# Patient Record
Sex: Male | Born: 1955 | Race: White | Hispanic: No | Smoking: Former smoker
Health system: Southern US, Community
[De-identification: ages and names within clinical notes are randomized; demographics above are authoritative.]

## PROBLEM LIST (undated history)

## (undated) ENCOUNTER — Emergency Department: Admission: EM | Payer: Medicare Other | Source: Home / Self Care

## (undated) DIAGNOSIS — Z955 Presence of coronary angioplasty implant and graft: Secondary | ICD-10-CM

## (undated) DIAGNOSIS — N289 Disorder of kidney and ureter, unspecified: Secondary | ICD-10-CM

## (undated) DIAGNOSIS — Z9889 Other specified postprocedural states: Secondary | ICD-10-CM

## (undated) DIAGNOSIS — E785 Hyperlipidemia, unspecified: Secondary | ICD-10-CM

## (undated) DIAGNOSIS — F32A Depression, unspecified: Secondary | ICD-10-CM

## (undated) DIAGNOSIS — I10 Essential (primary) hypertension: Secondary | ICD-10-CM

## (undated) DIAGNOSIS — I219 Acute myocardial infarction, unspecified: Secondary | ICD-10-CM

## (undated) HISTORY — PX: LIVER TRANSPLANT: SHX410

---

## 2003-02-20 ENCOUNTER — Other Ambulatory Visit: Payer: Self-pay

## 2005-08-11 ENCOUNTER — Ambulatory Visit: Payer: Self-pay | Admitting: Gastroenterology

## 2008-03-30 ENCOUNTER — Ambulatory Visit: Payer: Self-pay | Admitting: Gastroenterology

## 2009-06-02 ENCOUNTER — Ambulatory Visit: Payer: Self-pay | Admitting: Family Medicine

## 2009-06-16 ENCOUNTER — Ambulatory Visit: Payer: Self-pay | Admitting: Family Medicine

## 2012-12-24 ENCOUNTER — Ambulatory Visit: Payer: Self-pay | Admitting: Family Medicine

## 2013-10-21 ENCOUNTER — Ambulatory Visit: Payer: Self-pay | Admitting: Family Medicine

## 2014-05-19 ENCOUNTER — Ambulatory Visit: Payer: Self-pay | Admitting: Gastroenterology

## 2014-08-03 LAB — SURGICAL PATHOLOGY

## 2018-11-19 ENCOUNTER — Ambulatory Visit: Payer: BLUE CROSS/BLUE SHIELD

## 2018-11-19 DIAGNOSIS — Z96642 Presence of left artificial hip joint: Secondary | ICD-10-CM

## 2018-11-19 DIAGNOSIS — S42224A 2-part nondisplaced fracture of surgical neck of right humerus, initial encounter for closed fracture: Secondary | ICD-10-CM

## 2018-11-19 NOTE — Progress Notes
Date: 11/19/2018  Name: Travis Sherman   MRN#: [id number]   DOB: 1955-10-31   Age: 63 y.o.      Dr. Leane Call, DO    The patient was seen at the [facility] office.    CHIEF COMPLAINT: Status-post Left Hemiarthroplasty.     INTERIM HISTORY: Travis Sherman presents approximately 2 weeks post-operatively. his pain level today is a 3/10 in severity, intermittent in frequency, and dull in quality. He feels 90% improved compared to before. The patient denies fevers, chills or systemic complaints. The patient states they are taking their narcotics to control their pain. The patient is accompanied by his wife on today's visit.    Of note, the patient states he is scheduled bladder surgery on November 26, 2018.     ROS:  A COVID-19 questionnaire was filled out by the patient prior to the visit that included questions about having had a positive COVID-19 diagnosis in the last 14 days, contact with anybody diagnosed with  COVID-19 in the last 14 days, fever, headaches, unexplained muscle pain, weakness, diarrhea, nausea, vomiting, abdominal pain, respiratory illness/cough, shortness of breath, loss of smell, loss of taste, rash, skin irritation, unexplained hemorrhage, and fatigue. The responses to those questions were negative. A temperature was taken and it was less than 100 F.      PHYSICAL EXAM:  Constitutional: Travis Sherman is a 63 y.o. male in no acute distress.   Psyc: he is alert and oriented x3 with a normal mood and affect.  HENT: AT/NC; hearing intact  Eyes: PERRLA. Extra-ocular movements are intact.  Skin: Normal temperature. There are no rashes, ulcerations, or lesions.  Lymphatic: No induration or enlargement.  Lower Extremities:Mild Left lower extremity swelling. There is no calf tenderness with palpation. No clubbing. No cyanosis.  Gait:  []  Antalgic [x]  Normal []  Trendelenburg   Assistive devices: wheelchair    HIP EXAM   Left Hip Exam:   Surgical incision is healing well. There is no drainage, or fluid collection. Balance disturbance: []  Yes [x]  No  Snapping or clicking: []  Yes [x]  No  Erythema present: []  Yes [x]  No   Ecchymosis present: []  Yes [x]  No  Swelling present: []  Yes [x]  No  Palpable masses: []  Yes [x]  No   TTP: There is mild tenderness to palpation over the incision.     ROM(degrees/pain): There is no pain with ROM unless marked below.  Flexion  110???   []  with pain laterally. []  with pain in the groin.  Extension 0???    []  with pain laterally. []  with pain in the groin.  Abduction 30???  []  with pain laterally. []  with pain in the groin.  Adduction 20???  []  with pain laterally. []  with pain in the groin.  Internal rotation 15???  []  with pain laterally. []  with pain in the groin.  External rotation 35??? []  with pain laterally. []  with pain in the groin.  Motor Strength:   Hip flexion 4+/5  Hip extension 5/5   Leg abductors (Superior gluteal (L4-S1)) 4+/5  Knee extension (Femoral (L2-L4)) 5/5  Knee Flexion (Sciatic (L3-L4))  5/5  Neurovascular:   Sensation is normal throughout  Upper anterior thigh (L2) Normal  Lateral thigh (L2) Normal  Mid anterior thigh (L3) Normal  Knee, inside of leg (L4) Normal  Dorsum of foot (L5) Normal  Lateral border of foot (S1) Normal  Straight leg test:   []  Positive [x]  Negative []  Not Tested  Vascular   DP: Palpable  PT:  Palpable  Special Tests:   Impingement (FADDIR): []  Positive [x]  Negative []  Not Tested  Trendelenburg sign:  []  Positive [x]  Negative []  Not Tested    Radiographs:   Ap pelvis and two views of the left hip were ordered, taken, and interpreted by me from an orthopedic standpoint today. They reveal excellent alignment of components with no signs of loosening or other abnormalities.      IMPRESSION:    Status-post Left Hemiarthroplasty.     PLAN:   The patient is doing okay. his dressing was removed today. he can shower as normal but is to avoid any baths or soaks for the next four weeks. Daily ROM and strengthening exercises were encouraged today. he is to continue taking Aspirin for the 2 weeks. he is to continue anti-inflammatory medication, and no refill of their narcotic was given today. They are to wean off the narcotics.  Hip dislocation precautions were reinforced. The patient is encouraged to walk as much as tolerated. Antibiotic prophylaxis was strongly enforced.    He notes he is scheduled for bladder surgery in the next week.     Of note patient the patient was advised to follow up with Dr. Deloria Lair for his shoulders.     FOLLOW-UP: Return to clinic in 4 weeks with Dr. Leane Call for re-evaluation.    Thank you for allowing me to participate in Travis Sherman???s care. Please do not hesitate to contact me if you have any questions or concerns.         Leane Call, D.O.  Adult Reconstruction Specialist  ORTHOPEDIC SURGERY  11/19/2018

## 2018-11-20 ENCOUNTER — Ambulatory Visit: Payer: BLUE CROSS/BLUE SHIELD | Attending: Surgical

## 2018-11-20 DIAGNOSIS — S42291A Other displaced fracture of upper end of right humerus, initial encounter for closed fracture: Secondary | ICD-10-CM

## 2018-11-20 DIAGNOSIS — M25511 Pain in right shoulder: Secondary | ICD-10-CM

## 2018-11-20 NOTE — Progress Notes
DATE: 11/20/2018   NAME: Travis Sherman   MRN: [id number]   DOB: 1955/07/21   AGE: 63 y.o.       CHIEF COMPLAINT:   Chief Complaint   Patient presents with   ??? Right Shoulder - Pain       REFERRING PHYSICIAN: Maureen Ralphs, MD     HISTORY PRESENT ILLNESS: The patient is a 63 y.o. year old male who presents to the office for an evaluation of his right shoulder.  The patient fell on October 31, 2018. He was diagnosed with a left hip fracture and right shoulder fracture.  Surgery for the left hip was performed while he was at the hospital.  Non operative treatment for the right shoulder was initially recommended.  He has been using a sling for support.  He describes his shoulder pain as sharp and rates the severity of pain 6/10.  He has increased pain with reaching or lifting and associated popping.  He is trying to manage his symptoms with tramadol.    The patient does have comorbidities.  He has unstable sugar levels.  He is pending surgery for his prostate/bladder scheduled for next week on November 26, 2018. Also as mentioned above he is recovering from a left hemiarthroplasty following his injury on October 31, 2018. He is accompanied by his wife on today's visit.    ROS:    A COVID-19 questionnaire was filled out by the patient prior to the visit that included questions about having had a positive COVID-19 diagnosis in the last 14 days, contact with anybody diagnosed with  COVID-19 in the last 14 days, fever, headaches, unexplained muscle pain, weakness, diarrhea, nausea, vomiting, abdominal pain, respiratory illness/cough, shortness of breath, loss of smell, loss of taste, rash, skin irritation, unexplained hemorrhage and fatigue. The responses to those questions were negative. A temperature was taken and it was less than 100 F.      PREVIOUS MEDICAL HISTORY:   Past Medical History:   Diagnosis Date   ??? Diabetes mellitus (HCC/RAF)        PREVIOUS SURGICAL HISTORY:   Past Surgical History:   Procedure Laterality Date ??? heart fibulator     ??? heart stent     ??? SHOULDER SURGERY         MEDICATIONS:     Current Outpatient Medications:   ???  apixaban 5 mg tablet, 5 mg., Disp: , Rfl:   ???  aspirin (ASPIRIN 81) 81 mg EC tablet, 81 mg., Disp: , Rfl:   ???  atorvastatin 20 mg tablet, 20 mg., Disp: , Rfl:   ???  carvedilol 3.125 mg tablet, 3.125 mg., Disp: , Rfl:   ???  Docusate Sodium (COLACE PO), 50 mg., Disp: , Rfl:   ???  insulin glargine, 1 Unit Dial, (TOUJEO SOLOSTAR) 300 units/mL injection pen,  34 Unit, INJ, SUBCUT, qDay, Qty: 4.5 mL, 0, (3 pens x 1.5 ml), Maintenance, Disp: , Rfl:   ???  insulin glulisine (APIDRA) 100 units/mL injection vial,  10 Unit, INJ, SUBCUT, TID before meals, Qty: 10 mL, 0, Maintenance, Disp: , Rfl:   ???  levothyroxine 150 mcg tablet, 150 mcg., Disp: , Rfl:   ???  metFORMIN 1000 mg tablet, 1,000 mg., Disp: , Rfl:   ???  metOLazone 5 mg tablet, 5 mg., Disp: , Rfl:   ???  oxymetazoline 0.05% nasal spray, Spray 2 sprays in each nare two (2) times daily., Disp: , Rfl:   ???  TAMSULOSIN HCL PO, 0.4 mg.,  Disp: , Rfl:   ???  furosemide 40 mg tablet, take 1 tablet by mouth twice a day, Disp: , Rfl:   ???  potassium chloride 10 mEq CR tablet, take 1 tablet by mouth once daily, Disp: , Rfl:   ???  spironolactone 25 mg tablet, Take 1 tablet (25 mg) by mouth daily with food, Disp: , Rfl:     ALLERGIES: No Known Allergies    SOCIAL HISTORY:   Social History     Tobacco Use   ??? Smoking status: Current Every Day Smoker   ??? Smokeless tobacco: Never Used   Substance Use Topics   ??? Alcohol use: Yes     Comment: occasional   ??? Drug use: Not on file       FAMILY HISTORY:   Family History   Problem Relation Age of Onset   ??? Hypertension Mother    ??? Cancer Father        REVIEW OF SYSTEMS: The 14-point review of systems as documented today in the medical record is remarkable for the positive orthopedic problems discussed above and their relevance was considered with respect to Constitutional, ENT, Cardiovascular, GU, Skin, Neurologic, Endocrine, Hematologic, Psychiatric, Gastrointestinal, Respiratory, Eyes and Allergic/Immunologic systems.    EXAMINATION: Ht 6' 1'' (1.854 m)  ~ Wt 168 lb (76.2 kg)  ~ BMI 22.16 kg/m???      GENERAL: Alert, oriented, in no acute distress     PSYCH: Normal affect and mood    EENT: External exam of the eyes, ears, nose and mouth reveals no deformities, scars or lesions    PULMONARY: Respirations are regular and unlabored. Respiratory effort is normal with no evidence of intercostal retraction, or excessive use of accessory muscles    MUSCULOSKELATAL:    Right Shoulder:  Inspection:  []  Normal   []  Surgical scars []  Traumatic scars []  Arthroscopic scars   []  Symmetric shoulders []  Asymmetric shoulders   []  Biceps retracted   []  Swelling  [x]  Bruising []  AC joint deformity  []  Deltoid atrophy []  Supraspinatus fossa atrophy        []  Infraspinatus atrophy      Palpation: []   No tenderness [x]  Diffuse tenderness to palpation  []  AC Joint [] SC join                          []  Subacromial space   []  Anterior shoulder []  Posterior shoulder       []  Biceps tendon   []  Trapezius   []  Scapula/Periscapular  []  Warmth   []  Bogginess           []  Fluctuance                   []  AC joint ballottable      Range of motion exam:  Not tested  Forward elevation   ? Pain []  Present [x]  Absent     Abduction  ?  Pain []  Present [x]  Absent     External rotation at side  ?  Pain []  Present [x]  Absent     Internal rotation   at spinal level  Pain []  Present [x]  Absent    Crepitus is         []  Present [x]  Absent    External rotation @ 90?:  Not tested.   Internal rotation @ 90?: Not tested.  GIRD: Not tested.     Scapular Thoracic Motion:    [x]  Normal                      []   Medial winging                 []  Elevation with ROM   []  Asymmetric         Strength: []  Not tested secondary to pain  [x]  Not tested.   External rotation at side:  []  3/5 []  4-/5 [] 4/5 []  4+/5 []  5/5 []  with pain   Deltoid:  []  3/5 []  4-/5 [] 4/5 []  4+/5 []  5/5 []  with pain Subscap (Belly Press): []  3/5 []  4-/5 [] 4/5 []  4+/5 []  5/5 []  with pain    []  Drop arm                 []  External Rotation Lag          Provocative Tests:   []  PROVOCATIVE NOT TESTED DUE TO PAIN    Impingement:  Neer [] Positive [] Negative  [x] Not Tested  Hawkins [] Positive [] Negative  [x] Not Tested  Cross arm Adduction [] Positive [] Negative  [x] Not Tested   Speeds    [] Positive [] Negative  [x] Not Tested     Instability/Labrum:  DLS:    [] Positive [] Negative  [x] Not Tested   Active compression [] Positive [] Negative  [x] Not Tested      Load shift: [] Ant  [] Post [] Positive [] Negative  [x] Not Tested   Posterior stress [] Positive [] Negative  [x] Not Tested  Sulcus    [] Positive [] Negative  [x] Not Tested  Apprehension/Relocation [] Positive [] Negative  [x] Not Tested  Anterior/posterior drawer:  []  1+ []  2+ []  3+ []  Not Tested             NEUROLOGIC: No gross motor or sensory deficits     VASCULAR: Radial pulses are intact    SKIN: No rashes or lesions    RADIOGRAPHS:  Radiographs of the right shoulder including AP axillary outlet and acromioclavicular joint views ordered performed reviewed today in clinic.  These show a mildly displaced proximal humerus fracture across the surgical neck.    IMPRESSION:   1. Right shoulder proximal humerus fracture    MEDICAL DECISION MAKING: An extensive amount of time was spent reviewing radiographs, discussing diagnosis and treatment options with the patient and his wife.  His injury was October 31, 2018, approximately 3 weeks ago.  There is mild displacement of the proximal humerus fracture.  We will proceed with conservative treatment for the right shoulder fracture.  We discussed risks of nonunion and potential need for surgery.  He has multiple comorbidities, he is scheduled for prostate/bladder surgery scheduled for next week on November 26, 2018, and is recovering from a left hemiarthroplasty following a fracture. 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  He is concerned for the healing process of the right shoulder.  We discussed his options for surgery and non operative treatment.  The patient and his wife have agreed that the prostate/bladder surgery is priority at this time therefore we will proceed with conservative treatment on the right shoulder.      All of the patient???s questions and concerns were addressed.       FOLLOW UP: The patient will follow up in 7-10 days with new x-rays of the right shoulder, sooner as needed.  We will likely begin physical therapy after his follow-up appointment           The patient was examined and evaluated by Weldon Picking, PA-C for Hope Budds M.D. The evaluation and plan was reviewed and approved  by Hope Budds M.D.          _________________________________  Weldon Picking, PA-C  Hope Budds M.D.  ORTHOPEDIC SURGERY              CC: Maureen Ralphs, MD

## 2018-11-27 NOTE — Progress Notes
DATE: 12/02/2018   NAME: Travis Sherman   MRN: [id number]   DOB: 31-Oct-1955   AGE: 63 y.o.       Reason for visit: follow-up right shoulder    Interim History:  The patient returns today for reevaluation of the right shoulder. We are currently treating him conservatively for right shoulder proximal humerus fracture from an injury on October 31, 2018. At the time of the his injury he also fractured his hip and was treated with a hemiarthroplasty.  He states he is doing well in regards to the hip recovery.    We decided to proceed with conservative treatment and observation of the right proximal humerus fracture as he was pending surgery on his bladder/prostate.  Surgery was canceled due to hypertension and sepsis.  He was admitted to the hospital and treated with antibiotics.  He is likely going to proceed with the bladder/prostate surgery this week.    He continues to report right shoulder discomfort.      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  He is interested in his right shoulder surgical options    ROS: A COVID-19 questionnaire was filled out by the patient prior to the visit that included questions about having had a positive COVID-19 diagnosis in the last 14 days, contact with anybody diagnosed with  COVID-19 in the last 14 days, fever, headaches, unexplained muscle pain, weakness, diarrhea, nausea, vomiting, abdominal pain, respiratory illness/cough, shortness of breath, loss of smell, loss of taste, rash, skin irritation, unexplained hemorrhage and fatigue. The responses to those questions were negative. A temperature was taken and it was less than 100 F.    EXAMINATION:   There were no vitals taken for this visit.     GENERAL: Alert, oriented, in no acute distress     PSYCH: Normal affect and mood    EENT: External exam of the eyes, ears, nose and mouth reveals no deformities, scars or lesions PULMONARY: Respirations are regular and unlabored. Respiratory effort is normal with no evidence of intercostal retraction, or excessive use of accessory muscles    Right Shoulder:  Inspection:  [] ? Normal   [] ? Surgical scars                    [] ? Traumatic scars                 [] ? Arthroscopic scars   [] ? Symmetric shoulders         [] ? Asymmetric shoulders       [] ? Biceps retracted   [] ? Swelling                             [x] ? Bruising                             [] ? AC joint deformity  [] ? Deltoid atrophy                  [] ? Supraspinatus fossa atrophy          [] ? Infraspinatus atrophy    ???  Palpation: [] ?  No tenderness           [x] ? Diffuse tenderness to palpation  [] ? AC Joint                             [] ?  SC join                          [] ? Subacromial space   [] ? Anterior shoulder               [] ? Posterior shoulder       [] ? Biceps tendon   [] ? Trapezius                          [] ? Scapula/Periscapular  [] ? Warmth   [] ? Bogginess                         [] ? Fluctuance                   [] ? AC joint ballottable    ???  Range of motion exam:  Not tested  Forward elevation   ???                             Pain       [] ? Present  [x] ? Absent                                 Abduction  ???                                           Pain       [] ? Present  [x] ? Absent                      External rotation at side  ???                     Pain       [] ? Present  [x] ? Absent                                 Internal rotation   at spinal level             Pain       [] ? Present  [x] ? Absent  ???  Crepitus is         [] ? Present                  [x] ? Absent  ???  External rotation @ 90???:  Not tested.   Internal rotation @ 90???: Not tested.  GIRD: Not tested.   ???  Scapular Thoracic Motion:    [x] ? Normal                      [] ? Medial winging                 [] ? Elevation with ROM   [] ? Asymmetric       ???  Strength: [] ? Not tested secondary to pain  [x] ? Not tested. External rotation at side:         [] ? 3/5 [] ? 4-/5 [] ?4/5 [] ? 4+/5 [] ? 5/5 [] ? with pain   Deltoid:                                   [] ?  3/5 [] ? 4-/5 [] ?4/5 [] ? 4+/5 [] ? 5/5 [] ? with pain  Subscap [facility]):           [] ? 3/5 [] ? 4-/5 [] ?4/5 [] ? 4+/5 [] ? 5/5 [] ? with pain  ???  [] ? Drop arm                 [] ? External Rotation Lag        ???  Provocative Tests:   [] ? PROVOCATIVE NOT TESTED DUE TO PAIN    Impingement:  Neer                                        [] ?Positive       [] ?Negative     [x] ?Not Tested  Hawkins                                  [] ?Positive       [] ?Negative     [x] ?Not Tested  Cross arm Adduction              [] ?Positive       [] ?Negative     [x] ?Not Tested    Speeds                                    [] ?Positive       [] ?Negative     [x] ?Not Tested    ???  Instability/Labrum:  DLS:                                        [] ?Positive       [] ?Negative     [x] ?Not Tested   Active compression                [] ?Positive       [] ?Negative     [x] ?Not Tested      Load shift: [] ?Ant  [] ?Post      [] ?Positive       [] ?Negative     [x] ?Not Tested    Posterior stress                       [] ?Positive       [] ?Negative     [x] ?Not Tested  Sulcus                                     [] ?Positive       [] ?Negative     [x] ?Not Tested  Apprehension/Relocation       [] ?Positive       [] ?Negative     [x] ?Not Tested  Anterior/posterior drawer:       [] ? 1+   [] ? 2+   [] ? 3+   [] ? Not Tested           ???  NEUROLOGIC: No gross motor or sensory deficits    ???  VASCULAR: Radial pulses are intact  ???  SKIN: No rashes or lesions      X-Rays: Radiographs of the right shoulder including AP axillary outlet and acromioclavicular joint views ordered performed reviewed today in clinic.  These show a displaced proximal humerus fracture.  The fracture is further displaced in comparison to previous x-rays      Impression:  1. Right shoulder proximal humerus fracture, displaced Plan:  He I reviewed the diagnosis and treatment options with the patient and his wife.  He unfortunately has a displaced proximal humerus fracture which has further displaced in comparison to previous x-rays.  We discussed her options for surgery with open reduction internal fixation.  Unfortunately the patient is pending bladder/prostate surgery and was currently in the hospital for sepsis and hypertension.  The patient will need to have appropriate medical clearance.  The patient is expected to proceed with the bladder/prostate surgery this week and at this point this is priority.    We will proceed with right shoulder open reduction internal fixation of the proximal humerus fracture he after his bladder surgery and pending appropriate medical clearance.  We discussed risks, benefits, and recovery of this type of surgery.  All of his questions have been answered.    Follow-up at his preoperative appointment            _____________________   Shawnie Dapper. Nile Riggs, M.D.  Sports Medicine/Shoulder and Elbow Reconstuction  Orthopedic Surgery                _________________________________  Weldon Picking, PA-C  Hope Budds M.D.  ORTHOPEDIC SURGERY              CC: Maureen Ralphs, MD

## 2018-12-02 ENCOUNTER — Ambulatory Visit: Payer: BLUE CROSS/BLUE SHIELD | Attending: Surgical

## 2018-12-02 DIAGNOSIS — S42291A Other displaced fracture of upper end of right humerus, initial encounter for closed fracture: Secondary | ICD-10-CM

## 2018-12-24 ENCOUNTER — Ambulatory Visit: Payer: BLUE CROSS/BLUE SHIELD | Attending: Medical

## 2018-12-30 ENCOUNTER — Ambulatory Visit: Payer: BLUE CROSS/BLUE SHIELD | Attending: Medical

## 2019-01-10 ENCOUNTER — Ambulatory Visit: Payer: BLUE CROSS/BLUE SHIELD | Attending: Medical

## 2019-01-21 ENCOUNTER — Ambulatory Visit: Payer: BLUE CROSS/BLUE SHIELD | Attending: Medical

## 2019-01-22 NOTE — Progress Notes
Date: 01/21/2019  Name: Karlis Cregg   MRN#: [id number]   DOB: November 30, 1955   Age: 63 y.o.      Dr. Leane Call, DO    The patient was seen at the [facility] office.    CHIEF COMPLAINT: Status-post Left Hemiarthroplasty.     INTERIM HISTORY: Russel Morain presents approximately 2 weeks post-operatively. his pain level today is a 0/10 in severity, intermittent in frequency, and dull in quality. He feels 100% improved compared to before. The patient denies fevers, chills or systemic complaints. The patient states they are not taking their narcotics to control their pain. The patient is accompanied by his wife on today's visit.      ROS:  A COVID-19 questionnaire was filled out by the patient prior to the visit that included questions about having had a positive COVID-19 diagnosis in the last 14 days, contact with anybody diagnosed with  COVID-19 in the last 14 days, fever, headaches, unexplained muscle pain, weakness, diarrhea, nausea, vomiting, abdominal pain, respiratory illness/cough, shortness of breath, loss of smell, loss of taste, rash, skin irritation, unexplained hemorrhage, and fatigue. The responses to those questions were negative. A temperature was taken and it was less than 100 F.      PHYSICAL EXAM:  Constitutional: Feliz Lincoln is a 63 y.o. male in no acute distress.   Psyc: he is alert and oriented x3 with a normal mood and affect.  HENT: AT/NC; hearing intact  Eyes: PERRLA. Extra-ocular movements are intact.  Skin: Normal temperature. There are no rashes, ulcerations, or lesions.  Lymphatic: No induration or enlargement.  Lower Extremities:Mild Left lower extremity swelling. There is no calf tenderness with palpation. No clubbing. No cyanosis.  Gait:  []  Antalgic [x]  Normal []  Trendelenburg   Assistive devices: wheelchair    HIP EXAM   Left Hip Exam:   Surgical incision is healing well. There is no drainage, or fluid collection.   Balance disturbance: []  Yes [x]  No  Snapping or clicking: []  Yes [x]  No Erythema present: []  Yes [x]  No   Ecchymosis present: []  Yes [x]  No  Swelling present: []  Yes [x]  No  Palpable masses: []  Yes [x]  No   TTP: There is mild tenderness to palpation over the incision.     ROM(degrees/pain): There is no pain with ROM unless marked below.  Flexion  110?   []  with pain laterally. []  with pain in the groin.  Extension 0?    []  with pain laterally. []  with pain in the groin.  Abduction 30?  []  with pain laterally. []  with pain in the groin.  Adduction 20?  []  with pain laterally. []  with pain in the groin.  Internal rotation 15?  []  with pain laterally. []  with pain in the groin.  External rotation 35? []  with pain laterally. []  with pain in the groin.  Motor Strength:   Hip flexion 4+/5  Hip extension 5/5   Leg abductors (Superior gluteal (L4-S1)) 4+/5  Knee extension (Femoral (L2-L4)) 5/5  Knee Flexion (Sciatic (L3-L4))  5/5  Neurovascular:   Sensation is normal throughout  Upper anterior thigh (L2) Normal  Lateral thigh (L2) Normal  Mid anterior thigh (L3) Normal  Knee, inside of leg (L4) Normal  Dorsum of foot (L5) Normal  Lateral border of foot (S1) Normal  Straight leg test:   []  Positive [x]  Negative []  Not Tested  Vascular   DP: Palpable  PT: Palpable  Special Tests:   Impingement (FADDIR): []  Positive [x]  Negative []  Not  Tested  Trendelenburg sign:  []  Positive [x]  Negative []  Not Tested    Radiographs:   Not obtained today.     IMPRESSION:    Status-post Left Hemiarthroplasty.     PLAN:   The patient is doing well.  Daily ROM and strengthening exercises were encouraged today.     Hip dislocation precautions were reinforced. The patient is encouraged to walk as much as tolerated. Antibiotic prophylaxis was strongly enforced.    Of note patient the patient was advised to follow up with his shoulder specialist.     FOLLOW-UP: Return to clinic in 1 year with Dr. Leane Call for re-evaluation and yearly hip x rays Thank you for allowing me to participate in Ramar Slatten?s care. Please do not hesitate to contact me if you have any questions or concerns.         Leane Call, D.O.  Adult Reconstruction Specialist  ORTHOPEDIC SURGERY  01/21/2019

## 2019-05-18 ENCOUNTER — Ambulatory Visit: Payer: BLUE CROSS/BLUE SHIELD

## 2019-05-18 DIAGNOSIS — Z23 Encounter for immunization: Secondary | ICD-10-CM

## 2019-10-11 ENCOUNTER — Emergency Department
Admission: EM | Admit: 2019-10-11 | Discharge: 2019-10-11 | Disposition: A | Discharge status: Home / Self Care | Payer: Medicare Other | Attending: Emergency Medicine | Admitting: Emergency Medicine

## 2019-10-11 ENCOUNTER — Emergency Department: Payer: Medicare Other

## 2019-10-11 ENCOUNTER — Other Ambulatory Visit: Payer: Self-pay

## 2019-10-11 ENCOUNTER — Encounter: Payer: Self-pay | Admitting: *Deleted

## 2019-10-11 DIAGNOSIS — Y929 Unspecified place or not applicable: Secondary | ICD-10-CM | POA: Insufficient documentation

## 2019-10-11 DIAGNOSIS — F17200 Nicotine dependence, unspecified, uncomplicated: Secondary | ICD-10-CM | POA: Insufficient documentation

## 2019-10-11 DIAGNOSIS — S42352A Displaced comminuted fracture of shaft of humerus, left arm, initial encounter for closed fracture: Secondary | ICD-10-CM

## 2019-10-11 DIAGNOSIS — W19XXXA Unspecified fall, initial encounter: Secondary | ICD-10-CM

## 2019-10-11 DIAGNOSIS — I10 Essential (primary) hypertension: Secondary | ICD-10-CM | POA: Insufficient documentation

## 2019-10-11 DIAGNOSIS — Z79899 Other long term (current) drug therapy: Secondary | ICD-10-CM | POA: Diagnosis not present

## 2019-10-11 DIAGNOSIS — R270 Ataxia, unspecified: Secondary | ICD-10-CM | POA: Insufficient documentation

## 2019-10-11 DIAGNOSIS — S4992XA Unspecified injury of left shoulder and upper arm, initial encounter: Secondary | ICD-10-CM | POA: Diagnosis present

## 2019-10-11 DIAGNOSIS — Y999 Unspecified external cause status: Secondary | ICD-10-CM | POA: Insufficient documentation

## 2019-10-11 DIAGNOSIS — Z7982 Long term (current) use of aspirin: Secondary | ICD-10-CM | POA: Insufficient documentation

## 2019-10-11 DIAGNOSIS — Y939 Activity, unspecified: Secondary | ICD-10-CM | POA: Insufficient documentation

## 2019-10-11 HISTORY — DX: Hyperlipidemia, unspecified: E78.5

## 2019-10-11 HISTORY — DX: Acute myocardial infarction, unspecified: I21.9

## 2019-10-11 HISTORY — DX: Other specified postprocedural states: Z98.890

## 2019-10-11 HISTORY — DX: Presence of coronary angioplasty implant and graft: Z95.5

## 2019-10-11 HISTORY — DX: Depression, unspecified: F32.A

## 2019-10-11 HISTORY — DX: Essential (primary) hypertension: I10

## 2019-10-11 MED ORDER — OXYCODONE-ACETAMINOPHEN 5-325 MG PO TABS: 1.0000 | ORAL_TABLET | ORAL | 0 refills | Status: DC | PRN

## 2019-10-11 NOTE — ED Provider Notes (Addendum)
[facility] Emergency Department Provider Note   ____________________________________________   First MD Initiated Contact with Patient 10/11/19 1338     (approximate)  I have reviewed the triage vital signs and the nursing notes.   HISTORY  Chief Complaint Fall    HPI Alex Hampton is a 64 y.o. male patient arrived via EMS secondary to mechanical fall this morning.  Patient states he has been having increased balance problems with standing and ambulation.  Patient complaint of pain to the left upper arm.  Patient denies loss of sensation.  Patient denies headache or LOC.         Past Medical History:  Diagnosis Date  . Depression   . History of cardiac cath   . History of heart artery stent   . Hyperlipidemia   . Hypertension   . MI (myocardial infarction) ([place])     There are no problems to display for this patient.   History reviewed. No pertinent surgical history.  Prior to Admission medications   Medication Sig Start Date End Date Taking? Authorizing Provider  aspirin 81 MG EC tablet Take by mouth.   Yes [provider]  buPROPion (WELLBUTRIN XL) 150 MG 24 hr tablet Take by mouth. 09/18/18  Yes [provider]  ezetimibe-simvastatin (VYTORIN) 10-80 MG tablet TAKE 1 TABLET BY MOUTH EVERY DAY AT NIGHT 01/20/19  Yes [provider]  FLUoxetine (PROZAC) 10 MG capsule Take by mouth. 09/18/18  Yes [provider]  fluticasone (FLONASE) 50 MCG/ACT nasal spray SPRAY 2 SPRAYS INTO EACH NOSTRIL EVERY DAY 09/30/18  Yes [provider]  hydrocortisone 2.5 % lotion hydrocortisone 2.5 % lotion  APPLY TO AFFECTED AREA UP TO 3 TIMES A WEEK MONDAY,WEDNESDAY, AND FRIDAY   Yes [provider]  lisinopril (ZESTRIL) 10 MG tablet Take by mouth. 09/18/18  Yes [provider]  meloxicam (MOBIC) 15 MG tablet TAKE 1 TABLET BY MOUTH DAILY WITH MEALS 10/15/15  Yes [provider]  metoprolol  tartrate (LOPRESSOR) 100 MG tablet Take by mouth. 09/18/18  Yes [provider]  montelukast (SINGULAIR) 10 MG tablet Take 1 tablet by mouth daily. 01/20/19  Yes [provider]  traZODone (DESYREL) 150 MG tablet Take 75 mg by mouth at bedtime. 09/15/19  Yes [provider]  oxyCODONE-acetaminophen (PERCOCET) 5-325 MG tablet Take 1 tablet by mouth every 4 (four) hours as needed for severe pain. 10/11/19 10/10/20  Sable Feil, PA-C    Allergies Patient has no known allergies.  No family history on file.  Social History Social History   Tobacco Use  . Smoking status: Current Some Day Smoker  . Smokeless tobacco: Never Used  Substance Use Topics  . Alcohol use: Yes    Comment: occasionally  . Drug use: Never    Review of Systems Constitutional: No fever/chills Eyes: No visual changes. ENT: No sore throat. Cardiovascular: Denies chest pain. Respiratory: Denies shortness of breath. Gastrointestinal: No abdominal pain.  No nausea, no vomiting.  No diarrhea.  No constipation. Genitourinary: Negative for dysuria. Musculoskeletal: Left shoulder pain Skin: Negative for rash. Neurological: Negative for headaches, focal weakness or numbness. Psychiatric:  Depression Endocrine:  Hyperlipidemia and hypertension  ____________________________________________   PHYSICAL EXAM:  VITAL SIGNS: ED Triage Vitals  Enc Vitals Group     BP 10/11/19 1340 122/68     Pulse Rate 10/11/19 1340 (!) 54     Resp 10/11/19 1340 16     Temp 10/11/19 1340 97.8 F (  36.6 C)     Temp Source 10/11/19 1340 Oral     SpO2 10/11/19 1340 99 %     Weight 10/11/19 1341 232 lb (105.2 kg)     Height 10/11/19 1341 6\' 1"  (1.854 m)     Head Circumference --      Peak Flow --      Pain Score 10/11/19 1340 3     Pain Loc --      Pain Edu? --      Excl. in [place]? --     Constitutional: Alert and oriented. Well appearing and in no acute distress. Hematological/Lymphatic/Immunilogical: No  cervical lymphadenopathy. Cardiovascular: Bradycardic, regular rhythm. Grossly normal heart sounds.  Good peripheral circulation. Respiratory: Normal respiratory effort.  No retractions. Lungs CTAB. Musculoskeletal: No lower extremity tenderness nor edema.  No joint effusions. Neurologic:  Normal speech and language. No gross focal neurologic deficits are appreciated. No gait instability. Skin:  Skin is warm, dry and intact. No rash noted. Psychiatric: Mood and affect are normal. Speech and behavior are normal.  ____________________________________________   LABS (all labs ordered are listed, but only abnormal results are displayed)  Labs Reviewed - No data to display ____________________________________________  EKG EKG reviewed read by heart station Doctor With no acute findings.  ____________________________________________  RADIOLOGY  ED MD interpretation:    Official radiology report(s): CT Head Wo Contrast  Result Date: 10/11/2019 CLINICAL DATA:  Ataxia.  Assess for stroke. EXAM: CT HEAD WITHOUT CONTRAST TECHNIQUE: Contiguous axial images were obtained from the base of the skull through the vertex without intravenous contrast. COMPARISON:  None. FINDINGS: Brain: No evidence of acute infarction, hemorrhage, hydrocephalus, extra-axial collection or mass lesion/mass effect. Vascular: No hyperdense vessel is noted. Skull: Normal. Negative for fracture or focal lesion. Sinuses/Orbits: No acute finding. Other: None. IMPRESSION: No focal acute intracranial abnormality identified. Electronically Signed   By: Abelardo Diesel M.D.   On: 10/11/2019 14:44   DG Humerus Left  Result Date: 10/11/2019 CLINICAL DATA:  Status post fall this morning with left arm pain EXAM: LEFT HUMERUS - 2+ VIEW COMPARISON:  None. FINDINGS: Comminuted displaced fracture of the proximal and mid left humerus are noted. The visualized lung fields are clear. There is no dislocation. IMPRESSION: Comminuted displaced  fracture of the proximal and mid left humerus. Electronically Signed   By: Abelardo Diesel M.D.   On: 10/11/2019 14:46    ____________________________________________   PROCEDURES  Procedure(s) performed (including Critical Care):  Procedures   ____________________________________________   INITIAL IMPRESSION / ASSESSMENT AND PLAN / ED COURSE  As part of my medical decision making, I reviewed the following data within the [place]     Patient presents with left humeral pain secondary to mechanical fall.  Discussed x-ray/CT  Findings with patient revealing a comminuted fracture of the proximal and mid humerus.  Discussed x-ray findings with orthopedics recommend shoulder immobilizer and follow-up in orthopedic clinic.  Patient given discharge care instructions and will follow orthopedics.   Alex Hampton was evaluated in Emergency Department on 10/11/2019 for the symptoms described in the history of present illness. He was evaluated in the context of the global COVID-19 pandemic, which necessitated consideration that the patient might be at risk for infection with the SARS-CoV-2 virus that causes COVID-19. Institutional protocols and algorithms that pertain to the evaluation of patients at risk for COVID-19 are in a state of rapid change based on information released by regulatory bodies including the CDC and federal and  state organizations. These policies and algorithms were followed during the patient's care in the ED.       ____________________________________________   FINAL CLINICAL IMPRESSION(S) / ED DIAGNOSES  Final diagnoses:  Closed displaced comminuted fracture of shaft of left humerus, initial encounter     ED Discharge Orders         Ordered    oxyCODONE-acetaminophen (PERCOCET) 5-325 MG tablet  Every 4 hours PRN     Discontinue  Reprint     10/11/19 1543           Note:  This document was prepared using Dragon voice recognition software and  may include unintentional dictation errors.    Sable Feil, PA-C 10/11/19 1547    Sable Feil, PA-C 10/11/19 1550    Vanessa Anon Raices, MD 10/12/19 [phone]

## 2019-10-11 NOTE — ED Triage Notes (Addendum)
Per EMS report, patient had a mechanical fall this morning after tripping. Patient has a history of balance problems. Patient has a deformed humerus fracture. Patient denies LOC or other injuries. Patient was given 4mg  Zofran and 176mcg Fentanyl en route.

## 2019-10-11 NOTE — ED Provider Notes (Signed)
EKG is reviewed interpreted by me at 1203 Heart rate 50 QRS 99 QTc 460 Normal sinus rhythm, no evidence of acute ischemia or ectopy.   Delman Kitten, MD 10/11/19 1414

## 2019-10-11 NOTE — ED Notes (Signed)
Smart watch taken off left wrist and given to patient who put it in his pocket

## 2019-10-16 ENCOUNTER — Inpatient Hospital Stay
Admission: EM | Admit: 2019-10-16 | Discharge: 2019-10-23 | DRG: 057 | Disposition: A | Discharge status: Skilled Nursing Facility | Payer: Medicare Other | Attending: Internal Medicine | Admitting: Internal Medicine

## 2019-10-16 ENCOUNTER — Emergency Department: Payer: Medicare Other

## 2019-10-16 ENCOUNTER — Other Ambulatory Visit: Payer: Self-pay

## 2019-10-16 DIAGNOSIS — F419 Anxiety disorder, unspecified: Secondary | ICD-10-CM | POA: Diagnosis present

## 2019-10-16 DIAGNOSIS — I252 Old myocardial infarction: Secondary | ICD-10-CM

## 2019-10-16 DIAGNOSIS — Z9181 History of falling: Secondary | ICD-10-CM

## 2019-10-16 DIAGNOSIS — R410 Disorientation, unspecified: Secondary | ICD-10-CM | POA: Diagnosis not present

## 2019-10-16 DIAGNOSIS — G238 Other specified degenerative diseases of basal ganglia: Principal | ICD-10-CM | POA: Diagnosis present

## 2019-10-16 DIAGNOSIS — Z9119 Patient's noncompliance with other medical treatment and regimen: Secondary | ICD-10-CM

## 2019-10-16 DIAGNOSIS — R269 Unspecified abnormalities of gait and mobility: Secondary | ICD-10-CM

## 2019-10-16 DIAGNOSIS — F32A Depression, unspecified: Secondary | ICD-10-CM

## 2019-10-16 DIAGNOSIS — R748 Abnormal levels of other serum enzymes: Secondary | ICD-10-CM | POA: Diagnosis present

## 2019-10-16 DIAGNOSIS — S42422A Displaced comminuted supracondylar fracture without intercondylar fracture of left humerus, initial encounter for closed fracture: Secondary | ICD-10-CM | POA: Diagnosis present

## 2019-10-16 DIAGNOSIS — G629 Polyneuropathy, unspecified: Secondary | ICD-10-CM | POA: Diagnosis present

## 2019-10-16 DIAGNOSIS — R443 Hallucinations, unspecified: Secondary | ICD-10-CM

## 2019-10-16 DIAGNOSIS — Z79899 Other long term (current) drug therapy: Secondary | ICD-10-CM

## 2019-10-16 DIAGNOSIS — Z7982 Long term (current) use of aspirin: Secondary | ICD-10-CM

## 2019-10-16 DIAGNOSIS — G4733 Obstructive sleep apnea (adult) (pediatric): Secondary | ICD-10-CM

## 2019-10-16 DIAGNOSIS — K7460 Unspecified cirrhosis of liver: Secondary | ICD-10-CM

## 2019-10-16 DIAGNOSIS — R7989 Other specified abnormal findings of blood chemistry: Secondary | ICD-10-CM | POA: Diagnosis present

## 2019-10-16 DIAGNOSIS — W19XXXA Unspecified fall, initial encounter: Secondary | ICD-10-CM | POA: Diagnosis present

## 2019-10-16 DIAGNOSIS — Z20822 Contact with and (suspected) exposure to covid-19: Secondary | ICD-10-CM | POA: Diagnosis present

## 2019-10-16 DIAGNOSIS — R441 Visual hallucinations: Secondary | ICD-10-CM

## 2019-10-16 DIAGNOSIS — F329 Major depressive disorder, single episode, unspecified: Secondary | ICD-10-CM | POA: Diagnosis present

## 2019-10-16 DIAGNOSIS — D696 Thrombocytopenia, unspecified: Secondary | ICD-10-CM | POA: Diagnosis present

## 2019-10-16 DIAGNOSIS — M6289 Other specified disorders of muscle: Secondary | ICD-10-CM | POA: Diagnosis present

## 2019-10-16 DIAGNOSIS — S42425K Nondisplaced comminuted supracondylar fracture without intercondylar fracture of left humerus, subsequent encounter for fracture with nonunion: Secondary | ICD-10-CM

## 2019-10-16 DIAGNOSIS — I2510 Atherosclerotic heart disease of native coronary artery without angina pectoris: Secondary | ICD-10-CM | POA: Diagnosis present

## 2019-10-16 DIAGNOSIS — R55 Syncope and collapse: Secondary | ICD-10-CM

## 2019-10-16 DIAGNOSIS — I10 Essential (primary) hypertension: Secondary | ICD-10-CM

## 2019-10-16 DIAGNOSIS — R188 Other ascites: Secondary | ICD-10-CM | POA: Diagnosis present

## 2019-10-16 DIAGNOSIS — Z955 Presence of coronary angioplasty implant and graft: Secondary | ICD-10-CM

## 2019-10-16 DIAGNOSIS — F17200 Nicotine dependence, unspecified, uncomplicated: Secondary | ICD-10-CM | POA: Diagnosis present

## 2019-10-16 DIAGNOSIS — Z791 Long term (current) use of non-steroidal anti-inflammatories (NSAID): Secondary | ICD-10-CM

## 2019-10-16 DIAGNOSIS — R296 Repeated falls: Secondary | ICD-10-CM | POA: Diagnosis present

## 2019-10-16 DIAGNOSIS — E785 Hyperlipidemia, unspecified: Secondary | ICD-10-CM | POA: Diagnosis present

## 2019-10-16 DIAGNOSIS — R079 Chest pain, unspecified: Secondary | ICD-10-CM

## 2019-10-16 DIAGNOSIS — R791 Abnormal coagulation profile: Secondary | ICD-10-CM | POA: Diagnosis present

## 2019-10-16 LAB — COMPREHENSIVE METABOLIC PANEL
ALT: 37 U/L (ref 0–44)
AST: 81 U/L — ABNORMAL HIGH (ref 15–41)
Albumin: 2.7 g/dL — ABNORMAL LOW (ref 3.5–5.0)
Alkaline Phosphatase: 177 U/L — ABNORMAL HIGH (ref 38–126)
Anion gap: 9 (ref 5–15)
BUN: 13 mg/dL (ref 8–23)
CO2: 24 mmol/L (ref 22–32)
Calcium: 8.4 mg/dL — ABNORMAL LOW (ref 8.9–10.3)
Chloride: 100 mmol/L (ref 98–111)
Creatinine, Ser: 0.85 mg/dL (ref 0.61–1.24)
GFR calc Af Amer: >60 mL/min (ref >60)
GFR calc non Af Amer: >60 mL/min (ref >60)
Glucose, Bld: 118 mg/dL — ABNORMAL HIGH (ref 70–99)
Potassium: 4.7 mmol/L (ref 3.5–5.1)
Sodium: 133 mmol/L — ABNORMAL LOW (ref 135–145)
Total Bilirubin: 2.9 mg/dL — ABNORMAL HIGH (ref 0.3–1.2)
Total Protein: 6.2 g/dL — ABNORMAL LOW (ref 6.5–8.1)

## 2019-10-16 LAB — CBC WITH DIFFERENTIAL/PLATELET
Abs Immature Granulocytes: 0.05 10*3/uL (ref 0.00–0.07)
Basophils Absolute: 0.1 10*3/uL (ref 0.0–0.1)
Basophils Relative: 1 %
Eosinophils Absolute: 0.4 10*3/uL (ref 0.0–0.5)
Eosinophils Relative: 4 %
HCT: 40.7 % (ref 39.0–52.0)
Hemoglobin: 14.6 g/dL (ref 13.0–17.0)
Immature Granulocytes: 1 %
Lymphocytes Relative: 17 %
Lymphs Abs: 1.6 10*3/uL (ref 0.7–4.0)
MCH: 34.7 pg — ABNORMAL HIGH (ref 26.0–34.0)
MCHC: 35.9 g/dL (ref 30.0–36.0)
MCV: 96.7 fL (ref 80.0–100.0)
Monocytes Absolute: 0.7 10*3/uL (ref 0.1–1.0)
Monocytes Relative: 7 %
Neutro Abs: 6.7 10*3/uL (ref 1.7–7.7)
Neutrophils Relative %: 70 %
Platelets: 198 10*3/uL (ref 150–400)
RBC: 4.21 MIL/uL — ABNORMAL LOW (ref 4.22–5.81)
RDW: 13.4 % (ref 11.5–15.5)
WBC: 9.5 10*3/uL (ref 4.0–10.5)
nRBC: 0 % (ref 0.0–0.2)

## 2019-10-16 LAB — TROPONIN I (HIGH SENSITIVITY): Troponin I (High Sensitivity): 8 ng/L (ref <18)

## 2019-10-16 MED ORDER — OXYCODONE HCL 5 MG PO TABS
5.0000 mg | ORAL_TABLET | Freq: Once | ORAL | Status: AC
  Administered 2019-10-16: 5 mg via ORAL
  Filled 2019-10-16: qty 1

## 2019-10-16 MED ORDER — IOHEXOL 350 MG/ML SOLN
75.0000 mL | Freq: Once | INTRAVENOUS | Status: AC | PRN
  Administered 2019-10-16: 75 mL via INTRAVENOUS

## 2019-10-16 NOTE — ED Triage Notes (Signed)
Pt in via EMS from home with c/o SOB/CP. 98% RA; 60HR; 111 BG; 88/60 then 99/70 with EMS; fracture to L arm recently from fall; history MI with one stent. EMS attempted for IVx3. Pt's L arm in a sling; bruising noted.

## 2019-10-16 NOTE — ED Notes (Signed)
Pt denies diarrhea; states constipation lately.

## 2019-10-16 NOTE — ED Notes (Addendum)
Pt reports was sleeping when SOB woke him up. Reports lost his CPAP while in [place] recently. States currently feels much better. Resp reg/unlabored. 97% RA. Skin dry. Pt A&Ox4. Lav/grn/blue tubes sent to lab. EKG to EDP [place]. EDP [place] at bedside. Pt given warm blankets.

## 2019-10-16 NOTE — ED Provider Notes (Addendum)
[facility] Emergency Department Provider Note  ____________________________________________   First MD Initiated Contact with Patient 10/16/19 2154     (approximate)  I have reviewed the triage vital signs and the nursing notes.   HISTORY  Chief Complaint Chest Pain    HPI Alex Hampton is a 64 y.o. male with history of coronary disease, hypertension, hyperlipidemia, sleep apnea supposed be on CPAP who comes in with shortness of breath.  Patient stated he woke from sleep with severe shortness of breath, constant, nothing made it better, nothing made it worse.  So the shortness of breath is little bit better now but kind of comes and goes.  He denies any chest pain although according to EMS he was clutching his chest and was diaphoretic.  He denies any chest pain at this time.  He denies any leg swelling, abdominal pain.  He does report having a fall recently and since a fall having more confusion.  He is only on a baby aspirin.  He also reports that he broke his left arm.          Past Medical History:  Diagnosis Date  . Depression   . History of cardiac cath   . History of heart artery stent   . Hyperlipidemia   . Hypertension   . MI (myocardial infarction) ([place])     There are no problems to display for this patient.   History reviewed. No pertinent surgical history.  Prior to Admission medications   Medication Sig Start Date End Date Taking? Authorizing Provider  aspirin 81 MG EC tablet Take by mouth.    [provider]  buPROPion (WELLBUTRIN XL) 150 MG 24 hr tablet Take by mouth. 09/18/18   [provider]  ezetimibe-simvastatin (VYTORIN) 10-80 MG tablet TAKE 1 TABLET BY MOUTH EVERY DAY AT NIGHT 01/20/19   [provider]  FLUoxetine (PROZAC) 10 MG capsule Take by mouth. 09/18/18   [provider]  fluticasone (FLONASE) 50 MCG/ACT nasal spray SPRAY 2 SPRAYS INTO EACH NOSTRIL EVERY DAY 09/30/18   [provider]  hydrocortisone 2.5 % lotion hydrocortisone 2.5 % lotion  APPLY TO AFFECTED AREA UP TO 3 TIMES A WEEK MONDAY,WEDNESDAY, AND FRIDAY    [provider]  lisinopril (ZESTRIL) 10 MG tablet Take by mouth. 09/18/18   [provider]  meloxicam (MOBIC) 15 MG tablet TAKE 1 TABLET BY MOUTH DAILY WITH MEALS 10/15/15   [provider]  metoprolol tartrate (LOPRESSOR) 100 MG tablet Take by mouth. 09/18/18   [provider]  montelukast (SINGULAIR) 10 MG tablet Take 1 tablet by mouth daily. 01/20/19   [provider]  oxyCODONE-acetaminophen (PERCOCET) 5-325 MG tablet Take 1 tablet by mouth every 4 (four) hours as needed for severe pain. 10/11/19 10/10/20  Sable Feil, PA-C  traZODone (DESYREL) 150 MG tablet Take 75 mg by mouth at bedtime. 09/15/19   [provider]    Allergies Patient has no known allergies.  No family history on file.  Social History Social History   Tobacco Use  . Smoking status: Current Some Day Smoker  . Smokeless tobacco: Never Used  Substance Use Topics  . Alcohol use: Yes    Comment: occasionally  . Drug use: Never      Review of Systems Constitutional: No fever/chills Eyes: No visual changes. ENT: No sore throat. Cardiovascular: Denies chest pain Respiratory: Positive shortness of breath Gastrointestinal: No abdominal pain.  No nausea, no vomiting.  No  diarrhea.  No constipation. Genitourinary: Negative for dysuria. Musculoskeletal: Negative for back pain. Skin: Negative for rash. Neurological: Negative for headaches, focal weakness or numbness.  Positive confusion All other ROS negative ____________________________________________   PHYSICAL EXAM:  VITAL SIGNS: ED Triage Vitals  Enc Vitals Group     BP --      Pulse Rate 10/16/19 2140 (!) 54     Resp 10/16/19 2140 15     Temp 10/16/19 2147 97.8 F (36.6 C)     Temp Source 10/16/19 2147 Oral     SpO2 10/16/19 2140 97 %     Weight  10/16/19 2150 245 lb (111.1 kg)     Height 10/16/19 2150 6\' 1"  (1.854 m)     Head Circumference --      Peak Flow --      Pain Score 10/16/19 2148 7     Pain Loc --      Pain Edu? --      Excl. in [place]? --     Constitutional: Alert and oriented. Well appearing and in no acute distress. Eyes: Conjunctivae are normal. EOMI. Head: Atraumatic. Nose: No congestion/rhinnorhea. Mouth/Throat: Mucous membranes are moist.   Neck: No stridor. Trachea Midline. FROM Cardiovascular: Normal rate, regular rhythm. Grossly normal heart sounds.  Good peripheral circulation. Respiratory: Normal respiratory effort.  No retractions. Lungs CTAB. Gastrointestinal: Soft and nontender. No distention. No abdominal bruits.  Musculoskeletal: No lower extremity tenderness nor edema.  No joint effusions. Neurologic:  Normal speech and language. No gross focal neurologic deficits are appreciated.  Skin:  Skin is warm, dry and intact. No rash noted. Psychiatric: Mood and affect are normal. Speech and behavior are normal. GU: Deferred   ____________________________________________   LABS (all labs ordered are listed, but only abnormal results are displayed)  Labs Reviewed  CBC WITH DIFFERENTIAL/PLATELET - Abnormal; Notable for the following components:      Result Value   RBC 4.21 (*)    MCH 34.7 (*)    All other components within normal limits  COMPREHENSIVE METABOLIC PANEL - Abnormal; Notable for the following components:   Sodium 133 (*)    Glucose, Bld 118 (*)    Calcium 8.4 (*)    Total Protein 6.2 (*)    Albumin 2.7 (*)    AST 81 (*)    Alkaline Phosphatase 177 (*)    Total Bilirubin 2.9 (*)    All other components within normal limits  URINALYSIS, COMPLETE (UACMP) WITH MICROSCOPIC  TROPONIN I (HIGH SENSITIVITY)   ____________________________________________   ED ECG REPORT I, Vanessa Corona, the attending physician, personally viewed and interpreted this ECG.  Normal sinus rate of 60, no ST  elevation, no T wave inversions, normal intervals, QTC 47 ____________________________________________  RADIOLOGY   Official radiology report(s): CT Head Wo Contrast  Result Date: 10/16/2019 CLINICAL DATA:  Headache, acute, normal neuro exam EXAM: CT HEAD WITHOUT CONTRAST TECHNIQUE: Contiguous axial images were obtained from the base of the skull through the vertex without intravenous contrast. COMPARISON:  Head CT 5 days ago 10/11/2019 FINDINGS: Brain: No intracranial hemorrhage, mass effect, or midline shift. No hydrocephalus. The basilar cisterns are patent. No evidence of territorial infarct or acute ischemia. No extra-axial or intracranial fluid collection. Vascular: No hyperdense vessel or unexpected calcification. Skull: No fracture or focal lesion. Sinuses/Orbits: Paranasal sinuses and mastoid air cells are clear. The visualized orbits are unremarkable. Other: None. IMPRESSION: Negative noncontrast head CT. Electronically Signed   By: Aurther Loft.D.  On: 10/16/2019 23:21   CT Angio Chest PE W and/or Wo Contrast  Result Date: 10/16/2019 CLINICAL DATA:  Shortness of breath EXAM: CT ANGIOGRAPHY CHEST WITH CONTRAST TECHNIQUE: Multidetector CT imaging of the chest was performed using the standard protocol during bolus administration of intravenous contrast. Multiplanar CT image reconstructions and MIPs were obtained to evaluate the vascular anatomy. CONTRAST:  24mL OMNIPAQUE IOHEXOL 350 MG/ML SOLN COMPARISON:  Radiograph 10/16/2019 FINDINGS: Cardiovascular: Satisfactory opacification the pulmonary arteries to the segmental level. No pulmonary artery filling defects are identified. Central pulmonary arteries are normal caliber. Cardiomegaly with four-chamber enlargement. No pericardial effusion. Extensive coronary artery calcifications are present. Minimal plaque in the normal caliber thoracic aorta. No acute luminal abnormality. No periaortic stranding or hemorrhage. Normal 3 vessel branching.  Proximal great vessels are unremarkable aside from minimal plaque at the vertebral artery origins. Mediastinum/Nodes: No mediastinal fluid or gas. Normal thyroid gland and thoracic inlet. No acute abnormality of the trachea. Fluid-filled distal thoracic esophagus. No worrisome mediastinal or hilar adenopathy. There are extensive, asymmetric edematous changes centered upon the left shoulder and axillary soft tissues. No associated axillary adenopathy is evident. Lungs/Pleura: Low lung volumes with dependent atelectatic changes posteriorly. More bandlike areas of opacity are present in the lung bases, likely reflecting further atelectasis or scarring subpleural fat noted in both lung bases, right slightly greater than left. No pneumothorax or visible effusion. No suspicious pulmonary nodules or masses. Upper Abdomen: Moderate volume upper abdominal ascites with a nodular, heterogeneous liver surface contour as well as caudate and left lobe hypertrophy. Upper abdomen is otherwise unremarkable. Musculoskeletal: Soft tissue stranding and edematous changes of the left shoulder and axillary region, as above, likely secondary to recent humeral fracture, outside the level of imaging. No acute osseous or musculotendinous injury is identified within the field of view. Review of the MIP images confirms the above findings. IMPRESSION: 1. No acute pulmonary artery filling defects to suggest pulmonary embolism. 2. Extensive, asymmetric edematous changes centered upon the left shoulder and axillary soft tissues, likely secondary to recent humeral fracture, outside the level of imaging. 3. Atelectatic changes other acute intrathoracic or traumatic findings in the chest. 4. Cardiomegaly with four-chamber enlargement. Extensive coronary artery calcifications. 5. Moderate volume upper abdominal ascites with a nodular, heterogeneous liver surface contour as well as caudate and left lobe hypertrophy. Findings are suggestive of cirrhosis  and portal hypertension with upper abdominal ascites, findings better detailed on dedicated abdominopelvic CT. 6. Aortic Atherosclerosis (ICD10-I70.0). Electronically Signed   By: Lovena Le M.D.   On: 10/16/2019 23:29   DG Chest Portable 1 View  Result Date: 10/16/2019 CLINICAL DATA:  Shortness of breath EXAM: PORTABLE CHEST 1 VIEW COMPARISON:  None FINDINGS: Low lung volumes with streaky opacities in bases favoring atelectatic change as well as more bandlike opacities in the left mid lung which could reflect further subsegmental atelectasis or scarring. Central vascular crowding is noted. Cardiac silhouette within expected normals for portable technique. The aorta is calcified. The remaining cardiomediastinal contours are unremarkable. No acute osseous or soft tissue abnormality. Degenerative changes are present in the imaged spine and shoulders. Telemetry leads overlie the chest. IMPRESSION: Low lung volumes with basilar atelectasis. Bandlike opacities in the left mid lung may reflect further atelectasis or scarring. Electronically Signed   By: Lovena Le M.D.   On: 10/16/2019 22:47    ____________________________________________   PROCEDURES  Procedure(s) performed (including Critical Care):  Procedures   ____________________________________________   INITIAL IMPRESSION / ASSESSMENT AND PLAN / ED  COURSE   BRADDOCK SERVELLON was evaluated in Emergency Department on 10/16/2019 for the symptoms described in the history of present illness. He was evaluated in the context of the global COVID-19 pandemic, which necessitated consideration that the patient might be at risk for infection with the SARS-CoV-2 virus that causes COVID-19. Institutional protocols and algorithms that pertain to the evaluation of patients at risk for COVID-19 are in a state of rapid change based on information released by regulatory bodies including the CDC and federal and state organizations. These policies and algorithms  were followed during the patient's care in the ED.    Most Likely DDx:  -I suspect his shortness of breath is secondary to him not wearing his CPAP and waking up secondary to his sleep apnea however given his recent fall and reported confusion will get CT head to make sure onset intracranial hemorrhage and CT PE to make sure evidence of pulmonary embolism.  Patient did not seem to have any abdominal tenderness but his LFTs were resulted and were abnormal.  So I added on a CT abdomen as well.     DDx that was also considered d/t potential to cause harm, but was found less likely based on history and physical (as detailed above): -PNA (no fevers, cough but CXR to evaluate) -PNX (reassured with equal b/l breath sounds, CXR to evaluate) -Symptomatic anemia (will get H&H) -Aortic Dissection as no tearing pain and no radiation to the mid back, pulses equal -Pericarditis no rub on exam, EKG changes or hx to suggest dx -Tamponade (no notable SOB, tachycardic, hypotensive) -Esophageal rupture (no h/o diffuse vomitting/no crepitus)  Patient had off to oncoming team pending CT reads as well as troponin and reevaluation.  He understands that he would need to follow-up with his primary care doctor to get his CPAP machine   CT scan ended up resulting with cirrhosis and concern for some moderate ascites.  Discussed with patient he is not a heavy drinker, reports hepatitis as a child?.  He does report hallucinations and some difficulty with walking.  He states that seem to happen more so after the fall but I am now wondering if this could be secondary to liver issues.  We will add on ammonia and INR level.  Patient handed off pending the rest of his labs.  His admission for cirrhosis work-up versus discharge home with GI follow-up     ____________________________________________   FINAL CLINICAL IMPRESSION(S) / ED DIAGNOSES   Final diagnoses:  None     MEDICATIONS GIVEN DURING THIS  VISIT:  Medications - No data to display   ED Discharge Orders    None       Note:  This document was prepared using Dragon voice recognition software and may include unintentional dictation errors.   Vanessa [place], MD 10/16/19 [id number]    Vanessa [place], MD 10/16/19 [phone]

## 2019-10-16 NOTE — ED Notes (Addendum)
Urinal attached to bedrail. Pt reminded sample needed when possible. Lights dimmed for pt. Pt denies any needs.

## 2019-10-16 NOTE — ED Notes (Signed)
CT staff states will be by to pick pt up once blood work results.

## 2019-10-17 ENCOUNTER — Observation Stay: Payer: Medicare Other

## 2019-10-17 ENCOUNTER — Other Ambulatory Visit: Payer: Self-pay

## 2019-10-17 ENCOUNTER — Encounter: Payer: Self-pay | Admitting: Internal Medicine

## 2019-10-17 ENCOUNTER — Observation Stay
Admit: 2019-10-17 | Discharge: 2019-10-17 | Disposition: A | Discharge status: Home / Self Care | Payer: Medicare Other | Attending: Internal Medicine | Admitting: Internal Medicine

## 2019-10-17 DIAGNOSIS — R441 Visual hallucinations: Secondary | ICD-10-CM

## 2019-10-17 DIAGNOSIS — R188 Other ascites: Secondary | ICD-10-CM

## 2019-10-17 DIAGNOSIS — E785 Hyperlipidemia, unspecified: Secondary | ICD-10-CM | POA: Diagnosis present

## 2019-10-17 DIAGNOSIS — S42425K Nondisplaced comminuted supracondylar fracture without intercondylar fracture of left humerus, subsequent encounter for fracture with nonunion: Secondary | ICD-10-CM | POA: Diagnosis not present

## 2019-10-17 DIAGNOSIS — I10 Essential (primary) hypertension: Secondary | ICD-10-CM

## 2019-10-17 DIAGNOSIS — R269 Unspecified abnormalities of gait and mobility: Secondary | ICD-10-CM

## 2019-10-17 DIAGNOSIS — R791 Abnormal coagulation profile: Secondary | ICD-10-CM | POA: Diagnosis present

## 2019-10-17 DIAGNOSIS — R296 Repeated falls: Secondary | ICD-10-CM | POA: Diagnosis present

## 2019-10-17 DIAGNOSIS — K7460 Unspecified cirrhosis of liver: Secondary | ICD-10-CM | POA: Diagnosis present

## 2019-10-17 DIAGNOSIS — D696 Thrombocytopenia, unspecified: Secondary | ICD-10-CM | POA: Diagnosis present

## 2019-10-17 DIAGNOSIS — I252 Old myocardial infarction: Secondary | ICD-10-CM

## 2019-10-17 DIAGNOSIS — G4733 Obstructive sleep apnea (adult) (pediatric): Secondary | ICD-10-CM

## 2019-10-17 DIAGNOSIS — R7989 Other specified abnormal findings of blood chemistry: Secondary | ICD-10-CM | POA: Diagnosis present

## 2019-10-17 DIAGNOSIS — G629 Polyneuropathy, unspecified: Secondary | ICD-10-CM | POA: Diagnosis present

## 2019-10-17 DIAGNOSIS — Z20822 Contact with and (suspected) exposure to covid-19: Secondary | ICD-10-CM | POA: Diagnosis present

## 2019-10-17 DIAGNOSIS — R443 Hallucinations, unspecified: Secondary | ICD-10-CM

## 2019-10-17 DIAGNOSIS — F419 Anxiety disorder, unspecified: Secondary | ICD-10-CM | POA: Diagnosis present

## 2019-10-17 DIAGNOSIS — F32A Depression, unspecified: Secondary | ICD-10-CM

## 2019-10-17 DIAGNOSIS — Z791 Long term (current) use of non-steroidal anti-inflammatories (NSAID): Secondary | ICD-10-CM | POA: Diagnosis not present

## 2019-10-17 DIAGNOSIS — G238 Other specified degenerative diseases of basal ganglia: Secondary | ICD-10-CM | POA: Diagnosis present

## 2019-10-17 DIAGNOSIS — R748 Abnormal levels of other serum enzymes: Secondary | ICD-10-CM | POA: Diagnosis present

## 2019-10-17 DIAGNOSIS — S42422A Displaced comminuted supracondylar fracture without intercondylar fracture of left humerus, initial encounter for closed fracture: Secondary | ICD-10-CM | POA: Diagnosis present

## 2019-10-17 DIAGNOSIS — Z9181 History of falling: Secondary | ICD-10-CM

## 2019-10-17 DIAGNOSIS — Z955 Presence of coronary angioplasty implant and graft: Secondary | ICD-10-CM | POA: Diagnosis not present

## 2019-10-17 DIAGNOSIS — W19XXXA Unspecified fall, initial encounter: Secondary | ICD-10-CM | POA: Diagnosis present

## 2019-10-17 DIAGNOSIS — R410 Disorientation, unspecified: Secondary | ICD-10-CM | POA: Diagnosis present

## 2019-10-17 DIAGNOSIS — I2510 Atherosclerotic heart disease of native coronary artery without angina pectoris: Secondary | ICD-10-CM | POA: Diagnosis present

## 2019-10-17 DIAGNOSIS — F329 Major depressive disorder, single episode, unspecified: Secondary | ICD-10-CM | POA: Diagnosis present

## 2019-10-17 DIAGNOSIS — R0600 Dyspnea, unspecified: Secondary | ICD-10-CM | POA: Diagnosis not present

## 2019-10-17 DIAGNOSIS — M6289 Other specified disorders of muscle: Secondary | ICD-10-CM | POA: Diagnosis present

## 2019-10-17 DIAGNOSIS — F17200 Nicotine dependence, unspecified, uncomplicated: Secondary | ICD-10-CM | POA: Diagnosis present

## 2019-10-17 LAB — SARS CORONAVIRUS 2 BY RT PCR (HOSPITAL ORDER, PERFORMED IN ~~LOC~~ HOSPITAL LAB): SARS Coronavirus 2: NEGATIVE

## 2019-10-17 LAB — CBC
HCT: 36.2 % — ABNORMAL LOW (ref 39.0–52.0)
Hemoglobin: 13 g/dL (ref 13.0–17.0)
MCH: 35.1 pg — ABNORMAL HIGH (ref 26.0–34.0)
MCHC: 35.9 g/dL (ref 30.0–36.0)
MCV: 97.8 fL (ref 80.0–100.0)
Platelets: 166 10*3/uL (ref 150–400)
RBC: 3.7 MIL/uL — ABNORMAL LOW (ref 4.22–5.81)
RDW: 13.7 % (ref 11.5–15.5)
WBC: 8.8 10*3/uL (ref 4.0–10.5)
nRBC: 0 % (ref 0.0–0.2)

## 2019-10-17 LAB — PROTIME-INR
INR: 1.5 — ABNORMAL HIGH (ref 0.8–1.2)
Prothrombin Time: 17.8 seconds — ABNORMAL HIGH (ref 11.4–15.2)

## 2019-10-17 LAB — HIV ANTIBODY (ROUTINE TESTING W REFLEX): HIV Screen 4th Generation wRfx: NONREACTIVE

## 2019-10-17 LAB — CREATININE, SERUM
Creatinine, Ser: 0.8 mg/dL (ref 0.61–1.24)
GFR calc Af Amer: >60 mL/min (ref >60)
GFR calc non Af Amer: >60 mL/min (ref >60)

## 2019-10-17 LAB — VITAMIN B12: Vitamin B-12: 973 pg/mL — ABNORMAL HIGH (ref 180–914)

## 2019-10-17 LAB — ECHOCARDIOGRAM COMPLETE
Height: 73 in
Weight: 3920 oz

## 2019-10-17 LAB — TROPONIN I (HIGH SENSITIVITY): Troponin I (High Sensitivity): 7 ng/L (ref <18)

## 2019-10-17 LAB — SEDIMENTATION RATE: Sed Rate: 15 mm/hr (ref 0–20)

## 2019-10-17 LAB — AMMONIA: Ammonia: 30 umol/L (ref 9–35)

## 2019-10-17 LAB — TSH: TSH: 2.133 u[IU]/mL (ref 0.350–4.500)

## 2019-10-17 MED ORDER — TRAZODONE HCL 50 MG PO TABS
300.0000 mg | ORAL_TABLET | Freq: Every day | ORAL | Status: DC
  Administered 2019-10-17 – 2019-10-22 (×6): 300 mg via ORAL
  Filled 2019-10-17 (×6): qty 6

## 2019-10-17 MED ORDER — OXYCODONE-ACETAMINOPHEN 5-325 MG PO TABS
1.0000 | ORAL_TABLET | ORAL | Status: DC | PRN
  Administered 2019-10-17 – 2019-10-19 (×8): 1 via ORAL
  Filled 2019-10-17 (×8): qty 1

## 2019-10-17 MED ORDER — MONTELUKAST SODIUM 10 MG PO TABS
10.0000 mg | ORAL_TABLET | Freq: Every day | ORAL | Status: DC
  Administered 2019-10-17 – 2019-10-23 (×7): 10 mg via ORAL
  Filled 2019-10-17 (×7): qty 1

## 2019-10-17 MED ORDER — EZETIMIBE-SIMVASTATIN 10-80 MG PO TABS: 1.0000 | ORAL_TABLET | Freq: Every day | ORAL | Status: DC

## 2019-10-17 MED ORDER — PANTOPRAZOLE SODIUM 40 MG PO TBEC
40.0000 mg | DELAYED_RELEASE_TABLET | Freq: Two times a day (BID) | ORAL | Status: DC
  Administered 2019-10-17 – 2019-10-23 (×12): 40 mg via ORAL
  Filled 2019-10-17 (×12): qty 1

## 2019-10-17 MED ORDER — ONDANSETRON HCL 4 MG/2ML IJ SOLN
4.0000 mg | Freq: Four times a day (QID) | INTRAMUSCULAR | Status: DC | PRN
  Administered 2019-10-17 – 2019-10-22 (×2): 4 mg via INTRAVENOUS
  Filled 2019-10-17 (×2): qty 2

## 2019-10-17 MED ORDER — EZETIMIBE 10 MG PO TABS
10.0000 mg | ORAL_TABLET | Freq: Every day | ORAL | Status: DC
  Administered 2019-10-17 – 2019-10-22 (×6): 10 mg via ORAL
  Filled 2019-10-17 (×7): qty 1

## 2019-10-17 MED ORDER — ENOXAPARIN SODIUM 40 MG/0.4ML ~~LOC~~ SOLN
40.0000 mg | SUBCUTANEOUS | Status: DC
  Administered 2019-10-17 – 2019-10-23 (×7): 40 mg via SUBCUTANEOUS
  Filled 2019-10-17 (×7): qty 0.4

## 2019-10-17 MED ORDER — BUPROPION HCL ER (XL) 150 MG PO TB24
150.0000 mg | ORAL_TABLET | Freq: Every day | ORAL | Status: DC
  Administered 2019-10-17 – 2019-10-23 (×7): 150 mg via ORAL
  Filled 2019-10-17 (×7): qty 1

## 2019-10-17 MED ORDER — SODIUM CHLORIDE 0.9% FLUSH
3.0000 mL | Freq: Two times a day (BID) | INTRAVENOUS | Status: DC
  Administered 2019-10-17 – 2019-10-22 (×10): 3 mL via INTRAVENOUS

## 2019-10-17 MED ORDER — VENLAFAXINE HCL ER 75 MG PO CP24
225.0000 mg | ORAL_CAPSULE | Freq: Every day | ORAL | Status: DC
  Administered 2019-10-17 – 2019-10-23 (×7): 225 mg via ORAL
  Filled 2019-10-17 (×7): qty 3

## 2019-10-17 MED ORDER — ONDANSETRON HCL 4 MG PO TABS: 4.0000 mg | ORAL_TABLET | Freq: Four times a day (QID) | ORAL | Status: DC | PRN

## 2019-10-17 MED ORDER — ASPIRIN EC 81 MG PO TBEC
81.0000 mg | DELAYED_RELEASE_TABLET | Freq: Every day | ORAL | Status: DC
  Administered 2019-10-18 – 2019-10-23 (×6): 81 mg via ORAL
  Filled 2019-10-17 (×6): qty 1

## 2019-10-17 MED ORDER — MORPHINE SULFATE (PF) 2 MG/ML IV SOLN
2.0000 mg | INTRAVENOUS | Status: DC | PRN
  Administered 2019-10-17 (×2): 2 mg via INTRAVENOUS
  Filled 2019-10-17 (×2): qty 1

## 2019-10-17 MED ORDER — SIMVASTATIN 20 MG PO TABS
80.0000 mg | ORAL_TABLET | Freq: Every day | ORAL | Status: DC
  Administered 2019-10-17 – 2019-10-22 (×6): 80 mg via ORAL
  Filled 2019-10-17 (×6): qty 4

## 2019-10-17 MED ORDER — NITROGLYCERIN 0.4 MG SL SUBL: 0.4000 mg | SUBLINGUAL_TABLET | SUBLINGUAL | Status: DC | PRN

## 2019-10-17 MED ORDER — FLUOXETINE HCL 10 MG PO CAPS
10.0000 mg | ORAL_CAPSULE | Freq: Every day | ORAL | Status: DC
  Administered 2019-10-17 – 2019-10-23 (×7): 10 mg via ORAL
  Filled 2019-10-17 (×7): qty 1

## 2019-10-17 MED ORDER — FLUTICASONE PROPIONATE 50 MCG/ACT NA SUSP
2.0000 | Freq: Every day | NASAL | Status: DC | PRN
  Filled 2019-10-17: qty 16

## 2019-10-17 NOTE — H&P (Signed)
History and Physical    Alex Hampton KYH:[id number] DOB: 1955-08-13 DOA: 10/16/2019  PCP: Juluis Pitch, MD   Patient coming from: Home  I have personally briefly reviewed patient's old medical records in [place]  Chief Complaint:  shortness of breath, gait imbalance, hallucinations  HPI: Alex Hampton is a 64 y.o. male with medical history significant for history of MI, HTN, OSA not currently on CPAP, depression and anxiety who presents to the emergency room with a main complaint of shortness of breath that awoke him from sleep associated with diaphoresis.  He denies cough fever or chills and denies lower extremity pain or swelling.  Patient was seen in the emergency room on 10/11/2019 for a fall in which she sustained a fracture of the left humerus.  He states that since that fall he has been having problems with his gait and also feels like he is seeing things that aren't there since his fall a few days ago.    Chart review, reveals that patient saw his PCP on 10/10/19 after not having been seen in a year with a complaint of unsteady gait for the past several months leading to falls, as well as abdominal pain.  He had blood work that showed elevated liver enzymes, bilirubin and mild thrombocytopenia.  At the present time, patient does not have abdominal pain and has no nausea vomiting or change in bowel habits. ED Course: On arrival patient was asymptomatic.  Vitals were within normal limits.  EKG showed sinus rhythm with prolonged QTC but no acute ST-T wave changes.  Troponin was negative at 8 and 7.  Blood work remarkable for abnormal liver enzymes with AST 81, alk phos 177 and total bilirubin 2.9.  INR was elevated at 1.5.  Platelet count was 198,000.  Ammonia level normal at 30. CT head with no acute findings, CTA negative for acute PE, CT abdomen showed cirrhosis with mild splenomegaly and moderate volume abdominal pelvic ascites.  Hospitalist consulted for admission.  Review of  Systems: As per HPI otherwise all other systems on review of systems negative.    Past Medical History:  Diagnosis Date  . Depression   . History of cardiac cath   . History of heart artery stent   . Hyperlipidemia   . Hypertension   . MI (myocardial infarction) ([place])     History reviewed. No pertinent surgical history.   reports that he has been smoking. He has never used smokeless tobacco. He reports current alcohol use. He reports that he does not use drugs.  No Known Allergies  Family history reviewed.  No family history of CAD.  No other pertinent family history   Prior to Admission medications   Medication Sig Start Date End Date Taking? Authorizing Provider  aspirin 81 MG EC tablet Take by mouth.    [provider]  buPROPion (WELLBUTRIN XL) 150 MG 24 hr tablet Take by mouth. 09/18/18   [provider]  ezetimibe-simvastatin (VYTORIN) 10-80 MG tablet TAKE 1 TABLET BY MOUTH EVERY DAY AT NIGHT 01/20/19   [provider]  FLUoxetine (PROZAC) 10 MG capsule Take by mouth. 09/18/18   [provider]  fluticasone (FLONASE) 50 MCG/ACT nasal spray SPRAY 2 SPRAYS INTO EACH NOSTRIL EVERY DAY 09/30/18   [provider]  hydrocortisone 2.5 % lotion hydrocortisone 2.5 % lotion  APPLY TO AFFECTED AREA UP TO 3 TIMES A WEEK MONDAY,WEDNESDAY, AND FRIDAY    [provider]  lisinopril (ZESTRIL) 10 MG tablet  Take by mouth. 09/18/18   [provider]  meloxicam (MOBIC) 15 MG tablet TAKE 1 TABLET BY MOUTH DAILY WITH MEALS 10/15/15   [provider]  metoprolol tartrate (LOPRESSOR) 100 MG tablet Take by mouth. 09/18/18   [provider]  montelukast (SINGULAIR) 10 MG tablet Take 1 tablet by mouth daily. 01/20/19   [provider]  oxyCODONE-acetaminophen (PERCOCET) 5-325 MG tablet Take 1 tablet by mouth every 4 (four) hours as needed for severe pain. 10/11/19 10/10/20  Sable Feil, PA-C  traZODone (DESYREL) 150 MG  tablet Take 75 mg by mouth at bedtime. 09/15/19   [provider]    Physical Exam: Vitals:   10/16/19 2209 10/16/19 2226 10/16/19 2230 10/16/19 2256  BP:   111/62   Pulse: (!) 57 60  (!) 58  Resp: 12 16    Temp:      TempSrc:      SpO2: 95% 99%  98%  Weight:      Height:         Vitals:   10/16/19 2209 10/16/19 2226 10/16/19 2230 10/16/19 2256  BP:   111/62   Pulse: (!) 57 60  (!) 58  Resp: 12 16    Temp:      TempSrc:      SpO2: 95% 99%  98%  Weight:      Height:          Constitutional: Alert and oriented x 3 . Not in any apparent distress HEENT:      Head: Normocephalic and atraumatic.         Eyes: PERLA, EOMI, Conjunctivae are normal. Sclera is non-icteric.       Mouth/Throat: Mucous membranes are moist.       Neck: Supple with no signs of meningismus. Cardiovascular: Regular rate and rhythm. No murmurs, gallops, or rubs. 2+ symmetrical distal pulses are present . No JVD. No LE edema Respiratory: Respiratory effort normal .Lungs sounds clear bilaterally. No wheezes, crackles, or rhonchi.  Gastrointestinal: Soft, non tender, and non distended with positive bowel sounds. No rebound or guarding. Genitourinary: No CVA tenderness. Musculoskeletal: Nontender with normal range of motion in all extremities. No cyanosis, or erythema of extremities. Neurologic: Normal speech and language. Face is symmetric. Moving all extremities. No gross focal neurologic deficits . Skin: Skin is warm, dry.  No rash or ulcers Psychiatric: Mood and affect are normal Speech and behavior are normal   Labs on Admission: I have personally reviewed following labs and imaging studies  CBC: Recent Labs  Lab 10/16/19 2205  WBC 9.5  NEUTROABS 6.7  HGB 14.6  HCT 40.7  MCV 96.7  PLT 599   Basic Metabolic Panel: Recent Labs  Lab 10/16/19 2205  NA 133*  K 4.7  CL 100  CO2 24  GLUCOSE 118*  BUN 13  CREATININE 0.85  CALCIUM 8.4*   GFR: Estimated Creatinine Clearance:  113.2 mL/min (by C-G formula based on SCr of 0.85 mg/dL). Liver Function Tests: Recent Labs  Lab 10/16/19 2205  AST 81*  ALT 37  ALKPHOS 177*  BILITOT 2.9*  PROT 6.2*  ALBUMIN 2.7*   No results for input(s): LIPASE, AMYLASE in the last 168 hours. Recent Labs  Lab 10/17/19 0217  AMMONIA 30   Coagulation Profile: Recent Labs  Lab 10/17/19 0121  INR 1.5*   Cardiac Enzymes: No results for input(s): CKTOTAL, CKMB, CKMBINDEX, TROPONINI in the last 168 hours. BNP (last 3 results) No results for input(s): PROBNP in  the last 8760 hours. HbA1C: No results for input(s): HGBA1C in the last 72 hours. CBG: No results for input(s): GLUCAP in the last 168 hours. Lipid Profile: No results for input(s): CHOL, HDL, LDLCALC, TRIG, CHOLHDL, LDLDIRECT in the last 72 hours. Thyroid Function Tests: No results for input(s): TSH, T4TOTAL, FREET4, T3FREE, THYROIDAB in the last 72 hours. Anemia Panel: No results for input(s): VITAMINB12, FOLATE, FERRITIN, TIBC, IRON, RETICCTPCT in the last 72 hours. Urine analysis: No results found for: COLORURINE, APPEARANCEUR, [place], [place], GLUCOSEU, HGBUR, BILIRUBINUR, KETONESUR, PROTEINUR, UROBILINOGEN, NITRITE, LEUKOCYTESUR  Radiological Exams on Admission: CT ABDOMEN PELVIS WO CONTRAST  Result Date: 10/16/2019 CLINICAL DATA:  Abdominal distension. Patient had recent fall. EXAM: CT ABDOMEN AND PELVIS WITHOUT CONTRAST TECHNIQUE: Multidetector CT imaging of the abdomen and pelvis was performed following the standard protocol without IV contrast. COMPARISON:  Included portion from chest CTA earlier today. FINDINGS: Lower chest: Assessed on concurrent chest CT. Hepatobiliary: Nodular hepatic contours consistent with cirrhosis. No focal abnormality on noncontrast exam. Distended gallbladder without gallstones. No biliary dilatation. Perihepatic ascites measures simple fluid density. Pancreas: No ductal dilatation. No discrete peripancreatic inflammation. Spleen:  Mildly enlarged spanning 14.4 cm. Small cleft laterally. Perisplenic fluid measures simple fluid density. Adrenals/Urinary Tract: Low-density thickening of the right adrenal gland without dominant nodule. Left adrenal gland is normal. There is excreted IV contrast within both renal collecting systems from preceding chest CTA. No hydronephrosis. Excreted IV contrast within the urinary bladder. Bladder is minimally distended. Stomach/Bowel: Stomach is unremarkable. There is no small bowel obstruction or obvious inflammation. Moderate colonic stool burden. There is no colonic wall thickening or inflammatory change. Appendix tentatively visualized in the right pericolic gutter. Vascular/Lymphatic: Aortic atherosclerosis. No aortic aneurysm. There is no retroperitoneal fluid. Multiple small retroperitoneal lymph nodes are likely reactive. Reproductive: Prominent prostate gland spans 5.3 cm transverse. Other: Moderate volume abdominopelvic ascites. Fluid measures simple fluid density. Mild mesenteric edema and small amount of mesenteric ascites. There is no free air. Small fat containing inguinal hernias. Musculoskeletal: No acute fracture of the lumbar spine, pelvis, or included ribs. Bilateral hip osteoarthritis. IMPRESSION: 1. Cirrhosis with mild splenomegaly and moderate volume abdominopelvic ascites. 2. Aortic Atherosclerosis (ICD10-I70.0). Electronically Signed   By: Keith Rake M.D.   On: 10/16/2019 23:32   CT Head Wo Contrast  Result Date: 10/16/2019 CLINICAL DATA:  Headache, acute, normal neuro exam EXAM: CT HEAD WITHOUT CONTRAST TECHNIQUE: Contiguous axial images were obtained from the base of the skull through the vertex without intravenous contrast. COMPARISON:  Head CT 5 days ago 10/11/2019 FINDINGS: Brain: No intracranial hemorrhage, mass effect, or midline shift. No hydrocephalus. The basilar cisterns are patent. No evidence of territorial infarct or acute ischemia. No extra-axial or intracranial  fluid collection. Vascular: No hyperdense vessel or unexpected calcification. Skull: No fracture or focal lesion. Sinuses/Orbits: Paranasal sinuses and mastoid air cells are clear. The visualized orbits are unremarkable. Other: None. IMPRESSION: Negative noncontrast head CT. Electronically Signed   By: Keith Rake M.D.   On: 10/16/2019 23:21   CT Angio Chest PE W and/or Wo Contrast  Result Date: 10/16/2019 CLINICAL DATA:  Shortness of breath EXAM: CT ANGIOGRAPHY CHEST WITH CONTRAST TECHNIQUE: Multidetector CT imaging of the chest was performed using the standard protocol during bolus administration of intravenous contrast. Multiplanar CT image reconstructions and MIPs were obtained to evaluate the vascular anatomy. CONTRAST:  73m OMNIPAQUE IOHEXOL 350 MG/ML SOLN COMPARISON:  Radiograph 10/16/2019 FINDINGS: Cardiovascular: Satisfactory opacification the pulmonary arteries to the segmental level. No pulmonary  artery filling defects are identified. Central pulmonary arteries are normal caliber. Cardiomegaly with four-chamber enlargement. No pericardial effusion. Extensive coronary artery calcifications are present. Minimal plaque in the normal caliber thoracic aorta. No acute luminal abnormality. No periaortic stranding or hemorrhage. Normal 3 vessel branching. Proximal great vessels are unremarkable aside from minimal plaque at the vertebral artery origins. Mediastinum/Nodes: No mediastinal fluid or gas. Normal thyroid gland and thoracic inlet. No acute abnormality of the trachea. Fluid-filled distal thoracic esophagus. No worrisome mediastinal or hilar adenopathy. There are extensive, asymmetric edematous changes centered upon the left shoulder and axillary soft tissues. No associated axillary adenopathy is evident. Lungs/Pleura: Low lung volumes with dependent atelectatic changes posteriorly. More bandlike areas of opacity are present in the lung bases, likely reflecting further atelectasis or scarring  subpleural fat noted in both lung bases, right slightly greater than left. No pneumothorax or visible effusion. No suspicious pulmonary nodules or masses. Upper Abdomen: Moderate volume upper abdominal ascites with a nodular, heterogeneous liver surface contour as well as caudate and left lobe hypertrophy. Upper abdomen is otherwise unremarkable. Musculoskeletal: Soft tissue stranding and edematous changes of the left shoulder and axillary region, as above, likely secondary to recent humeral fracture, outside the level of imaging. No acute osseous or musculotendinous injury is identified within the field of view. Review of the MIP images confirms the above findings. IMPRESSION: 1. No acute pulmonary artery filling defects to suggest pulmonary embolism. 2. Extensive, asymmetric edematous changes centered upon the left shoulder and axillary soft tissues, likely secondary to recent humeral fracture, outside the level of imaging. 3. Atelectatic changes other acute intrathoracic or traumatic findings in the chest. 4. Cardiomegaly with four-chamber enlargement. Extensive coronary artery calcifications. 5. Moderate volume upper abdominal ascites with a nodular, heterogeneous liver surface contour as well as caudate and left lobe hypertrophy. Findings are suggestive of cirrhosis and portal hypertension with upper abdominal ascites, findings better detailed on dedicated abdominopelvic CT. 6. Aortic Atherosclerosis (ICD10-I70.0). Electronically Signed   By: Lovena Le M.D.   On: 10/16/2019 23:29   DG Chest Portable 1 View  Result Date: 10/16/2019 CLINICAL DATA:  Shortness of breath EXAM: PORTABLE CHEST 1 VIEW COMPARISON:  None FINDINGS: Low lung volumes with streaky opacities in bases favoring atelectatic change as well as more bandlike opacities in the left mid lung which could reflect further subsegmental atelectasis or scarring. Central vascular crowding is noted. Cardiac silhouette within expected normals for  portable technique. The aorta is calcified. The remaining cardiomediastinal contours are unremarkable. No acute osseous or soft tissue abnormality. Degenerative changes are present in the imaged spine and shoulders. Telemetry leads overlie the chest. IMPRESSION: Low lung volumes with basilar atelectasis. Bandlike opacities in the left mid lung may reflect further atelectasis or scarring. Electronically Signed   By: Lovena Le M.D.   On: 10/16/2019 22:47    EKG: Independently reviewed. Interpretation : Sinus rhythm with prolonged QTC.  No acute ST-T wave changes  Assessment/Plan 64 year old male, with history of MI with stent, HTN, depression, OSA presenting with a several month history of gait imbalance and falls most recently 10/11/2019, with an episode of  shortness of breath on the day of admission.  Work-up concerning for new diagnosis of cirrhosis.    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  Recent thrombocytopenia of 113 at doctor's office on 10/10/19 but platelet count back to normal - CT abdomen showing evidence of cirrhosis with ascites -Consider GI evaluation  Dyspnea, episode of -Patient is single episode of acute dyspnea that woke him from sleep, now resolved -Etiology uncertain, possibly related to pressure from upper abdominal ascites seen on CT.  CT also showed cardiomegaly with some compressive atelectasis --CTA negative for acute PE. Unlikely cardiac -Follow echocardiogram    Visual Hallucinations -Patient with reports of seeing things since recent fall -Ammonia level within normal  limits and no history of alcohol or illicit drug use, CT head negative -Etiology uncertain -Continue neurochecks  History of MI with stent angioplasty in 2004 -Denies chest pain. Troponin 8 and then 7.  No acute ischemic changes on EKG -Continue home aspirin and metoprolol and Vytorin    HTN (hypertension) -Continue home metoprolol and lisinopril pending med rec    OSA (obstructive sleep apnea) -Noncompliant with CPAP.  Lost his machine    Nondisplaced comminuted  fracture of left humerus on 10/11/2019, subsequent encounter -Continue sling, pain management    Anxiety and depression -Continue home meds   DVT prophylaxis: Lovenox  Code Status: full code  Family Communication:  none  Disposition Plan: Back to previous home environment Consults called: none  Status: Observation    Athena Masse MD [facility]     10/17/2019, 3:54 AM

## 2019-10-17 NOTE — Progress Notes (Signed)
*  PRELIMINARY RESULTS* Echocardiogram 2D Echocardiogram has been performed.  Alex Hampton 10/17/2019, 12:18 PM

## 2019-10-17 NOTE — Progress Notes (Signed)
  No new complaints.  His main concern is unsteady gait and falls at home.  He was not aware of diagnosis of liver cirrhosis and ascites.  This has been discussed with him in detail.  CT head and MRI brain did not show any acute abnormality.  Vitamin B1, vitamin B12 and RPR have been ordered for further evaluation.  Patient will be evaluated by neurologist for further recommendations.  PT and OT evaluation.     LOS: 0 days   Feliciano Wynter  [facility]     10/17/2019, 9:14 AM

## 2019-10-17 NOTE — Consult Note (Signed)
Reason for Consult:Difficulty with gait Requesting Physician: Mal Misty  CC: SOB  I have been asked by Dr. Mal Misty to see this patient in consultation for difficulty with gait.  HPI: Alex Hampton is an 64 y.o. male with a history of HTN, HLD and MI who presented with complaints of shortness of breath.  In interview patient also reports multiple other complaints.  Reports that for the past 3 years has had difficulty with gait and frequent falls.  Fall have become increasingly more frequent.  Patient also reports dizziness, and hallucinations over the past 6 months.  Has noticed tremor in his legs bilaterally.  Does not feel there has been any cognitive decline.  Has difficulty having a bowel movement for the past 3 weeks.  No bowel or bladder incontinence.  Urine has been brown for some time.  Consult called for further recommendations.    Past Medical History:  Diagnosis Date  . Depression   . History of cardiac cath   . History of heart artery stent   . Hyperlipidemia   . Hypertension   . MI (myocardial infarction) ([place])     History reviewed. No pertinent surgical history.  History reviewed. No pertinent family history.  Social History:  reports that he has been smoking. He has never used smokeless tobacco. He reports current alcohol use. He reports that he does not use drugs.  No Known Allergies  Medications:  I have reviewed the patient's current medications. Prior to Admission:  Medications Prior to Admission  Medication Sig Dispense Refill Last Dose  . amoxicillin (AMOXIL) 500 MG tablet Take 1,000 mg by mouth 2 (two) times daily.   10/15/2019 at 2300  . aspirin 81 MG EC tablet Take by mouth.   10/15/2019 at 1130  . buPROPion (WELLBUTRIN XL) 150 MG 24 hr tablet Take by mouth.   10/15/2019 at 1130  . clarithromycin (BIAXIN) 500 MG tablet Take 500 mg by mouth 2 (two) times daily.   10/15/2019 at 2300  . ezetimibe-simvastatin (VYTORIN) 10-80 MG tablet TAKE 1 TABLET BY MOUTH EVERY DAY AT NIGHT    10/15/2019 at 2300  . FLUoxetine (PROZAC) 10 MG capsule Take by mouth.   10/15/2019 at 2300  . fluticasone (FLONASE) 50 MCG/ACT nasal spray SPRAY 2 SPRAYS INTO EACH NOSTRIL EVERY DAY   prn at prn  . hydrocortisone 2.5 % lotion hydrocortisone 2.5 % lotion  APPLY TO AFFECTED AREA UP TO 3 TIMES A WEEK MONDAY,WEDNESDAY, AND FRIDAY   prn at prn  . lisinopril (ZESTRIL) 10 MG tablet Take 10 mg by mouth daily.    10/15/2019 at 1100  . meloxicam (MOBIC) 15 MG tablet TAKE 1 TABLET BY MOUTH DAILY WITH MEALS   10/15/2019 at 1100  . metoprolol tartrate (LOPRESSOR) 100 MG tablet Take 100 mg by mouth daily as needed.    10/15/2019 at 1100  . montelukast (SINGULAIR) 10 MG tablet Take 1 tablet by mouth daily.   10/15/2019 at 1100  . oxyCODONE-acetaminophen (PERCOCET) 5-325 MG tablet Take 1 tablet by mouth every 4 (four) hours as needed for severe pain. 20 tablet 0 prn at prn  . pantoprazole (PROTONIX) 40 MG tablet Take 40 mg by mouth 2 (two) times daily.   10/15/2019 at 2300  . traZODone (DESYREL) 150 MG tablet Take 75 mg by mouth at bedtime.   10/15/2019 at 2300  . venlafaxine XR (EFFEXOR-XR) 75 MG 24 hr capsule Take 225 mg by mouth daily.   10/15/2019 at 1100   Scheduled: . enoxaparin (  LOVENOX) injection  40 mg Subcutaneous Q24H  . sodium chloride flush  3 mL Intravenous Q12H    ROS: History obtained from the patient  General ROS: negative for - chills, fatigue, fever, night sweats, weight gain or weight loss Psychological ROS: negative for - behavioral disorder, hallucinations, memory difficulties, mood swings or suicidal ideation Ophthalmic ROS: negative for - blurry vision, double vision, eye pain or loss of vision ENT ROS: negative for - epistaxis, nasal discharge, oral lesions, sore throat, tinnitus Allergy and Immunology ROS: negative for - hives or itchy/watery eyes Hematological and Lymphatic ROS: negative for - bleeding problems, bruising or swollen lymph nodes Endocrine ROS: negative for - galactorrhea, hair  pattern changes, polydipsia/polyuria or temperature intolerance Respiratory ROS: negative for - cough, hemoptysis, shortness of breath or wheezing Cardiovascular ROS: negative for - chest pain, dyspnea on exertion, edema or irregular heartbeat Gastrointestinal ROS: negative for - abdominal pain, diarrhea, hematemesis, nausea/vomiting or stool incontinence Genito-Urinary ROS: negative for - dysuria, hematuria, incontinence or urinary frequency/urgency Musculoskeletal ROS: shoulder pain, back pain Neurological ROS: as noted in HPI Dermatological ROS: negative for rash and skin lesion changes  Physical Examination: Blood pressure 127/64, pulse 62, temperature 98.1 F (36.7 C), resp. rate 19, height _0  (1.854 m), weight 111.1 kg, SpO2 100 %.  HEENT-  Normocephalic, no lesions, without obvious abnormality.  Normal external eye and conjunctiva.  Normal TM's bilaterally.  Normal auditory canals and external ears. Normal external nose, mucus membranes and septum.  Normal pharynx. Cardiovascular- S1, S2 normal, pulses palpable throughout   Lungs- chest clear, no wheezing, rales, normal symmetric air entry Abdomen- soft, non-tender; bowel sounds normal; no masses,  no organomegaly Extremities- left arm in sling Lymph-no adenopathy palpable Musculoskeletal-no joint tenderness, deformity or swelling Skin-warm and dry, no hyperpigmentation, vitiligo, or suspicious lesions  Neurological Examination  Mental Status: Alert, oriented, thought content appropriate.  Speech fluent without evidence of aphasia.  Able to follow 3 step commands without difficulty. Cranial Nerves: II: Discs flat bilaterally; Visual fields grossly normal, pupils equal, round, reactive to light and accommodation III,IV, VI: ptosis not present, extra-ocular motions intact bilaterally V,VII: smile symmetric, facial light touch sensation normal bilaterally VIII: hearing normal bilaterally IX,X: gag reflex present XI: bilateral  shoulder shrug XII: midline tongue extension Motor: 5/5 strength in the RUE.  LUE in a sling.  4+-5-/5 strength in the hip flexors bilaterally with 5/5 otherwise in the BLE's.  Increased tone throughout.   Sensory: Pinprick and light touch decreased in the right foot Deep Tendon Reflexes: Symmetric with absent AJ's bilaterally Plantars: Right: mute   Left: mute Cerebellar: Mild dysmetria with heel to shin testing using the RLE Gait: not tested due to safety concerns    Laboratory Studies:   Basic Metabolic Panel: Recent Labs  Lab 10/16/19 2205 10/17/19 0858  NA 133*  --   K 4.7  --   CL 100  --   CO2 24  --   GLUCOSE 118*  --   BUN 13  --   CREATININE 0.85 0.80  CALCIUM 8.4*  --     Liver Function Tests: Recent Labs  Lab 10/16/19 2205  AST 81*  ALT 37  ALKPHOS 177*  BILITOT 2.9*  PROT 6.2*  ALBUMIN 2.7*   No results for input(s): LIPASE, AMYLASE in the last 168 hours. Recent Labs  Lab 10/17/19 0217  AMMONIA 30    CBC: Recent Labs  Lab 10/16/19 2205 10/17/19 0858  WBC 9.5 8.8  NEUTROABS 6.7  --  HGB 14.6 13.0  HCT 40.7 36.2*  MCV 96.7 97.8  PLT 198 166    Cardiac Enzymes: No results for input(s): CKTOTAL, CKMB, CKMBINDEX, TROPONINI in the last 168 hours.  BNP: Invalid input(s): POCBNP  CBG: No results for input(s): GLUCAP in the last 168 hours.  Microbiology: Results for orders placed or performed during the hospital encounter of 10/16/19  SARS Coronavirus 2 by RT PCR (hospital order, performed in [facility] hospital lab) Nasopharyngeal Nasopharyngeal Swab     Status: None   Collection Time: 10/17/19  9:22 AM   Specimen: Nasopharyngeal Swab  Result Value Ref Range Status   SARS Coronavirus 2 NEGATIVE NEGATIVE Final    Comment: (NOTE) SARS-CoV-2 target nucleic acids are NOT DETECTED.  The SARS-CoV-2 RNA is generally detectable in upper and lower respiratory specimens during the acute phase of infection. The lowest concentration of  SARS-CoV-2 viral copies this assay can detect is 250 copies / mL. A negative result does not preclude SARS-CoV-2 infection and should not be used as the sole basis for treatment or other patient management decisions.  A negative result may occur with improper specimen collection / handling, submission of specimen other than nasopharyngeal swab, presence of viral mutation(s) within the areas targeted by this assay, and inadequate number of viral copies (<250 copies / mL). A negative result must be combined with clinical observations, patient history, and epidemiological information.  Fact Sheet for Patients:   StrictlyIdeas.no  Fact Sheet for Healthcare Providers: BankingDealers.co.za  This test is not yet approved or  cleared by the [place] FDA and has been authorized for detection and/or diagnosis of SARS-CoV-2 by FDA under an Emergency Use Authorization (EUA).  This EUA will remain in effect (meaning this test can be used) for the duration of the COVID-19 declaration under Section 564(b)(1) of the Act, 21 U.S.C. section 360bbb-3(b)(1), unless the authorization is terminated or revoked sooner.  Performed at [facility], [address] [place], [place] [address]     Coagulation Studies: Recent Labs    10/17/19 0121  LABPROT 17.8*  INR 1.5*    Urinalysis: No results for input(s): COLORURINE, LABSPEC, PHURINE, GLUCOSEU, HGBUR, BILIRUBINUR, KETONESUR, PROTEINUR, UROBILINOGEN, NITRITE, LEUKOCYTESUR in the last 168 hours.  Invalid input(s): APPERANCEUR  Lipid Panel:  No results found for: CHOL, TRIG, HDL, CHOLHDL, VLDL, LDLCALC  HgbA1C: No results found for: HGBA1C  Urine Drug Screen:  No results found for: LABOPIA, COCAINSCRNUR, LABBENZ, AMPHETMU, THCU, LABBARB  Alcohol Level: No results for input(s): ETH in the last 168 hours.  Other results: EKG: sinus rhythm at 60 bpm.  Imaging: CT ABDOMEN PELVIS WO  CONTRAST  Result Date: 10/16/2019 CLINICAL DATA:  Abdominal distension. Patient had recent fall. EXAM: CT ABDOMEN AND PELVIS WITHOUT CONTRAST TECHNIQUE: Multidetector CT imaging of the abdomen and pelvis was performed following the standard protocol without IV contrast. COMPARISON:  Included portion from chest CTA earlier today. FINDINGS: Lower chest: Assessed on concurrent chest CT. Hepatobiliary: Nodular hepatic contours consistent with cirrhosis. No focal abnormality on noncontrast exam. Distended gallbladder without gallstones. No biliary dilatation. Perihepatic ascites measures simple fluid density. Pancreas: No ductal dilatation. No discrete peripancreatic inflammation. Spleen: Mildly enlarged spanning 14.4 cm. Small cleft laterally. Perisplenic fluid measures simple fluid density. Adrenals/Urinary Tract: Low-density thickening of the right adrenal gland without dominant nodule. Left adrenal gland is normal. There is excreted IV contrast within both renal collecting systems from preceding chest CTA. No hydronephrosis. Excreted IV contrast within the urinary bladder. Bladder is minimally  distended. Stomach/Bowel: Stomach is unremarkable. There is no small bowel obstruction or obvious inflammation. Moderate colonic stool burden. There is no colonic wall thickening or inflammatory change. Appendix tentatively visualized in the right pericolic gutter. Vascular/Lymphatic: Aortic atherosclerosis. No aortic aneurysm. There is no retroperitoneal fluid. Multiple small retroperitoneal lymph nodes are likely reactive. Reproductive: Prominent prostate gland spans 5.3 cm transverse. Other: Moderate volume abdominopelvic ascites. Fluid measures simple fluid density. Mild mesenteric edema and small amount of mesenteric ascites. There is no free air. Small fat containing inguinal hernias. Musculoskeletal: No acute fracture of the lumbar spine, pelvis, or included ribs. Bilateral hip osteoarthritis. IMPRESSION: 1. Cirrhosis  with mild splenomegaly and moderate volume abdominopelvic ascites. 2. Aortic Atherosclerosis (ICD10-I70.0). Electronically Signed   By: Keith Rake M.D.   On: 10/16/2019 23:32   CT Head Wo Contrast  Result Date: 10/16/2019 CLINICAL DATA:  Headache, acute, normal neuro exam EXAM: CT HEAD WITHOUT CONTRAST TECHNIQUE: Contiguous axial images were obtained from the base of the skull through the vertex without intravenous contrast. COMPARISON:  Head CT 5 days ago 10/11/2019 FINDINGS: Brain: No intracranial hemorrhage, mass effect, or midline shift. No hydrocephalus. The basilar cisterns are patent. No evidence of territorial infarct or acute ischemia. No extra-axial or intracranial fluid collection. Vascular: No hyperdense vessel or unexpected calcification. Skull: No fracture or focal lesion. Sinuses/Orbits: Paranasal sinuses and mastoid air cells are clear. The visualized orbits are unremarkable. Other: None. IMPRESSION: Negative noncontrast head CT. Electronically Signed   By: Keith Rake M.D.   On: 10/16/2019 23:21   CT Angio Chest PE W and/or Wo Contrast  Result Date: 10/16/2019 CLINICAL DATA:  Shortness of breath EXAM: CT ANGIOGRAPHY CHEST WITH CONTRAST TECHNIQUE: Multidetector CT imaging of the chest was performed using the standard protocol during bolus administration of intravenous contrast. Multiplanar CT image reconstructions and MIPs were obtained to evaluate the vascular anatomy. CONTRAST:  69m OMNIPAQUE IOHEXOL 350 MG/ML SOLN COMPARISON:  Radiograph 10/16/2019 FINDINGS: Cardiovascular: Satisfactory opacification the pulmonary arteries to the segmental level. No pulmonary artery filling defects are identified. Central pulmonary arteries are normal caliber. Cardiomegaly with four-chamber enlargement. No pericardial effusion. Extensive coronary artery calcifications are present. Minimal plaque in the normal caliber thoracic aorta. No acute luminal abnormality. No periaortic stranding or  hemorrhage. Normal 3 vessel branching. Proximal great vessels are unremarkable aside from minimal plaque at the vertebral artery origins. Mediastinum/Nodes: No mediastinal fluid or gas. Normal thyroid gland and thoracic inlet. No acute abnormality of the trachea. Fluid-filled distal thoracic esophagus. No worrisome mediastinal or hilar adenopathy. There are extensive, asymmetric edematous changes centered upon the left shoulder and axillary soft tissues. No associated axillary adenopathy is evident. Lungs/Pleura: Low lung volumes with dependent atelectatic changes posteriorly. More bandlike areas of opacity are present in the lung bases, likely reflecting further atelectasis or scarring subpleural fat noted in both lung bases, right slightly greater than left. No pneumothorax or visible effusion. No suspicious pulmonary nodules or masses. Upper Abdomen: Moderate volume upper abdominal ascites with a nodular, heterogeneous liver surface contour as well as caudate and left lobe hypertrophy. Upper abdomen is otherwise unremarkable. Musculoskeletal: Soft tissue stranding and edematous changes of the left shoulder and axillary region, as above, likely secondary to recent humeral fracture, outside the level of imaging. No acute osseous or musculotendinous injury is identified within the field of view. Review of the MIP images confirms the above findings. IMPRESSION: 1. No acute pulmonary artery filling defects to suggest pulmonary embolism. 2. Extensive, asymmetric edematous changes centered upon  the left shoulder and axillary soft tissues, likely secondary to recent humeral fracture, outside the level of imaging. 3. Atelectatic changes other acute intrathoracic or traumatic findings in the chest. 4. Cardiomegaly with four-chamber enlargement. Extensive coronary artery calcifications. 5. Moderate volume upper abdominal ascites with a nodular, heterogeneous liver surface contour as well as caudate and left lobe  hypertrophy. Findings are suggestive of cirrhosis and portal hypertension with upper abdominal ascites, findings better detailed on dedicated abdominopelvic CT. 6. Aortic Atherosclerosis (ICD10-I70.0). Electronically Signed   By: Lovena Le M.D.   On: 10/16/2019 23:29   MR BRAIN WO CONTRAST  Result Date: 10/17/2019 CLINICAL DATA:  Shortness of breath. Acute neuro deficit with stroke suspected. Abnormal gait. EXAM: MRI HEAD WITHOUT CONTRAST TECHNIQUE: Multiplanar, multiecho pulse sequences of the brain and surrounding structures were obtained without intravenous contrast. COMPARISON:  Head CT from yesterday FINDINGS: Brain: No acute infarction, hemorrhage, hydrocephalus, extra-axial collection or mass lesion. Increased T1 signal at the basal ganglia correlating with cirrhosis by recent abdominal CT. Small FLAIR hyperintensities randomly scattered in the cerebral white matter, likely chronic small vessel ischemia-mild in extent. Brain volume is normal. Vascular: Normal flow voids accounting for arachnoid granulation at the right transverse sigmoid junction. Skull and upper cervical spine: Normal marrow signal Sinuses/Orbits: Negative IMPRESSION: 1. No acute or focal finding. 2. Globus pallidus mineralization correlating with cirrhosis. Electronically Signed   By: Monte Fantasia M.D.   On: 10/17/2019 07:06   US Carotid Bilateral  Result Date: 10/17/2019 CLINICAL DATA:  Syncope and collapse EXAM: BILATERAL CAROTID DUPLEX ULTRASOUND TECHNIQUE: Pearline Cables scale imaging, color Doppler and duplex ultrasound were performed of bilateral carotid and vertebral arteries in the neck. COMPARISON:  None. FINDINGS: Criteria: Quantification of carotid stenosis is based on velocity parameters that correlate the residual internal carotid diameter with NASCET-based stenosis levels, using the diameter of the distal internal carotid lumen as the denominator for stenosis measurement. The following velocity measurements were obtained:  RIGHT ICA: 89/21 cm/sec CCA: 70/48 cm/sec SYSTOLIC ICA/CCA RATIO:  1.0 ECA: 134 cm/sec LEFT ICA: 80/17 cm/sec CCA: 889/16 cm/sec SYSTOLIC ICA/CCA RATIO:  0.8 ECA: 163 cm/sec RIGHT CAROTID ARTERY: Minor echogenic shadowing plaque formation. No hemodynamically significant right ICA stenosis, velocity elevation, or turbulent flow. Degree of narrowing less than 50%. RIGHT VERTEBRAL ARTERY:  Normal antegrade flow LEFT CAROTID ARTERY: Similar scattered minor echogenic plaque formation. No hemodynamically significant left ICA stenosis, velocity elevation, or turbulent flow. LEFT VERTEBRAL ARTERY:  Normal antegrade flow IMPRESSION: Minor carotid atherosclerosis. No hemodynamically significant ICA stenosis. Degree of narrowing less than 50% bilaterally by ultrasound criteria. Patent antegrade vertebral flow bilaterally Electronically Signed   By: Jerilynn Mages.  Shick M.D.   On: 10/17/2019 10:38   DG Chest Portable 1 View  Result Date: 10/16/2019 CLINICAL DATA:  Shortness of breath EXAM: PORTABLE CHEST 1 VIEW COMPARISON:  None FINDINGS: Low lung volumes with streaky opacities in bases favoring atelectatic change as well as more bandlike opacities in the left mid lung which could reflect further subsegmental atelectasis or scarring. Central vascular crowding is noted. Cardiac silhouette within expected normals for portable technique. The aorta is calcified. The remaining cardiomediastinal contours are unremarkable. No acute osseous or soft tissue abnormality. Degenerative changes are present in the imaged spine and shoulders. Telemetry leads overlie the chest. IMPRESSION: Low lung volumes with basilar atelectasis. Bandlike opacities in the left mid lung may reflect further atelectasis or scarring. Electronically Signed   By: Lovena Le M.D.   On: 10/16/2019 22:47  Assessment/Plan:  64 y.o. male with a history of HTN, HLD and MI who presented with complaints of shortness of breath.  In interview patient also reports multiple  other complaints.  Reports that for the past 3 years has had difficulty with gait and frequent falls.  Fall have become increasingly more frequent.  Patient also reports dizziness, and hallucinations over the past 6 months.  Has noticed tremor in his legs bilaterally.  Does not feel there has been any cognitive decline.  Has difficulty having a bowel movement for the past 3 weeks.  No bowel or bladder incontinence.  Urine has been brown for some time.  On neurological examination patient appears to have a peripheral neuropathy which may have been affecting his gait.  Also has increased tone which may have contributed as well.  Due to lack of evaluation of gait unable to determine if patient has PD which along with peripheral neuropathy may be leading to falls but will evaluate gait further with hospitalization.  Presentation somewhat atypical in that tremor is only in the lower extremities.  Patient does have elevated LFT's and likelihood of cirrhosis.  Many of his other symptoms may be a consequence of whatever is causing this abnormality.  Patient is not a drinker.  MRI of the brain personally reviewed and shows no acute changes but does show mineralization of the globus pallidus which more likely explains the gait abnormalities, increased tone and tremor.  May in fact be contributing to hallucinations as well.    B12 and ammonia are normal.  B1 and RP are pending.    Recommendations: 1. EEG 2. ESR, TSH, copper, SPEP, paraneoplastic panel 3. Agree with continued investigation of etiology of liver abnormalities 4. PT/OT 5. Patient on multiple antidepressants. Would consider discontinuation of Prozac for now and possibly start taper of others later. 6. Would consider discontinuation of lipid lower agents.    Alexis Goodell, MD Neurology [phone] 10/17/2019, 3:44 PM

## 2019-10-17 NOTE — ED Notes (Signed)
Pt encouraged to provide a urine sample when able

## 2019-10-18 ENCOUNTER — Inpatient Hospital Stay: Payer: Medicare Other

## 2019-10-18 DIAGNOSIS — F419 Anxiety disorder, unspecified: Secondary | ICD-10-CM

## 2019-10-18 DIAGNOSIS — R188 Other ascites: Secondary | ICD-10-CM

## 2019-10-18 DIAGNOSIS — R410 Disorientation, unspecified: Secondary | ICD-10-CM

## 2019-10-18 DIAGNOSIS — Z9181 History of falling: Secondary | ICD-10-CM

## 2019-10-18 DIAGNOSIS — I252 Old myocardial infarction: Secondary | ICD-10-CM

## 2019-10-18 DIAGNOSIS — K7460 Unspecified cirrhosis of liver: Secondary | ICD-10-CM

## 2019-10-18 DIAGNOSIS — F329 Major depressive disorder, single episode, unspecified: Secondary | ICD-10-CM

## 2019-10-18 LAB — URINALYSIS, COMPLETE (UACMP) WITH MICROSCOPIC
Bilirubin Urine: NEGATIVE
Glucose, UA: NEGATIVE mg/dL
Hgb urine dipstick: NEGATIVE
Ketones, ur: NEGATIVE mg/dL
Leukocytes,Ua: NEGATIVE
Nitrite: NEGATIVE
Protein, ur: NEGATIVE mg/dL
Specific Gravity, Urine: 1.011 (ref 1.005–1.030)
Squamous Epithelial / HPF: NONE SEEN (ref 0–5)
pH: 5 (ref 5.0–8.0)

## 2019-10-18 LAB — GLUCOSE, CAPILLARY: Glucose-Capillary: 100 mg/dL — ABNORMAL HIGH (ref 70–99)

## 2019-10-18 LAB — CK: Total CK: 114 U/L (ref 49–397)

## 2019-10-18 LAB — HEPATITIS PANEL, ACUTE
HCV Ab: NONREACTIVE
Hep A IgM: NONREACTIVE
Hep B C IgM: NONREACTIVE
Hepatitis B Surface Ag: NONREACTIVE

## 2019-10-18 LAB — RPR: RPR Ser Ql: NONREACTIVE

## 2019-10-18 MED ORDER — SENNOSIDES-DOCUSATE SODIUM 8.6-50 MG PO TABS
1.0000 | ORAL_TABLET | Freq: Every evening | ORAL | Status: DC | PRN
  Administered 2019-10-21: 22:00:00 1 via ORAL
  Filled 2019-10-18: qty 1

## 2019-10-18 MED ORDER — POLYETHYLENE GLYCOL 3350 17 G PO PACK: 17.0000 g | PACK | Freq: Every day | ORAL | Status: DC

## 2019-10-18 MED ORDER — POLYETHYLENE GLYCOL 3350 17 G PO PACK
17.0000 g | PACK | Freq: Every day | ORAL | Status: DC
  Administered 2019-10-19: 17 g via ORAL
  Filled 2019-10-18: qty 1

## 2019-10-18 NOTE — Progress Notes (Addendum)
Progress Note    Alex Hampton  DJM:[id number] DOB: Jul 27, 1955  DOA: 10/16/2019 PCP: Juluis Pitch, MD      Brief Narrative:    Medical records reviewed and are as summarized below:  Alex Hampton is a 64 y.o. male       Assessment/Plan:   Active Problems:   Gait abnormality   History of fall within past 90 days   Hallucinations   Cirrhosis of liver with ascites ([place]) on CT   HTN (hypertension)   History of MI (myocardial infarction)   OSA (obstructive sleep apnea)   Nondisplaced comminuted supracondylar fracture without intercondylar fracture of left humerus, subsequent encounter for fracture with nonunion   Anxiety and depression   Confusion and disorientation   Mineralization of the globus pallidus, unsteady gait, hallucinations: EEG is pending.  Plan discussed with Dr. Doy Mince, neurologist.  She thinks unsteady gait, hallucinations and increased muscle tone may be due to mineralization of the globus pallidus which could be coming from liver cirrhosis.  RPR, TSH and CK level were normal.  PT and OT evaluation.  Liver cirrhosis with ascites: Consulted gastroenterologist.  Acute hepatitis panel ordered today was negative.  Ultrasound-guided paracentesis with ascitic fluid analysis.  Discontinue lisinopril.  Recent fall leading to displaced comminuted left humeral fracture on 10/11/2019: Analgesics as needed for pain.  Outpatient follow-up with orthopedic surgeon.  CAD with history of angioplasty: Continue aspirin, metoprolol and Vytorin.  Anxiety and depression: Continue psychotropics  OSA: Nonadherent with CPAP.  Body mass index is 32.29 kg/m.  Diet Order            Diet Heart Room service appropriate? Yes; Fluid consistency: Thin  Diet effective now                       Medications:   . aspirin EC  81 mg Oral Daily  . buPROPion  150 mg Oral Daily  . enoxaparin (LOVENOX) injection  40 mg Subcutaneous Q24H  . ezetimibe  10 mg Oral QHS    And  . simvastatin  80 mg Oral QHS  . FLUoxetine  10 mg Oral Daily  . montelukast  10 mg Oral Daily  . pantoprazole  40 mg Oral BID  . [START ON 10/19/2019] polyethylene glycol  17 g Oral QHS  . sodium chloride flush  3 mL Intravenous Q12H  . traZODone  300 mg Oral QHS  . venlafaxine XR  225 mg Oral Daily   Continuous Infusions:   Anti-infectives (From admission, onward)   None             Family Communication/Anticipated D/C date and plan/Code Status   DVT prophylaxis: enoxaparin (LOVENOX) injection 40 mg Start: 10/17/19 1000     Code Status: Full Code  Family Communication: Plan discussed with his daughter and son-in-law at the bedside Disposition Plan:    Status is: Inpatient  Remains inpatient appropriate because:Inpatient level of care appropriate due to severity of illness   Dispo: The patient is from: Home              Anticipated d/c is to: SNF              Anticipated d/c date is: 3 days              Patient currently is not medically stable to d/c.           Subjective:   C/o not feeling well.  He  has pain in his left shoulder.  No abdominal pain or shortness of breath.  His daughter said that patient had hepatitis B in childhood and it runs in the family.  Objective:    Vitals:   10/18/19 0416 10/18/19 0500 10/18/19 0825 10/18/19 1152  BP: 140/79  97/62 (!) 107/56  Pulse: 81  86 77  Resp: 15  17 16   Temp: 98.1 F (36.7 C)  98.3 F (36.8 C) 98.3 F (36.8 C)  TempSrc: Oral  Oral Oral  SpO2: 98%  97% 95%  Weight:  111 kg    Height:       No data found.  No intake or output data in the 24 hours ending 10/18/19 1436 Filed Weights   10/16/19 2150 10/18/19 0500  Weight: 111.1 kg 111 kg    Exam:  GEN: NAD SKIN: No rash EYES: EOMI ENT: MMM CV: RRR PULM: CTA B ABD: soft, ND, NT, +BS CNS: AAO x 3, non focal.  Mild flapping tremor of right hand. EXT: Left shoulder is swollen and tender.  There is ecchymosis on the left  shoulder and left arm.  No lower extremity swelling or tenderness. PSYCH: Calm and cooperative    Data Reviewed:   I have personally reviewed following labs and imaging studies:  Labs: Labs show the following:   Basic Metabolic Panel: Recent Labs  Lab 10/16/19 2205 10/17/19 0858  NA 133*  --   K 4.7  --   CL 100  --   CO2 24  --   GLUCOSE 118*  --   BUN 13  --   CREATININE 0.85 0.80  CALCIUM 8.4*  --    GFR Estimated Creatinine Clearance: 120.2 mL/min (by C-G formula based on SCr of 0.8 mg/dL). Liver Function Tests: Recent Labs  Lab 10/16/19 2205  AST 81*  ALT 37  ALKPHOS 177*  BILITOT 2.9*  PROT 6.2*  ALBUMIN 2.7*   No results for input(s): LIPASE, AMYLASE in the last 168 hours. Recent Labs  Lab 10/17/19 0217  AMMONIA 30   Coagulation profile Recent Labs  Lab 10/17/19 0121  INR 1.5*    CBC: Recent Labs  Lab 10/16/19 2205 10/17/19 0858  WBC 9.5 8.8  NEUTROABS 6.7  --   HGB 14.6 13.0  HCT 40.7 36.2*  MCV 96.7 97.8  PLT 198 166   Cardiac Enzymes: Recent Labs  Lab 10/18/19 0759  CKTOTAL 114   BNP (last 3 results) No results for input(s): PROBNP in the last 8760 hours. CBG: Recent Labs  Lab 10/18/19 0454  GLUCAP 100*   D-Dimer: No results for input(s): DDIMER in the last 72 hours. Hgb A1c: No results for input(s): HGBA1C in the last 72 hours. Lipid Profile: No results for input(s): CHOL, HDL, LDLCALC, TRIG, CHOLHDL, LDLDIRECT in the last 72 hours. Thyroid function studies: Recent Labs    10/17/19 1638  TSH 2.133   Anemia work up: Recent Labs    10/17/19 0121  VITAMINB12 973*   Sepsis Labs: Recent Labs  Lab 10/16/19 2205 10/17/19 0858  WBC 9.5 8.8    Microbiology Recent Results (from the past 240 hour(s))  SARS Coronavirus 2 by RT PCR (hospital order, performed in [facility] hospital lab) Nasopharyngeal Nasopharyngeal Swab     Status: None   Collection Time: 10/17/19  9:22 AM   Specimen: Nasopharyngeal Swab    Result Value Ref Range Status   SARS Coronavirus 2 NEGATIVE NEGATIVE Final    Comment: (NOTE) SARS-CoV-2 target nucleic  acids are NOT DETECTED.  The SARS-CoV-2 RNA is generally detectable in upper and lower respiratory specimens during the acute phase of infection. The lowest concentration of SARS-CoV-2 viral copies this assay can detect is 250 copies / mL. A negative result does not preclude SARS-CoV-2 infection and should not be used as the sole basis for treatment or other patient management decisions.  A negative result may occur with improper specimen collection / handling, submission of specimen other than nasopharyngeal swab, presence of viral mutation(s) within the areas targeted by this assay, and inadequate number of viral copies (<250 copies / mL). A negative result must be combined with clinical observations, patient history, and epidemiological information.  Fact Sheet for Patients:   StrictlyIdeas.no  Fact Sheet for Healthcare Providers: BankingDealers.co.za  This test is not yet approved or  cleared by the [place] FDA and has been authorized for detection and/or diagnosis of SARS-CoV-2 by FDA under an Emergency Use Authorization (EUA).  This EUA will remain in effect (meaning this test can be used) for the duration of the COVID-19 declaration under Section 564(b)(1) of the Act, 21 U.S.C. section 360bbb-3(b)(1), unless the authorization is terminated or revoked sooner.  Performed at [facility], [address] [place], [place] [address]     Procedures and diagnostic studies:  CT ABDOMEN PELVIS WO CONTRAST  Result Date: 10/16/2019 CLINICAL DATA:  Abdominal distension. Patient had recent fall. EXAM: CT ABDOMEN AND PELVIS WITHOUT CONTRAST TECHNIQUE: Multidetector CT imaging of the abdomen and pelvis was performed following the standard protocol without IV contrast. COMPARISON:  Included portion from  chest CTA earlier today. FINDINGS: Lower chest: Assessed on concurrent chest CT. Hepatobiliary: Nodular hepatic contours consistent with cirrhosis. No focal abnormality on noncontrast exam. Distended gallbladder without gallstones. No biliary dilatation. Perihepatic ascites measures simple fluid density. Pancreas: No ductal dilatation. No discrete peripancreatic inflammation. Spleen: Mildly enlarged spanning 14.4 cm. Small cleft laterally. Perisplenic fluid measures simple fluid density. Adrenals/Urinary Tract: Low-density thickening of the right adrenal gland without dominant nodule. Left adrenal gland is normal. There is excreted IV contrast within both renal collecting systems from preceding chest CTA. No hydronephrosis. Excreted IV contrast within the urinary bladder. Bladder is minimally distended. Stomach/Bowel: Stomach is unremarkable. There is no small bowel obstruction or obvious inflammation. Moderate colonic stool burden. There is no colonic wall thickening or inflammatory change. Appendix tentatively visualized in the right pericolic gutter. Vascular/Lymphatic: Aortic atherosclerosis. No aortic aneurysm. There is no retroperitoneal fluid. Multiple small retroperitoneal lymph nodes are likely reactive. Reproductive: Prominent prostate gland spans 5.3 cm transverse. Other: Moderate volume abdominopelvic ascites. Fluid measures simple fluid density. Mild mesenteric edema and small amount of mesenteric ascites. There is no free air. Small fat containing inguinal hernias. Musculoskeletal: No acute fracture of the lumbar spine, pelvis, or included ribs. Bilateral hip osteoarthritis. IMPRESSION: 1. Cirrhosis with mild splenomegaly and moderate volume abdominopelvic ascites. 2. Aortic Atherosclerosis (ICD10-I70.0). Electronically Signed   By: Keith Rake M.D.   On: 10/16/2019 23:32   CT Head Wo Contrast  Result Date: 10/16/2019 CLINICAL DATA:  Headache, acute, normal neuro exam EXAM: CT HEAD WITHOUT  CONTRAST TECHNIQUE: Contiguous axial images were obtained from the base of the skull through the vertex without intravenous contrast. COMPARISON:  Head CT 5 days ago 10/11/2019 FINDINGS: Brain: No intracranial hemorrhage, mass effect, or midline shift. No hydrocephalus. The basilar cisterns are patent. No evidence of territorial infarct or acute ischemia. No extra-axial or intracranial fluid collection. Vascular: No hyperdense vessel or unexpected  calcification. Skull: No fracture or focal lesion. Sinuses/Orbits: Paranasal sinuses and mastoid air cells are clear. The visualized orbits are unremarkable. Other: None. IMPRESSION: Negative noncontrast head CT. Electronically Signed   By: Keith Rake M.D.   On: 10/16/2019 23:21   CT Angio Chest PE W and/or Wo Contrast  Result Date: 10/16/2019 CLINICAL DATA:  Shortness of breath EXAM: CT ANGIOGRAPHY CHEST WITH CONTRAST TECHNIQUE: Multidetector CT imaging of the chest was performed using the standard protocol during bolus administration of intravenous contrast. Multiplanar CT image reconstructions and MIPs were obtained to evaluate the vascular anatomy. CONTRAST:  37mL OMNIPAQUE IOHEXOL 350 MG/ML SOLN COMPARISON:  Radiograph 10/16/2019 FINDINGS: Cardiovascular: Satisfactory opacification the pulmonary arteries to the segmental level. No pulmonary artery filling defects are identified. Central pulmonary arteries are normal caliber. Cardiomegaly with four-chamber enlargement. No pericardial effusion. Extensive coronary artery calcifications are present. Minimal plaque in the normal caliber thoracic aorta. No acute luminal abnormality. No periaortic stranding or hemorrhage. Normal 3 vessel branching. Proximal great vessels are unremarkable aside from minimal plaque at the vertebral artery origins. Mediastinum/Nodes: No mediastinal fluid or gas. Normal thyroid gland and thoracic inlet. No acute abnormality of the trachea. Fluid-filled distal thoracic esophagus. No  worrisome mediastinal or hilar adenopathy. There are extensive, asymmetric edematous changes centered upon the left shoulder and axillary soft tissues. No associated axillary adenopathy is evident. Lungs/Pleura: Low lung volumes with dependent atelectatic changes posteriorly. More bandlike areas of opacity are present in the lung bases, likely reflecting further atelectasis or scarring subpleural fat noted in both lung bases, right slightly greater than left. No pneumothorax or visible effusion. No suspicious pulmonary nodules or masses. Upper Abdomen: Moderate volume upper abdominal ascites with a nodular, heterogeneous liver surface contour as well as caudate and left lobe hypertrophy. Upper abdomen is otherwise unremarkable. Musculoskeletal: Soft tissue stranding and edematous changes of the left shoulder and axillary region, as above, likely secondary to recent humeral fracture, outside the level of imaging. No acute osseous or musculotendinous injury is identified within the field of view. Review of the MIP images confirms the above findings. IMPRESSION: 1. No acute pulmonary artery filling defects to suggest pulmonary embolism. 2. Extensive, asymmetric edematous changes centered upon the left shoulder and axillary soft tissues, likely secondary to recent humeral fracture, outside the level of imaging. 3. Atelectatic changes other acute intrathoracic or traumatic findings in the chest. 4. Cardiomegaly with four-chamber enlargement. Extensive coronary artery calcifications. 5. Moderate volume upper abdominal ascites with a nodular, heterogeneous liver surface contour as well as caudate and left lobe hypertrophy. Findings are suggestive of cirrhosis and portal hypertension with upper abdominal ascites, findings better detailed on dedicated abdominopelvic CT. 6. Aortic Atherosclerosis (ICD10-I70.0). Electronically Signed   By: Lovena Le M.D.   On: 10/16/2019 23:29   MR BRAIN WO CONTRAST  Result Date:  10/17/2019 CLINICAL DATA:  Shortness of breath. Acute neuro deficit with stroke suspected. Abnormal gait. EXAM: MRI HEAD WITHOUT CONTRAST TECHNIQUE: Multiplanar, multiecho pulse sequences of the brain and surrounding structures were obtained without intravenous contrast. COMPARISON:  Head CT from yesterday FINDINGS: Brain: No acute infarction, hemorrhage, hydrocephalus, extra-axial collection or mass lesion. Increased T1 signal at the basal ganglia correlating with cirrhosis by recent abdominal CT. Small FLAIR hyperintensities randomly scattered in the cerebral white matter, likely chronic small vessel ischemia-mild in extent. Brain volume is normal. Vascular: Normal flow voids accounting for arachnoid granulation at the right transverse sigmoid junction. Skull and upper cervical spine: Normal marrow signal Sinuses/Orbits: Negative IMPRESSION: 1.  No acute or focal finding. 2. Globus pallidus mineralization correlating with cirrhosis. Electronically Signed   By: Monte Fantasia M.D.   On: 10/17/2019 07:06   US Carotid Bilateral  Result Date: 10/17/2019 CLINICAL DATA:  Syncope and collapse EXAM: BILATERAL CAROTID DUPLEX ULTRASOUND TECHNIQUE: Pearline Cables scale imaging, color Doppler and duplex ultrasound were performed of bilateral carotid and vertebral arteries in the neck. COMPARISON:  None. FINDINGS: Criteria: Quantification of carotid stenosis is based on velocity parameters that correlate the residual internal carotid diameter with NASCET-based stenosis levels, using the diameter of the distal internal carotid lumen as the denominator for stenosis measurement. The following velocity measurements were obtained: RIGHT ICA: 89/21 cm/sec CCA: 07/37 cm/sec SYSTOLIC ICA/CCA RATIO:  1.0 ECA: 134 cm/sec LEFT ICA: 80/17 cm/sec CCA: 106/26 cm/sec SYSTOLIC ICA/CCA RATIO:  0.8 ECA: 163 cm/sec RIGHT CAROTID ARTERY: Minor echogenic shadowing plaque formation. No hemodynamically significant right ICA stenosis, velocity elevation, or  turbulent flow. Degree of narrowing less than 50%. RIGHT VERTEBRAL ARTERY:  Normal antegrade flow LEFT CAROTID ARTERY: Similar scattered minor echogenic plaque formation. No hemodynamically significant left ICA stenosis, velocity elevation, or turbulent flow. LEFT VERTEBRAL ARTERY:  Normal antegrade flow IMPRESSION: Minor carotid atherosclerosis. No hemodynamically significant ICA stenosis. Degree of narrowing less than 50% bilaterally by ultrasound criteria. Patent antegrade vertebral flow bilaterally Electronically Signed   By: Jerilynn Mages.  Shick M.D.   On: 10/17/2019 10:38   DG Chest Portable 1 View  Result Date: 10/16/2019 CLINICAL DATA:  Shortness of breath EXAM: PORTABLE CHEST 1 VIEW COMPARISON:  None FINDINGS: Low lung volumes with streaky opacities in bases favoring atelectatic change as well as more bandlike opacities in the left mid lung which could reflect further subsegmental atelectasis or scarring. Central vascular crowding is noted. Cardiac silhouette within expected normals for portable technique. The aorta is calcified. The remaining cardiomediastinal contours are unremarkable. No acute osseous or soft tissue abnormality. Degenerative changes are present in the imaged spine and shoulders. Telemetry leads overlie the chest. IMPRESSION: Low lung volumes with basilar atelectasis. Bandlike opacities in the left mid lung may reflect further atelectasis or scarring. Electronically Signed   By: Lovena Le M.D.   On: 10/16/2019 22:47   ECHOCARDIOGRAM COMPLETE  Result Date: 10/17/2019    ECHOCARDIOGRAM REPORT   Patient Name:   JONPAUL LUMM Date of Exam: 10/17/2019 Medical Rec #:  [id number]     Height:       73.0 in Accession #:    [id number]    Weight:       245.0 lb Date of Birth:  12/10/1955      BSA:          2.345 m Patient Age:    57 years      BP:           129/68 mmHg Patient Gender: M             HR:           58 bpm. Exam Location:  [facility] Procedure: 2D Echo, Color Doppler and Cardiac Doppler  Indications:     Syncope 780.2  History:         Patient has no prior history of Echocardiogram examinations.                  Previous Myocardial Infarction; Risk Factors:Hypertension.  Sonographer:     Sherrie Sport RDCS (AE) Referring Phys:  [id number] Athena Masse Diagnosing Phys: Serafina Royals MD  Sonographer Comments: Technically challenging study  due to limited acoustic windows, no apical window and no subcostal window. Patient has fractured left shoulder and left arm is in a sling- kept supine. IMPRESSIONS  1. Left ventricular ejection fraction, by estimation, is 50 to 55%. The left ventricle has low normal function. The left ventricle has no regional wall motion abnormalities. Left ventricular diastolic function could not be evaluated.  2. Right ventricular systolic function is normal. The right ventricular size is normal.  3. The mitral valve was not well visualized. No evidence of mitral valve regurgitation.  4. The aortic valve is normal in structure. Aortic valve regurgitation is not visualized. FINDINGS  Left Ventricle: Left ventricular ejection fraction, by estimation, is 50 to 55%. The left ventricle has low normal function. The left ventricle has no regional wall motion abnormalities. The left ventricular internal cavity size was normal in size. There is no left ventricular hypertrophy. Left ventricular diastolic function could not be evaluated. Right Ventricle: The right ventricular size is normal. No increase in right ventricular wall thickness. Right ventricular systolic function is normal. Left Atrium: Left atrial size was normal in size. Right Atrium: Right atrial size was normal in size. Pericardium: The pericardium was not assessed. Mitral Valve: The mitral valve was not well visualized. No evidence of mitral valve regurgitation. Tricuspid Valve: The tricuspid valve is not well visualized. Tricuspid valve regurgitation is not demonstrated. Aortic Valve: The aortic valve is normal in structure.  Aortic valve regurgitation is not visualized. Pulmonic Valve: The pulmonic valve was not well visualized. Pulmonic valve regurgitation is not visualized. Aorta: The aortic root was not well visualized. IAS/Shunts: The interatrial septum was not assessed.  LEFT VENTRICLE PLAX 2D LVIDd:         4.71 cm LVIDs:         3.22 cm LV PW:         1.55 cm LV IVS:        1.61 cm LVOT diam:     2.20 cm LVOT Area:     3.80 cm  LEFT ATRIUM         Index LA diam:    4.20 cm 1.79 cm/m                        PULMONIC VALVE AORTA                 PV Vmax:        0.91 m/s Ao Root diam: 3.70 cm PV Peak grad:   3.3 mmHg                       RVOT Peak grad: 5 mmHg   SHUNTS Systemic Diam: 2.20 cm Serafina Royals MD Electronically signed by Serafina Royals MD Signature Date/Time: 10/17/2019/7:04:53 PM    Final                LOS: 1 day   Jarid Sasso  [facility]     10/18/2019, 2:36 PM

## 2019-10-18 NOTE — Progress Notes (Signed)
Subjective: No new neurological complaints.  Remains stable.  Objective: Current vital signs: BP 97/62 (BP Location: Right Arm)   Pulse 86   Temp 98.3 F (36.8 C) (Oral)   Resp 17   Ht '6\' 1"'  (1.854 m)   Wt 111 kg   SpO2 97%   BMI 32.29 kg/m  Vital signs in last 24 hours: Temp:  [97.8 F (36.6 C)-98.3 F (36.8 C)] 98.3 F (36.8 C) (07/10 0825) Pulse Rate:  [62-86] 86 (07/10 0825) Resp:  [15-19] 17 (07/10 0825) BP: (85-140)/(62-79) 97/62 (07/10 0825) SpO2:  [95 %-100 %] 97 % (07/10 0825) Weight:  [710 kg] 111 kg (07/10 0500)  Intake/Output from previous day: No intake/output data recorded. Intake/Output this shift: No intake/output data recorded. Nutritional status:  Diet Order            Diet Heart Room service appropriate? Yes; Fluid consistency: Thin  Diet effective now                 Neurologic Exam: Mental Status: Alert, oriented, thought content appropriate.  Speech fluent without evidence of aphasia.  Able to follow 3 step commands without difficulty. Cranial Nerves: II: Discs flat bilaterally; Visual fields grossly normal, pupils equal, round, reactive to light and accommodation III,IV, VI: ptosis not present, extra-ocular motions intact bilaterally V,VII: smile symmetric, facial light touch sensation normal bilaterally VIII: hearing normal bilaterally IX,X: gag reflex present XI: bilateral shoulder shrug XII: midline tongue extension Motor: 5/5 strength in the RUE.  LUE in a sling.  4+-5-/5 strength in the hip flexors bilaterally with 5/5 otherwise in the BLE's.  Increased tone throughout.   Sensory: Pinprick and light touch decreased in the right foot Cerebellar: Mild dysmetria with heel to shin testing using the RLE   Lab Results: Basic Metabolic Panel: Recent Labs  Lab 10/16/19 2205 10/17/19 0858  NA 133*  --   K 4.7  --   CL 100  --   CO2 24  --   GLUCOSE 118*  --   BUN 13  --   CREATININE 0.85 0.80  CALCIUM 8.4*  --     Liver  Function Tests: Recent Labs  Lab 10/16/19 2205  AST 81*  ALT 37  ALKPHOS 177*  BILITOT 2.9*  PROT 6.2*  ALBUMIN 2.7*   No results for input(s): LIPASE, AMYLASE in the last 168 hours. Recent Labs  Lab 10/17/19 0217  AMMONIA 30    CBC: Recent Labs  Lab 10/16/19 2205 10/17/19 0858  WBC 9.5 8.8  NEUTROABS 6.7  --   HGB 14.6 13.0  HCT 40.7 36.2*  MCV 96.7 97.8  PLT 198 166    Cardiac Enzymes: Recent Labs  Lab 10/18/19 0759  CKTOTAL 114    Lipid Panel: No results for input(s): CHOL, TRIG, HDL, CHOLHDL, VLDL, LDLCALC in the last 168 hours.  CBG: Recent Labs  Lab 10/18/19 0454  GLUCAP 100*    Microbiology: Results for orders placed or performed during the hospital encounter of 10/16/19  SARS Coronavirus 2 by RT PCR (hospital order, performed in [facility] hospital lab) Nasopharyngeal Nasopharyngeal Swab     Status: None   Collection Time: 10/17/19  9:22 AM   Specimen: Nasopharyngeal Swab  Result Value Ref Range Status   SARS Coronavirus 2 NEGATIVE NEGATIVE Final    Comment: (NOTE) SARS-CoV-2 target nucleic acids are NOT DETECTED.  The SARS-CoV-2 RNA is generally detectable in upper and lower respiratory specimens during the acute phase of infection. The lowest  concentration of SARS-CoV-2 viral copies this assay can detect is 250 copies / mL. A negative result does not preclude SARS-CoV-2 infection and should not be used as the sole basis for treatment or other patient management decisions.  A negative result may occur with improper specimen collection / handling, submission of specimen other than nasopharyngeal swab, presence of viral mutation(s) within the areas targeted by this assay, and inadequate number of viral copies (<250 copies / mL). A negative result must be combined with clinical observations, patient history, and epidemiological information.  Fact Sheet for Patients:   StrictlyIdeas.no  Fact Sheet for Healthcare  Providers: BankingDealers.co.za  This test is not yet approved or  cleared by the [place] FDA and has been authorized for detection and/or diagnosis of SARS-CoV-2 by FDA under an Emergency Use Authorization (EUA).  This EUA will remain in effect (meaning this test can be used) for the duration of the COVID-19 declaration under Section 564(b)(1) of the Act, 21 U.S.C. section 360bbb-3(b)(1), unless the authorization is terminated or revoked sooner.  Performed at [facility], [address] [place], [place] [address]     Coagulation Studies: Recent Labs    10/17/19 0121  LABPROT 17.8*  INR 1.5*    Imaging: CT ABDOMEN PELVIS WO CONTRAST  Result Date: 10/16/2019 CLINICAL DATA:  Abdominal distension. Patient had recent fall. EXAM: CT ABDOMEN AND PELVIS WITHOUT CONTRAST TECHNIQUE: Multidetector CT imaging of the abdomen and pelvis was performed following the standard protocol without IV contrast. COMPARISON:  Included portion from chest CTA earlier today. FINDINGS: Lower chest: Assessed on concurrent chest CT. Hepatobiliary: Nodular hepatic contours consistent with cirrhosis. No focal abnormality on noncontrast exam. Distended gallbladder without gallstones. No biliary dilatation. Perihepatic ascites measures simple fluid density. Pancreas: No ductal dilatation. No discrete peripancreatic inflammation. Spleen: Mildly enlarged spanning 14.4 cm. Small cleft laterally. Perisplenic fluid measures simple fluid density. Adrenals/Urinary Tract: Low-density thickening of the right adrenal gland without dominant nodule. Left adrenal gland is normal. There is excreted IV contrast within both renal collecting systems from preceding chest CTA. No hydronephrosis. Excreted IV contrast within the urinary bladder. Bladder is minimally distended. Stomach/Bowel: Stomach is unremarkable. There is no small bowel obstruction or obvious inflammation. Moderate colonic stool burden.  There is no colonic wall thickening or inflammatory change. Appendix tentatively visualized in the right pericolic gutter. Vascular/Lymphatic: Aortic atherosclerosis. No aortic aneurysm. There is no retroperitoneal fluid. Multiple small retroperitoneal lymph nodes are likely reactive. Reproductive: Prominent prostate gland spans 5.3 cm transverse. Other: Moderate volume abdominopelvic ascites. Fluid measures simple fluid density. Mild mesenteric edema and small amount of mesenteric ascites. There is no free air. Small fat containing inguinal hernias. Musculoskeletal: No acute fracture of the lumbar spine, pelvis, or included ribs. Bilateral hip osteoarthritis. IMPRESSION: 1. Cirrhosis with mild splenomegaly and moderate volume abdominopelvic ascites. 2. Aortic Atherosclerosis (ICD10-I70.0). Electronically Signed   By: Keith Rake M.D.   On: 10/16/2019 23:32   CT Head Wo Contrast  Result Date: 10/16/2019 CLINICAL DATA:  Headache, acute, normal neuro exam EXAM: CT HEAD WITHOUT CONTRAST TECHNIQUE: Contiguous axial images were obtained from the base of the skull through the vertex without intravenous contrast. COMPARISON:  Head CT 5 days ago 10/11/2019 FINDINGS: Brain: No intracranial hemorrhage, mass effect, or midline shift. No hydrocephalus. The basilar cisterns are patent. No evidence of territorial infarct or acute ischemia. No extra-axial or intracranial fluid collection. Vascular: No hyperdense vessel or unexpected calcification. Skull: No fracture or focal lesion. Sinuses/Orbits: Paranasal sinuses and  mastoid air cells are clear. The visualized orbits are unremarkable. Other: None. IMPRESSION: Negative noncontrast head CT. Electronically Signed   By: Keith Rake M.D.   On: 10/16/2019 23:21   CT Angio Chest PE W and/or Wo Contrast  Result Date: 10/16/2019 CLINICAL DATA:  Shortness of breath EXAM: CT ANGIOGRAPHY CHEST WITH CONTRAST TECHNIQUE: Multidetector CT imaging of the chest was performed  using the standard protocol during bolus administration of intravenous contrast. Multiplanar CT image reconstructions and MIPs were obtained to evaluate the vascular anatomy. CONTRAST:  5m OMNIPAQUE IOHEXOL 350 MG/ML SOLN COMPARISON:  Radiograph 10/16/2019 FINDINGS: Cardiovascular: Satisfactory opacification the pulmonary arteries to the segmental level. No pulmonary artery filling defects are identified. Central pulmonary arteries are normal caliber. Cardiomegaly with four-chamber enlargement. No pericardial effusion. Extensive coronary artery calcifications are present. Minimal plaque in the normal caliber thoracic aorta. No acute luminal abnormality. No periaortic stranding or hemorrhage. Normal 3 vessel branching. Proximal great vessels are unremarkable aside from minimal plaque at the vertebral artery origins. Mediastinum/Nodes: No mediastinal fluid or gas. Normal thyroid gland and thoracic inlet. No acute abnormality of the trachea. Fluid-filled distal thoracic esophagus. No worrisome mediastinal or hilar adenopathy. There are extensive, asymmetric edematous changes centered upon the left shoulder and axillary soft tissues. No associated axillary adenopathy is evident. Lungs/Pleura: Low lung volumes with dependent atelectatic changes posteriorly. More bandlike areas of opacity are present in the lung bases, likely reflecting further atelectasis or scarring subpleural fat noted in both lung bases, right slightly greater than left. No pneumothorax or visible effusion. No suspicious pulmonary nodules or masses. Upper Abdomen: Moderate volume upper abdominal ascites with a nodular, heterogeneous liver surface contour as well as caudate and left lobe hypertrophy. Upper abdomen is otherwise unremarkable. Musculoskeletal: Soft tissue stranding and edematous changes of the left shoulder and axillary region, as above, likely secondary to recent humeral fracture, outside the level of imaging. No acute osseous or  musculotendinous injury is identified within the field of view. Review of the MIP images confirms the above findings. IMPRESSION: 1. No acute pulmonary artery filling defects to suggest pulmonary embolism. 2. Extensive, asymmetric edematous changes centered upon the left shoulder and axillary soft tissues, likely secondary to recent humeral fracture, outside the level of imaging. 3. Atelectatic changes other acute intrathoracic or traumatic findings in the chest. 4. Cardiomegaly with four-chamber enlargement. Extensive coronary artery calcifications. 5. Moderate volume upper abdominal ascites with a nodular, heterogeneous liver surface contour as well as caudate and left lobe hypertrophy. Findings are suggestive of cirrhosis and portal hypertension with upper abdominal ascites, findings better detailed on dedicated abdominopelvic CT. 6. Aortic Atherosclerosis (ICD10-I70.0). Electronically Signed   By: PLovena LeM.D.   On: 10/16/2019 23:29   MR BRAIN WO CONTRAST  Result Date: 10/17/2019 CLINICAL DATA:  Shortness of breath. Acute neuro deficit with stroke suspected. Abnormal gait. EXAM: MRI HEAD WITHOUT CONTRAST TECHNIQUE: Multiplanar, multiecho pulse sequences of the brain and surrounding structures were obtained without intravenous contrast. COMPARISON:  Head CT from yesterday FINDINGS: Brain: No acute infarction, hemorrhage, hydrocephalus, extra-axial collection or mass lesion. Increased T1 signal at the basal ganglia correlating with cirrhosis by recent abdominal CT. Small FLAIR hyperintensities randomly scattered in the cerebral white matter, likely chronic small vessel ischemia-mild in extent. Brain volume is normal. Vascular: Normal flow voids accounting for arachnoid granulation at the right transverse sigmoid junction. Skull and upper cervical spine: Normal marrow signal Sinuses/Orbits: Negative IMPRESSION: 1. No acute or focal finding. 2. Globus pallidus mineralization correlating with  cirrhosis.  Electronically Signed   By: Monte Fantasia M.D.   On: 10/17/2019 07:06   US Carotid Bilateral  Result Date: 10/17/2019 CLINICAL DATA:  Syncope and collapse EXAM: BILATERAL CAROTID DUPLEX ULTRASOUND TECHNIQUE: Pearline Cables scale imaging, color Doppler and duplex ultrasound were performed of bilateral carotid and vertebral arteries in the neck. COMPARISON:  None. FINDINGS: Criteria: Quantification of carotid stenosis is based on velocity parameters that correlate the residual internal carotid diameter with NASCET-based stenosis levels, using the diameter of the distal internal carotid lumen as the denominator for stenosis measurement. The following velocity measurements were obtained: RIGHT ICA: 89/21 cm/sec CCA: 40/98 cm/sec SYSTOLIC ICA/CCA RATIO:  1.0 ECA: 134 cm/sec LEFT ICA: 80/17 cm/sec CCA: 119/14 cm/sec SYSTOLIC ICA/CCA RATIO:  0.8 ECA: 163 cm/sec RIGHT CAROTID ARTERY: Minor echogenic shadowing plaque formation. No hemodynamically significant right ICA stenosis, velocity elevation, or turbulent flow. Degree of narrowing less than 50%. RIGHT VERTEBRAL ARTERY:  Normal antegrade flow LEFT CAROTID ARTERY: Similar scattered minor echogenic plaque formation. No hemodynamically significant left ICA stenosis, velocity elevation, or turbulent flow. LEFT VERTEBRAL ARTERY:  Normal antegrade flow IMPRESSION: Minor carotid atherosclerosis. No hemodynamically significant ICA stenosis. Degree of narrowing less than 50% bilaterally by ultrasound criteria. Patent antegrade vertebral flow bilaterally Electronically Signed   By: Jerilynn Mages.  Shick M.D.   On: 10/17/2019 10:38   DG Chest Portable 1 View  Result Date: 10/16/2019 CLINICAL DATA:  Shortness of breath EXAM: PORTABLE CHEST 1 VIEW COMPARISON:  None FINDINGS: Low lung volumes with streaky opacities in bases favoring atelectatic change as well as more bandlike opacities in the left mid lung which could reflect further subsegmental atelectasis or scarring. Central vascular crowding  is noted. Cardiac silhouette within expected normals for portable technique. The aorta is calcified. The remaining cardiomediastinal contours are unremarkable. No acute osseous or soft tissue abnormality. Degenerative changes are present in the imaged spine and shoulders. Telemetry leads overlie the chest. IMPRESSION: Low lung volumes with basilar atelectasis. Bandlike opacities in the left mid lung may reflect further atelectasis or scarring. Electronically Signed   By: Lovena Le M.D.   On: 10/16/2019 22:47   ECHOCARDIOGRAM COMPLETE  Result Date: 10/17/2019    ECHOCARDIOGRAM REPORT   Patient Name:   Alex Hampton Date of Exam: 10/17/2019 Medical Rec #:  [id number]     Height:       73.0 in Accession #:    [id number]    Weight:       245.0 lb Date of Birth:  [birth year]      BSA:          2.345 m Patient Age:    19 years      BP:           129/68 mmHg Patient Gender: M             HR:           58 bpm. Exam Location:  [facility] Procedure: 2D Echo, Color Doppler and Cardiac Doppler Indications:     Syncope 780.2  History:         Patient has no prior history of Echocardiogram examinations.                  Previous Myocardial Infarction; Risk Factors:Hypertension.  Sonographer:     Sherrie Sport RDCS (AE) Referring Phys:  [id number] Athena Masse Diagnosing Phys: Serafina Royals MD  Sonographer Comments: Technically challenging study due to limited acoustic windows, no apical window and no subcostal  window. Patient has fractured left shoulder and left arm is in a sling- kept supine. IMPRESSIONS  1. Left ventricular ejection fraction, by estimation, is 50 to 55%. The left ventricle has low normal function. The left ventricle has no regional wall motion abnormalities. Left ventricular diastolic function could not be evaluated.  2. Right ventricular systolic function is normal. The right ventricular size is normal.  3. The mitral valve was not well visualized. No evidence of mitral valve regurgitation.  4. The aortic valve is  normal in structure. Aortic valve regurgitation is not visualized. FINDINGS  Left Ventricle: Left ventricular ejection fraction, by estimation, is 50 to 55%. The left ventricle has low normal function. The left ventricle has no regional wall motion abnormalities. The left ventricular internal cavity size was normal in size. There is no left ventricular hypertrophy. Left ventricular diastolic function could not be evaluated. Right Ventricle: The right ventricular size is normal. No increase in right ventricular wall thickness. Right ventricular systolic function is normal. Left Atrium: Left atrial size was normal in size. Right Atrium: Right atrial size was normal in size. Pericardium: The pericardium was not assessed. Mitral Valve: The mitral valve was not well visualized. No evidence of mitral valve regurgitation. Tricuspid Valve: The tricuspid valve is not well visualized. Tricuspid valve regurgitation is not demonstrated. Aortic Valve: The aortic valve is normal in structure. Aortic valve regurgitation is not visualized. Pulmonic Valve: The pulmonic valve was not well visualized. Pulmonic valve regurgitation is not visualized. Aorta: The aortic root was not well visualized. IAS/Shunts: The interatrial septum was not assessed.  LEFT VENTRICLE PLAX 2D LVIDd:         4.71 cm LVIDs:         3.22 cm LV PW:         1.55 cm LV IVS:        1.61 cm LVOT diam:     2.20 cm LVOT Area:     3.80 cm  LEFT ATRIUM         Index LA diam:    4.20 cm 1.79 cm/m                        PULMONIC VALVE AORTA                 PV Vmax:        0.91 m/s Ao Root diam: 3.70 cm PV Peak grad:   3.3 mmHg                       RVOT Peak grad: 5 mmHg   SHUNTS Systemic Diam: 2.20 cm Serafina Royals MD Electronically signed by Serafina Royals MD Signature Date/Time: 10/17/2019/7:04:53 PM    Final     Medications:  I have reviewed the patient's current medications. Scheduled: . aspirin EC  81 mg Oral Daily  . buPROPion  150 mg Oral Daily  .  enoxaparin (LOVENOX) injection  40 mg Subcutaneous Q24H  . ezetimibe  10 mg Oral QHS   And  . simvastatin  80 mg Oral QHS  . FLUoxetine  10 mg Oral Daily  . montelukast  10 mg Oral Daily  . pantoprazole  40 mg Oral BID  . sodium chloride flush  3 mL Intravenous Q12H  . traZODone  300 mg Oral QHS  . venlafaxine XR  225 mg Oral Daily    Assessment/Plan: 64 y.o. male with a history of HTN, HLD and MI  who presented with complaints of shortness of breath.  In interview patient also reports multiple other complaints.  Reports that for the past 3 years has had difficulty with gait and frequent falls.  Fall have become increasingly more frequent.  Patient also reports dizziness, and hallucinations over the past 6 months.  Has noticed tremor in his legs bilaterally.  Does not feel there has been any cognitive decline.  Has difficulty having a bowel movement for the past 3 weeks.  No bowel or bladder incontinence.  Urine has been brown for some time.  On neurological examination patient appears to have a peripheral neuropathy which may have been affecting his gait.  Also has increased tone which may have contributed as well.  MRI of the brain personally reviewed and shows no acute changes but does show mineralization of the globus pallidus which more likely explains the gait abnormalities, increased tone and tremor.  May in fact be contributing to hallucinations as well.    B12, TSH, ESR and ammonia are normal.  B1 and RPR are pending.    Recommendations: 1. EEG pending 2. PT/OT    LOS: 1 day   Alexis Goodell, MD Neurology [phone] 10/18/2019  11:19 AM

## 2019-10-18 NOTE — Consult Note (Signed)
Alex Hampton , MD [address] [place], [place], [place], [address] [address], [place], [place], [place], [address] Phone: [phone]  Fax: [phone]  Consultation  Referring Provider:     No ref. provider found Primary Care Physician:  Juluis Pitch, MD Primary Gastroenterologist: None          Reason for Consultation:     Liver Cirrhosis   Date of Admission:  10/16/2019 Date of Consultation:  10/18/2019         HPI:   Alex Hampton is a 64 y.o. male admitted with shortness of breath . Difficulty with gait and falls  . Difficulty with bowel movements pas 3 weeks. Other medical issues are CAD,HTN,OSA.   He underwent a CT scan of the abdomen for distension , was found to have cirrhosis and mild splenomegaly, moderate volume ascites. Hb 13 grams with MCV 97, platelet count 166.INR 1.5 , T bil 2.9   Denies any excess alcohol consumption, tattoos, herbal medications, some members in the family hd hepatitis B, never known to have liver disease. He is an Midwife   Past Medical History:  Diagnosis Date  . Depression   . History of cardiac cath   . History of heart artery stent   . Hyperlipidemia   . Hypertension   . MI (myocardial infarction) ([place])     History reviewed. No pertinent surgical history.  Prior to Admission medications   Medication Sig Start Date End Date Taking? Authorizing Provider  amoxicillin (AMOXIL) 500 MG tablet Take 1,000 mg by mouth 2 (two) times daily. 10/16/19 10/26/19 Yes [provider]  aspirin 81 MG EC tablet Take by mouth.   Yes [provider]  buPROPion (WELLBUTRIN XL) 150 MG 24 hr tablet Take by mouth. 09/18/18  Yes [provider]  clarithromycin (BIAXIN) 500 MG tablet Take 500 mg by mouth 2 (two) times daily. 10/16/19 10/26/19 Yes [provider]  ezetimibe-simvastatin (VYTORIN) 10-80 MG tablet TAKE 1 TABLET BY MOUTH EVERY DAY AT NIGHT 01/20/19  Yes [provider]  FLUoxetine (PROZAC)  10 MG capsule Take by mouth. 09/18/18  Yes [provider]  fluticasone (FLONASE) 50 MCG/ACT nasal spray SPRAY 2 SPRAYS INTO EACH NOSTRIL EVERY DAY 09/30/18  Yes [provider]  hydrocortisone 2.5 % lotion hydrocortisone 2.5 % lotion  APPLY TO AFFECTED AREA UP TO 3 TIMES A WEEK MONDAY,WEDNESDAY, AND FRIDAY   Yes [provider]  lisinopril (ZESTRIL) 10 MG tablet Take 10 mg by mouth daily.  09/18/18  Yes [provider]  meloxicam (MOBIC) 15 MG tablet TAKE 1 TABLET BY MOUTH DAILY WITH MEALS 10/15/15  Yes [provider]  metoprolol tartrate (LOPRESSOR) 100 MG tablet Take 100 mg by mouth daily as needed.  09/18/18  Yes [provider]  montelukast (SINGULAIR) 10 MG tablet Take 1 tablet by mouth daily. 01/20/19  Yes [provider]  oxyCODONE-acetaminophen (PERCOCET) 5-325 MG tablet Take 1 tablet by mouth every 4 (four) hours as needed for severe pain. 10/11/19 10/10/20 Yes Sable Feil, PA-C  pantoprazole (PROTONIX) 40 MG tablet Take 40 mg by mouth 2 (two) times daily. 10/16/19 10/15/20 Yes [provider]  traZODone (DESYREL) 150 MG tablet Take 75 mg by mouth at bedtime. 09/15/19  Yes [provider]  venlafaxine XR (EFFEXOR-XR) 75 MG 24 hr capsule Take 225 mg by mouth daily.   Yes [provider]    History reviewed. No pertinent family history.   Social History  Tobacco Use  . Smoking status: Current Some Day Smoker  . Smokeless tobacco: Never Used  Substance Use Topics  . Alcohol use: Yes    Comment: occasionally  . Drug use: Never    Allergies as of 10/16/2019  . (No Known Allergies)    Review of Systems:    All systems reviewed and negative except where noted in HPI.   Physical Exam:  Vital signs in last 24 hours: Temp:  [97.8 F (36.6 C)-98.3 F (36.8 C)] 98.3 F (36.8 C) (07/10 0825) Pulse Rate:  [62-86] 86 (07/10 0825) Resp:  [15-19] 17 (07/10 0825) BP: (85-140)/(62-79) 97/62 (07/10  0825) SpO2:  [95 %-100 %] 97 % (07/10 0825) Weight:  [474 kg] 111 kg (07/10 0500) Last BM Date: 10/14/19 General:   Pleasant, cooperative in NAD Head:  Normocephalic and atraumatic. Eyes:   No icterus.   Conjunctiva pink. PERRLA. Ears:  Normal auditory acuity. Neck:  Supple; no masses or thyroidomegaly Lungs: Respirations even and unlabored. Lungs clear to auscultation bilaterally.   No wheezes, crackles, or rhonchi.  Heart:  Regular rate and rhythm;  Without murmur, clicks, rubs or gallops Abdomen:  Soft, mild distension, nontender. Normal bowel sounds. No appreciable masses or hepatomegaly.  No rebound or guarding.  Neurologic:  Alert and oriented x3;  Skin:  Intact without significant lesions or rashes. Cervical Nodes:  No significant cervical adenopathy. Psych:  Alert and cooperative. Normal affect.  LAB RESULTS: Recent Labs    10/16/19 2205 10/17/19 0858  WBC 9.5 8.8  HGB 14.6 13.0  HCT 40.7 36.2*  PLT 198 166   BMET Recent Labs    10/16/19 2205 10/17/19 0858  NA 133*  --   K 4.7  --   CL 100  --   CO2 24  --   GLUCOSE 118*  --   BUN 13  --   CREATININE 0.85 0.80  CALCIUM 8.4*  --    LFT Recent Labs    10/16/19 2205  PROT 6.2*  ALBUMIN 2.7*  AST 81*  ALT 37  ALKPHOS 177*  BILITOT 2.9*   PT/INR Recent Labs    10/17/19 0121  LABPROT 17.8*  INR 1.5*    STUDIES: CT ABDOMEN PELVIS WO CONTRAST  Result Date: 10/16/2019 CLINICAL DATA:  Abdominal distension. Patient had recent fall. EXAM: CT ABDOMEN AND PELVIS WITHOUT CONTRAST TECHNIQUE: Multidetector CT imaging of the abdomen and pelvis was performed following the standard protocol without IV contrast. COMPARISON:  Included portion from chest CTA earlier today. FINDINGS: Lower chest: Assessed on concurrent chest CT. Hepatobiliary: Nodular hepatic contours consistent with cirrhosis. No focal abnormality on noncontrast exam. Distended gallbladder without gallstones. No biliary dilatation. Perihepatic ascites  measures simple fluid density. Pancreas: No ductal dilatation. No discrete peripancreatic inflammation. Spleen: Mildly enlarged spanning 14.4 cm. Small cleft laterally. Perisplenic fluid measures simple fluid density. Adrenals/Urinary Tract: Low-density thickening of the right adrenal gland without dominant nodule. Left adrenal gland is normal. There is excreted IV contrast within both renal collecting systems from preceding chest CTA. No hydronephrosis. Excreted IV contrast within the urinary bladder. Bladder is minimally distended. Stomach/Bowel: Stomach is unremarkable. There is no small bowel obstruction or obvious inflammation. Moderate colonic stool burden. There is no colonic wall thickening or inflammatory change. Appendix tentatively visualized in the right pericolic gutter. Vascular/Lymphatic: Aortic atherosclerosis. No aortic aneurysm. There is no retroperitoneal fluid. Multiple small retroperitoneal lymph nodes are likely reactive. Reproductive: Prominent prostate gland spans 5.3 cm transverse. Other: Moderate volume abdominopelvic ascites.  Fluid measures simple fluid density. Mild mesenteric edema and small amount of mesenteric ascites. There is no free air. Small fat containing inguinal hernias. Musculoskeletal: No acute fracture of the lumbar spine, pelvis, or included ribs. Bilateral hip osteoarthritis. IMPRESSION: 1. Cirrhosis with mild splenomegaly and moderate volume abdominopelvic ascites. 2. Aortic Atherosclerosis (ICD10-I70.0). Electronically Signed   By: Keith Rake M.D.   On: 10/16/2019 23:32   CT Head Wo Contrast  Result Date: 10/16/2019 CLINICAL DATA:  Headache, acute, normal neuro exam EXAM: CT HEAD WITHOUT CONTRAST TECHNIQUE: Contiguous axial images were obtained from the base of the skull through the vertex without intravenous contrast. COMPARISON:  Head CT 5 days ago 10/11/2019 FINDINGS: Brain: No intracranial hemorrhage, mass effect, or midline shift. No hydrocephalus. The  basilar cisterns are patent. No evidence of territorial infarct or acute ischemia. No extra-axial or intracranial fluid collection. Vascular: No hyperdense vessel or unexpected calcification. Skull: No fracture or focal lesion. Sinuses/Orbits: Paranasal sinuses and mastoid air cells are clear. The visualized orbits are unremarkable. Other: None. IMPRESSION: Negative noncontrast head CT. Electronically Signed   By: Keith Rake M.D.   On: 10/16/2019 23:21   CT Angio Chest PE W and/or Wo Contrast  Result Date: 10/16/2019 CLINICAL DATA:  Shortness of breath EXAM: CT ANGIOGRAPHY CHEST WITH CONTRAST TECHNIQUE: Multidetector CT imaging of the chest was performed using the standard protocol during bolus administration of intravenous contrast. Multiplanar CT image reconstructions and MIPs were obtained to evaluate the vascular anatomy. CONTRAST:  37mL OMNIPAQUE IOHEXOL 350 MG/ML SOLN COMPARISON:  Radiograph 10/16/2019 FINDINGS: Cardiovascular: Satisfactory opacification the pulmonary arteries to the segmental level. No pulmonary artery filling defects are identified. Central pulmonary arteries are normal caliber. Cardiomegaly with four-chamber enlargement. No pericardial effusion. Extensive coronary artery calcifications are present. Minimal plaque in the normal caliber thoracic aorta. No acute luminal abnormality. No periaortic stranding or hemorrhage. Normal 3 vessel branching. Proximal great vessels are unremarkable aside from minimal plaque at the vertebral artery origins. Mediastinum/Nodes: No mediastinal fluid or gas. Normal thyroid gland and thoracic inlet. No acute abnormality of the trachea. Fluid-filled distal thoracic esophagus. No worrisome mediastinal or hilar adenopathy. There are extensive, asymmetric edematous changes centered upon the left shoulder and axillary soft tissues. No associated axillary adenopathy is evident. Lungs/Pleura: Low lung volumes with dependent atelectatic changes posteriorly.  More bandlike areas of opacity are present in the lung bases, likely reflecting further atelectasis or scarring subpleural fat noted in both lung bases, right slightly greater than left. No pneumothorax or visible effusion. No suspicious pulmonary nodules or masses. Upper Abdomen: Moderate volume upper abdominal ascites with a nodular, heterogeneous liver surface contour as well as caudate and left lobe hypertrophy. Upper abdomen is otherwise unremarkable. Musculoskeletal: Soft tissue stranding and edematous changes of the left shoulder and axillary region, as above, likely secondary to recent humeral fracture, outside the level of imaging. No acute osseous or musculotendinous injury is identified within the field of view. Review of the MIP images confirms the above findings. IMPRESSION: 1. No acute pulmonary artery filling defects to suggest pulmonary embolism. 2. Extensive, asymmetric edematous changes centered upon the left shoulder and axillary soft tissues, likely secondary to recent humeral fracture, outside the level of imaging. 3. Atelectatic changes other acute intrathoracic or traumatic findings in the chest. 4. Cardiomegaly with four-chamber enlargement. Extensive coronary artery calcifications. 5. Moderate volume upper abdominal ascites with a nodular, heterogeneous liver surface contour as well as caudate and left lobe hypertrophy. Findings are suggestive of cirrhosis and portal  hypertension with upper abdominal ascites, findings better detailed on dedicated abdominopelvic CT. 6. Aortic Atherosclerosis (ICD10-I70.0). Electronically Signed   By: Lovena Le M.D.   On: 10/16/2019 23:29   MR BRAIN WO CONTRAST  Result Date: 10/17/2019 CLINICAL DATA:  Shortness of breath. Acute neuro deficit with stroke suspected. Abnormal gait. EXAM: MRI HEAD WITHOUT CONTRAST TECHNIQUE: Multiplanar, multiecho pulse sequences of the brain and surrounding structures were obtained without intravenous contrast. COMPARISON:   Head CT from yesterday FINDINGS: Brain: No acute infarction, hemorrhage, hydrocephalus, extra-axial collection or mass lesion. Increased T1 signal at the basal ganglia correlating with cirrhosis by recent abdominal CT. Small FLAIR hyperintensities randomly scattered in the cerebral white matter, likely chronic small vessel ischemia-mild in extent. Brain volume is normal. Vascular: Normal flow voids accounting for arachnoid granulation at the right transverse sigmoid junction. Skull and upper cervical spine: Normal marrow signal Sinuses/Orbits: Negative IMPRESSION: 1. No acute or focal finding. 2. Globus pallidus mineralization correlating with cirrhosis. Electronically Signed   By: Monte Fantasia M.D.   On: 10/17/2019 07:06   US Carotid Bilateral  Result Date: 10/17/2019 CLINICAL DATA:  Syncope and collapse EXAM: BILATERAL CAROTID DUPLEX ULTRASOUND TECHNIQUE: Pearline Cables scale imaging, color Doppler and duplex ultrasound were performed of bilateral carotid and vertebral arteries in the neck. COMPARISON:  None. FINDINGS: Criteria: Quantification of carotid stenosis is based on velocity parameters that correlate the residual internal carotid diameter with NASCET-based stenosis levels, using the diameter of the distal internal carotid lumen as the denominator for stenosis measurement. The following velocity measurements were obtained: RIGHT ICA: 89/21 cm/sec CCA: 40/98 cm/sec SYSTOLIC ICA/CCA RATIO:  1.0 ECA: 134 cm/sec LEFT ICA: 80/17 cm/sec CCA: 119/14 cm/sec SYSTOLIC ICA/CCA RATIO:  0.8 ECA: 163 cm/sec RIGHT CAROTID ARTERY: Minor echogenic shadowing plaque formation. No hemodynamically significant right ICA stenosis, velocity elevation, or turbulent flow. Degree of narrowing less than 50%. RIGHT VERTEBRAL ARTERY:  Normal antegrade flow LEFT CAROTID ARTERY: Similar scattered minor echogenic plaque formation. No hemodynamically significant left ICA stenosis, velocity elevation, or turbulent flow. LEFT VERTEBRAL ARTERY:   Normal antegrade flow IMPRESSION: Minor carotid atherosclerosis. No hemodynamically significant ICA stenosis. Degree of narrowing less than 50% bilaterally by ultrasound criteria. Patent antegrade vertebral flow bilaterally Electronically Signed   By: Jerilynn Mages.  Shick M.D.   On: 10/17/2019 10:38   DG Chest Portable 1 View  Result Date: 10/16/2019 CLINICAL DATA:  Shortness of breath EXAM: PORTABLE CHEST 1 VIEW COMPARISON:  None FINDINGS: Low lung volumes with streaky opacities in bases favoring atelectatic change as well as more bandlike opacities in the left mid lung which could reflect further subsegmental atelectasis or scarring. Central vascular crowding is noted. Cardiac silhouette within expected normals for portable technique. The aorta is calcified. The remaining cardiomediastinal contours are unremarkable. No acute osseous or soft tissue abnormality. Degenerative changes are present in the imaged spine and shoulders. Telemetry leads overlie the chest. IMPRESSION: Low lung volumes with basilar atelectasis. Bandlike opacities in the left mid lung may reflect further atelectasis or scarring. Electronically Signed   By: Lovena Le M.D.   On: 10/16/2019 22:47   ECHOCARDIOGRAM COMPLETE  Result Date: 10/17/2019    ECHOCARDIOGRAM REPORT   Patient Name:   AHMON TOSI Date of Exam: 10/17/2019 Medical Rec #:  [id number]     Height:       73.0 in Accession #:    [id number]    Weight:       245.0 lb Date of Birth:  Aug 25, 1955  BSA:          2.345 m Patient Age:    30 years      BP:           129/68 mmHg Patient Gender: M             HR:           58 bpm. Exam Location:  [facility] Procedure: 2D Echo, Color Doppler and Cardiac Doppler Indications:     Syncope 780.2  History:         Patient has no prior history of Echocardiogram examinations.                  Previous Myocardial Infarction; Risk Factors:Hypertension.  Sonographer:     Sherrie Sport RDCS (AE) Referring Phys:  [id number] Athena Masse Diagnosing Phys: Serafina Royals MD  Sonographer Comments: Technically challenging study due to limited acoustic windows, no apical window and no subcostal window. Patient has fractured left shoulder and left arm is in a sling- kept supine. IMPRESSIONS  1. Left ventricular ejection fraction, by estimation, is 50 to 55%. The left ventricle has low normal function. The left ventricle has no regional wall motion abnormalities. Left ventricular diastolic function could not be evaluated.  2. Right ventricular systolic function is normal. The right ventricular size is normal.  3. The mitral valve was not well visualized. No evidence of mitral valve regurgitation.  4. The aortic valve is normal in structure. Aortic valve regurgitation is not visualized. FINDINGS  Left Ventricle: Left ventricular ejection fraction, by estimation, is 50 to 55%. The left ventricle has low normal function. The left ventricle has no regional wall motion abnormalities. The left ventricular internal cavity size was normal in size. There is no left ventricular hypertrophy. Left ventricular diastolic function could not be evaluated. Right Ventricle: The right ventricular size is normal. No increase in right ventricular wall thickness. Right ventricular systolic function is normal. Left Atrium: Left atrial size was normal in size. Right Atrium: Right atrial size was normal in size. Pericardium: The pericardium was not assessed. Mitral Valve: The mitral valve was not well visualized. No evidence of mitral valve regurgitation. Tricuspid Valve: The tricuspid valve is not well visualized. Tricuspid valve regurgitation is not demonstrated. Aortic Valve: The aortic valve is normal in structure. Aortic valve regurgitation is not visualized. Pulmonic Valve: The pulmonic valve was not well visualized. Pulmonic valve regurgitation is not visualized. Aorta: The aortic root was not well visualized. IAS/Shunts: The interatrial septum was not assessed.  LEFT VENTRICLE PLAX 2D LVIDd:          4.71 cm LVIDs:         3.22 cm LV PW:         1.55 cm LV IVS:        1.61 cm LVOT diam:     2.20 cm LVOT Area:     3.80 cm  LEFT ATRIUM         Index LA diam:    4.20 cm 1.79 cm/m                        PULMONIC VALVE AORTA                 PV Vmax:        0.91 m/s Ao Root diam: 3.70 cm PV Peak grad:   3.3 mmHg  RVOT Peak grad: 5 mmHg   SHUNTS Systemic Diam: 2.20 cm Serafina Royals MD Electronically signed by Serafina Royals MD Signature Date/Time: 10/17/2019/7:04:53 PM    Final       Impression / Plan:   KEYLER HOGE is a 64 y.o. y/o male whom I have been consulted for incidental findings of cirrhosis and ascites on abdominal imaging. Also some neurological symptoms of gait instability and dizziness which is not typical symptoms of hepatic encephelopathy.Clinically he has no evidence of hepatic encephalopathy. His gait disturbances he states have been progressively getting worse over the past 3 years but more so recently.    Plan  1. Diagnostic and therapeutic abdominal paracentesis to rule out SBP 2. Follow up hepatitis panel, as an outpatient can have work up for autoimmune hepatitis. Follow up ceruloplasmin levels to evaluate for wilson's due to gait instability 3. Low sodium diet < 2 grams per day - avoid normal saline infusion  4. No NSAID's /Meloxicam  5. EGD as an outpatient to screen for  Varices 6.  Do not check serial ammonia levels as it does not correlate with mental state changes over a period of time . If patient is confused, has a liver flap and no other cause found to the same then likely due to hepatic encephalopathy irrespective of ammonia level.    Thank you for involving me in the care of this patient.      LOS: 1 day   Alex Bellows, MD  10/18/2019, 10:16 AM

## 2019-10-19 DIAGNOSIS — S42425K Nondisplaced comminuted supracondylar fracture without intercondylar fracture of left humerus, subsequent encounter for fracture with nonunion: Secondary | ICD-10-CM

## 2019-10-19 LAB — IRON AND TIBC
Iron: 90 ug/dL (ref 45–182)
Saturation Ratios: 45 % — ABNORMAL HIGH (ref 17.9–39.5)
TIBC: 202 ug/dL — ABNORMAL LOW (ref 250–450)
UIBC: 112 ug/dL

## 2019-10-19 LAB — HEPATITIS B SURFACE ANTIBODY, QUANTITATIVE: Hep B S AB Quant (Post): <3.1 m[IU]/mL — ABNORMAL LOW (ref >9.9)

## 2019-10-19 LAB — GLUCOSE, CAPILLARY: Glucose-Capillary: 89 mg/dL (ref 70–99)

## 2019-10-19 LAB — FERRITIN: Ferritin: 742 ng/mL — ABNORMAL HIGH (ref 24–336)

## 2019-10-19 NOTE — Progress Notes (Signed)
Progress Note    Alex Hampton  ZOX:[id number] DOB: [birth year]  DOA: 10/16/2019 PCP: Alex Pitch, MD      Brief Narrative:    Medical records reviewed and are as summarized below:  Alex Hampton is a 64 y.o. male       Assessment/Plan:   Active Problems:   Gait abnormality   History of fall within past 90 days   Hallucinations   Cirrhosis of liver with ascites ([place]) on CT   HTN (hypertension)   History of MI (myocardial infarction)   OSA (obstructive sleep apnea)   Nondisplaced comminuted supracondylar fracture without intercondylar fracture of left humerus, subsequent encounter for fracture with nonunion   Anxiety and depression   Confusion and disorientation   Mineralization of the globus pallidus, unsteady gait, hallucinations: EEG is pending.  Plan discussed with Dr. Doy Hampton, neurologist.  She thinks unsteady gait, hallucinations and increased muscle tone may be due to mineralization of the globus pallidus which could be coming from liver cirrhosis.  RPR, TSH and CK level were normal.  PT and OT evaluation.  Liver cirrhosis with ascites:  Acute hepatitis panel and hepatitis surface antibody tests were negative.  Limited abdominal ultrasound showed only small volume ascites and therefore paracentesis could not be performed.  Lisinopril has been discontinued.  Recent fall leading to displaced comminuted left humeral fracture on 10/11/2019: Analgesics as needed for pain.  He said he was scheduled to see Dr. Sabra Heck, orthopedic surgeon as an outpatient on Tuesday 10/21/2019.  Consider orthopedic surgeon consult tomorrow.  CAD with history of angioplasty: Continue aspirin, metoprolol and Vytorin.  Anxiety and depression: Continue psychotropics  OSA: Nonadherent with CPAP.  Body mass index is 30.34 kg/m.  Diet Order            Diet Heart Room service appropriate? Yes; Fluid consistency: Thin  Diet effective now                       Medications:    . aspirin EC  81 mg Oral Daily  . buPROPion  150 mg Oral Daily  . enoxaparin (LOVENOX) injection  40 mg Subcutaneous Q24H  . ezetimibe  10 mg Oral QHS   And  . simvastatin  80 mg Oral QHS  . FLUoxetine  10 mg Oral Daily  . montelukast  10 mg Oral Daily  . pantoprazole  40 mg Oral BID  . polyethylene glycol  17 g Oral QHS  . sodium chloride flush  3 mL Intravenous Q12H  . traZODone  300 mg Oral QHS  . venlafaxine XR  225 mg Oral Daily   Continuous Infusions:   Anti-infectives (From admission, onward)   None             Family Communication/Anticipated D/C date and plan/Code Status   DVT prophylaxis: enoxaparin (LOVENOX) injection 40 mg Start: 10/17/19 1000     Code Status: Full Code  Family Communication: Plan discussed with patient Disposition Plan:    Status is: Inpatient  Remains inpatient appropriate because:Inpatient level of care appropriate due to severity of illness   Dispo: The patient is from: Home              Anticipated d/c is to: SNF              Anticipated d/c date is: 2 days              Patient currently is not medically stable  to d/c.           Subjective:   C/o pain in the left shoulder.  He has not got up with PT yet  Objective:    Vitals:   10/19/19 0448 10/19/19 0500 10/19/19 0820 10/19/19 1114  BP: 124/73  111/66 (!) 147/95  Pulse: 89  87 92  Resp: 17  16 16   Temp: 98.9 F (37.2 C)  98.9 F (37.2 C) 98.6 F (37 C)  TempSrc:   Oral Oral  SpO2: 95%  96% 97%  Weight:  104.3 kg    Height:       No data found.   Intake/Output Summary (Last 24 hours) at 10/19/2019 1443 Last data filed at 10/18/2019 2100 Gross per 24 hour  Intake -  Output 1 ml  Net -1 ml   Filed Weights   10/16/19 2150 10/18/19 0500 10/19/19 0500  Weight: 111.1 kg 111 kg 104.3 kg    Exam:  GEN: NAD SKIN: No rash EYES: No pallor or icterus ENT: MMM CV: RRR PULM: No wheezing or rales ABD: soft, ND, NT, +BS CNS: AAO x 3, non focal.   Patient is forgetful, and it appears he has  short-term memory impairment. EXT: Left shoulder is swollen and tender.  There is ecchymosis on the left shoulder and left arm.  No lower extremity swelling or tenderness. PSYCH: Calm and cooperative    Data Reviewed:   I have personally reviewed following labs and imaging studies:  Labs: Labs show the following:   Basic Metabolic Panel: Recent Labs  Lab 10/16/19 2205 10/17/19 0858  NA 133*  --   K 4.7  --   CL 100  --   CO2 24  --   GLUCOSE 118*  --   BUN 13  --   CREATININE 0.85 0.80  CALCIUM 8.4*  --    GFR Estimated Creatinine Clearance: 116.8 mL/min (by C-G formula based on SCr of 0.8 mg/dL). Liver Function Tests: Recent Labs  Lab 10/16/19 2205  AST 81*  ALT 37  ALKPHOS 177*  BILITOT 2.9*  PROT 6.2*  ALBUMIN 2.7*   No results for input(s): LIPASE, AMYLASE in the last 168 hours. Recent Labs  Lab 10/17/19 0217  AMMONIA 30   Coagulation profile Recent Labs  Lab 10/17/19 0121  INR 1.5*    CBC: Recent Labs  Lab 10/16/19 2205 10/17/19 0858  WBC 9.5 8.8  NEUTROABS 6.7  --   HGB 14.6 13.0  HCT 40.7 36.2*  MCV 96.7 97.8  PLT 198 166   Cardiac Enzymes: Recent Labs  Lab 10/18/19 0759  CKTOTAL 114   BNP (last 3 results) No results for input(s): PROBNP in the last 8760 hours. CBG: Recent Labs  Lab 10/18/19 0454 10/19/19 0450  GLUCAP 100* 89   D-Dimer: No results for input(s): DDIMER in the last 72 hours. Hgb A1c: No results for input(s): HGBA1C in the last 72 hours. Lipid Profile: No results for input(s): CHOL, HDL, LDLCALC, TRIG, CHOLHDL, LDLDIRECT in the last 72 hours. Thyroid function studies: Recent Labs    10/17/19 1638  TSH 2.133   Anemia work up: Recent Labs    10/17/19 0121 10/19/19 1309  VITAMINB12 973*  --   FERRITIN  --  742*  TIBC  --  202*  IRON  --  90   Sepsis Labs: Recent Labs  Lab 10/16/19 2205 10/17/19 0858  WBC 9.5 8.8    Microbiology Recent Results  (from the past 240 hour(s))  SARS Coronavirus 2 by RT PCR (hospital order, performed in [facility] hospital lab) Nasopharyngeal Nasopharyngeal Swab     Status: None   Collection Time: 10/17/19  9:22 AM   Specimen: Nasopharyngeal Swab  Result Value Ref Range Status   SARS Coronavirus 2 NEGATIVE NEGATIVE Final    Comment: (NOTE) SARS-CoV-2 target nucleic acids are NOT DETECTED.  The SARS-CoV-2 RNA is generally detectable in upper and lower respiratory specimens during the acute phase of infection. The lowest concentration of SARS-CoV-2 viral copies this assay can detect is 250 copies / mL. A negative result does not preclude SARS-CoV-2 infection and should not be used as the sole basis for treatment or other patient management decisions.  A negative result may occur with improper specimen collection / handling, submission of specimen other than nasopharyngeal swab, presence of viral mutation(s) within the areas targeted by this assay, and inadequate number of viral copies (<250 copies / mL). A negative result must be combined with clinical observations, patient history, and epidemiological information.  Fact Sheet for Patients:   StrictlyIdeas.no  Fact Sheet for Healthcare Providers: BankingDealers.co.za  This test is not yet approved or  cleared by the [place] FDA and has been authorized for detection and/or diagnosis of SARS-CoV-2 by FDA under an Emergency Use Authorization (EUA).  This EUA will remain in effect (meaning this test can be used) for the duration of the COVID-19 declaration under Section 564(b)(1) of the Act, 21 U.S.C. section 360bbb-3(b)(1), unless the authorization is terminated or revoked sooner.  Performed at [facility], [address] [place], [place] [address]     Procedures and diagnostic studies:  US Abdomen Limited  Result Date: 10/18/2019 CLINICAL DATA:  Abdominal distension.  Cirrhosis with ascites described on recent CT EXAM: LIMITED ABDOMEN ULTRASOUND FOR ASCITES TECHNIQUE: Limited ultrasound survey for ascites was performed in all four abdominal quadrants. COMPARISON:  CT 10/16/2019 FINDINGS: Only a small amount of perihepatic and right lower quadrant ascites was identified. There is a moderate amount of bowel gas. No safe pocket for diagnostic or therapeutic paracentesis. IMPRESSION: Small volume ascites.  Paracentesis deferred. Electronically Signed   By: Lucrezia Europe M.D.   On: 10/18/2019 15:29               LOS: 2 days   Anayi Bricco  [facility]     10/19/2019, 2:43 PM

## 2019-10-19 NOTE — Progress Notes (Signed)
Alex Hampton , MD [address], [place], [place], [place], [address] [address], [place], [place], [place], [address] Phone: [phone]  Fax: [phone]   Alex Hampton is being followed for cirrhosis of liver  Day 1 of follow up   Subjective: No new complaints    Objective: Vital signs in last 24 hours: Vitals:   10/18/19 2320 10/19/19 0448 10/19/19 0500 10/19/19 0820  BP: 114/65 124/73  111/66  Pulse: 93 89  87  Resp: 16 17  16   Temp: 98.1 F (36.7 C) 98.9 F (37.2 C)  98.9 F (37.2 C)  TempSrc:    Oral  SpO2: 92% 95%  96%  Weight:   104.3 kg   Height:       Weight change: -6.673 kg  Intake/Output Summary (Last 24 hours) at 10/19/2019 0912 Last data filed at 10/18/2019 2100 Gross per 24 hour  Intake --  Output 1 ml  Net -1 ml     Exam: Ax0 x 3 not in pain or distress, not encephelopathic  Lab Results: @LABTEST2 @ Micro Results: Recent Results (from the past 240 hour(s))  SARS Coronavirus 2 by RT PCR (hospital order, performed in [place] hospital lab) Nasopharyngeal Nasopharyngeal Swab     Status: None   Collection Time: 10/17/19  9:22 AM   Specimen: Nasopharyngeal Swab  Result Value Ref Range Status   SARS Coronavirus 2 NEGATIVE NEGATIVE Final    Comment: (NOTE) SARS-CoV-2 target nucleic acids are NOT DETECTED.  The SARS-CoV-2 RNA is generally detectable in upper and lower respiratory specimens during the acute phase of infection. The lowest concentration of SARS-CoV-2 viral copies this assay can detect is 250 copies / mL. A negative result does not preclude SARS-CoV-2 infection and should not be used as the sole basis for treatment or other patient management decisions.  A negative result may occur with improper specimen collection / handling, submission of specimen other than nasopharyngeal swab, presence of viral mutation(s) within the areas targeted by this assay, and inadequate number of viral copies (<250 copies / mL). A negative  result must be combined with clinical observations, patient history, and epidemiological information.  Fact Sheet for Patients:   StrictlyIdeas.no  Fact Sheet for Healthcare Providers: BankingDealers.co.za  This test is not yet approved or  cleared by the [place] FDA and has been authorized for detection and/or diagnosis of SARS-CoV-2 by FDA under an Emergency Use Authorization (EUA).  This EUA will remain in effect (meaning this test can be used) for the duration of the COVID-19 declaration under Section 564(b)(1) of the Act, 21 U.S.C. section 360bbb-3(b)(1), unless the authorization is terminated or revoked sooner.  Performed at [facility], [address] [place], [place] [address]    Studies/Results: US Carotid Bilateral  Result Date: 10/17/2019 CLINICAL DATA:  Syncope and collapse EXAM: BILATERAL CAROTID DUPLEX ULTRASOUND TECHNIQUE: Pearline Cables scale imaging, color Doppler and duplex ultrasound were performed of bilateral carotid and vertebral arteries in the neck. COMPARISON:  None. FINDINGS: Criteria: Quantification of carotid stenosis is based on velocity parameters that correlate the residual internal carotid diameter with NASCET-based stenosis levels, using the diameter of the distal internal carotid lumen as the denominator for stenosis measurement. The following velocity measurements were obtained: RIGHT ICA: 89/21 cm/sec CCA: 74/08 cm/sec SYSTOLIC ICA/CCA RATIO:  1.0 ECA: 134 cm/sec LEFT ICA: 80/17 cm/sec CCA: 144/81 cm/sec SYSTOLIC ICA/CCA RATIO:  0.8 ECA: 163 cm/sec RIGHT CAROTID ARTERY: Minor echogenic shadowing plaque formation. No hemodynamically significant right  ICA stenosis, velocity elevation, or turbulent flow. Degree of narrowing less than 50%. RIGHT VERTEBRAL ARTERY:  Normal antegrade flow LEFT CAROTID ARTERY: Similar scattered minor echogenic plaque formation. No hemodynamically significant left ICA stenosis,  velocity elevation, or turbulent flow. LEFT VERTEBRAL ARTERY:  Normal antegrade flow IMPRESSION: Minor carotid atherosclerosis. No hemodynamically significant ICA stenosis. Degree of narrowing less than 50% bilaterally by ultrasound criteria. Patent antegrade vertebral flow bilaterally Electronically Signed   By: Jerilynn Mages.  Shick M.D.   On: 10/17/2019 10:38   US Abdomen Limited  Result Date: 10/18/2019 CLINICAL DATA:  Abdominal distension. Cirrhosis with ascites described on recent CT EXAM: LIMITED ABDOMEN ULTRASOUND FOR ASCITES TECHNIQUE: Limited ultrasound survey for ascites was performed in all four abdominal quadrants. COMPARISON:  CT 10/16/2019 FINDINGS: Only a small amount of perihepatic and right lower quadrant ascites was identified. There is a moderate amount of bowel gas. No safe pocket for diagnostic or therapeutic paracentesis. IMPRESSION: Small volume ascites.  Paracentesis deferred. Electronically Signed   By: Lucrezia Europe M.D.   On: 10/18/2019 15:29   ECHOCARDIOGRAM COMPLETE  Result Date: 10/17/2019    ECHOCARDIOGRAM REPORT   Patient Name:   Alex Hampton Date of Exam: 10/17/2019 Medical Rec #:  [id number]     Height:       73.0 in Accession #:    [id number]    Weight:       245.0 lb Date of Birth:  Apr 05, 1956      BSA:          2.345 m Patient Age:    64 years      BP:           129/68 mmHg Patient Gender: M             HR:           58 bpm. Exam Location:  [facility] Procedure: 2D Echo, Color Doppler and Cardiac Doppler Indications:     Syncope 780.2  History:         Patient has no prior history of Echocardiogram examinations.                  Previous Myocardial Infarction; Risk Factors:Hypertension.  Sonographer:     Sherrie Sport RDCS (AE) Referring Phys:  [id number] Athena Masse Diagnosing Phys: Serafina Royals MD  Sonographer Comments: Technically challenging study due to limited acoustic windows, no apical window and no subcostal window. Patient has fractured left shoulder and left arm is in a sling- kept  supine. IMPRESSIONS  1. Left ventricular ejection fraction, by estimation, is 50 to 55%. The left ventricle has low normal function. The left ventricle has no regional wall motion abnormalities. Left ventricular diastolic function could not be evaluated.  2. Right ventricular systolic function is normal. The right ventricular size is normal.  3. The mitral valve was not well visualized. No evidence of mitral valve regurgitation.  4. The aortic valve is normal in structure. Aortic valve regurgitation is not visualized. FINDINGS  Left Ventricle: Left ventricular ejection fraction, by estimation, is 50 to 55%. The left ventricle has low normal function. The left ventricle has no regional wall motion abnormalities. The left ventricular internal cavity size was normal in size. There is no left ventricular hypertrophy. Left ventricular diastolic function could not be evaluated. Right Ventricle: The right ventricular size is normal. No increase in right ventricular wall thickness. Right ventricular systolic function is normal. Left Atrium: Left atrial size was normal in size. Right Atrium: Right  atrial size was normal in size. Pericardium: The pericardium was not assessed. Mitral Valve: The mitral valve was not well visualized. No evidence of mitral valve regurgitation. Tricuspid Valve: The tricuspid valve is not well visualized. Tricuspid valve regurgitation is not demonstrated. Aortic Valve: The aortic valve is normal in structure. Aortic valve regurgitation is not visualized. Pulmonic Valve: The pulmonic valve was not well visualized. Pulmonic valve regurgitation is not visualized. Aorta: The aortic root was not well visualized. IAS/Shunts: The interatrial septum was not assessed.  LEFT VENTRICLE PLAX 2D LVIDd:         4.71 cm LVIDs:         3.22 cm LV PW:         1.55 cm LV IVS:        1.61 cm LVOT diam:     2.20 cm LVOT Area:     3.80 cm  LEFT ATRIUM         Index LA diam:    4.20 cm 1.79 cm/m                         PULMONIC VALVE AORTA                 PV Vmax:        0.91 m/s Ao Root diam: 3.70 cm PV Peak grad:   3.3 mmHg                       RVOT Peak grad: 5 mmHg   SHUNTS Systemic Diam: 2.20 cm Serafina Royals MD Electronically signed by Serafina Royals MD Signature Date/Time: 10/17/2019/7:04:53 PM    Final    Medications: I have reviewed the patient's current medications. Scheduled Meds: . aspirin EC  81 mg Oral Daily  . buPROPion  150 mg Oral Daily  . enoxaparin (LOVENOX) injection  40 mg Subcutaneous Q24H  . ezetimibe  10 mg Oral QHS   And  . simvastatin  80 mg Oral QHS  . FLUoxetine  10 mg Oral Daily  . montelukast  10 mg Oral Daily  . pantoprazole  40 mg Oral BID  . polyethylene glycol  17 g Oral QHS  . sodium chloride flush  3 mL Intravenous Q12H  . traZODone  300 mg Oral QHS  . venlafaxine XR  225 mg Oral Daily   Continuous Infusions: PRN Meds:.fluticasone, morphine injection, nitroGLYCERIN, ondansetron **OR** ondansetron (ZOFRAN) IV, oxyCODONE-acetaminophen, senna-docusate   Assessment: Active Problems:   Gait abnormality   History of fall within past 90 days   Hallucinations   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   HTN (hypertension)   History of MI (myocardial infarction)   OSA (obstructive sleep apnea)   Nondisplaced comminuted supracondylar fracture without intercondylar fracture of left humerus, subsequent encounter for fracture with nonunion   Anxiety and depression   Confusion and disorientation  Alex Hampton is a 64 y.o. y/o male whom I have been consulted for incidental findings of cirrhosis and ascites on abdominal imaging. Also some neurological symptoms of gait instability and dizziness which is not typical symptoms of hepatic encephelopathy.Clinically he has no evidence of hepatic encephalopathy. His gait disturbances he states have been progressively getting worse over the past 3 years but more so recently. Minimal ascites on ultrasound and hence paracentesis not  possible    Plan  1. EGD as an outpatient to screen for  Varices 2. Follow up hepatitis panel, will  order autoimmune panel  Follow up ceruloplasmin levels to evaluate for wilson's due to gait instability 3. Low sodium diet < 2 grams per day - avoid normal saline infusion  4. No NSAID's /Meloxicam  5. OP GI follow up with me in 10-14 days video or tele visit    I will sign off.  Please call me if any further GI concerns or questions.  We would like to thank you for the opportunity to participate in the care of Alex Hampton.    LOS: 2 days   Alex Bellows, MD 10/19/2019, 9:12 AM

## 2019-10-20 LAB — ANTI-SMOOTH MUSCLE ANTIBODY, IGG: F-Actin IgG: 17 Units (ref 0–19)

## 2019-10-20 LAB — CERULOPLASMIN: Ceruloplasmin: 27.1 mg/dL (ref 16.0–31.0)

## 2019-10-20 LAB — ANTI-MICROSOMAL ANTIBODY LIVER / KIDNEY: LKM1 Ab: 0.9 Units (ref 0.0–20.0)

## 2019-10-20 LAB — VITAMIN B1: Vitamin B1 (Thiamine): 119.4 nmol/L (ref 66.5–200.0)

## 2019-10-20 LAB — ANA W/REFLEX IF POSITIVE: Anti Nuclear Antibody (ANA): NEGATIVE

## 2019-10-20 LAB — GLUCOSE, CAPILLARY: Glucose-Capillary: 86 mg/dL (ref 70–99)

## 2019-10-20 MED ORDER — OXYCODONE-ACETAMINOPHEN 5-325 MG PO TABS
1.0000 | ORAL_TABLET | ORAL | Status: DC | PRN
  Administered 2019-10-20 (×2): 1 via ORAL
  Filled 2019-10-20 (×2): qty 1

## 2019-10-20 MED ORDER — ACETAMINOPHEN 325 MG PO TABS: 650.0000 mg | ORAL_TABLET | Freq: Four times a day (QID) | ORAL | Status: DC | PRN

## 2019-10-20 MED ORDER — POLYETHYLENE GLYCOL 3350 17 G PO PACK
17.0000 g | PACK | Freq: Two times a day (BID) | ORAL | Status: DC | PRN
  Administered 2019-10-20 – 2019-10-21 (×3): 17 g via ORAL
  Filled 2019-10-20 (×3): qty 1

## 2019-10-20 NOTE — Progress Notes (Signed)
eeg completed ° °

## 2019-10-20 NOTE — TOC Initial Note (Signed)
Transition of Care [facility]) - Initial/Assessment Note    Patient Details  Name: Alex Hampton MRN: [id number] Date of Birth: 1956-01-31  [facility] CM/SW Contact:    Shelbie Hutching, RN Phone Number: 10/20/2019, 2:58 PM  Clinical Narrative:                 Patient admitted with gait abnormality.  RNCM introduced self and explained role.  Patient is from home where he lives in a 3rd floor apartment by himself.  Patient's 2 children, Jabier Mutton and [name] are at the bedside.  Patient has been struggling recently with frequent falls and unsteadiness.  PT is recommending SNF and patient agrees.  Patient prefers Radiation protection practitioner for [facility].  Bed search started.    Expected Discharge Plan: Skilled Nursing Facility Barriers to Discharge: Continued Medical Work up   Patient Goals and CMS Choice Patient states their goals for this hospitalization and ongoing recovery are:: To get stronger and be able to eventually go back home.  Pt agrees with SNF CMS Medicare.gov Compare Post Acute Care list provided to:: Patient Choice offered to / list presented to : Patient  Expected Discharge Plan and Services Expected Discharge Plan: [facility]   Discharge Planning Services: CM Consult Post Acute Care Choice: [place] Living arrangements for the past 2 months: Apartment                                      Prior Living Arrangements/Services Living arrangements for the past 2 months: Apartment Lives with:: Self Patient language and need for interpreter reviewed:: Yes Do you feel safe going back to the place where you live?: Yes      Need for Family Participation in Patient Care: Yes (Comment) (frequent falls) Care giver support system in place?: Yes (comment) (children)   Criminal Activity/Legal Involvement Pertinent to Current Situation/Hospitalization: No - Comment as needed  Activities of Daily Living Home Assistive Devices/Equipment: Sling (specify  type) (Left arm sling) ADL Screening (condition at time of admission) Patient's cognitive ability adequate to safely complete daily activities?: Yes Is the patient deaf or have difficulty hearing?: Yes Does the patient have difficulty seeing, even when wearing glasses/contacts?: No Does the patient have difficulty concentrating, remembering, or making decisions?: No Patient able to express need for assistance with ADLs?: Yes Does the patient have difficulty dressing or bathing?: No Independently performs ADLs?: Yes (appropriate for developmental age) Does the patient have difficulty walking or climbing stairs?: No Weakness of Legs: None Weakness of Arms/Hands: Left  Permission Sought/Granted Permission sought to share information with : Case Manager, Customer service manager, Family Supports Permission granted to share information with : Yes, Verbal Permission Granted  Share Information with NAME: Jabier Mutton and [name]  Permission granted to share info w AGENCY: Dietitian granted to share info w Relationship: son and daughter     Emotional Assessment Appearance:: Appears stated age Attitude/Demeanor/Rapport: Engaged Affect (typically observed): Accepting Orientation: : Oriented to Self, Oriented to Place, Oriented to  Time, Oriented to Situation Alcohol / Substance Use: Not Applicable Psych Involvement: No (comment)  Admission diagnosis:  Hallucinations [R44.3] Syncope and collapse [R55] Fall [W19.XXXA] Cirrhosis of liver with ascites, unspecified hepatic cirrhosis type ([place]) [K74.60, R18.8] Confusion and disorientation [R41.0] Chest pain, unspecified type [R07.9] Patient Active Problem List   Diagnosis Date Noted  . Gait abnormality 10/17/2019  . History of  fall within past 90 days 10/17/2019  . Hallucinations 10/17/2019  . Cirrhosis of liver with ascites ([place]) on CT 10/17/2019  . HTN (hypertension) 10/17/2019  . History of MI (myocardial infarction)  10/17/2019  . OSA (obstructive sleep apnea) 10/17/2019  . Nondisplaced comminuted supracondylar fracture without intercondylar fracture of left humerus, subsequent encounter for fracture with nonunion 10/17/2019  . Anxiety and depression 10/17/2019  . Confusion and disorientation 10/17/2019   PCP:  Juluis Pitch, MD Pharmacy:   [id number] [address], [place] [address] Phone: [phone] Fax: [phone]     Social Determinants of Health (SDOH) Interventions    Readmission Risk Interventions No flowsheet data found.

## 2019-10-20 NOTE — Evaluation (Signed)
Occupational Therapy Evaluation Patient Details Name: Alex Hampton MRN: [id number] DOB: Aug 02, 1955 Today's Date: 10/20/2019    History of Present Illness Pt admitted for gait abnormality with complaints of SOB/Chest pain/hallucinations. Of note, previous fall on 10/11/19 with L humerus fx currently in sling at this time. History includes MI, HTN, depression, and anxiety.   Clinical Impression   Pt was seen for OT evaluation this date. Prior to hospital admission, pt was Indep with ADLs and fxl mobility, but endorses several recent falls that has been occurring progressively over ~91yrs with most recent fall resulting in current L humerus injury. Pt lives alone in [place] apartment with no elevator access. Currently pt demonstrates impairments as described below (See OT problem list) which functionally limit his ability to perform ADL/self-care tasks. Pt currently requires MIN A with fxl mobility and transfers with HHA as well as MOD to MAX A with UB/LB dressing 2/2 limited L UE mobility.  Pt would benefit from skilled OT to address noted impairments and functional limitations (see below for any additional details) in order to maximize safety and independence while minimizing falls risk and caregiver burden. Upon hospital discharge, recommend STR to maximize pt safety and return to PLOF.     Follow Up Recommendations  SNF;Supervision - Intermittent    Equipment Recommendations  3 in 1 bedside commode;Tub/shower seat;Other (comment) [facility])    Recommendations for Other Services       Precautions / Restrictions Precautions Precautions: Fall Required Braces or Orthoses: Sling (Per Rudene Christians, requesting different sling as outpatient) Restrictions Weight Bearing Restrictions: Yes LUE Weight Bearing: Non weight bearing      Mobility Bed Mobility Overal bed mobility: Needs Assistance Bed Mobility: Sit to Supine     Supine to sit: Min assist Sit to supine: Min assist   General bed mobility  comments: MIN A to manage trunk/L UE back to bed, pt able to manage LEs.  Transfers Overall transfer level: Needs assistance Equipment used: 1 person hand held assist Transfers: Sit to/from Stand Sit to Stand: Min assist           Balance Overall balance assessment: Needs assistance;History of Falls Sitting-balance support: Feet supported Sitting balance-Leahy Scale: Good Sitting balance - Comments: able to maintain static balance sitting with R UE support   Standing balance support: Single extremity supported Standing balance-Leahy Scale: Poor Standing balance comment: Pt with poor static standing balance and with fxl mobility.                     ADL either performed or assessed with clinical judgement   ADL Overall ADL's : Needs assistance/impaired     Grooming: Wash/dry hands;Wash/dry face;Oral care;Sitting;Standing;Minimal assistance Grooming Details (indicate cue type and reason): sink-side, pt starts hygiene tasks in standing with MIN A for standing balance and setup of ADL items. Pt then reports dizziness and OT facilitates MIN A with HHA for stand to sit to [facility] in restroom to complete remainder of hygiene tasks in sitting. Pt requires MIN A with seated tasks as well d/t limited mobility of L UE Upper Body Bathing: Minimal assistance;Sitting Upper Body Bathing Details (indicate cue type and reason): ed re: see-saw method.     Upper Body Dressing : Moderate assistance;Sitting   Lower Body Dressing: Moderate assistance;Maximal assistance;Sit to/from stand Lower Body Dressing Details (indicate cue type and reason): based on clinical obs r/t L UE immobilization Toilet Transfer: Minimal assistance;Ambulation;Grab bars Toilet Transfer Details (indicate cue type and reason): HHA to perform  fxl mobility  from restroom (pt seated on commode having gotten there with family assistance before OT entered room for eval Toileting- Clothing Manipulation and Hygiene: Minimal  assistance;Sit to/from stand       Functional mobility during ADLs: Minimal assistance (hand held assist on R side)       Vision Baseline Vision/History: Wears glasses Wears Glasses: At all times Patient Visual Report: No change from baseline       Perception     Praxis      Pertinent Vitals/Pain Pain Assessment: 0-10 Pain Score: 4  Pain Location: L shoulder Pain Descriptors / Indicators: Discomfort;Dull Pain Intervention(s): Limited activity within patient's tolerance;Repositioned     Hand Dominance     Extremity/Trunk Assessment Upper Extremity Assessment Upper Extremity Assessment: RUE deficits/detail;LUE deficits/detail RUE Deficits / Details: ROM WFL, MMT grossly 4+/5 LUE Deficits / Details: grip 4+/5 LUE: Unable to fully assess due to pain;Unable to fully assess due to immobilization   Lower Extremity Assessment Lower Extremity Assessment: Defer to PT evaluation;Overall WFL for tasks assessed       Communication Communication Communication: No difficulties   Cognition Arousal/Alertness: Awake/alert Behavior During Therapy: WFL for tasks assessed/performed Overall Cognitive Status: Within Functional Limits for tasks assessed                                 General Comments: reports some difficulty focusing.   General Comments       Exercises Exercises: Other exercises Other Exercises Other Exercises: OT facilitates education with pt re: role of OT in acute setting as well as mgt of sling, sleep considerations, UB dressing/bathing modified technique. Pt with moderate reception but endorses difficulty focusing of late. OT also educates pt's children who are present in the room t/o.   Shoulder Instructions      Home Living Family/patient expects to be discharged to:: Private residence Living Arrangements: Alone Available Help at Discharge: Family;Available PRN/intermittently Type of Home: Apartment Home Access: Stairs to enter Entrance  Stairs-Number of Steps: 3 flights of stairs without elevator access Entrance Stairs-Rails: Can reach both Home Layout: One level               Home Equipment: None          Prior Functioning/Environment Level of Independence: Independent        Comments: recently retired earlier this year. Previously indep. Reports for past 3 years has noticed difficulty with balance with falls every day.        OT Problem List: Decreased strength;Decreased range of motion;Decreased activity tolerance;Impaired balance (sitting and/or standing);Decreased coordination;Decreased knowledge of use of DME or AE;Decreased knowledge of precautions;Impaired UE functional use;Pain      OT Treatment/Interventions: Self-care/ADL training;Therapeutic exercise;DME and/or AE instruction;Therapeutic activities;Balance training;Patient/family education    OT Goals(Current goals can be found in the care plan section) Acute Rehab OT Goals Patient Stated Goal: to get better OT Goal Formulation: With patient/family Time For Goal Achievement: 11/03/19 Potential to Achieve Goals: Good  OT Frequency: Min 2X/week   Barriers to D/C: Decreased caregiver support          Co-evaluation              AM-PAC OT "6 Clicks" Daily Activity     Outcome Measure Help from another person eating meals?: A Little Help from another person taking care of personal grooming?: A Little Help from another person toileting, which includes using toliet, bedpan,  or urinal?: A Little Help from another person bathing (including washing, rinsing, drying)?: A Lot Help from another person to put on and taking off regular upper body clothing?: A Little Help from another person to put on and taking off regular lower body clothing?: A Lot 6 Click Score: 16   End of Session Equipment Utilized During Treatment: Gait belt Nurse Communication: Mobility status  Activity Tolerance: Patient tolerated treatment well Patient left: in  bed;with call bell/phone within reach;with bed alarm set;with family/visitor present  OT Visit Diagnosis: Unsteadiness on feet (R26.81);Repeated falls (R29.6);Muscle weakness (generalized) (M62.81);History of falling (Z91.81);Pain Pain - Right/Left: Left Pain - part of body: Shoulder;Arm                Time: 6269-4854 OT Time Calculation (min): 33 min Charges:  OT General Charges $OT Visit: 1 Visit OT Evaluation $OT Eval Moderate Complexity: 1 Mod OT Treatments $Self Care/Home Management : 8-22 mins  Gerrianne Scale, MS, OTR/L ascom [phone] 10/20/19, 5:12 PM

## 2019-10-20 NOTE — Evaluation (Signed)
Physical Therapy Evaluation Patient Details Name: Alex Hampton MRN: [id number] DOB: 1956/03/20 Today's Date: 10/20/2019   History of Present Illness  Pt admitted for gait abnormality with complaints of SOB/Chest pain/hallucinations. Of note, previous fall on 10/11/19 with L humerus fx currently in sling at this time. History includes MI, HTN, depression, and anxiety.  Clinical Impression  Pt is a pleasant 64 year old male who was admitted for gait abnormality with complaints of multiple falls. Pt performs bed mobility with min assist, transfers with min assist, and ambulation with mod assist and SPC. Pt very unsteady during ambulation scoring 5/12 on modified DGI. Pt demonstrates deficits with balance/pain/mobility. Pt also demonstrates increased B LE tone with knee extension, however reports all sensation as normal. Ataxic movements noted. Slight resting tremor in B LEs. Pt is very high falls risk and unsafe to ambulate without hands on assist. Due to L shoulder fx, needs assist from [facility]. Educated on falls prevention. Would benefit from skilled PT to address above deficits and promote optimal return to PLOF; recommend transition to STR upon discharge from acute hospitalization.     Follow Up Recommendations SNF    Equipment Recommendations   (TBD at next venue of care)    Recommendations for Other Services       Precautions / Restrictions Precautions Precautions: Fall Required Braces or Orthoses: Sling (per menz, requesting different sling as OP) Restrictions Weight Bearing Restrictions: Yes LUE Weight Bearing: Non weight bearing      Mobility  Bed Mobility Overal bed mobility: Needs Assistance Bed Mobility: Supine to Sit     Supine to sit: Min assist     General bed mobility comments: needs assist for cues and compensatory strategies. Upon returning to bed, able to swing B LEs into bed with using R hand on bed rail  Transfers Overall transfer level: Needs  assistance Equipment used: Straight cane Transfers: Sit to/from Stand Sit to Stand: Min assist         General transfer comment: needs assist for preparing for standing. Once standing, wide BOS noted with static standing. Gave [facility] for assist  Ambulation/Gait Ambulation/Gait assistance: Mod assist Gait Distance (Feet): 150 Feet Assistive device: Straight cane Gait Pattern/deviations: Decreased step length - right;Decreased step length - left;Ataxic;Wide base of support     General Gait Details: ambulated in hallway with close chair follow. Multiple LOB noted during ambulation with any distraction ie cart in hallway. Also severe LOB with head turns, needing mod assist from therapist to prevent fall. Ataxic gait and heavy use on SPC, ideally would benefit from RW, however not appropriate at this time. Needs cues for safety  Stairs            Wheelchair Mobility    Modified Rankin (Stroke Patients Only)       Balance Overall balance assessment: Needs assistance;History of Falls Sitting-balance support: Feet supported Sitting balance-Leahy Scale: Good Sitting balance - Comments: able to maintain static balance   Standing balance support: Single extremity supported Standing balance-Leahy Scale: Poor Standing balance comment: loses balance easily with static standing without hands on assist                 Standardized Balance Assessment Standardized Balance Assessment : Dynamic Gait Index   Dynamic Gait Index Level Surface: Mild Impairment Change in Gait Speed: Moderate Impairment Gait with Horizontal Head Turns: Moderate Impairment Gait with Vertical Head Turns: Moderate Impairment       Pertinent Vitals/Pain Pain Assessment: 0-10 Pain Score:  4  Pain Location: L shoulder Pain Descriptors / Indicators: Discomfort;Dull Pain Intervention(s): Limited activity within patient's tolerance;Repositioned    Home Living Family/patient expects to be discharged to::  Private residence Living Arrangements: Alone Available Help at Discharge: Family;Available PRN/intermittently Type of Home: Apartment Home Access: Stairs to enter Entrance Stairs-Rails: Can reach both Entrance Stairs-Number of Steps: 3 flights of stairs without elevator access Home Layout: One level Home Equipment: None      Prior Function Level of Independence: Independent         Comments: recently retired earlier this year. Previously indep. Reports for past 3 years has noticed difficulty with balance with falls every day.     Hand Dominance        Extremity/Trunk Assessment   Upper Extremity Assessment Upper Extremity Assessment: Overall WFL for tasks assessed (L UE not tested, symmetrical grip strength)    Lower Extremity Assessment Lower Extremity Assessment: Overall WFL for tasks assessed (noted increased B LE tone and slight tremors)       Communication   Communication: No difficulties  Cognition Arousal/Alertness: Awake/alert Behavior During Therapy: WFL for tasks assessed/performed Overall Cognitive Status: Within Functional Limits for tasks assessed                                        General Comments      Exercises Other Exercises Other Exercises: discussed hand/wrist exercises with L shoulder in sling including wrist circles, grip squeezes, and wrist flex/ext. 10 rep Other Exercises: ambulated in hallway with education/cues for use of SPC. Adjusted to correct height and educated on various AD that may assist for balance   Assessment/Plan    PT Assessment Patient needs continued PT services  PT Problem List Decreased balance;Decreased mobility;Decreased knowledge of use of DME;Decreased safety awareness;Impaired tone;Pain       PT Treatment Interventions DME instruction;Gait training;Therapeutic exercise;Balance training    PT Goals (Current goals can be found in the Care Plan section)  Acute Rehab PT Goals Patient Stated  Goal: to get better PT Goal Formulation: With patient Time For Goal Achievement: 11/03/19 Potential to Achieve Goals: Good    Frequency Min 2X/week   Barriers to discharge        Co-evaluation               AM-PAC PT "6 Clicks" Mobility  Outcome Measure Help needed turning from your back to your side while in a flat bed without using bedrails?: A Little Help needed moving from lying on your back to sitting on the side of a flat bed without using bedrails?: A Little Help needed moving to and from a bed to a chair (including a wheelchair)?: A Lot Help needed standing up from a chair using your arms (e.g., wheelchair or bedside chair)?: A Lot Help needed to walk in hospital room?: A Lot Help needed climbing 3-5 steps with a railing? : Total 6 Click Score: 13    End of Session Equipment Utilized During Treatment: Gait belt;Other (comment) (L shoulder sling) Activity Tolerance: Patient tolerated treatment well Patient left: in bed;with bed alarm set Nurse Communication: Mobility status;Precautions;Weight bearing status PT Visit Diagnosis: Unsteadiness on feet (R26.81);Repeated falls (R29.6);Other abnormalities of gait and mobility (R26.89);History of falling (Z91.81);Ataxic gait (R26.0);Difficulty in walking, not elsewhere classified (R26.2);Pain Pain - Right/Left: Left Pain - part of body: Shoulder    Time: 1316-1350 PT Time Calculation (min) (ACUTE  ONLY): 34 min   Charges:   PT Evaluation $PT Eval Moderate Complexity: 1 Mod PT Treatments $Therapeutic Exercise: 8-22 mins        Greggory Stallion, PT, DPT [phone]   Ludmila Ebarb 10/20/2019, 3:26 PM

## 2019-10-20 NOTE — Consult Note (Signed)
Reason for Consult: Left humerus fracture Referring Physician: Dr. [name] is an 64 y.o. male.  HPI: Patient is a 64 year old currently admitted for symptoms of shortness of breath.  He suffered a fall on 7 5 and was seen in the emergency room and had a spiral humeral shaft fracture placed in immobilizer at that time.  Not consulted as he has not had follow-up orthopedic follow-up since his ER visit.  He reports that he suffered a fall twisting the left arm and having immediate pain.  Past Medical History:  Diagnosis Date  . Depression   . History of cardiac cath   . History of heart artery stent   . Hyperlipidemia   . Hypertension   . MI (myocardial infarction) ([place])     History reviewed. No pertinent surgical history.  History reviewed. No pertinent family history.  Social History:  reports that he has been smoking. He has never used smokeless tobacco. He reports current alcohol use. He reports that he does not use drugs.  Allergies: No Known Allergies  Medications: I have reviewed the patient's current medications.  Results for orders placed or performed during the hospital encounter of 10/16/19 (from the past 48 hour(s))  Glucose, capillary     Status: None   Collection Time: 10/19/19  4:50 AM  Result Value Ref Range   Glucose-Capillary 89 70 - 99 mg/dL    Comment: Glucose reference range applies only to samples taken after fasting for at least 8 hours.  Iron and TIBC     Status: Abnormal   Collection Time: 10/19/19  1:09 PM  Result Value Ref Range   Iron 90 45 - 182 ug/dL   TIBC 202 (L) 250 - 450 ug/dL   Saturation Ratios 45 (H) 17.9 - 39.5 %   UIBC 112 ug/dL    Comment: Performed at [facility], [address] [place], [place] [address]  Ferritin     Status: Abnormal   Collection Time: 10/19/19  1:09 PM  Result Value Ref Range   Ferritin 742 (H) 24 - 336 ng/mL    Comment: Performed at [facility], [address] [place],   [address]  Glucose, capillary     Status: None   Collection Time: 10/20/19  5:48 AM  Result Value Ref Range   Glucose-Capillary 86 70 - 99 mg/dL    Comment: Glucose reference range applies only to samples taken after fasting for at least 8 hours.    US Abdomen Limited  Result Date: 10/18/2019 CLINICAL DATA:  Abdominal distension. Cirrhosis with ascites described on recent CT EXAM: LIMITED ABDOMEN ULTRASOUND FOR ASCITES TECHNIQUE: Limited ultrasound survey for ascites was performed in all four abdominal quadrants. COMPARISON:  CT 10/16/2019 FINDINGS: Only a small amount of perihepatic and right lower quadrant ascites was identified. There is a moderate amount of bowel gas. No safe pocket for diagnostic or therapeutic paracentesis. IMPRESSION: Small volume ascites.  Paracentesis deferred. Electronically Signed   By: Lucrezia Europe M.D.   On: 10/18/2019 15:29    Review of Systems Blood pressure 123/63, pulse 85, temperature 97.8 F (36.6 C), temperature source Oral, resp. rate 18, height 6\' 1"  (1.854 m), weight 98.9 kg, SpO2 97 %. Physical Exam there is extensive ecchymosis of the left arm extending down to the hand.  There is marked swelling as well but no evidence of compartment syndrome.  His skin is intact.  Sensation intact and able to fully extend the thumb as well  as flex extend the fingers. Radiographic review reveals spiral slightly comminuted midshaft fracture left humerus  Assessment/Plan: Left spiral humeral shaft fracture with intact radial nerve. Recommendation is for application of a fracture brace as an outpatient and nonoperative treatment.  Alex Hampton 10/20/2019, 8:13 AM

## 2019-10-20 NOTE — NC FL2 (Signed)
[place] LEVEL OF CARE SCREENING TOOL     IDENTIFICATION  Patient Name: Alex Hampton Birthdate: March 05, 1956 Sex: male Admission Date (Current Location): 10/16/2019  [facility] and [place] Number:  Engineering geologist and Address:  [facility], [address], [place], [place] [address]      Provider Number: [id number]  Attending Physician Name and Address:  Jennye Boroughs, MD  Relative Name and Phone Number:  Rishaan Gunner son- [phone]    Current Level of Care: Hospital Recommended Level of Care: [place] Prior Approval Number:    Date Approved/Denied:   PASRR Number: pending  Discharge Plan: SNF    Current Diagnoses: Patient Active Problem List   Diagnosis Date Noted  . Gait abnormality 10/17/2019  . History of fall within past 90 days 10/17/2019  . Hallucinations 10/17/2019  . 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 HTN (hypertension) 10/17/2019  . History of MI (myocardial infarction) 10/17/2019  . OSA (obstructive sleep apnea) 10/17/2019  . Nondisplaced comminuted supracondylar fracture without intercondylar fracture of left humerus, subsequent encounter for fracture with nonunion 10/17/2019  . Anxiety and depression 10/17/2019  . Confusion and disorientation 10/17/2019    Orientation RESPIRATION BLADDER Height & Weight     Self, Time, Situation, Place  Normal Continent Weight: 98.9 kg Height:  6\' 1"  (185.4 cm)  BEHAVIORAL SYMPTOMS/MOOD NEUROLOGICAL BOWEL NUTRITION STATUS      Continent Diet (see discharge summary)  AMBULATORY STATUS COMMUNICATION OF NEEDS Skin   Limited Assist Verbally Bruising (left arm and shoulder)                       Personal Care Assistance Level of Assistance  Bathing, Feeding, Dressing Bathing Assistance: Limited assistance Feeding assistance: Limited assistance Dressing Assistance: Limited assistance     Functional Limitations Info              SPECIAL CARE FACTORS FREQUENCY  PT (By licensed PT), OT (By licensed OT)     PT Frequency: 5 times per week OT Frequency: 5 times per week            Contractures Contractures Info: Not present    Additional Factors Info  Code Status, Allergies Code Status Info: Full Allergies Info: NKA           Current Medications (10/20/2019):  This is the current hospital active medication list Current Facility-Administered Medications  Medication Dose Route Frequency Provider Last Rate Last Admin  . acetaminophen (TYLENOL) tablet 650 mg  650 mg Oral Q6H PRN Jennye Boroughs, MD      . aspirin EC tablet 81 mg  81 mg Oral Daily Jennye Boroughs, MD   81 mg at 10/20/19 0905  . buPROPion (WELLBUTRIN XL) 24 hr tablet 150 mg  150 mg Oral Daily Jennye Boroughs, MD   150 mg at 10/20/19 0857  . enoxaparin (LOVENOX) injection 40 mg  40 mg Subcutaneous Q24H Athena Masse, MD   40 mg at 10/20/19 0857  . ezetimibe (ZETIA) tablet 10 mg  10 mg Oral QHS Jennye Boroughs, MD   10 mg at 10/19/19 2055   And  . simvastatin (ZOCOR) tablet 80 mg  80 mg Oral Rondel Baton, MD   80 mg at 10/19/19 2054  . FLUoxetine (PROZAC) capsule 10 mg  10 mg Oral Daily Jennye Boroughs, MD   10 mg at 10/20/19 0857  . fluticasone (FLONASE) 50 MCG/ACT nasal spray  2 spray  2 spray Each Nare Daily PRN Jennye Boroughs, MD      . montelukast (SINGULAIR) tablet 10 mg  10 mg Oral Daily Jennye Boroughs, MD   10 mg at 10/20/19 0858  . nitroGLYCERIN (NITROSTAT) SL tablet 0.4 mg  0.4 mg Sublingual Q5 min PRN Athena Masse, MD      . ondansetron [facility]) tablet 4 mg  4 mg Oral Q6H PRN Athena Masse, MD       Or  . ondansetron [facility]) injection 4 mg  4 mg Intravenous Q6H PRN Athena Masse, MD   4 mg at 10/17/19 0549  . oxyCODONE-acetaminophen (PERCOCET/ROXICET) 5-325 MG per tablet 1 tablet  1 tablet Oral Q4H PRN Jennye Boroughs, MD   1 tablet at 10/20/19 0922  . pantoprazole (PROTONIX) EC tablet 40 mg  40 mg Oral BID Jennye Boroughs,  MD   40 mg at 10/20/19 0858  . polyethylene glycol (MIRALAX / GLYCOLAX) packet 17 g  17 g Oral BID PRN Jennye Boroughs, MD   17 g at 10/20/19 1437  . senna-docusate (Senokot-S) tablet 1 tablet  1 tablet Oral QHS PRN Jennye Boroughs, MD      . sodium chloride flush (NS) 0.9 % injection 3 mL  3 mL Intravenous Q12H Athena Masse, MD   3 mL at 10/20/19 0922  . traZODone (DESYREL) tablet 300 mg  300 mg Oral QHS Jennye Boroughs, MD   300 mg at 10/19/19 2053  . venlafaxine XR (EFFEXOR-XR) 24 hr capsule 225 mg  225 mg Oral Daily Jennye Boroughs, MD   225 mg at 10/20/19 5300     Discharge Medications: Please see discharge summary for a list of discharge medications.  Relevant Imaging Results:  Relevant Lab Results:   Additional Information SS# [id number]  Shelbie Hutching, RN

## 2019-10-20 NOTE — Progress Notes (Signed)
Progress Note    Alex Hampton  FTD:[id number] DOB: [birth year]  DOA: 10/16/2019 PCP: Juluis Pitch, MD      Brief Narrative:    Medical records reviewed and are as summarized below:  Alex Hampton is a 64 y.o. male       Assessment/Plan:   Active Problems:   Gait abnormality   History of fall within past 90 days   Hallucinations   Cirrhosis of liver with ascites ([place]) on CT   HTN (hypertension)   History of MI (myocardial infarction)   OSA (obstructive sleep apnea)   Nondisplaced comminuted supracondylar fracture without intercondylar fracture of left humerus, subsequent encounter for fracture with nonunion   Anxiety and depression   Confusion and disorientation   Mineralization of the globus pallidus, unsteady gait, hallucinations: EEG is pending.  Plan discussed with Dr. Doy Mince, neurologist.  She thinks unsteady gait, hallucinations and increased muscle tone may be due to mineralization of the globus pallidus which could be coming from liver cirrhosis.  RPR, TSH and CK level were normal.  Awaiting PT and OT evaluation.  Liver cirrhosis with ascites:  Acute hepatitis panel and hepatitis surface antibody tests were negative.  Limited abdominal ultrasound showed only small volume ascites and therefore paracentesis could not be performed.  Lisinopril has been discontinued.  Gastroenterologist has signed off and recommended outpatient follow-up for EGD.  Recent fall leading to displaced comminuted left humeral fracture on 10/11/2019: Analgesics as needed for pain.  Consulted Dr. Rudene Christians, orthopedic surgeon, who recommended application of fracture brace as an outpatient and nonoperative treatment.  CAD with history of angioplasty: Continue aspirin, metoprolol and Vytorin.  Anxiety and depression: Continue psychotropics  OSA: Nonadherent with CPAP.  Body mass index is 28.76 kg/m.  Diet Order            Diet Heart Room service appropriate? Yes; Fluid consistency: Thin   Diet effective now                       Medications:   . aspirin EC  81 mg Oral Daily  . buPROPion  150 mg Oral Daily  . enoxaparin (LOVENOX) injection  40 mg Subcutaneous Q24H  . ezetimibe  10 mg Oral QHS   And  . simvastatin  80 mg Oral QHS  . FLUoxetine  10 mg Oral Daily  . montelukast  10 mg Oral Daily  . pantoprazole  40 mg Oral BID  . polyethylene glycol  17 g Oral QHS  . sodium chloride flush  3 mL Intravenous Q12H  . traZODone  300 mg Oral QHS  . venlafaxine XR  225 mg Oral Daily   Continuous Infusions:   Anti-infectives (From admission, onward)   None             Family Communication/Anticipated D/C date and plan/Code Status   DVT prophylaxis: enoxaparin (LOVENOX) injection 40 mg Start: 10/17/19 1000     Code Status: Full Code  Family Communication: Plan discussed with patient Disposition Plan:    Status is: Inpatient  Remains inpatient appropriate because:Inpatient level of care appropriate due to severity of illness   Dispo: The patient is from: Home              Anticipated d/c is to: SNF              Anticipated d/c date is: 2 days  Patient currently is not medically stable to d/c.           Subjective:   He still has pain in the left shoulder.  No vomiting, abdominal pain or diarrhea.  No other complaints.  Objective:    Vitals:   10/20/19 0036 10/20/19 0434 10/20/19 0500 10/20/19 0743  BP: 116/76 (!) 144/73  123/63  Pulse: 80 74  85  Resp: 16 16  18   Temp: 97.8 F (36.6 C) 97.6 F (36.4 C)  97.8 F (36.6 C)  TempSrc: Oral Oral  Oral  SpO2: 93% 97%  97%  Weight:   98.9 kg   Height:       No data found.   Intake/Output Summary (Last 24 hours) at 10/20/2019 1116 Last data filed at 10/20/2019 1012 Gross per 24 hour  Intake 120 ml  Output --  Net 120 ml   Filed Weights   10/18/19 0500 10/19/19 0500 10/20/19 0500  Weight: 111 kg 104.3 kg 98.9 kg    Exam:  GEN: No acute distress SKIN:  Warm and dry EYES: No pallor or icterus ENT: MMM CV: RRR PULM: Clear to auscultation bilaterally ABD: soft, ND, NT, +BS CNS: AAO x 3, non focal.  Patient is forgetful, and it appears he has  short-term memory impairment. EXT: Left shoulder is swollen and tender.  There is ecchymosis on the left shoulder and left arm.  No lower extremity swelling or tenderness. PSYCH: Calm and cooperative    Data Reviewed:   I have personally reviewed following labs and imaging studies:  Labs: Labs show the following:   Basic Metabolic Panel: Recent Labs  Lab 10/16/19 2205 10/17/19 0858  NA 133*  --   K 4.7  --   CL 100  --   CO2 24  --   GLUCOSE 118*  --   BUN 13  --   CREATININE 0.85 0.80  CALCIUM 8.4*  --    GFR Estimated Creatinine Clearance: 113.9 mL/min (by C-G formula based on SCr of 0.8 mg/dL). Liver Function Tests: Recent Labs  Lab 10/16/19 2205  AST 81*  ALT 37  ALKPHOS 177*  BILITOT 2.9*  PROT 6.2*  ALBUMIN 2.7*   No results for input(s): LIPASE, AMYLASE in the last 168 hours. Recent Labs  Lab 10/17/19 0217  AMMONIA 30   Coagulation profile Recent Labs  Lab 10/17/19 0121  INR 1.5*    CBC: Recent Labs  Lab 10/16/19 2205 10/17/19 0858  WBC 9.5 8.8  NEUTROABS 6.7  --   HGB 14.6 13.0  HCT 40.7 36.2*  MCV 96.7 97.8  PLT 198 166   Cardiac Enzymes: Recent Labs  Lab 10/18/19 0759  CKTOTAL 114   BNP (last 3 results) No results for input(s): PROBNP in the last 8760 hours. CBG: Recent Labs  Lab 10/18/19 0454 10/19/19 0450 10/20/19 0548  GLUCAP 100* 89 86   D-Dimer: No results for input(s): DDIMER in the last 72 hours. Hgb A1c: No results for input(s): HGBA1C in the last 72 hours. Lipid Profile: No results for input(s): CHOL, HDL, LDLCALC, TRIG, CHOLHDL, LDLDIRECT in the last 72 hours. Thyroid function studies: Recent Labs    10/17/19 1638  TSH 2.133   Anemia work up: Recent Labs    10/19/19 1309  FERRITIN 742*  TIBC 202*  IRON 90     Sepsis Labs: Recent Labs  Lab 10/16/19 2205 10/17/19 0858  WBC 9.5 8.8    Microbiology Recent Results (from the past 240 hour(s))  SARS Coronavirus 2 by RT PCR (hospital order, performed in [facility] hospital lab) Nasopharyngeal Nasopharyngeal Swab     Status: None   Collection Time: 10/17/19  9:22 AM   Specimen: Nasopharyngeal Swab  Result Value Ref Range Status   SARS Coronavirus 2 NEGATIVE NEGATIVE Final    Comment: (NOTE) SARS-CoV-2 target nucleic acids are NOT DETECTED.  The SARS-CoV-2 RNA is generally detectable in upper and lower respiratory specimens during the acute phase of infection. The lowest concentration of SARS-CoV-2 viral copies this assay can detect is 250 copies / mL. A negative result does not preclude SARS-CoV-2 infection and should not be used as the sole basis for treatment or other patient management decisions.  A negative result may occur with improper specimen collection / handling, submission of specimen other than nasopharyngeal swab, presence of viral mutation(s) within the areas targeted by this assay, and inadequate number of viral copies (<250 copies / mL). A negative result must be combined with clinical observations, patient history, and epidemiological information.  Fact Sheet for Patients:   StrictlyIdeas.no  Fact Sheet for Healthcare Providers: BankingDealers.co.za  This test is not yet approved or  cleared by the [place] FDA and has been authorized for detection and/or diagnosis of SARS-CoV-2 by FDA under an Emergency Use Authorization (EUA).  This EUA will remain in effect (meaning this test can be used) for the duration of the COVID-19 declaration under Section 564(b)(1) of the Act, 21 U.S.C. section 360bbb-3(b)(1), unless the authorization is terminated or revoked sooner.  Performed at [facility], [address], [place], [place] [address]     Procedures and  diagnostic studies:  US Abdomen Limited  Result Date: 10/18/2019 CLINICAL DATA:  Abdominal distension. Cirrhosis with ascites described on recent CT EXAM: LIMITED ABDOMEN ULTRASOUND FOR ASCITES TECHNIQUE: Limited ultrasound survey for ascites was performed in all four abdominal quadrants. COMPARISON:  CT 10/16/2019 FINDINGS: Only a small amount of perihepatic and right lower quadrant ascites was identified. There is a moderate amount of bowel gas. No safe pocket for diagnostic or therapeutic paracentesis. IMPRESSION: Small volume ascites.  Paracentesis deferred. Electronically Signed   By: Lucrezia Europe M.D.   On: 10/18/2019 15:29               LOS: 3 days   Kayan Blissett  [facility]     10/20/2019, 11:16 AM

## 2019-10-20 NOTE — Procedures (Signed)
ELECTROENCEPHALOGRAM REPORT   Patient: Alex Hampton       Room #: 109A-AA EEG No. ID: [id number] Age: 64 y.o.        Sex: male Requesting Physician: Mal Misty Report Date:  10/20/2019        Interpreting Physician: Alexis Goodell  History: [name] is an 64 y.o. male with hallucinations  Medications:  ASA, Wellbutrin, Zetia, Prozac, Zocor, Desyrel, Effexor  Conditions of Recording:  This is a 21 channel routine scalp EEG performed with bipolar and monopolar montages arranged in accordance to the international 10/20 system of electrode placement. One channel was dedicated to EKG recording.  The patient is in the awake and drowsy states.  Description:  The waking background activity consists of a low voltage, symmetrical, fairly well organized, 8 Hz alpha activity, seen from the parieto-occipital and posterior temporal regions.  Low voltage fast activity, poorly organized, is seen anteriorly and is at times superimposed on more posterior regions.  A mixture of theta and alpha rhythms are seen from the central and temporal regions. The patient drowses briefly with slowing to irregular, low voltage theta and beta activity.   Stage II sleep is not obtained. No epileptiform activity is noted.   Hyperventilation was not performed.  Intermittent photic stimulation was performed but failed to illicit any change in the tracing.    IMPRESSION: Normal electroencephalogram, awake, drowsy and with activation procedures. There are no focal lateralizing or epileptiform features.   Alexis Goodell, MD Neurology [phone] 10/20/2019, 4:20 PM

## 2019-10-21 DIAGNOSIS — R443 Hallucinations, unspecified: Secondary | ICD-10-CM

## 2019-10-21 LAB — PROTEIN ELECTROPHORESIS, SERUM
A/G Ratio: 1 (ref 0.7–1.7)
Albumin ELP: 2.8 g/dL — ABNORMAL LOW (ref 2.9–4.4)
Alpha-1-Globulin: 0.1 g/dL (ref 0.0–0.4)
Alpha-2-Globulin: 0.5 g/dL (ref 0.4–1.0)
Beta Globulin: 1 g/dL (ref 0.7–1.3)
Gamma Globulin: 1.2 g/dL (ref 0.4–1.8)
Globulin, Total: 2.8 g/dL (ref 2.2–3.9)
Total Protein ELP: 5.6 g/dL — ABNORMAL LOW (ref 6.0–8.5)

## 2019-10-21 LAB — GLUCOSE, CAPILLARY: Glucose-Capillary: 89 mg/dL (ref 70–99)

## 2019-10-21 NOTE — TOC Progression Note (Signed)
Transition of Care [facility]) - Progression Note    Patient Details  Name: Alex Hampton MRN: [id number] Date of Birth: [birth year]  Transition of Care [facility]) CM/SW Contact  Shelbie Hutching, RN Phone Number: 10/21/2019, 2:54 PM  Clinical Narrative:    Alex Hampton declined to offer a bed but [facility] has offered.  Family and patient would like to accept the bed offer at [facility].  Patient has had both his Covid Vaccines, copy sent to [facility].  Pasrr is still pending level 2.    Expected Discharge Plan: [place] Barriers to Discharge: Continued Medical Work up  Expected Discharge Plan and Services Expected Discharge Plan: [place]   Discharge Planning Services: CM Consult Post Acute Care Choice: [place] Living arrangements for the past 2 months: Apartment                                       Social Determinants of Health (SDOH) Interventions    Readmission Risk Interventions No flowsheet data found.

## 2019-10-21 NOTE — Progress Notes (Signed)
Progress Note    AYO Hampton  XLK:[id number] DOB: 1956/03/18  DOA: 10/16/2019 PCP: Alex Pitch, MD      Brief Narrative:    Medical records reviewed and are as summarized below:  Alex Hampton is a 64 y.o. male with medical history significant for history of MI, HTN, OSA not currently on CPAP, depression and anxiety who presents to the emergency room with a main complaint of shortness of breath that awoke him from sleep associated with diaphoresis.  He denies cough fever or chills and denies lower extremity pain or swelling.  Patient was seen in the emergency room on 10/11/2019 for a fall in which he sustained a fracture of the left humerus.   He complained of unsteady gait and hallucinations.  He said unsteady gait has caused falls in the past.  He said he has had issues with unsteady gait for almost 3 years.  MRI brain did not show any acute abnormality.  However, there was evidence of mineralization of the globus pallidus.  He was evaluated by the neurologist.  Neurologist suspects that mineralization of the globus pallidus may be causing the constellation of symptoms (hallucinations, memory issues, unsteady gait).  It is also suspected that medialization of globus pallidus may be due to liver cirrhosis.  He was evaluated by the gastroenterologist because of liver cirrhosis.  There was minimal ascites on abdominal ultrasound so paracentesis could not be performed.  Gastroenterologist recommended outpatient follow-up for endoscopy.  He was evaluated by PT and OT recommended further rehabilitation at the skilled nursing facility.  Assessment/Plan:   Active Problems:   Gait abnormality   History of fall within past 90 days   Hallucinations   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   HTN (hypertension)   History of MI (myocardial infarction)   OSA (obstructive sleep apnea)   Nondisplaced comminuted supracondylar fracture without intercondylar fracture of left humerus,  subsequent encounter for fracture with nonunion   Anxiety and depression   Confusion and disorientation   Mineralization of the globus pallidus, unsteady gait, hallucinations: EEG is pending.  Plan discussed with Dr. Doy Mince, neurologist.  She thinks unsteady gait, hallucinations and increased muscle tone may be due to mineralization of the globus pallidus which could be coming from liver cirrhosis.  RPR, TSH and CK level were normal.  Awaiting PT and OT evaluation.  Liver cirrhosis with ascites:  Acute hepatitis panel and hepatitis surface antibody tests were negative.  Limited abdominal ultrasound showed only small volume ascites and therefore paracentesis could not be performed.  Lisinopril has been discontinued.  Gastroenterologist has signed off and recommended outpatient follow-up for EGD.  Recent fall leading to displaced comminuted left humeral fracture on 10/11/2019: Analgesics as needed for pain.  Consulted Dr. Rudene Christians, orthopedic surgeon, who recommended application of fracture brace as an outpatient and nonoperative treatment.  CAD with history of angioplasty: Continue aspirin, metoprolol and Vytorin.  Anxiety and depression: Continue psychotropics  OSA: Nonadherent with CPAP.  Body mass index is 29.55 kg/m.  Diet Order            Diet Heart Room service appropriate? Yes; Fluid consistency: Thin  Diet effective now                       Medications:   . aspirin EC  81 mg Oral Daily  . buPROPion  150 mg Oral Daily  . enoxaparin (LOVENOX) injection  40 mg Subcutaneous Q24H  .  ezetimibe  10 mg Oral QHS   And  . simvastatin  80 mg Oral QHS  . FLUoxetine  10 mg Oral Daily  . montelukast  10 mg Oral Daily  . pantoprazole  40 mg Oral BID  . sodium chloride flush  3 mL Intravenous Q12H  . traZODone  300 mg Oral QHS  . venlafaxine XR  225 mg Oral Daily   Continuous Infusions:   Anti-infectives (From admission, onward)   None             Family  Communication/Anticipated D/C date and plan/Code Status   DVT prophylaxis: enoxaparin (LOVENOX) injection 40 mg Start: 10/17/19 1000     Code Status: Full Code  Family Communication: Plan discussed with his son and daughter at the bedside. Disposition Plan:    Status is: Inpatient  Remains inpatient appropriate because:Unsafe d/c plan and Inpatient level of care appropriate due to severity of illness   Dispo: The patient is from: Home              Anticipated d/c is to: SNF              Anticipated d/c date is: 2 days              Patient currently is not medically stable to d/c.           Subjective:   No new complaints.  Still has pain in the left shoulder.  No vomiting, abdominal pain or diarrhea.  Objective:    Vitals:   10/21/19 0343 10/21/19 0500 10/21/19 0830 10/21/19 1149  BP: 132/81  126/66 125/74  Pulse: 78  84 89  Resp: 16  18 14   Temp: 98 F (36.7 C)  98.7 F (37.1 C) 98.5 F (36.9 C)  TempSrc: Oral  Oral Oral  SpO2: 96%  98% 98%  Weight:  101.6 kg    Height:       No data found.   Intake/Output Summary (Last 24 hours) at 10/21/2019 1348 Last data filed at 10/21/2019 1340 Gross per 24 hour  Intake 120 ml  Output --  Net 120 ml   Filed Weights   10/19/19 0500 10/20/19 0500 10/21/19 0500  Weight: 104.3 kg 98.9 kg 101.6 kg    Exam:  GEN: No acute distress SKIN: Warm and dry EYES: Extraocular muscles intact.  Anicteric ENT: MMM CV: RRR PULM: No wheezing or rales heard ABD: soft, ND, NT, +BS CNS: AAO x 3, non focal.  He has short-term memory impairment  EXT: Left shoulder is swollen and tender.  There is ecchymosis on the left shoulder and left arm.  No lower extremity swelling or tenderness. PSYCH: Calm and cooperative    Data Reviewed:   I have personally reviewed following labs and imaging studies:  Labs: Labs show the following:   Basic Metabolic Panel: Recent Labs  Lab 10/16/19 2205 10/17/19 0858  NA 133*  --   K  4.7  --   CL 100  --   CO2 24  --   GLUCOSE 118*  --   BUN 13  --   CREATININE 0.85 0.80  CALCIUM 8.4*  --    GFR Estimated Creatinine Clearance: 115.4 mL/min (by C-G formula based on SCr of 0.8 mg/dL). Liver Function Tests: Recent Labs  Lab 10/16/19 2205  AST 81*  ALT 37  ALKPHOS 177*  BILITOT 2.9*  PROT 6.2*  ALBUMIN 2.7*   No results for input(s): LIPASE, AMYLASE in the last  168 hours. Recent Labs  Lab 10/17/19 0217  AMMONIA 30   Coagulation profile Recent Labs  Lab 10/17/19 0121  INR 1.5*    CBC: Recent Labs  Lab 10/16/19 2205 10/17/19 0858  WBC 9.5 8.8  NEUTROABS 6.7  --   HGB 14.6 13.0  HCT 40.7 36.2*  MCV 96.7 97.8  PLT 198 166   Cardiac Enzymes: Recent Labs  Lab 10/18/19 0759  CKTOTAL 114   BNP (last 3 results) No results for input(s): PROBNP in the last 8760 hours. CBG: Recent Labs  Lab 10/18/19 0454 10/19/19 0450 10/20/19 0548 10/21/19 0646  GLUCAP 100* 89 86 89   D-Dimer: No results for input(s): DDIMER in the last 72 hours. Hgb A1c: No results for input(s): HGBA1C in the last 72 hours. Lipid Profile: No results for input(s): CHOL, HDL, LDLCALC, TRIG, CHOLHDL, LDLDIRECT in the last 72 hours. Thyroid function studies: No results for input(s): TSH, T4TOTAL, T3FREE, THYROIDAB in the last 72 hours.  Invalid input(s): FREET3 Anemia work up: Recent Labs    10/19/19 1309  FERRITIN 742*  TIBC 202*  IRON 90   Sepsis Labs: Recent Labs  Lab 10/16/19 2205 10/17/19 0858  WBC 9.5 8.8    Microbiology Recent Results (from the past 240 hour(s))  SARS Coronavirus 2 by RT PCR (hospital order, performed in [facility] hospital lab) Nasopharyngeal Nasopharyngeal Swab     Status: None   Collection Time: 10/17/19  9:22 AM   Specimen: Nasopharyngeal Swab  Result Value Ref Range Status   SARS Coronavirus 2 NEGATIVE NEGATIVE Final    Comment: (NOTE) SARS-CoV-2 target nucleic acids are NOT DETECTED.  The SARS-CoV-2 RNA is generally  detectable in upper and lower respiratory specimens during the acute phase of infection. The lowest concentration of SARS-CoV-2 viral copies this assay can detect is 250 copies / mL. A negative result does not preclude SARS-CoV-2 infection and should not be used as the sole basis for treatment or other patient management decisions.  A negative result may occur with improper specimen collection / handling, submission of specimen other than nasopharyngeal swab, presence of viral mutation(s) within the areas targeted by this assay, and inadequate number of viral copies (<250 copies / mL). A negative result must be combined with clinical observations, patient history, and epidemiological information.  Fact Sheet for Patients:   StrictlyIdeas.no  Fact Sheet for Healthcare Providers: BankingDealers.co.za  This test is not yet approved or  cleared by the [place] FDA and has been authorized for detection and/or diagnosis of SARS-CoV-2 by FDA under an Emergency Use Authorization (EUA).  This EUA will remain in effect (meaning this test can be used) for the duration of the COVID-19 declaration under Section 564(b)(1) of the Act, 21 U.S.C. section 360bbb-3(b)(1), unless the authorization is terminated or revoked sooner.  Performed at [facility], [address] [place], [place] [address]     Procedures and diagnostic studies:  EEG  Result Date: 10/20/2019 Alexis Goodell, MD     10/20/2019  4:24 PM ELECTROENCEPHALOGRAM REPORT Patient: PARTICK MUSSELMAN       Room #: 109A-AA EEG No. ID: [id number] Age: 64 y.o.        Sex: male Requesting Physician: Mal Misty Report Date:  10/20/2019       Interpreting Physician: Alexis Goodell History: PHIL MICHELS is an 65 y.o. male with hallucinations Medications: ASA, Wellbutrin, Zetia, Prozac, Zocor, Desyrel, Effexor Conditions of Recording:  This is a 21 channel routine scalp EEG performed with  bipolar  and monopolar montages arranged in accordance to the international 10/20 system of electrode placement. One channel was dedicated to EKG recording. The patient is in the awake and drowsy states. Description:  The waking background activity consists of a low voltage, symmetrical, fairly well organized, 8 Hz alpha activity, seen from the parieto-occipital and posterior temporal regions.  Low voltage fast activity, poorly organized, is seen anteriorly and is at times superimposed on more posterior regions.  A mixture of theta and alpha rhythms are seen from the central and temporal regions. The patient drowses briefly with slowing to irregular, low voltage theta and beta activity.  Stage II sleep is not obtained. No epileptiform activity is noted.  Hyperventilation was not performed.  Intermittent photic stimulation was performed but failed to illicit any change in the tracing.  IMPRESSION: Normal electroencephalogram, awake, drowsy and with activation procedures. There are no focal lateralizing or epileptiform features. Alexis Goodell, MD Neurology [phone] 10/20/2019, 4:20 PM               LOS: 4 days   Eulises Kijowski  [facility]     10/21/2019, 1:48 PM

## 2019-10-21 NOTE — Care Management Important Message (Signed)
Important Message  Patient Details  Name: Alex Hampton MRN: [id number] Date of Birth: 1955/04/15   Medicare Important Message Given:  Yes     Juliann Pulse A Shariq Puig 10/21/2019, 9:19 AM

## 2019-10-22 LAB — GLUCOSE, CAPILLARY: Glucose-Capillary: 94 mg/dL (ref 70–99)

## 2019-10-22 LAB — COPPER, SERUM: Copper: 111 ug/dL (ref 69–132)

## 2019-10-22 MED ORDER — METHOCARBAMOL 500 MG PO TABS
500.0000 mg | ORAL_TABLET | Freq: Three times a day (TID) | ORAL | Status: DC | PRN
  Administered 2019-10-22: 17:00:00 500 mg via ORAL
  Filled 2019-10-22 (×2): qty 1

## 2019-10-22 MED ORDER — BISACODYL 10 MG RE SUPP
10.0000 mg | Freq: Every day | RECTAL | Status: DC | PRN
  Filled 2019-10-22: qty 1

## 2019-10-22 MED ORDER — MAGNESIUM HYDROXIDE 400 MG/5ML PO SUSP
30.0000 mL | Freq: Once | ORAL | Status: AC
  Administered 2019-10-22: 30 mL via ORAL
  Filled 2019-10-22: qty 30

## 2019-10-22 MED ORDER — POLYETHYLENE GLYCOL 3350 17 G PO PACK
17.0000 g | PACK | Freq: Two times a day (BID) | ORAL | Status: DC
  Administered 2019-10-22 – 2019-10-23 (×3): 17 g via ORAL
  Filled 2019-10-22 (×3): qty 1

## 2019-10-22 MED ORDER — SENNOSIDES-DOCUSATE SODIUM 8.6-50 MG PO TABS
2.0000 | ORAL_TABLET | Freq: Two times a day (BID) | ORAL | Status: DC
  Administered 2019-10-22 – 2019-10-23 (×3): 2 via ORAL
  Filled 2019-10-22 (×3): qty 2

## 2019-10-22 NOTE — Progress Notes (Signed)
Physical Therapy Treatment Patient Details Name: Alex Hampton MRN: [id number] DOB: [birth year] Today's Date: 10/22/2019    History of Present Illness Pt admitted for gait abnormality with complaints of SOB/Chest pain/hallucinations. Of note, previous fall on 10/11/19 with L humerus fx currently in sling at this time. History includes MI, HTN, depression, and anxiety.    PT Comments    Pt was supine in bed with sling donned to LUE and son/daughter at bedside. He agrees to PT seession and is cooperative throughout. Pt is alert throughout but does have some deficits with safety awareness/ impulsivity. Was able to exit L side of bed with supervision. In standing and ambulation required min-mod assist to prevent fall. Pt is very unsteady on feel and continues to be high fall risk. Continuous cues for improved gait kinematics and BOS for improved balance. Gait belt used throughout for safety. He will benefit form SNF at DC to address balance, strength, and safe functional mobility deficits. At conclusion of session, pt was in bed with family at bedside and bed alarm in place.    Follow Up Recommendations  SNF     Equipment Recommendations  None recommended by PT    Recommendations for Other Services       Precautions / Restrictions Precautions Precautions: Fall Required Braces or Orthoses: Sling Restrictions Weight Bearing Restrictions: Yes LUE Weight Bearing: Non weight bearing    Mobility  Bed Mobility Overal bed mobility: Needs Assistance Bed Mobility: Supine to Sit;Sit to Supine     Supine to sit: Supervision Sit to supine: Supervision   General bed mobility comments: pt slightly impulsiuve and was cued on safety and improved technique. did not required phsyical assistance  Transfers Overall transfer level: Needs assistance Equipment used: Straight cane Transfers: Sit to/from Stand Sit to Stand: Min guard         General transfer comment: CGA for safety to STS from EOB.    Ambulation/Gait Ambulation/Gait assistance: Min assist;Mod assist Gait Distance (Feet): 160 Feet Assistive device: Straight cane Gait Pattern/deviations: Narrow base of support;Scissoring;Staggering left;Staggering right;Drifts right/left;Step-through pattern Gait velocity: decreased   General Gait Details: Pt is a high fall risk. reports 6 falls in past 6 months. He required min assist throughout to prevent fall with occasional mod assist. Vcs thorughout for increased BOS and improved gait kinematics. poor dynamic balance.   Stairs             Wheelchair Mobility    Modified Rankin (Stroke Patients Only)       Balance Overall balance assessment: Needs assistance;History of Falls Sitting-balance support: Feet supported Sitting balance-Leahy Scale: Good Sitting balance - Comments: no LOB or unsteadiness in sitting   Standing balance support: Single extremity supported;During functional activity Standing balance-Leahy Scale: Poor Standing balance comment: pt very high fall risk with poor static and dynamic balance                            Cognition Arousal/Alertness: Awake/alert Behavior During Therapy: WFL for tasks assessed/performed Overall Cognitive Status: Within Functional Limits for tasks assessed                                 General Comments: Pt is A and o x 4 but requires re-direction and re-focusing throughout      Exercises Other Exercises Other Exercises: Therapist discussed importance of performing LE and RUE strengthing to improve  mobility and safety. discussed importance of having strong ankles/hips for improved balance. Pt states understanding and was able to perform I'ly    General Comments        Pertinent Vitals/Pain Pain Assessment: 0-10 Pain Score: 3  Pain Location: L shoulder Pain Descriptors / Indicators: Discomfort;Dull Pain Intervention(s): Limited activity within patient's tolerance;Monitored during  session;Repositioned;Premedicated before session    Home Living                      Prior Function            PT Goals (current goals can now be found in the care plan section) Acute Rehab PT Goals Patient Stated Goal: to get better Progress towards PT goals: Progressing toward goals    Frequency    Min 2X/week      PT Plan Current plan remains appropriate    Co-evaluation              AM-PAC PT "6 Clicks" Mobility   Outcome Measure  Help needed turning from your back to your side while in a flat bed without using bedrails?: A Little Help needed moving from lying on your back to sitting on the side of a flat bed without using bedrails?: A Little Help needed moving to and from a bed to a chair (including a wheelchair)?: A Lot Help needed standing up from a chair using your arms (e.g., wheelchair or bedside chair)?: A Lot Help needed to walk in hospital room?: A Lot Help needed climbing 3-5 steps with a railing? : Total 6 Click Score: 13    End of Session Equipment Utilized During Treatment: Gait belt (sling) Activity Tolerance: Patient tolerated treatment well;Patient limited by fatigue Patient left: in bed;with call bell/phone within reach;with family/visitor present Nurse Communication: Mobility status;Precautions;Weight bearing status PT Visit Diagnosis: Unsteadiness on feet (R26.81);Repeated falls (R29.6);Other abnormalities of gait and mobility (R26.89);History of falling (Z91.81);Ataxic gait (R26.0);Difficulty in walking, not elsewhere classified (R26.2);Pain Pain - Right/Left: Left Pain - part of body: Shoulder     Time: 8921-1941 PT Time Calculation (min) (ACUTE ONLY): 24 min  Charges:  $Gait Training: 8-22 mins $Therapeutic Exercise: 8-22 mins                     Julaine Fusi PTA 10/22/19, 2:35 PM

## 2019-10-22 NOTE — Progress Notes (Signed)
PROGRESS NOTE    Alex Hampton  NOB:[id number] DOB: 01-May-1955 DOA: 10/16/2019 PCP: Juluis Pitch, MD   Chief Complaint  Patient presents with  . Chest Pain    Brief Narrative:  64 year old with h/o hypertension, OSA, MI, depression and anxiety presents to ED, with sob. He was seen in ED on 7/3 and was found to have a left humerus fracture and liver cirrhosis. GI consulted , recommended outpatient work up . So far acute hepatitis panel is negative,. Pt was also found to have unsteady gait and it was followed with an MRI brain .  MRI brain did not show acute stroke. 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 EEG shows normal IN THE AWAKE, drowsy and with activation procedures. No focal lateralizing or epileptiform features. Orthopedics consulted for the left spiral humeral shaft fracture, recommended application of the fracture brace , non operative management and follow up with them as outpatient.    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  PT/OT eval recommended SNF.    LIVER CIRRHOSIS with minimal ascites.  - acute hep panel is neg.  [place] abd showed small volume ascites.  GI recommended outpatient follow up .     Recurrent falls leading to the left humerus shaft fracture.  Ortho consulted, recommended brace and non op management.  Outpatient follow up .  PT eval. rec SNF.  PAIN  control.    CAD No chest pain.  Resume aspirin, BB, STATIN.   Anxiety and depression.  Resume home meds.    OSA;       DVT prophylaxis: Lovenox.  Code Status: Full code.  Family Communication:none at bedside.  Disposition:   Status is: Inpatient  Remains inpatient appropriate because:Unsafe d/c plan   Dispo: The patient is from: Home              Anticipated d/c is to: SNF              Anticipated d/c date is: 1 day              Patient currently is medically stable to d/c.       Consultants:   Neurology.   Procedures: None.   Antimicrobials:none.    Subjective: No new complaints today.   Objective: Vitals:   10/22/19 0500 10/22/19 0814 10/22/19 1127 10/22/19 1600  BP:  122/78 121/66 116/75  Pulse:  79 84 79  Resp:  17 18 18   Temp:  97.9 F (36.6 C) 98 F (36.7 C) 98.5 F (36.9 C)  TempSrc:   Oral Oral  SpO2:  97% 100% 97%  Weight: 101 kg     Height:        Intake/Output Summary (Last 24 hours) at 10/22/2019 1636 Last data filed at 10/22/2019 0900 Gross per 24 hour  Intake 240 ml  Output --  Net 240 ml   Filed Weights   10/20/19 0500  10/21/19 0500 10/22/19 0500  Weight: 98.9 kg 101.6 kg 101 kg    Examination:  General exam: Appears calm and comfortable  Respiratory system: Clear to auscultation. Respiratory effort normal. Cardiovascular system: S1 & S2 heard, RRR. No JVD, No pedal edema. Gastrointestinal system: Abdomen is nondistended, soft and nontender.Normal bowel sounds heard. Central nervous system: Alert and oriented. No focal neurological deficits. Extremities: Symmetric 5 x 5 power. Skin: No rashes, lesions or ulcers Psychiatry:  Mood & affect appropriate.     Data Reviewed: I have personally reviewed following labs and imaging studies  CBC: Recent Labs  Lab 10/16/19 2205 10/17/19 0858  WBC 9.5 8.8  NEUTROABS 6.7  --   HGB 14.6 13.0  HCT 40.7 36.2*  MCV 96.7 97.8  PLT 198 827    Basic Metabolic  Panel: Recent Labs  Lab 10/16/19 2205 10/17/19 0858  NA 133*  --   K 4.7  --   CL 100  --   CO2 24  --   GLUCOSE 118*  --   BUN 13  --   CREATININE 0.85 0.80  CALCIUM 8.4*  --     GFR: Estimated Creatinine Clearance: 115 mL/min (by C-G formula based on SCr of 0.8 mg/dL).  Liver Function Tests: Recent Labs  Lab 10/16/19 2205  AST 81*  ALT 37  ALKPHOS 177*  BILITOT 2.9*  PROT 6.2*  ALBUMIN 2.7*    CBG: Recent Labs  Lab 10/18/19 0454 10/19/19 0450 10/20/19 0548 10/21/19 0646 10/22/19 0646  GLUCAP 100* 89 86 89 94     Recent Results (from the past 240 hour(s))  SARS Coronavirus 2 by RT PCR (hospital order, performed in [facility] hospital lab) Nasopharyngeal Nasopharyngeal Swab     Status: None   Collection Time: 10/17/19  9:22 AM   Specimen: Nasopharyngeal Swab  Result Value Ref Range Status   SARS Coronavirus 2 NEGATIVE NEGATIVE Final    Comment: (NOTE) SARS-CoV-2 target nucleic acids are NOT DETECTED.  The SARS-CoV-2 RNA is generally detectable in upper and lower respiratory specimens during the acute phase of infection. The lowest concentration of SARS-CoV-2 viral copies this assay can detect is 250 copies / mL. A negative result does not preclude SARS-CoV-2 infection and should not be used as the sole basis for treatment or other patient management decisions.  A negative result may occur with improper specimen collection / handling, submission of specimen other than nasopharyngeal swab, presence of viral mutation(s) within the areas targeted by this assay, and inadequate number of viral copies (<250 copies / mL). A negative result must be combined with clinical observations, patient history, and epidemiological information.  Fact Sheet for Patients:   StrictlyIdeas.no  Fact Sheet for Healthcare Providers: BankingDealers.co.za  This test is not yet approved or  cleared by the [place] FDA and has  been authorized for detection and/or diagnosis of SARS-CoV-2 by FDA under an Emergency Use Authorization (EUA).  This EUA will remain in effect (meaning this test can be used) for the duration of the COVID-19 declaration under Section 564(b)(1) of the Act, 21 U.S.C. section 360bbb-3(b)(1), unless the authorization is terminated or revoked sooner.  Performed at [facility], [address] [place], [place] [address]          Radiology Studies: No results found.      Scheduled Meds: . aspirin EC  81 mg Oral Daily  . buPROPion  150 mg Oral Daily  . enoxaparin (LOVENOX) injection  40 mg Subcutaneous Q24H  .  ezetimibe  10 mg Oral QHS   And  . simvastatin  80 mg Oral QHS  . FLUoxetine  10 mg Oral Daily  . montelukast  10 mg Oral Daily  . pantoprazole  40 mg Oral BID  . polyethylene glycol  17 g Oral BID  . senna-docusate  2 tablet Oral BID  . sodium chloride flush  3 mL Intravenous Q12H  . traZODone  300 mg Oral QHS  . venlafaxine XR  225 mg Oral Daily   Continuous Infusions:   LOS: 5 days       Hosie Poisson, MD [facility]   To contact the attending provider between 7A-7P or the covering provider during after hours 7P-7A, please log into the web site [email or web address] and access using universal [place] password for that web site. If you do not have the password, please call the hospital operator.  10/22/2019, 4:36 PM

## 2019-10-23 LAB — ALPHA-1 ANTITRYPSIN PHENOTYPE: A-1 Antitrypsin, Ser: 111 mg/dL (ref 101–187)

## 2019-10-23 LAB — IMMUNOGLOBULINS A/E/G/M, SERUM
IgA: 603 mg/dL — ABNORMAL HIGH (ref 61–437)
IgE (Immunoglobulin E), Serum: 177 IU/mL (ref 6–495)
IgG (Immunoglobin G), Serum: 1148 mg/dL (ref 603–1613)
IgM (Immunoglobulin M), Srm: 113 mg/dL (ref 20–172)

## 2019-10-23 MED ORDER — SENNOSIDES-DOCUSATE SODIUM 8.6-50 MG PO TABS: 2.0000 | ORAL_TABLET | Freq: Two times a day (BID) | ORAL | 0 refills | Status: DC

## 2019-10-23 MED ORDER — POLYETHYLENE GLYCOL 3350 17 G PO PACK: 17.0000 g | PACK | Freq: Two times a day (BID) | ORAL | 0 refills | Status: DC

## 2019-10-23 NOTE — Care Management Important Message (Signed)
Important Message  Patient Details  Name: Alex Hampton MRN: [id number] Date of Birth: [birth year]   Medicare Important Message Given:  Yes     Juliann Pulse A Kayin Kettering 10/23/2019, 11:15 AM

## 2019-10-23 NOTE — Discharge Summary (Signed)
Physician Discharge Summary  Alex Hampton NGE:[id number] DOB: 02-Sep-1955 DOA: 10/16/2019  PCP: Juluis Pitch, MD  Admit date: 10/16/2019 Discharge date: 10/23/2019  Admitted From: HOME.  Disposition: SNF.   Recommendations for Outpatient Follow-up:  1. Follow up with PCP in 1-2 weeks 2. Please obtain BMP/CBC in one week Please follow up Orthopedics as recommended.  Please follow up with gastroenterology as scheduled.   Discharge Condition:STABLE.  CODE STATUS: FULL CODE.  Diet recommendation: Heart Healthy    Brief/Interim Summary: 64 year old with h/o hypertension, OSA, MI, depression and anxiety presents to ED, with sob. He was seen in ED on 7/3 and was found to have a left humerus fracture and liver cirrhosis. GI consulted , recommended outpatient work up . So far acute hepatitis panel is negative,. Pt was also found to have unsteady gait and it was followed with an MRI brain .  MRI brain did not show acute stroke. 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 EEG shows normal IN THE AWAKE, drowsy and with activation procedures. No focal lateralizing or epileptiform features. Orthopedics consulted for the left spiral humeral shaft fracture, recommended application of the fracture brace , non operative management and follow up with them as outpatient.   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   PT/OT eval recommended SNF.    Liver cirrhosis  with minimal ascites.  - acute hep panel is neg.  [place] abd showed small volume ascites.  GI recommended outpatient follow up .     Recurrent falls leading to the left humerus shaft fracture.  Ortho consulted, recommended brace and non op management.  Outpatient follow up .  PT eval. rec SNF.  Pain control.    CAD No chest pain.  Resume aspirin, BB, STATIN.   Anxiety and depression.  Resume home meds.    OSA; Stable.    Essential hypertension:  Well controlled. bp parameters. Resume home meds on discharge.   Discharge Instructions  Discharge Instructions    Diet - low sodium heart healthy   Complete by: As directed    Discharge instructions   Complete by: As directed    Please follow up with Orthopedics as recommended.     Allergies as of 10/23/2019   No Known Allergies     Medication List    STOP taking these medications   amoxicillin 500 MG tablet Commonly known as: AMOXIL   clarithromycin 500 MG tablet Commonly known as: BIAXIN   lisinopril 10 MG tablet Commonly known as: ZESTRIL   meloxicam 15 MG tablet Commonly known as: MOBIC   oxyCODONE-acetaminophen 5-325 MG tablet Commonly known as: Percocet     TAKE these medications   aspirin 81 MG EC tablet Take by mouth.   buPROPion 150 MG 24 hr tablet Commonly known as: WELLBUTRIN XL Take by mouth.   ezetimibe-simvastatin 10-80 MG tablet Commonly known as: VYTORIN TAKE 1 TABLET BY MOUTH EVERY DAY  AT NIGHT   FLUoxetine 10 MG capsule Commonly known as: PROZAC Take by mouth.   fluticasone 50 MCG/ACT nasal spray Commonly known as: FLONASE SPRAY 2 SPRAYS INTO EACH NOSTRIL EVERY DAY   hydrocortisone 2.5 % lotion hydrocortisone 2.5 % lotion  APPLY TO AFFECTED AREA UP TO 3 TIMES A WEEK MONDAY,WEDNESDAY, AND FRIDAY   metoprolol tartrate 100 MG tablet Commonly known as: LOPRESSOR Take 100 mg by mouth daily as needed.   montelukast  10 MG tablet Commonly known as: SINGULAIR Take 1 tablet by mouth daily.   pantoprazole 40 MG tablet Commonly known as: PROTONIX Take 40 mg by mouth 2 (two) times daily.   polyethylene glycol 17 g packet Commonly known as: MIRALAX / GLYCOLAX Take 17 g by mouth 2 (two) times daily.   senna-docusate 8.6-50 MG tablet Commonly known as: Senokot-S Take 2 tablets by mouth 2 (two) times daily.   traZODone 150 MG tablet Commonly known as: DESYREL Take 75 mg by mouth at bedtime.   venlafaxine XR 75 MG 24 hr capsule Commonly known as: EFFEXOR-XR Take 225 mg by mouth daily.       Contact information for follow-up providers    [name], MD. Schedule an appointment as soon as possible for a visit in 2 week(s).   Specialty: Gastroenterology Contact information: [place] [address] [phone]        Hessie Knows, MD. Schedule an appointment as soon as possible for a visit in 2 week(s).   Specialty: Orthopedic Surgery Contact information: [address] [place] [address] [phone]        Juluis Pitch, MD. Schedule an appointment as soon as possible for a visit in 1 week(s).   Specialty: Family Medicine Contact information: [address] [address] [phone]            Contact information for after-discharge care    Destination    [facility] Preferred SNF .   Service: Skilled Nursing Contact information: [address] [place] [phone]                 No Known Allergies  Consultations:  Neurology  Gastroenterology.    Procedures/Studies: EEG  Result Date: 10/20/2019 Alexis Goodell, MD     10/20/2019  4:24 PM ELECTROENCEPHALOGRAM REPORT Patient: Alex Hampton       Room #: 109A-AA EEG No. ID: [id number] Age: 64 y.o.        Sex: male Requesting Physician: Mal Misty Report Date:  10/20/2019       Interpreting  Physician: Alexis Goodell History: AYOUB AREY is an 64 y.o. male with hallucinations Medications: ASA, Wellbutrin, Zetia, Prozac, Zocor, Desyrel, Effexor Conditions of Recording:  This is a 21 channel routine scalp EEG performed with bipolar and monopolar montages arranged in accordance to the international 10/20 system of electrode placement. One channel was dedicated to EKG recording. The patient is in the awake and drowsy states. Description:  The waking background activity consists of a low voltage, symmetrical, fairly well organized, 8 Hz alpha activity, seen from the parieto-occipital and posterior temporal regions.  Low voltage fast activity, poorly organized, is seen anteriorly and is at times superimposed on more posterior regions.  A mixture of theta and alpha rhythms are seen from the central and temporal regions. The patient drowses briefly with slowing to irregular, low voltage theta and beta activity.  Stage II sleep is  not obtained. No epileptiform activity is noted.  Hyperventilation was not performed.  Intermittent photic stimulation was performed but failed to illicit any change in the tracing.  IMPRESSION: Normal electroencephalogram, awake, drowsy and with activation procedures. There are no focal lateralizing or epileptiform features. Alexis Goodell, MD Neurology [phone] 10/20/2019, 4:20 PM   CT ABDOMEN PELVIS WO CONTRAST  Result Date: 10/16/2019 CLINICAL DATA:  Abdominal distension. Patient had recent fall. EXAM: CT ABDOMEN AND PELVIS WITHOUT CONTRAST TECHNIQUE: Multidetector CT imaging of the abdomen and pelvis was performed following the standard protocol without IV contrast. COMPARISON:  Included portion from chest CTA earlier today. FINDINGS: Lower chest: Assessed on concurrent chest CT. Hepatobiliary: Nodular hepatic contours consistent with cirrhosis. No focal abnormality on noncontrast exam. Distended gallbladder without gallstones. No biliary dilatation. Perihepatic ascites  measures simple fluid density. Pancreas: No ductal dilatation. No discrete peripancreatic inflammation. Spleen: Mildly enlarged spanning 14.4 cm. Small cleft laterally. Perisplenic fluid measures simple fluid density. Adrenals/Urinary Tract: Low-density thickening of the right adrenal gland without dominant nodule. Left adrenal gland is normal. There is excreted IV contrast within both renal collecting systems from preceding chest CTA. No hydronephrosis. Excreted IV contrast within the urinary bladder. Bladder is minimally distended. Stomach/Bowel: Stomach is unremarkable. There is no small bowel obstruction or obvious inflammation. Moderate colonic stool burden. There is no colonic wall thickening or inflammatory change. Appendix tentatively visualized in the right pericolic gutter. Vascular/Lymphatic: Aortic atherosclerosis. No aortic aneurysm. There is no retroperitoneal fluid. Multiple small retroperitoneal lymph nodes are likely reactive. Reproductive: Prominent prostate gland spans 5.3 cm transverse. Other: Moderate volume abdominopelvic ascites. Fluid measures simple fluid density. Mild mesenteric edema and small amount of mesenteric ascites. There is no free air. Small fat containing inguinal hernias. Musculoskeletal: No acute fracture of the lumbar spine, pelvis, or included ribs. Bilateral hip osteoarthritis. IMPRESSION: 1. Cirrhosis with mild splenomegaly and moderate volume abdominopelvic ascites. 2. Aortic Atherosclerosis (ICD10-I70.0). Electronically Signed   By: Keith Rake M.D.   On: 10/16/2019 23:32   CT Head Wo Contrast  Result Date: 10/16/2019 CLINICAL DATA:  Headache, acute, normal neuro exam EXAM: CT HEAD WITHOUT CONTRAST TECHNIQUE: Contiguous axial images were obtained from the base of the skull through the vertex without intravenous contrast. COMPARISON:  Head CT 5 days ago 10/11/2019 FINDINGS: Brain: No intracranial hemorrhage, mass effect, or midline shift. No hydrocephalus. The  basilar cisterns are patent. No evidence of territorial infarct or acute ischemia. No extra-axial or intracranial fluid collection. Vascular: No hyperdense vessel or unexpected calcification. Skull: No fracture or focal lesion. Sinuses/Orbits: Paranasal sinuses and mastoid air cells are clear. The visualized orbits are unremarkable. Other: None. IMPRESSION: Negative noncontrast head CT. Electronically Signed   By: Keith Rake M.D.   On: 10/16/2019 23:21   CT Head Wo Contrast  Result Date: 10/11/2019 CLINICAL DATA:  Ataxia.  Assess for stroke. EXAM: CT HEAD WITHOUT CONTRAST TECHNIQUE: Contiguous axial images were obtained from the base of the skull through the vertex without intravenous contrast. COMPARISON:  None. FINDINGS: Brain: No evidence of acute infarction, hemorrhage, hydrocephalus, extra-axial collection or mass lesion/mass effect. Vascular: No hyperdense vessel is noted. Skull: Normal. Negative for fracture or focal lesion. Sinuses/Orbits: No acute finding. Other: None. IMPRESSION: No focal acute intracranial abnormality identified. Electronically Signed   By: Abelardo Diesel M.D.   On: 10/11/2019 14:44   CT Angio Chest PE W and/or Wo Contrast  Result Date: 10/16/2019 CLINICAL DATA:  Shortness of breath EXAM: CT ANGIOGRAPHY CHEST WITH CONTRAST TECHNIQUE: Multidetector  CT imaging of the chest was performed using the standard protocol during bolus administration of intravenous contrast. Multiplanar CT image reconstructions and MIPs were obtained to evaluate the vascular anatomy. CONTRAST:  73mL OMNIPAQUE IOHEXOL 350 MG/ML SOLN COMPARISON:  Radiograph 10/16/2019 FINDINGS: Cardiovascular: Satisfactory opacification the pulmonary arteries to the segmental level. No pulmonary artery filling defects are identified. Central pulmonary arteries are normal caliber. Cardiomegaly with four-chamber enlargement. No pericardial effusion. Extensive coronary artery calcifications are present. Minimal plaque in the  normal caliber thoracic aorta. No acute luminal abnormality. No periaortic stranding or hemorrhage. Normal 3 vessel branching. Proximal great vessels are unremarkable aside from minimal plaque at the vertebral artery origins. Mediastinum/Nodes: No mediastinal fluid or gas. Normal thyroid gland and thoracic inlet. No acute abnormality of the trachea. Fluid-filled distal thoracic esophagus. No worrisome mediastinal or hilar adenopathy. There are extensive, asymmetric edematous changes centered upon the left shoulder and axillary soft tissues. No associated axillary adenopathy is evident. Lungs/Pleura: Low lung volumes with dependent atelectatic changes posteriorly. More bandlike areas of opacity are present in the lung bases, likely reflecting further atelectasis or scarring subpleural fat noted in both lung bases, right slightly greater than left. No pneumothorax or visible effusion. No suspicious pulmonary nodules or masses. Upper Abdomen: Moderate volume upper abdominal ascites with a nodular, heterogeneous liver surface contour as well as caudate and left lobe hypertrophy. Upper abdomen is otherwise unremarkable. Musculoskeletal: Soft tissue stranding and edematous changes of the left shoulder and axillary region, as above, likely secondary to recent humeral fracture, outside the level of imaging. No acute osseous or musculotendinous injury is identified within the field of view. Review of the MIP images confirms the above findings. IMPRESSION: 1. No acute pulmonary artery filling defects to suggest pulmonary embolism. 2. Extensive, asymmetric edematous changes centered upon the left shoulder and axillary soft tissues, likely secondary to recent humeral fracture, outside the level of imaging. 3. Atelectatic changes other acute intrathoracic or traumatic findings in the chest. 4. Cardiomegaly with four-chamber enlargement. Extensive coronary artery calcifications. 5. Moderate volume upper abdominal ascites with a  nodular, heterogeneous liver surface contour as well as caudate and left lobe hypertrophy. Findings are suggestive of cirrhosis and portal hypertension with upper abdominal ascites, findings better detailed on dedicated abdominopelvic CT. 6. Aortic Atherosclerosis (ICD10-I70.0). Electronically Signed   By: Lovena Le M.D.   On: 10/16/2019 23:29   MR BRAIN WO CONTRAST  Result Date: 10/17/2019 CLINICAL DATA:  Shortness of breath. Acute neuro deficit with stroke suspected. Abnormal gait. EXAM: MRI HEAD WITHOUT CONTRAST TECHNIQUE: Multiplanar, multiecho pulse sequences of the brain and surrounding structures were obtained without intravenous contrast. COMPARISON:  Head CT from yesterday FINDINGS: Brain: No acute infarction, hemorrhage, hydrocephalus, extra-axial collection or mass lesion. Increased T1 signal at the basal ganglia correlating with cirrhosis by recent abdominal CT. Small FLAIR hyperintensities randomly scattered in the cerebral white matter, likely chronic small vessel ischemia-mild in extent. Brain volume is normal. Vascular: Normal flow voids accounting for arachnoid granulation at the right transverse sigmoid junction. Skull and upper cervical spine: Normal marrow signal Sinuses/Orbits: Negative IMPRESSION: 1. No acute or focal finding. 2. Globus pallidus mineralization correlating with cirrhosis. Electronically Signed   By: Monte Fantasia M.D.   On: 10/17/2019 07:06   US Carotid Bilateral  Result Date: 10/17/2019 CLINICAL DATA:  Syncope and collapse EXAM: BILATERAL CAROTID DUPLEX ULTRASOUND TECHNIQUE: Pearline Cables scale imaging, color Doppler and duplex ultrasound were performed of bilateral carotid and vertebral arteries in the neck. COMPARISON:  None. FINDINGS:  Criteria: Quantification of carotid stenosis is based on velocity parameters that correlate the residual internal carotid diameter with NASCET-based stenosis levels, using the diameter of the distal internal carotid lumen as the denominator  for stenosis measurement. The following velocity measurements were obtained: RIGHT ICA: 89/21 cm/sec CCA: 20/94 cm/sec SYSTOLIC ICA/CCA RATIO:  1.0 ECA: 134 cm/sec LEFT ICA: 80/17 cm/sec CCA: 709/62 cm/sec SYSTOLIC ICA/CCA RATIO:  0.8 ECA: 163 cm/sec RIGHT CAROTID ARTERY: Minor echogenic shadowing plaque formation. No hemodynamically significant right ICA stenosis, velocity elevation, or turbulent flow. Degree of narrowing less than 50%. RIGHT VERTEBRAL ARTERY:  Normal antegrade flow LEFT CAROTID ARTERY: Similar scattered minor echogenic plaque formation. No hemodynamically significant left ICA stenosis, velocity elevation, or turbulent flow. LEFT VERTEBRAL ARTERY:  Normal antegrade flow IMPRESSION: Minor carotid atherosclerosis. No hemodynamically significant ICA stenosis. Degree of narrowing less than 50% bilaterally by ultrasound criteria. Patent antegrade vertebral flow bilaterally Electronically Signed   By: Jerilynn Mages.  Shick M.D.   On: 10/17/2019 10:38   US Abdomen Limited  Result Date: 10/18/2019 CLINICAL DATA:  Abdominal distension. Cirrhosis with ascites described on recent CT EXAM: LIMITED ABDOMEN ULTRASOUND FOR ASCITES TECHNIQUE: Limited ultrasound survey for ascites was performed in all four abdominal quadrants. COMPARISON:  CT 10/16/2019 FINDINGS: Only a small amount of perihepatic and right lower quadrant ascites was identified. There is a moderate amount of bowel gas. No safe pocket for diagnostic or therapeutic paracentesis. IMPRESSION: Small volume ascites.  Paracentesis deferred. Electronically Signed   By: Lucrezia Europe M.D.   On: 10/18/2019 15:29   DG Chest Portable 1 View  Result Date: 10/16/2019 CLINICAL DATA:  Shortness of breath EXAM: PORTABLE CHEST 1 VIEW COMPARISON:  None FINDINGS: Low lung volumes with streaky opacities in bases favoring atelectatic change as well as more bandlike opacities in the left mid lung which could reflect further subsegmental atelectasis or scarring. Central vascular  crowding is noted. Cardiac silhouette within expected normals for portable technique. The aorta is calcified. The remaining cardiomediastinal contours are unremarkable. No acute osseous or soft tissue abnormality. Degenerative changes are present in the imaged spine and shoulders. Telemetry leads overlie the chest. IMPRESSION: Low lung volumes with basilar atelectasis. Bandlike opacities in the left mid lung may reflect further atelectasis or scarring. Electronically Signed   By: Lovena Le M.D.   On: 10/16/2019 22:47   DG Humerus Left  Result Date: 10/11/2019 CLINICAL DATA:  Status post fall this morning with left arm pain EXAM: LEFT HUMERUS - 2+ VIEW COMPARISON:  None. FINDINGS: Comminuted displaced fracture of the proximal and mid left humerus are noted. The visualized lung fields are clear. There is no dislocation. IMPRESSION: Comminuted displaced fracture of the proximal and mid left humerus. Electronically Signed   By: Abelardo Diesel M.D.   On: 10/11/2019 14:46   ECHOCARDIOGRAM COMPLETE  Result Date: 10/17/2019    ECHOCARDIOGRAM REPORT   Patient Name:   LANGDON CROSSON Date of Exam: 10/17/2019 Medical Rec #:  [id number]     Height:       73.0 in Accession #:    [id number]    Weight:       245.0 lb Date of Birth:  [birth year]      BSA:          2.345 m Patient Age:    76 years      BP:           129/68 mmHg Patient Gender: M  HR:           58 bpm. Exam Location:  [facility] Procedure: 2D Echo, Color Doppler and Cardiac Doppler Indications:     Syncope 780.2  History:         Patient has no prior history of Echocardiogram examinations.                  Previous Myocardial Infarction; Risk Factors:Hypertension.  Sonographer:     Sherrie Sport RDCS (AE) Referring Phys:  [id number] Athena Masse Diagnosing Phys: Serafina Royals MD  Sonographer Comments: Technically challenging study due to limited acoustic windows, no apical window and no subcostal window. Patient has fractured left shoulder and left arm is in a  sling- kept supine. IMPRESSIONS  1. Left ventricular ejection fraction, by estimation, is 50 to 55%. The left ventricle has low normal function. The left ventricle has no regional wall motion abnormalities. Left ventricular diastolic function could not be evaluated.  2. Right ventricular systolic function is normal. The right ventricular size is normal.  3. The mitral valve was not well visualized. No evidence of mitral valve regurgitation.  4. The aortic valve is normal in structure. Aortic valve regurgitation is not visualized. FINDINGS  Left Ventricle: Left ventricular ejection fraction, by estimation, is 50 to 55%. The left ventricle has low normal function. The left ventricle has no regional wall motion abnormalities. The left ventricular internal cavity size was normal in size. There is no left ventricular hypertrophy. Left ventricular diastolic function could not be evaluated. Right Ventricle: The right ventricular size is normal. No increase in right ventricular wall thickness. Right ventricular systolic function is normal. Left Atrium: Left atrial size was normal in size. Right Atrium: Right atrial size was normal in size. Pericardium: The pericardium was not assessed. Mitral Valve: The mitral valve was not well visualized. No evidence of mitral valve regurgitation. Tricuspid Valve: The tricuspid valve is not well visualized. Tricuspid valve regurgitation is not demonstrated. Aortic Valve: The aortic valve is normal in structure. Aortic valve regurgitation is not visualized. Pulmonic Valve: The pulmonic valve was not well visualized. Pulmonic valve regurgitation is not visualized. Aorta: The aortic root was not well visualized. IAS/Shunts: The interatrial septum was not assessed.  LEFT VENTRICLE PLAX 2D LVIDd:         4.71 cm LVIDs:         3.22 cm LV PW:         1.55 cm LV IVS:        1.61 cm LVOT diam:     2.20 cm LVOT Area:     3.80 cm  LEFT ATRIUM         Index LA diam:    4.20 cm 1.79 cm/m                         PULMONIC VALVE AORTA                 PV Vmax:        0.91 m/s Ao Root diam: 3.70 cm PV Peak grad:   3.3 mmHg                       RVOT Peak grad: 5 mmHg   SHUNTS Systemic Diam: 2.20 cm Serafina Royals MD Electronically signed by Serafina Royals MD Signature Date/Time: 10/17/2019/7:04:53 PM    Final        Subjective: No new complaints.  Discharge Exam:  Vitals:   10/22/19 2346 10/23/19 0822  BP: 140/84 140/72  Pulse: 81 82  Resp: 18 20  Temp: 98.7 F (37.1 C) 97.8 F (36.6 C)  SpO2: 96% 97%   Vitals:   10/22/19 2023 10/22/19 2346 10/23/19 0500 10/23/19 0822  BP: 128/79 140/84  140/72  Pulse: 82 81  82  Resp: 18 18  20   Temp: 97.9 F (36.6 C) 98.7 F (37.1 C)  97.8 F (36.6 C)  TempSrc: Oral Oral  Oral  SpO2: 96% 96%  97%  Weight:   99.3 kg   Height:        General: Pt is alert, awake, not in acute distress Cardiovascular: RRR, S1/S2 +, no rubs, no gallops Respiratory: CTA bilaterally, no wheezing, no rhonchi Abdominal: Soft, NT, ND, bowel sounds + Extremities: no edema, no cyanosis    The results of significant diagnostics from this hospitalization (including imaging, microbiology, ancillary and laboratory) are listed below for reference.     Microbiology: Recent Results (from the past 240 hour(s))  SARS Coronavirus 2 by RT PCR (hospital order, performed in [facility] hospital lab) Nasopharyngeal Nasopharyngeal Swab     Status: None   Collection Time: 10/17/19  9:22 AM   Specimen: Nasopharyngeal Swab  Result Value Ref Range Status   SARS Coronavirus 2 NEGATIVE NEGATIVE Final    Comment: (NOTE) SARS-CoV-2 target nucleic acids are NOT DETECTED.  The SARS-CoV-2 RNA is generally detectable in upper and lower respiratory specimens during the acute phase of infection. The lowest concentration of SARS-CoV-2 viral copies this assay can detect is 250 copies / mL. A negative result does not preclude SARS-CoV-2 infection and should not be used as the sole  basis for treatment or other patient management decisions.  A negative result may occur with improper specimen collection / handling, submission of specimen other than nasopharyngeal swab, presence of viral mutation(s) within the areas targeted by this assay, and inadequate number of viral copies (<250 copies / mL). A negative result must be combined with clinical observations, patient history, and epidemiological information.  Fact Sheet for Patients:   StrictlyIdeas.no  Fact Sheet for Healthcare Providers: BankingDealers.co.za  This test is not yet approved or  cleared by the [place] FDA and has been authorized for detection and/or diagnosis of SARS-CoV-2 by FDA under an Emergency Use Authorization (EUA).  This EUA will remain in effect (meaning this test can be used) for the duration of the COVID-19 declaration under Section 564(b)(1) of the Act, 21 U.S.C. section 360bbb-3(b)(1), unless the authorization is terminated or revoked sooner.  Performed at [facility], [address] [place], [place] [address]      Labs: BNP (last 3 results) No results for input(s): BNP in the last 8760 hours. Basic Metabolic Panel: Recent Labs  Lab 10/16/19 2205 10/17/19 0858  NA 133*  --   K 4.7  --   CL 100  --   CO2 24  --   GLUCOSE 118*  --   BUN 13  --   CREATININE 0.85 0.80  CALCIUM 8.4*  --    Liver Function Tests: Recent Labs  Lab 10/16/19 2205  AST 81*  ALT 37  ALKPHOS 177*  BILITOT 2.9*  PROT 6.2*  ALBUMIN 2.7*   No results for input(s): LIPASE, AMYLASE in the last 168 hours. Recent Labs  Lab 10/17/19 0217  AMMONIA 30   CBC: Recent Labs  Lab 10/16/19 2205 10/17/19 0858  WBC 9.5 8.8  NEUTROABS 6.7  --  HGB 14.6 13.0  HCT 40.7 36.2*  MCV 96.7 97.8  PLT 198 166   Cardiac Enzymes: Recent Labs  Lab 10/18/19 0759  CKTOTAL 114   BNP: Invalid input(s): POCBNP CBG: Recent Labs  Lab  10/18/19 0454 10/19/19 0450 10/20/19 0548 10/21/19 0646 10/22/19 0646  GLUCAP 100* 89 86 89 94   D-Dimer No results for input(s): DDIMER in the last 72 hours. Hgb A1c No results for input(s): HGBA1C in the last 72 hours. Lipid Profile No results for input(s): CHOL, HDL, LDLCALC, TRIG, CHOLHDL, LDLDIRECT in the last 72 hours. Thyroid function studies No results for input(s): TSH, T4TOTAL, T3FREE, THYROIDAB in the last 72 hours.  Invalid input(s): FREET3 Anemia work up No results for input(s): VITAMINB12, FOLATE, FERRITIN, TIBC, IRON, RETICCTPCT in the last 72 hours. Urinalysis    Component Value Date/Time   COLORURINE YELLOW (A) 10/17/2019 0422   APPEARANCEUR CLEAR (A) 10/17/2019 0422   LABSPEC 1.011 10/17/2019 0422   PHURINE 5.0 10/17/2019 0422   GLUCOSEU NEGATIVE 10/17/2019 0422   HGBUR NEGATIVE 10/17/2019 0422   BILIRUBINUR NEGATIVE 10/17/2019 0422   KETONESUR NEGATIVE 10/17/2019 0422   PROTEINUR NEGATIVE 10/17/2019 0422   NITRITE NEGATIVE 10/17/2019 0422   LEUKOCYTESUR NEGATIVE 10/17/2019 0422   Sepsis Labs Invalid input(s): PROCALCITONIN,  WBC,  LACTICIDVEN Microbiology Recent Results (from the past 240 hour(s))  SARS Coronavirus 2 by RT PCR (hospital order, performed in [place] hospital lab) Nasopharyngeal Nasopharyngeal Swab     Status: None   Collection Time: 10/17/19  9:22 AM   Specimen: Nasopharyngeal Swab  Result Value Ref Range Status   SARS Coronavirus 2 NEGATIVE NEGATIVE Final    Comment: (NOTE) SARS-CoV-2 target nucleic acids are NOT DETECTED.  The SARS-CoV-2 RNA is generally detectable in upper and lower respiratory specimens during the acute phase of infection. The lowest concentration of SARS-CoV-2 viral copies this assay can detect is 250 copies / mL. A negative result does not preclude SARS-CoV-2 infection and should not be used as the sole basis for treatment or other patient management decisions.  A negative result may occur with improper  specimen collection / handling, submission of specimen other than nasopharyngeal swab, presence of viral mutation(s) within the areas targeted by this assay, and inadequate number of viral copies (<250 copies / mL). A negative result must be combined with clinical observations, patient history, and epidemiological information.  Fact Sheet for Patients:   StrictlyIdeas.no  Fact Sheet for Healthcare Providers: BankingDealers.co.za  This test is not yet approved or  cleared by the [place] FDA and has been authorized for detection and/or diagnosis of SARS-CoV-2 by FDA under an Emergency Use Authorization (EUA).  This EUA will remain in effect (meaning this test can be used) for the duration of the COVID-19 declaration under Section 564(b)(1) of the Act, 21 U.S.C. section 360bbb-3(b)(1), unless the authorization is terminated or revoked sooner.  Performed at [facility], [address] [place], [place] [address]      Time coordinating discharge: 34 minutes.   SIGNED:   Hosie Poisson, MD  [facility] 10/23/2019, 11:28 AM

## 2019-10-23 NOTE — Progress Notes (Signed)
Called report to Darrol Angel LPN at [facility], patient is going to room 611A

## 2019-10-23 NOTE — TOC Transition Note (Signed)
Transition of Care [facility]) - CM/SW Discharge Note   Patient Details  Name: Alex Hampton MRN: [id number] Date of Birth: 11-03-1955  Transition of Care [facility]) CM/SW Contact:  Shelbie Hutching, RN Phone Number: 10/23/2019, 1:18 PM   Clinical Narrative:     Patient is ready to discharge to [facility].  Patient will be going to room 611A.  Bedside RN will call report to [phone].  [facility] will provide transportation and will pick up patient around 2pm.   Final next level of care: [place] Barriers to Discharge: Barriers Resolved   Patient Goals and CMS Choice Patient states their goals for this hospitalization and ongoing recovery are:: To get stronger and be able to eventually go back home.  Pt agrees with SNF CMS Medicare.gov Compare Post Acute Care list provided to:: Patient Choice offered to / list presented to : Patient  Discharge Placement PASRR number recieved: 10/23/19            Patient chooses bed at: [facility] [place] Patient to be transferred to facility by: [facility] Name of family member notified: son and daughter- Jabier Mutton and [name] Patient and family notified of of transfer: 10/23/19  Discharge Plan and Services   Discharge Planning Services: CM Consult Post Acute Care Choice: [place]                               Social Determinants of Health (SDOH) Interventions     Readmission Risk Interventions No flowsheet data found.

## 2019-10-23 NOTE — TOC Progression Note (Signed)
Transition of Care [facility]) - Progression Note    Patient Details  Name: Alex Hampton MRN: [id number] Date of Birth: 1955-07-06  [facility]) CM/SW Contact  Shelbie Hutching, RN Phone Number: 10/23/2019, 11:20 AM  Clinical Narrative:    Pasrr number is [id number] F expires 12/22/2019.  Patient will go to [facility] today to room 611A.  Bedside RN will call report to [phone].   Expected Discharge Plan: [place] Barriers to Discharge: Continued Medical Work up  Expected Discharge Plan and Services Expected Discharge Plan: [place]   Discharge Planning Services: CM Consult Post Acute Care Choice: [place] Living arrangements for the past 2 months: Apartment                                       Social Determinants of Health (SDOH) Interventions    Readmission Risk Interventions No flowsheet data found.

## 2019-10-29 LAB — MISC LABCORP TEST (SEND OUT): Labcorp test code: 9985

## 2019-12-10 ENCOUNTER — Other Ambulatory Visit: Payer: Self-pay

## 2019-12-10 ENCOUNTER — Emergency Department: Payer: Medicare Other

## 2019-12-10 ENCOUNTER — Encounter: Payer: Self-pay | Admitting: Emergency Medicine

## 2019-12-10 ENCOUNTER — Emergency Department
Admission: EM | Admit: 2019-12-10 | Discharge: 2019-12-10 | Disposition: A | Discharge status: Home / Self Care | Payer: Medicare Other | Attending: Emergency Medicine | Admitting: Emergency Medicine

## 2019-12-10 DIAGNOSIS — Z7982 Long term (current) use of aspirin: Secondary | ICD-10-CM | POA: Insufficient documentation

## 2019-12-10 DIAGNOSIS — R531 Weakness: Secondary | ICD-10-CM | POA: Diagnosis not present

## 2019-12-10 DIAGNOSIS — S32020A Wedge compression fracture of second lumbar vertebra, initial encounter for closed fracture: Secondary | ICD-10-CM

## 2019-12-10 DIAGNOSIS — Z20822 Contact with and (suspected) exposure to covid-19: Secondary | ICD-10-CM | POA: Insufficient documentation

## 2019-12-10 DIAGNOSIS — M545 Low back pain: Secondary | ICD-10-CM | POA: Insufficient documentation

## 2019-12-10 DIAGNOSIS — F17200 Nicotine dependence, unspecified, uncomplicated: Secondary | ICD-10-CM | POA: Diagnosis not present

## 2019-12-10 DIAGNOSIS — I10 Essential (primary) hypertension: Secondary | ICD-10-CM | POA: Insufficient documentation

## 2019-12-10 DIAGNOSIS — R0602 Shortness of breath: Secondary | ICD-10-CM | POA: Insufficient documentation

## 2019-12-10 DIAGNOSIS — Z79899 Other long term (current) drug therapy: Secondary | ICD-10-CM | POA: Insufficient documentation

## 2019-12-10 LAB — BASIC METABOLIC PANEL
Anion gap: 11 (ref 5–15)
BUN: 11 mg/dL (ref 8–23)
CO2: 23 mmol/L (ref 22–32)
Calcium: 8.6 mg/dL — ABNORMAL LOW (ref 8.9–10.3)
Chloride: 100 mmol/L (ref 98–111)
Creatinine, Ser: 0.74 mg/dL (ref 0.61–1.24)
GFR calc Af Amer: >60 mL/min (ref >60)
GFR calc non Af Amer: >60 mL/min (ref >60)
Glucose, Bld: 101 mg/dL — ABNORMAL HIGH (ref 70–99)
Potassium: 3.6 mmol/L (ref 3.5–5.1)
Sodium: 134 mmol/L — ABNORMAL LOW (ref 135–145)

## 2019-12-10 LAB — HEPATIC FUNCTION PANEL
ALT: 38 U/L (ref 0–44)
AST: 80 U/L — ABNORMAL HIGH (ref 15–41)
Albumin: 2.5 g/dL — ABNORMAL LOW (ref 3.5–5.0)
Alkaline Phosphatase: 236 U/L — ABNORMAL HIGH (ref 38–126)
Bilirubin, Direct: 0.9 mg/dL — ABNORMAL HIGH (ref 0.0–0.2)
Indirect Bilirubin: 2.2 mg/dL — ABNORMAL HIGH (ref 0.3–0.9)
Total Bilirubin: 3.1 mg/dL — ABNORMAL HIGH (ref 0.3–1.2)
Total Protein: 6.7 g/dL (ref 6.5–8.1)

## 2019-12-10 LAB — CBC
HCT: 42.4 % (ref 39.0–52.0)
Hemoglobin: 15.7 g/dL (ref 13.0–17.0)
MCH: 35.5 pg — ABNORMAL HIGH (ref 26.0–34.0)
MCHC: 37 g/dL — ABNORMAL HIGH (ref 30.0–36.0)
MCV: 95.9 fL (ref 80.0–100.0)
Platelets: 149 10*3/uL — ABNORMAL LOW (ref 150–400)
RBC: 4.42 MIL/uL (ref 4.22–5.81)
RDW: 13.3 % (ref 11.5–15.5)
WBC: 9 10*3/uL (ref 4.0–10.5)
nRBC: 0 % (ref 0.0–0.2)

## 2019-12-10 LAB — BRAIN NATRIURETIC PEPTIDE: B Natriuretic Peptide: 74.3 pg/mL (ref 0.0–100.0)

## 2019-12-10 LAB — LIPASE, BLOOD: Lipase: 68 U/L — ABNORMAL HIGH (ref 11–51)

## 2019-12-10 LAB — SARS CORONAVIRUS 2 BY RT PCR (HOSPITAL ORDER, PERFORMED IN ~~LOC~~ HOSPITAL LAB): SARS Coronavirus 2: NEGATIVE

## 2019-12-10 LAB — AMMONIA: Ammonia: 28 umol/L (ref 9–35)

## 2019-12-10 LAB — TROPONIN I (HIGH SENSITIVITY): Troponin I (High Sensitivity): 6 ng/L (ref <18)

## 2019-12-10 MED ORDER — OXYCODONE HCL 5 MG PO TABS
10.0000 mg | ORAL_TABLET | Freq: Once | ORAL | Status: AC
  Administered 2019-12-10: 10 mg via ORAL
  Filled 2019-12-10: qty 2

## 2019-12-10 MED ORDER — MORPHINE SULFATE (PF) 4 MG/ML IV SOLN
4.0000 mg | Freq: Once | INTRAVENOUS | Status: AC
  Administered 2019-12-10: 4 mg via INTRAVENOUS
  Filled 2019-12-10: qty 1

## 2019-12-10 MED ORDER — ONDANSETRON HCL 4 MG/2ML IJ SOLN
4.0000 mg | Freq: Once | INTRAMUSCULAR | Status: AC
  Administered 2019-12-10: 4 mg via INTRAVENOUS
  Filled 2019-12-10: qty 2

## 2019-12-10 MED ORDER — SODIUM CHLORIDE 0.9 % IV BOLUS
1000.0000 mL | Freq: Once | INTRAVENOUS | Status: AC
  Administered 2019-12-10: 1000 mL via INTRAVENOUS

## 2019-12-10 NOTE — ED Triage Notes (Signed)
Presents via EMS with weakness and SOB  States he had a recent fall  also having some back pain

## 2019-12-10 NOTE — ED Notes (Signed)
Pt presents to the ED with multiple complaints. 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 Pt states he has been having weakness, NV, lower back pain, memory loss. Pt did fracture his arm in July but pt has no complaints from that injury. Pt is A&Ox4 and NAD at this time.

## 2019-12-10 NOTE — ED Notes (Signed)
Pt requesting to go back home via EMS. Explained to pt that insurance may not pay for the EMS trip r/t pt's mobility. Pt verbalized understanding at this time.

## 2019-12-10 NOTE — ED Provider Notes (Signed)
[facility] Emergency Department Provider Note  Time seen: 8:50 AM  I have reviewed the triage vital signs and the nursing notes.   HISTORY  Chief Complaint Weakness and Shortness of Breath   HPI Alex Hampton is a 64 y.o. male with a past medical history of depression, hypertension, hyperlipidemia, prior MI, history of anxiety, history of confusion, presents to the emergency department with symptoms of generalized fatigue weakness, shortness of breath and back pain.  According to the patient approximately 3 days ago he had a fall since that time he has been experiencing some lower back pain.  Denies hitting his head.  Patient states a sensation of generalized fatigue and weakness although states this has been an ongoing issue for weeks or months.  Patient also states he complained of shortness of breath starting this morning.  Patient is in no acute distress but does state moderate lower back pain.  Denies any focal weakness or numbness.  Denies any recent illnesses fever cough congestion vomiting or diarrhea.  States mild constipation.  Denies dysuria.   Patient has been fully vaccinated against Covid.  Past Medical History:  Diagnosis Date  . Depression   . History of cardiac cath   . History of heart artery stent   . Hyperlipidemia   . Hypertension   . MI (myocardial infarction) [facility])     Patient Active Problem List   Diagnosis Date Noted  . Gait abnormality 10/17/2019  . History of fall within past 90 days 10/17/2019  . Hallucinations 10/17/2019  . 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 HTN (hypertension) 10/17/2019  . History of MI (myocardial infarction) 10/17/2019  . OSA (obstructive sleep apnea) 10/17/2019  . Nondisplaced comminuted supracondylar fracture without intercondylar fracture of left humerus, subsequent encounter for fracture with nonunion 10/17/2019  . Anxiety and depression 10/17/2019  . Confusion and disorientation  10/17/2019    History reviewed. No pertinent surgical history.  Prior to Admission medications   Medication Sig Start Date End Date Taking? Authorizing Provider  aspirin 81 MG EC tablet Take by mouth.    [provider]  buPROPion (WELLBUTRIN XL) 150 MG 24 hr tablet Take by mouth. 09/18/18   [provider]  ezetimibe-simvastatin (VYTORIN) 10-80 MG tablet TAKE 1 TABLET BY MOUTH EVERY DAY AT NIGHT 01/20/19   [provider]  FLUoxetine (PROZAC) 10 MG capsule Take by mouth. 09/18/18   [provider]  fluticasone (FLONASE) 50 MCG/ACT nasal spray SPRAY 2 SPRAYS INTO EACH NOSTRIL EVERY DAY 09/30/18   [provider]  hydrocortisone 2.5 % lotion hydrocortisone 2.5 % lotion  APPLY TO AFFECTED AREA UP TO 3 TIMES A WEEK MONDAY,WEDNESDAY, AND FRIDAY    [provider]  metoprolol tartrate (LOPRESSOR) 100 MG tablet Take 100 mg by mouth daily as needed.  09/18/18   [provider]  montelukast (SINGULAIR) 10 MG tablet Take 1 tablet by mouth daily. 01/20/19   [provider]  pantoprazole (PROTONIX) 40 MG tablet Take 40 mg by mouth 2 (two) times daily. 10/16/19 10/15/20  [provider]  polyethylene glycol (MIRALAX / GLYCOLAX) 17 g packet Take 17 g by mouth 2 (two) times daily. 10/23/19   Hosie Poisson, MD  senna-docusate (SENOKOT-S) 8.6-50 MG tablet Take 2 tablets by mouth 2 (two) times daily. 10/23/19   Hosie Poisson, MD  traZODone (DESYREL) 150 MG tablet Take 75 mg by mouth at bedtime. 09/15/19   [provider]  venlafaxine XR (  EFFEXOR-XR) 75 MG 24 hr capsule Take 225 mg by mouth daily.    [provider]    No Known Allergies  No family history on file.  Social History Social History   Tobacco Use  . Smoking status: Current Some Day Smoker  . Smokeless tobacco: Never Used  Substance Use Topics  . Alcohol use: Yes    Comment: occasionally  . Drug use: Never    Review of Systems Constitutional:  Negative for fever. Cardiovascular: Negative for chest pain. Respiratory: Negative for shortness of breath. Gastrointestinal: Negative for abdominal pain, vomiting and diarrhea.  Mild constipation. Musculoskeletal: Moderate lower back pain. Neurological: Negative for headache All other ROS negative  ____________________________________________   PHYSICAL EXAM:  VITAL SIGNS: ED Triage Vitals [12/10/19 0757]  Enc Vitals Group     BP 119/81     Pulse Rate 85     Resp 18     Temp 98.3 F (36.8 C)     Temp Source Oral     SpO2 94 %     Weight 218 lb 4.1 oz (99 kg)     Height 6\' 1"  (1.854 m)     Head Circumference      [facility]      Pain Score 7     Pain Loc      Pain Edu?      Excl. in [place]?     Constitutional: Alert and oriented. Well appearing and in no distress. Eyes: Normal exam ENT      Head: Normocephalic and atraumatic.      Mouth/Throat: Mucous membranes are moist. Cardiovascular: Normal rate, regular rhythm.  Respiratory: Normal respiratory effort without tachypnea nor retractions. Breath sounds are clear  Gastrointestinal: Soft and nontender. No distention.   Musculoskeletal: Nontender with normal range of motion in all extremities.  Neurologic:  Normal speech and language. No gross focal neurologic deficits  Skin:  Skin is warm, dry and intact.  Psychiatric: Mood and affect are normal.   ____________________________________________    EKG  EKG viewed and interpreted by myself shows a normal sinus rhythm 84 bpm with a narrow QRS, normal axis, normal intervals nonspecific ST changes, some electrical interference.  ____________________________________________    RADIOLOGY  Chest x-ray negative for acute abnormality. Lumbar spine shows what appears to be a mild L2 vertebral body fracture.  ____________________________________________   INITIAL IMPRESSION / ASSESSMENT AND PLAN / ED COURSE  Pertinent labs & imaging results that were available during my  care of the patient were reviewed by me and considered in my medical decision making (see chart for details).   Patient presents to the emergency department for multiple complaints including weakness and shortness of breath.  Given the patient's recent fall with back pain we will obtain x-ray imaging of the lumbar spine.  Given his complaint of shortness of breath starting this morning we will check labs including cardiac enzymes and a chest x-ray.  Patient appears to have a history of liver cirrhosis per record review we will also check an ammonia level as well as LFTs.  Given his complaint of weakness we will obtain a urine sample.  We will obtain a chest x-ray.  We will continue to closely monitor the patient in the emergency department.  We will IV hydrate treat pain while awaiting results.  Patient agreeable to plan of care.  I reviewed the patient's records she was last discharged from the hospital approximately 6 weeks ago after an admission for shortness of  breath, confusion and at that time also had recently experienced a left humerus fracture.  Patient was sent to a skilled nursing facility following his discharge.  Patient's work-up is overall reassuring.  Labs are largely at his baseline.  Normal ammonia.  Patient's x-ray does show what appears to be an acute lumbar spine fracture.  Patient takes chronic pain medication for back pain I did discuss with the patient he should speak to his prescribing physician as he may require a short-term increase in pain medication.  Otherwise the patient appears well.  We will discharge the patient home with PCP follow-up.  Patient agreeable to plan of care.  KHRIZ LIDDY was evaluated in Emergency Department on 12/10/2019 for the symptoms described in the history of present illness. He was evaluated in the context of the global COVID-19 pandemic, which necessitated consideration that the patient might be at risk for infection with the SARS-CoV-2 virus that causes  COVID-19. Institutional protocols and algorithms that pertain to the evaluation of patients at risk for COVID-19 are in a state of rapid change based on information released by regulatory bodies including the CDC and federal and state organizations. These policies and algorithms were followed during the patient's care in the ED.  ____________________________________________   FINAL CLINICAL IMPRESSION(S) / ED DIAGNOSES  Weakness Shortness of breath   Harvest Dark, MD 12/10/19 1143

## 2019-12-10 NOTE — ED Notes (Addendum)
First Nurse Note: Pt to ED via EMS from home with c/o lower back pain, SOB, and generalized weakness. Pt has hx of Wilson's disease. Had x rays on back a few days ago. VSS per EMS.

## 2019-12-10 NOTE — ED Notes (Signed)
Pt given drink with Dr. Kerman Passey, MD approval

## 2019-12-29 ENCOUNTER — Encounter: Payer: Self-pay | Admitting: General Practice

## 2019-12-29 ENCOUNTER — Ambulatory Visit: Payer: Medicare Other | Admitting: Gastroenterology

## 2020-08-19 ENCOUNTER — Observation Stay
Admission: EM | Admit: 2020-08-19 | Discharge: 2020-08-20 | Disposition: A | Discharge status: Home / Self Care | Payer: Medicare Other | Attending: Internal Medicine | Admitting: Internal Medicine

## 2020-08-19 ENCOUNTER — Emergency Department: Payer: Medicare Other

## 2020-08-19 ENCOUNTER — Encounter
Admission: EM | Disposition: A | Discharge status: Home / Self Care | Payer: Self-pay | Source: Home / Self Care | Attending: Emergency Medicine

## 2020-08-19 ENCOUNTER — Other Ambulatory Visit: Payer: Self-pay

## 2020-08-19 DIAGNOSIS — N189 Chronic kidney disease, unspecified: Secondary | ICD-10-CM | POA: Diagnosis not present

## 2020-08-19 DIAGNOSIS — R0789 Other chest pain: Secondary | ICD-10-CM

## 2020-08-19 DIAGNOSIS — F419 Anxiety disorder, unspecified: Secondary | ICD-10-CM

## 2020-08-19 DIAGNOSIS — Z79899 Other long term (current) drug therapy: Secondary | ICD-10-CM | POA: Diagnosis not present

## 2020-08-19 DIAGNOSIS — I10 Essential (primary) hypertension: Secondary | ICD-10-CM | POA: Diagnosis present

## 2020-08-19 DIAGNOSIS — I249 Acute ischemic heart disease, unspecified: Secondary | ICD-10-CM

## 2020-08-19 DIAGNOSIS — Z992 Dependence on renal dialysis: Secondary | ICD-10-CM | POA: Diagnosis not present

## 2020-08-19 DIAGNOSIS — E1122 Type 2 diabetes mellitus with diabetic chronic kidney disease: Secondary | ICD-10-CM | POA: Diagnosis not present

## 2020-08-19 DIAGNOSIS — Z794 Long term (current) use of insulin: Secondary | ICD-10-CM | POA: Diagnosis not present

## 2020-08-19 DIAGNOSIS — E1140 Type 2 diabetes mellitus with diabetic neuropathy, unspecified: Secondary | ICD-10-CM | POA: Diagnosis not present

## 2020-08-19 DIAGNOSIS — I120 Hypertensive chronic kidney disease with stage 5 chronic kidney disease or end stage renal disease: Secondary | ICD-10-CM | POA: Diagnosis not present

## 2020-08-19 DIAGNOSIS — Z7982 Long term (current) use of aspirin: Secondary | ICD-10-CM | POA: Diagnosis not present

## 2020-08-19 DIAGNOSIS — F32A Depression, unspecified: Secondary | ICD-10-CM

## 2020-08-19 DIAGNOSIS — I252 Old myocardial infarction: Secondary | ICD-10-CM

## 2020-08-19 DIAGNOSIS — D638 Anemia in other chronic diseases classified elsewhere: Secondary | ICD-10-CM

## 2020-08-19 DIAGNOSIS — I2510 Atherosclerotic heart disease of native coronary artery without angina pectoris: Secondary | ICD-10-CM | POA: Diagnosis not present

## 2020-08-19 DIAGNOSIS — Z87891 Personal history of nicotine dependence: Secondary | ICD-10-CM | POA: Diagnosis not present

## 2020-08-19 DIAGNOSIS — Z20822 Contact with and (suspected) exposure to covid-19: Secondary | ICD-10-CM | POA: Insufficient documentation

## 2020-08-19 DIAGNOSIS — Z862 Personal history of diseases of the blood and blood-forming organs and certain disorders involving the immune mechanism: Secondary | ICD-10-CM

## 2020-08-19 DIAGNOSIS — Z955 Presence of coronary angioplasty implant and graft: Secondary | ICD-10-CM | POA: Insufficient documentation

## 2020-08-19 DIAGNOSIS — R072 Precordial pain: Secondary | ICD-10-CM | POA: Diagnosis not present

## 2020-08-19 DIAGNOSIS — R079 Chest pain, unspecified: Secondary | ICD-10-CM | POA: Diagnosis present

## 2020-08-19 DIAGNOSIS — D631 Anemia in chronic kidney disease: Secondary | ICD-10-CM | POA: Insufficient documentation

## 2020-08-19 DIAGNOSIS — N186 End stage renal disease: Secondary | ICD-10-CM | POA: Diagnosis not present

## 2020-08-19 DIAGNOSIS — Z944 Liver transplant status: Secondary | ICD-10-CM

## 2020-08-19 HISTORY — PX: LEFT HEART CATH AND CORONARY ANGIOGRAPHY: CATH118249

## 2020-08-19 HISTORY — DX: Disorder of kidney and ureter, unspecified: N28.9

## 2020-08-19 LAB — BASIC METABOLIC PANEL
Anion gap: 10 (ref 5–15)
BUN: 47 mg/dL — ABNORMAL HIGH (ref 8–23)
CO2: 26 mmol/L (ref 22–32)
Calcium: 8.4 mg/dL — ABNORMAL LOW (ref 8.9–10.3)
Chloride: 96 mmol/L — ABNORMAL LOW (ref 98–111)
Creatinine, Ser: 2.59 mg/dL — ABNORMAL HIGH (ref 0.61–1.24)
GFR, Estimated: 26 mL/min — ABNORMAL LOW (ref >60)
Glucose, Bld: 150 mg/dL — ABNORMAL HIGH (ref 70–99)
Potassium: 3.9 mmol/L (ref 3.5–5.1)
Sodium: 132 mmol/L — ABNORMAL LOW (ref 135–145)

## 2020-08-19 LAB — HEPATIC FUNCTION PANEL
ALT: 15 U/L (ref 0–44)
AST: 17 U/L (ref 15–41)
Albumin: 2.8 g/dL — ABNORMAL LOW (ref 3.5–5.0)
Alkaline Phosphatase: 121 U/L (ref 38–126)
Bilirubin, Direct: <0.1 mg/dL (ref 0.0–0.2)
Total Bilirubin: 0.4 mg/dL (ref 0.3–1.2)
Total Protein: 5.5 g/dL — ABNORMAL LOW (ref 6.5–8.1)

## 2020-08-19 LAB — CBC
HCT: 22.8 % — ABNORMAL LOW (ref 39.0–52.0)
Hemoglobin: 7.5 g/dL — ABNORMAL LOW (ref 13.0–17.0)
MCH: 32.8 pg (ref 26.0–34.0)
MCHC: 32.9 g/dL (ref 30.0–36.0)
MCV: 99.6 fL (ref 80.0–100.0)
Platelets: 134 10*3/uL — ABNORMAL LOW (ref 150–400)
RBC: 2.29 MIL/uL — ABNORMAL LOW (ref 4.22–5.81)
RDW: 17.8 % — ABNORMAL HIGH (ref 11.5–15.5)
WBC: 3 10*3/uL — ABNORMAL LOW (ref 4.0–10.5)
nRBC: 0 % (ref 0.0–0.2)

## 2020-08-19 LAB — TROPONIN I (HIGH SENSITIVITY)
Troponin I (High Sensitivity): 61 ng/L — ABNORMAL HIGH (ref <18)
Troponin I (High Sensitivity): 99 ng/L — ABNORMAL HIGH (ref <18)
Troponin I (High Sensitivity): 145 ng/L (ref <18)
Troponin I (High Sensitivity): 144 ng/L (ref <18)

## 2020-08-19 LAB — RESP PANEL BY RT-PCR (FLU A&B, COVID) ARPGX2
Influenza A by PCR: NEGATIVE
Influenza B by PCR: NEGATIVE
SARS Coronavirus 2 by RT PCR: NEGATIVE

## 2020-08-19 LAB — BRAIN NATRIURETIC PEPTIDE: B Natriuretic Peptide: 561.3 pg/mL — ABNORMAL HIGH (ref 0.0–100.0)

## 2020-08-19 SURGERY — LEFT HEART CATH AND CORONARY ANGIOGRAPHY
Anesthesia: Moderate Sedation

## 2020-08-19 MED ORDER — LABETALOL HCL 5 MG/ML IV SOLN: 10.0000 mg | INTRAVENOUS | Status: AC | PRN

## 2020-08-19 MED ORDER — VALGANCICLOVIR HCL 450 MG PO TABS: 450.0000 mg | ORAL_TABLET | ORAL | Status: DC

## 2020-08-19 MED ORDER — HEPARIN (PORCINE) IN NACL 2000-0.9 UNIT/L-% IV SOLN
INTRAVENOUS | Status: DC | PRN
  Administered 2020-08-19: 1000 mL

## 2020-08-19 MED ORDER — POLYETHYLENE GLYCOL 3350 17 G PO PACK: 17.0000 g | PACK | Freq: Two times a day (BID) | ORAL | Status: DC

## 2020-08-19 MED ORDER — VENLAFAXINE HCL ER 75 MG PO CP24: 225.0000 mg | ORAL_CAPSULE | Freq: Every day | ORAL | Status: DC

## 2020-08-19 MED ORDER — FENTANYL CITRATE (PF) 100 MCG/2ML IJ SOLN
INTRAMUSCULAR | Status: AC
  Filled 2020-08-19: qty 2

## 2020-08-19 MED ORDER — BUPROPION HCL 75 MG PO TABS
75.0000 mg | ORAL_TABLET | Freq: Two times a day (BID) | ORAL | Status: DC
  Filled 2020-08-19 (×2): qty 1

## 2020-08-19 MED ORDER — EZETIMIBE 10 MG PO TABS
10.0000 mg | ORAL_TABLET | Freq: Every day | ORAL | Status: DC
  Administered 2020-08-19: 10 mg
  Filled 2020-08-19 (×2): qty 1

## 2020-08-19 MED ORDER — PANTOPRAZOLE SODIUM 40 MG PO TBEC
40.0000 mg | DELAYED_RELEASE_TABLET | Freq: Two times a day (BID) | ORAL | Status: DC
  Administered 2020-08-19 – 2020-08-20 (×2): 40 mg via ORAL
  Filled 2020-08-19 (×2): qty 1

## 2020-08-19 MED ORDER — TACROLIMUS 1 MG PO CAPS
8.0000 mg | ORAL_CAPSULE | Freq: Every day | ORAL | Status: DC
  Administered 2020-08-19: 8 mg via ORAL
  Filled 2020-08-19 (×2): qty 8

## 2020-08-19 MED ORDER — IOHEXOL 300 MG/ML  SOLN
INTRAMUSCULAR | Status: DC | PRN
  Administered 2020-08-19: 95 mL

## 2020-08-19 MED ORDER — SODIUM CHLORIDE 0.9% FLUSH
3.0000 mL | Freq: Two times a day (BID) | INTRAVENOUS | Status: DC
  Administered 2020-08-19 – 2020-08-20 (×3): 3 mL via INTRAVENOUS

## 2020-08-19 MED ORDER — ONDANSETRON HCL 4 MG/2ML IJ SOLN: 4.0000 mg | Freq: Four times a day (QID) | INTRAMUSCULAR | Status: DC | PRN

## 2020-08-19 MED ORDER — PREDNISONE 20 MG PO TABS
30.0000 mg | ORAL_TABLET | Freq: Every day | ORAL | Status: DC
  Administered 2020-08-20: 30 mg via ORAL
  Filled 2020-08-19: qty 1

## 2020-08-19 MED ORDER — POLYETHYLENE GLYCOL 3350 17 G PO PACK
17.0000 g | PACK | Freq: Two times a day (BID) | ORAL | Status: DC
  Administered 2020-08-19: 17 g via ORAL
  Filled 2020-08-19 (×2): qty 1

## 2020-08-19 MED ORDER — MIDAZOLAM HCL 2 MG/2ML IJ SOLN
INTRAMUSCULAR | Status: AC
  Filled 2020-08-19: qty 2

## 2020-08-19 MED ORDER — SIMVASTATIN 20 MG PO TABS
80.0000 mg | ORAL_TABLET | Freq: Every day | ORAL | Status: DC
  Administered 2020-08-19: 80 mg
  Filled 2020-08-19: qty 4

## 2020-08-19 MED ORDER — HEPARIN SODIUM (PORCINE) 5000 UNIT/ML IJ SOLN
5000.0000 [IU] | Freq: Three times a day (TID) | INTRAMUSCULAR | Status: DC
  Administered 2020-08-19 – 2020-08-20 (×2): 5000 [IU] via SUBCUTANEOUS
  Filled 2020-08-19 (×2): qty 1

## 2020-08-19 MED ORDER — MYCOPHENOLATE MOFETIL 250 MG PO CAPS
1000.0000 mg | ORAL_CAPSULE | Freq: Two times a day (BID) | ORAL | Status: DC
  Administered 2020-08-20: 1000 mg via ORAL
  Filled 2020-08-19 (×2): qty 4

## 2020-08-19 MED ORDER — MIDAZOLAM HCL 2 MG/2ML IJ SOLN
INTRAMUSCULAR | Status: DC | PRN
  Administered 2020-08-19: 1 mg via INTRAVENOUS

## 2020-08-19 MED ORDER — HEPARIN SODIUM (PORCINE) 1000 UNIT/ML IJ SOLN
INTRAMUSCULAR | Status: DC | PRN
  Administered 2020-08-19: 3500 [IU] via INTRAVENOUS

## 2020-08-19 MED ORDER — FLUTICASONE PROPIONATE 50 MCG/ACT NA SUSP
2.0000 | Freq: Every day | NASAL | Status: DC
  Filled 2020-08-19: qty 16

## 2020-08-19 MED ORDER — HYDROCORTISONE 2.5 % EX LOTN: TOPICAL_LOTION | CUTANEOUS | Status: DC

## 2020-08-19 MED ORDER — HEPARIN (PORCINE) IN NACL 1000-0.9 UT/500ML-% IV SOLN
INTRAVENOUS | Status: AC
  Filled 2020-08-19: qty 1000

## 2020-08-19 MED ORDER — ESCITALOPRAM OXALATE 20 MG PO TABS
20.0000 mg | ORAL_TABLET | Freq: Every day | ORAL | Status: DC
  Administered 2020-08-20: 20 mg via ORAL
  Filled 2020-08-19: qty 1

## 2020-08-19 MED ORDER — METOPROLOL TARTRATE 50 MG PO TABS: 100.0000 mg | ORAL_TABLET | Freq: Every day | ORAL | Status: DC | PRN

## 2020-08-19 MED ORDER — SODIUM CHLORIDE 0.9% FLUSH: 3.0000 mL | INTRAVENOUS | Status: DC | PRN

## 2020-08-19 MED ORDER — LIDOCAINE HCL (PF) 1 % IJ SOLN
INTRAMUSCULAR | Status: AC
  Filled 2020-08-19: qty 30

## 2020-08-19 MED ORDER — ASPIRIN 81 MG PO CHEW
81.0000 mg | CHEWABLE_TABLET | Freq: Every day | ORAL | Status: DC
  Administered 2020-08-20: 81 mg
  Filled 2020-08-19: qty 1

## 2020-08-19 MED ORDER — HYDROXYZINE HCL 10 MG PO TABS
25.0000 mg | ORAL_TABLET | Freq: Four times a day (QID) | ORAL | Status: DC | PRN
  Filled 2020-08-19: qty 3

## 2020-08-19 MED ORDER — BUPROPION HCL ER (XL) 150 MG PO TB24: 150.0000 mg | ORAL_TABLET | Freq: Every day | ORAL | Status: DC

## 2020-08-19 MED ORDER — NITROGLYCERIN 0.4 MG SL SUBL
0.4000 mg | SUBLINGUAL_TABLET | SUBLINGUAL | Status: DC | PRN
  Administered 2020-08-19 (×3): 0.4 mg via SUBLINGUAL
  Filled 2020-08-19 (×2): qty 1

## 2020-08-19 MED ORDER — GABAPENTIN 100 MG PO CAPS
100.0000 mg | ORAL_CAPSULE | Freq: Every day | ORAL | Status: DC
  Administered 2020-08-19: 100 mg via ORAL
  Filled 2020-08-19: qty 1

## 2020-08-19 MED ORDER — LIDOCAINE HCL (PF) 1 % IJ SOLN
INTRAMUSCULAR | Status: DC | PRN
  Administered 2020-08-19: 2 mL

## 2020-08-19 MED ORDER — ASPIRIN 81 MG PO CHEW
81.0000 mg | CHEWABLE_TABLET | ORAL | Status: AC
  Administered 2020-08-19: 81 mg via ORAL

## 2020-08-19 MED ORDER — ASPIRIN 81 MG PO CHEW
CHEWABLE_TABLET | ORAL | Status: AC
  Filled 2020-08-19: qty 1

## 2020-08-19 MED ORDER — VERAPAMIL HCL 2.5 MG/ML IV SOLN
INTRAVENOUS | Status: AC
  Filled 2020-08-19: qty 2

## 2020-08-19 MED ORDER — SODIUM CHLORIDE 0.9 % WEIGHT BASED INFUSION
1.0000 mL/kg/h | INTRAVENOUS | Status: AC
  Administered 2020-08-19: 1 mL/kg/h via INTRAVENOUS

## 2020-08-19 MED ORDER — TACROLIMUS 1 MG PO CAPS
7.0000 mg | ORAL_CAPSULE | Freq: Every day | ORAL | Status: DC
  Administered 2020-08-20: 7 mg via ORAL
  Filled 2020-08-19: qty 7

## 2020-08-19 MED ORDER — INSULIN ASPART 100 UNIT/ML IJ SOLN: 0.0000 [IU] | Freq: Three times a day (TID) | INTRAMUSCULAR | Status: DC

## 2020-08-19 MED ORDER — MONTELUKAST SODIUM 10 MG PO TABS
10.0000 mg | ORAL_TABLET | Freq: Every day | ORAL | Status: DC
  Filled 2020-08-19: qty 1

## 2020-08-19 MED ORDER — TRAZODONE HCL 50 MG PO TABS
75.0000 mg | ORAL_TABLET | Freq: Every day | ORAL | Status: DC
  Administered 2020-08-19: 75 mg
  Filled 2020-08-19: qty 2

## 2020-08-19 MED ORDER — FLUOXETINE HCL 10 MG PO CAPS
10.0000 mg | ORAL_CAPSULE | Freq: Every day | ORAL | Status: DC
  Filled 2020-08-19: qty 1

## 2020-08-19 MED ORDER — SENNOSIDES-DOCUSATE SODIUM 8.6-50 MG PO TABS
2.0000 | ORAL_TABLET | Freq: Two times a day (BID) | ORAL | Status: DC
  Administered 2020-08-19 – 2020-08-20 (×2): 2 via ORAL
  Filled 2020-08-19 (×2): qty 2

## 2020-08-19 MED ORDER — MIRTAZAPINE 15 MG PO TABS
15.0000 mg | ORAL_TABLET | Freq: Every day | ORAL | Status: DC
  Administered 2020-08-19: 15 mg via ORAL
  Filled 2020-08-19: qty 1

## 2020-08-19 MED ORDER — EZETIMIBE-SIMVASTATIN 10-80 MG PO TABS: 1.0000 | ORAL_TABLET | Freq: Every day | ORAL | Status: DC

## 2020-08-19 MED ORDER — HYDRALAZINE HCL 20 MG/ML IJ SOLN: 10.0000 mg | INTRAMUSCULAR | Status: AC | PRN

## 2020-08-19 MED ORDER — SODIUM CHLORIDE 0.9 % IV SOLN: 250.0000 mL | INTRAVENOUS | Status: DC | PRN

## 2020-08-19 MED ORDER — SODIUM CHLORIDE 0.9 % WEIGHT BASED INFUSION: 1.0000 mL/kg/h | INTRAVENOUS | Status: DC

## 2020-08-19 MED ORDER — MELATONIN 5 MG PO TABS
10.0000 mg | ORAL_TABLET | Freq: Every day | ORAL | Status: DC
  Administered 2020-08-19: 10 mg via ORAL
  Filled 2020-08-19: qty 2

## 2020-08-19 MED ORDER — SODIUM CHLORIDE 0.9 % WEIGHT BASED INFUSION
3.0000 mL/kg/h | INTRAVENOUS | Status: DC
  Administered 2020-08-19: 3 mL/kg/h via INTRAVENOUS

## 2020-08-19 MED ORDER — SULFAMETHOXAZOLE-TRIMETHOPRIM 800-160 MG PO TABS
1.0000 | ORAL_TABLET | ORAL | Status: DC
  Administered 2020-08-20: 1 via ORAL
  Filled 2020-08-19: qty 1

## 2020-08-19 MED ORDER — ACETAMINOPHEN 325 MG PO TABS: 650.0000 mg | ORAL_TABLET | ORAL | Status: DC | PRN

## 2020-08-19 MED ORDER — VENLAFAXINE HCL 37.5 MG PO TABS
75.0000 mg | ORAL_TABLET | Freq: Three times a day (TID) | ORAL | Status: DC
  Filled 2020-08-19 (×3): qty 2

## 2020-08-19 MED ORDER — HEPARIN SODIUM (PORCINE) 1000 UNIT/ML IJ SOLN
INTRAMUSCULAR | Status: AC
  Filled 2020-08-19: qty 1

## 2020-08-19 MED ORDER — INSULIN REGULAR HUMAN 100 UNIT/ML IJ SOLN: 0.0000 [IU] | INTRAMUSCULAR | Status: DC

## 2020-08-19 MED ORDER — METOCLOPRAMIDE HCL 10 MG PO TABS
5.0000 mg | ORAL_TABLET | Freq: Three times a day (TID) | ORAL | Status: DC
  Administered 2020-08-20: 5 mg via ORAL
  Filled 2020-08-19: qty 1

## 2020-08-19 MED ORDER — FENTANYL CITRATE (PF) 100 MCG/2ML IJ SOLN
INTRAMUSCULAR | Status: DC | PRN
  Administered 2020-08-19: 25 ug via INTRAVENOUS

## 2020-08-19 MED ORDER — SENNOSIDES-DOCUSATE SODIUM 8.6-50 MG PO TABS: 2.0000 | ORAL_TABLET | Freq: Two times a day (BID) | ORAL | Status: DC

## 2020-08-19 MED ORDER — TACROLIMUS 1 MG PO CAPS: 7.0000 mg | ORAL_CAPSULE | ORAL | Status: DC

## 2020-08-19 SURGICAL SUPPLY — 11 items
CATH INFINITI 5 FR JL3.5 (CATHETERS) ×1 IMPLANT
CATH INFINITI JR4 5F (CATHETERS) ×1 IMPLANT
DEVICE RAD TR BAND REGULAR (VASCULAR PRODUCTS) ×1 IMPLANT
DRAPE BRACHIAL (DRAPES) ×1 IMPLANT
GLIDESHEATH SLEND SS 6F .021 (SHEATH) ×1 IMPLANT
GUIDEWIRE INQWIRE 1.5J.035X260 (WIRE) IMPLANT
INQWIRE 1.5J .035X260CM (WIRE) ×2
PACK CARDIAC CATH (CUSTOM PROCEDURE TRAY) ×2 IMPLANT
PROTECTION STATION PRESSURIZED (MISCELLANEOUS) ×2
SET ATX SIMPLICITY (MISCELLANEOUS) ×1 IMPLANT
STATION PROTECTION PRESSURIZED (MISCELLANEOUS) IMPLANT

## 2020-08-19 NOTE — ED Notes (Signed)
Called pharmacy to request med req be done again and meds that pt already took this morning be retimed for tomorrow.

## 2020-08-19 NOTE — Consult Note (Signed)
[facility] Consultation Note  Patient ID: Alex Hampton, MRN: [id number], DOB/AGE: 1955/06/18 65 y.o. Admit date: 08/19/2020   Date of Consult: 08/19/2020 Primary Physician: Juluis Pitch, MD Primary Cardiologist: paraschos  Chief Complaint:  Chief Complaint  Patient presents with  . Chest Pain   Reason for Consult: nstemi  HPI: 65 y.o. male with known cad with acute cp and sob with abnormal troponin consistant with nstemi  Past Medical History:  Diagnosis Date  . Depression   . History of cardiac cath   . History of heart artery stent   . Hyperlipidemia   . Hypertension   . MI (myocardial infarction) ([place])   . Renal disorder       Surgical History:  Past Surgical History:  Procedure Laterality Date  . LIVER TRANSPLANT       Home Meds: Prior to Admission medications   Medication Sig Start Date End Date Taking? Authorizing Provider  aspirin 81 MG EC tablet Take 81 mg by mouth daily.   Yes [provider]  escitalopram (LEXAPRO) 20 MG tablet Take 20 mg by mouth daily. 08/06/20  Yes [provider]  gabapentin (NEURONTIN) 100 MG capsule Take 100 mg by mouth at bedtime.   Yes [provider]  hydrOXYzine (VISTARIL) 25 MG capsule Take 25 mg by mouth every 6 (six) hours as needed for anxiety or itching.   Yes [provider]  melatonin 3 MG TABS tablet Take 9 mg by mouth at bedtime.   Yes [provider]  metoCLOPramide (REGLAN) 5 MG tablet Take 5 mg by mouth in the morning, at noon, and at bedtime.   Yes [provider]  mirtazapine (REMERON) 15 MG tablet Take 15 mg by mouth at bedtime. 08/06/20  Yes [provider]  mycophenolate (CELLCEPT) 250 MG capsule Take 1,000 mg by mouth 2 (two) times daily.   Yes [provider]  NOVOLIN R 100 UNIT/ML injection Inject 0-11 Units into the skin See admin instructions. Take 6 units at 6pm plus correction with meal time. Correction scale with meals.   Glucose Range  <200 none, 201 - 250 mg/dL 1 unit, 251 - 300 mg/dL 2 units, 301 - 350 mg/dL 3 units, 351- 400 mg/dl 4 units , >400 mg/dL take 5 units and call your provider   Yes [provider]  pantoprazole (PROTONIX) 40 MG tablet Take 40 mg by mouth daily.   Yes [provider]  predniSONE (DELTASONE) 5 MG tablet Take 30 mg by mouth daily.   Yes [provider]  senna-docusate (SENOKOT-S) 8.6-50 MG tablet Take 2 tablets by mouth 2 (two) times daily. Patient taking differently: Take 2 tablets by mouth 2 (two) times daily as needed for mild constipation or moderate constipation. 10/23/19  Yes Hosie Poisson, MD  sulfamethoxazole-trimethoprim (BACTRIM DS) 800-160 MG tablet Take 1 tablet by mouth every Monday, Wednesday, and Friday. 08/06/20  Yes [provider]  tacrolimus (PROGRAF) 1 MG capsule Take 7-8 mg by mouth See admin instructions. Take 7 capsules (7mg ) by mouth every morning and take 8 capsules (8mg ) by mouth every evening   Yes [provider]  traZODone (DESYREL) 150 MG tablet Take 150 mg by mouth at bedtime as needed for sleep.   Yes [provider]  valGANciclovir (VALCYTE) 450 MG tablet Take 450 mg by mouth 2 (two) times a week. (Monday and Thursday)   Yes [provider]    Inpatient Medications:  . [MAR Hold] aspirin  81  mg Per Tube Daily  . [START ON 08/20/2020] aspirin  81 mg Oral Pre-Cath  . [MAR Hold] buPROPion  75 mg Per Tube BID  . [MAR Hold] escitalopram  20 mg Oral Daily  . [MAR Hold] ezetimibe  10 mg Per Tube QHS   And  . [MAR Hold] simvastatin  80 mg Per Tube QHS  . [MAR Hold] fluticasone  2 spray Each Nare Daily  . [MAR Hold] gabapentin  100 mg Oral QHS  . [MAR Hold] heparin  5,000 Units Subcutaneous Q8H  . [MAR Hold] hydrocortisone   Topical Once per day on Mon Wed Fri  . [MAR Hold] insulin regular  0-11 Units Subcutaneous See admin instructions  . [MAR Hold] melatonin  10 mg Oral QHS  . [MAR Hold]  metoCLOPramide  5 mg Oral TID AC  . [MAR Hold] mirtazapine  15 mg Oral QHS  . [MAR Hold] montelukast  10 mg Per Tube Daily  . [MAR Hold] mycophenolate  1,000 mg Oral BID  . [MAR Hold] pantoprazole  40 mg Oral BID  . [MAR Hold] polyethylene glycol  17 g Per Tube BID  . [MAR Hold] predniSONE  30 mg Oral Daily  . [MAR Hold] senna-docusate  2 tablet Per Tube BID  . [MAR Hold] sodium chloride flush  3 mL Intravenous Q12H  . [MAR Hold] sulfamethoxazole-trimethoprim  1 tablet Oral Q M,W,F  . [MAR Hold] tacrolimus  7-8 mg Oral See admin instructions  . [MAR Hold] traZODone  75 mg Per Tube QHS  . [MAR Hold] valGANciclovir  450 mg Oral Once per day on Mon Thu  . [MAR Hold] venlafaxine  75 mg Per Tube TID WC   . sodium chloride    . [START ON 08/20/2020] sodium chloride 3 mL/kg/hr (08/19/20 1339)   Followed by  . [START ON 08/20/2020] sodium chloride      Allergies: No Known Allergies  Social History   Socioeconomic History  . Marital status: Divorced    Spouse name: Not on file  . Number of children: Not on file  . Years of education: Not on file  . Highest education level: Not on file  Occupational History  . Not on file  Tobacco Use  . Smoking status: Former Research scientist (life sciences)  . Smokeless tobacco: Never Used  Substance and Sexual Activity  . Alcohol use: Yes    Comment: occasionally  . Drug use: Never  . Sexual activity: Not on file  Other Topics Concern  . Not on file  Social History Narrative  . Not on file   Social Determinants of Health   Financial Resource Strain: Not on file  Food Insecurity: Not on file  Transportation Needs: Not on file  Physical Activity: Not on file  Stress: Not on file  Social Connections: Not on file  Intimate Partner Violence: Not on file     Family History  Problem Relation Age of Onset  . Diabetes Mellitus II Mother   . Pancreatic cancer Father      Review of Systems Positive for cp sob Negative for: General:  chills, fever, night sweats or  weight changes.  Cardiovascular: PND orthopnea syncope dizziness  Dermatological skin lesions rashes Respiratory: Cough congestion Urologic: Frequent urination urination at night and hematuria Abdominal: negative for nausea, vomiting, diarrhea, bright red blood per rectum, melena, or hematemesis Neurologic: negative for visual changes, and/or hearing changes  All other systems reviewed and are otherwise negative except as noted above.  Labs: No results for input(s):  CKTOTAL, CKMB, TROPONINI in the last 72 hours. Lab Results  Component Value Date   WBC 3.0 (L) 08/19/2020   HGB 7.5 (L) 08/19/2020   HCT 22.8 (L) 08/19/2020   MCV 99.6 08/19/2020   PLT 134 (L) 08/19/2020    Recent Labs  Lab 08/19/20 0643  NA 132*  K 3.9  CL 96*  CO2 26  BUN 47*  CREATININE 2.59*  CALCIUM 8.4*  PROT 5.5*  BILITOT 0.4  ALKPHOS 121  ALT 15  AST 17  GLUCOSE 150*   No results found for: CHOL, HDL, LDLCALC, TRIG No results found for: DDIMER  Radiology/Studies:  DG Chest 2 View  Result Date: 08/19/2020 CLINICAL DATA:  Chest pain, nonradiating EXAM: CHEST - 2 VIEW COMPARISON:  12/10/2019 FINDINGS: Normal heart size and mediastinal contours. Streaky density in the bilateral lungs attributed atelectasis or scarring. Small bilateral pleural effusion and fissure thickening. Dialysis catheter with tip at the upper right atrium. Postoperative left humerus. IMPRESSION: 1. Small pleural effusions and mild vascular congestion. 2. Mild atelectasis or scarring Electronically Signed   By: Monte Fantasia M.D.   On: 08/19/2020 07:31    EKG: nsr  Weights: Filed Weights   08/19/20 0639 08/19/20 1350  Weight: 79.7 kg 77.1 kg     Physical Exam: Blood pressure 125/80, pulse 80, temperature 97.8 F (36.6 C), temperature source Oral, resp. rate 18, height 6\' 1"  (1.854 m), weight 77.1 kg, SpO2 100 %. Body mass index is 22.43 kg/m. General: Well developed, well nourished, in no acute distress. Head eyes ears  nose throat: Normocephalic, atraumatic, sclera non-icteric, no xanthomas, nares are without discharge. No apparent thyromegaly and/or mass  Lungs: Normal respiratory effort.  no wheezes, no rales, no rhonchi.  Heart: RRR with normal S1 S2. no murmur gallop, no rub, PMI is normal size and placement, carotid upstroke normal without bruit, jugular venous pressure is normal Abdomen: Soft, non-tender, non-distended with normoactive bowel sounds. No hepatomegaly. No rebound/guarding. No obvious abdominal masses. Abdominal aorta is normal size without bruit Extremities: No edema. no cyanosis, no clubbing, no ulcers  Peripheral : 2+ bilateral upper extremity pulses, 2+ bilateral femoral pulses, 2+ bilateral dorsal pedal pulse Neuro: Alert and oriented. No facial asymmetry. No focal deficit. Moves all extremities spontaneously. Musculoskeletal: Normal muscle tone without kyphosis Psych:  Responds to questions appropriately with a normal affect.    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 [facility] 08/19/2020, 1:57 PM

## 2020-08-19 NOTE — ED Notes (Signed)
Overdue AM meds are not yet verified by pharmacy.

## 2020-08-19 NOTE — ED Notes (Signed)
Pharmacy tech at bedside again.  This nurse spoke with pharmacy and asked meds to be retimed, non current meds to be removed from list. Non current meds had been ordered before med req had been performed.

## 2020-08-19 NOTE — ED Notes (Signed)
Informed Dr Francine Graven of second trop result of 99, up from 62.

## 2020-08-19 NOTE — ED Triage Notes (Signed)
Pt coming from home via EMS with a cc of centralized non-radiating chest pain that is worse while lying flat. Patient reports the pain has been present the last three nights and changes in severity but resolves in the morning. Pt states the pain was different this morning and has not resolved. Initially a 7/10 with EMS. Pt reports 3-10 tightness. Associated nausea. Took 324 ASA PTA. NSR noted. Hx of ESRD w scheduled dialysis this morning and liver transplant in November of last year.

## 2020-08-19 NOTE — ED Notes (Addendum)
Pt back from xray. Daughter at bedside.

## 2020-08-19 NOTE — ED Notes (Signed)
Sent msg to Dr Francine Graven and EDP to inform re: pt and daughter's concern about having blood levels drawn for anti rejection meds before pt takes anti rejection meds this morning.

## 2020-08-19 NOTE — ED Notes (Signed)
Informed Dr Francine Graven that gave second dose NTG for unchanged 2/10 dull aching central CP (3 total NTG given today so far).

## 2020-08-19 NOTE — Consult Note (Signed)
[facility]  CONSULT NOTE    Date: 08/19/2020                  Patient Name:  Alex Hampton  MRN: [id number]  DOB: June 25, 1955  Age / Sex: 65 y.o., male         PCP: Juluis Pitch, MD                 Service Requesting Consult: Dr. Francine Graven                 Reason for Consult: Acute Kidney Injury            History of Present Illness: [name] was admitted to [facility] on 08/19/2020 for Chest pain [R07.9]  Last hemodialysis treatment was 5/10 Tuesday.   Patient presents with his daughter who assists with history taking. Patient states this chest pain is different than his previous myocardial infarction.   Prolonged hospitalization at [facility] from 3/25 to 5/4. Patient was previously at a SNF. 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   Patient was admitted on 3/25 with chest pain. Echo showed 45-50% ejection fraction. 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   Patient was previously taken off dialysis on 06/12/2020. However required CRRT and then intermittent hemodialysis.    Medications: Outpatient medications: (Not in a hospital admission)   Current medications: Current Facility-Administered Medications  Medication Dose Route Frequency Provider Last Rate Last Admin  . acetaminophen (TYLENOL) tablet 650 mg  650 mg Oral Q4H PRN Agbata, Tochukwu, MD      . aspirin EC tablet 81 mg  81 mg Oral Daily Agbata, Tochukwu, MD      . buPROPion (WELLBUTRIN XL) 24 hr tablet 150 mg  150 mg Oral Daily Agbata, Tochukwu, MD      . ezetimibe-simvastatin (VYTORIN) 10-80 MG per tablet 1 tablet  1 tablet Oral QHS Agbata, Tochukwu, MD      . FLUoxetine (PROZAC) capsule 10 mg  10 mg Oral Daily Agbata, Tochukwu, MD      . fluticasone (FLONASE) 50 MCG/ACT nasal spray 2 spray  2 spray Each Nare Daily Agbata, Tochukwu, MD      . heparin injection 5,000  Units  5,000 Units Subcutaneous Q8H Agbata, Tochukwu, MD      . Derrill Memo ON 08/20/2020] hydrocortisone 2.5 % lotion   Topical Once per day on Mon Wed Fri Agbata, Tochukwu, MD      . metoprolol tartrate (LOPRESSOR) tablet 100 mg  100 mg Oral Daily PRN Agbata, Tochukwu, MD      . montelukast (SINGULAIR) tablet 10 mg  10 mg Oral Daily Agbata, Tochukwu, MD      . nitroGLYCERIN (NITROSTAT) SL tablet 0.4 mg  0.4 mg Sublingual Q5 min PRN Vladimir Crofts, MD   0.4 mg at 08/19/20 1139  . ondansetron (ZOFRAN) injection 4 mg  4 mg Intravenous Q6H PRN Agbata, Tochukwu, MD      . pantoprazole (PROTONIX) EC tablet 40 mg  40 mg Oral BID Agbata, Tochukwu, MD      . polyethylene glycol (MIRALAX / GLYCOLAX) packet 17 g  17 g Oral BID Agbata, Tochukwu, MD      . senna-docusate (Senokot-S) tablet 2 tablet  2 tablet Oral BID Agbata, Tochukwu, MD      . sodium chloride flush (NS) 0.9 % injection 3 mL  3 mL Intravenous Q12H Corey Skains,  MD   3 mL at 08/19/20 1111  . traZODone (DESYREL) tablet 75 mg  75 mg Oral QHS Agbata, Tochukwu, MD      . venlafaxine XR (EFFEXOR-XR) 24 hr capsule 225 mg  225 mg Oral Daily Agbata, Tochukwu, MD       Current Outpatient Medications  Medication Sig Dispense Refill  . aspirin 81 MG EC tablet Take by mouth.    Marland Kitchen buPROPion (WELLBUTRIN XL) 150 MG 24 hr tablet Take by mouth.    . ezetimibe-simvastatin (VYTORIN) 10-80 MG tablet TAKE 1 TABLET BY MOUTH EVERY DAY AT NIGHT    . FLUoxetine (PROZAC) 10 MG capsule Take by mouth.    . fluticasone (FLONASE) 50 MCG/ACT nasal spray SPRAY 2 SPRAYS INTO EACH NOSTRIL EVERY DAY    . hydrocortisone 2.5 % lotion hydrocortisone 2.5 % lotion  APPLY TO AFFECTED AREA UP TO 3 TIMES A WEEK MONDAY,WEDNESDAY, AND FRIDAY    . metoprolol tartrate (LOPRESSOR) 100 MG tablet Take 100 mg by mouth daily as needed.     . montelukast (SINGULAIR) 10 MG tablet Take 1 tablet by mouth daily.    . pantoprazole (PROTONIX) 40 MG tablet Take 40 mg by mouth 2 (two) times daily.     . polyethylene glycol (MIRALAX / GLYCOLAX) 17 g packet Take 17 g by mouth 2 (two) times daily. 14 each 0  . senna-docusate (SENOKOT-S) 8.6-50 MG tablet Take 2 tablets by mouth 2 (two) times daily. 30 tablet 0  . traZODone (DESYREL) 150 MG tablet Take 75 mg by mouth at bedtime.    Marland Kitchen venlafaxine XR (EFFEXOR-XR) 75 MG 24 hr capsule Take 225 mg by mouth daily.        Allergies: No Known Allergies    Past Medical History: Past Medical History:  Diagnosis Date  . Depression   . History of cardiac cath   . History of heart artery stent   . Hyperlipidemia   . Hypertension   . MI (myocardial infarction) ([place])   . Renal disorder      Past Surgical History: Past Surgical History:  Procedure Laterality Date  . LIVER TRANSPLANT       Family History: Family History  Problem Relation Age of Onset  . Diabetes Mellitus II Mother   . Pancreatic cancer Father      Social History: Social History   Socioeconomic History  . Marital status: Divorced    Spouse name: Not on file  . Number of children: Not on file  . Years of education: Not on file  . Highest education level: Not on file  Occupational History  . Not on file  Tobacco Use  . Smoking status: Former Research scientist (life sciences)  . Smokeless tobacco: Never Used  Substance and Sexual Activity  . Alcohol use: Yes    Comment: occasionally  . Drug use: Never  . Sexual activity: Not on file  Other Topics Concern  . Not on file  Social History Narrative  . Not on file   Social Determinants of Health   Financial Resource Strain: Not on file  Food Insecurity: Not on file  Transportation Needs: Not on file  Physical Activity: Not on file  Stress: Not on file  Social Connections: Not on file  Intimate Partner Violence: Not on file     Review of Systems: Review of Systems  Constitutional: Negative.   HENT: Negative.   Eyes: Negative.   Respiratory: Negative for cough, hemoptysis, sputum production, shortness of breath and wheezing.  Cardiovascular: Positive for chest pain. Negative for palpitations, orthopnea, claudication, leg swelling and PND.  Gastrointestinal: Negative.   Genitourinary: Negative for dysuria, flank pain, frequency, hematuria and urgency.  Musculoskeletal: Negative for back pain, falls, joint pain, myalgias and neck pain.  Skin: Negative.   Neurological: Negative.   Endo/Heme/Allergies: Negative.   Psychiatric/Behavioral: Negative.     Vital Signs: Blood pressure 112/79, pulse 79, temperature 98.3 F (36.8 C), temperature source Oral, resp. rate 19, height 6\' 1"  (1.854 m), weight 79.7 kg, SpO2 100 %.  Weight trends: Filed Weights   08/19/20 0639  Weight: 79.7 kg    Physical Exam: General: NAD, laying on stretcher  Head: Normocephalic, atraumatic. Moist oral mucosal membranes  Eyes: Anicteric, PERRL  Neck: Supple, trachea midline  Lungs:  Clear to auscultation  Heart: Regular rate and rhythm  Abdomen:  Soft, nontender, +PEG  Extremities: no peripheral edema.  Neurologic: Nonfocal, moving all four extremities  Skin: No lesions  Access: RIJ permcath     Lab results: Basic Metabolic Panel: Recent Labs  Lab 08/19/20 0643  NA 132*  K 3.9  CL 96*  CO2 26  GLUCOSE 150*  BUN 47*  CREATININE 2.59*  CALCIUM 8.4*    Liver Function Tests: Recent Labs  Lab 08/19/20 0643  AST 17  ALT 15  ALKPHOS 121  BILITOT 0.4  PROT 5.5*  ALBUMIN 2.8*   No results for input(s): LIPASE, AMYLASE in the last 168 hours. No results for input(s): AMMONIA in the last 168 hours.  CBC: Recent Labs  Lab 08/19/20 0643  WBC 3.0*  HGB 7.5*  HCT 22.8*  MCV 99.6  PLT 134*    Cardiac Enzymes: No results for input(s): CKTOTAL, CKMB, CKMBINDEX, TROPONINI in the last 168 hours.  BNP: Invalid input(s): POCBNP  CBG: No results for input(s): GLUCAP in the last 168 hours.  Microbiology: Results for orders placed or performed during the hospital encounter of 08/19/20  Resp Panel by RT-PCR  (Flu A&B, Covid) Nasopharyngeal Swab     Status: None   Collection Time: 08/19/20  7:55 AM   Specimen: Nasopharyngeal Swab; Nasopharyngeal(NP) swabs in vial transport medium  Result Value Ref Range Status   SARS Coronavirus 2 by RT PCR NEGATIVE NEGATIVE Final    Comment: (NOTE) SARS-CoV-2 target nucleic acids are NOT DETECTED.  The SARS-CoV-2 RNA is generally detectable in upper respiratory specimens during the acute phase of infection. The lowest concentration of SARS-CoV-2 viral copies this assay can detect is 138 copies/mL. A negative result does not preclude SARS-Cov-2 infection and should not be used as the sole basis for treatment or other patient management decisions. A negative result may occur with  improper specimen collection/handling, submission of specimen other than nasopharyngeal swab, presence of viral mutation(s) within the areas targeted by this assay, and inadequate number of viral copies(<138 copies/mL). A negative result must be combined with clinical observations, patient history, and epidemiological information. The expected result is Negative.  Fact Sheet for Patients:  EntrepreneurPulse.com.au  Fact Sheet for Healthcare Providers:  IncredibleEmployment.be  This test is no t yet approved or cleared by the [place] FDA and  has been authorized for detection and/or diagnosis of SARS-CoV-2 by FDA under an Emergency Use Authorization (EUA). This EUA will remain  in effect (meaning this test can be used) for the duration of the COVID-19 declaration under Section 564(b)(1) of the Act, 21 U.S.C.section 360bbb-3(b)(1), unless the authorization is terminated  or revoked sooner.       Influenza  A by PCR NEGATIVE NEGATIVE Final   Influenza B by PCR NEGATIVE NEGATIVE Final    Comment: (NOTE) The Xpert Xpress SARS-CoV-2/FLU/RSV plus assay is intended as an aid in the diagnosis of influenza from Nasopharyngeal swab specimens  and should not be used as a sole basis for treatment. Nasal washings and aspirates are unacceptable for Xpert Xpress SARS-CoV-2/FLU/RSV testing.  Fact Sheet for Patients: EntrepreneurPulse.com.au  Fact Sheet for Healthcare Providers: IncredibleEmployment.be  This test is not yet approved or cleared by the [place] FDA and has been authorized for detection and/or diagnosis of SARS-CoV-2 by FDA under an Emergency Use Authorization (EUA). This EUA will remain in effect (meaning this test can be used) for the duration of the COVID-19 declaration under Section 564(b)(1) of the Act, 21 U.S.C. section 360bbb-3(b)(1), unless the authorization is terminated or revoked.  Performed at [facility], [address] [place], [place] [address]     Coagulation Studies: No results for input(s): LABPROT, INR in the last 72 hours.  Urinalysis: No results for input(s): COLORURINE, LABSPEC, PHURINE, GLUCOSEU, HGBUR, BILIRUBINUR, KETONESUR, PROTEINUR, UROBILINOGEN, NITRITE, LEUKOCYTESUR in the last 72 hours.  Invalid input(s): APPERANCEUR    Imaging: DG Chest 2 View  Result Date: 08/19/2020 CLINICAL DATA:  Chest pain, nonradiating EXAM: CHEST - 2 VIEW COMPARISON:  12/10/2019 FINDINGS: Normal heart size and mediastinal contours. Streaky density in the bilateral lungs attributed atelectasis or scarring. Small bilateral pleural effusion and fissure thickening. Dialysis catheter with tip at the upper right atrium. Postoperative left humerus. IMPRESSION: 1. Small pleural effusions and mild vascular congestion. 2. Mild atelectasis or scarring Electronically Signed   By: Monte Fantasia M.D.   On: 08/19/2020 07:31      Assessment & Plan:  Mr. FREDDERICK SWANGER is a 65 y.o. white male with acute kidney injury requiring hemodialysis, liver transplant, coronary artery disease, hyperlipidemia, depression, hypertension, who is admitted to [facility] on 08/19/2020 for  Chest pain [R07.9]  [facility] TTS [address] RIJ permcath 71kg  1. Acute kidney injury: requiring outpatient dialysis. Last treatment was Tuesday 5/10.  NO acute indication for dialysis at this time.  Seems that patient may be recovering function.  - Will initiate IV fluids in anticipation of IV contrast exposure.   2. Liver Transplantation: on 02/18/2020.  - Continue immunosuppression regimen of tacrolimus 7mg , mycophenolate mofetil 100mg  bid, and prednisone 5mg  daily - Continue prophylactic antiviral: valganciclovir - Continue prophylactic antibiotic: TMP/SMX  3. Hypertension: 127/80. Currently not on any blood pressure agents.   4. Anemia of chronic kidney disease: hemoglobin 7.5. EPO with outpatient dialysis treatments.   LOS: 0 Therese Rocco 5/12/202211:44 AM

## 2020-08-19 NOTE — Progress Notes (Signed)
Received a call from Dr. Francine Graven stating that medications are given through PEG tube

## 2020-08-19 NOTE — Progress Notes (Signed)
Dr. Nehemiah Massed at bedside and updated patient regarding procedure results today.  Also updated patient regarding possible medication regimen and let patient know he will contact [facility] Transplant Coordinator Jeri Modena at [phone], to discuss medication options.

## 2020-08-19 NOTE — ED Notes (Signed)
Dr Francine Graven at bedside.  PRN NTG will be administered for 1/10 dull central CP.

## 2020-08-19 NOTE — ED Notes (Signed)
Informed Dr Francine Graven of last trop level 145, up from 99.  Pt desnies CP at this time, states that CP resolved to 1/10 after last NTG tab.

## 2020-08-19 NOTE — Progress Notes (Signed)
[facility] Cardiology [facility] Encounter Note  Patient: Alex Hampton / Admit Date: 08/19/2020 / Date of Encounter: 08/19/2020, 5:26 PM   Subjective: Overall patient feeling well after having chest pain earlier today with full resolution of chest pain.  Elevated troponin consistent with possible minimal non-ST elevation myocardial infarction.  Cardiac catheterization showing overall normal LV systolic function with ejection fraction of 55% Patient does have a distal PDA occlusion with collaterals from the left likely the culprit because of the current symptoms and non-ST elevation myocardial infarction Stent of circumflex artery patent Moderate atherosclerosis of ramus Minimal atherosclerosis of left anterior descending artery  Review of Systems: Positive for: None Negative for: Vision change, hearing change, syncope, dizziness, nausea, vomiting,diarrhea, bloody stool, stomach pain, cough, congestion, diaphoresis, urinary frequency, urinary pain,skin lesions, skin rashes Others previously listed  Objective: Telemetry: Normal sinus rhythm Physical Exam: Blood pressure 124/84, pulse 76, temperature 97.8 F (36.6 C), temperature source Oral, resp. rate (!) 21, height 6\' 1"  (1.854 m), weight 77.1 kg, SpO2 99 %. Body mass index is 22.43 kg/m. General: Well developed, well nourished, in no acute distress. Head: Normocephalic, atraumatic, sclera non-icteric, no xanthomas, nares are without discharge. Neck: No apparent masses Lungs: Normal respirations with no wheezes, no rhonchi, no rales , no crackles   Heart: Regular rate and rhythm, normal S1 S2, no murmur, no rub, no gallop, PMI is normal size and placement, carotid upstroke normal without bruit, jugular venous pressure normal Abdomen: Soft, non-tender, non-distended with normoactive bowel sounds. No hepatosplenomegaly. Abdominal aorta is normal size without bruit Extremities: No edema, no clubbing, no cyanosis, no ulcers,  Peripheral:  2+ radial, 2+ femoral, 2+ dorsal pedal pulses Neuro: Alert and oriented. Moves all extremities spontaneously. Psych:  Responds to questions appropriately with a normal affect.   Intake/Output Summary (Last 24 hours) at 08/19/2020 1726 Last data filed at 08/19/2020 1440 Gross per 24 hour  Intake 480 ml  Output 850 ml  Net -370 ml    Inpatient Medications:  . aspirin      . [MAR Hold] aspirin  81 mg Per Tube Daily  . [MAR Hold] buPROPion  75 mg Per Tube BID  . [MAR Hold] escitalopram  20 mg Oral Daily  . [MAR Hold] ezetimibe  10 mg Per Tube QHS   And  . [MAR Hold] simvastatin  80 mg Per Tube QHS  . [MAR Hold] fluticasone  2 spray Each Nare Daily  . [MAR Hold] gabapentin  100 mg Oral QHS  . [MAR Hold] heparin  5,000 Units Subcutaneous Q8H  . [MAR Hold] hydrocortisone   Topical Once per day on Mon Wed Fri  . [MAR Hold] insulin regular  0-11 Units Subcutaneous See admin instructions  . [MAR Hold] melatonin  10 mg Oral QHS  . [MAR Hold] metoCLOPramide  5 mg Oral TID AC  . [MAR Hold] mirtazapine  15 mg Oral QHS  . [MAR Hold] montelukast  10 mg Per Tube Daily  . [MAR Hold] mycophenolate  1,000 mg Oral BID  . [MAR Hold] pantoprazole  40 mg Oral BID  . [MAR Hold] polyethylene glycol  17 g Per Tube BID  . [MAR Hold] predniSONE  30 mg Oral Daily  . [MAR Hold] senna-docusate  2 tablet Per Tube BID  . [MAR Hold] sodium chloride flush  3 mL Intravenous Q12H  . [MAR Hold] sulfamethoxazole-trimethoprim  1 tablet Oral Q M,W,F  . [MAR Hold] tacrolimus  7-8 mg Oral See admin instructions  . [facility] Hold]  traZODone  75 mg Per Tube QHS  . [MAR Hold] valGANciclovir  450 mg Oral Once per day on Mon Thu  . [MAR Hold] venlafaxine  75 mg Per Tube TID WC   Infusions:  . sodium chloride    . [START ON 08/20/2020] sodium chloride 3 mL/kg/hr (08/19/20 1339)   Followed by  . [START ON 08/20/2020] sodium chloride    . sodium chloride      Labs: Recent Labs    08/19/20 0643  NA 132*  K 3.9  CL 96*   CO2 26  GLUCOSE 150*  BUN 47*  CREATININE 2.59*  CALCIUM 8.4*   Recent Labs    08/19/20 0643  AST 17  ALT 15  ALKPHOS 121  BILITOT 0.4  PROT 5.5*  ALBUMIN 2.8*   Recent Labs    08/19/20 0643  WBC 3.0*  HGB 7.5*  HCT 22.8*  MCV 99.6  PLT 134*   No results for input(s): CKTOTAL, CKMB, TROPONINI in the last 72 hours. Invalid input(s): POCBNP No results for input(s): HGBA1C in the last 72 hours.   Weights: Filed Weights   08/19/20 0639 08/19/20 1350  Weight: 79.7 kg 77.1 kg     Radiology/Studies:  DG Chest 2 View  Result Date: 08/19/2020 CLINICAL DATA:  Chest pain, nonradiating EXAM: CHEST - 2 VIEW COMPARISON:  12/10/2019 FINDINGS: Normal heart size and mediastinal contours. Streaky density in the bilateral lungs attributed atelectasis or scarring. Small bilateral pleural effusion and fissure thickening. Dialysis catheter with tip at the upper right atrium. Postoperative left humerus. IMPRESSION: 1. Small pleural effusions and mild vascular congestion. 2. Mild atelectasis or scarring Electronically Signed   By: Monte Fantasia M.D.   On: 08/19/2020 07:31     Assessment and Recommendation  65 y.o. male with known coronary artery disease status post previous stent placement hypertension hyperlipidemia chronic kidney disease stage V and liver disease status post transplant with a minimal non-ST elevation myocardial infarction now improved.  Cardiac catheterization showing small vessel coronary artery disease as primary cause non-ST elevation myocardial infarction with no need for stenting or other intervention at this time 1.  Would consider dual antiplatelet therapy for non-ST elevation myocardial infarction and distal vessel coronary artery disease as long as transplant coordinator agreed this would be Plavix 75 mg each day and aspirin 81 mg each day 2.  High intensity cholesterol therapy for further risk reduction in cardiovascular disease progression although understanding  that statin can cause significant liver enzyme elevation and may be contraindicated in this situation.  Would definitely continue his Zetia at this time and would defer to transplant coordinator for future intensity of statin use 3.  Continuation of metoprolol for coronary artery disease and non-ST elevation myocardial infarction.  4.  Cardiac rehabilitation 5.  Proceed to dialysis without restriction 6.  No further cardiac diagnostics necessary at this time as per above and patient okay for discharged home if ambulating well tomorrow with follow-up with Dr. Saralyn Pilar in 1 to 2 weeks  Signed, Serafina Royals M.D. FACC

## 2020-08-19 NOTE — H&P (Addendum)
History and Physical    Alex Hampton OIZ:[id number] DOB: 1955/10/10 DOA: 08/19/2020  PCP: Juluis Pitch, MD   Patient coming from: Home  I have personally briefly reviewed patient's old medical records in [place]  Chief Complaint: Chest pain  HPI: Alex Hampton is a 65 y.o. 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  Patient was recently discharged home following a prolonged hospitalization.  Patient states that he has had chest pain for about 4 days.  Chest pain is mostly central, nonradiating and occurs at night mostly when he is lying on his right side. It has been associated with nausea but no vomiting.  He rated his pain a 7 x 10 in intensity at its worst and took aspirin prior to arriving to the ER.  During my evaluation he rated his pain a 3 x 7 in intensity and received sublingual nitroglycerin with improvement in his pain. He denies having any diaphoresis, no vomiting, no palpitations, no headache, no dizziness, no lightheadedness, no fever, no chills, no cough, no urinary symptoms, no blurred vision. Labs show sodium 132, potassium 3.9, chloride 96, bicarb 26, glucose 150, BUN 47, creatinine 2.59, calcium 8.4, alkaline phosphatase 121, albumin 2.8, AST 17, ALT 15, total protein 5.5, BNP 561, troponin 61, white count 3.0, hemoglobin 7.5, hematocrit 22.8, MCV 99.6, RDW 17.8, platelet count 134 Respiratory viral panel is negative Chest x-ray reviewed by me small pleural effusions and mild vascular congestion.Mild atelectasis or scarring Twelve-lead EKG reviewed by me shows sinus rhythm with diffuse minimal ST  depression.     ED Course: Patient is a 65 year old Caucasian male who was recently discharged from the hospital following a prolonged stay where he had liver transplant complicated by acute kidney injury requiring hemodialysis.  History of coronary artery to the ER for evaluation of chest pain which she describes as mostly in the center of his chest that occurs at night and worse when he lays on his right side.  Chest pain has been associated with nausea. Patient was recently evaluated for chest pain at [facility] on 07/02/20 and work up was notable for elevated troponin, elevated BNP (1118), elevated D-dimer (2248).  He had a negative VQ scan and CT chest PE protocol was negative for pulmonary embolism.  He had a 2D echocardiogram which showed mild LV dysfunction with an LVEF of 45% and no evidence of ischemia on EKG. He has bumped his troponin from 61 >> 99 during his ER visit today. Due to his history of coronary artery disease status post stent angioplasty, chest pain relieved by nitroglycerin and rising troponin levels, observation was requested for further evaluation.     Review of Systems: As per HPI otherwise all other systems reviewed and negative.    Past Medical History:  Diagnosis Date  . Depression   . History of cardiac cath   . History of heart artery stent   . Hyperlipidemia   . Hypertension   . MI (myocardial infarction) ([place])   . Renal disorder     Past Surgical History:  Procedure Laterality Date  . LIVER TRANSPLANT       reports that he has quit smoking. He has never used smokeless tobacco. He  reports current alcohol use. He reports that he does not use drugs.  No Known Allergies  Family History  Problem Relation Age of Onset  . Diabetes Mellitus II Mother   . Pancreatic cancer Father       Prior to Admission medications   Medication Sig Start Date End Date Taking? Authorizing Provider  aspirin 81 MG EC tablet Take by mouth.    [provider]  buPROPion (WELLBUTRIN XL) 150 MG 24 hr tablet Take by mouth. 09/18/18   [provider]  ezetimibe-simvastatin (VYTORIN) 10-80 MG tablet TAKE 1 TABLET BY MOUTH EVERY DAY AT NIGHT 01/20/19   [provider]  FLUoxetine (PROZAC) 10 MG capsule Take by mouth. 09/18/18   [provider]  fluticasone (FLONASE) 50 MCG/ACT nasal spray SPRAY 2 SPRAYS INTO EACH NOSTRIL EVERY DAY 09/30/18   [provider]  hydrocortisone 2.5 % lotion hydrocortisone 2.5 % lotion  APPLY TO AFFECTED AREA UP TO 3 TIMES A WEEK MONDAY,WEDNESDAY, AND FRIDAY    [provider]  metoprolol tartrate (LOPRESSOR) 100 MG tablet Take 100 mg by mouth daily as needed.  09/18/18   [provider]  montelukast (SINGULAIR) 10 MG tablet Take 1 tablet by mouth daily. 01/20/19   [provider]  pantoprazole (PROTONIX) 40 MG tablet Take 40 mg by mouth 2 (two) times daily. 10/16/19 10/15/20  [provider]  polyethylene glycol (MIRALAX / GLYCOLAX) 17 g packet Take 17 g by mouth 2 (two) times daily. 10/23/19   Hosie Poisson, MD  senna-docusate (SENOKOT-S) 8.6-50 MG tablet Take 2 tablets by mouth 2 (two) times daily. 10/23/19   Hosie Poisson, MD  traZODone (DESYREL) 150 MG tablet Take 75 mg by mouth at bedtime. 09/15/19   [provider]  venlafaxine XR (EFFEXOR-XR) 75 MG 24 hr capsule Take 225 mg by mouth daily.    [provider]    Physical Exam: Vitals:   08/19/20 0715 08/19/20 0730 08/19/20 0800 08/19/20 0900  BP:  116/77 125/75 127/80  Pulse: 91 88 86 84  Resp: 12 (!) 21 20 (!) 21  Temp:      TempSrc:      SpO2: 99% 100% 100% 99%  Weight:      Height:         Vitals:   08/19/20 0715 08/19/20 0730 08/19/20 0800 08/19/20 0900  BP:  116/77 125/75 127/80  Pulse: 91 88 86 84  Resp: 12 (!) 21 20 (!) 21  Temp:      TempSrc:      SpO2: 99% 100% 100% 99%  Weight:      Height:          Constitutional: Alert and oriented x 3 . Not in  any apparent distress.  Chronically ill-appearing HEENT:      Head: Normocephalic and atraumatic.         Eyes: PERLA, EOMI, Conjunctivae are normal. Sclera is non-icteric.       Mouth/Throat: Mucous membranes are moist.       Neck: Supple with no signs of meningismus. Cardiovascular: Regular rate and rhythm. No murmurs, gallops, or rubs. 2+ symmetrical distal pulses are present . No JVD. No LE edema.  PermCath in the right anterior chest wall Respiratory: Respiratory effort normal .Lungs sounds clear bilaterally. No wheezes, crackles, or rhonchi.  Gastrointestinal: Soft, non tender, and non distended with positive bowel sounds.  PEG tube in place, scar across the anterior abdominal wall Genitourinary: No CVA tenderness. Musculoskeletal: Nontender with  normal range of motion in all extremities. No cyanosis, or erythema of extremities. Neurologic:  Face is symmetric. Moving all extremities. No gross focal neurologic deficits . Skin: Skin is warm, dry.  No rash or ulcers Psychiatric: Mood and affect are normal   Labs on Admission: I have personally reviewed following labs and imaging studies  CBC: Recent Labs  Lab 08/19/20 0643  WBC 3.0*  HGB 7.5*  HCT 22.8*  MCV 99.6  PLT 712*   Basic Metabolic Panel: Recent Labs  Lab 08/19/20 0643  NA 132*  K 3.9  CL 96*  CO2 26  GLUCOSE 150*  BUN 47*  CREATININE 2.59*  CALCIUM 8.4*   GFR: Estimated Creatinine Clearance: 31.6 mL/min (A) (by C-G formula based on SCr of 2.59 mg/dL (H)). Liver Function Tests: Recent Labs  Lab 08/19/20 0643  AST 17  ALT 15  ALKPHOS 121  BILITOT 0.4  PROT 5.5*  ALBUMIN 2.8*   No results for input(s): LIPASE, AMYLASE in the last 168 hours. No results for input(s): AMMONIA in the last 168 hours. Coagulation Profile: No results for input(s): INR, PROTIME in the last 168 hours. Cardiac Enzymes: No results for input(s): CKTOTAL, CKMB, CKMBINDEX, TROPONINI in the last 168 hours. BNP (last 3  results) No results for input(s): PROBNP in the last 8760 hours. HbA1C: No results for input(s): HGBA1C in the last 72 hours. CBG: No results for input(s): GLUCAP in the last 168 hours. Lipid Profile: No results for input(s): CHOL, HDL, LDLCALC, TRIG, CHOLHDL, LDLDIRECT in the last 72 hours. Thyroid Function Tests: No results for input(s): TSH, T4TOTAL, FREET4, T3FREE, THYROIDAB in the last 72 hours. Anemia Panel: No results for input(s): VITAMINB12, FOLATE, FERRITIN, TIBC, IRON, RETICCTPCT in the last 72 hours. Urine analysis:    Component Value Date/Time   COLORURINE YELLOW (A) 10/17/2019 0422   APPEARANCEUR CLEAR (A) 10/17/2019 0422   LABSPEC 1.011 10/17/2019 0422   PHURINE 5.0 10/17/2019 0422   GLUCOSEU NEGATIVE 10/17/2019 0422   HGBUR NEGATIVE 10/17/2019 0422   BILIRUBINUR NEGATIVE 10/17/2019 0422   KETONESUR NEGATIVE 10/17/2019 0422   PROTEINUR NEGATIVE 10/17/2019 0422   NITRITE NEGATIVE 10/17/2019 0422   LEUKOCYTESUR NEGATIVE 10/17/2019 0422    Radiological Exams on Admission: DG Chest 2 View  Result Date: 08/19/2020 CLINICAL DATA:  Chest pain, nonradiating EXAM: CHEST - 2 VIEW COMPARISON:  12/10/2019 FINDINGS: Normal heart size and mediastinal contours. Streaky density in the bilateral lungs attributed atelectasis or scarring. Small bilateral pleural effusion and fissure thickening. Dialysis catheter with tip at the upper right atrium. Postoperative left humerus. IMPRESSION: 1. Small pleural effusions and mild vascular congestion. 2. Mild atelectasis or scarring Electronically Signed   By: Monte Fantasia M.D.   On: 08/19/2020 07:31     Assessment/Plan Principal Problem:   Chest pain Active Problems:   HTN (hypertension)   History of MI (myocardial infarction)   Anxiety and depression   ESRD (end stage renal disease) (HCC)   History of anemia due to chronic kidney disease       Chest pain In a patient with known coronary artery disease Pain is mostly  centrally located associated with nausea and worse when he lays on the right side His troponin shows an upward trend and pain is relieved by nitroglycerin. Patient was recently evaluated for chest pain at [facility] and CT chest was negative for PE and 2D echocardiogram did not show any regional wall motion abnormality We will cycle cardiac enzymes We will consult cardiology  Continue aspirin, statins and beta-blockers      History of anxiety and depression Continue Prozac, venlafaxine and trazodone     Status post recent liver transplant Continue immunosuppressive therapy with Prograf, CellCept and prednisone.     End-stage renal disease on hemodialysis Dialysis days are T/TH/S We will request nephrology consult for renal replacement therapy    Anemia of chronic kidney disease H&H is stable    History of oropharyngeal dysphagia Patient has a PEG tube in place but is also able to take by mouth Continue tube feeds, nova source renal at 75 cc an hour for 12 hours All meds should be administered through PEG tube      DVT prophylaxis: Heparin Code Status: full code Family Communication: Patient 50% of time was spent discussing patient's condition and plan of care with him and his daughter at the bedside.  All questions and concerns have been addressed.  He verbalized understanding and agree with the plan. Disposition Plan: Back to previous home environment Consults called: Cardiology Status: Observation    Dayron Odland MD [facility]     08/19/2020, 10:43 AM

## 2020-08-19 NOTE — ED Provider Notes (Signed)
[facility] Emergency Department Provider Note ____________________________________________   Event Date/Time   First MD Initiated Contact with Patient 08/19/20 [phone]     (approximate)  I have reviewed the triage vital signs and the nursing notes.  HISTORY  Chief Complaint Chest Pain   HPI Alex Hampton is a 65 y.o. malewho presents to the ED for evaluation of chest pain.  Chart review indicates history of HTN and HLD.  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  Complicated hospital stay around the transplantation, ESRD on dialysis temporarily, since recovered and has not been dialyzed since 06/2020.  Follows primarily in the [place] system. Another complicated admission throughout the month of April, discharged on 08/11/2020 home.  Was again dialyzed during this admission.  Tracheostomy, decannulated on 4/27 On CellCept and Prograf.  Continue dialysis on T, TH,S.  24-hour home caregiver.  Patient presents to the ED from home for evaluation of of new positional chest pain over the past 3 days.  Patient reports substernal chest pressure and pain, worse when he is recumbent and supine, improved with sitting upright.  Reports "occasional twinges" of his discomfort throughout the day, but indicates he has been fine throughout the daytime, and really only feels this pain at night.  Currently reporting 1 or 2/10 substernal chest pain.  Alongside this pain, when it is present, he reports nausea without emesis.  Further reports concerned that his heart rate increases from 70 --> 90bpm when the pain is present, per his apple watch.  Reports a remote history of CAD with single stent in place 5-10 years ago.  Reports he has been tolerating p.o. intake at baseline without emesis or stool changes.  Reports voiding since his recent discharge without dysuria or hematuria.  Denies any falls, syncopal episodes, headache, injuries or trauma.  Denies shortness of breath or orthopnea.  Reports  cough at baseline without increased sputum production.   Past Medical History:  Diagnosis Date  . Depression   . History of cardiac cath   . History of heart artery stent   . Hyperlipidemia   . Hypertension   . MI (myocardial infarction) ([place])   . Renal disorder     Patient Active Problem List   Diagnosis Date Noted  . Chest pain 08/19/2020  . ESRD (end stage renal disease) ([place]) 08/19/2020  . History of anemia due to chronic kidney disease 08/19/2020  . Gait abnormality 10/17/2019  . History of fall within past 90 days 10/17/2019  . Hallucinations 10/17/2019  . 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 HTN (hypertension) 10/17/2019  . History of MI (myocardial infarction) 10/17/2019  . OSA (obstructive sleep apnea) 10/17/2019  . Nondisplaced comminuted supracondylar fracture without intercondylar fracture of left humerus, subsequent encounter for fracture with nonunion 10/17/2019  . Anxiety and depression 10/17/2019  . Confusion and disorientation 10/17/2019    Past Surgical History:  Procedure Laterality Date  . LIVER TRANSPLANT      Prior to Admission medications   Medication Sig Start Date End Date Taking? Authorizing Provider  aspirin 81 MG EC tablet Take 81 mg by mouth daily.   Yes [provider]  escitalopram (LEXAPRO) 20 MG tablet Take 20 mg by mouth daily. 08/06/20  Yes [provider]  gabapentin (NEURONTIN) 100 MG capsule Take 100 mg by mouth at bedtime.   Yes [provider]  hydrOXYzine (VISTARIL) 25 MG capsule Take 25 mg by mouth every 6 (six) hours as needed for anxiety  or itching.   Yes [provider]  melatonin 3 MG TABS tablet Take 9 mg by mouth at bedtime.   Yes [provider]  metoCLOPramide (REGLAN) 5 MG tablet Take 5 mg by mouth in the morning, at noon, and at bedtime.   Yes [provider]  mirtazapine (REMERON) 15 MG tablet Take 15 mg by mouth at bedtime. 08/06/20  Yes [provider]  mycophenolate (CELLCEPT) 250 MG capsule Take 1,000 mg by mouth 2 (two) times daily.   Yes [provider]  NOVOLIN R 100 UNIT/ML injection Inject 0-11 Units into the skin See admin instructions. Take 6 units at 6pm plus correction with meal time. Correction scale with meals.  Glucose Range  <200 none, 201 - 250 mg/dL 1 unit, 251 - 300 mg/dL 2 units, 301 - 350 mg/dL 3 units, 351- 400 mg/dl 4 units , >400 mg/dL take 5 units and call your provider   Yes [provider]  pantoprazole (PROTONIX) 40 MG tablet Take 40 mg by mouth daily.   Yes [provider]  predniSONE (DELTASONE) 5 MG tablet Take 30 mg by mouth daily.   Yes [provider]  senna-docusate (SENOKOT-S) 8.6-50 MG tablet Take 2 tablets by mouth 2 (two) times daily. Patient taking differently: Take 2 tablets by mouth 2 (two) times daily as needed for mild constipation or moderate constipation. 10/23/19  Yes Hosie Poisson, MD  sulfamethoxazole-trimethoprim (BACTRIM DS) 800-160 MG tablet Take 1 tablet by mouth every Monday, Wednesday, and Friday. 08/06/20  Yes [provider]  tacrolimus (PROGRAF) 1 MG capsule Take 7-8 mg by mouth See admin instructions. Take 7 capsules (7mg ) by mouth every morning and take 8 capsules (8mg ) by mouth every evening   Yes [provider]  traZODone (DESYREL) 150 MG tablet Take 150 mg by mouth at bedtime as needed for sleep.   Yes [provider]  valGANciclovir (VALCYTE) 450 MG tablet Take 450 mg by mouth 2 (two) times a week. (Monday and Thursday)   Yes [provider]    Allergies Patient has no known allergies.  Family History  Problem Relation Age of Onset  . Diabetes Mellitus II Mother   . Pancreatic cancer Father     Social History Social History   Tobacco Use  . Smoking status: Former Research scientist (life sciences)  . Smokeless tobacco: Never Used  Substance Use Topics  . Alcohol use: Yes    Comment: occasionally  . Drug use: Never     Review of Systems  Constitutional: No fever/chills Eyes: No visual changes. ENT: No sore throat. Cardiovascular: Positive for positional chest pain. Respiratory: Denies shortness of breath. Gastrointestinal: No abdominal pain. no vomiting.  No diarrhea.  No constipation. Positive for nausea Genitourinary: Negative for dysuria. Musculoskeletal: Negative for back pain. Skin: Negative for rash. Neurological: Negative for headaches, focal weakness or numbness.  ____________________________________________   PHYSICAL EXAM:  VITAL SIGNS: Vitals:   08/19/20 1200 08/19/20 1230  BP: 107/74 108/71  Pulse: 80 78  Resp: 17 16  Temp:    SpO2: 100% 100%     Constitutional: Alert and oriented. Well appearing and in no acute distress.  Sitting up in bed, appears comfortable, pleasant and conversational in full sentences. Eyes: Conjunctivae are normal. PERRL. EOMI. Head: Atraumatic. Nose: No congestion/rhinnorhea. Mouth/Throat: Mucous membranes are moist.  Oropharynx non-erythematous. Neck: No stridor. No cervical spine tenderness to palpation. Cardiovascular: Normal rate, regular rhythm. Grossly normal heart sounds.  Good peripheral circulation. Respiratory: Normal respiratory effort.  No retractions. Lungs CTAB. Gastrointestinal: Soft , nondistended, nontender to palpation. No CVA tenderness. Well-healed large upper transverse laceration from liver transplantation. GJ tube to the left upper quadrant.  Benign abdomen throughout. Musculoskeletal: No lower extremity tenderness nor edema.  No joint effusions. No signs of acute trauma. Neurologic:  Normal speech and language. No gross focal neurologic deficits are appreciated. No gait instability noted. Skin:  Skin is warm, dry and intact. No rash noted. Psychiatric: Mood and affect are normal. Speech and behavior are normal.  ____________________________________________   LABS (all labs ordered are listed, but only abnormal  results are displayed)  Labs Reviewed  BASIC METABOLIC PANEL - Abnormal; Notable for the following components:      Result Value   Sodium 132 (*)    Chloride 96 (*)    Glucose, Bld 150 (*)    BUN 47 (*)    Creatinine, Ser 2.59 (*)    Calcium 8.4 (*)    GFR, Estimated 26 (*)    All other components within normal limits  CBC - Abnormal; Notable for the following components:   WBC 3.0 (*)    RBC 2.29 (*)    Hemoglobin 7.5 (*)    HCT 22.8 (*)    RDW 17.8 (*)    Platelets 134 (*)    All other components within normal limits  HEPATIC FUNCTION PANEL - Abnormal; Notable for the following components:   Total Protein 5.5 (*)    Albumin 2.8 (*)    All other components within normal limits  BRAIN NATRIURETIC PEPTIDE - Abnormal; Notable for the following components:   B Natriuretic Peptide 561.3 (*)    All other components within normal limits  TROPONIN I (HIGH SENSITIVITY) - Abnormal; Notable for the following components:   Troponin I (High Sensitivity) 61 (*)    All other components within normal limits  TROPONIN I (HIGH SENSITIVITY) - Abnormal; Notable for the following components:   Troponin I (High Sensitivity) 99 (*)    All other components within normal limits          All other components within normal limits  RESP PANEL BY RT-PCR (FLU A&B, COVID) ARPGX2  TACROLIMUS LEVEL  TROPONIN I (HIGH SENSITIVITY)   ____________________________________________  12 Lead EKG  Sinus rhythm, rate of 89 bpm.  Normal axis and normal intervals.  Anterior Q waves and no evidence of acute ischemia. ____________________________________________  RADIOLOGY  ED MD interpretation: 2 view CXR with small bilateral pleural effusions  Official radiology report(s): DG Chest 2 View  Result Date: 08/19/2020 CLINICAL DATA:  Chest pain, nonradiating EXAM: CHEST - 2 VIEW COMPARISON:  12/10/2019 FINDINGS: Normal heart size and mediastinal contours. Streaky density in the bilateral lungs attributed  atelectasis or scarring. Small bilateral pleural effusion and fissure thickening. Dialysis catheter with tip at the upper right atrium. Postoperative left humerus. IMPRESSION: 1. Small pleural effusions and mild vascular congestion. 2. Mild atelectasis or scarring Electronically Signed   By: Monte Fantasia M.D.   On: 08/19/2020 07:31    ____________________________________________   PROCEDURES and INTERVENTIONS  Procedure(s) performed (including Critical Care):  .1-3 Lead EKG Interpretation Performed by: Vladimir Crofts, MD Authorized by: Vladimir Crofts, MD     Interpretation: normal     ECG rate:  70   ECG rate assessment: normal     Rhythm: sinus rhythm     Ectopy: none     Conduction: normal      Medications  nitroGLYCERIN (NITROSTAT) SL tablet 0.4 mg (  Sublingual MAR Hold 08/19/20 1315)  aspirin chewable tablet 81 mg ( Per Tube Automatically Held 08/27/20 1000)  metoprolol tartrate (LOPRESSOR) tablet 100 mg ( Per Tube MAR Hold 08/19/20 1315)  traZODone (DESYREL) tablet 75 mg ( Per Tube Automatically Held 08/27/20 2200)  pantoprazole (PROTONIX) EC tablet 40 mg ( Oral Automatically Held 08/27/20 2200)  polyethylene glycol (MIRALAX / GLYCOLAX) packet 17 g ( Per Tube Automatically Held 08/27/20 2200)  senna-docusate (Senokot-S) tablet 2 tablet ( Per Tube Automatically Held 08/27/20 2200)  fluticasone (FLONASE) 50 MCG/ACT nasal spray 2 spray ( Each Nare Automatically Held 08/27/20 1000)  montelukast (SINGULAIR) tablet 10 mg ( Per Tube Automatically Held 08/27/20 1000)  hydrocortisone 2.5 % lotion ( Topical Automatically Held 08/27/20 0900)  acetaminophen (TYLENOL) tablet 650 mg ( Oral MAR Hold 08/19/20 1315)  ondansetron (ZOFRAN) injection 4 mg ( Intravenous MAR Hold 08/19/20 1315)  heparin injection 5,000 Units ( Subcutaneous Automatically Held 08/27/20 2200)  0.9% sodium chloride infusion (has no administration in time range)    Followed by  0.9% sodium chloride infusion (has no administration  in time range)  sodium chloride flush (NS) 0.9 % injection 3 mL ( Intravenous Automatically Held 08/27/20 2200)  sodium chloride flush (NS) 0.9 % injection 3 mL (has no administration in time range)  0.9 %  sodium chloride infusion (has no administration in time range)  aspirin chewable tablet 81 mg (has no administration in time range)  venlafaxine (EFFEXOR) tablet 75 mg ( Per Tube Automatically Held 08/27/20 1700)  buPROPion (WELLBUTRIN) tablet 75 mg ( Per Tube Automatically Held 08/27/20 2200)  ezetimibe (ZETIA) tablet 10 mg ( Per Tube Automatically Held 08/27/20 2200)    And  simvastatin (ZOCOR) tablet 80 mg ( Per Tube Automatically Held 08/27/20 2200)  sulfamethoxazole-trimethoprim (BACTRIM DS) 800-160 MG per tablet 1 tablet ( Oral Automatically Held 08/27/20 1000)  valGANciclovir (VALCYTE) 450 MG tablet TABS 450 mg ( Oral Automatically Held 08/26/20 0900)  escitalopram (LEXAPRO) tablet 20 mg ( Oral Automatically Held 08/28/20 1000)  hydrOXYzine (ATARAX/VISTARIL) tablet 25 mg ( Oral MAR Hold 08/19/20 1315)  mirtazapine (REMERON) tablet 15 mg ( Oral Automatically Held 08/27/20 2200)  predniSONE (DELTASONE) tablet 30 mg ( Oral Automatically Held 08/28/20 1000)  insulin regular (NOVOLIN R) 100 units/mL injection 0-11 Units ( Subcutaneous MAR Hold 08/19/20 1315)  metoCLOPramide (REGLAN) tablet 5 mg ( Oral Automatically Held 08/27/20 1700)  melatonin tablet 10 mg ( Oral Automatically Held 08/27/20 2200)  mycophenolate (CELLCEPT) capsule 1,000 mg ( Oral Automatically Held 08/27/20 1800)  tacrolimus (PROGRAF) capsule 7-8 mg ( Oral MAR Hold 08/19/20 1315)  gabapentin (NEURONTIN) capsule 100 mg ( Oral Automatically Held 08/27/20 2200)    ____________________________________________   MDM / ED COURSE   65 year old male with complex medical history, including CAD s/p remote stenting, presents to the ED with positional chest pain and rising troponins requiring observation admission.  Normal vitals on room air.   Exam reassuring without evidence of distress, neurologic or vascular deficits.  Looks well.  Blood work with CKD without evidence of need for emergent or urgent dialysis.  EKG without ischemic features, but his for troponin is moderately elevated, and second troponin returns about 50% higher.  Considering his history, high risk and rising troponins with chest pain resolved with nitroglycerin, we will admit to hospitalist for observation.  Clinical Course as of 08/19/20 1329  Thu Aug 19, 2020  0904 Reevaluated the patient.  Daughter at the bedside.  He reports having 2/10 chest discomfort.  We discussed empiric nitroglycerin, we discussed his slightly elevated first troponin.  We discussed possible medical admission and he is agreeable. [DS]  8350 Reassessed.  Patient reports resolution of discomfort after nitroglycerin.  Daughter requests tacrolimus level, the direction of his transplant nurse, who she spoke with regarding this ED visit and possible admission. [DS]  1013 Second troponin rising.  Hospitalist paged. [DS]  1024 Updated patient of this.  He reports he is chest pain-free. [DS]    Clinical Course User Index [DS] Vladimir Crofts, MD    ____________________________________________   FINAL CLINICAL IMPRESSION(S) / ED DIAGNOSES  Final diagnoses:  Other chest pain     ED Discharge Orders    None       Keevan Wolz Tamala Julian   Note:  This document was prepared using Dragon voice recognition software and may include unintentional dictation errors.   Vladimir Crofts, MD 08/19/20 1331

## 2020-08-20 ENCOUNTER — Encounter: Payer: Self-pay | Admitting: Internal Medicine

## 2020-08-20 DIAGNOSIS — D638 Anemia in other chronic diseases classified elsewhere: Secondary | ICD-10-CM

## 2020-08-20 DIAGNOSIS — E1140 Type 2 diabetes mellitus with diabetic neuropathy, unspecified: Secondary | ICD-10-CM

## 2020-08-20 DIAGNOSIS — I249 Acute ischemic heart disease, unspecified: Secondary | ICD-10-CM | POA: Diagnosis not present

## 2020-08-20 DIAGNOSIS — N186 End stage renal disease: Secondary | ICD-10-CM | POA: Diagnosis not present

## 2020-08-20 DIAGNOSIS — Z794 Long term (current) use of insulin: Secondary | ICD-10-CM

## 2020-08-20 DIAGNOSIS — Z944 Liver transplant status: Secondary | ICD-10-CM | POA: Diagnosis not present

## 2020-08-20 DIAGNOSIS — I2510 Atherosclerotic heart disease of native coronary artery without angina pectoris: Secondary | ICD-10-CM | POA: Diagnosis not present

## 2020-08-20 LAB — BASIC METABOLIC PANEL
Anion gap: 8 (ref 5–15)
BUN: 53 mg/dL — ABNORMAL HIGH (ref 8–23)
CO2: 24 mmol/L (ref 22–32)
Calcium: 8.7 mg/dL — ABNORMAL LOW (ref 8.9–10.3)
Chloride: 100 mmol/L (ref 98–111)
Creatinine, Ser: 2.65 mg/dL — ABNORMAL HIGH (ref 0.61–1.24)
GFR, Estimated: 26 mL/min — ABNORMAL LOW (ref >60)
Glucose, Bld: 106 mg/dL — ABNORMAL HIGH (ref 70–99)
Potassium: 4.8 mmol/L (ref 3.5–5.1)
Sodium: 132 mmol/L — ABNORMAL LOW (ref 135–145)

## 2020-08-20 LAB — GLUCOSE, CAPILLARY: Glucose-Capillary: 102 mg/dL — ABNORMAL HIGH (ref 70–99)

## 2020-08-20 MED ORDER — CLOPIDOGREL BISULFATE 75 MG PO TABS: 75.0000 mg | ORAL_TABLET | Freq: Every day | ORAL | 0 refills | Status: AC

## 2020-08-20 MED ORDER — NITROGLYCERIN 0.4 MG SL SUBL: 0.4000 mg | SUBLINGUAL_TABLET | SUBLINGUAL | 0 refills | Status: AC | PRN

## 2020-08-20 MED ORDER — CLOPIDOGREL BISULFATE 75 MG PO TABS: 75.0000 mg | ORAL_TABLET | Freq: Every day | ORAL | Status: DC

## 2020-08-20 MED ORDER — METOPROLOL TARTRATE 25 MG PO TABS: 25.0000 mg | ORAL_TABLET | Freq: Two times a day (BID) | ORAL | Status: DC

## 2020-08-20 MED ORDER — METOPROLOL TARTRATE 25 MG PO TABS: 25.0000 mg | ORAL_TABLET | Freq: Two times a day (BID) | ORAL | 0 refills | Status: DC

## 2020-08-20 MED ORDER — PANTOPRAZOLE SODIUM 40 MG PO TBEC: 40.0000 mg | DELAYED_RELEASE_TABLET | Freq: Two times a day (BID) | ORAL | 0 refills | Status: DC

## 2020-08-20 MED ORDER — ATORVASTATIN CALCIUM 40 MG PO TABS: 40.0000 mg | ORAL_TABLET | Freq: Every evening | ORAL | 0 refills | Status: DC

## 2020-08-20 NOTE — Progress Notes (Signed)
[facility]  ROUNDING NOTE   Subjective:   Alex Hampton is a 65 y.o. male who presented to ED for chest pain.   Patient seen today resting in bed Alert and oriented Per nursing note and confirmed by patient, refused tube feedings overnight stating he will resume these once discharged Denies nausea and diarrhea Denies shortness of breath  Objective:  Vital signs in last 24 hours:  Temp:  [97.8 F (36.6 C)-98.9 F (37.2 C)] 97.9 F (36.6 C) (05/13 0812) Pulse Rate:  [72-91] 91 (05/13 0812) Resp:  [13-21] 16 (05/13 0812) BP: (107-159)/(71-99) 139/91 (05/13 0812) SpO2:  [98 %-100 %] 100 % (05/13 0812) Weight:  [77.1 kg-79.9 kg] 79.9 kg (05/13 0503)  Weight change: -2.586 kg Filed Weights   08/19/20 0639 08/19/20 1350 08/20/20 0503  Weight: 79.7 kg 77.1 kg 79.9 kg    Intake/Output: I/O last 3 completed shifts: In: 427 [P.O.:480; I.V.:463] Out: 1425 [Urine:1425]   Intake/Output this shift:  Total I/O In: 3 [I.V.:3] Out: -   Physical Exam: General: NAD, resting in bed  Head: Normocephalic, atraumatic. Moist oral mucosal membranes  Eyes: Anicteric  Lungs:  Clear to auscultation  Heart: Regular rate and rhythm  Abdomen:  Soft, nontender,   Extremities:  no peripheral edema.  Neurologic: Nonfocal, moving all four extremities  Skin: No lesions  Access: RIJ Permcath    Basic Metabolic Panel: Recent Labs  Lab 08/19/20 0643 08/20/20 0737  NA 132* 132*  K 3.9 4.8  CL 96* 100  CO2 26 24  GLUCOSE 150* 106*  BUN 47* 53*  CREATININE 2.59* 2.65*  CALCIUM 8.4* 8.7*    Liver Function Tests: Recent Labs  Lab 08/19/20 0643  AST 17  ALT 15  ALKPHOS 121  BILITOT 0.4  PROT 5.5*  ALBUMIN 2.8*   No results for input(s): LIPASE, AMYLASE in the last 168 hours. No results for input(s): AMMONIA in the last 168 hours.  CBC: Recent Labs  Lab 08/19/20 0643  WBC 3.0*  HGB 7.5*  HCT 22.8*  MCV 99.6  PLT 134*    Cardiac Enzymes: No results for  input(s): CKTOTAL, CKMB, CKMBINDEX, TROPONINI in the last 168 hours.  BNP: Invalid input(s): POCBNP  CBG: Recent Labs  Lab 08/20/20 0654  GLUCAP 102*    Microbiology: Results for orders placed or performed during the hospital encounter of 08/19/20  Resp Panel by RT-PCR (Flu A&B, Covid) Nasopharyngeal Swab     Status: None   Collection Time: 08/19/20  7:55 AM   Specimen: Nasopharyngeal Swab; Nasopharyngeal(NP) swabs in vial transport medium  Result Value Ref Range Status   SARS Coronavirus 2 by RT PCR NEGATIVE NEGATIVE Final    Comment: (NOTE) SARS-CoV-2 target nucleic acids are NOT DETECTED.  The SARS-CoV-2 RNA is generally detectable in upper respiratory specimens during the acute phase of infection. The lowest concentration of SARS-CoV-2 viral copies this assay can detect is 138 copies/mL. A negative result does not preclude SARS-Cov-2 infection and should not be used as the sole basis for treatment or other patient management decisions. A negative result may occur with  improper specimen collection/handling, submission of specimen other than nasopharyngeal swab, presence of viral mutation(s) within the areas targeted by this assay, and inadequate number of viral copies(<138 copies/mL). A negative result must be combined with clinical observations, patient history, and epidemiological information. The expected result is Negative.  Fact Sheet for Patients:  EntrepreneurPulse.com.au  Fact Sheet for Healthcare Providers:  IncredibleEmployment.be  This test is no  t yet approved or cleared by the [place] and  has been authorized for detection and/or diagnosis of SARS-CoV-2 by FDA under an Emergency Use Authorization (EUA). This EUA will remain  in effect (meaning this test can be used) for the duration of the COVID-19 declaration under Section 564(b)(1) of the Act, 21 U.S.C.section 360bbb-3(b)(1), unless the authorization is  terminated  or revoked sooner.       Influenza A by PCR NEGATIVE NEGATIVE Final   Influenza B by PCR NEGATIVE NEGATIVE Final    Comment: (NOTE) The Xpert Xpress SARS-CoV-2/FLU/RSV plus assay is intended as an aid in the diagnosis of influenza from Nasopharyngeal swab specimens and should not be used as a sole basis for treatment. Nasal washings and aspirates are unacceptable for Xpert Xpress SARS-CoV-2/FLU/RSV testing.  Fact Sheet for Patients: EntrepreneurPulse.com.au  Fact Sheet for Healthcare Providers: IncredibleEmployment.be  This test is not yet approved or cleared by the [place] FDA and has been authorized for detection and/or diagnosis of SARS-CoV-2 by FDA under an Emergency Use Authorization (EUA). This EUA will remain in effect (meaning this test can be used) for the duration of the COVID-19 declaration under Section 564(b)(1) of the Act, 21 U.S.C. section 360bbb-3(b)(1), unless the authorization is terminated or revoked.  Performed at [facility], [address] [place], [place] [address]     Coagulation Studies: No results for input(s): LABPROT, INR in the last 72 hours.  Urinalysis: No results for input(s): COLORURINE, LABSPEC, PHURINE, GLUCOSEU, HGBUR, BILIRUBINUR, KETONESUR, PROTEINUR, UROBILINOGEN, NITRITE, LEUKOCYTESUR in the last 72 hours.  Invalid input(s): APPERANCEUR    Imaging: DG Chest 2 View  Result Date: 08/19/2020 CLINICAL DATA:  Chest pain, nonradiating EXAM: CHEST - 2 VIEW COMPARISON:  12/10/2019 FINDINGS: Normal heart size and mediastinal contours. Streaky density in the bilateral lungs attributed atelectasis or scarring. Small bilateral pleural effusion and fissure thickening. Dialysis catheter with tip at the upper right atrium. Postoperative left humerus. IMPRESSION: 1. Small pleural effusions and mild vascular congestion. 2. Mild atelectasis or scarring Electronically Signed   By:  Monte Fantasia M.D.   On: 08/19/2020 07:31   CARDIAC CATHETERIZATION  Result Date: 08/19/2020  Dist RCA lesion is 99% stenosed with 99% stenosed side branch in RPDA.  Prox RCA lesion is 40% stenosed.  Mid RCA lesion is 30% stenosed.  1st Mrg lesion is 60% stenosed.  Prox LAD lesion is 35% stenosed.  Mid LAD lesion is 25% stenosed.  Previously placed Ost Cx to Prox Cx stent (unknown type) is widely patent.  65 year old male with known hypertension hyperlipidemia coronary artery disease status post previous stent placement with chronic kidney disease stage V and liver transplant with a non-ST elevation myocardial infarction Left ventricle with normal LV systolic function with ejection fraction of 60% Significant distal PDA stenosis of 99% with collateral blood flow to distal PDA from left to right Patent stent of proximal left circumflex artery 60% stenosis of proximal ramus artery Mild atherosclerosis of left anterior descending artery Plan No further cardiac intervention at this time due to distal small vessel coronary artery disease with collateral flow likely culprit of non-ST elevation myocardial infarction High intensity cholesterol therapy Dual antiplatelet therapy Cardiac rehabilitation Medication management for myocardial infarction and cardiovascular disease including high intensity cholesterol therapy and beta-blocker     Medications:    . aspirin  81 mg Per Tube Daily  . escitalopram  20 mg Oral Daily  . fluticasone  2 spray Each Nare Daily  .  gabapentin  100 mg Oral QHS  . heparin  5,000 Units Subcutaneous Q8H  . hydrocortisone   Topical Once per day on Mon Wed Fri  . insulin aspart  0-11 Units Subcutaneous TID WC  . melatonin  10 mg Oral QHS  . metoCLOPramide  5 mg Oral TID AC  . metoprolol tartrate  25 mg Oral BID  . mirtazapine  15 mg Oral QHS  . mycophenolate  1,000 mg Oral BID  . pantoprazole  40 mg Oral BID  . polyethylene glycol  17 g Oral BID  . predniSONE  30 mg  Oral Daily  . senna-docusate  2 tablet Oral BID  . sodium chloride flush  3 mL Intravenous Q12H  . sulfamethoxazole-trimethoprim  1 tablet Oral Q M,W,F  . tacrolimus  7 mg Oral Daily   And  . tacrolimus  8 mg Oral QHS  . traZODone  75 mg Per Tube QHS  . [START ON 08/23/2020] valGANciclovir  450 mg Oral Once per day on Mon Thu   acetaminophen, acetaminophen, hydrOXYzine, metoprolol tartrate, nitroGLYCERIN, ondansetron (ZOFRAN) IV, ondansetron (ZOFRAN) IV  Assessment/ Plan:  Mr. PASCO MARCHITTO is a 65 y.o.  male   1. Acute Kidney Injury requiring outpatient dialysis.  Last treatment on 08/17/20 No acute need for dialysis  Renal function improving IVF for proposed IV contrast exposure  Lab Results  Component Value Date   CREATININE 2.65 (H) 08/20/2020   CREATININE 2.59 (H) 08/19/2020   CREATININE 0.74 12/10/2019    Intake/Output Summary (Last 24 hours) at 08/20/2020 1114 Last data filed at 08/20/2020 0941 Gross per 24 hour  Intake 706 ml  Output 1275 ml  Net -569 ml   2. Liver Transplantation on 02/18/20 - Continue immunosuppression regimen of Tacrolimus 7mg , mycophenolate mofetil 100mg  BID, and prednisone 5mg  daily - Also continue antiviral valganciclovir and antibiotic TMP/SMX  3. Hypertension 139/91 Started on Metoprolol 25mg  BID   LOS: 0 Jamilee Lafosse 5/13/202211:14 AM

## 2020-08-20 NOTE — Progress Notes (Signed)
Alex Hampton to be D/C'd Home per MD order.  Discussed prescriptions and follow up appointments with the patient and daughter. Prescriptions were sent to patient's pharmacy, medication list explained in detail. Pt verbalized understanding.  Allergies as of 08/20/2020   No Known Allergies     Medication List    TAKE these medications   aspirin 81 MG EC tablet Take 81 mg by mouth daily.   atorvastatin 40 MG tablet Commonly known as: Lipitor Take 1 tablet (40 mg total) by mouth every evening.   clopidogrel 75 MG tablet Commonly known as: PLAVIX Take 1 tablet (75 mg total) by mouth daily.   escitalopram 20 MG tablet Commonly known as: LEXAPRO Take 20 mg by mouth daily.   gabapentin 100 MG capsule Commonly known as: NEURONTIN Take 100 mg by mouth 2 (two) times daily.   hydrOXYzine 25 MG capsule Commonly known as: VISTARIL Take 25 mg by mouth every 6 (six) hours as needed for anxiety or itching.   melatonin 3 MG Tabs tablet Take 9 mg by mouth at bedtime.   metoCLOPramide 5 MG tablet Commonly known as: REGLAN Take 5 mg by mouth in the morning, at noon, and at bedtime.   metoprolol tartrate 25 MG tablet Commonly known as: LOPRESSOR Take 1 tablet (25 mg total) by mouth 2 (two) times daily.   mirtazapine 15 MG tablet Commonly known as: REMERON Take 15 mg by mouth at bedtime.   mycophenolate 250 MG capsule Commonly known as: CELLCEPT Take 1,000 mg by mouth 2 (two) times daily.   nitroGLYCERIN 0.4 MG SL tablet Commonly known as: NITROSTAT Place 1 tablet (0.4 mg total) under the tongue every 5 (five) minutes as needed for chest pain.   NovoLIN R 100 units/mL injection Generic drug: insulin regular Inject 0-11 Units into the skin See admin instructions. Take 6 units at 6pm plus correction with meal time. Correction scale with meals.  Glucose Range  <200 none, 201 - 250 mg/dL 1 unit, 251 - 300 mg/dL 2 units, 301 - 350 mg/dL 3 units, 351- 400 mg/dl 4 units , >400 mg/dL take 5  units and call your provider   pantoprazole 40 MG tablet Commonly known as: PROTONIX Take 1 tablet (40 mg total) by mouth 2 (two) times daily. What changed: when to take this   predniSONE 5 MG tablet Commonly known as: DELTASONE Take 30 mg by mouth daily.   senna-docusate 8.6-50 MG tablet Commonly known as: Senokot-S Take 2 tablets by mouth 2 (two) times daily. What changed:   when to take this  reasons to take this   sulfamethoxazole-trimethoprim 800-160 MG tablet Commonly known as: BACTRIM DS Take 1 tablet by mouth every Monday, Wednesday, and Friday.   tacrolimus 1 MG capsule Commonly known as: PROGRAF Take 7-8 mg by mouth See admin instructions. Take 7 capsules (7mg ) by mouth every morning and take 8 capsules (8mg ) by mouth every evening   traZODone 150 MG tablet Commonly known as: DESYREL Take 150 mg by mouth at bedtime as needed for sleep.   valGANciclovir 450 MG tablet Commonly known as: VALCYTE Take 450 mg by mouth 2 (two) times a week. (Monday and Thursday) with largest meal of the day.       Vitals:   08/20/20 0503 08/20/20 0812  BP: (!) 159/89 (!) 139/91  Pulse: 87 91  Resp: 18 16  Temp: 98.1 F (36.7 C) 97.9 F (36.6 C)  SpO2: 100% 100%    Tele box removed and returned.Skin  clean, dry and intact without evidence of skin break down, no evidence of skin tears noted. IV catheter discontinued intact. Site without signs and symptoms of complications. Dressing and pressure applied. Pt denies pain at this time. No complaints noted.  An After Visit Summary was printed and given to the patient. Patient escorted via [place], and D/C home via private auto.  Rolley Sims

## 2020-08-20 NOTE — Progress Notes (Signed)
Hemodialysis patient known at [facility] 6:30, patient stated that son or daughter transports to treatments. Dr. Juleen China requested that clinic be aware of patient's renal function and to do a 24 hour urine catch. Clinic is aware. Patient informed that clinic will go over instructions tomorrow, next treatment.

## 2020-08-20 NOTE — Discharge Summary (Signed)
[facility] [place] NAME: Alex Hampton    MR#:  [id number]  DATE OF BIRTH:  Jun 16, 1955  DATE OF ADMISSION:  08/19/2020 ADMITTING PHYSICIAN: Collier Bullock, MD  DATE OF DISCHARGE: 08/20/2020 11:38 AM  PRIMARY CARE PHYSICIAN: Juluis Pitch, MD    ADMISSION DIAGNOSIS:  Chest pain [R07.9]  DISCHARGE DIAGNOSIS:  Principal Problem:   Chest pain Active Problems:   HTN (hypertension)   History of MI (myocardial infarction)   Anxiety and depression   ESRD (end stage renal disease) (HCC)   History of anemia due to chronic kidney disease   SECONDARY DIAGNOSIS:   Past Medical History:  Diagnosis Date  . Depression   . History of cardiac cath   . History of heart artery stent   . Hyperlipidemia   . Hypertension   . MI (myocardial infarction) ([place])   . Renal disorder     HOSPITAL COURSE:   1.  NSTEMI.  Patient was brought to the cardiac Cath Lab by Dr. Nehemiah Massed.  Patient had a distal RCA lesion 99% stenosed and a 99% stenosis of the side branch of the RPDA.  Both of which too small for a stent.  Medical management with aspirin, Plavix, Lipitor and metoprolol.  I did discuss case with the patient's liver team Vevelyn Pat and these medications are okay.  Cardiac rehab.  Follow-up Dr. Nehemiah Massed 1 week. 2.  End-stage renal disease.  Case discussed with nephrology and they are okay with him following up as outpatient for dialysis tomorrow.  I stressed the importance of dialysis to remove the cardiac cath dye. 3.  Liver transplant patient.  Continue Prograf, CellCept and prednisone.  Follow-up with transplant team as outpatient. 4.  Anemia of chronic disease.  Follow-up as outpatient.  Hemoglobin 7.5.  Looking in care everywhere.  Hemoglobin has been variable as outpatient and I did see 1 of 7.5 on 08/11/2020.  Recent ferritin of 813.  Continue Procrit with dialysis. 5.  History of oropharyngeal dysphagia.  Patient has PEG tube in for extra  nutrition.  We will go back on his Novasource renal at 75 cc an hour for 12 hours as outpatient. 6.  Type 2 diabetes mellitus with neuropathy on gabapentin and sliding scale insulin 7.  Anxiety depression.  Continue same psychiatric medications 8.  Increase PPI to twice daily dosing   DISCHARGE CONDITIONS:   Satisfactory  CONSULTS OBTAINED:  Cardiology Nephrology  DRUG ALLERGIES:  No Known Allergies  DISCHARGE MEDICATIONS:   Allergies as of 08/20/2020   No Known Allergies     Medication List    TAKE these medications   aspirin 81 MG EC tablet Take 81 mg by mouth daily.   atorvastatin 40 MG tablet Commonly known as: Lipitor Take 1 tablet (40 mg total) by mouth every evening.   clopidogrel 75 MG tablet Commonly known as: PLAVIX Take 1 tablet (75 mg total) by mouth daily.   escitalopram 20 MG tablet Commonly known as: LEXAPRO Take 20 mg by mouth daily.   gabapentin 100 MG capsule Commonly known as: NEURONTIN Take 100 mg by mouth 2 (two) times daily.   hydrOXYzine 25 MG capsule Commonly known as: VISTARIL Take 25 mg by mouth every 6 (six) hours as needed for anxiety or itching.   melatonin 3 MG Tabs tablet Take 9 mg by mouth at bedtime.   metoCLOPramide 5 MG tablet Commonly known as: REGLAN Take 5 mg by mouth in the morning, at noon, and  at bedtime.   metoprolol tartrate 25 MG tablet Commonly known as: LOPRESSOR Take 1 tablet (25 mg total) by mouth 2 (two) times daily.   mirtazapine 15 MG tablet Commonly known as: REMERON Take 15 mg by mouth at bedtime.   mycophenolate 250 MG capsule Commonly known as: CELLCEPT Take 1,000 mg by mouth 2 (two) times daily.   nitroGLYCERIN 0.4 MG SL tablet Commonly known as: NITROSTAT Place 1 tablet (0.4 mg total) under the tongue every 5 (five) minutes as needed for chest pain.   NovoLIN R 100 units/mL injection Generic drug: insulin regular Inject 0-11 Units into the skin See admin instructions. Take 6 units at 6pm  plus correction with meal time. Correction scale with meals.  Glucose Range  <200 none, 201 - 250 mg/dL 1 unit, 251 - 300 mg/dL 2 units, 301 - 350 mg/dL 3 units, 351- 400 mg/dl 4 units , >400 mg/dL take 5 units and call your provider   pantoprazole 40 MG tablet Commonly known as: PROTONIX Take 1 tablet (40 mg total) by mouth 2 (two) times daily. What changed: when to take this   predniSONE 5 MG tablet Commonly known as: DELTASONE Take 30 mg by mouth daily.   senna-docusate 8.6-50 MG tablet Commonly known as: Senokot-S Take 2 tablets by mouth 2 (two) times daily. What changed:   when to take this  reasons to take this   sulfamethoxazole-trimethoprim 800-160 MG tablet Commonly known as: BACTRIM DS Take 1 tablet by mouth every Monday, Wednesday, and Friday.   tacrolimus 1 MG capsule Commonly known as: PROGRAF Take 7-8 mg by mouth See admin instructions. Take 7 capsules (7mg ) by mouth every morning and take 8 capsules (8mg ) by mouth every evening   traZODone 150 MG tablet Commonly known as: DESYREL Take 150 mg by mouth at bedtime as needed for sleep.   valGANciclovir 450 MG tablet Commonly known as: VALCYTE Take 450 mg by mouth 2 (two) times a week. (Monday and Thursday) with largest meal of the day.        DISCHARGE INSTRUCTIONS:   Follow-up PMD 5 days Follow-up with dialysis Follow-up with transplant team  If you experience worsening of your admission symptoms, develop shortness of breath, life threatening emergency, suicidal or homicidal thoughts you must seek medical attention immediately by calling 911 or calling your MD immediately  if symptoms less severe.  You Must read complete instructions/literature along with all the possible adverse reactions/side effects for all the Medicines you take and that have been prescribed to you. Take any new Medicines after you have completely understood and accept all the possible adverse reactions/side effects.   Please  note  You were cared for by a hospitalist during your hospital stay. If you have any questions about your discharge medications or the care you received while you were in the hospital after you are discharged, you can call the unit and asked to speak with the hospitalist on call if the hospitalist that took care of you is not available. Once you are discharged, your primary care physician will handle any further medical issues. Please note that NO REFILLS for any discharge medications will be authorized once you are discharged, as it is imperative that you return to your primary care physician (or establish a relationship with a primary care physician if you do not have one) for your aftercare needs so that they can reassess your need for medications and monitor your lab values.    Today   CHIEF COMPLAINT:  Chief Complaint  Patient presents with  . Chest Pain    HISTORY OF PRESENT ILLNESS:  Karlton Maya  is a 65 y.o. male came in with chest pain   VITAL SIGNS:  Blood pressure (!) 139/91, pulse 91, temperature 97.9 F (36.6 C), resp. rate 16, height 6\' 1"  (1.854 m), weight 79.9 kg, SpO2 100 %.  I/O:    Intake/Output Summary (Last 24 hours) at 08/20/2020 1536 Last data filed at 08/20/2020 0941 Gross per 24 hour  Intake 466 ml  Output 575 ml  Net -109 ml    PHYSICAL EXAMINATION:  GENERAL:  65 y.o.-year-old patient lying in the bed with no acute distress.  EYES: Pupils equal, round, reactive to light and accommodation. No scleral icterus. Extraocular muscles intact.  HEENT: Head atraumatic, normocephalic. Oropharynx and nasopharynx clear.  LUNGS: Normal breath sounds bilaterally, no wheezing, rales,rhonchi or crepitation. No use of accessory muscles of respiration.  CARDIOVASCULAR: S1, S2 normal. No murmurs, rubs, or gallops.  ABDOMEN: Soft, non-tender, non-distended.  EXTREMITIES: No pedal edema.  NEUROLOGIC: Cranial nerves II through XII are intact. Muscle strength 5/5 in all  extremities. Sensation intact. Gait not checked.  PSYCHIATRIC: The patient is alert and oriented x 3.  SKIN: No obvious rash, lesion, or ulcer.   DATA REVIEW:   CBC Recent Labs  Lab 08/19/20 0643  WBC 3.0*  HGB 7.5*  HCT 22.8*  PLT 134*    Chemistries  Recent Labs  Lab 08/19/20 0643 08/20/20 0737  NA 132* 132*  K 3.9 4.8  CL 96* 100  CO2 26 24  GLUCOSE 150* 106*  BUN 47* 53*  CREATININE 2.59* 2.65*  CALCIUM 8.4* 8.7*  AST 17  --   ALT 15  --   ALKPHOS 121  --   BILITOT 0.4  --     Microbiology Results  Results for orders placed or performed during the hospital encounter of 08/19/20  Resp Panel by RT-PCR (Flu A&B, Covid) Nasopharyngeal Swab     Status: None   Collection Time: 08/19/20  7:55 AM   Specimen: Nasopharyngeal Swab; Nasopharyngeal(NP) swabs in vial transport medium  Result Value Ref Range Status   SARS Coronavirus 2 by RT PCR NEGATIVE NEGATIVE Final    Comment: (NOTE) SARS-CoV-2 target nucleic acids are NOT DETECTED.  The SARS-CoV-2 RNA is generally detectable in upper respiratory specimens during the acute phase of infection. The lowest concentration of SARS-CoV-2 viral copies this assay can detect is 138 copies/mL. A negative result does not preclude SARS-Cov-2 infection and should not be used as the sole basis for treatment or other patient management decisions. A negative result may occur with  improper specimen collection/handling, submission of specimen other than nasopharyngeal swab, presence of viral mutation(s) within the areas targeted by this assay, and inadequate number of viral copies(<138 copies/mL). A negative result must be combined with clinical observations, patient history, and epidemiological information. The expected result is Negative.  Fact Sheet for Patients:  EntrepreneurPulse.com.au  Fact Sheet for Healthcare Providers:  IncredibleEmployment.be  This test is no t yet approved or  cleared by the [place] FDA and  has been authorized for detection and/or diagnosis of SARS-CoV-2 by FDA under an Emergency Use Authorization (EUA). This EUA will remain  in effect (meaning this test can be used) for the duration of the COVID-19 declaration under Section 564(b)(1) of the Act, 21 U.S.C.section 360bbb-3(b)(1), unless the authorization is terminated  or revoked sooner.       Influenza A by PCR  NEGATIVE NEGATIVE Final   Influenza B by PCR NEGATIVE NEGATIVE Final    Comment: (NOTE) The Xpert Xpress SARS-CoV-2/FLU/RSV plus assay is intended as an aid in the diagnosis of influenza from Nasopharyngeal swab specimens and should not be used as a sole basis for treatment. Nasal washings and aspirates are unacceptable for Xpert Xpress SARS-CoV-2/FLU/RSV testing.  Fact Sheet for Patients: EntrepreneurPulse.com.au  Fact Sheet for Healthcare Providers: IncredibleEmployment.be  This test is not yet approved or cleared by the [place] FDA and has been authorized for detection and/or diagnosis of SARS-CoV-2 by FDA under an Emergency Use Authorization (EUA). This EUA will remain in effect (meaning this test can be used) for the duration of the COVID-19 declaration under Section 564(b)(1) of the Act, 21 U.S.C. section 360bbb-3(b)(1), unless the authorization is terminated or revoked.  Performed at [facility], [address] [place], [place] [address]     RADIOLOGY:  DG Chest 2 View  Result Date: 08/19/2020 CLINICAL DATA:  Chest pain, nonradiating EXAM: CHEST - 2 VIEW COMPARISON:  12/10/2019 FINDINGS: Normal heart size and mediastinal contours. Streaky density in the bilateral lungs attributed atelectasis or scarring. Small bilateral pleural effusion and fissure thickening. Dialysis catheter with tip at the upper right atrium. Postoperative left humerus. IMPRESSION: 1. Small pleural effusions and mild vascular congestion.  2. Mild atelectasis or scarring Electronically Signed   By: Monte Fantasia M.D.   On: 08/19/2020 07:31   CARDIAC CATHETERIZATION  Result Date: 08/19/2020  Dist RCA lesion is 99% stenosed with 99% stenosed side branch in RPDA.  Prox RCA lesion is 40% stenosed.  Mid RCA lesion is 30% stenosed.  1st Mrg lesion is 60% stenosed.  Prox LAD lesion is 35% stenosed.  Mid LAD lesion is 25% stenosed.  Previously placed Ost Cx to Prox Cx stent (unknown type) is widely patent.  65 year old male with known hypertension hyperlipidemia coronary artery disease status post previous stent placement with chronic kidney disease stage V and liver transplant with a non-ST elevation myocardial infarction Left ventricle with normal LV systolic function with ejection fraction of 60% Significant distal PDA stenosis of 99% with collateral blood flow to distal PDA from left to right Patent stent of proximal left circumflex artery 60% stenosis of proximal ramus artery Mild atherosclerosis of left anterior descending artery Plan No further cardiac intervention at this time due to distal small vessel coronary artery disease with collateral flow likely culprit of non-ST elevation myocardial infarction High intensity cholesterol therapy Dual antiplatelet therapy Cardiac rehabilitation Medication management for myocardial infarction and cardiovascular disease including high intensity cholesterol therapy and beta-blocker    Management plans discussed with the patient, family and they are in agreement.  CODE STATUS:     Code Status Orders  (From admission, onward)         Start     Ordered   08/19/20 1032  Full code  Continuous        08/19/20 1032        Code Status History    Date Active Date Inactive Code Status Order ID Comments User Context   10/17/2019 0353 10/23/2019 1939 Full Code [id number]  Athena Masse, MD ED   Advance Care Planning Activity    Advance Directive Documentation   Flowsheet Row Most Recent  Value  Type of Advance Directive Healthcare Power of Attorney  Pre-existing out of facility DNR order (yellow form or pink MOST form) --  "MOST" Form in Place? --  TOTAL TIME TAKING CARE OF THIS PATIENT: 35 minutes.    Loletha Grayer M.D on 08/20/2020 at 3:36 PM  Between 7am to 6pm - Pager - [phone]  After 6pm go to [email or web address] - password [email or web address]  [facility]  CC: Primary care physician; Juluis Pitch, MD

## 2020-08-20 NOTE — Progress Notes (Signed)
Pt has deferred to start his tube feeding back tonight.  Pt states that he will start when he gets home.  Dr. Aileen Fass notified.

## 2020-08-23 LAB — TACROLIMUS LEVEL: Tacrolimus (FK506) - LabCorp: 4.4 ng/mL (ref 2.0–20.0)

## 2020-10-10 ENCOUNTER — Encounter: Payer: Self-pay | Admitting: Emergency Medicine

## 2020-10-10 ENCOUNTER — Other Ambulatory Visit: Payer: Self-pay

## 2020-10-10 ENCOUNTER — Emergency Department: Payer: Medicare Other

## 2020-10-10 ENCOUNTER — Inpatient Hospital Stay
Admission: EM | Admit: 2020-10-10 | Discharge: 2020-10-14 | DRG: 640 | Disposition: A | Discharge status: Home Health | Payer: Medicare Other | Attending: Hospitalist | Admitting: Hospitalist

## 2020-10-10 DIAGNOSIS — K9423 Gastrostomy malfunction: Secondary | ICD-10-CM | POA: Diagnosis not present

## 2020-10-10 DIAGNOSIS — N19 Unspecified kidney failure: Secondary | ICD-10-CM | POA: Diagnosis present

## 2020-10-10 DIAGNOSIS — Z955 Presence of coronary angioplasty implant and graft: Secondary | ICD-10-CM | POA: Diagnosis not present

## 2020-10-10 DIAGNOSIS — R443 Hallucinations, unspecified: Secondary | ICD-10-CM | POA: Diagnosis not present

## 2020-10-10 DIAGNOSIS — N186 End stage renal disease: Secondary | ICD-10-CM

## 2020-10-10 DIAGNOSIS — R197 Diarrhea, unspecified: Secondary | ICD-10-CM | POA: Diagnosis present

## 2020-10-10 DIAGNOSIS — G9341 Metabolic encephalopathy: Secondary | ICD-10-CM

## 2020-10-10 DIAGNOSIS — R531 Weakness: Secondary | ICD-10-CM | POA: Diagnosis not present

## 2020-10-10 DIAGNOSIS — N179 Acute kidney failure, unspecified: Secondary | ICD-10-CM | POA: Diagnosis present

## 2020-10-10 DIAGNOSIS — E1122 Type 2 diabetes mellitus with diabetic chronic kidney disease: Secondary | ICD-10-CM | POA: Diagnosis present

## 2020-10-10 DIAGNOSIS — E871 Hypo-osmolality and hyponatremia: Secondary | ICD-10-CM | POA: Diagnosis present

## 2020-10-10 DIAGNOSIS — D631 Anemia in chronic kidney disease: Secondary | ICD-10-CM

## 2020-10-10 DIAGNOSIS — Z833 Family history of diabetes mellitus: Secondary | ICD-10-CM

## 2020-10-10 DIAGNOSIS — I252 Old myocardial infarction: Secondary | ICD-10-CM | POA: Diagnosis not present

## 2020-10-10 DIAGNOSIS — F32A Depression, unspecified: Secondary | ICD-10-CM | POA: Diagnosis present

## 2020-10-10 DIAGNOSIS — E1129 Type 2 diabetes mellitus with other diabetic kidney complication: Secondary | ICD-10-CM | POA: Diagnosis present

## 2020-10-10 DIAGNOSIS — Z20822 Contact with and (suspected) exposure to covid-19: Secondary | ICD-10-CM | POA: Diagnosis present

## 2020-10-10 DIAGNOSIS — R778 Other specified abnormalities of plasma proteins: Secondary | ICD-10-CM | POA: Diagnosis present

## 2020-10-10 DIAGNOSIS — D849 Immunodeficiency, unspecified: Secondary | ICD-10-CM | POA: Diagnosis present

## 2020-10-10 DIAGNOSIS — Z87891 Personal history of nicotine dependence: Secondary | ICD-10-CM | POA: Diagnosis not present

## 2020-10-10 DIAGNOSIS — I10 Essential (primary) hypertension: Secondary | ICD-10-CM | POA: Diagnosis not present

## 2020-10-10 DIAGNOSIS — I120 Hypertensive chronic kidney disease with stage 5 chronic kidney disease or end stage renal disease: Secondary | ICD-10-CM | POA: Diagnosis present

## 2020-10-10 DIAGNOSIS — K219 Gastro-esophageal reflux disease without esophagitis: Secondary | ICD-10-CM | POA: Diagnosis present

## 2020-10-10 DIAGNOSIS — Z944 Liver transplant status: Secondary | ICD-10-CM | POA: Diagnosis not present

## 2020-10-10 DIAGNOSIS — T85528A Displacement of other gastrointestinal prosthetic devices, implants and grafts, initial encounter: Secondary | ICD-10-CM

## 2020-10-10 DIAGNOSIS — E875 Hyperkalemia: Principal | ICD-10-CM

## 2020-10-10 DIAGNOSIS — F419 Anxiety disorder, unspecified: Secondary | ICD-10-CM

## 2020-10-10 DIAGNOSIS — E785 Hyperlipidemia, unspecified: Secondary | ICD-10-CM | POA: Diagnosis present

## 2020-10-10 DIAGNOSIS — R799 Abnormal finding of blood chemistry, unspecified: Secondary | ICD-10-CM

## 2020-10-10 DIAGNOSIS — Y833 Surgical operation with formation of external stoma as the cause of abnormal reaction of the patient, or of later complication, without mention of misadventure at the time of the procedure: Secondary | ICD-10-CM | POA: Diagnosis not present

## 2020-10-10 DIAGNOSIS — R441 Visual hallucinations: Secondary | ICD-10-CM | POA: Diagnosis present

## 2020-10-10 DIAGNOSIS — I2510 Atherosclerotic heart disease of native coronary artery without angina pectoris: Secondary | ICD-10-CM | POA: Diagnosis present

## 2020-10-10 DIAGNOSIS — Z79899 Other long term (current) drug therapy: Secondary | ICD-10-CM

## 2020-10-10 DIAGNOSIS — Z931 Gastrostomy status: Secondary | ICD-10-CM

## 2020-10-10 DIAGNOSIS — G4733 Obstructive sleep apnea (adult) (pediatric): Secondary | ICD-10-CM | POA: Diagnosis present

## 2020-10-10 DIAGNOSIS — Z7982 Long term (current) use of aspirin: Secondary | ICD-10-CM

## 2020-10-10 DIAGNOSIS — Z7902 Long term (current) use of antithrombotics/antiplatelets: Secondary | ICD-10-CM

## 2020-10-10 DIAGNOSIS — R7989 Other specified abnormal findings of blood chemistry: Secondary | ICD-10-CM | POA: Diagnosis present

## 2020-10-10 DIAGNOSIS — Z794 Long term (current) use of insulin: Secondary | ICD-10-CM

## 2020-10-10 DIAGNOSIS — E119 Type 2 diabetes mellitus without complications: Secondary | ICD-10-CM | POA: Diagnosis present

## 2020-10-10 LAB — BASIC METABOLIC PANEL
Anion gap: 10 (ref 5–15)
BUN: 102 mg/dL — ABNORMAL HIGH (ref 8–23)
CO2: 23 mmol/L (ref 22–32)
Calcium: 10.1 mg/dL (ref 8.9–10.3)
Chloride: 98 mmol/L (ref 98–111)
Creatinine, Ser: 2.96 mg/dL — ABNORMAL HIGH (ref 0.61–1.24)
GFR, Estimated: 23 mL/min — ABNORMAL LOW (ref >60)
Glucose, Bld: 195 mg/dL — ABNORMAL HIGH (ref 70–99)
Potassium: 5.7 mmol/L — ABNORMAL HIGH (ref 3.5–5.1)
Sodium: 131 mmol/L — ABNORMAL LOW (ref 135–145)

## 2020-10-10 LAB — TROPONIN I (HIGH SENSITIVITY)
Troponin I (High Sensitivity): 28 ng/L — ABNORMAL HIGH (ref <18)
Troponin I (High Sensitivity): 29 ng/L — ABNORMAL HIGH (ref <18)

## 2020-10-10 LAB — BRAIN NATRIURETIC PEPTIDE: B Natriuretic Peptide: 157.8 pg/mL — ABNORMAL HIGH (ref 0.0–100.0)

## 2020-10-10 LAB — CBC
HCT: 26.6 % — ABNORMAL LOW (ref 39.0–52.0)
Hemoglobin: 9 g/dL — ABNORMAL LOW (ref 13.0–17.0)
MCH: 34.5 pg — ABNORMAL HIGH (ref 26.0–34.0)
MCHC: 33.8 g/dL (ref 30.0–36.0)
MCV: 101.9 fL — ABNORMAL HIGH (ref 80.0–100.0)
Platelets: 161 10*3/uL (ref 150–400)
RBC: 2.61 MIL/uL — ABNORMAL LOW (ref 4.22–5.81)
RDW: 14.2 % (ref 11.5–15.5)
WBC: 3.8 10*3/uL — ABNORMAL LOW (ref 4.0–10.5)
nRBC: 0 % (ref 0.0–0.2)

## 2020-10-10 LAB — HEPATIC FUNCTION PANEL
ALT: 13 U/L (ref 0–44)
AST: 18 U/L (ref 15–41)
Albumin: 4 g/dL (ref 3.5–5.0)
Alkaline Phosphatase: 92 U/L (ref 38–126)
Bilirubin, Direct: <0.1 mg/dL (ref 0.0–0.2)
Total Bilirubin: 0.5 mg/dL (ref 0.3–1.2)
Total Protein: 7.4 g/dL (ref 6.5–8.1)

## 2020-10-10 LAB — GLUCOSE, CAPILLARY: Glucose-Capillary: 231 mg/dL — ABNORMAL HIGH (ref 70–99)

## 2020-10-10 LAB — RESP PANEL BY RT-PCR (FLU A&B, COVID) ARPGX2
Influenza A by PCR: NEGATIVE
Influenza B by PCR: NEGATIVE
SARS Coronavirus 2 by RT PCR: NEGATIVE

## 2020-10-10 LAB — PROTIME-INR
INR: 1.1 (ref 0.8–1.2)
Prothrombin Time: 14 seconds (ref 11.4–15.2)

## 2020-10-10 LAB — AMMONIA: Ammonia: 9 umol/L (ref 9–35)

## 2020-10-10 MED ORDER — ALBUTEROL SULFATE (2.5 MG/3ML) 0.083% IN NEBU: 2.5000 mg | INHALATION_SOLUTION | RESPIRATORY_TRACT | Status: DC | PRN

## 2020-10-10 MED ORDER — INSULIN ASPART 100 UNIT/ML IJ SOLN
0.0000 [IU] | Freq: Every day | INTRAMUSCULAR | Status: DC
Start: 2020-10-11 — End: 2020-10-14
  Administered 2020-10-10: 2 [IU] via SUBCUTANEOUS
  Filled 2020-10-10: qty 1

## 2020-10-10 MED ORDER — TACROLIMUS 1 MG PO CAPS
7.0000 mg | ORAL_CAPSULE | Freq: Two times a day (BID) | ORAL | Status: DC
  Administered 2020-10-10 – 2020-10-13 (×7): 7 mg via ORAL
  Filled 2020-10-10 (×10): qty 7

## 2020-10-10 MED ORDER — HYDROCORTISONE NA SUCCINATE PF 100 MG IJ SOLR
100.0000 mg | Freq: Once | INTRAMUSCULAR | Status: AC
  Administered 2020-10-10: 100 mg via INTRAVENOUS
  Filled 2020-10-10: qty 2

## 2020-10-10 MED ORDER — MELATONIN 5 MG PO TABS
10.0000 mg | ORAL_TABLET | Freq: Every day | ORAL | Status: DC
  Administered 2020-10-10 – 2020-10-13 (×4): 10 mg via ORAL
  Filled 2020-10-10 (×4): qty 2

## 2020-10-10 MED ORDER — SENNOSIDES-DOCUSATE SODIUM 8.6-50 MG PO TABS
2.0000 | ORAL_TABLET | Freq: Two times a day (BID) | ORAL | Status: DC | PRN
  Administered 2020-10-14: 2 via ORAL
  Filled 2020-10-10: qty 2

## 2020-10-10 MED ORDER — PANTOPRAZOLE SODIUM 40 MG PO TBEC
40.0000 mg | DELAYED_RELEASE_TABLET | Freq: Every day | ORAL | Status: DC
  Administered 2020-10-11 – 2020-10-13 (×3): 40 mg via ORAL
  Filled 2020-10-10 (×3): qty 1

## 2020-10-10 MED ORDER — GABAPENTIN 100 MG PO CAPS
100.0000 mg | ORAL_CAPSULE | Freq: Two times a day (BID) | ORAL | Status: DC
  Administered 2020-10-10 – 2020-10-13 (×7): 100 mg via ORAL
  Filled 2020-10-10 (×7): qty 1

## 2020-10-10 MED ORDER — CLOPIDOGREL BISULFATE 75 MG PO TABS
75.0000 mg | ORAL_TABLET | Freq: Every day | ORAL | Status: DC
  Administered 2020-10-11 – 2020-10-13 (×3): 75 mg via ORAL
  Filled 2020-10-10 (×3): qty 1

## 2020-10-10 MED ORDER — METOPROLOL TARTRATE 25 MG PO TABS
25.0000 mg | ORAL_TABLET | Freq: Two times a day (BID) | ORAL | Status: DC
  Administered 2020-10-10 – 2020-10-13 (×7): 25 mg via ORAL
  Filled 2020-10-10 (×7): qty 1

## 2020-10-10 MED ORDER — NOVASOURCE RENAL PO LIQD
960.0000 mL | Freq: Every day | ORAL | Status: DC
  Filled 2020-10-10: qty 1

## 2020-10-10 MED ORDER — PREDNISONE 5 MG PO TABS
17.5000 mg | ORAL_TABLET | Freq: Every day | ORAL | Status: DC
  Administered 2020-10-11 – 2020-10-13 (×3): 17.5 mg via ORAL
  Filled 2020-10-10 (×5): qty 1

## 2020-10-10 MED ORDER — NITROGLYCERIN 0.4 MG SL SUBL: 0.4000 mg | SUBLINGUAL_TABLET | SUBLINGUAL | Status: DC | PRN

## 2020-10-10 MED ORDER — DEXTROSE 50 % IV SOLN
50.0000 mL | Freq: Once | INTRAVENOUS | Status: AC
  Administered 2020-10-10: 50 mL via INTRAVENOUS
  Filled 2020-10-10: qty 50

## 2020-10-10 MED ORDER — HEPARIN SODIUM (PORCINE) 5000 UNIT/ML IJ SOLN
5000.0000 [IU] | Freq: Three times a day (TID) | INTRAMUSCULAR | Status: DC
  Administered 2020-10-10 – 2020-10-14 (×10): 5000 [IU] via SUBCUTANEOUS
  Filled 2020-10-10 (×10): qty 1

## 2020-10-10 MED ORDER — SODIUM ZIRCONIUM CYCLOSILICATE 5 G PO PACK
5.0000 g | PACK | Freq: Once | ORAL | Status: AC
  Administered 2020-10-10: 5 g via ORAL
  Filled 2020-10-10: qty 1

## 2020-10-10 MED ORDER — ONDANSETRON HCL 4 MG/2ML IJ SOLN: 4.0000 mg | Freq: Three times a day (TID) | INTRAMUSCULAR | Status: DC | PRN

## 2020-10-10 MED ORDER — SULFAMETHOXAZOLE-TRIMETHOPRIM 800-160 MG PO TABS
1.0000 | ORAL_TABLET | ORAL | Status: DC
  Administered 2020-10-11: 1 via ORAL
  Filled 2020-10-10: qty 1

## 2020-10-10 MED ORDER — METOCLOPRAMIDE HCL 10 MG PO TABS
5.0000 mg | ORAL_TABLET | Freq: Three times a day (TID) | ORAL | Status: DC
  Administered 2020-10-11 – 2020-10-13 (×7): 5 mg via ORAL
  Filled 2020-10-10 (×7): qty 1

## 2020-10-10 MED ORDER — ATORVASTATIN CALCIUM 20 MG PO TABS
40.0000 mg | ORAL_TABLET | Freq: Every day | ORAL | Status: DC
  Administered 2020-10-10 – 2020-10-13 (×4): 40 mg via ORAL
  Filled 2020-10-10 (×4): qty 2

## 2020-10-10 MED ORDER — ASPIRIN EC 81 MG PO TBEC
81.0000 mg | DELAYED_RELEASE_TABLET | Freq: Every day | ORAL | Status: DC
  Administered 2020-10-11 – 2020-10-13 (×3): 81 mg via ORAL
  Filled 2020-10-10 (×3): qty 1

## 2020-10-10 MED ORDER — INSULIN ASPART 100 UNIT/ML IV SOLN
5.0000 [IU] | Freq: Once | INTRAVENOUS | Status: AC
  Administered 2020-10-10: 5 [IU] via INTRAVENOUS
  Filled 2020-10-10: qty 0.05

## 2020-10-10 MED ORDER — MYCOPHENOLATE MOFETIL 250 MG PO CAPS
1000.0000 mg | ORAL_CAPSULE | Freq: Two times a day (BID) | ORAL | Status: DC
  Administered 2020-10-10 – 2020-10-13 (×6): 1000 mg via ORAL
  Filled 2020-10-10 (×11): qty 4

## 2020-10-10 MED ORDER — HYDRALAZINE HCL 20 MG/ML IJ SOLN: 5.0000 mg | INTRAMUSCULAR | Status: DC | PRN

## 2020-10-10 MED ORDER — HYDROXYZINE PAMOATE 25 MG PO CAPS
25.0000 mg | ORAL_CAPSULE | Freq: Four times a day (QID) | ORAL | Status: DC | PRN
  Filled 2020-10-10: qty 1

## 2020-10-10 MED ORDER — MIRTAZAPINE 15 MG PO TABS
15.0000 mg | ORAL_TABLET | Freq: Every day | ORAL | Status: DC
  Administered 2020-10-10 – 2020-10-13 (×4): 15 mg via ORAL
  Filled 2020-10-10 (×4): qty 1

## 2020-10-10 MED ORDER — TRAZODONE HCL 50 MG PO TABS
150.0000 mg | ORAL_TABLET | Freq: Every day | ORAL | Status: DC
  Administered 2020-10-10 – 2020-10-13 (×4): 150 mg via ORAL
  Filled 2020-10-10 (×3): qty 1

## 2020-10-10 MED ORDER — VALGANCICLOVIR HCL 450 MG PO TABS
450.0000 mg | ORAL_TABLET | ORAL | Status: DC
  Administered 2020-10-11: 450 mg via ORAL
  Filled 2020-10-10 (×2): qty 1

## 2020-10-10 MED ORDER — INSULIN ASPART 100 UNIT/ML IJ SOLN
0.0000 [IU] | Freq: Three times a day (TID) | INTRAMUSCULAR | Status: DC
  Administered 2020-10-11: 1 [IU] via SUBCUTANEOUS
  Administered 2020-10-12 (×3): 2 [IU] via SUBCUTANEOUS
  Administered 2020-10-13: 1 [IU] via SUBCUTANEOUS
  Filled 2020-10-10 (×5): qty 1

## 2020-10-10 MED ORDER — ALBUTEROL SULFATE HFA 108 (90 BASE) MCG/ACT IN AERS: 2.0000 | INHALATION_SPRAY | RESPIRATORY_TRACT | Status: DC | PRN

## 2020-10-10 MED ORDER — DM-GUAIFENESIN ER 30-600 MG PO TB12: 1.0000 | ORAL_TABLET | Freq: Two times a day (BID) | ORAL | Status: DC | PRN

## 2020-10-10 MED ORDER — ESCITALOPRAM OXALATE 20 MG PO TABS
20.0000 mg | ORAL_TABLET | Freq: Every day | ORAL | Status: DC
  Administered 2020-10-11 – 2020-10-13 (×3): 20 mg via ORAL
  Filled 2020-10-10 (×4): qty 1

## 2020-10-10 MED ORDER — ACETAMINOPHEN 325 MG PO TABS: 650.0000 mg | ORAL_TABLET | Freq: Four times a day (QID) | ORAL | Status: DC | PRN

## 2020-10-10 NOTE — ED Notes (Signed)
Request made for transport to the floor ?

## 2020-10-10 NOTE — ED Provider Notes (Signed)
[facility] Emergency Department Provider Note  ____________________________________________   Event Date/Time   First MD Initiated Contact with Patient 10/10/20 1431     (approximate)  I have reviewed the triage vital signs and the nursing notes.   HISTORY  Chief Complaint Weakness    HPI GRIFFITH SANTILLI is a 65 y.o. male with history of liver transplant at [facility] in November 2021, ESRD on dialysis up until 2 weeks ago who comes in with increasing weakness.  Patient reports over the past 2 to 3 days having increasing weakness, constant, nothing makes better, nothing makes it worse.  He reports some hallucinations at nighttime.  Reports a history of hallucinations during the day but this is not as severe as that.  Reports mild shortness of breath.  No chest pain, abdominal pain, fevers.  No known COVID contacts.  Does have his COVID-vaccine          Past Medical History:  Diagnosis Date   Depression    History of cardiac cath    History of heart artery stent    Hyperlipidemia    Hypertension    MI (myocardial infarction) [facility])    Renal disorder     Patient Active Problem List   Diagnosis Date Noted   ACS (acute coronary syndrome) ([place])    Liver transplant recipient [facility])    Anemia of chronic disease    Type 2 diabetes mellitus with diabetic neuropathy, with long-term current use of insulin ([place])    Chest pain 08/19/2020   ESRD (end stage renal disease) ([place]) 08/19/2020   History of anemia due to chronic kidney disease 08/19/2020   Gait abnormality 10/17/2019   History of fall within past 90 days 10/17/2019   Hallucinations 10/17/2019   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 10/17/2019   HTN (hypertension) 10/17/2019   History of MI (myocardial infarction) 10/17/2019   OSA (obstructive sleep apnea) 10/17/2019   Nondisplaced comminuted supracondylar fracture without intercondylar fracture of left humerus, subsequent encounter for fracture  with nonunion 10/17/2019   Anxiety and depression 10/17/2019   Confusion and disorientation 10/17/2019    Past Surgical History:  Procedure Laterality Date   LEFT HEART CATH AND CORONARY ANGIOGRAPHY N/A 08/19/2020   Procedure: LEFT HEART CATH AND CORONARY ANGIOGRAPHY;  Surgeon: Corey Skains, MD;  Location: [place] CV LAB;  Service: Cardiovascular;  Laterality: N/A;   LIVER TRANSPLANT      Prior to Admission medications   Medication Sig Start Date End Date Taking? Authorizing Provider  aspirin 81 MG EC tablet Take 81 mg by mouth daily.    [provider]  atorvastatin (LIPITOR) 40 MG tablet Take 1 tablet (40 mg total) by mouth every evening. 08/20/20 09/19/20  Loletha Grayer, MD  clopidogrel (PLAVIX) 75 MG tablet Take 1 tablet (75 mg total) by mouth daily. 08/20/20   Loletha Grayer, MD  escitalopram (LEXAPRO) 20 MG tablet Take 20 mg by mouth daily. 08/06/20   [provider]  gabapentin (NEURONTIN) 100 MG capsule Take 100 mg by mouth 2 (two) times daily.    [provider]  hydrOXYzine (VISTARIL) 25 MG capsule Take 25 mg by mouth every 6 (six) hours as needed for anxiety or itching.    [provider]  melatonin 3 MG TABS tablet Take 9 mg by mouth at bedtime.    [provider]  metoCLOPramide (REGLAN) 5 MG tablet Take 5 mg by mouth in the morning, at noon, and at  bedtime.    [provider]  metoprolol tartrate (LOPRESSOR) 25 MG tablet Take 1 tablet (25 mg total) by mouth 2 (two) times daily. 08/20/20   Loletha Grayer, MD  mirtazapine (REMERON) 15 MG tablet Take 15 mg by mouth at bedtime. 08/06/20   [provider]  mycophenolate (CELLCEPT) 250 MG capsule Take 1,000 mg by mouth 2 (two) times daily.    [provider]  nitroGLYCERIN (NITROSTAT) 0.4 MG SL tablet Place 1 tablet (0.4 mg total) under the tongue every 5 (five) minutes as needed for chest pain. 08/20/20   Loletha Grayer, MD  NOVOLIN R 100 UNIT/ML  injection Inject 0-11 Units into the skin See admin instructions. Take 6 units at 6pm plus correction with meal time. Correction scale with meals.  Glucose Range  <200 none, 201 - 250 mg/dL 1 unit, 251 - 300 mg/dL 2 units, 301 - 350 mg/dL 3 units, 351- 400 mg/dl 4 units , >400 mg/dL take 5 units and call your provider    [provider]  pantoprazole (PROTONIX) 40 MG tablet Take 1 tablet (40 mg total) by mouth 2 (two) times daily. 08/20/20   Loletha Grayer, MD  predniSONE (DELTASONE) 5 MG tablet Take 30 mg by mouth daily.    [provider]  senna-docusate (SENOKOT-S) 8.6-50 MG tablet Take 2 tablets by mouth 2 (two) times daily. Patient taking differently: Take 2 tablets by mouth 2 (two) times daily as needed for mild constipation or moderate constipation. 10/23/19   Hosie Poisson, MD  sulfamethoxazole-trimethoprim (BACTRIM DS) 800-160 MG tablet Take 1 tablet by mouth every Monday, Wednesday, and Friday. 08/06/20   [provider]  tacrolimus (PROGRAF) 1 MG capsule Take 7-8 mg by mouth See admin instructions. Take 7 capsules (7mg ) by mouth every morning and take 8 capsules (8mg ) by mouth every evening    [provider]  traZODone (DESYREL) 150 MG tablet Take 150 mg by mouth at bedtime as needed for sleep.    [provider]  valGANciclovir (VALCYTE) 450 MG tablet Take 450 mg by mouth 2 (two) times a week. (Monday and Thursday) with largest meal of the day.    [provider]    Allergies Patient has no known allergies.  Family History  Problem Relation Age of Onset   Diabetes Mellitus II Mother    Pancreatic cancer Father     Social History Social History   Tobacco Use   Smoking status: Former    Pack years: 0.00   Smokeless tobacco: Never  Substance Use Topics   Alcohol use: Yes    Comment: occasionally   Drug use: Never      Review of Systems Constitutional: No fever/chills, positive weakness Eyes: No visual changes. ENT:  No sore throat. Cardiovascular: Denies chest pain. Respiratory: Positive shortness of breath Gastrointestinal: No abdominal pain.  No nausea, no vomiting.  No diarrhea.  No constipation. Genitourinary: Negative for dysuria. Musculoskeletal: Negative for back pain. Skin: Negative for rash. Neurological: Negative for headaches, focal weakness or numbness. All other ROS negative ____________________________________________   PHYSICAL EXAM:  VITAL SIGNS: ED Triage Vitals  Enc Vitals Group     BP 10/10/20 1013 (!) 130/91     Pulse Rate 10/10/20 1013 73     Resp 10/10/20 1013 19     Temp 10/10/20 1013 97.9 F (36.6 C)     Temp Source 10/10/20 1013 Oral     SpO2 10/10/20 1013 99 %     Weight 10/10/20 1014  176 lb 2.4 oz (79.9 kg)     Height 10/10/20 1014 6\' 1"  (1.854 m)     Head Circumference --      Peak Flow --      Pain Score 10/10/20 1013 6     Pain Loc --      Pain Edu? --      Excl. in [place]? --     Constitutional: Alert and oriented.  Thin Eyes: Conjunctivae are normal. EOMI. Head: Atraumatic. Nose: No congestion/rhinnorhea. Mouth/Throat: Mucous membranes are moist.   Neck: No stridor. Trachea Midline. FROM Cardiovascular: Normal rate, regular rhythm. Grossly normal heart sounds.  Good peripheral circulation.  Dialysis catheter in the right chest wall Respiratory: Normal respiratory effort.  No retractions. Lungs CTAB. Gastrointestinal: Soft and nontender. No distention. No abdominal bruits.  Musculoskeletal: No lower extremity tenderness nor edema.  No joint effusions. Neurologic:  Normal speech and language. No gross focal neurologic deficits are appreciated.  Skin:  Skin is warm, dry and intact. No rash noted. Psychiatric: Mood and affect are normal. Speech and behavior are normal. GU: Deferred   ____________________________________________   LABS (all labs ordered are listed, but only abnormal results are displayed)  Labs Reviewed  BASIC METABOLIC PANEL -  Abnormal; Notable for the following components:      Result Value   Sodium 131 (*)    Potassium 5.7 (*)    Glucose, Bld 195 (*)    BUN 102 (*)    Creatinine, Ser 2.96 (*)    GFR, Estimated 23 (*)    All other components within normal limits  CBC - Abnormal; Notable for the following components:   WBC 3.8 (*)    RBC 2.61 (*)    Hemoglobin 9.0 (*)    HCT 26.6 (*)    MCV 101.9 (*)    MCH 34.5 (*)    All other components within normal limits  RESP PANEL BY RT-PCR (FLU A&B, COVID) ARPGX2  URINALYSIS, COMPLETE (UACMP) WITH MICROSCOPIC  BRAIN NATRIURETIC PEPTIDE  AMMONIA  HEPATIC FUNCTION PANEL  PROTIME-INR  TROPONIN I (HIGH SENSITIVITY)   ____________________________________________   ED ECG REPORT I, Vanessa Dover, the attending physician, personally viewed and interpreted this ECG.  Normal sinus rate of 73, no ST elevation, no T wave inversions, normal intervals ____________________________________________  RADIOLOGY Robert Bellow, personally viewed and evaluated these images (plain radiographs) as part of my medical decision making, as well as reviewing the written report by the radiologist.  ED MD interpretation:  pending   Official radiology report(s): No results found.  ____________________________________________   PROCEDURES  Procedure(s) performed (including Critical Care):  .1-3 Lead EKG Interpretation  Date/Time: 10/10/2020 2:53 PM Performed by: Vanessa Scott, MD Authorized by: Vanessa Morningside, MD     Interpretation: normal     ECG rate:  60s   ECG rate assessment: normal     Rhythm: sinus rhythm     Ectopy: none     Conduction: normal     ____________________________________________   INITIAL IMPRESSION / ASSESSMENT AND PLAN / ED COURSE  NICKALAUS CROOKE was evaluated in Emergency Department on 10/10/2020 for the symptoms described in the history of present illness. He was evaluated in the context of the global COVID-19 pandemic, which necessitated  consideration that the patient might be at risk for infection with the SARS-CoV-2 virus that causes COVID-19. Institutional protocols and algorithms that pertain to the evaluation of patients at risk for COVID-19 are in a state  of rapid change based on information released by regulatory bodies including the CDC and federal and state organizations. These policies and algorithms were followed during the patient's care in the ED.    Patient is a 65 year old who has had prior liver transplant who comes in with weakness, shortness of breath in the setting of recently been taken off dialysis.  Labs ordered to evaluate for [place] abnormalities, AKI.  Chest x-ray added on to evaluate for pneumonia, COVID swab added on.  Troponin added to evaluate for ACS.  Labs show elevated BUN.  I suspect that this is the cause of patient's symptoms but will confirm that this is not related to his liver with ammonia and LFTs.  Patient handed off to oncoming team pending these results       ____________________________________________   FINAL CLINICAL IMPRESSION(S) / ED DIAGNOSES   Final diagnoses:  Weakness  Elevated BUN      MEDICATIONS GIVEN DURING THIS VISIT:  Medications - No data to display   ED Discharge Orders     None        Note:  This document was prepared using Dragon voice recognition software and may include unintentional dictation errors.    Vanessa Benson, MD 10/10/20 [phone]

## 2020-10-10 NOTE — H&P (Addendum)
History and Physical    Alex Hampton TUU:[id number] DOB: 1955-10-24 DOA: 10/10/2020  Referring MD/NP/PA:   PCP: Juluis Pitch, MD   Patient coming from:  The patient is coming from home.  At baseline, pt is independent for most of ADL.        Chief Complaint: Generalized weakness, diarrhea  HPI: Alex Hampton is a 65 y.o. male with medical history significant of hypertension, hyperlipidemia, GERD, depression with anxiety, CAD, stent placement, anemia, liver cirrhosis with ascites (s/p of liver transplantation in [facility], on immunosuppressant), ESRD, OSA not on CPAP, s/p of G -tube, who presents with generalized weakness and diarrhea.  Patient states that he has generalized weakness, which has been going on for about 3 days.  No unilateral numbness or tingling to extremities.  No facial droop or slurred speech.  Patient also has diarrhea in the past several days. He has had at least 5 times of loose stool BM since yesterday.  He has nausea, no vomiting, abdominal pain.  No fever or chills.  She has mild shortness breath, mild cough with little clear mucus production.  No chest pain.  Patient has hx of ESRD. He has been off dialysis 2 weeks ago. No symptoms of UTI.  He states that he has chronic visual hallucination, seeing things which are not there. This symptom is less frequent recently.  No suicidal or homicidal ideations.  Patient has G-tube in place.  He states that he uses G-tube nightly. He also eats food orally without choking.  Per his son, patient has mild intermittent confusion, but currently patient is alert and oriented x3.  ED Course: pt was found to have WBC 3.8, INR 1.1, troponin level 28, BNP 557, negative COVID PCR, ammonia level 9, potassium 5.9, bicarbonate 23, creatinine 2.96, BUN 102, temperature normal, blood pressure 165/77, heart rate 61, RR 17, oxygen saturation 99% on room air.  Chest x-ray showed improved lower lobe aeration.  Patient is admitted to progressive bed as  inpatient  Review of Systems:   General: no fevers, chills, no body weight gain, has fatigue HEENT: no blurry vision, hearing changes or sore throat Respiratory: has dyspnea, coughing, no wheezing CV: no chest pain, no palpitations GI: has nausea, no vomiting, abdominal pain, has diarrhea, no constipation GU: no dysuria, burning on urination, increased urinary frequency, hematuria  Ext: no leg edema Neuro: no unilateral weakness, numbness, or tingling, no vision change or hearing loss Skin: no rash, no skin tear. MSK: No muscle spasm, no deformity, no limitation of range of movement in spin Heme: No easy bruising.  Travel history: No recent long distant travel. Psychiatry: Has hallucination  Allergy: No Known Allergies  Past Medical History:  Diagnosis Date   Depression    History of cardiac cath    History of heart artery stent    Hyperlipidemia    Hypertension    MI (myocardial infarction) ([place])    Renal disorder     Past Surgical History:  Procedure Laterality Date   LEFT HEART CATH AND CORONARY ANGIOGRAPHY N/A 08/19/2020   Procedure: LEFT HEART CATH AND CORONARY ANGIOGRAPHY;  Surgeon: Corey Skains, MD;  Location: [facility];  Service: Cardiovascular;  Laterality: N/A;   LIVER TRANSPLANT      Social History:  reports that he has quit smoking. He has never used smokeless tobacco. He reports current alcohol use. He reports that he does not use drugs.  Family History:  Family History  Problem Relation Age  of Onset   Diabetes Mellitus II Mother    Pancreatic cancer Father      Prior to Admission medications   Medication Sig Start Date End Date Taking? Authorizing Provider  aspirin 81 MG EC tablet Take 81 mg by mouth daily.    [provider]  atorvastatin (LIPITOR) 40 MG tablet Take 1 tablet (40 mg total) by mouth every evening. 08/20/20 09/19/20  Loletha Grayer, MD  clopidogrel (PLAVIX) 75 MG tablet Take 1 tablet (75 mg total) by mouth daily.  08/20/20   Loletha Grayer, MD  escitalopram (LEXAPRO) 20 MG tablet Take 20 mg by mouth daily. 08/06/20   [provider]  gabapentin (NEURONTIN) 100 MG capsule Take 100 mg by mouth 2 (two) times daily.    [provider]  hydrOXYzine (VISTARIL) 25 MG capsule Take 25 mg by mouth every 6 (six) hours as needed for anxiety or itching.    [provider]  melatonin 3 MG TABS tablet Take 9 mg by mouth at bedtime.    [provider]  metoCLOPramide (REGLAN) 5 MG tablet Take 5 mg by mouth in the morning, at noon, and at bedtime.    [provider]  metoprolol tartrate (LOPRESSOR) 25 MG tablet Take 1 tablet (25 mg total) by mouth 2 (two) times daily. 08/20/20   Loletha Grayer, MD  mirtazapine (REMERON) 15 MG tablet Take 15 mg by mouth at bedtime. 08/06/20   [provider]  mycophenolate (CELLCEPT) 250 MG capsule Take 1,000 mg by mouth 2 (two) times daily.    [provider]  nitroGLYCERIN (NITROSTAT) 0.4 MG SL tablet Place 1 tablet (0.4 mg total) under the tongue every 5 (five) minutes as needed for chest pain. 08/20/20   Loletha Grayer, MD  NOVOLIN R 100 UNIT/ML injection Inject 0-11 Units into the skin See admin instructions. Take 6 units at 6pm plus correction with meal time. Correction scale with meals.  Glucose Range  <200 none, 201 - 250 mg/dL 1 unit, 251 - 300 mg/dL 2 units, 301 - 350 mg/dL 3 units, 351- 400 mg/dl 4 units , >400 mg/dL take 5 units and call your provider    [provider]  pantoprazole (PROTONIX) 40 MG tablet Take 1 tablet (40 mg total) by mouth 2 (two) times daily. 08/20/20   Loletha Grayer, MD  predniSONE (DELTASONE) 5 MG tablet Take 30 mg by mouth daily.    [provider]  senna-docusate (SENOKOT-S) 8.6-50 MG tablet Take 2 tablets by mouth 2 (two) times daily. Patient taking differently: Take 2 tablets by mouth 2 (two) times daily as needed for mild constipation or moderate constipation. 10/23/19    Hosie Poisson, MD  sulfamethoxazole-trimethoprim (BACTRIM DS) 800-160 MG tablet Take 1 tablet by mouth every Monday, Wednesday, and Friday. 08/06/20   [provider]  tacrolimus (PROGRAF) 1 MG capsule Take 7-8 mg by mouth See admin instructions. Take 7 capsules (7mg ) by mouth every morning and take 8 capsules (8mg ) by mouth every evening    [provider]  traZODone (DESYREL) 150 MG tablet Take 150 mg by mouth at bedtime as needed for sleep.    [provider]  valGANciclovir (VALCYTE) 450 MG tablet Take 450 mg by mouth 2 (two) times a week. (Monday and Thursday) with largest meal of the day.    [provider]    Physical Exam: Vitals:   10/10/20 1014 10/10/20 1434 10/10/20 1543 10/10/20 1756  BP:  (!) 162/87 (!) 165/77 (!) 160/74  Pulse:  60 61 63  Resp:  17 17 17   Temp:      TempSrc:      SpO2:  100% 100% 100%  Weight: 79.9 kg     Height: 6\' 1"  (1.854 m)      General: Not in acute distress HEENT:       Eyes: PERRL, EOMI, no scleral icterus.       ENT: No discharge from the ears and nose, no pharynx injection, no tonsillar enlargement.        Neck: No JVD, no bruit, no mass felt. Heme: No neck lymph node enlargement. Cardiac: S1/S2, RRR, No murmurs, No gallops or rubs. Respiratory: No rales, wheezing, rhonchi or rubs. GI: Soft, nondistended, nontender, no rebound pain, no organomegaly, BS present.  S/p for G-tube in left abdomen GU: No hematuria Ext: No pitting leg edema bilaterally. 1+DP/PT pulse bilaterally. Musculoskeletal: No joint deformities, No joint redness or warmth, no limitation of ROM in spin. Skin: No rashes.  Neuro: Alert, oriented X3, cranial nerves II-XII grossly intact, moves all extremities normally.  Psych: Has hallucination, no suicidal or hemocidal ideation.  Labs on Admission: I have personally reviewed following labs and imaging studies  CBC: Recent Labs  Lab 10/10/20 1016  WBC 3.8*  HGB 9.0*  HCT 26.6*  MCV  101.9*  PLT 423   Basic Metabolic Panel: Recent Labs  Lab 10/10/20 1016  NA 131*  K 5.7*  CL 98  CO2 23  GLUCOSE 195*  BUN 102*  CREATININE 2.96*  CALCIUM 10.1   GFR: Estimated Creatinine Clearance: 27.7 mL/min (A) (by C-G formula based on SCr of 2.96 mg/dL (H)). Liver Function Tests: Recent Labs  Lab 10/10/20 1016  AST 18  ALT 13  ALKPHOS 92  BILITOT 0.5  PROT 7.4  ALBUMIN 4.0   No results for input(s): LIPASE, AMYLASE in the last 168 hours. Recent Labs  Lab 10/10/20 1500  AMMONIA 9   Coagulation Profile: Recent Labs  Lab 10/10/20 1500  INR 1.1   Cardiac Enzymes: No results for input(s): CKTOTAL, CKMB, CKMBINDEX, TROPONINI in the last 168 hours. BNP (last 3 results) No results for input(s): PROBNP in the last 8760 hours. HbA1C: No results for input(s): HGBA1C in the last 72 hours. CBG: No results for input(s): GLUCAP in the last 168 hours. Lipid Profile: No results for input(s): CHOL, HDL, LDLCALC, TRIG, CHOLHDL, LDLDIRECT in the last 72 hours. Thyroid Function Tests: No results for input(s): TSH, T4TOTAL, FREET4, T3FREE, THYROIDAB in the last 72 hours. Anemia Panel: No results for input(s): VITAMINB12, FOLATE, FERRITIN, TIBC, IRON, RETICCTPCT in the last 72 hours. Urine analysis:    Component Value Date/Time   COLORURINE YELLOW (A) 10/17/2019 0422   APPEARANCEUR CLEAR (A) 10/17/2019 0422   LABSPEC 1.011 10/17/2019 0422   PHURINE 5.0 10/17/2019 0422   GLUCOSEU NEGATIVE 10/17/2019 0422   HGBUR NEGATIVE 10/17/2019 0422   BILIRUBINUR NEGATIVE 10/17/2019 0422   KETONESUR NEGATIVE 10/17/2019 0422   PROTEINUR NEGATIVE 10/17/2019 0422   NITRITE NEGATIVE 10/17/2019 0422   LEUKOCYTESUR NEGATIVE 10/17/2019 0422   Sepsis Labs: @LABRCNTIP (procalcitonin:4,lacticidven:4) ) Recent Results (from the past 240 hour(s))  Resp Panel by RT-PCR (Flu A&B, Covid) Nasopharyngeal Swab     Status: None   Collection Time: 10/10/20  2:45 PM   Specimen: Nasopharyngeal  Swab; Nasopharyngeal(NP) swabs in vial transport medium  Result Value Ref Range Status   SARS Coronavirus 2 by RT PCR NEGATIVE NEGATIVE Final    Comment: (NOTE) SARS-CoV-2 target nucleic  acids are NOT DETECTED.  The SARS-CoV-2 RNA is generally detectable in upper respiratory specimens during the acute phase of infection. The lowest concentration of SARS-CoV-2 viral copies this assay can detect is 138 copies/mL. A negative result does not preclude SARS-Cov-2 infection and should not be used as the sole basis for treatment or other patient management decisions. A negative result may occur with  improper specimen collection/handling, submission of specimen other than nasopharyngeal swab, presence of viral mutation(s) within the areas targeted by this assay, and inadequate number of viral copies(<138 copies/mL). A negative result must be combined with clinical observations, patient history, and epidemiological information. The expected result is Negative.  Fact Sheet for Patients:  EntrepreneurPulse.com.au  Fact Sheet for Healthcare Providers:  IncredibleEmployment.be  This test is no t yet approved or cleared by the [place] FDA and  has been authorized for detection and/or diagnosis of SARS-CoV-2 by FDA under an Emergency Use Authorization (EUA). This EUA will remain  in effect (meaning this test can be used) for the duration of the COVID-19 declaration under Section 564(b)(1) of the Act, 21 U.S.C.section 360bbb-3(b)(1), unless the authorization is terminated  or revoked sooner.       Influenza A by PCR NEGATIVE NEGATIVE Final   Influenza B by PCR NEGATIVE NEGATIVE Final    Comment: (NOTE) The Xpert Xpress SARS-CoV-2/FLU/RSV plus assay is intended as an aid in the diagnosis of influenza from Nasopharyngeal swab specimens and should not be used as a sole basis for treatment. Nasal washings and aspirates are unacceptable for Xpert Xpress  SARS-CoV-2/FLU/RSV testing.  Fact Sheet for Patients: EntrepreneurPulse.com.au  Fact Sheet for Healthcare Providers: IncredibleEmployment.be  This test is not yet approved or cleared by the [place] FDA and has been authorized for detection and/or diagnosis of SARS-CoV-2 by FDA under an Emergency Use Authorization (EUA). This EUA will remain in effect (meaning this test can be used) for the duration of the COVID-19 declaration under Section 564(b)(1) of the Act, 21 U.S.C. section 360bbb-3(b)(1), unless the authorization is terminated or revoked.  Performed at [facility], [address] [place], [place] [address]      Radiological Exams on Admission: DG Chest 2 View  Result Date: 10/10/2020 CLINICAL DATA:  Shortness of breath. History of liver transplant 01 March 2020. End-stage renal disease on dialysis. Weakness. EXAM: CHEST - 2 VIEW COMPARISON:  08/19/2020 FINDINGS: Patient has RIGHT-sided dialysis catheter, tip overlying the level of the LOWER superior vena/UPPER RIGHT atrium. Heart size is normal. There has been improvement in aeration of the LOWER lobes since the previous exam. Nearly resolved bilateral pleural effusions. No edema. Partially imaged probable gastrojejunostomy. IMPRESSION: Improved aeration in the LOWER lobes. Minimal scarring or atelectasis persists at the lung bases. Almost completely resolved bilateral pleural effusions. Electronically Signed   By: Nolon Nations M.D.   On: 10/10/2020 15:32     EKG: I have personally reviewed.  Sinus rhythm, QTC 396, ST depression in lateral leads and inferior leads, mild T wave peaking.  Assessment/Plan Principal Problem:   Uremia Active Problems:   Hallucinations   HTN (hypertension)   Anxiety and depression   ESRD (end stage renal disease) (HCC)   Liver transplant recipient ([place])   Anemia in ESRD (end-stage renal disease) (HCC)   Acute metabolic encephalopathy    Hyperkalemia   Elevated troponin   GERD (gastroesophageal reflux disease)   CAD (coronary artery disease)   G tube feedings (HCC)   Diarrhea   Type II diabetes mellitus  with renal manifestations ([place])  Uremia and hx of ESRD: Patient has been off dialysis for about 2 weeks.  Now developed uremia with potassium of 5.7, bicarbonate 23, creatinine 2.96, BUN 102.  Patient has some mild shortness breath, no oxygen desaturation.  Does not seem to have a fluid overload. -Admitted to progressive bed as inpatient -Dr. Holley Raring of renal is consulted for dialysis.  Hallucinations: This is chronic issue.  May be related to uremia.  Patient does not have suicidal or homicidal ideations. -Observe closely  HTN (hypertension) -IV hydralazine as needed -Metoprolol  Anxiety and depression -Continue home medications  Liver transplant recipient [place]): INR 1.1.  Ammonia level normal. -Continue home immunosuppressants: CellCept, tacrolimus, prednisone -Give Solu-Cortef 100 mg x 1 as stress dose -Patient is on Bactrim and valganciclovir prophylaxis  Anemia in ESRD (end-stage renal disease) ([place]): Hemoglobin 9.0, stable.  Hemoglobin was 7.5 on 08/19/2020 -Follow-up with CBC  Acute metabolic encephalopathy: Patient has intermittent mild confusion, likely due to uremia.  Currently patient is oriented x3 and alert. -Frequent neurochecks  Hyperkalemia -Patient was treated with D50, 5 units of NovoLog - 5 g of leukoma -Dialysis per renal  Elevated troponin and history of CAD: S/p of stent placement: Patient does not have chest pain.  Troponin level 28.  Patient has chronically elevated troponin, recently baseline 61-145.  Likely due to decreased clearance in the setting of ESRD -Trend troponin -Continue aspirin, Plavix, Lipitor -As needed nitroglycerin -Check A1c, FLP  GERD (gastroesophageal reflux disease) -Protonix  G tube feedings (HCC) -Consult pharm for nightly tube  feeding  Diarrhea -Follow-up C. difficile test and GI pathogen panel  Type II diabetes mellitus with renal manifestations ([place]): No A1c on record.  Blood sugar 195 today.  Patient is taking Novolin at home -Sliding scale insulin    DVT ppx: SQ Heparin    Code Status: Full code Family Communication:   Yes, patient's son at bed side Disposition Plan:  Anticipate discharge back to previous environment Consults called: Dr. Holley Raring of renal Admission status and Level of care: Progressive Cardiac:   as inpt    Status is: Inpatient  Remains inpatient appropriate because:Inpatient level of care appropriate due to severity of illness  Dispo: The patient is from: Home              Anticipated d/c is to: Home              Patient currently is not medically stable to d/c.   Difficult to place patient No         Date of Service 10/10/2020    Ivor Costa [facility]   If 7PM-7AM, please contact night-coverage [email or web address] 10/10/2020, 6:09 PM

## 2020-10-10 NOTE — ED Triage Notes (Signed)
Pt comes into the ED via [facility] from home c/o generalized weakness that started yesterday.  Pt is recent post liver transplant in January.  Pt also admits to [facility].  72 HR, 91% RA, 131/81, CBG 224.  Pt just came off dialysis.  Pt currently in NAD at this time with even and unlabored respirations.  Pt does currently also have a G-tube.

## 2020-10-11 LAB — BASIC METABOLIC PANEL
Anion gap: 10 (ref 5–15)
BUN: 99 mg/dL — ABNORMAL HIGH (ref 8–23)
CO2: 22 mmol/L (ref 22–32)
Calcium: 10.3 mg/dL (ref 8.9–10.3)
Chloride: 100 mmol/L (ref 98–111)
Creatinine, Ser: 2.83 mg/dL — ABNORMAL HIGH (ref 0.61–1.24)
GFR, Estimated: 24 mL/min — ABNORMAL LOW (ref >60)
Glucose, Bld: 154 mg/dL — ABNORMAL HIGH (ref 70–99)
Potassium: 6.4 mmol/L (ref 3.5–5.1)
Sodium: 132 mmol/L — ABNORMAL LOW (ref 135–145)

## 2020-10-11 LAB — CBC
HCT: 24.8 % — ABNORMAL LOW (ref 39.0–52.0)
Hemoglobin: 8.5 g/dL — ABNORMAL LOW (ref 13.0–17.0)
MCH: 33.3 pg (ref 26.0–34.0)
MCHC: 34.3 g/dL (ref 30.0–36.0)
MCV: 97.3 fL (ref 80.0–100.0)
Platelets: 157 10*3/uL (ref 150–400)
RBC: 2.55 MIL/uL — ABNORMAL LOW (ref 4.22–5.81)
RDW: 13.9 % (ref 11.5–15.5)
WBC: 3.3 10*3/uL — ABNORMAL LOW (ref 4.0–10.5)
nRBC: 0 % (ref 0.0–0.2)

## 2020-10-11 LAB — PHOSPHORUS: Phosphorus: 5.4 mg/dL — ABNORMAL HIGH (ref 2.5–4.6)

## 2020-10-11 LAB — GLUCOSE, CAPILLARY
Glucose-Capillary: 141 mg/dL — ABNORMAL HIGH (ref 70–99)
Glucose-Capillary: 113 mg/dL — ABNORMAL HIGH (ref 70–99)
Glucose-Capillary: 122 mg/dL — ABNORMAL HIGH (ref 70–99)
Glucose-Capillary: 167 mg/dL — ABNORMAL HIGH (ref 70–99)

## 2020-10-11 LAB — LIPID PANEL
Cholesterol: 119 mg/dL (ref 0–200)
HDL: 52 mg/dL (ref >40)
LDL Cholesterol: 45 mg/dL (ref 0–99)
Total CHOL/HDL Ratio: 2.3 RATIO
Triglycerides: 109 mg/dL (ref <150)
VLDL: 22 mg/dL (ref 0–40)

## 2020-10-11 LAB — HEPATITIS B SURFACE ANTIBODY,QUALITATIVE: Hep B S Ab: REACTIVE — AB

## 2020-10-11 LAB — TROPONIN I (HIGH SENSITIVITY): Troponin I (High Sensitivity): 34 ng/L — ABNORMAL HIGH (ref <18)

## 2020-10-11 LAB — HEPATITIS B CORE ANTIBODY, TOTAL: Hep B Core Total Ab: NONREACTIVE

## 2020-10-11 LAB — HEPATITIS B SURFACE ANTIGEN: Hepatitis B Surface Ag: NONREACTIVE

## 2020-10-11 MED ORDER — LIDOCAINE HCL (PF) 1 % IJ SOLN
5.0000 mL | INTRAMUSCULAR | Status: DC | PRN
  Filled 2020-10-11: qty 5

## 2020-10-11 MED ORDER — SODIUM ZIRCONIUM CYCLOSILICATE 10 G PO PACK
10.0000 g | PACK | Freq: Once | ORAL | Status: AC
  Administered 2020-10-11: 10 g via ORAL
  Filled 2020-10-11: qty 1

## 2020-10-11 MED ORDER — HEPARIN SODIUM (PORCINE) 1000 UNIT/ML DIALYSIS
1000.0000 [IU] | INTRAMUSCULAR | Status: DC | PRN
  Filled 2020-10-11: qty 1

## 2020-10-11 MED ORDER — SODIUM CHLORIDE 0.9 % IV SOLN: 100.0000 mL | INTRAVENOUS | Status: DC | PRN

## 2020-10-11 MED ORDER — LIDOCAINE-PRILOCAINE 2.5-2.5 % EX CREA
1.0000 "application " | TOPICAL_CREAM | CUTANEOUS | Status: DC | PRN
  Filled 2020-10-11: qty 5

## 2020-10-11 MED ORDER — CHLORHEXIDINE GLUCONATE CLOTH 2 % EX PADS
6.0000 | MEDICATED_PAD | Freq: Every day | CUTANEOUS | Status: DC
  Administered 2020-10-11 – 2020-10-14 (×3): 6 via TOPICAL

## 2020-10-11 MED ORDER — CHLORHEXIDINE GLUCONATE CLOTH 2 % EX PADS
6.0000 | MEDICATED_PAD | Freq: Every day | CUTANEOUS | Status: DC
  Administered 2020-10-13: 6 via TOPICAL

## 2020-10-11 MED ORDER — PENTAFLUOROPROP-TETRAFLUOROETH EX AERO
1.0000 "application " | INHALATION_SPRAY | CUTANEOUS | Status: DC | PRN
  Filled 2020-10-11: qty 30

## 2020-10-11 MED ORDER — NEPRO/CARBSTEADY PO LIQD
1000.0000 mL | ORAL | Status: DC
  Administered 2020-10-11: 1000 mL

## 2020-10-11 MED ORDER — ALTEPLASE 2 MG IJ SOLR: 2.0000 mg | Freq: Once | INTRAMUSCULAR | Status: DC | PRN

## 2020-10-11 MED ORDER — SULFAMETHOXAZOLE-TRIMETHOPRIM 400-80 MG PO TABS
1.0000 | ORAL_TABLET | ORAL | Status: DC
  Administered 2020-10-13: 1 via ORAL
  Filled 2020-10-11: qty 1

## 2020-10-11 NOTE — Progress Notes (Signed)
PROGRESS NOTE    Alex Hampton  WJX:[id number] DOB: [birth year] DOA: 10/10/2020 PCP: Juluis Pitch, MD   Chief complaint.  Generalized weakness. Brief Narrative:  Alex Hampton is a 65 y.o. male with medical history significant of hypertension, hyperlipidemia, GERD, depression with anxiety, CAD, stent placement, anemia, liver cirrhosis with ascites (s/p of liver transplantation in [facility], on immunosuppressant), ESRD, OSA not on CPAP, s/p of G -tube, who presents with generalized weakness.  He also complains some loose stools, happens once a day, small to moderate amount, not watery. Upon arriving the emergency room, he was found to have significant hyperkalemia.  Potassium went up to 6.4.  Creatinine 2.83.  Nephrology consult is obtained.   Assessment & Plan:   Principal Problem:   Uremia Active Problems:   Hallucinations   HTN (hypertension)   Anxiety and depression   ESRD (end stage renal disease) (HCC)   Liver transplant recipient ([place])   Anemia in ESRD (end-stage renal disease) (HCC)   Acute metabolic encephalopathy   Hyperkalemia   Elevated troponin   GERD (gastroesophageal reflux disease)   CAD (coronary artery disease)   G tube feedings (HCC)   Diarrhea   Type II diabetes mellitus with renal manifestations ([place])  #1.  End-stage renal disease. Hyperkalemia. Hyponatremia. Metabolic encephalopathy. Patient has persistent hyperkalemia.  Patient was taking off of dialysis for 1 week, he has been making 2 L of urine every day. His mental status has improved. Discussed with Dr. Holley Raring, patient will be bring down to dialysis shortly today.  #2.  History of liver transplant Continue immunosuppressants.  3.  Type 2 diabetes. Continue sliding scale insulin.  4.  Elevated troponin. Minimal elevation troponin secondary to end-stage renal disease.  Follow.  #5.  Loose stools. Patient only has 1 loose stool a day.  Follow.  DVT prophylaxis: Heparin Code Status: full Family  Communication:  Disposition Plan:    Status is: Inpatient  Remains inpatient appropriate because:Inpatient level of care appropriate due to severity of illness  Dispo: The patient is from: Home              Anticipated d/c is to: Home              Patient currently is not medically stable to d/c.   Difficult to place patient No        I/O last 3 completed shifts: In: -  Out: 300 [Urine:300] Total I/O In: 380 [P.O.:380] Out: 250 [Urine:250]     Consultants:  Nephrology  Procedures: None  Antimicrobials: None   Subjective: Patient no longer has any confusion today, he does not have any abdominal pain or nausea vomiting.  He had some loose stool in the morning. No fever chills No short of breath or cough. No headache or dizziness. No dysuria hematuria.  He still making urine.  Objective: Vitals:   10/11/20 0351 10/11/20 0733 10/11/20 0924 10/11/20 1145  BP: (!) 161/87 (!) 157/88  (!) 144/96  Pulse: (!) 56 (!) 57 98 62  Resp: 16 17  17   Temp: 97.8 F (36.6 C) 98.1 F (36.7 C)  (!) 97.2 F (36.2 C)  TempSrc: Oral     SpO2:  100%  100%  Weight:      Height:        Intake/Output Summary (Last 24 hours) at 10/11/2020 1216 Last data filed at 10/11/2020 1148 Gross per 24 hour  Intake 380 ml  Output 550 ml  Net -170 ml   Filed  Weights   10/10/20 1014  Weight: 79.9 kg    Examination:  General exam: Appears calm and comfortable  Respiratory system: Clear to auscultation. Respiratory effort normal. Cardiovascular system: S1 & S2 heard, RRR. No JVD, murmurs, rubs, gallops or clicks. No pedal edema. Gastrointestinal system: Abdomen is nondistended, soft and nontender. No organomegaly or masses felt. Normal bowel sounds heard. Central nervous system: Alert and oriented x3. No focal neurological deficits. Extremities: Symmetric 5 x 5 power. Skin: No rashes, lesions or ulcers Psychiatry: Judgement and insight appear normal. Mood & affect appropriate.      Data Reviewed: I have personally reviewed following labs and imaging studies  CBC: Recent Labs  Lab 10/10/20 1016 10/11/20 0427  WBC 3.8* 3.3*  HGB 9.0* 8.5*  HCT 26.6* 24.8*  MCV 101.9* 97.3  PLT 161 614   Basic Metabolic Panel: Recent Labs  Lab 10/10/20 1016 10/11/20 0427  NA 131* 132*  K 5.7* 6.4*  CL 98 100  CO2 23 22  GLUCOSE 195* 154*  BUN 102* 99*  CREATININE 2.96* 2.83*  CALCIUM 10.1 10.3   GFR: Estimated Creatinine Clearance: 29 mL/min (A) (by C-G formula based on SCr of 2.83 mg/dL (H)). Liver Function Tests: Recent Labs  Lab 10/10/20 1016  AST 18  ALT 13  ALKPHOS 92  BILITOT 0.5  PROT 7.4  ALBUMIN 4.0   No results for input(s): LIPASE, AMYLASE in the last 168 hours. Recent Labs  Lab 10/10/20 1500  AMMONIA 9   Coagulation Profile: Recent Labs  Lab 10/10/20 1500  INR 1.1   Cardiac Enzymes: No results for input(s): CKTOTAL, CKMB, CKMBINDEX, TROPONINI in the last 168 hours. BNP (last 3 results) No results for input(s): PROBNP in the last 8760 hours. HbA1C: No results for input(s): HGBA1C in the last 72 hours. CBG: Recent Labs  Lab 10/10/20 2349 10/11/20 0833 10/11/20 1147  GLUCAP 231* 141* 113*   Lipid Profile: Recent Labs    10/11/20 0427  CHOL 119  HDL 52  LDLCALC 45  TRIG 109  CHOLHDL 2.3   Thyroid Function Tests: No results for input(s): TSH, T4TOTAL, FREET4, T3FREE, THYROIDAB in the last 72 hours. Anemia Panel: No results for input(s): VITAMINB12, FOLATE, FERRITIN, TIBC, IRON, RETICCTPCT in the last 72 hours. Sepsis Labs: No results for input(s): PROCALCITON, LATICACIDVEN in the last 168 hours.  Recent Results (from the past 240 hour(s))  Resp Panel by RT-PCR (Flu A&B, Covid) Nasopharyngeal Swab     Status: None   Collection Time: 10/10/20  2:45 PM   Specimen: Nasopharyngeal Swab; Nasopharyngeal(NP) swabs in vial transport medium  Result Value Ref Range Status   SARS Coronavirus 2 by RT PCR NEGATIVE NEGATIVE  Final    Comment: (NOTE) SARS-CoV-2 target nucleic acids are NOT DETECTED.  The SARS-CoV-2 RNA is generally detectable in upper respiratory specimens during the acute phase of infection. The lowest concentration of SARS-CoV-2 viral copies this assay can detect is 138 copies/mL. A negative result does not preclude SARS-Cov-2 infection and should not be used as the sole basis for treatment or other patient management decisions. A negative result may occur with  improper specimen collection/handling, submission of specimen other than nasopharyngeal swab, presence of viral mutation(s) within the areas targeted by this assay, and inadequate number of viral copies(<138 copies/mL). A negative result must be combined with clinical observations, patient history, and epidemiological information. The expected result is Negative.  Fact Sheet for Patients:  EntrepreneurPulse.com.au  Fact Sheet for Healthcare Providers:  IncredibleEmployment.be  This  test is no t yet approved or cleared by the [place] and  has been authorized for detection and/or diagnosis of SARS-CoV-2 by FDA under an Emergency Use Authorization (EUA). This EUA will remain  in effect (meaning this test can be used) for the duration of the COVID-19 declaration under Section 564(b)(1) of the Act, 21 U.S.C.section 360bbb-3(b)(1), unless the authorization is terminated  or revoked sooner.       Influenza A by PCR NEGATIVE NEGATIVE Final   Influenza B by PCR NEGATIVE NEGATIVE Final    Comment: (NOTE) The Xpert Xpress SARS-CoV-2/FLU/RSV plus assay is intended as an aid in the diagnosis of influenza from Nasopharyngeal swab specimens and should not be used as a sole basis for treatment. Nasal washings and aspirates are unacceptable for Xpert Xpress SARS-CoV-2/FLU/RSV testing.  Fact Sheet for Patients: EntrepreneurPulse.com.au  Fact Sheet for Healthcare  Providers: IncredibleEmployment.be  This test is not yet approved or cleared by the [place] FDA and has been authorized for detection and/or diagnosis of SARS-CoV-2 by FDA under an Emergency Use Authorization (EUA). This EUA will remain in effect (meaning this test can be used) for the duration of the COVID-19 declaration under Section 564(b)(1) of the Act, 21 U.S.C. section 360bbb-3(b)(1), unless the authorization is terminated or revoked.  Performed at [facility], [address], [place], [place] [address]          Radiology Studies: DG Chest 2 View  Result Date: 10/10/2020 CLINICAL DATA:  Shortness of breath. History of liver transplant 01 March 2020. End-stage renal disease on dialysis. Weakness. EXAM: CHEST - 2 VIEW COMPARISON:  08/19/2020 FINDINGS: Patient has RIGHT-sided dialysis catheter, tip overlying the level of the LOWER superior vena/UPPER RIGHT atrium. Heart size is normal. There has been improvement in aeration of the LOWER lobes since the previous exam. Nearly resolved bilateral pleural effusions. No edema. Partially imaged probable gastrojejunostomy. IMPRESSION: Improved aeration in the LOWER lobes. Minimal scarring or atelectasis persists at the lung bases. Almost completely resolved bilateral pleural effusions. Electronically Signed   By: Nolon Nations M.D.   On: 10/10/2020 15:32        Scheduled Meds:  aspirin EC  81 mg Oral Daily   atorvastatin  40 mg Oral QHS   Chlorhexidine Gluconate Cloth  6 each Topical Daily   clopidogrel  75 mg Oral Daily   escitalopram  20 mg Oral Daily   gabapentin  100 mg Oral BID   heparin  5,000 Units Subcutaneous Q8H   insulin aspart  0-5 Units Subcutaneous QHS   insulin aspart  0-9 Units Subcutaneous TID WC   melatonin  10 mg Oral QHS   metoCLOPramide  5 mg Oral TID AC   metoprolol tartrate  25 mg Oral BID   mirtazapine  15 mg Oral QHS   mycophenolate  1,000 mg Oral BID    pantoprazole  40 mg Oral Daily   predniSONE  17.5 mg Oral Daily   [START ON 10/13/2020] sulfamethoxazole-trimethoprim  1 tablet Oral Once per day on Mon Wed Fri   tacrolimus  7 mg Oral BID   traZODone  150 mg Oral QHS   valGANciclovir  450 mg Oral Once per day on Mon Thu   Continuous Infusions:   LOS: 1 day    Time spent: 32 minutes    Sharen Hones, MD [facility]   To contact the attending provider between 7A-7P or the covering provider during after hours 7P-7A, please log into the web site  [email or web address] and access using universal [place] password for that web site. If you do not have the password, please call the hospital operator.  10/11/2020, 12:16 PM

## 2020-10-11 NOTE — Progress Notes (Signed)
Pt HD ended at [facility]. 0L uf goal was met. Pt alert, no c/o, vss. CVC dressing changed and biopatch applied (difficulty d/t stitch close to skin). CVC heparin locked w/ 1.63m each port as marked. Report to primary RN. CCMD notified of patient departure from HD unit.

## 2020-10-11 NOTE — Progress Notes (Signed)
HD end 

## 2020-10-11 NOTE — Plan of Care (Signed)
  Problem: Pain Managment: Goal: General experience of comfort will improve Outcome: Progressing   

## 2020-10-11 NOTE — Progress Notes (Signed)
OT Cancellation Note  Patient Details Name: Alex Hampton MRN: [id number] DOB: 03/25/1956   Cancelled Treatment:    Reason Eval/Treat Not Completed: Medical issues which prohibited therapy. Consult received, chart reviewed. Pt noted with critically elevated K+ 6.4 falling outside guidelines for participation with OT services.  Will attempt to see pt at a future date/time as medically appropriate.   Hanley Hays, MPH, MS, OTR/L ascom [phone] 10/11/20, 8:38 AM

## 2020-10-11 NOTE — Progress Notes (Signed)
Pre HD information

## 2020-10-11 NOTE — Progress Notes (Signed)
PHARMACY NOTE:  ANTIMICROBIAL RENAL DOSAGE ADJUSTMENT  Current antimicrobial regimen includes a mismatch between antimicrobial dosage and estimated renal function.  As per policy approved by the Pharmacy & Therapeutics and Medical Executive Committees, the antimicrobial dosage will be adjusted accordingly.  Current antimicrobial dosage:  Bactrim 1 DS TIW  Indication: PJP ppx  Renal Function:  Estimated Creatinine Clearance: 29 mL/min (A) (by C-G formula based on SCr of 2.83 mg/dL (H)). [x]      On intermittent HD: pending RRT decision per nephrology    Antimicrobial dosage has been changed to:  Bactrim 1 SS TIW  Thank you for allowing pharmacy to be a part of this patient's care.  Benita Gutter, [facility] 10/11/2020 10:31 AM

## 2020-10-11 NOTE — Progress Notes (Signed)
PT Cancellation Note  Patient Details Name: JAXSTON CHOHAN MRN: [id number] DOB: [birth year]   Cancelled Treatment:    Reason Eval/Treat Not Completed: Medical issues which prohibited therapy: Pt's recent Ka 6.4 falling outside guidelines for participation with PT services.  Will attempt to see pt at a future date/time as medically appropriate.     Linus Salmons PT, DPT 10/11/20, 8:36 AM

## 2020-10-11 NOTE — Progress Notes (Signed)
HD start 

## 2020-10-11 NOTE — Progress Notes (Signed)
Pre HD RN assessment 

## 2020-10-11 NOTE — Progress Notes (Signed)
Date and time results received: 10/11/20 0635 (use smartphrase ".now" to insert current time)  Test: potassium  Critical Value: 6.4  Name of Provider Notified: Eugenie Norrie  Orders Received? Or Actions Taken?:  pending

## 2020-10-11 NOTE — TOC Initial Note (Signed)
Transition of Care [facility]) - Initial/Assessment Note    Patient Details  Name: Alex Hampton MRN: [id number] Date of Birth: [birth year]  [facility]) CM/SW Contact:    Alberteen Sam, LCSW Phone Number: 10/11/2020, 11:25 AM  Clinical Narrative:                  CSW met with patient at bedside to complete readmission risk assessment. Patient reports he lives at home with his son and a part time caregiver. Patient reports his son helps him with transportation to and from appointments, he has a PCP and no problems getting his medications. Patient reports [facility] [address]. Reports he does not think he needs [facility] services as he is cared for by caregiver and son, already has a walker, lift chair and 3in1 at home. Does request DME tub bench.   CSW will arrange for DME tub bench to be delivered to room prior to dc via [employer], will need DME tub bench orders prior to dc. Son to pick up when patient medically stable to return home.    Expected Discharge Plan: Home/Self Care Barriers to Discharge: Continued Medical Work up   Patient Goals and CMS Choice Patient states their goals for this hospitalization and ongoing recovery are:: to go home CMS Medicare.gov Compare Post Acute Care list provided to:: Patient Choice offered to / list presented to : Patient  Expected Discharge Plan and Services Expected Discharge Plan: Home/Self Care       Living arrangements for the past 2 months: Single Family Home                 DME Arranged: Tub bench DME Agency: [employer]                  Prior Living Arrangements/Services Living arrangements for the past 2 months: Single Family Home Lives with:: Adult Children, Other (Comment) (son and caregiver in the home)   Do you feel safe going back to the place where you live?: Yes        Care giver support system in place?: Yes (comment)      Activities of Daily Living Home Assistive Devices/Equipment: Gilford Rile  (specify type) ADL Screening (condition at time of admission) Patient's cognitive ability adequate to safely complete daily activities?: Yes Is the patient deaf or have difficulty hearing?: No Does the patient have difficulty seeing, even when wearing glasses/contacts?: No Does the patient have difficulty concentrating, remembering, or making decisions?: No Patient able to express need for assistance with ADLs?: Yes Does the patient have difficulty dressing or bathing?: Yes Independently performs ADLs?: No Communication: Independent Dressing (OT): Needs assistance Is this a change from baseline?: Pre-admission baseline Grooming: Needs assistance Is this a change from baseline?: Pre-admission baseline Feeding: Independent Bathing: Needs assistance Is this a change from baseline?: Pre-admission baseline Toileting: Needs assistance Is this a change from baseline?: Pre-admission baseline In/Out Bed: Needs assistance Is this a change from baseline?: Pre-admission baseline Walks in Home: Needs assistance Is this a change from baseline?: Pre-admission baseline Does the patient have difficulty walking or climbing stairs?: Yes Weakness of Legs: Both Weakness of Arms/Hands: Both  Permission Sought/Granted                  Emotional Assessment Appearance:: Appears stated age Attitude/Demeanor/Rapport: Gracious Affect (typically observed): Calm Orientation: : Oriented to Self, Oriented to Place, Oriented to  Time, Oriented to Situation Alcohol / Substance Use:  Not Applicable Psych Involvement: No (comment)  Admission diagnosis:  Uremia [N19] Weakness [R53.1] Elevated BUN [R79.9] Patient Active Problem List   Diagnosis Date Noted   Uremia 10/10/2020   Anemia in ESRD (end-stage renal disease) ([place]) 05/01/2409   Acute metabolic encephalopathy 46/43/1427   Hyperkalemia 10/10/2020   Elevated troponin 10/10/2020   GERD (gastroesophageal reflux disease) 10/10/2020   CAD (coronary  artery disease) 10/10/2020   G tube feedings ([place]) 10/10/2020   Diarrhea 10/10/2020   Type II diabetes mellitus with renal manifestations ([place]) 10/10/2020   Weakness    ACS (acute coronary syndrome) ([place])    Liver transplant recipient [facility])    Anemia of chronic disease    Type 2 diabetes mellitus with diabetic neuropathy, with long-term current use of insulin ([place])    Chest pain 08/19/2020   ESRD (end stage renal disease) ([place]) 08/19/2020   History of anemia due to chronic kidney disease 08/19/2020   Gait abnormality 10/17/2019   History of fall within past 90 days 10/17/2019   Hallucinations 10/17/2019   Cirrhosis of liver with ascites ([place]) on CT 10/17/2019   HTN (hypertension) 10/17/2019   History of MI (myocardial infarction) 10/17/2019   OSA (obstructive sleep apnea) 10/17/2019   Nondisplaced comminuted supracondylar fracture without intercondylar fracture of left humerus, subsequent encounter for fracture with nonunion 10/17/2019   Anxiety and depression 10/17/2019   Confusion and disorientation 10/17/2019   PCP:  Juluis Pitch, MD Pharmacy:   [id number] [place] [id number]: [phone]: [phone]    Social Determinants of Health (SDOH) Interventions    Readmission Risk Interventions No flowsheet data found.

## 2020-10-11 NOTE — Progress Notes (Signed)
[facility]  ROUNDING NOTE   Subjective:  Patient well-known to [place] as we follow him for acute kidney injury. He was on hemodialysis however recently we took him off as his creatinine clearance was above 15. Patient had recent liver transplantation. BUN currently 99 with a creatinine of 2.83. However potassium also high at 6.4. Suspect that the EGFR of 24 is an overestimation in his case.   Objective:  Vital signs in last 24 hours:  Temp:  [97.2 F (36.2 C)-98.1 F (36.7 C)] 97.2 F (36.2 C) (07/04 1145) Pulse Rate:  [56-98] 62 (07/04 1145) Resp:  [16-20] 17 (07/04 1145) BP: (137-165)/(74-96) 144/96 (07/04 1145) SpO2:  [100 %] 100 % (07/04 1145)  Weight change:  Filed Weights   10/10/20 1014  Weight: 79.9 kg    Intake/Output: I/O last 3 completed shifts: In: -  Out: 300 [Urine:300]   Intake/Output this shift:  Total I/O In: 380 [P.O.:380] Out: 250 [Urine:250]  Physical Exam: General: No acute distress  Head: Normocephalic, atraumatic. Moist oral mucosal membranes  Eyes: Anicteric  Neck: Supple  Lungs:  Clear to auscultation, normal effort  Heart: S1S2 no rubs  Abdomen:  Soft, nontender, bowel sounds present  Extremities: Trace peripheral edema.  Neurologic: Awake, alert, following commands  Skin: No acute rash  Access: Right IJ PermCath    Basic Metabolic Panel: Recent Labs  Lab 10/10/20 1016 10/11/20 0427  NA 131* 132*  K 5.7* 6.4*  CL 98 100  CO2 23 22  GLUCOSE 195* 154*  BUN 102* 99*  CREATININE 2.96* 2.83*  CALCIUM 10.1 10.3    Liver Function Tests: Recent Labs  Lab 10/10/20 1016  AST 18  ALT 13  ALKPHOS 92  BILITOT 0.5  PROT 7.4  ALBUMIN 4.0   No results for input(s): LIPASE, AMYLASE in the last 168 hours. Recent Labs  Lab 10/10/20 1500  AMMONIA 9    CBC: Recent Labs  Lab 10/10/20 1016 10/11/20 0427  WBC 3.8* 3.3*  HGB 9.0* 8.5*  HCT 26.6* 24.8*  MCV 101.9* 97.3  PLT 161 157    Cardiac Enzymes: No  results for input(s): CKTOTAL, CKMB, CKMBINDEX, TROPONINI in the last 168 hours.  BNP: Invalid input(s): POCBNP  CBG: Recent Labs  Lab 10/10/20 2349 10/11/20 0833 10/11/20 1147  GLUCAP 231* 141* 113*    Microbiology: Results for orders placed or performed during the hospital encounter of 10/10/20  Resp Panel by RT-PCR (Flu A&B, Covid) Nasopharyngeal Swab     Status: None   Collection Time: 10/10/20  2:45 PM   Specimen: Nasopharyngeal Swab; Nasopharyngeal(NP) swabs in vial transport medium  Result Value Ref Range Status   SARS Coronavirus 2 by RT PCR NEGATIVE NEGATIVE Final    Comment: (NOTE) SARS-CoV-2 target nucleic acids are NOT DETECTED.  The SARS-CoV-2 RNA is generally detectable in upper respiratory specimens during the acute phase of infection. The lowest concentration of SARS-CoV-2 viral copies this assay can detect is 138 copies/mL. A negative result does not preclude SARS-Cov-2 infection and should not be used as the sole basis for treatment or other patient management decisions. A negative result may occur with  improper specimen collection/handling, submission of specimen other than nasopharyngeal swab, presence of viral mutation(s) within the areas targeted by this assay, and inadequate number of viral copies(<138 copies/mL). A negative result must be combined with clinical observations, patient history, and epidemiological information. The expected result is Negative.  Fact Sheet for Patients:  EntrepreneurPulse.com.au  Fact Sheet for Healthcare  Providers:  IncredibleEmployment.be  This test is no t yet approved or cleared by the [place] and  has been authorized for detection and/or diagnosis of SARS-CoV-2 by FDA under an Emergency Use Authorization (EUA). This EUA will remain  in effect (meaning this test can be used) for the duration of the COVID-19 declaration under Section 564(b)(1) of the Act,  21 U.S.C.section 360bbb-3(b)(1), unless the authorization is terminated  or revoked sooner.       Influenza A by PCR NEGATIVE NEGATIVE Final   Influenza B by PCR NEGATIVE NEGATIVE Final    Comment: (NOTE) The Xpert Xpress SARS-CoV-2/FLU/RSV plus assay is intended as an aid in the diagnosis of influenza from Nasopharyngeal swab specimens and should not be used as a sole basis for treatment. Nasal washings and aspirates are unacceptable for Xpert Xpress SARS-CoV-2/FLU/RSV testing.  Fact Sheet for Patients: EntrepreneurPulse.com.au  Fact Sheet for Healthcare Providers: IncredibleEmployment.be  This test is not yet approved or cleared by the [place] FDA and has been authorized for detection and/or diagnosis of SARS-CoV-2 by FDA under an Emergency Use Authorization (EUA). This EUA will remain in effect (meaning this test can be used) for the duration of the COVID-19 declaration under Section 564(b)(1) of the Act, 21 U.S.C. section 360bbb-3(b)(1), unless the authorization is terminated or revoked.  Performed at [facility], [address] [place], [place] [address]     Coagulation Studies: Recent Labs    10/10/20 1500  LABPROT 14.0  INR 1.1    Urinalysis: No results for input(s): COLORURINE, LABSPEC, PHURINE, GLUCOSEU, HGBUR, BILIRUBINUR, KETONESUR, PROTEINUR, UROBILINOGEN, NITRITE, LEUKOCYTESUR in the last 72 hours.  Invalid input(s): APPERANCEUR    Imaging: DG Chest 2 View  Result Date: 10/10/2020 CLINICAL DATA:  Shortness of breath. History of liver transplant 01 March 2020. End-stage renal disease on dialysis. Weakness. EXAM: CHEST - 2 VIEW COMPARISON:  08/19/2020 FINDINGS: Patient has RIGHT-sided dialysis catheter, tip overlying the level of the LOWER superior vena/UPPER RIGHT atrium. Heart size is normal. There has been improvement in aeration of the LOWER lobes since the previous exam. Nearly resolved bilateral  pleural effusions. No edema. Partially imaged probable gastrojejunostomy. IMPRESSION: Improved aeration in the LOWER lobes. Minimal scarring or atelectasis persists at the lung bases. Almost completely resolved bilateral pleural effusions. Electronically Signed   By: Nolon Nations M.D.   On: 10/10/2020 15:32     Medications:     aspirin EC  81 mg Oral Daily   atorvastatin  40 mg Oral QHS   Chlorhexidine Gluconate Cloth  6 each Topical Daily   clopidogrel  75 mg Oral Daily   escitalopram  20 mg Oral Daily   gabapentin  100 mg Oral BID   heparin  5,000 Units Subcutaneous Q8H   insulin aspart  0-5 Units Subcutaneous QHS   insulin aspart  0-9 Units Subcutaneous TID WC   melatonin  10 mg Oral QHS   metoCLOPramide  5 mg Oral TID AC   metoprolol tartrate  25 mg Oral BID   mirtazapine  15 mg Oral QHS   mycophenolate  1,000 mg Oral BID   pantoprazole  40 mg Oral Daily   predniSONE  17.5 mg Oral Daily   [START ON 10/13/2020] sulfamethoxazole-trimethoprim  1 tablet Oral Once per day on Mon Wed Fri   tacrolimus  7 mg Oral BID   traZODone  150 mg Oral QHS   valGANciclovir  450 mg Oral Once per day on Mon Thu   acetaminophen,  albuterol, dextromethorphan-guaiFENesin, hydrALAZINE, hydrOXYzine, nitroGLYCERIN, ondansetron (ZOFRAN) IV, senna-docusate  Assessment/ Plan:  65 y.o. male with past medical history of liver transplant on 02/18/2020 at [facility], hypertension, coronary artery disease, hyperlipidemia, [name] with cirrhosis, acute kidney injury requiring hemodialysis, history of tracheostomy placement, history of pancreatic pseudocyst, history of chyle leak status post drain placement, history of bacteremia with Staphylococcus hemolyticus who presents now with worsening uremic symptoms.  1.  Acute kidney injury/chronic kidney disease stage IV/hyperkalemia.  Patient found to have hyperkalemia along with hyponatremia as well as metabolic encephalopathy.  He recently took him off  of dialysis as he had a creatinine clearance of greater than 15.  However it appears that these estimations may actually be over estimations of his true renal function.  Therefore we will reinitiate dialysis at this time with a blood flow rate of 200 and dialysate flow rate of 500 time of 2 hours.  We will plan for dialysis today as well as tomorrow.  Suspect that the patient most likely has ESRD.  2.  Anemia of chronic kidney disease.  Hemoglobin 8.5.  Consider starting the patient back on Epogen this admission.  3.  Status post liver transplant 02/18/2020.  Patient will be maintained on tacrolimus, prednisone, and mycophenolate at their current doses.  Further plan as patient progresses.   LOS: 1 Munsoor Lateef 7/4/202212:59 PM

## 2020-10-12 LAB — CBC WITH DIFFERENTIAL/PLATELET
Abs Immature Granulocytes: 0.19 10*3/uL — ABNORMAL HIGH (ref 0.00–0.07)
Basophils Absolute: 0 10*3/uL (ref 0.0–0.1)
Basophils Relative: 1 %
Eosinophils Absolute: 0.1 10*3/uL (ref 0.0–0.5)
Eosinophils Relative: 2 %
HCT: 24.4 % — ABNORMAL LOW (ref 39.0–52.0)
Hemoglobin: 8.5 g/dL — ABNORMAL LOW (ref 13.0–17.0)
Immature Granulocytes: 7 %
Lymphocytes Relative: 10 %
Lymphs Abs: 0.3 10*3/uL — ABNORMAL LOW (ref 0.7–4.0)
MCH: 35 pg — ABNORMAL HIGH (ref 26.0–34.0)
MCHC: 34.8 g/dL (ref 30.0–36.0)
MCV: 100.4 fL — ABNORMAL HIGH (ref 80.0–100.0)
Monocytes Absolute: 0.4 10*3/uL (ref 0.1–1.0)
Monocytes Relative: 14 %
Neutro Abs: 1.8 10*3/uL (ref 1.7–7.7)
Neutrophils Relative %: 66 %
Platelets: 161 10*3/uL (ref 150–400)
RBC: 2.43 MIL/uL — ABNORMAL LOW (ref 4.22–5.81)
RDW: 13.9 % (ref 11.5–15.5)
Smear Review: NORMAL
WBC: 2.7 10*3/uL — ABNORMAL LOW (ref 4.0–10.5)
nRBC: 0.7 % — ABNORMAL HIGH (ref 0.0–0.2)

## 2020-10-12 LAB — URINE DRUG SCREEN, QUALITATIVE (ARMC ONLY)
Amphetamines, Ur Screen: NOT DETECTED
Barbiturates, Ur Screen: NOT DETECTED
Benzodiazepine, Ur Scrn: NOT DETECTED
Cannabinoid 50 Ng, Ur ~~LOC~~: NOT DETECTED
Cocaine Metabolite,Ur ~~LOC~~: NOT DETECTED
MDMA (Ecstasy)Ur Screen: NOT DETECTED
Methadone Scn, Ur: NOT DETECTED
Opiate, Ur Screen: NOT DETECTED
Phencyclidine (PCP) Ur S: NOT DETECTED
Tricyclic, Ur Screen: NOT DETECTED

## 2020-10-12 LAB — URINALYSIS, COMPLETE (UACMP) WITH MICROSCOPIC
Bacteria, UA: NONE SEEN
Bilirubin Urine: NEGATIVE
Glucose, UA: NEGATIVE mg/dL
Hgb urine dipstick: NEGATIVE
Ketones, ur: NEGATIVE mg/dL
Leukocytes,Ua: NEGATIVE
Nitrite: NEGATIVE
Protein, ur: NEGATIVE mg/dL
Specific Gravity, Urine: 1.013 (ref 1.005–1.030)
Squamous Epithelial / HPF: NONE SEEN (ref 0–5)
pH: 5 (ref 5.0–8.0)

## 2020-10-12 LAB — BASIC METABOLIC PANEL
Anion gap: 8 (ref 5–15)
BUN: 75 mg/dL — ABNORMAL HIGH (ref 8–23)
CO2: 26 mmol/L (ref 22–32)
Calcium: 9.1 mg/dL (ref 8.9–10.3)
Chloride: 97 mmol/L — ABNORMAL LOW (ref 98–111)
Creatinine, Ser: 2.55 mg/dL — ABNORMAL HIGH (ref 0.61–1.24)
GFR, Estimated: 27 mL/min — ABNORMAL LOW (ref >60)
Glucose, Bld: 222 mg/dL — ABNORMAL HIGH (ref 70–99)
Potassium: 5.9 mmol/L — ABNORMAL HIGH (ref 3.5–5.1)
Sodium: 131 mmol/L — ABNORMAL LOW (ref 135–145)

## 2020-10-12 LAB — GLUCOSE, CAPILLARY
Glucose-Capillary: 187 mg/dL — ABNORMAL HIGH (ref 70–99)
Glucose-Capillary: 195 mg/dL — ABNORMAL HIGH (ref 70–99)
Glucose-Capillary: 163 mg/dL — ABNORMAL HIGH (ref 70–99)
Glucose-Capillary: 153 mg/dL — ABNORMAL HIGH (ref 70–99)

## 2020-10-12 LAB — IRON AND TIBC
Iron: 151 ug/dL (ref 45–182)
Saturation Ratios: 46 % — ABNORMAL HIGH (ref 17.9–39.5)
TIBC: 332 ug/dL (ref 250–450)
UIBC: 181 ug/dL

## 2020-10-12 LAB — HEMOGLOBIN A1C
Hgb A1c MFr Bld: 6.4 % — ABNORMAL HIGH (ref 4.8–5.6)
Mean Plasma Glucose: 137 mg/dL

## 2020-10-12 LAB — HEPATITIS B CORE ANTIBODY, TOTAL: Hep B Core Total Ab: NONREACTIVE

## 2020-10-12 LAB — VITAMIN B12: Vitamin B-12: 959 pg/mL — ABNORMAL HIGH (ref 180–914)

## 2020-10-12 LAB — PHOSPHORUS: Phosphorus: 4.1 mg/dL (ref 2.5–4.6)

## 2020-10-12 LAB — MAGNESIUM: Magnesium: 1.8 mg/dL (ref 1.7–2.4)

## 2020-10-12 MED ORDER — EPOETIN ALFA 10000 UNIT/ML IJ SOLN
4000.0000 [IU] | Freq: Once | INTRAMUSCULAR | Status: AC
  Administered 2020-10-13: 4000 [IU] via INTRAVENOUS

## 2020-10-12 MED ORDER — FREE WATER: 30.0000 mL | Status: DC

## 2020-10-12 MED ORDER — NEPRO/CARBSTEADY PO LIQD: 1190.0000 mL | ORAL | Status: DC

## 2020-10-12 MED ORDER — RENA-VITE PO TABS
1.0000 | ORAL_TABLET | Freq: Every day | ORAL | Status: DC
  Administered 2020-10-13: 1 via ORAL
  Filled 2020-10-12 (×3): qty 1

## 2020-10-12 NOTE — Progress Notes (Signed)
HD initiated at 1438 via CVC. VSS throughout treatment UF goal of 0L achieved.  Patient offered no c/o, HD ended at 1709, report given to floor RN, patient transported to room in stable condition

## 2020-10-12 NOTE — Evaluation (Signed)
Occupational Therapy Evaluation Patient Details Name: Alex Hampton MRN: [id number] DOB: June 12, 1955 Today's Date: 10/12/2020    History of Present Illness Per MD Notes: Alex Hampton is a 65 y.o. male with medical history significant of hypertension, hyperlipidemia, GERD, depression with anxiety, CAD, stent placement, anemia, liver cirrhosis with ascites (s/p of liver transplantation at [facility] in 2021, on immunosuppressant), ESRD, OSA not on CPAP, s/p of G -tube, who presents with generalized weakness and diarrhea. Upon arriving the emergency room, he was found to have significant hyperkalemia.  Potassium went up to 6.4.  Creatinine 2.83.  Nephrology following.   Clinical Impression   Mr. Rohrbach was seen for OT evaluation this date. RN cleared pt for bed-level OT session this date 2/2 increased K+ values (5.9 as of most recent lab reading available prior to OT evaluation). Prior to hospital admission, pt was modified independent for ADL management. He endorses living with round-the clock in-home nsg assistance, but states he is able to complete basic self-care tasks including bathing, dressing, and light meal prep independently at baseline. Currently pt demonstrates impairments as described below (See OT problem list) which functionally limit his ability to perform ADL/self-care tasks. Pt requires MIN-MOD A for exertional ADL management including LB bathing and dressing. Functional mobility deferred for pt safety, however pt is able to roll L<>R with mod I this date. Pt would benefit from skilled OT services to address noted impairments and functional limitations (see below for any additional details) in order to maximize safety and independence while minimizing falls risk and caregiver burden. Pt voices interest in HEP for UE therapeutic exercise. Will plan to address at next session. Upon hospital discharge, recommend STR to maximize pt safety and return to PLOF at this time. Will continue to monitor as pt  stabilizes medically.      Follow Up Recommendations  SNF;Other (comment) (Will continue to monitor as pt progresses.)    Equipment Recommendations  Tub/shower bench    Recommendations for Other Services       Precautions / Restrictions Precautions Precautions: Fall Restrictions Weight Bearing Restrictions: No      Mobility Bed Mobility Overal bed mobility: Modified Independent Bed Mobility: Rolling Rolling: Modified independent (Device/Increase time)         General bed mobility comments: Pt able to roll L<>R in bed with min increased time/effort to perform. Is able to boost self up in bed without physical assist, HOB flat.    Transfers                 General transfer comment: Deferred for pt safety.    Balance Overall balance assessment: Needs assistance                                         ADL either performed or assessed with clinical judgement   ADL Overall ADL's : Needs assistance/impaired Eating/Feeding: Set up;Sitting   Grooming: Set up;Supervision/safety;Sitting   Upper Body Bathing: Minimal assistance;Sitting   Lower Body Bathing: Moderate assistance;Sitting/lateral leans   Upper Body Dressing : Minimal assistance;Sitting           Toileting- Clothing Manipulation and Hygiene: Bed level;Independent Toileting - Clothing Manipulation Details (indicate cue type and reason): Pt able to independently use urinal at bed level this date. Anticipate increased assist for toilet transfers/toileting not at bed level.       General ADL Comments: Functionally mobility  deferred until pt medically stable (K+ 5.9 at time of OT evaluation). RN clears for bed-level participation in ADL management. Pt is able to complete bed-leve toileting independently. Requires set-up of materials for additional bed-level ADL management. Anticipate min-mod A for exertional tasks including LB bathing and dressing.     Vision Baseline Vision/History:  Wears glasses Wears Glasses: At all times Patient Visual Report: Diplopia;No change from baseline Additional Comments: Pt reports hx of double vision, improves when he adjusts his glasses.     Perception     Praxis      Pertinent Vitals/Pain Pain Assessment: 0-10 Pain Score: 4  Pain Location: Stomach discomfort Pain Descriptors / Indicators: Discomfort Pain Intervention(s): Limited activity within patient's tolerance;Monitored during session     Hand Dominance Right   Extremity/Trunk Assessment Upper Extremity Assessment Upper Extremity Assessment: Generalized weakness (BUE generally weak with no focal weakness appreciated. Decreased AROM of L shoulder 2/2 hx of fx 1 year prior. Shoulder flexion to ~80 this date.)   Lower Extremity Assessment Lower Extremity Assessment: Generalized weakness       Communication Communication Communication: No difficulties   Cognition Arousal/Alertness: Awake/alert Behavior During Therapy: WFL for tasks assessed/performed Overall Cognitive Status: Within Functional Limits for tasks assessed                                 General Comments: Pt pleasant & agreeable t/o session. Follows VCs consistently.   General Comments       Exercises Other Exercises Other Exercises: Pt educated on role of OT in acute setting, safe use of AE/DME to support ADL management/energy conservation, falls prevention strategies for home and hospital, and considerations for DC to STR vs. HH this date.   Shoulder Instructions      Home Living Family/patient expects to be discharged to:: Private residence Living Arrangements: Other (Comment) (Pt reports he has "live-in" nursing assistance.) Available Help at Discharge: Family;Available 24 hours/day;Home health         Home Layout: One level     Bathroom Shower/Tub: Teacher, early years/pre: Standard     Home Equipment: Environmental consultant - 4 wheels;Bedside commode;Grab bars - tub/shower           Prior Functioning/Environment Level of Independence: Needs assistance  Gait / Transfers Assistance Needed: Uses 4WW for functional mobility in the home. Does not get out much but uses 4WW to get to/from Dr.'s apts etc. ADL's / Homemaking Assistance Needed: Pt reports he is mod I (using 4WW/BSC) for ADL management. Pt states he dresses himself independently, sponge bathes, and cooks light meals. PCA's assist with cleaning, laundry, etc.            OT Problem List: Decreased strength;Decreased coordination;Decreased range of motion;Decreased activity tolerance;Decreased safety awareness;Impaired balance (sitting and/or standing);Decreased knowledge of use of DME or AE;Impaired UE functional use      OT Treatment/Interventions: Self-care/ADL training;Therapeutic exercise;Therapeutic activities;DME and/or AE instruction;Patient/family education;Balance training;Energy conservation    OT Goals(Current goals can be found in the care plan section) Acute Rehab OT Goals Patient Stated Goal: To feel better and go home OT Goal Formulation: With patient Time For Goal Achievement: 10/26/20 Potential to Achieve Goals: Good ADL Goals Pt Will Perform Grooming: sitting;with set-up;with supervision Pt Will Transfer to Toilet: ambulating;bedside commode;with supervision;with set-up Pt Will Perform Toileting - Clothing Manipulation and hygiene: sit to/from stand;with modified independence Additional ADL Goal #1: Pt will independently return verbalize  understanding of UE HEP prior to DC.  OT Frequency: Min 2X/week   Barriers to D/C: Decreased caregiver support          Co-evaluation              AM-PAC OT "6 Clicks" Daily Activity     Outcome Measure Help from another person eating meals?: None Help from another person taking care of personal grooming?: A Little Help from another person toileting, which includes using toliet, bedpan, or urinal?: A Little Help from another person  bathing (including washing, rinsing, drying)?: A Lot Help from another person to put on and taking off regular upper body clothing?: A Little Help from another person to put on and taking off regular lower body clothing?: A Lot 6 Click Score: 17   End of Session Nurse Communication: Mobility status (200 cc's urine output during session. Urinal dumped and left for pt at bedside.)  Activity Tolerance: Treatment limited secondary to medical complications (Comment) (K+ 5.9, bed-level activity only to minimize exertion.) Patient left: in bed;with call bell/phone within reach;with bed alarm set  OT Visit Diagnosis: Other abnormalities of gait and mobility (R26.89);Muscle weakness (generalized) (M62.81)                Time: 1594-7076 OT Time Calculation (min): 26 min Charges:  OT General Charges $OT Visit: 1 Visit OT Evaluation $OT Eval Moderate Complexity: 1 Mod OT Treatments $Self Care/Home Management : 8-22 mins  Shara Blazing, M.S., OTR/L Ascom: [phone] 10/12/20, 10:18 AM

## 2020-10-12 NOTE — Progress Notes (Signed)
   10/11/20 1532 10/11/20 1545 10/11/20 1600  Vitals  BP 140/78 (!) 153/111 127/82  MAP (mmHg) 96 107 94  Pulse Rate (!) 58 (!) 57 (!) 59  ECG Heart Rate (!) 57 (!) 57 (!) 58  Resp (!) 23 (!) 25 (!) 24  During Hemodialysis Assessment  Blood Flow Rate (mL/min) 200 mL/min 200 mL/min 200 mL/min  Arterial Pressure (mmHg) -80 mmHg -100 mmHg -100 mmHg  Venous Pressure (mmHg) 10 mmHg 30 mmHg 30 mmHg  Transmembrane Pressure (mmHg) 60 mmHg 80 mmHg 70 mmHg  Ultrafiltration Rate (mL/min) 250 mL/min 250 mL/min 250 mL/min  Dialysate Flow Rate (mL/min) 500 ml/min 500 ml/min 500 ml/min  Conductivity: Machine  13.9 13.9 14  HD Safety Checks Performed Yes Yes Yes  Dialysis Fluid Bolus Normal Saline  --   --   Bolus Amount (mL) 200 mL  --   --   Dialysate Change 2K  --   --   Intra-Hemodialysis Comments Tx initiated;Progressing as prescribed (Pt alert, no c/o, stable, access visible) Progressing as prescribed (Pt alert, no c/o, stable, access visible, playing games on phone) Progressing as prescribed (Pt alert, stable, access visible, ufr 124)    10/11/20 1615 10/11/20 1630 10/11/20 1645  Vitals  BP 118/84 112/70 114/71  MAP (mmHg) 95 84 84  Pulse Rate (!) 59 (!) 59 60  ECG Heart Rate (!) 59 (!) 58 (!) 59  Resp (!) 21 (!) 23 20  During Hemodialysis Assessment  Blood Flow Rate (mL/min) 200 mL/min 200 mL/min 200 mL/min  Arterial Pressure (mmHg) -110 mmHg -100 mmHg -100 mmHg  Venous Pressure (mmHg) 30 mmHg 20 mmHg 30 mmHg  Transmembrane Pressure (mmHg) 80 mmHg 70 mmHg 70 mmHg  Ultrafiltration Rate (mL/min) 250 mL/min 250 mL/min 250 mL/min  Dialysate Flow Rate (mL/min) 500 ml/min 500 ml/min 500 ml/min  Conductivity: Machine  14 14 14   HD Safety Checks Performed Yes Yes Yes  Dialysis Fluid Bolus  --   --   --   Bolus Amount (mL)  --   --   --   Dialysate Change  --   --   --   Intra-Hemodialysis Comments Progressing as prescribed (Pt alert, no c/o, on phone, access visible, ufr 178) Progressing  as prescribed (Pt alert, on phone, no c/o, stable, access visible, ufr 230) Progressing as prescribed (Pt alert, stable, talking w/ RN, no c/o, ufr 290)

## 2020-10-12 NOTE — Progress Notes (Signed)
Initial Nutrition Assessment  DOCUMENTATION CODES:   Not applicable  INTERVENTION:   Nocturnal tube feeds of Nepro 1.8 @ 43ml/hr x 14hrs (1800-0800)  Free water flushes 57ml q4 hours to maintain tube patency   Regimen provides 2142kcal/day, 96g/day protein and 1074ml/day free water   Rena-vit daily via tube   NUTRITION DIAGNOSIS:   Inadequate oral intake related to chronic illness as evidenced by other (comment) ([name])  GOAL:   Patient will meet greater than or equal to 90% of their needs  MONITOR:   PO intake, Supplement acceptance, Labs, Weight trends, TF tolerance, Skin, I & O's  REASON FOR ASSESSMENT:   Consult Enteral/tube feeding initiation and management  ASSESSMENT:   65 y.o. male with medical history significant of hypertension, hyperlipidemia, GERD, depression with anxiety, CAD, stent placement, NASH liver cirrhosis with ascites (s/p liver transplant on 02/18/20; hospital course complicated by AKI requring HD, respiratory failure requiring prolonged intubation and trach placement, cardiac arrest, Klebsiella HAP, malnutrition s/p G-J tube placement on 03/03/20 with slow gastric motility requiring indefinite G tube venting, pancreatic pseudocyst s/p AXIOS stent placement 11/14 (removed 05/14/20), chyle leak requiring drain placement, anemia requiring intermittent transfusions and left humeral spiral fracture following mechanical fall s/p ORIF 03/15/20), ESRD on HD, OSA not on CPAP and DM who presents with generalized weakness and diarrhea.  RD working remotely.  Spoke with pt via phone. Pt reports that he is feeling tired and "run down" today. Pt with G-J tube in place since November 2021. Pt reports that tube was placed r/t weight loss and his inability to eat enough to maintain good nutritional status. Pt reports that he currently uses Novasource renal at home. Pt is on nocturnal feeds of 48ml/hr x 12-13 hrs. Pt reports that he gets a total of 977ml  of tube feeds which provides 1900kcal/da and 88g/day protein. Pt reports that he does eat some food by mouth but reports early satiety and inability to eat more than 25% of meals. Pt reports that he does not tolerate supplements as they make him vomit. Pt reports eating 25% of his breakfast this morning. Per chart, pt appears to have lost from 233lbs to 143lbs from July of 2021-November 2021; this is a 90lb(39%) weight loss which is severe. Pt does appear to have regained 25lbs from November 2022 to June 2022 but does appear to be down 10lbs(6%) over the past month; pt does endorse this weight loss. RD discussed with pt the importance of adequate nutrition needed to maintain his weight; will plan to increase pt's calorie and protein intake. RD will resume nocturnal tube feeds; will use Nepro as Novasource is not on formulary at [facility]. Pt is at high risk for malnutrition. RD will obtain NFPE at follow-up.   Medications reviewed and include: aspirin, plavix, heparin, insulin, melatonin, reglan, remeron, protonix, prednisone, bactrim  Labs reviewed: Na 131(L), K 5.9(H), BUN 75(H), creat 2.55(H), P 4.1 wnl, Mg 1.8 wnl Wbc- 2.7(L), Hgb 8.5(L), Hct 24.4(L), MCV 100.4(H), MCH 35.0(H) Cbgs- 187 AIC 6.4(H)- 7/4  NUTRITION - FOCUSED PHYSICAL EXAM: Unable to perform at this time   Diet Order:   Diet Order             Diet renal with fluid restriction Fluid restriction: 1200 mL Fluid; Room service appropriate? Yes; Fluid consistency: Thin  Diet effective now                  EDUCATION NEEDS:   Education needs have been  addressed  Skin:  Skin Assessment: Reviewed RN Assessment  Last BM:  7/3  Height:   Ht Readings from Last 1 Encounters:  10/10/20 6\' 1"  (1.854 m)    Weight:   Wt Readings from Last 1 Encounters:  10/10/20 79.9 kg    Ideal Body Weight:  83.6 kg  BMI:  Body mass index is 23.24 kg/m.  Estimated Nutritional Needs:   Kcal:  2200-2500kcal/day  Protein:   110-125g/day  Fluid:  UOP +1L  Koleen Distance MS, RD, LDN Please refer to [facility] for RD and/or RD on-call/weekend/after hours pager

## 2020-10-12 NOTE — [place] Notes (Signed)
[facility]  ROUNDING NOTE   Subjective:  Patient well-known to [place] as we follow him for acute kidney injury. He was on hemodialysis however recently we took him off as his creatinine clearance was above 15. Patient had recent liver transplantation. BUN currently 99 with a creatinine of 2.83. However potassium also high at 6.4. Suspect that the EGFR of 24 is an overestimation in his case.  Patient seen and evaluated at bedside Received dialysis treatment yesterday Tolerated well Denies shortness of breath and fatigue Creatinine 2.55 today, potassium 5.9  Objective:  Vital signs in last 24 hours:  Temp:  [97.7 F (36.5 C)-98.4 F (36.9 C)] 98 F (36.7 C) (07/05 1158) Pulse Rate:  [57-90] 64 (07/05 1158) Resp:  [16-25] 17 (07/05 1158) BP: (112-153)/(65-111) 148/84 (07/05 1158) SpO2:  [98 %-100 %] 98 % (07/05 1158)  Weight change:  Filed Weights   10/10/20 1014  Weight: 79.9 kg    Intake/Output: I/O last 3 completed shifts: In: 943.8 [P.O.:740; NG/GT:203.8] Out: 700 [Urine:700]   Intake/Output this shift:  Total I/O In: 240 [P.O.:240] Out: 200 [Urine:200]  Physical Exam: General: No acute distress  Head: Normocephalic, atraumatic. Moist oral mucosal membranes  Eyes: Anicteric  Lungs:  Clear to auscultation, normal effort  Heart: S1S2 no rubs  Abdomen:  Soft, nontender, bowel sounds present  Extremities: Trace peripheral edema.  Neurologic: Awake, alert, following commands  Skin: No acute rash  Access: Right IJ PermCath    Basic Metabolic Panel: Recent Labs  Lab 10/10/20 1016 10/11/20 0427 10/11/20 1447 10/12/20 0447  NA 131* 132*  --  131*  K 5.7* 6.4*  --  5.9*  CL 98 100  --  97*  CO2 23 22  --  26  GLUCOSE 195* 154*  --  222*  BUN 102* 99*  --  75*  CREATININE 2.96* 2.83*  --  2.55*  CALCIUM 10.1 10.3  --  9.1  MG  --   --   --  1.8  PHOS  --   --  5.4* 4.1     Liver Function Tests: Recent Labs  Lab 10/10/20 1016  AST 18   ALT 13  ALKPHOS 92  BILITOT 0.5  PROT 7.4  ALBUMIN 4.0    No results for input(s): LIPASE, AMYLASE in the last 168 hours. Recent Labs  Lab 10/10/20 1500  AMMONIA 9     CBC: Recent Labs  Lab 10/10/20 1016 10/11/20 0427 10/12/20 0447  WBC 3.8* 3.3* 2.7*  NEUTROABS  --   --  1.8  HGB 9.0* 8.5* 8.5*  HCT 26.6* 24.8* 24.4*  MCV 101.9* 97.3 100.4*  PLT 161 157 161     Cardiac Enzymes: No results for input(s): CKTOTAL, CKMB, CKMBINDEX, TROPONINI in the last 168 hours.  BNP: Invalid input(s): POCBNP  CBG: Recent Labs  Lab 10/11/20 1147 10/11/20 1809 10/11/20 2124 10/12/20 0751 10/12/20 1200  GLUCAP 113* 122* 167* 187* 195*     Microbiology: Results for orders placed or performed during the hospital encounter of 10/10/20  Resp Panel by RT-PCR (Flu A&B, Covid) Nasopharyngeal Swab     Status: None   Collection Time: 10/10/20  2:45 PM   Specimen: Nasopharyngeal Swab; Nasopharyngeal(NP) swabs in vial transport medium  Result Value Ref Range Status   SARS Coronavirus 2 by RT PCR NEGATIVE NEGATIVE Final    Comment: (NOTE) SARS-CoV-2 target nucleic acids are NOT DETECTED.  The SARS-CoV-2 RNA is generally detectable in upper respiratory specimens during the acute  phase of infection. The lowest concentration of SARS-CoV-2 viral copies this assay can detect is 138 copies/mL. A negative result does not preclude SARS-Cov-2 infection and should not be used as the sole basis for treatment or other patient management decisions. A negative result may occur with  improper specimen collection/handling, submission of specimen other than nasopharyngeal swab, presence of viral mutation(s) within the areas targeted by this assay, and inadequate number of viral copies(<138 copies/mL). A negative result must be combined with clinical observations, patient history, and epidemiological information. The expected result is Negative.  Fact Sheet for Patients:   EntrepreneurPulse.com.au  Fact Sheet for Healthcare Providers:  IncredibleEmployment.be  This test is no t yet approved or cleared by the [place] FDA and  has been authorized for detection and/or diagnosis of SARS-CoV-2 by FDA under an Emergency Use Authorization (EUA). This EUA will remain  in effect (meaning this test can be used) for the duration of the COVID-19 declaration under Section 564(b)(1) of the Act, 21 U.S.C.section 360bbb-3(b)(1), unless the authorization is terminated  or revoked sooner.       Influenza A by PCR NEGATIVE NEGATIVE Final   Influenza B by PCR NEGATIVE NEGATIVE Final    Comment: (NOTE) The Xpert Xpress SARS-CoV-2/FLU/RSV plus assay is intended as an aid in the diagnosis of influenza from Nasopharyngeal swab specimens and should not be used as a sole basis for treatment. Nasal washings and aspirates are unacceptable for Xpert Xpress SARS-CoV-2/FLU/RSV testing.  Fact Sheet for Patients: EntrepreneurPulse.com.au  Fact Sheet for Healthcare Providers: IncredibleEmployment.be  This test is not yet approved or cleared by the [place] FDA and has been authorized for detection and/or diagnosis of SARS-CoV-2 by FDA under an Emergency Use Authorization (EUA). This EUA will remain in effect (meaning this test can be used) for the duration of the COVID-19 declaration under Section 564(b)(1) of the Act, 21 U.S.C. section 360bbb-3(b)(1), unless the authorization is terminated or revoked.  Performed at [facility], [address] [place], [place] [address]     Coagulation Studies: Recent Labs    10/10/20 1500  LABPROT 14.0  INR 1.1     Urinalysis: Recent Labs    10/12/20 0315  COLORURINE YELLOW*  LABSPEC 1.013  PHURINE 5.0  GLUCOSEU NEGATIVE  HGBUR NEGATIVE  BILIRUBINUR NEGATIVE  KETONESUR NEGATIVE  PROTEINUR NEGATIVE  NITRITE NEGATIVE   LEUKOCYTESUR NEGATIVE       Imaging: DG Chest 2 View  Result Date: 10/10/2020 CLINICAL DATA:  Shortness of breath. History of liver transplant 01 March 2020. End-stage renal disease on dialysis. Weakness. EXAM: CHEST - 2 VIEW COMPARISON:  08/19/2020 FINDINGS: Patient has RIGHT-sided dialysis catheter, tip overlying the level of the LOWER superior vena/UPPER RIGHT atrium. Heart size is normal. There has been improvement in aeration of the LOWER lobes since the previous exam. Nearly resolved bilateral pleural effusions. No edema. Partially imaged probable gastrojejunostomy. IMPRESSION: Improved aeration in the LOWER lobes. Minimal scarring or atelectasis persists at the lung bases. Almost completely resolved bilateral pleural effusions. Electronically Signed   By: Nolon Nations M.D.   On: 10/10/2020 15:32     Medications:     aspirin EC  81 mg Oral Daily   atorvastatin  40 mg Oral QHS   Chlorhexidine Gluconate Cloth  6 each Topical Daily   Chlorhexidine Gluconate Cloth  6 each Topical Q0600   clopidogrel  75 mg Oral Daily   escitalopram  20 mg Oral Daily   feeding supplement (NEPRO CARB STEADY)  1,190 mL Per J Tube Q24H   free water  30 mL Per Tube Q4H   gabapentin  100 mg Oral BID   heparin  5,000 Units Subcutaneous Q8H   insulin aspart  0-5 Units Subcutaneous QHS   insulin aspart  0-9 Units Subcutaneous TID WC   melatonin  10 mg Oral QHS   metoCLOPramide  5 mg Oral TID AC   metoprolol tartrate  25 mg Oral BID   mirtazapine  15 mg Oral QHS   multivitamin  1 tablet Oral QHS   mycophenolate  1,000 mg Oral BID   pantoprazole  40 mg Oral Daily   predniSONE  17.5 mg Oral Daily   [START ON 10/13/2020] sulfamethoxazole-trimethoprim  1 tablet Oral Once per day on Mon Wed Fri   tacrolimus  7 mg Oral BID   traZODone  150 mg Oral QHS   valGANciclovir  450 mg Oral Once per day on Mon Thu   acetaminophen, albuterol, dextromethorphan-guaiFENesin, hydrALAZINE, hydrOXYzine, nitroGLYCERIN,  ondansetron (ZOFRAN) IV, senna-docusate  Assessment/ Plan:  65 y.o. male with past medical history of liver transplant on 02/18/2020 at [facility], hypertension, coronary artery disease, hyperlipidemia, [name] with cirrhosis, acute kidney injury requiring hemodialysis, history of tracheostomy placement, history of pancreatic pseudocyst, history of chyle leak status post drain placement, history of bacteremia with Staphylococcus hemolyticus who presents now with worsening uremic symptoms.  1.  End stage renal disease with hyperkalemia requiring dialysis.  Patient found to have hyperkalemia along with hyponatremia as well as metabolic encephalopathy.  He recently took him off of dialysis as he had a creatinine clearance of greater than 15.  However it appears that these estimations may actually be over estimations of his true renal function.  Dialysis reinitiated yesterday. Received 2 hour treatment with no UF. Will receive 2.5 hour treatment today with no UF. Will plan for treatment again tomorrow. After further consideration, patient is considered ESRD as of today based on labs and presentation. Patient could benefit Urea clearance for definitive diagnosis, but this must be properly scheduled around dialysis treatments for reliable results   2.  Anemia of chronic kidney disease.  Hemoglobin 8.5.  Will start patient on low dose EPO with dialysis treatments  3.  Status post liver transplant 02/18/2020.  Patient will be maintained on tacrolimus, prednisone, and mycophenolate at their current doses.  Will re-evaluate this plan daily and adjust as patient progresses.   LOS: 2 Alex Hampton 7/5/20221:56 PM

## 2020-10-12 NOTE — [place] Notes (Signed)
Attempted to initiate nocturnal feed per order. J-tube clogged/not flushing. Tried to flush with coke, ginger ale and hot water without success. MD made aware/if not resolved ok per MD to pause feeds for tonight with possibility of de clogging tomorrow 10/13/20. Will report off to night shift RN.

## 2020-10-12 NOTE — [place] Notes (Signed)
PT Cancellation Note  Patient Details Name: Alex Hampton MRN: [id number] DOB: Jul 17, 1955   Cancelled Treatment:    Reason Eval/Treat Not Completed: Medical issues which prohibited therapy.  PT consult received.  Chart reviewed.  Pt's potassium noted to be currently elevated to 5.9 this morning.  Per PT guidelines for elevated potassium, exertional activity contra-indicated.  Will hold PT at this time and re-attempt PT evaluation at a later date/time as medically appropriate.  Leitha Bleak, PT 10/12/20, 9:35 AM

## 2020-10-12 NOTE — [place] Notes (Signed)
   10/11/20 1700 10/11/20 1715 10/11/20 1730  Vitals  Temp  --   --   --   Temp Source  --   --   --   BP 130/65 115/70 122/79  MAP (mmHg) 85 85 82  BP Location  --   --   --   BP Method  --   --   --   Patient Position (if appropriate)  --   --   --   Pulse Rate 90 61 64  Pulse Rate Source  --   --   --   ECG Heart Rate (!) 59 60 61  Resp 20 20 20   During Hemodialysis Assessment  Blood Flow Rate (mL/min) 200 mL/min 200 mL/min 200 mL/min  Arterial Pressure (mmHg) -100 mmHg -110 mmHg -110 mmHg  Venous Pressure (mmHg) 30 mmHg 30 mmHg 30 mmHg  Transmembrane Pressure (mmHg) 70 mmHg 70 mmHg 80 mmHg  Ultrafiltration Rate (mL/min) 250 mL/min 250 mL/min 250 mL/min  Dialysate Flow Rate (mL/min) 500 ml/min 500 ml/min 500 ml/min  Conductivity: Machine  13.9 13.9 13.9  HD Safety Checks Performed Yes Yes Yes  Dialysis Fluid Bolus  --   --   --   Bolus Amount (mL)  --   --   --   Intra-Hemodialysis Comments Progressing as prescribed (Pt alert, on phone, stable, access visible, ufr 348) Progressing as prescribed (Pt alert, stable, access visible, ufr 417) Progressing as prescribed (Pt alert, no c/o, stable, ufr 479, access visible)    10/11/20 1732  Vitals  Temp 97.7 F (36.5 C)  Temp Source Oral  BP  --   MAP (mmHg)  --   BP Location Right Arm  BP Method Automatic  Patient Position (if appropriate) Lying  Pulse Rate 61  Pulse Rate Source Monitor  ECG Heart Rate 62  Resp (!) 21  During Hemodialysis Assessment  Blood Flow Rate (mL/min)  --   Arterial Pressure (mmHg)  --   Venous Pressure (mmHg)  --   Transmembrane Pressure (mmHg)  --   Ultrafiltration Rate (mL/min)  --   Dialysate Flow Rate (mL/min)  --   Conductivity: Machine   --   HD Safety Checks Performed  --   Dialysis Fluid Bolus Normal Saline  Bolus Amount (mL) 300 mL  Intra-Hemodialysis Comments Tolerated well;Tx completed

## 2020-10-12 NOTE — [place] Notes (Signed)
PROGRESS NOTE    Alex Hampton  VZD:[id number] DOB: 26-Oct-1955 DOA: 10/10/2020 PCP: Juluis Pitch, MD   Chief Complaint.  General weakness. Brief Narrative:   Alex Hampton is a 65 y.o. male with medical history significant of hypertension, hyperlipidemia, GERD, depression with anxiety, CAD, stent placement, anemia, liver cirrhosis with ascites (s/p of liver transplantation in [facility], on immunosuppressant), ESRD, OSA not on CPAP, s/p of G -tube, who presents with generalized weakness.  Alex Hampton also complains some loose stools, happens once a day, small to moderate amount, not watery. Upon arriving the emergency room, Alex Hampton was found to have significant hyperkalemia.  Potassium went up to 6.4.  Creatinine 2.83.  Nephrology consult is obtained.  Assessment & Plan:   Principal Problem:   Uremia Active Problems:   Hallucinations   HTN (hypertension)   Anxiety and depression   ESRD (end stage renal disease) (HCC)   Liver transplant recipient ([place])   Anemia in ESRD (end-stage renal disease) (HCC)   Acute metabolic encephalopathy   Hyperkalemia   Elevated troponin   GERD (gastroesophageal reflux disease)   CAD (coronary artery disease)   G tube feedings (HCC)   Diarrhea   Type II diabetes mellitus with renal manifestations ([place])  #1.  End-stage renal disease. Hyperkalemia. Hyponatremia. Metabolic encephalopathy. Patient mental status improved today, Alex Hampton was dialyzed yesterday, potassium still high at 5.9.  Alex Hampton will be dialyzed again today. Most likely will discharge home tomorrow after potassium is better.  #2.  History of liver transplant.  Continue immunosuppressants per  3.  Type 2 diabetes. Continue current regimen.  4.  Elevated troponin. Secondary to renal disease.  5.  Loose stools  No bowel movement since admission.  #6.  Leukopenia and anemia. Check iron B12 level.  DVT prophylaxis: Heparin Code Status: full Family Communication:  Disposition Plan:    Status is:  Inpatient  Remains inpatient appropriate because:Inpatient level of care appropriate due to severity of illness  Dispo: The patient is from: Home              Anticipated d/c is to: Home              Patient currently is not medically stable to d/c.   Difficult to place patient No        I/O last 3 completed shifts: In: 943.8 [P.O.:740; NG/GT:203.8] Out: 700 [Urine:700] Total I/O In: 240 [P.O.:240] Out: 200 [Urine:200]     Consultants:  Nephrology  Procedures: none  Antimicrobials: None   Subjective: Patient doing well today, no additional diarrhea.  No abdominal pain or nausea vomiting. No short of breath or cough. No dysuria hematuria, Alex Hampton still making significant amount of urine. No fever or chills. No short of breath or cough.  Objective: Vitals:   10/11/20 1809 10/11/20 1940 10/12/20 0411 10/12/20 0750  BP: 129/73 132/84 134/80 122/76  Pulse: (!) 59 63 73 72  Resp: 18  16 17   Temp: 98.4 F (36.9 C) 97.8 F (36.6 C) 97.7 F (36.5 C) 98 F (36.7 C)  TempSrc: Oral Oral Oral   SpO2: 100% 100% 100% 100%  Weight:      Height:        Intake/Output Summary (Last 24 hours) at 10/12/2020 1044 Last data filed at 10/12/2020 1013 Gross per 24 hour  Intake 943.75 ml  Output 450 ml  Net 493.75 ml   Filed Weights   10/10/20 1014  Weight: 79.9 kg    Examination:  General exam: Appears calm  and comfortable  Respiratory system: Clear to auscultation. Respiratory effort normal. Cardiovascular system: S1 & S2 heard, RRR. No JVD, murmurs, rubs, gallops or clicks. No pedal edema. Gastrointestinal system: Abdomen is nondistended, soft and nontender. No organomegaly or masses felt. Normal bowel sounds heard. Central nervous system: Alert and oriented. No focal neurological deficits. Extremities: Symmetric 5 x 5 power. Skin: No rashes, lesions or ulcers Psychiatry: Judgement and insight appear normal. Mood & affect appropriate.     Data Reviewed: I have  personally reviewed following labs and imaging studies  CBC: Recent Labs  Lab 10/10/20 1016 10/11/20 0427 10/12/20 0447  WBC 3.8* 3.3* 2.7*  NEUTROABS  --   --  1.8  HGB 9.0* 8.5* 8.5*  HCT 26.6* 24.8* 24.4*  MCV 101.9* 97.3 100.4*  PLT 161 157 425   Basic Metabolic Panel: Recent Labs  Lab 10/10/20 1016 10/11/20 0427 10/11/20 1447 10/12/20 0447  NA 131* 132*  --  131*  K 5.7* 6.4*  --  5.9*  CL 98 100  --  97*  CO2 23 22  --  26  GLUCOSE 195* 154*  --  222*  BUN 102* 99*  --  75*  CREATININE 2.96* 2.83*  --  2.55*  CALCIUM 10.1 10.3  --  9.1  MG  --   --   --  1.8  PHOS  --   --  5.4* 4.1   GFR: Estimated Creatinine Clearance: 32.2 mL/min (A) (by C-G formula based on SCr of 2.55 mg/dL (H)). Liver Function Tests: Recent Labs  Lab 10/10/20 1016  AST 18  ALT 13  ALKPHOS 92  BILITOT 0.5  PROT 7.4  ALBUMIN 4.0   No results for input(s): LIPASE, AMYLASE in the last 168 hours. Recent Labs  Lab 10/10/20 1500  AMMONIA 9   Coagulation Profile: Recent Labs  Lab 10/10/20 1500  INR 1.1   Cardiac Enzymes: No results for input(s): CKTOTAL, CKMB, CKMBINDEX, TROPONINI in the last 168 hours. BNP (last 3 results) No results for input(s): PROBNP in the last 8760 hours. HbA1C: Recent Labs    10/11/20 0427  HGBA1C 6.4*   CBG: Recent Labs  Lab 10/11/20 0833 10/11/20 1147 10/11/20 1809 10/11/20 2124 10/12/20 0751  GLUCAP 141* 113* 122* 167* 187*   Lipid Profile: Recent Labs    10/11/20 0427  CHOL 119  HDL 52  LDLCALC 45  TRIG 109  CHOLHDL 2.3   Thyroid Function Tests: No results for input(s): TSH, T4TOTAL, FREET4, T3FREE, THYROIDAB in the last 72 hours. Anemia Panel: No results for input(s): VITAMINB12, FOLATE, FERRITIN, TIBC, IRON, RETICCTPCT in the last 72 hours. Sepsis Labs: No results for input(s): PROCALCITON, LATICACIDVEN in the last 168 hours.  Recent Results (from the past 240 hour(s))  Resp Panel by RT-PCR (Flu A&B, Covid)  Nasopharyngeal Swab     Status: None   Collection Time: 10/10/20  2:45 PM   Specimen: Nasopharyngeal Swab; Nasopharyngeal(NP) swabs in vial transport medium  Result Value Ref Range Status   SARS Coronavirus 2 by RT PCR NEGATIVE NEGATIVE Final    Comment: (NOTE) SARS-CoV-2 target nucleic acids are NOT DETECTED.  The SARS-CoV-2 RNA is generally detectable in upper respiratory specimens during the acute phase of infection. The lowest concentration of SARS-CoV-2 viral copies this assay can detect is 138 copies/mL. A negative result does not preclude SARS-Cov-2 infection and should not be used as the sole basis for treatment or other patient management decisions. A negative result may occur with  improper  specimen collection/handling, submission of specimen other than nasopharyngeal swab, presence of viral mutation(s) within the areas targeted by this assay, and inadequate number of viral copies(<138 copies/mL). A negative result must be combined with clinical observations, patient history, and epidemiological information. The expected result is Negative.  Fact Sheet for Patients:  EntrepreneurPulse.com.au  Fact Sheet for Healthcare Providers:  IncredibleEmployment.be  This test is no t yet approved or cleared by the [place] FDA and  has been authorized for detection and/or diagnosis of SARS-CoV-2 by FDA under an Emergency Use Authorization (EUA). This EUA will remain  in effect (meaning this test can be used) for the duration of the COVID-19 declaration under Section 564(b)(1) of the Act, 21 U.S.C.section 360bbb-3(b)(1), unless the authorization is terminated  or revoked sooner.       Influenza A by PCR NEGATIVE NEGATIVE Final   Influenza B by PCR NEGATIVE NEGATIVE Final    Comment: (NOTE) The Xpert Xpress SARS-CoV-2/FLU/RSV plus assay is intended as an aid in the diagnosis of influenza from Nasopharyngeal swab specimens and should not be  used as a sole basis for treatment. Nasal washings and aspirates are unacceptable for Xpert Xpress SARS-CoV-2/FLU/RSV testing.  Fact Sheet for Patients: EntrepreneurPulse.com.au  Fact Sheet for Healthcare Providers: IncredibleEmployment.be  This test is not yet approved or cleared by the [place] FDA and has been authorized for detection and/or diagnosis of SARS-CoV-2 by FDA under an Emergency Use Authorization (EUA). This EUA will remain in effect (meaning this test can be used) for the duration of the COVID-19 declaration under Section 564(b)(1) of the Act, 21 U.S.C. section 360bbb-3(b)(1), unless the authorization is terminated or revoked.  Performed at [facility], [address], [place], [place] [address]          Radiology Studies: DG Chest 2 View  Result Date: 10/10/2020 CLINICAL DATA:  Shortness of breath. History of liver transplant 01 March 2020. End-stage renal disease on dialysis. Weakness. EXAM: CHEST - 2 VIEW COMPARISON:  08/19/2020 FINDINGS: Patient has RIGHT-sided dialysis catheter, tip overlying the level of the LOWER superior vena/UPPER RIGHT atrium. Heart size is normal. There has been improvement in aeration of the LOWER lobes since the previous exam. Nearly resolved bilateral pleural effusions. No edema. Partially imaged probable gastrojejunostomy. IMPRESSION: Improved aeration in the LOWER lobes. Minimal scarring or atelectasis persists at the lung bases. Almost completely resolved bilateral pleural effusions. Electronically Signed   By: Nolon Nations M.D.   On: 10/10/2020 15:32        Scheduled Meds:  aspirin EC  81 mg Oral Daily   atorvastatin  40 mg Oral QHS   Chlorhexidine Gluconate Cloth  6 each Topical Daily   Chlorhexidine Gluconate Cloth  6 each Topical Q0600   clopidogrel  75 mg Oral Daily   escitalopram  20 mg Oral Daily   gabapentin  100 mg Oral BID   heparin  5,000 Units Subcutaneous  Q8H   insulin aspart  0-5 Units Subcutaneous QHS   insulin aspart  0-9 Units Subcutaneous TID WC   melatonin  10 mg Oral QHS   metoCLOPramide  5 mg Oral TID AC   metoprolol tartrate  25 mg Oral BID   mirtazapine  15 mg Oral QHS   mycophenolate  1,000 mg Oral BID   pantoprazole  40 mg Oral Daily   predniSONE  17.5 mg Oral Daily   [START ON 10/13/2020] sulfamethoxazole-trimethoprim  1 tablet Oral Once per day on Mon Wed Fri   tacrolimus  7 mg Oral BID   traZODone  150 mg Oral QHS   valGANciclovir  450 mg Oral Once per day on Mon Thu   Continuous Infusions:  feeding supplement (NEPRO CARB STEADY) Stopped (10/12/20 2763)     LOS: 2 days    Time spent: 26 minutes    Sharen Hones, MD [facility]   To contact the attending provider between 7A-7P or the covering provider during after hours 7P-7A, please log into the web site [email or web address] and access using universal [place] password for that web site. If you do not have the password, please call the hospital operator.  10/12/2020, 10:44 AM

## 2020-10-13 LAB — CBC WITH DIFFERENTIAL/PLATELET
Abs Immature Granulocytes: 0.24 10*3/uL — ABNORMAL HIGH (ref 0.00–0.07)
Basophils Absolute: 0 10*3/uL (ref 0.0–0.1)
Basophils Relative: 1 %
Eosinophils Absolute: 0 10*3/uL (ref 0.0–0.5)
Eosinophils Relative: 1 %
HCT: 21.9 % — ABNORMAL LOW (ref 39.0–52.0)
Hemoglobin: 7.7 g/dL — ABNORMAL LOW (ref 13.0–17.0)
Immature Granulocytes: 9 %
Lymphocytes Relative: 24 %
Lymphs Abs: 0.6 10*3/uL — ABNORMAL LOW (ref 0.7–4.0)
MCH: 34.2 pg — ABNORMAL HIGH (ref 26.0–34.0)
MCHC: 35.2 g/dL (ref 30.0–36.0)
MCV: 97.3 fL (ref 80.0–100.0)
Monocytes Absolute: 0.3 10*3/uL (ref 0.1–1.0)
Monocytes Relative: 12 %
Neutro Abs: 1.4 10*3/uL — ABNORMAL LOW (ref 1.7–7.7)
Neutrophils Relative %: 53 %
Platelets: 142 10*3/uL — ABNORMAL LOW (ref 150–400)
RBC: 2.25 MIL/uL — ABNORMAL LOW (ref 4.22–5.81)
RDW: 13.8 % (ref 11.5–15.5)
Smear Review: NORMAL
WBC: 2.7 10*3/uL — ABNORMAL LOW (ref 4.0–10.5)
nRBC: 0 % (ref 0.0–0.2)

## 2020-10-13 LAB — BASIC METABOLIC PANEL
Anion gap: 7 (ref 5–15)
BUN: 52 mg/dL — ABNORMAL HIGH (ref 8–23)
CO2: 30 mmol/L (ref 22–32)
Calcium: 8.9 mg/dL (ref 8.9–10.3)
Chloride: 95 mmol/L — ABNORMAL LOW (ref 98–111)
Creatinine, Ser: 2.64 mg/dL — ABNORMAL HIGH (ref 0.61–1.24)
GFR, Estimated: 26 mL/min — ABNORMAL LOW (ref >60)
Glucose, Bld: 123 mg/dL — ABNORMAL HIGH (ref 70–99)
Potassium: 4.3 mmol/L (ref 3.5–5.1)
Sodium: 132 mmol/L — ABNORMAL LOW (ref 135–145)

## 2020-10-13 LAB — GLUCOSE, CAPILLARY
Glucose-Capillary: 132 mg/dL — ABNORMAL HIGH (ref 70–99)
Glucose-Capillary: 112 mg/dL — ABNORMAL HIGH (ref 70–99)
Glucose-Capillary: 145 mg/dL — ABNORMAL HIGH (ref 70–99)
Glucose-Capillary: 167 mg/dL — ABNORMAL HIGH (ref 70–99)

## 2020-10-13 LAB — PARATHYROID HORMONE, INTACT (NO CA): PTH: 17 pg/mL (ref 15–65)

## 2020-10-13 NOTE — [place] Notes (Signed)
Aware of hemodialysis placement. Spoke with patient yesterday, wants to go to [facility]. Education provided, referral sent. Please contact me with any dialysis placement concerns.  Elvera Bicker Dialysis Coordinator [phone]

## 2020-10-13 NOTE — Care Management Important Message (Signed)
Important Message  Patient Details  Name: Alex Hampton MRN: [id number] Date of Birth: 1955/06/07   Medicare Important Message Given:  N/A - LOS <3 / Initial given by admissions  Initial Medicare IM reviewed with patient via phone by Thornton Dales, Patient Access Associate on 10/12/2020 at 10:10am.     Dannette Barbara 10/13/2020, 8:38 AM

## 2020-10-13 NOTE — [place] Notes (Signed)
PEG tube ( j-tube) noted to be clotted / unsuccessful multiple attempts to un clot/ MD made aware/ per MD she will have consult ordered to assess for PEG exchange/ pt currently tolerating POs / will keep NPO after midnight for pending consult / will monitor.

## 2020-10-13 NOTE — Progress Notes (Signed)
[facility]  ROUNDING NOTE   Subjective:  Patient well-known to [place] as we follow him for acute kidney injury. He was on hemodialysis however recently we took him off as his creatinine clearance was above 15. Patient had recent liver transplantation. BUN currently 99 with a creatinine of 2.83. However potassium also high at 6.4. Suspect that the EGFR of 24 is an overestimation in his case.  Patient seen during dialysis   HEMODIALYSIS FLOWSHEET:  Blood Flow Rate (mL/min): 300 mL/min Arterial Pressure (mmHg): -100 mmHg Venous Pressure (mmHg): 70 mmHg Transmembrane Pressure (mmHg): 60 mmHg Ultrafiltration Rate (mL/min): 170 mL/min Dialysate Flow Rate (mL/min): 500 ml/min Conductivity: Machine : 13.7 Conductivity: Machine : 13.7 Dialysis Fluid Bolus: Normal Saline Bolus Amount (mL): 200 mL Dialysate Change: 2K  Complains of fatigue Denies GI concerns or shortness of breath  Objective:  Vital signs in last 24 hours:  Temp:  [97.6 F (36.4 C)-99 F (37.2 C)] 97.8 F (36.6 C) (07/06 0731) Pulse Rate:  [59-72] 61 (07/06 1015) Resp:  [14-23] 16 (07/06 1015) BP: (86-136)/(47-83) 86/59 (07/06 1000) SpO2:  [97 %-100 %] 97 % (07/06 0731) Weight:  [68.8 kg] 68.8 kg (07/06 0534)  Weight change:  Filed Weights   10/10/20 1014 10/13/20 0534  Weight: 79.9 kg 68.8 kg    Intake/Output: I/O last 3 completed shifts: In: 443.8 [P.O.:240; NG/GT:203.8] Out: 400 [Urine:400]   Intake/Output this shift:  No intake/output data recorded.  Physical Exam: General: No acute distress  Head: Normocephalic, atraumatic. Moist oral mucosal membranes  Eyes: Anicteric  Lungs:  Clear to auscultation, normal effort  Heart: S1S2 no rubs  Abdomen:  Soft, nontender, bowel sounds present  Extremities: Trace peripheral edema.  Neurologic: Awake, alert, following commands  Skin: No acute rash  Access: Right IJ PermCath    Basic Metabolic Panel: Recent Labs  Lab 10/10/20 1016  10/11/20 0427 10/11/20 1447 10/12/20 0447 10/13/20 0424  NA 131* 132*  --  131* 132*  K 5.7* 6.4*  --  5.9* 4.3  CL 98 100  --  97* 95*  CO2 23 22  --  26 30  GLUCOSE 195* 154*  --  222* 123*  BUN 102* 99*  --  75* 52*  CREATININE 2.96* 2.83*  --  2.55* 2.64*  CALCIUM 10.1 10.3  --  9.1 8.9  MG  --   --   --  1.8  --   PHOS  --   --  5.4* 4.1  --      Liver Function Tests: Recent Labs  Lab 10/10/20 1016  AST 18  ALT 13  ALKPHOS 92  BILITOT 0.5  PROT 7.4  ALBUMIN 4.0    No results for input(s): LIPASE, AMYLASE in the last 168 hours. Recent Labs  Lab 10/10/20 1500  AMMONIA 9     CBC: Recent Labs  Lab 10/10/20 1016 10/11/20 0427 10/12/20 0447 10/13/20 0424  WBC 3.8* 3.3* 2.7* 2.7*  NEUTROABS  --   --  1.8 1.4*  HGB 9.0* 8.5* 8.5* 7.7*  HCT 26.6* 24.8* 24.4* 21.9*  MCV 101.9* 97.3 100.4* 97.3  PLT 161 157 161 142*     Cardiac Enzymes: No results for input(s): CKTOTAL, CKMB, CKMBINDEX, TROPONINI in the last 168 hours.  BNP: Invalid input(s): POCBNP  CBG: Recent Labs  Lab 10/12/20 0751 10/12/20 1200 10/12/20 1753 10/12/20 2203 10/13/20 0730  GLUCAP 187* 195* 163* 153* 132*     Microbiology: Results for orders placed or performed during the  hospital encounter of 10/10/20  Resp Panel by RT-PCR (Flu A&B, Covid) Nasopharyngeal Swab     Status: None   Collection Time: 10/10/20  2:45 PM   Specimen: Nasopharyngeal Swab; Nasopharyngeal(NP) swabs in vial transport medium  Result Value Ref Range Status   SARS Coronavirus 2 by RT PCR NEGATIVE NEGATIVE Final    Comment: (NOTE) SARS-CoV-2 target nucleic acids are NOT DETECTED.  The SARS-CoV-2 RNA is generally detectable in upper respiratory specimens during the acute phase of infection. The lowest concentration of SARS-CoV-2 viral copies this assay can detect is 138 copies/mL. A negative result does not preclude SARS-Cov-2 infection and should not be used as the sole basis for treatment or other  patient management decisions. A negative result may occur with  improper specimen collection/handling, submission of specimen other than nasopharyngeal swab, presence of viral mutation(s) within the areas targeted by this assay, and inadequate number of viral copies(<138 copies/mL). A negative result must be combined with clinical observations, patient history, and epidemiological information. The expected result is Negative.  Fact Sheet for Patients:  EntrepreneurPulse.com.au  Fact Sheet for Healthcare Providers:  IncredibleEmployment.be  This test is no t yet approved or cleared by the [place] FDA and  has been authorized for detection and/or diagnosis of SARS-CoV-2 by FDA under an Emergency Use Authorization (EUA). This EUA will remain  in effect (meaning this test can be used) for the duration of the COVID-19 declaration under Section 564(b)(1) of the Act, 21 U.S.C.section 360bbb-3(b)(1), unless the authorization is terminated  or revoked sooner.       Influenza A by PCR NEGATIVE NEGATIVE Final   Influenza B by PCR NEGATIVE NEGATIVE Final    Comment: (NOTE) The Xpert Xpress SARS-CoV-2/FLU/RSV plus assay is intended as an aid in the diagnosis of influenza from Nasopharyngeal swab specimens and should not be used as a sole basis for treatment. Nasal washings and aspirates are unacceptable for Xpert Xpress SARS-CoV-2/FLU/RSV testing.  Fact Sheet for Patients: EntrepreneurPulse.com.au  Fact Sheet for Healthcare Providers: IncredibleEmployment.be  This test is not yet approved or cleared by the [place] FDA and has been authorized for detection and/or diagnosis of SARS-CoV-2 by FDA under an Emergency Use Authorization (EUA). This EUA will remain in effect (meaning this test can be used) for the duration of the COVID-19 declaration under Section 564(b)(1) of the Act, 21 U.S.C. section  360bbb-3(b)(1), unless the authorization is terminated or revoked.  Performed at [facility], [address] [place], [place] [address]     Coagulation Studies: Recent Labs    10/10/20 1500  LABPROT 14.0  INR 1.1     Urinalysis: Recent Labs    10/12/20 0315  COLORURINE YELLOW*  LABSPEC 1.013  PHURINE 5.0  GLUCOSEU NEGATIVE  HGBUR NEGATIVE  BILIRUBINUR NEGATIVE  KETONESUR NEGATIVE  PROTEINUR NEGATIVE  NITRITE NEGATIVE  LEUKOCYTESUR NEGATIVE       Imaging: No results found.   Medications:     aspirin EC  81 mg Oral Daily   atorvastatin  40 mg Oral QHS   Chlorhexidine Gluconate Cloth  6 each Topical Daily   Chlorhexidine Gluconate Cloth  6 each Topical Q0600   clopidogrel  75 mg Oral Daily   escitalopram  20 mg Oral Daily   feeding supplement (NEPRO CARB STEADY)  1,190 mL Per J Tube Q24H   free water  30 mL Per Tube Q4H   gabapentin  100 mg Oral BID   heparin  5,000 Units Subcutaneous Q8H  insulin aspart  0-5 Units Subcutaneous QHS   insulin aspart  0-9 Units Subcutaneous TID WC   melatonin  10 mg Oral QHS   metoCLOPramide  5 mg Oral TID AC   metoprolol tartrate  25 mg Oral BID   mirtazapine  15 mg Oral QHS   multivitamin  1 tablet Oral QHS   mycophenolate  1,000 mg Oral BID   pantoprazole  40 mg Oral Daily   predniSONE  17.5 mg Oral Daily   sulfamethoxazole-trimethoprim  1 tablet Oral Once per day on Mon Wed Fri   tacrolimus  7 mg Oral BID   traZODone  150 mg Oral QHS   valGANciclovir  450 mg Oral Once per day on Mon Thu   acetaminophen, albuterol, dextromethorphan-guaiFENesin, hydrALAZINE, hydrOXYzine, nitroGLYCERIN, ondansetron (ZOFRAN) IV, senna-docusate  Assessment/ Plan:  65 y.o. male with past medical history of liver transplant on 02/18/[facility] at [facility], hypertension, coronary artery disease, hyperlipidemia, [name] with cirrhosis, acute kidney injury requiring hemodialysis, history of tracheostomy placement,  history of pancreatic pseudocyst, history of chyle leak status post drain placement, history of bacteremia with Staphylococcus hemolyticus who presents now with worsening uremic symptoms.  1.  End stage renal disease with hyperkalemia requiring dialysis.  Patient found to have hyperkalemia along with hyponatremia as well as metabolic encephalopathy.  He recently took him off of dialysis as he had a creatinine clearance of greater than 15.  However it appears that these estimations may actually be over estimations of his true renal function.  After further consideration, patient is considered ESRD as of 10/12/20 based on labs and presentation.Dialysis reinitiated 10/11/2020. Received second treatment yesterday, tolerated well. Currently receiving third treatment. UF goal 0.5L but turned off due to hypotension. Patient asymptomatic  Patient could benefit Urea clearance for definitive diagnosis, but this must be properly scheduled around dialysis treatments for reliable results. May consider this outpatient. Dialysis coordinator submitted forms to reinstate clinic status at [facility] outpatient, [place]    2.  Anemia of chronic kidney disease.  Hemoglobin 7.7.  Low dose EPO began with today's treatment.  3.  Status post liver transplant 02/18/2020.  Patient will be maintained on tacrolimus, prednisone, and mycophenolate at their current doses.  Will monitor and adjust as needed.    LOS: 3 Ashantia Amaral 7/6/202212:00 PM

## 2020-10-13 NOTE — Progress Notes (Signed)
Occupational Therapy Treatment Patient Details Name: Alex Hampton MRN: [id number] DOB: [birth year] Today's Date: 10/13/2020    History of present illness Per MD Notes: Alex Hampton is a 65 y.o. male with medical history significant of hypertension, hyperlipidemia, GERD, depression with anxiety, CAD, stent placement, anemia, liver cirrhosis with ascites (s/p of liver transplantation at [facility] in 2021, on immunosuppressant), ESRD, OSA not on CPAP, s/p of G -tube, who presents with generalized weakness and diarrhea. Upon arriving the emergency room, he was found to have significant hyperkalemia.  Potassium went up to 6.4.  Creatinine 2.83.  Nephrology following.   OT comments  Pt seen for OT tx on this date. Goal for session to f/u re: BUE HEP as discussed by pt and this author on previous date. Pt provided with [facility]na handout and instructed on accessing digital copy of HEP via smart phone if desired. Pt return verbalizes understanding. Endorses fatigue from recent dialysis session, so therapist demos all BUE exercises and reviews safety instruction for use of yellow latex-free Theraband this date. Pt return verbalizes understanding. Will continue to provide opportunity for therapeutic exercises at future OT sessions. Pt encouraged to engage in both therapeutic exercises and functional activity while acutely hospitalized. Pt making good progress toward goals and continues to benefit from skilled OT services to maximize return to PLOF and minimize risk of future falls, injury, caregiver burden, and readmission. Will continue to follow POC. Discharge recommendation remains appropriate.      Follow Up Recommendations  SNF   Equipment Recommendations  Tub/shower bench    Recommendations for Other Services      Precautions / Restrictions Precautions Precautions: Fall Restrictions Weight Bearing Restrictions: No       Mobility Bed Mobility Overal bed mobility: Modified Independent                   Transfers                      Balance Overall balance assessment: Needs assistance                                         ADL either performed or assessed with clinical judgement   ADL Overall ADL's : Needs assistance/impaired                                             Vision Baseline Vision/History: Wears glasses Wears Glasses: At all times Patient Visual Report: Diplopia;No change from baseline     Perception     Praxis      Cognition Arousal/Alertness: Awake/alert Behavior During Therapy: WFL for tasks assessed/performed Overall Cognitive Status: Within Functional Limits for tasks assessed                                          Exercises Other Exercises Other Exercises: OT facilitates education on BUE HEP and dispenses yellow theraband for pt use. Pt educated on safety and all BUE theraband exercises demonstrated. Pt return verbalizes understanding of education provided.   Shoulder Instructions       General Comments      Pertinent Vitals/ Pain  Pain Assessment: No/denies pain  Home Living                                          Prior Functioning/Environment              Frequency  Min 2X/week        Progress Toward Goals  OT Goals(current goals can now be found in the care plan section)  Progress towards OT goals: Progressing toward goals  Acute Rehab OT Goals Patient Stated Goal: To feel better and go home OT Goal Formulation: With patient Time For Goal Achievement: 10/26/20 Potential to Achieve Goals: Good  Plan Discharge plan remains appropriate;Frequency remains appropriate    Co-evaluation                 AM-PAC OT "6 Clicks" Daily Activity     Outcome Measure   Help from another person eating meals?: None Help from another person taking care of personal grooming?: A Little Help from another person toileting,  which includes using toliet, bedpan, or urinal?: A Little Help from another person bathing (including washing, rinsing, drying)?: A Lot Help from another person to put on and taking off regular upper body clothing?: A Little Help from another person to put on and taking off regular lower body clothing?: A Lot 6 Click Score: 17    End of Session    OT Visit Diagnosis: Other abnormalities of gait and mobility (R26.89);Muscle weakness (generalized) (M62.81)   Activity Tolerance Patient tolerated treatment well;No increased pain   Patient Left in bed;with call bell/phone within reach;with bed alarm set   Nurse Communication          Time: 1497-0263 OT Time Calculation (min): 12 min  Charges: OT General Charges $OT Visit: 1 Visit OT Treatments $Therapeutic Activity: 8-22 mins  Shara Blazing, M.S., OTR/L Ascom: [phone] 10/13/20, 2:45 PM

## 2020-10-13 NOTE — Progress Notes (Signed)
PROGRESS NOTE    DOLPHUS LINCH  JWJ:[id number] DOB: 1955/12/11 DOA: 10/10/2020 PCP: Juluis Pitch, MD   Chief Complaint.  General weakness. Brief Narrative:   Alex Hampton is a 65 y.o. male with medical history significant of hypertension, hyperlipidemia, GERD, depression with anxiety, CAD, stent placement, anemia, liver cirrhosis with ascites (s/p of liver transplantation in [facility], on immunosuppressant), ESRD, OSA not on CPAP, s/p of G -tube, who presents with generalized weakness.  He also complains some loose stools, happens once a day, small to moderate amount, not watery. Upon arriving the emergency room, he was found to have significant hyperkalemia.  Potassium went up to 6.4.  Creatinine 2.83.  Nephrology consult is obtained.  Assessment & Plan:   Principal Problem:   Uremia Active Problems:   Hallucinations   HTN (hypertension)   Anxiety and depression   ESRD (end stage renal disease) (HCC)   Liver transplant recipient ([place])   Anemia in ESRD (end-stage renal disease) (HCC)   Acute metabolic encephalopathy   Hyperkalemia   Elevated troponin   GERD (gastroesophageal reflux disease)   CAD (coronary artery disease)   G tube feedings (HCC)   Diarrhea   Type II diabetes mellitus with renal manifestations ([place])  #1.  End-stage renal disease. --started on dialysis --iHD per nephrology --will be set up for outpatient dialysis  Hyperkalemia, resolved --2/2 to ESRD, resolved with dialysis --dialysis per nephrology  Hyponatremia, mild  Metabolic encephalopathy due to uremia, resolved --mental status back to baseline  History of liver transplant.   --cont home cellcept, prograf and prednisone --cont Bactrim and Valcyte   3.  Type 2 diabetes. --cont SSI  4.  Elevated troponin. Secondary to renal disease.  #6.  Leukopenia and anemia. --iron and B12 level not def   DVT prophylaxis: Heparin Code Status: full Family Communication: son updated at bedside  today Disposition Plan:    Status is: Inpatient  Remains inpatient appropriate because:Inpatient level of care appropriate due to severity of illness  Dispo: The patient is from: Home              Anticipated d/c is to: Home              Patient currently is not medically stable to d/c.   Difficult to place patient No    I/O last 3 completed shifts: In: 720 [P.O.:720] Out: 252 [Urine:625] No intake/output data recorded.     Consultants:  Nephrology  Procedures: none  Antimicrobials: None   Subjective: J-tube was clogged so pt didn't receive his usual nocturnal tube feeds.  Pt did eat his meals.  Still making urine.     Objective: Vitals:   10/13/20 1215 10/13/20 1230 10/13/20 1322 10/13/20 1517  BP: 105/71 112/68 104/72 105/63  Pulse: 63 65 67 (!) 58  Resp: 13 16 18 17   Temp:   98.5 F (36.9 C) 98 F (36.7 C)  TempSrc:   Oral Oral  SpO2:   100% 100%  Weight:      Height:        Intake/Output Summary (Last 24 hours) at 10/13/2020 1946 Last data filed at 10/13/2020 1811 Gross per 24 hour  Intake 480 ml  Output -148 ml  Net 628 ml   Filed Weights   10/10/20 1014 10/13/20 0534  Weight: 79.9 kg 68.8 kg    Examination:  Constitutional: NAD, AAOx3 HEENT: conjunctivae and lids normal, EOMI CV: No cyanosis.   RESP: normal respiratory effort, on RA Extremities: No effusions,  edema in BLE SKIN: warm, dry Neuro: II - XII grossly intact.   Psych: Normal mood and affect.  Appropriate judgement and reason   Data Reviewed: I have personally reviewed following labs and imaging studies  CBC: Recent Labs  Lab 10/10/20 1016 10/11/20 0427 10/12/20 0447 10/13/20 0424  WBC 3.8* 3.3* 2.7* 2.7*  NEUTROABS  --   --  1.8 1.4*  HGB 9.0* 8.5* 8.5* 7.7*  HCT 26.6* 24.8* 24.4* 21.9*  MCV 101.9* 97.3 100.4* 97.3  PLT 161 157 161 932*   Basic Metabolic Panel: Recent Labs  Lab 10/10/20 1016 10/11/20 0427 10/11/20 1447 10/12/20 0447 10/13/20 0424  NA 131* 132*   --  131* 132*  K 5.7* 6.4*  --  5.9* 4.3  CL 98 100  --  97* 95*  CO2 23 22  --  26 30  GLUCOSE 195* 154*  --  222* 123*  BUN 102* 99*  --  75* 52*  CREATININE 2.96* 2.83*  --  2.55* 2.64*  CALCIUM 10.1 10.3  --  9.1 8.9  MG  --   --   --  1.8  --   PHOS  --   --  5.4* 4.1  --    GFR: Estimated Creatinine Clearance: 26.8 mL/min (A) (by C-G formula based on SCr of 2.64 mg/dL (H)). Liver Function Tests: Recent Labs  Lab 10/10/20 1016  AST 18  ALT 13  ALKPHOS 92  BILITOT 0.5  PROT 7.4  ALBUMIN 4.0   No results for input(s): LIPASE, AMYLASE in the last 168 hours. Recent Labs  Lab 10/10/20 1500  AMMONIA 9   Coagulation Profile: Recent Labs  Lab 10/10/20 1500  INR 1.1   Cardiac Enzymes: No results for input(s): CKTOTAL, CKMB, CKMBINDEX, TROPONINI in the last 168 hours. BNP (last 3 results) No results for input(s): PROBNP in the last 8760 hours. HbA1C: Recent Labs    10/11/20 0427  HGBA1C 6.4*   CBG: Recent Labs  Lab 10/12/20 1753 10/12/20 2203 10/13/20 0730 10/13/20 1321 10/13/20 1608  GLUCAP 163* 153* 132* 112* 145*   Lipid Profile: Recent Labs    10/11/20 0427  CHOL 119  HDL 52  LDLCALC 45  TRIG 109  CHOLHDL 2.3   Thyroid Function Tests: No results for input(s): TSH, T4TOTAL, FREET4, T3FREE, THYROIDAB in the last 72 hours. Anemia Panel: Recent Labs    10/12/20 0447 10/12/20 1220  VITAMINB12  --  959*  TIBC 332  --   IRON 151  --    Sepsis Labs: No results for input(s): PROCALCITON, LATICACIDVEN in the last 168 hours.  Recent Results (from the past 240 hour(s))  Resp Panel by RT-PCR (Flu A&B, Covid) Nasopharyngeal Swab     Status: None   Collection Time: 10/10/20  2:45 PM   Specimen: Nasopharyngeal Swab; Nasopharyngeal(NP) swabs in vial transport medium  Result Value Ref Range Status   SARS Coronavirus 2 by RT PCR NEGATIVE NEGATIVE Final    Comment: (NOTE) SARS-CoV-2 target nucleic acids are NOT DETECTED.  The SARS-CoV-2 RNA is  generally detectable in upper respiratory specimens during the acute phase of infection. The lowest concentration of SARS-CoV-2 viral copies this assay can detect is 138 copies/mL. A negative result does not preclude SARS-Cov-2 infection and should not be used as the sole basis for treatment or other patient management decisions. A negative result may occur with  improper specimen collection/handling, submission of specimen other than nasopharyngeal swab, presence of viral mutation(s) within the areas targeted  by this assay, and inadequate number of viral copies(<138 copies/mL). A negative result must be combined with clinical observations, patient history, and epidemiological information. The expected result is Negative.  Fact Sheet for Patients:  EntrepreneurPulse.com.au  Fact Sheet for Healthcare Providers:  IncredibleEmployment.be  This test is no t yet approved or cleared by the [place] FDA and  has been authorized for detection and/or diagnosis of SARS-CoV-2 by FDA under an Emergency Use Authorization (EUA). This EUA will remain  in effect (meaning this test can be used) for the duration of the COVID-19 declaration under Section 564(b)(1) of the Act, 21 U.S.C.section 360bbb-3(b)(1), unless the authorization is terminated  or revoked sooner.       Influenza A by PCR NEGATIVE NEGATIVE Final   Influenza B by PCR NEGATIVE NEGATIVE Final    Comment: (NOTE) The Xpert Xpress SARS-CoV-2/FLU/RSV plus assay is intended as an aid in the diagnosis of influenza from Nasopharyngeal swab specimens and should not be used as a sole basis for treatment. Nasal washings and aspirates are unacceptable for Xpert Xpress SARS-CoV-2/FLU/RSV testing.  Fact Sheet for Patients: EntrepreneurPulse.com.au  Fact Sheet for Healthcare Providers: IncredibleEmployment.be  This test is not yet approved or cleared by the [place] FDA and has been authorized for detection and/or diagnosis of SARS-CoV-2 by FDA under an Emergency Use Authorization (EUA). This EUA will remain in effect (meaning this test can be used) for the duration of the COVID-19 declaration under Section 564(b)(1) of the Act, 21 U.S.C. section 360bbb-3(b)(1), unless the authorization is terminated or revoked.  Performed at [facility], [address], [place], [place] [address]          Radiology Studies: No results found.      Scheduled Meds:  aspirin EC  81 mg Oral Daily   atorvastatin  40 mg Oral QHS   Chlorhexidine Gluconate Cloth  6 each Topical Daily   Chlorhexidine Gluconate Cloth  6 each Topical Q0600   escitalopram  20 mg Oral Daily   feeding supplement (NEPRO CARB STEADY)  1,190 mL Per J Tube Q24H   free water  30 mL Per Tube Q4H   gabapentin  100 mg Oral BID   heparin  5,000 Units Subcutaneous Q8H   insulin aspart  0-5 Units Subcutaneous QHS   insulin aspart  0-9 Units Subcutaneous TID WC   melatonin  10 mg Oral QHS   metoCLOPramide  5 mg Oral TID AC   metoprolol tartrate  25 mg Oral BID   mirtazapine  15 mg Oral QHS   multivitamin  1 tablet Oral QHS   mycophenolate  1,000 mg Oral BID   pantoprazole  40 mg Oral Daily   predniSONE  17.5 mg Oral Daily   sulfamethoxazole-trimethoprim  1 tablet Oral Once per day on Mon Wed Fri   tacrolimus  7 mg Oral BID   traZODone  150 mg Oral QHS   valGANciclovir  450 mg Oral Once per day on Mon Thu   Continuous Infusions:     LOS: 3 days     Enzo Bi, MD [facility]   To contact the attending provider between 7A-7P or the covering provider during after hours 7P-7A, please log into the web site [email or web address] and access using universal [place] password for that web site. If you do not have the password, please call the hospital operator.  10/13/2020, 7:46 PM

## 2020-10-13 NOTE — Progress Notes (Signed)
10/12/20 1430 10/12/20 1445 10/12/20 1500  Vitals  Temp 97.6 F (36.4 C)  --   --   Temp Source Oral  --   --   BP 136/79 136/80 133/80  MAP (mmHg) 96 97 96  BP Location Right Arm  --   --   BP Method Automatic  --   --   Patient Position (if appropriate) Lying  --   --   Pulse Rate 70 68 70  Pulse Rate Source Monitor  --   --   ECG Heart Rate 72 72 72  Resp 18 17 18   During Hemodialysis Assessment  Blood Flow Rate (mL/min) 250 mL/min 250 mL/min 250 mL/min  Arterial Pressure (mmHg) -120 mmHg -130 mmHg -120 mmHg  Venous Pressure (mmHg) 100 mmHg 100 mmHg 100 mmHg  Transmembrane Pressure (mmHg) 60 mmHg 60 mmHg 60 mmHg  Ultrafiltration Rate (mL/min) 200 mL/min 200 mL/min 200 mL/min  Dialysate Flow Rate (mL/min) 500 ml/min 500 ml/min 500 ml/min  Conductivity: Machine  13.8 13.8 13.8  HD Safety Checks Performed Yes Yes Yes  Dialysis Fluid Bolus Normal Saline  --   --   Bolus Amount (mL) 200 mL  --   --   Intra-Hemodialysis Comments Tx initiated (TX started at 1438) Tolerated well Progressing as prescribed    10/12/20 1530 10/12/20 1600 10/12/20 1615  Vitals  Temp  --   --   --   Temp Source  --   --   --   BP 120/78 (!) 110/58 115/72  MAP (mmHg) 90 88 89  BP Location  --   --   --   BP Method  --   --   --   Patient Position (if appropriate)  --   --   --   Pulse Rate 72 72 70  Pulse Rate Source  --   --   --   ECG Heart Rate 72 70 68  Resp (!) 23 18 18   During Hemodialysis Assessment  Blood Flow Rate (mL/min) 250 mL/min 250 mL/min 250 mL/min  Arterial Pressure (mmHg) -120 mmHg -120 mmHg -120 mmHg  Venous Pressure (mmHg) 80 mmHg 80 mmHg 80 mmHg  Transmembrane Pressure (mmHg) 60 mmHg 60 mmHg 60 mmHg  Ultrafiltration Rate (mL/min) 200 mL/min 200 mL/min 200 mL/min  Dialysate Flow Rate (mL/min) 500 ml/min 500 ml/min 500 ml/min  Conductivity: Machine  14 14 14   HD Safety Checks Performed Yes Yes Yes  Dialysis Fluid Bolus  --   --   --   Bolus Amount (mL)  --   --   --    Intra-Hemodialysis Comments Tolerated well Progressing as prescribed Tolerated well    10/12/20 1630 10/12/20 1645 10/12/20 1700  Vitals  Temp  --   --   --   Temp Source  --   --   --   BP 111/77 119/77 110/78  MAP (mmHg) 88 91 89  BP Location  --   --   --   BP Method  --   --   --   Patient Position (if appropriate)  --   --   --   Pulse Rate 70 72 72  Pulse Rate Source  --   --   --   ECG Heart Rate 72 70 70  Resp 17 17 17   During Hemodialysis Assessment  Blood Flow Rate (mL/min) 250 mL/min 250 mL/min  --   Arterial Pressure (mmHg) -120 mmHg -120 mmHg  --   Venous  Pressure (mmHg) 90 mmHg 90 mmHg  --   Transmembrane Pressure (mmHg) 60 mmHg 60 mmHg  --   Ultrafiltration Rate (mL/min) 200 mL/min 200 mL/min  --   Dialysate Flow Rate (mL/min) 500 ml/min 500 ml/min  --   Conductivity: Machine  14 14  --   HD Safety Checks Performed  --  Yes  --   Dialysis Fluid Bolus  --   --   --   Bolus Amount (mL)  --   --   --   Intra-Hemodialysis Comments Progressing as prescribed Tolerated well Tx completed (Ended [id number])

## 2020-10-13 NOTE — Progress Notes (Signed)
Pt is A&O, VS stable, NSR on the monitor. Jtube still clogged, did not get any nocturnal feedings, slept well, RA, no complaints of pain or discomfort, refused full body CHG, only allowed me to wash his chest and stomach

## 2020-10-13 NOTE — Evaluation (Signed)
Physical Therapy Evaluation Patient Details Name: Alex Hampton MRN: [id number] DOB: April 19, 1955 Today's Date: 10/13/2020   History of Present Illness  Pt is a 65 y.o. male presenting to hospital 7/3 with increasing weakness and diarrhea.  Pt with h/o liver transplant at [facility] November 2021.  Pt admitted with uremia and h/o ESRD (pt off dialysis for about 2 weeks), chronic hallucinations, htn, hyperkalemia, elevated troponin, and G tube feedings.  PMH includes ESRD on dialysis, htn, MI, DM, anemia, cirrhosis of liver with ascites, OSA, L humerus fx 10/17/2019.  Clinical Impression  Prior to hospital admission, pt was modified independent ambulating with 4ww; lives in 1 level home (level entry) and has support from live in aide and family.  Currently pt is modified independent with bed mobility; CGA with transfers; and CGA ambulating 100 feet with RW (pt shaky at times so CGA provided for safety--pt reporting having dialysis today and lunch tray had not arrived yet--therapist called dining services and tray delivered to pt's room end of session).  Generalized weakness noted with decreased activity tolerance compared to baseline  Pt would benefit from skilled PT to address noted impairments and functional limitations (see below for any additional details).  Upon hospital discharge, pt would benefit from 24/7 assist with functional mobility for safety (pt reports live in aide and family able to provide 24/7 assist).    Follow Up Recommendations Home health PT;Supervision/Assistance - 24 hour    Equipment Recommendations  Rolling walker with 5" wheels;3in1 (PT)    Recommendations for Other Services       Precautions / Restrictions Precautions Precautions: Fall Restrictions Weight Bearing Restrictions: No      Mobility  Bed Mobility Overal bed mobility: Modified Independent Bed Mobility: Supine to Sit;Sit to Supine Rolling: Modified independent (Device/Increase time)   Supine to sit: Modified  independent (Device/Increase time)     General bed mobility comments: Mild increased effort to perform on own    Transfers Overall transfer level: Needs assistance Equipment used: Rolling walker (2 wheeled) Transfers: Sit to/from Stand Sit to Stand: Min guard         General transfer comment: mild increased effort to stand up to RW  Ambulation/Gait Ambulation/Gait assistance: Min guard Gait Distance (Feet): 100 Feet Assistive device: Rolling walker (2 wheeled)   Gait velocity: decreased   General Gait Details: partial step through gait pattern; shaky at times (CGA provided for safety)  Stairs            Wheelchair Mobility    Modified Rankin (Stroke Patients Only)       Balance Overall balance assessment: Needs assistance Sitting-balance support: No upper extremity supported;Feet supported Sitting balance-Leahy Scale: Normal Sitting balance - Comments: steady sitting reaching outside BOS   Standing balance support: Single extremity supported Standing balance-Leahy Scale: Fair Standing balance comment: steady standing with at least single UE support                             Pertinent Vitals/Pain Pain Assessment: No/denies pain Pain Intervention(s): Limited activity within patient's tolerance;Monitored during session;Repositioned Vitals (HR and O2 on room air) stable and WFL throughout treatment session.    Home Living Family/patient expects to be discharged to:: Private residence Living Arrangements: Other (Comment) (Live in aide) Available Help at Discharge: Family;Available 24 hours/day;Home health   Home Access: Level entry     Home Layout: One level Home Equipment: [place] - 4 wheels;Bedside commode;Grab bars - tub/shower  Prior Function Level of Independence: Needs assistance   Gait / Transfers Assistance Needed: Ambulates with 4ww in home.  H/o falls.  ADL's / Homemaking Assistance Needed: Per OT evaluation "Pt reports he  is mod I (using 4WW/BSC) for ADL management. Pt states he dresses himself independently, sponge bathes, and cooks light meals. PCA's assist with cleaning, laundry, etc."        Hand Dominance   Dominant Hand: Right    Extremity/Trunk Assessment   Upper Extremity Assessment Upper Extremity Assessment: Defer to OT evaluation    Lower Extremity Assessment Lower Extremity Assessment: Generalized weakness    Cervical / Trunk Assessment Cervical / Trunk Assessment: Normal  Communication   Communication: No difficulties  Cognition Arousal/Alertness: Awake/alert Behavior During Therapy: WFL for tasks assessed/performed Overall Cognitive Status: Within Functional Limits for tasks assessed                                        General Comments  Nursing cleared pt for participation in physical therapy.  Pt agreeable to PT session.    Exercises    Assessment/Plan    PT Assessment Patient needs continued PT services  PT Problem List Decreased strength;Decreased activity tolerance;Decreased balance;Decreased mobility       PT Treatment Interventions DME instruction;Gait training;Functional mobility training;Therapeutic activities;Therapeutic exercise;Balance training;Patient/family education    PT Goals (Current goals can be found in the Care Plan section)  Acute Rehab PT Goals Patient Stated Goal: To feel better and go home PT Goal Formulation: With patient Time For Goal Achievement: 10/27/20 Potential to Achieve Goals: Good    Frequency Min 2X/week   Barriers to discharge        Co-evaluation               AM-PAC PT "6 Clicks" Mobility  Outcome Measure Help needed turning from your back to your side while in a flat bed without using bedrails?: None Help needed moving from lying on your back to sitting on the side of a flat bed without using bedrails?: None Help needed moving to and from a bed to a chair (including a wheelchair)?: A  Little Help needed standing up from a chair using your arms (e.g., wheelchair or bedside chair)?: A Little Help needed to walk in hospital room?: A Little Help needed climbing 3-5 steps with a railing? : A Little 6 Click Score: 20    End of Session Equipment Utilized During Treatment: Gait belt Activity Tolerance: Patient limited by fatigue Patient left: in bed;with call bell/phone within reach;with bed alarm set Nurse Communication: Mobility status;Precautions PT Visit Diagnosis: Other abnormalities of gait and mobility (R26.89);Muscle weakness (generalized) (M62.81);History of falling (Z91.81)    Time: 7062-3762 PT Time Calculation (min) (ACUTE ONLY): 22 min   Charges:   PT Evaluation $PT Eval Low Complexity: 1 Low PT Treatments $Therapeutic Activity: 8-22 mins       Leitha Bleak, PT 10/13/20, 5:35 PM

## 2020-10-13 NOTE — Progress Notes (Signed)
PT Cancellation Note  Patient Details Name: LENNOX DOLBERRY MRN: [id number] DOB: 01/07/1956   Cancelled Treatment:    Reason Eval/Treat Not Completed: Patient at procedure or test/unavailable.  Pt currently off unit at dialysis.  Will re-attempt PT evaluation at a later date/time.  Leitha Bleak, PT 10/13/20, 11:56 AM

## 2020-10-14 ENCOUNTER — Inpatient Hospital Stay: Payer: Medicare Other

## 2020-10-14 HISTORY — PX: IR REPLC GASTRO/COLONIC TUBE PERCUT W/FLUORO: IMG2333

## 2020-10-14 LAB — CBC
HCT: 20.2 % — ABNORMAL LOW (ref 39.0–52.0)
Hemoglobin: 7.1 g/dL — ABNORMAL LOW (ref 13.0–17.0)
MCH: 34.8 pg — ABNORMAL HIGH (ref 26.0–34.0)
MCHC: 35.1 g/dL (ref 30.0–36.0)
MCV: 99 fL (ref 80.0–100.0)
Platelets: 117 10*3/uL — ABNORMAL LOW (ref 150–400)
RBC: 2.04 MIL/uL — ABNORMAL LOW (ref 4.22–5.81)
RDW: 13.6 % (ref 11.5–15.5)
WBC: 2.1 10*3/uL — ABNORMAL LOW (ref 4.0–10.5)
nRBC: 0 % (ref 0.0–0.2)

## 2020-10-14 LAB — MAGNESIUM: Magnesium: 1.6 mg/dL — ABNORMAL LOW (ref 1.7–2.4)

## 2020-10-14 LAB — BASIC METABOLIC PANEL
Anion gap: 10 (ref 5–15)
BUN: 38 mg/dL — ABNORMAL HIGH (ref 8–23)
CO2: 29 mmol/L (ref 22–32)
Calcium: 8.7 mg/dL — ABNORMAL LOW (ref 8.9–10.3)
Chloride: 95 mmol/L — ABNORMAL LOW (ref 98–111)
Creatinine, Ser: 2.66 mg/dL — ABNORMAL HIGH (ref 0.61–1.24)
GFR, Estimated: 26 mL/min — ABNORMAL LOW (ref >60)
Glucose, Bld: 117 mg/dL — ABNORMAL HIGH (ref 70–99)
Potassium: 4 mmol/L (ref 3.5–5.1)
Sodium: 134 mmol/L — ABNORMAL LOW (ref 135–145)

## 2020-10-14 LAB — GLUCOSE, CAPILLARY
Glucose-Capillary: 106 mg/dL — ABNORMAL HIGH (ref 70–99)
Glucose-Capillary: 107 mg/dL — ABNORMAL HIGH (ref 70–99)

## 2020-10-14 MED ORDER — MAGNESIUM SULFATE 2 GM/50ML IV SOLN
2.0000 g | Freq: Once | INTRAVENOUS | Status: AC
  Administered 2020-10-14: 2 g via INTRAVENOUS
  Filled 2020-10-14: qty 50

## 2020-10-14 MED ORDER — BISACODYL 10 MG RE SUPP: 10.0000 mg | Freq: Once | RECTAL | Status: DC

## 2020-10-14 MED ORDER — SENNOSIDES-DOCUSATE SODIUM 8.6-50 MG PO TABS: 2.0000 | ORAL_TABLET | Freq: Two times a day (BID) | ORAL | Status: AC | PRN

## 2020-10-14 MED ORDER — IODIXANOL 320 MG/ML IV SOLN
50.0000 mL | Freq: Once | INTRAVENOUS | Status: AC
  Administered 2020-10-14: 10 mL

## 2020-10-14 MED ORDER — PANTOPRAZOLE SODIUM 40 MG PO TBEC: 40.0000 mg | DELAYED_RELEASE_TABLET | Freq: Every day | ORAL | Status: DC

## 2020-10-14 NOTE — Progress Notes (Signed)
[facility]  ROUNDING NOTE   Subjective:  Patient well-known to [place] as we follow him for acute kidney injury. He was on hemodialysis however recently we took him off as his creatinine clearance was above 15. Patient had recent liver transplantation. BUN currently 99 with a creatinine of 2.83. However potassium also high at 6.4. Suspect that the EGFR of 24 is an overestimation in his case.  Patient seen and evaluated at bedside Dialysis yesterday, tolerated well Tolerating meals Denies pain and discomfort   Objective:  Vital signs in last 24 hours:  Temp:  [97.7 F (36.5 C)-98.5 F (36.9 C)] 97.8 F (36.6 C) (07/07 0735) Pulse Rate:  [58-67] 58 (07/07 0735) Resp:  [10-19] 19 (07/07 0300) BP: (86-131)/(59-75) 112/71 (07/07 0735) SpO2:  [95 %-100 %] 98 % (07/07 0735)  Weight change:  Filed Weights   10/10/20 1014 10/13/20 0534  Weight: 79.9 kg 68.8 kg    Intake/Output: I/O last 3 completed shifts: In: 680 [P.O.:680] Out: -148 [Urine:225]   Intake/Output this shift:  No intake/output data recorded.  Physical Exam: General: No acute distress  Head: Normocephalic, atraumatic. Moist oral mucosal membranes  Eyes: Anicteric  Lungs:  Clear to auscultation, normal effort  Heart: S1S2 no rubs  Abdomen:  Soft, nontender, bowel sounds present, G/J tube  Extremities: Trace peripheral edema.  Neurologic: Awake, alert, following commands  Skin: No acute rash  Access: Right IJ PermCath    Basic Metabolic Panel: Recent Labs  Lab 10/10/20 1016 10/11/20 0427 10/11/20 1447 10/12/20 0447 10/13/20 0424 10/14/20 0444  NA 131* 132*  --  131* 132* 134*  K 5.7* 6.4*  --  5.9* 4.3 4.0  CL 98 100  --  97* 95* 95*  CO2 23 22  --  '26 30 29  ' GLUCOSE 195* 154*  --  222* 123* 117*  BUN 102* 99*  --  75* 52* 38*  CREATININE 2.96* 2.83*  --  2.55* 2.64* 2.66*  CALCIUM 10.1 10.3  --  9.1 8.9 8.7*  MG  --   --   --  1.8  --  1.6*  PHOS  --   --  5.4* 4.1  --   --       Liver Function Tests: Recent Labs  Lab 10/10/20 1016  AST 18  ALT 13  ALKPHOS 92  BILITOT 0.5  PROT 7.4  ALBUMIN 4.0    No results for input(s): LIPASE, AMYLASE in the last 168 hours. Recent Labs  Lab 10/10/20 1500  AMMONIA 9     CBC: Recent Labs  Lab 10/10/20 1016 10/11/20 0427 10/12/20 0447 10/13/20 0424 10/14/20 0444  WBC 3.8* 3.3* 2.7* 2.7* 2.1*  NEUTROABS  --   --  1.8 1.4*  --   HGB 9.0* 8.5* 8.5* 7.7* 7.1*  HCT 26.6* 24.8* 24.4* 21.9* 20.2*  MCV 101.9* 97.3 100.4* 97.3 99.0  PLT 161 157 161 142* 117*     Cardiac Enzymes: No results for input(s): CKTOTAL, CKMB, CKMBINDEX, TROPONINI in the last 168 hours.  BNP: Invalid input(s): POCBNP  CBG: Recent Labs  Lab 10/13/20 0730 10/13/20 1321 10/13/20 1608 10/13/20 2109 10/14/20 0738  GLUCAP 132* 112* 145* 167* 106*     Microbiology: Results for orders placed or performed during the hospital encounter of 10/10/20  Resp Panel by RT-PCR (Flu A&B, Covid) Nasopharyngeal Swab     Status: None   Collection Time: 10/10/20  2:45 PM   Specimen: Nasopharyngeal Swab; Nasopharyngeal(NP) swabs in vial transport medium  Result Value Ref Range Status   SARS Coronavirus 2 by RT PCR NEGATIVE NEGATIVE Final    Comment: (NOTE) SARS-CoV-2 target nucleic acids are NOT DETECTED.  The SARS-CoV-2 RNA is generally detectable in upper respiratory specimens during the acute phase of infection. The lowest concentration of SARS-CoV-2 viral copies this assay can detect is 138 copies/mL. A negative result does not preclude SARS-Cov-2 infection and should not be used as the sole basis for treatment or other patient management decisions. A negative result may occur with  improper specimen collection/handling, submission of specimen other than nasopharyngeal swab, presence of viral mutation(s) within the areas targeted by this assay, and inadequate number of viral copies(<138 copies/mL). A negative result must be  combined with clinical observations, patient history, and epidemiological information. The expected result is Negative.  Fact Sheet for Patients:  EntrepreneurPulse.com.au  Fact Sheet for Healthcare Providers:  IncredibleEmployment.be  This test is no t yet approved or cleared by the [place] FDA and  has been authorized for detection and/or diagnosis of SARS-CoV-2 by FDA under an Emergency Use Authorization (EUA). This EUA will remain  in effect (meaning this test can be used) for the duration of the COVID-19 declaration under Section 564(b)(1) of the Act, 21 U.S.C.section 360bbb-3(b)(1), unless the authorization is terminated  or revoked sooner.       Influenza A by PCR NEGATIVE NEGATIVE Final   Influenza B by PCR NEGATIVE NEGATIVE Final    Comment: (NOTE) The Xpert Xpress SARS-CoV-2/FLU/RSV plus assay is intended as an aid in the diagnosis of influenza from Nasopharyngeal swab specimens and should not be used as a sole basis for treatment. Nasal washings and aspirates are unacceptable for Xpert Xpress SARS-CoV-2/FLU/RSV testing.  Fact Sheet for Patients: EntrepreneurPulse.com.au  Fact Sheet for Healthcare Providers: IncredibleEmployment.be  This test is not yet approved or cleared by the [place] FDA and has been authorized for detection and/or diagnosis of SARS-CoV-2 by FDA under an Emergency Use Authorization (EUA). This EUA will remain in effect (meaning this test can be used) for the duration of the COVID-19 declaration under Section 564(b)(1) of the Act, 21 U.S.C. section 360bbb-3(b)(1), unless the authorization is terminated or revoked.  Performed at [facility], [address] [place], [place] [address]     Coagulation Studies: No results for input(s): LABPROT, INR in the last 72 hours.   Urinalysis: Recent Labs    10/12/20 0315  COLORURINE YELLOW*  LABSPEC  1.013  PHURINE 5.0  GLUCOSEU NEGATIVE  HGBUR NEGATIVE  BILIRUBINUR NEGATIVE  KETONESUR NEGATIVE  PROTEINUR NEGATIVE  NITRITE NEGATIVE  LEUKOCYTESUR NEGATIVE       Imaging: No results found.   Medications:     aspirin EC  81 mg Oral Daily   atorvastatin  40 mg Oral QHS   Chlorhexidine Gluconate Cloth  6 each Topical Daily   escitalopram  20 mg Oral Daily   feeding supplement (NEPRO CARB STEADY)  1,190 mL Per J Tube Q24H   free water  30 mL Per Tube Q4H   gabapentin  100 mg Oral BID   heparin  5,000 Units Subcutaneous Q8H   insulin aspart  0-5 Units Subcutaneous QHS   insulin aspart  0-9 Units Subcutaneous TID WC   melatonin  10 mg Oral QHS   metoCLOPramide  5 mg Oral TID AC   metoprolol tartrate  25 mg Oral BID   mirtazapine  15 mg Oral QHS   multivitamin  1 tablet Oral QHS   mycophenolate  1,000 mg Oral BID   pantoprazole  40 mg Oral Daily   predniSONE  17.5 mg Oral Daily   sulfamethoxazole-trimethoprim  1 tablet Oral Once per day on Mon Wed Fri   tacrolimus  7 mg Oral BID   traZODone  150 mg Oral QHS   valGANciclovir  450 mg Oral Once per day on Mon Thu   acetaminophen, albuterol, dextromethorphan-guaiFENesin, hydrALAZINE, hydrOXYzine, nitroGLYCERIN, ondansetron (ZOFRAN) IV, senna-docusate  Assessment/ Plan:  65 y.o. male with past medical history of liver transplant on 02/18/2020 at [facility], hypertension, coronary artery disease, hyperlipidemia, [name] with cirrhosis, acute kidney injury requiring hemodialysis, history of tracheostomy placement, history of pancreatic pseudocyst, history of chyle leak status post drain placement, history of bacteremia with Staphylococcus hemolyticus who presents now with worsening uremic symptoms.  1.  End stage renal disease with hyperkalemia requiring dialysis.  Patient found to have hyperkalemia along with hyponatremia as well as metabolic encephalopathy.  He recently took him off of dialysis as he had a  creatinine clearance of greater than 15.  However it appears that these estimations may actually be over estimations of his true renal function.  After further consideration, patient is considered ESRD as of 10/12/20 based on labs and presentation.Dialysis reinitiated 10/11/2020. Received third dialysis treatment yesterday, tolerated well. No UF. Next treatment scheduled for Saturday to correlate with proposed outpatient schedule. Dialysis coordinator arranging outpatient clinic. Pending acceptance at [facility], Financial clearance granted. Outpatient clinic arranged at above clinic on TTS schedule at 11:15. First treatment Saturday. Cleared to discharge from renal stance and resume dialysis at outpatient clinic.   2.  Anemia of chronic kidney disease.  Hemoglobin 7.1.  Low dose EPO with treatment.  3.  Status post liver transplant 02/18/2020.  Patient will be maintained on tacrolimus, prednisone, and mycophenolate at their current doses.    LOS: 4 [place] 7/7/20229:30 AM

## 2020-10-14 NOTE — Progress Notes (Signed)
Physical Therapy Treatment Patient Details Name: Alex Hampton MRN: [id number] DOB: 04/24/1955 Today's Date: 10/14/2020    History of Present Illness Pt is a 65 y.o. male presenting to hospital 7/3 with increasing weakness and diarrhea.  Pt with h/o liver transplant at [facility] November 2021.  Pt admitted with uremia and h/o ESRD (pt off dialysis for about 2 weeks), chronic hallucinations, htn, hyperkalemia, elevated troponin, and G tube feedings.  PMH includes ESRD on dialysis, htn, MI, DM, anemia, cirrhosis of liver with ascites, OSA, L humerus fx 10/17/2019.    PT Comments    Pt resting in bed upon PT arrival; NPO for procedure today (d/t feeding tube occluded).  Modified independent semi-supine to sitting edge of bed; CGA with transfers using RW; and CGA ambulating 100 feet with RW (limited distance d/t reports of generalized weakness).  Pt c/o mild lightheadedness/dizziness initially when getting up OOB and with walking (BP 100/68 with HR 64 bpm in sitting prior to walking;  BP 122/77 with HR 76 bpm post ambulation).  Nurse notified regarding pt's vitals and symptoms.  Will continue to focus on strengthening, balance, and progressive functional mobility during hospitalization.  Pt reports he has 24/7 assist set up from live in aides.      Follow Up Recommendations  Home health PT;Supervision/Assistance - 24 hour     Equipment Recommendations  Rolling walker with 5" wheels;3in1 (PT)    Recommendations for Other Services       Precautions / Restrictions Precautions Precautions: Fall Restrictions Weight Bearing Restrictions: No    Mobility  Bed Mobility Overal bed mobility: Modified Independent Bed Mobility: Supine to Sit     Supine to sit: Modified independent (Device/Increase time)     General bed mobility comments: Mild increased effort/time to perform on own    Transfers Overall transfer level: Needs assistance Equipment used: Rolling walker (2 wheeled) Transfers: Sit  to/from [facility] Sit to Stand: Min guard Stand pivot transfers: Min guard (stand step turn bed to recliner with RW)       General transfer comment: mild increased effort to stand up to RW (x1 trial from bed and x1 trial from recliner)  Ambulation/Gait Ambulation/Gait assistance: Min guard Gait Distance (Feet): 100 Feet Assistive device: Rolling walker (2 wheeled)   Gait velocity: decreased   General Gait Details: partial step through gait pattern; occasionally veering a little towards L with walker but able to self correct; chair follow for safety   Stairs             Wheelchair Mobility    Modified Rankin (Stroke Patients Only)       Balance Overall balance assessment: Needs assistance Sitting-balance support: No upper extremity supported;Feet supported Sitting balance-Leahy Scale: Normal Sitting balance - Comments: steady sitting reaching outside BOS   Standing balance support: Single extremity supported Standing balance-Leahy Scale: Fair Standing balance comment: steady standing with at least single UE support                            Cognition Arousal/Alertness: Awake/alert Behavior During Therapy: WFL for tasks assessed/performed Overall Cognitive Status: Within Functional Limits for tasks assessed                                 General Comments: Mild increased time to respond at times      Exercises General Exercises - Lower Extremity  Long Arc Quad: AROM;Strengthening;Both;10 reps;Seated Hip Flexion/Marching: AROM;Strengthening;Both;10 reps;Seated    General Comments  Nursing cleared pt for participation in physical therapy.  Pt agreeable to PT session.      Pertinent Vitals/Pain Pain Assessment: No/denies pain Pain Intervention(s): Limited activity within patient's tolerance;Monitored during session;Repositioned O2 sats WFL on room air during sessions activities.    Home Living                       Prior Function            PT Goals (current goals can now be found in the care plan section) Acute Rehab PT Goals Patient Stated Goal: To feel better and go home PT Goal Formulation: With patient Time For Goal Achievement: 10/27/20 Potential to Achieve Goals: Good Progress towards PT goals: Progressing toward goals    Frequency    Min 2X/week      PT Plan      Co-evaluation              AM-PAC PT "6 Clicks" Mobility   Outcome Measure  Help needed turning from your back to your side while in a flat bed without using bedrails?: None Help needed moving from lying on your back to sitting on the side of a flat bed without using bedrails?: None Help needed moving to and from a bed to a chair (including a wheelchair)?: A Little Help needed standing up from a chair using your arms (e.g., wheelchair or bedside chair)?: A Little Help needed to walk in hospital room?: A Little Help needed climbing 3-5 steps with a railing? : A Little 6 Click Score: 20    End of Session Equipment Utilized During Treatment: Gait belt Activity Tolerance: Patient limited by fatigue Patient left: in chair;with call bell/phone within reach;with chair alarm set Nurse Communication: Mobility status;Precautions;Other (comment) (pt's vitals and symptoms during session) PT Visit Diagnosis: Other abnormalities of gait and mobility (R26.89);Muscle weakness (generalized) (M62.81);History of falling (Z91.81)     Time: 1638-4536 PT Time Calculation (min) (ACUTE ONLY): 34 min  Charges:  $Therapeutic Exercise: 8-22 mins $Therapeutic Activity: 8-22 mins                     Leitha Bleak, PT 10/14/20, 10:48 AM

## 2020-10-14 NOTE — Discharge Summary (Addendum)
Physician Discharge Summary   Alex KROTZER  male DOB: March 16, 1956  UEA:[id number]  PCP: Juluis Pitch, MD  Admit date: 10/10/2020 Discharge date: 10/14/2020  Admitted From: home Disposition:  home Home Health: Yes CODE STATUS: Full code   Hospital Course:  For full details, please see H&P, progress notes, consult notes and ancillary notes.  Briefly,  Alex Hampton is a 65 y.o. male with medical history significant of hypertension, hyperlipidemia, GERD, depression with anxiety, CAD, stent placement, anemia, liver cirrhosis with ascites (s/p of liver transplantation in [facility], on immunosuppressant), ESRD, OSA not on CPAP, G-tube, who presented with generalized weakness.    Upon arriving the emergency room, he was found to have significant hyperkalemia.  Potassium went up to 6.4.  Creatinine 2.83.  Nephrology consulted.  #1.  End-stage renal disease. Pt recently taken off of dialysis as he had a creatinine clearance of greater than 15.  However it appeared that these estimations may actually be over estimations of his true renal function.  After further consideration, patient was considered ESRD as of 10/12/20 based on labs and presentation.  Dialysis reinitiated 10/11/2020.  Outpt hemodialysis seat has been secured for TTS at [place].  Cleared to discharge from renal stance and resume dialysis at outpatient clinic.   Hyperkalemia, resolved --2/2 to ESRD, resolved with dialysis   Hyponatremia, mild   Metabolic encephalopathy due to uremia, resolved --mental status back to baseline   History of liver transplant.   --cont home cellcept, prograf and prednisone --cont Bactrim and Valcyte for ppx  Type 2 diabetes, well controlled A1c 6.4.   --cont SSI  Elevated troponin. Secondary to renal disease.   Leukopenia and anemia. --not deficient in iron and B12   Nocturnal tube feed Pt is able to eat normally, however, pt believes he can't eat enough to sustain his  nutrition, therefore, wants to continue nocturnal tube feed.  Pt's GJ tube was clogged, and despite de-clogging effort by nursing, it remained clogged, so IR replaced GJ tube on 10/14/20.    Discharge Diagnoses:  Principal Problem:   Uremia Active Problems:   Hallucinations   HTN (hypertension)   Anxiety and depression   ESRD (end stage renal disease) (HCC)   Liver transplant recipient ([place])   Anemia in ESRD (end-stage renal disease) (HCC)   Acute metabolic encephalopathy   Hyperkalemia   Elevated troponin   GERD (gastroesophageal reflux disease)   CAD (coronary artery disease)   G tube feedings (HCC)   Diarrhea   Type II diabetes mellitus with renal manifestations ([place])   30 Day Unplanned Readmission Risk Score    Flowsheet Row ED to Hosp-Admission (Current) from 10/10/2020 in [facility]  30 Day Unplanned Readmission Risk Score (%) 34.04 Filed at 10/14/2020 1200       This score is the patient's risk of an unplanned readmission within 30 days of being discharged (0 -100%). The score is based on dignosis, age, lab data, medications, orders, and past utilization.   Low:  0-14.9   Medium: 15-21.9   High: 22-29.9   Extreme: 30 and above          Discharge Instructions:  Allergies as of 10/14/2020   No Known Allergies      Medication List     TAKE these medications    acetaminophen 325 MG tablet Commonly known as: TYLENOL Take 650 mg by mouth every 8 (eight) hours as needed for mild pain or moderate pain.  aspirin 81 MG EC tablet Take 81 mg by mouth daily.   atorvastatin 40 MG tablet Commonly known as: LIPITOR Take 40 mg by mouth at bedtime.   clopidogrel 75 MG tablet Commonly known as: PLAVIX Take 1 tablet (75 mg total) by mouth daily.   escitalopram 20 MG tablet Commonly known as: LEXAPRO Take 20 mg by mouth daily.   gabapentin 100 MG capsule Commonly known as: NEURONTIN Take 100 mg by mouth 2 (two) times daily.   hydrOXYzine  25 MG capsule Commonly known as: VISTARIL Take 25 mg by mouth every 6 (six) hours as needed for anxiety or itching.   melatonin 3 MG Tabs tablet Take 9 mg by mouth at bedtime.   metoCLOPramide 5 MG tablet Commonly known as: REGLAN Take 5 mg by mouth 3 (three) times daily before meals.   metoprolol tartrate 25 MG tablet Commonly known as: LOPRESSOR Take 1 tablet (25 mg total) by mouth 2 (two) times daily.   mirtazapine 15 MG tablet Commonly known as: REMERON Take 15 mg by mouth at bedtime.   mycophenolate 250 MG capsule Commonly known as: CELLCEPT Take 1,000 mg by mouth 2 (two) times daily.   nitroGLYCERIN 0.4 MG SL tablet Commonly known as: NITROSTAT Place 1 tablet (0.4 mg total) under the tongue every 5 (five) minutes as needed for chest pain.   NovaSource Renal Liqd Give by tube. Novasource Renal @ 75 ml/hr 8 hours /night (10 pm -8 am)   NovoLIN R 100 units/mL injection Generic drug: insulin regular Inject 0-11 Units into the skin See admin instructions. Take 6 units at 6pm plus correction with meal time. Correction scale with meals.  Glucose Range  <200 none, 201 - 250 mg/dL 1 unit, 251 - 300 mg/dL 2 units, 301 - 350 mg/dL 3 units, 351- 400 mg/dl 4 units , >400 mg/dL take 5 units and call your provider   pantoprazole 40 MG tablet Commonly known as: PROTONIX Take 1 tablet (40 mg total) by mouth daily.   predniSONE 5 MG tablet Commonly known as: DELTASONE Take 17.5 mg by mouth daily.   senna-docusate 8.6-50 MG tablet Commonly known as: Senokot-S Take 2 tablets by mouth 2 (two) times daily as needed for mild constipation or moderate constipation.   sulfamethoxazole-trimethoprim 800-160 MG tablet Commonly known as: BACTRIM DS Take 1 tablet by mouth every Monday, Wednesday, and Friday.   tacrolimus 1 MG capsule Commonly known as: PROGRAF Take 7 mg by mouth 2 (two) times daily.   traZODone 150 MG tablet Commonly known as: DESYREL Take 150 mg by mouth at bedtime.    valGANciclovir 450 MG tablet Commonly known as: VALCYTE Take 450 mg by mouth 2 (two) times a week. (Monday and Thursday) with largest meal of the day.         Follow-up Information     Juluis Pitch, MD Follow up in 1 week(s).   Specialty: Family Medicine Contact information: [address] [address] [phone]                 No Known Allergies   The results of significant diagnostics from this hospitalization (including imaging, microbiology, ancillary and laboratory) are listed below for reference.   Consultations:   Procedures/Studies: DG Chest 2 View  Result Date: 10/10/2020 CLINICAL DATA:  Shortness of breath. History of liver transplant 01 March 2020. End-stage renal disease on dialysis. Weakness. EXAM: CHEST - 2 VIEW COMPARISON:  08/19/2020 FINDINGS: Patient has RIGHT-sided dialysis catheter, tip overlying the  level of the LOWER superior vena/UPPER RIGHT atrium. Heart size is normal. There has been improvement in aeration of the LOWER lobes since the previous exam. Nearly resolved bilateral pleural effusions. No edema. Partially imaged probable gastrojejunostomy. IMPRESSION: Improved aeration in the LOWER lobes. Minimal scarring or atelectasis persists at the lung bases. Almost completely resolved bilateral pleural effusions. Electronically Signed   By: Nolon Nations M.D.   On: 10/10/2020 15:32   DG Abd 1 View  Result Date: 10/14/2020 CLINICAL DATA:  Dislodgement of gastrojejunostomy tube. EXAM: ABDOMEN - 1 VIEW COMPARISON:  None. FINDINGS: The bowel gas pattern is normal. Distal tip of gastrostomy tube appears to be in grossly good position in the left upper quadrant. No radio-opaque calculi or other significant radiographic abnormality are seen. IMPRESSION: Gastrostomy tube tip seen in left upper quadrant. No abnormal bowel dilatation is noted. Electronically Signed   By: Marijo Conception M.D.   On: 10/14/2020 13:59      Labs: BNP (last 3  results) Recent Labs    12/10/19 0802 08/19/20 0643 10/10/20 1016  BNP 74.3 561.3* 161.0*   Basic Metabolic Panel: Recent Labs  Lab 10/10/20 1016 10/11/20 0427 10/11/20 1447 10/12/20 0447 10/13/20 0424 10/14/20 0444  NA 131* 132*  --  131* 132* 134*  K 5.7* 6.4*  --  5.9* 4.3 4.0  CL 98 100  --  97* 95* 95*  CO2 23 22  --  26 30 29   GLUCOSE 195* 154*  --  222* 123* 117*  BUN 102* 99*  --  75* 52* 38*  CREATININE 2.96* 2.83*  --  2.55* 2.64* 2.66*  CALCIUM 10.1 10.3  --  9.1 8.9 8.7*  MG  --   --   --  1.8  --  1.6*  PHOS  --   --  5.4* 4.1  --   --    Liver Function Tests: Recent Labs  Lab 10/10/20 1016  AST 18  ALT 13  ALKPHOS 92  BILITOT 0.5  PROT 7.4  ALBUMIN 4.0   No results for input(s): LIPASE, AMYLASE in the last 168 hours. Recent Labs  Lab 10/10/20 1500  AMMONIA 9   CBC: Recent Labs  Lab 10/10/20 1016 10/11/20 0427 10/12/20 0447 10/13/20 0424 10/14/20 0444  WBC 3.8* 3.3* 2.7* 2.7* 2.1*  NEUTROABS  --   --  1.8 1.4*  --   HGB 9.0* 8.5* 8.5* 7.7* 7.1*  HCT 26.6* 24.8* 24.4* 21.9* 20.2*  MCV 101.9* 97.3 100.4* 97.3 99.0  PLT 161 157 161 142* 117*   Cardiac Enzymes: No results for input(s): CKTOTAL, CKMB, CKMBINDEX, TROPONINI in the last 168 hours. BNP: Invalid input(s): POCBNP CBG: Recent Labs  Lab 10/13/20 1321 10/13/20 1608 10/13/20 2109 10/14/20 0738 10/14/20 1141  GLUCAP 112* 145* 167* 106* 107*   D-Dimer No results for input(s): DDIMER in the last 72 hours. Hgb A1c No results for input(s): HGBA1C in the last 72 hours. Lipid Profile No results for input(s): CHOL, HDL, LDLCALC, TRIG, CHOLHDL, LDLDIRECT in the last 72 hours. Thyroid function studies No results for input(s): TSH, T4TOTAL, T3FREE, THYROIDAB in the last 72 hours.  Invalid input(s): FREET3 Anemia work up Recent Labs    10/12/20 0447 10/12/20 1220  VITAMINB12  --  959*  TIBC 332  --   IRON 151  --    Urinalysis    Component Value Date/Time   COLORURINE  YELLOW (A) 10/12/2020 0315   APPEARANCEUR HAZY (A) 10/12/2020 0315   LABSPEC  1.013 10/12/2020 0315   PHURINE 5.0 10/12/2020 0315   GLUCOSEU NEGATIVE 10/12/2020 0315   HGBUR NEGATIVE 10/12/2020 0315   BILIRUBINUR NEGATIVE 10/12/2020 0315   KETONESUR NEGATIVE 10/12/2020 0315   PROTEINUR NEGATIVE 10/12/2020 0315   NITRITE NEGATIVE 10/12/2020 0315   LEUKOCYTESUR NEGATIVE 10/12/2020 0315   Sepsis Labs Invalid input(s): PROCALCITONIN,  WBC,  LACTICIDVEN Microbiology Recent Results (from the past 240 hour(s))  Resp Panel by RT-PCR (Flu A&B, Covid) Nasopharyngeal Swab     Status: None   Collection Time: 10/10/20  2:45 PM   Specimen: Nasopharyngeal Swab; Nasopharyngeal(NP) swabs in vial transport medium  Result Value Ref Range Status   SARS Coronavirus 2 by RT PCR NEGATIVE NEGATIVE Final    Comment: (NOTE) SARS-CoV-2 target nucleic acids are NOT DETECTED.  The SARS-CoV-2 RNA is generally detectable in upper respiratory specimens during the acute phase of infection. The lowest concentration of SARS-CoV-2 viral copies this assay can detect is 138 copies/mL. A negative result does not preclude SARS-Cov-2 infection and should not be used as the sole basis for treatment or other patient management decisions. A negative result may occur with  improper specimen collection/handling, submission of specimen other than nasopharyngeal swab, presence of viral mutation(s) within the areas targeted by this assay, and inadequate number of viral copies(<138 copies/mL). A negative result must be combined with clinical observations, patient history, and epidemiological information. The expected result is Negative.  Fact Sheet for Patients:  EntrepreneurPulse.com.au  Fact Sheet for Healthcare Providers:  IncredibleEmployment.be  This test is no t yet approved or cleared by the [place] FDA and  has been authorized for detection and/or diagnosis of SARS-CoV-2  by FDA under an Emergency Use Authorization (EUA). This EUA will remain  in effect (meaning this test can be used) for the duration of the COVID-19 declaration under Section 564(b)(1) of the Act, 21 U.S.C.section 360bbb-3(b)(1), unless the authorization is terminated  or revoked sooner.       Influenza A by PCR NEGATIVE NEGATIVE Final   Influenza B by PCR NEGATIVE NEGATIVE Final    Comment: (NOTE) The Xpert Xpress SARS-CoV-2/FLU/RSV plus assay is intended as an aid in the diagnosis of influenza from Nasopharyngeal swab specimens and should not be used as a sole basis for treatment. Nasal washings and aspirates are unacceptable for Xpert Xpress SARS-CoV-2/FLU/RSV testing.  Fact Sheet for Patients: EntrepreneurPulse.com.au  Fact Sheet for Healthcare Providers: IncredibleEmployment.be  This test is not yet approved or cleared by the [place] FDA and has been authorized for detection and/or diagnosis of SARS-CoV-2 by FDA under an Emergency Use Authorization (EUA). This EUA will remain in effect (meaning this test can be used) for the duration of the COVID-19 declaration under Section 564(b)(1) of the Act, 21 U.S.C. section 360bbb-3(b)(1), unless the authorization is terminated or revoked.  Performed at [facility], [address] [place], [place] [address]      Total time spend on discharging this patient, including the last patient exam, discussing the hospital stay, instructions for ongoing care as it relates to all pertinent caregivers, as well as preparing the medical discharge records, prescriptions, and/or referrals as applicable, is 40 minutes.    Enzo Bi, MD  [facility] 10/14/2020, 3:22 PM

## 2020-10-14 NOTE — Procedures (Signed)
Interventional Radiology Procedure Note  Procedure: GJ tube replacement under fluoro  Complications: None  Estimated Blood Loss: None  Findings: Occluded 18 Fr GJ tube removed. 5 Fr catheter introduced and advanced through stomach, duodenum and into jejunum.  New 18 Fr balloon retention GJ tube advanced over wire. Contrast injection confirms tip position in proximal jejunum.  Venetia Night. Kathlene Cote, M.D Pager:  [phone]

## 2020-10-14 NOTE — TOC Progression Note (Signed)
Transition of Care [facility]) - Progression Note    Patient Details  Name: Alex Hampton MRN: [id number] Date of Birth: 06/16/1955  Transition of Care [facility]) CM/SW Contact  Eileen Stanford, LCSW Phone Number: 10/14/2020, 10:17 AM  Clinical Narrative:   CSW spoke with pt's daughter. [name] states pt has [facility] [place] at home already and he knows he has to contact the agency when he gets back home to resume schedule.    Expected Discharge Plan: Home/Self Care Barriers to Discharge: Continued Medical Work up  Expected Discharge Plan and Services Expected Discharge Plan: Home/Self Care       Living arrangements for the past 2 months: Single Family Home                 DME Arranged: Tub bench DME Agency: [employer]                   Social Determinants of Health (SDOH) Interventions    Readmission Risk Interventions Readmission Risk Prevention Plan 10/11/2020  Transportation Screening Complete  PCP or Specialist Appt within 3-5 Days Complete  HRI or [place] Complete  Social Work Consult for [place] Planning/Counseling Complete  Palliative Care Screening Not Applicable  Medication Review Press photographer) Complete  Some recent data might be hidden

## 2020-10-14 NOTE — Progress Notes (Signed)
OT Cancellation Note  Patient Details Name: Alex Hampton MRN: [id number] DOB: 1955-10-15   Cancelled Treatment:    Reason Eval/Treat Not Completed: Patient at procedure or test/ unavailable Pt OTF to IR for tube replacement at this time. Will f/u for OT tx at later date/time as able/pt available. Thank you.  Gerrianne Scale, [place], OTR/L ascom [phone] 10/14/20, 1:57 PM

## 2020-10-14 NOTE — Plan of Care (Signed)
Pt reports he is feeling better after resuming Dialysis treatments.  He denies pain.  Jport of feeding tube remains occluded so tube feedings are on hold.  Potentially plan to replace tube later today.  Ayesha Mohair BSN RN CMSRN   Problem: Nutrition: Goal: Adequate nutrition will be maintained Outcome: Progressing

## 2020-10-14 NOTE — Plan of Care (Signed)

## 2020-10-14 NOTE — Progress Notes (Signed)
Patient accepted at [facility] 11:15. Patient is known at this clinic previously for AKI treatments, therefore clinic has agreed to start patient on Saturday 7/9 at 10:45.

## 2020-10-15 ENCOUNTER — Other Ambulatory Visit: Payer: Self-pay

## 2020-10-15 ENCOUNTER — Inpatient Hospital Stay
Admission: EM | Admit: 2020-10-15 | Discharge: 2020-10-21 | DRG: 312 | Disposition: A | Discharge status: Home Health | Payer: Medicare Other | Attending: Internal Medicine | Admitting: Internal Medicine

## 2020-10-15 DIAGNOSIS — Z992 Dependence on renal dialysis: Secondary | ICD-10-CM

## 2020-10-15 DIAGNOSIS — I5022 Chronic systolic (congestive) heart failure: Secondary | ICD-10-CM | POA: Diagnosis present

## 2020-10-15 DIAGNOSIS — K219 Gastro-esophageal reflux disease without esophagitis: Secondary | ICD-10-CM | POA: Diagnosis present

## 2020-10-15 DIAGNOSIS — R55 Syncope and collapse: Principal | ICD-10-CM

## 2020-10-15 DIAGNOSIS — E1142 Type 2 diabetes mellitus with diabetic polyneuropathy: Secondary | ICD-10-CM | POA: Diagnosis not present

## 2020-10-15 DIAGNOSIS — E1122 Type 2 diabetes mellitus with diabetic chronic kidney disease: Secondary | ICD-10-CM | POA: Diagnosis present

## 2020-10-15 DIAGNOSIS — I6523 Occlusion and stenosis of bilateral carotid arteries: Secondary | ICD-10-CM | POA: Diagnosis present

## 2020-10-15 DIAGNOSIS — Z9049 Acquired absence of other specified parts of digestive tract: Secondary | ICD-10-CM

## 2020-10-15 DIAGNOSIS — N186 End stage renal disease: Secondary | ICD-10-CM

## 2020-10-15 DIAGNOSIS — R109 Unspecified abdominal pain: Secondary | ICD-10-CM

## 2020-10-15 DIAGNOSIS — E1165 Type 2 diabetes mellitus with hyperglycemia: Secondary | ICD-10-CM | POA: Diagnosis present

## 2020-10-15 DIAGNOSIS — Z955 Presence of coronary angioplasty implant and graft: Secondary | ICD-10-CM

## 2020-10-15 DIAGNOSIS — E1129 Type 2 diabetes mellitus with other diabetic kidney complication: Secondary | ICD-10-CM

## 2020-10-15 DIAGNOSIS — D61818 Other pancytopenia: Secondary | ICD-10-CM | POA: Diagnosis present

## 2020-10-15 DIAGNOSIS — E43 Unspecified severe protein-calorie malnutrition: Secondary | ICD-10-CM | POA: Diagnosis present

## 2020-10-15 DIAGNOSIS — F32A Depression, unspecified: Secondary | ICD-10-CM | POA: Diagnosis present

## 2020-10-15 DIAGNOSIS — T8649 Other complications of liver transplant: Secondary | ICD-10-CM | POA: Diagnosis present

## 2020-10-15 DIAGNOSIS — I2510 Atherosclerotic heart disease of native coronary artery without angina pectoris: Secondary | ICD-10-CM | POA: Diagnosis present

## 2020-10-15 DIAGNOSIS — G4733 Obstructive sleep apnea (adult) (pediatric): Secondary | ICD-10-CM | POA: Diagnosis present

## 2020-10-15 DIAGNOSIS — K7460 Unspecified cirrhosis of liver: Secondary | ICD-10-CM | POA: Diagnosis present

## 2020-10-15 DIAGNOSIS — I959 Hypotension, unspecified: Secondary | ICD-10-CM | POA: Diagnosis not present

## 2020-10-15 DIAGNOSIS — T387X5A Adverse effect of androgens and anabolic congeners, initial encounter: Secondary | ICD-10-CM | POA: Diagnosis present

## 2020-10-15 DIAGNOSIS — E860 Dehydration: Secondary | ICD-10-CM | POA: Diagnosis present

## 2020-10-15 DIAGNOSIS — I252 Old myocardial infarction: Secondary | ICD-10-CM

## 2020-10-15 DIAGNOSIS — R112 Nausea with vomiting, unspecified: Secondary | ICD-10-CM | POA: Diagnosis present

## 2020-10-15 DIAGNOSIS — R140 Abdominal distension (gaseous): Secondary | ICD-10-CM

## 2020-10-15 DIAGNOSIS — E875 Hyperkalemia: Secondary | ICD-10-CM | POA: Diagnosis present

## 2020-10-15 DIAGNOSIS — E878 Other disorders of electrolyte and fluid balance, not elsewhere classified: Secondary | ICD-10-CM | POA: Diagnosis present

## 2020-10-15 DIAGNOSIS — Z79899 Other long term (current) drug therapy: Secondary | ICD-10-CM

## 2020-10-15 DIAGNOSIS — D84821 Immunodeficiency due to drugs: Secondary | ICD-10-CM | POA: Diagnosis present

## 2020-10-15 DIAGNOSIS — Z6820 Body mass index (BMI) 20.0-20.9, adult: Secondary | ICD-10-CM

## 2020-10-15 DIAGNOSIS — E785 Hyperlipidemia, unspecified: Secondary | ICD-10-CM | POA: Diagnosis not present

## 2020-10-15 DIAGNOSIS — D631 Anemia in chronic kidney disease: Secondary | ICD-10-CM | POA: Diagnosis present

## 2020-10-15 DIAGNOSIS — Z800 Family history of malignant neoplasm of digestive organs: Secondary | ICD-10-CM

## 2020-10-15 DIAGNOSIS — I132 Hypertensive heart and chronic kidney disease with heart failure and with stage 5 chronic kidney disease, or end stage renal disease: Secondary | ICD-10-CM | POA: Diagnosis present

## 2020-10-15 DIAGNOSIS — Z794 Long term (current) use of insulin: Secondary | ICD-10-CM

## 2020-10-15 DIAGNOSIS — G9341 Metabolic encephalopathy: Secondary | ICD-10-CM | POA: Diagnosis present

## 2020-10-15 DIAGNOSIS — E1140 Type 2 diabetes mellitus with diabetic neuropathy, unspecified: Secondary | ICD-10-CM | POA: Diagnosis present

## 2020-10-15 DIAGNOSIS — K9423 Gastrostomy malfunction: Secondary | ICD-10-CM | POA: Diagnosis not present

## 2020-10-15 DIAGNOSIS — R64 Cachexia: Secondary | ICD-10-CM | POA: Diagnosis present

## 2020-10-15 DIAGNOSIS — N2581 Secondary hyperparathyroidism of renal origin: Secondary | ICD-10-CM | POA: Diagnosis present

## 2020-10-15 DIAGNOSIS — Y830 Surgical operation with transplant of whole organ as the cause of abnormal reaction of the patient, or of later complication, without mention of misadventure at the time of the procedure: Secondary | ICD-10-CM | POA: Diagnosis present

## 2020-10-15 DIAGNOSIS — Z87891 Personal history of nicotine dependence: Secondary | ICD-10-CM

## 2020-10-15 DIAGNOSIS — Z20822 Contact with and (suspected) exposure to covid-19: Secondary | ICD-10-CM | POA: Diagnosis present

## 2020-10-15 DIAGNOSIS — R110 Nausea: Secondary | ICD-10-CM

## 2020-10-15 DIAGNOSIS — Z7982 Long term (current) use of aspirin: Secondary | ICD-10-CM

## 2020-10-15 DIAGNOSIS — F418 Other specified anxiety disorders: Secondary | ICD-10-CM | POA: Diagnosis present

## 2020-10-15 DIAGNOSIS — E871 Hypo-osmolality and hyponatremia: Secondary | ICD-10-CM | POA: Diagnosis present

## 2020-10-15 DIAGNOSIS — Z7952 Long term (current) use of systemic steroids: Secondary | ICD-10-CM

## 2020-10-15 DIAGNOSIS — Z7902 Long term (current) use of antithrombotics/antiplatelets: Secondary | ICD-10-CM

## 2020-10-15 DIAGNOSIS — Z833 Family history of diabetes mellitus: Secondary | ICD-10-CM

## 2020-10-15 LAB — CBC WITH DIFFERENTIAL/PLATELET
Abs Immature Granulocytes: 0.49 10*3/uL — ABNORMAL HIGH (ref 0.00–0.07)
Basophils Absolute: 0 10*3/uL (ref 0.0–0.1)
Basophils Relative: 1 %
Eosinophils Absolute: 0 10*3/uL (ref 0.0–0.5)
Eosinophils Relative: 1 %
HCT: 22.5 % — ABNORMAL LOW (ref 39.0–52.0)
Hemoglobin: 7.8 g/dL — ABNORMAL LOW (ref 13.0–17.0)
Immature Granulocytes: 25 %
Lymphocytes Relative: 8 %
Lymphs Abs: 0.2 10*3/uL — ABNORMAL LOW (ref 0.7–4.0)
MCH: 34.5 pg — ABNORMAL HIGH (ref 26.0–34.0)
MCHC: 34.7 g/dL (ref 30.0–36.0)
MCV: 99.6 fL (ref 80.0–100.0)
Monocytes Absolute: 0.2 10*3/uL (ref 0.1–1.0)
Monocytes Relative: 9 %
Neutro Abs: 1.1 10*3/uL — ABNORMAL LOW (ref 1.7–7.7)
Neutrophils Relative %: 56 %
Platelets: 123 10*3/uL — ABNORMAL LOW (ref 150–400)
RBC: 2.26 MIL/uL — ABNORMAL LOW (ref 4.22–5.81)
RDW: 13.6 % (ref 11.5–15.5)
Smear Review: NORMAL
WBC: 2 10*3/uL — ABNORMAL LOW (ref 4.0–10.5)
nRBC: 0 % (ref 0.0–0.2)

## 2020-10-15 LAB — TROPONIN I (HIGH SENSITIVITY)
Troponin I (High Sensitivity): 37 ng/L — ABNORMAL HIGH (ref <18)
Troponin I (High Sensitivity): 32 ng/L — ABNORMAL HIGH (ref <18)

## 2020-10-15 LAB — RESP PANEL BY RT-PCR (FLU A&B, COVID) ARPGX2
Influenza A by PCR: NEGATIVE
Influenza B by PCR: NEGATIVE
SARS Coronavirus 2 by RT PCR: NEGATIVE

## 2020-10-15 LAB — COMPREHENSIVE METABOLIC PANEL
ALT: 16 U/L (ref 0–44)
AST: 22 U/L (ref 15–41)
Albumin: 3.4 g/dL — ABNORMAL LOW (ref 3.5–5.0)
Alkaline Phosphatase: 60 U/L (ref 38–126)
Anion gap: 13 (ref 5–15)
BUN: 69 mg/dL — ABNORMAL HIGH (ref 8–23)
CO2: 27 mmol/L (ref 22–32)
Calcium: 8.7 mg/dL — ABNORMAL LOW (ref 8.9–10.3)
Chloride: 92 mmol/L — ABNORMAL LOW (ref 98–111)
Creatinine, Ser: 4.04 mg/dL — ABNORMAL HIGH (ref 0.61–1.24)
GFR, Estimated: 16 mL/min — ABNORMAL LOW (ref >60)
Glucose, Bld: 222 mg/dL — ABNORMAL HIGH (ref 70–99)
Potassium: 4.1 mmol/L (ref 3.5–5.1)
Sodium: 132 mmol/L — ABNORMAL LOW (ref 135–145)
Total Bilirubin: 0.6 mg/dL (ref 0.3–1.2)
Total Protein: 6.2 g/dL — ABNORMAL LOW (ref 6.5–8.1)

## 2020-10-15 LAB — MAGNESIUM: Magnesium: 1.9 mg/dL (ref 1.7–2.4)

## 2020-10-15 MED ORDER — ONDANSETRON HCL 4 MG/2ML IJ SOLN
4.0000 mg | Freq: Four times a day (QID) | INTRAMUSCULAR | Status: DC | PRN
  Administered 2020-10-16: 4 mg via INTRAVENOUS
  Filled 2020-10-15: qty 2

## 2020-10-15 MED ORDER — CLOPIDOGREL BISULFATE 75 MG PO TABS
75.0000 mg | ORAL_TABLET | Freq: Every day | ORAL | Status: DC
  Administered 2020-10-16: 75 mg via ORAL
  Filled 2020-10-15: qty 1

## 2020-10-15 MED ORDER — PANTOPRAZOLE SODIUM 40 MG PO TBEC: 40.0000 mg | DELAYED_RELEASE_TABLET | Freq: Every day | ORAL | Status: DC

## 2020-10-15 MED ORDER — TACROLIMUS 1 MG PO CAPS
7.0000 mg | ORAL_CAPSULE | Freq: Two times a day (BID) | ORAL | Status: DC
  Administered 2020-10-16 – 2020-10-20 (×11): 7 mg via ORAL
  Filled 2020-10-15 (×15): qty 7

## 2020-10-15 MED ORDER — ESCITALOPRAM OXALATE 20 MG PO TABS
20.0000 mg | ORAL_TABLET | Freq: Every day | ORAL | Status: DC
  Administered 2020-10-16: 20 mg via ORAL
  Filled 2020-10-15: qty 1

## 2020-10-15 MED ORDER — ACETAMINOPHEN 325 MG PO TABS
650.0000 mg | ORAL_TABLET | Freq: Four times a day (QID) | ORAL | Status: DC | PRN
  Administered 2020-10-19: 650 mg via ORAL
  Filled 2020-10-15: qty 2

## 2020-10-15 MED ORDER — ASPIRIN EC 81 MG PO TBEC: 81.0000 mg | DELAYED_RELEASE_TABLET | Freq: Every day | ORAL | Status: DC

## 2020-10-15 MED ORDER — ONDANSETRON HCL 4 MG/2ML IJ SOLN
4.0000 mg | Freq: Once | INTRAMUSCULAR | Status: AC
  Administered 2020-10-15: 4 mg via INTRAVENOUS
  Filled 2020-10-15: qty 2

## 2020-10-15 MED ORDER — NITROGLYCERIN 0.4 MG SL SUBL: 0.4000 mg | SUBLINGUAL_TABLET | SUBLINGUAL | Status: DC | PRN

## 2020-10-15 MED ORDER — MELATONIN 5 MG PO TABS: 10.0000 mg | ORAL_TABLET | Freq: Every day | ORAL | Status: DC

## 2020-10-15 MED ORDER — MIRTAZAPINE 15 MG PO TABS: 15.0000 mg | ORAL_TABLET | Freq: Every day | ORAL | Status: DC

## 2020-10-15 MED ORDER — GABAPENTIN 100 MG PO CAPS: 100.0000 mg | ORAL_CAPSULE | Freq: Two times a day (BID) | ORAL | Status: DC

## 2020-10-15 MED ORDER — HYDROXYZINE PAMOATE 25 MG PO CAPS
25.0000 mg | ORAL_CAPSULE | Freq: Four times a day (QID) | ORAL | Status: DC | PRN
  Filled 2020-10-15: qty 1

## 2020-10-15 MED ORDER — MYCOPHENOLATE MOFETIL 250 MG PO CAPS
1000.0000 mg | ORAL_CAPSULE | Freq: Two times a day (BID) | ORAL | Status: DC
  Administered 2020-10-16 – 2020-10-21 (×12): 1000 mg via ORAL
  Filled 2020-10-15 (×13): qty 4

## 2020-10-15 MED ORDER — TRAZODONE HCL 50 MG PO TABS
25.0000 mg | ORAL_TABLET | Freq: Every evening | ORAL | Status: DC | PRN
  Administered 2020-10-16 – 2020-10-20 (×4): 25 mg via ORAL
  Filled 2020-10-15 (×4): qty 1

## 2020-10-15 MED ORDER — ONDANSETRON HCL 4 MG PO TABS
4.0000 mg | ORAL_TABLET | Freq: Four times a day (QID) | ORAL | Status: DC | PRN
  Administered 2020-10-17: 4 mg via ORAL
  Filled 2020-10-15: qty 1

## 2020-10-15 MED ORDER — SODIUM CHLORIDE 0.9 % IV BOLUS
500.0000 mL | Freq: Once | INTRAVENOUS | Status: AC
  Administered 2020-10-15: 500 mL via INTRAVENOUS

## 2020-10-15 MED ORDER — ATORVASTATIN CALCIUM 20 MG PO TABS
40.0000 mg | ORAL_TABLET | Freq: Every day | ORAL | Status: DC
  Administered 2020-10-16 – 2020-10-20 (×5): 40 mg via ORAL
  Filled 2020-10-15 (×5): qty 2

## 2020-10-15 MED ORDER — HEPARIN SODIUM (PORCINE) 5000 UNIT/ML IJ SOLN: 5000.0000 [IU] | Freq: Three times a day (TID) | INTRAMUSCULAR | Status: DC

## 2020-10-15 MED ORDER — OSMOLITE 1.2 CAL PO LIQD
1000.0000 mL | ORAL | Status: DC
  Administered 2020-10-16: 1000 mL

## 2020-10-15 MED ORDER — PREDNISONE 5 MG PO TABS
17.5000 mg | ORAL_TABLET | Freq: Every day | ORAL | Status: DC
  Administered 2020-10-16 – 2020-10-21 (×6): 17.5 mg via ORAL
  Filled 2020-10-15 (×6): qty 1

## 2020-10-15 MED ORDER — SULFAMETHOXAZOLE-TRIMETHOPRIM 800-160 MG PO TABS
1.0000 | ORAL_TABLET | ORAL | Status: DC
  Administered 2020-10-18 – 2020-10-20 (×2): 1 via ORAL
  Filled 2020-10-15 (×2): qty 1

## 2020-10-15 MED ORDER — METOPROLOL TARTRATE 25 MG PO TABS
25.0000 mg | ORAL_TABLET | Freq: Two times a day (BID) | ORAL | Status: DC
  Administered 2020-10-16 (×2): 25 mg via ORAL
  Filled 2020-10-15 (×2): qty 1

## 2020-10-15 MED ORDER — TRAZODONE HCL 50 MG PO TABS: 150.0000 mg | ORAL_TABLET | Freq: Every day | ORAL | Status: DC

## 2020-10-15 MED ORDER — ACETAMINOPHEN 650 MG RE SUPP: 650.0000 mg | Freq: Four times a day (QID) | RECTAL | Status: DC | PRN

## 2020-10-15 MED ORDER — VALGANCICLOVIR HCL 450 MG PO TABS
450.0000 mg | ORAL_TABLET | ORAL | Status: DC
  Administered 2020-10-18: 450 mg via ORAL
  Filled 2020-10-15 (×2): qty 1

## 2020-10-15 MED ORDER — SENNOSIDES-DOCUSATE SODIUM 8.6-50 MG PO TABS: 2.0000 | ORAL_TABLET | Freq: Two times a day (BID) | ORAL | Status: DC | PRN

## 2020-10-15 NOTE — ED Notes (Signed)
Son at bedside.

## 2020-10-15 NOTE — ED Triage Notes (Signed)
Pt from home via EMS with a c/c of a witnessed syncopal episode. Pt was recently discharged yesterday for a feeding tube complication.Per pt dialysis days are sat, tue, thurs. Liver transplant November last year

## 2020-10-15 NOTE — ED Notes (Signed)
Called [facility] to send supply for feeding tube

## 2020-10-15 NOTE — ED Notes (Signed)
Called pharmacy to review pt meds

## 2020-10-15 NOTE — ED Provider Notes (Signed)
[facility] Emergency Department Provider Note   ____________________________________________   Event Date/Time   First MD Initiated Contact with Patient 10/15/20 2011     (approximate)  I have reviewed the triage vital signs and the nursing notes.   HISTORY  Chief Complaint Loss of Consciousness    HPI Alex Hampton is a 65 y.o. male with past medical history of hypertension, hyperlipidemia, CAD 2s, cirrhosis status post liver transplant, ESRD on HD (TTS), and PEG tube dependence who presents to the ED complaining of syncope.  Per EMS, patient had a witnessed syncopal event by his caregiver at home, did not fall or hit his head.  When EMS arrived, patient was awake and alert and they attempted to help him to the stretcher when he passed out for a second time.  Patient states he does not remember passing out, denies any prodromal symptoms such as lightheadedness.  He denies any associated chest pain or shortness of breath, states he currently feels nauseous but denies any other symptoms.  He has vomited twice today and endorses diarrhea, states he has had minimal p.o. intake.  He typically does tube feeds overnight and supplements with oral intake during the day, states he has not had any difficulty with his tube feeds.  He reports last undergoing dialysis while in the hospital 2 days ago.        Past Medical History:  Diagnosis Date   Depression    History of cardiac cath    History of heart artery stent    Hyperlipidemia    Hypertension    MI (myocardial infarction) ([place])    Renal disorder     Patient Active Problem List   Diagnosis Date Noted   Syncope 10/15/2020   Uremia 10/10/2020   Anemia in ESRD (end-stage renal disease) ([place]) 30/12/2328   Acute metabolic encephalopathy 07/62/2633   Hyperkalemia 10/10/2020   Elevated troponin 10/10/2020   GERD (gastroesophageal reflux disease) 10/10/2020   CAD (coronary artery disease) 10/10/2020   G  tube feedings ([place]) 10/10/2020   Diarrhea 10/10/2020   Type II diabetes mellitus with renal manifestations ([place]) 10/10/2020   Weakness    ACS (acute coronary syndrome) ([place])    Liver transplant recipient [facility])    Anemia of chronic disease    Type 2 diabetes mellitus with diabetic neuropathy, with long-term current use of insulin ([place])    Chest pain 08/19/2020   ESRD (end stage renal disease) ([place]) 08/19/2020   History of anemia due to chronic kidney disease 08/19/2020   Gait abnormality 10/17/2019   History of fall within past 90 days 10/17/2019   Hallucinations 10/17/2019   Cirrhosis of liver with ascites ([place]) on CT 10/17/2019   HTN (hypertension) 10/17/2019   History of MI (myocardial infarction) 10/17/2019   OSA (obstructive sleep apnea) 10/17/2019   Nondisplaced comminuted supracondylar fracture without intercondylar fracture of left humerus, subsequent encounter for fracture with nonunion 10/17/2019   Anxiety and depression 10/17/2019   Confusion and disorientation 10/17/2019    Past Surgical History:  Procedure Laterality Date   IR [facility] GASTRO/COLONIC TUBE PERCUT W/FLUORO  10/14/2020   LEFT HEART CATH AND CORONARY ANGIOGRAPHY N/A 08/19/2020   Procedure: LEFT HEART CATH AND CORONARY ANGIOGRAPHY;  Surgeon: Corey Skains, MD;  Location: [place] [facility];  Service: Cardiovascular;  Laterality: N/A;   LIVER TRANSPLANT      Prior to Admission medications   Medication Sig Start Date End Date Taking? Authorizing Provider  acetaminophen (  TYLENOL) 325 MG tablet Take 650 mg by mouth every 8 (eight) hours as needed for mild pain or moderate pain.   Yes [provider]  aspirin 81 MG EC tablet Take 81 mg by mouth daily.   Yes [provider]  atorvastatin (LIPITOR) 40 MG tablet Take 40 mg by mouth at bedtime.   Yes [provider]  clopidogrel (PLAVIX) 75 MG tablet Take 1 tablet (75 mg total) by mouth daily. 08/20/20  Yes Wieting, Sem, MD   escitalopram (LEXAPRO) 20 MG tablet Take 20 mg by mouth daily. 08/06/20  Yes [provider]  gabapentin (NEURONTIN) 100 MG capsule Take 100 mg by mouth 2 (two) times daily.   Yes [provider]  hydrOXYzine (VISTARIL) 25 MG capsule Take 25 mg by mouth every 6 (six) hours as needed for anxiety or itching.   Yes [provider]  melatonin 3 MG TABS tablet Take 9 mg by mouth at bedtime.   Yes [provider]  metoCLOPramide (REGLAN) 5 MG tablet Take 5 mg by mouth 3 (three) times daily before meals.   Yes [provider]  metoprolol tartrate (LOPRESSOR) 25 MG tablet Take 1 tablet (25 mg total) by mouth 2 (two) times daily. 08/20/20  Yes Wieting, Curties, MD  mirtazapine (REMERON) 15 MG tablet Take 15 mg by mouth at bedtime. 08/06/20  Yes [provider]  mycophenolate (CELLCEPT) 250 MG capsule Take 1,000 mg by mouth 2 (two) times daily.   Yes [provider]  nitroGLYCERIN (NITROSTAT) 0.4 MG SL tablet Place 1 tablet (0.4 mg total) under the tongue every 5 (five) minutes as needed for chest pain. 08/20/20  Yes Wieting, Drago, MD  NOVOLIN R 100 UNIT/ML injection Inject 0-11 Units into the skin See admin instructions. Take 6 units at 6pm plus correction with meal time. Correction scale with meals.  Glucose Range  <200 none, 201 - 250 mg/dL 1 unit, 251 - 300 mg/dL 2 units, 301 - 350 mg/dL 3 units, 351- 400 mg/dl 4 units , >400 mg/dL take 5 units and call your provider   Yes [provider]  pantoprazole (PROTONIX) 40 MG tablet Take 1 tablet (40 mg total) by mouth daily. 10/14/20  Yes Enzo Bi, MD  predniSONE (DELTASONE) 5 MG tablet Take 17.5 mg by mouth daily.   Yes [provider]  senna-docusate (SENOKOT-S) 8.6-50 MG tablet Take 2 tablets by mouth 2 (two) times daily as needed for mild constipation or moderate constipation. 10/14/20  Yes Enzo Bi, MD  sulfamethoxazole-trimethoprim (BACTRIM DS) 800-160 MG tablet Take 1 tablet by  mouth every Monday, Wednesday, and Friday. 08/06/20  Yes [provider]  tacrolimus (PROGRAF) 1 MG capsule Take 7 mg by mouth 2 (two) times daily.   Yes [provider]  traZODone (DESYREL) 150 MG tablet Take 150 mg by mouth at bedtime.   Yes [provider]  valGANciclovir (VALCYTE) 450 MG tablet Take 450 mg by mouth 2 (two) times a week. (Monday and Thursday) with largest meal of the day.   Yes [provider]  Nutritional Supplements (NOVASOURCE RENAL) LIQD Give by tube. Novasource Renal @ 75 ml/hr 8 hours /night (10 pm -8 am)    [provider]    Allergies Patient has no known allergies.  Family History  Problem Relation Age of Onset   Diabetes Mellitus II Mother    Pancreatic cancer Father     Social History Social History   Tobacco Use   Smoking status:  Former    Pack years: 0.00   Smokeless tobacco: Never  Substance Use Topics   Alcohol use: Yes    Comment: occasionally   Drug use: Never    Review of Systems  Constitutional: No fever/chills Eyes: No visual changes. ENT: No sore throat. Cardiovascular: Denies chest pain.  Positive for syncope. Respiratory: Denies shortness of breath. Gastrointestinal: No abdominal pain.  Positive for nausea, vomiting, and diarrhea.  No constipation. Genitourinary: Negative for dysuria. Musculoskeletal: Negative for back pain. Skin: Negative for rash. Neurological: Negative for headaches, focal weakness or numbness.  ____________________________________________   PHYSICAL EXAM:  VITAL SIGNS: ED Triage Vitals [10/15/20 2008]  Enc Vitals Group     BP      Pulse      Resp      Temp      Temp src      SpO2 100 %     Weight      Height      Head Circumference      Peak Flow      Pain Score      Pain Loc      Pain Edu?      Excl. in [place]?     Constitutional: Alert and oriented. Eyes: Conjunctivae are normal. Head: Atraumatic. Nose: No congestion/rhinnorhea. Mouth/Throat:  Mucous membranes are moist. Neck: Normal ROM Cardiovascular: Normal rate, regular rhythm. Grossly normal heart sounds.  Right IJ TDC site clean, dry, and intact.  2+ radial pulses bilaterally. Respiratory: Normal respiratory effort.  No retractions. Lungs CTAB. Gastrointestinal: Soft and nontender. No distention.  PEG tube in place. Genitourinary: deferred Musculoskeletal: No lower extremity tenderness nor edema. Neurologic:  Normal speech and language. No gross focal neurologic deficits are appreciated. Skin:  Skin is warm, dry and intact. No rash noted. Psychiatric: Mood and affect are normal. Speech and behavior are normal.  ____________________________________________   LABS (all labs ordered are listed, but only abnormal results are displayed)  Labs Reviewed  CBC WITH DIFFERENTIAL/PLATELET - Abnormal; Notable for the following components:      Result Value   WBC 2.0 (*)    RBC 2.26 (*)    Hemoglobin 7.8 (*)    HCT 22.5 (*)    MCH 34.5 (*)    Platelets 123 (*)    Neutro Abs 1.1 (*)    Lymphs Abs 0.2 (*)    Abs Immature Granulocytes 0.49 (*)    All other components within normal limits  COMPREHENSIVE METABOLIC PANEL - Abnormal; Notable for the following components:   Sodium 132 (*)    Chloride 92 (*)    Glucose, Bld 222 (*)    BUN 69 (*)    Creatinine, Ser 4.04 (*)    Calcium 8.7 (*)    Total Protein 6.2 (*)    Albumin 3.4 (*)    GFR, Estimated 16 (*)    All other components within normal limits  TROPONIN I (HIGH SENSITIVITY) - Abnormal; Notable for the following components:   Troponin I (High Sensitivity) 37 (*)    All other components within normal limits  TROPONIN I (HIGH SENSITIVITY) - Abnormal; Notable for the following components:   Troponin I (High Sensitivity) 32 (*)    All other components within normal limits  RESP PANEL BY RT-PCR (FLU A&B, COVID) ARPGX2  MAGNESIUM  BASIC METABOLIC PANEL  CBC   ____________________________________________  EKG  ED  ECG REPORT I, Blake Divine, the attending physician, personally viewed and interpreted this ECG.   Date:  10/15/2020  EKG Time: 20:17  Rate: 67  Rhythm: normal sinus rhythm  Axis: Normal  Intervals:none  ST&T Change: None   PROCEDURES  Procedure(s) performed (including Critical Care):  Procedures   ____________________________________________   INITIAL IMPRESSION / ASSESSMENT AND PLAN / ED COURSE      65 year old male with past medical history of hypertension, hyperlipidemia, CAD, cirrhosis status post liver transplant, diabetes, ESRD on HD (TTS), and PEG tube dependence who presents to the ED following multiple syncopal episodes at home today without prodromal symptoms.  Patient denies any associated chest pain or shortness of breath, EKG shows no evidence of arrhythmia or ischemia.  He is likely somewhat dehydrated given his poor p.o. intake recently, we will cautiously hydrate with IV fluids given his ESRD, although he continues to make urine.  Labs are pending and we will observe patient on the cardiac monitor, anticipate admission for further syncopal work-up.  Labs are unremarkable, case discussed with hospitalist for admission.      ____________________________________________   FINAL CLINICAL IMPRESSION(S) / ED DIAGNOSES  Final diagnoses:  Syncope and collapse     ED Discharge Orders     None        Note:  This document was prepared using Dragon voice recognition software and may include unintentional dictation errors.    Blake Divine, MD 10/15/20 [phone]

## 2020-10-15 NOTE — H&P (Addendum)
[place]   PATIENT NAME: Alex Hampton    MR#:  [id number]  DATE OF BIRTH:  [birth year]  DATE OF ADMISSION:  10/15/2020  PRIMARY CARE PHYSICIAN: Juluis Pitch, MD   Patient is coming from: Home  REQUESTING/REFERRING PHYSICIAN: Blake Divine, MD  CHIEF COMPLAINT:   Chief Complaint  Patient presents with   Loss of Consciousness    HISTORY OF PRESENT ILLNESS:  Alex Hampton is a 65 y.o. Caucasian male with medical history significant for  hypertension, hyperlipidemia, GERD, depression with anxiety, CAD, stent placement, anemia, liver cirrhosis with ascites (s/p of liver transplantation in [facility], on immunosuppressant), ESRD, OSA not on CPAP, s/p of G -tube, who presents with 2 syncopal episodes at home earlier today, preceded by lightheadedness and feeling woozy.  There is one of them that was witnessed by EMS when they attempted to help him to the stretcher.  No chest pain or palpitations or paresthesias or focal muscle weakness.  He was having nausea in the ER without vomiting.  He vomited today twice however.  No bilious vomitus or hematemesis.  He admitted to diarrhea.  He has not been having much p.o. intake with diminished appetite.  Since he went home per his son who was with him in the ER, he has been feeling weak and not keeping food down.  He takes tube feeds overnight and supplements with oral intake during the day.  He gets hemodialysis on Tuesday Thursday and Saturday and has been compliant.    He was recently admitted here from 7/3 till yesterday for uremia and being off dialysis for 2 weeks ED Course: When he came to the ER, blood pressure was 100/82 with otherwise normal vital signs.  Labs revealed mild hyponatremia and hypochloremia, hyperglycemia and a BN of 69 with creatinine of 4.04.  Albumin was 3.4 with total protein of 6.2.  High sensitive troponin was 37.  CBC showed anemia with hemoglobin 7.8 and hematocrit 22.5 better than yesterday. EKG as reviewed by me :  EKG showed normal sinus rhythm with a rate of 67 with left atrial enlargement, Q waves inferiorly and early repolarization. Imaging: He abdomen 1 view x-ray that showed his gastrostomy to the left upper quadrant with no abnormal bowel dilatation yesterday. Portable chest x-ray tonight showed low lung volumes with basilar atelectasis and bandlike opacities in the left midlung that may reflect further atelectasis or scarring.  The patient was given 500 mill IV normal saline bolus and 4 mg of IV Zofran.  He will be admitted to an observation medical monitored bed for further evaluation and management. PAST MEDICAL HISTORY:   Past Medical History:  Diagnosis Date   Depression    History of cardiac cath    History of heart artery stent    Hyperlipidemia    Hypertension    MI (myocardial infarction) ([place])    Renal disorder     PAST SURGICAL HISTORY:   Past Surgical History:  Procedure Laterality Date   IR [place] GASTRO/COLONIC TUBE PERCUT W/FLUORO  10/14/2020   LEFT HEART CATH AND CORONARY ANGIOGRAPHY N/A 08/19/2020   Procedure: LEFT HEART CATH AND CORONARY ANGIOGRAPHY;  Surgeon: Corey Skains, MD;  Location: [facility];  Service: Cardiovascular;  Laterality: N/A;   LIVER TRANSPLANT      SOCIAL HISTORY:   Social History   Tobacco Use   Smoking status: Former    Pack years: 0.00   Smokeless tobacco: Never  Substance Use Topics  Alcohol use: Yes    Comment: occasionally    FAMILY HISTORY:   Family History  Problem Relation Age of Onset   Diabetes Mellitus II Mother    Pancreatic cancer Father     DRUG ALLERGIES:  No Known Allergies  REVIEW OF SYSTEMS:   ROS As per history of present illness. All pertinent systems were reviewed above. Constitutional, HEENT, cardiovascular, respiratory, GI, GU, musculoskeletal, neuro, psychiatric, endocrine, integumentary and hematologic systems were reviewed and are otherwise negative/unremarkable except for positive findings  mentioned above in the HPI.   MEDICATIONS AT HOME:   Prior to Admission medications   Medication Sig Start Date End Date Taking? Authorizing Provider  acetaminophen (TYLENOL) 325 MG tablet Take 650 mg by mouth every 8 (eight) hours as needed for mild pain or moderate pain.    [provider]  aspirin 81 MG EC tablet Take 81 mg by mouth daily.    [provider]  atorvastatin (LIPITOR) 40 MG tablet Take 40 mg by mouth at bedtime.    [provider]  clopidogrel (PLAVIX) 75 MG tablet Take 1 tablet (75 mg total) by mouth daily. 08/20/20   Loletha Grayer, MD  escitalopram (LEXAPRO) 20 MG tablet Take 20 mg by mouth daily. 08/06/20   [provider]  gabapentin (NEURONTIN) 100 MG capsule Take 100 mg by mouth 2 (two) times daily.    [provider]  hydrOXYzine (VISTARIL) 25 MG capsule Take 25 mg by mouth every 6 (six) hours as needed for anxiety or itching.    [provider]  melatonin 3 MG TABS tablet Take 9 mg by mouth at bedtime.    [provider]  metoCLOPramide (REGLAN) 5 MG tablet Take 5 mg by mouth 3 (three) times daily before meals.    [provider]  metoprolol tartrate (LOPRESSOR) 25 MG tablet Take 1 tablet (25 mg total) by mouth 2 (two) times daily. 08/20/20   Loletha Grayer, MD  mirtazapine (REMERON) 15 MG tablet Take 15 mg by mouth at bedtime. 08/06/20   [provider]  mycophenolate (CELLCEPT) 250 MG capsule Take 1,000 mg by mouth 2 (two) times daily.    [provider]  nitroGLYCERIN (NITROSTAT) 0.4 MG SL tablet Place 1 tablet (0.4 mg total) under the tongue every 5 (five) minutes as needed for chest pain. 08/20/20   Loletha Grayer, MD  NOVOLIN R 100 UNIT/ML injection Inject 0-11 Units into the skin See admin instructions. Take 6 units at 6pm plus correction with meal time. Correction scale with meals.  Glucose Range  <200 none, 201 - 250 mg/dL 1 unit, 251 - 300 mg/dL 2 units, 301 - 350 mg/dL 3  units, 351- 400 mg/dl 4 units , >400 mg/dL take 5 units and call your provider    [provider]  Nutritional Supplements (NOVASOURCE RENAL) LIQD Give by tube. Novasource Renal @ 75 ml/hr 8 hours /night (10 pm -8 am)    [provider]  pantoprazole (PROTONIX) 40 MG tablet Take 1 tablet (40 mg total) by mouth daily. 10/14/20   Enzo Bi, MD  predniSONE (DELTASONE) 5 MG tablet Take 17.5 mg by mouth daily.    [provider]  senna-docusate (SENOKOT-S) 8.6-50 MG tablet Take 2 tablets by mouth 2 (two) times daily as needed for mild constipation or moderate constipation. 10/14/20   Enzo Bi, MD  sulfamethoxazole-trimethoprim (BACTRIM DS) 800-160 MG tablet Take 1 tablet by mouth every Monday, Wednesday, and Friday. 08/06/20   [provider]  tacrolimus (PROGRAF) 1 MG capsule Take 7 mg by mouth 2 (two) times daily.    [provider]  traZODone (DESYREL) 150 MG tablet Take 150 mg by mouth at bedtime.    [provider]  valGANciclovir (VALCYTE) 450 MG tablet Take 450 mg by mouth 2 (two) times a week. (Monday and Thursday) with largest meal of the day.    [provider]      VITAL SIGNS:  Blood pressure 100/82, pulse 63, temperature 98 F (36.7 C), temperature source Oral, resp. rate 14, height 6\' 1"  (1.854 m), weight 68.8 kg, SpO2 100 %.  PHYSICAL EXAMINATION:  Physical Exam  GENERAL:  65 y.o.-year-old Caucasian male male patient lying in the bed with no acute distress.  EYES: Pupils equal, round, reactive to light and accommodation. No scleral icterus. Extraocular muscles intact.  HEENT: Head atraumatic, normocephalic. Oropharynx and nasopharynx clear.  NECK:  Supple, no jugular venous distention. No thyroid enlargement, no tenderness.  LUNGS: Normal breath sounds bilaterally, no wheezing, rales,rhonchi or crepitation. No use of accessory muscles of respiration.  CARDIOVASCULAR: Regular rate and rhythm, S1, S2 normal. No murmurs, rubs,  or gallops.  ABDOMEN: Soft, nondistended, nontender. Bowel sounds present. No organomegaly or mass.  EXTREMITIES: No pedal edema, cyanosis, or clubbing.  NEUROLOGIC: Cranial nerves II through XII are intact. Muscle strength 5/5 in all extremities. Sensation intact. Gait not checked.  PSYCHIATRIC: The patient is alert and oriented x 3.  Normal affect and good eye contact. SKIN: No obvious rash, lesion, or ulcer.  He has a right chest wall Port-A-Cath that is intact.  LABORATORY PANEL:   CBC Recent Labs  Lab 10/15/20 2024  WBC 2.0*  HGB 7.8*  HCT 22.5*  PLT 123*   ------------------------------------------------------------------------------------------------------------------  Chemistries  Recent Labs  Lab 10/15/20 2024  NA 132*  K 4.1  CL 92*  CO2 27  GLUCOSE 222*  BUN 69*  CREATININE 4.04*  CALCIUM 8.7*  MG 1.9  AST 22  ALT 16  ALKPHOS 60  BILITOT 0.6   ------------------------------------------------------------------------------------------------------------------  Cardiac Enzymes No results for input(s): TROPONINI in the last 168 hours. ------------------------------------------------------------------------------------------------------------------  RADIOLOGY:  DG Abd 1 View  Result Date: 10/14/2020 CLINICAL DATA:  Dislodgement of gastrojejunostomy tube. EXAM: ABDOMEN - 1 VIEW COMPARISON:  None. FINDINGS: The bowel gas pattern is normal. Distal tip of gastrostomy tube appears to be in grossly good position in the left upper quadrant. No radio-opaque calculi or other significant radiographic abnormality are seen. IMPRESSION: Gastrostomy tube tip seen in left upper quadrant. No abnormal bowel dilatation is noted. Electronically Signed   By: Marijo Conception M.D.   On: 10/14/2020 13:59   IR [place] Gastro/Colonic Tube Percut W/Fluoro  Result Date: 10/14/2020 INDICATION: History of liver transplantation and placement of percutaneous gastrojejunal feeding tube due to  lack of adequate oral intake. An indwelling 75 French balloon retention gastrojejunal tube has become completely occluded and cannot be successfully de-clogged. EXAM: REPLACEMENT OF GASTROJEJUNAL FEEDING TUBE UNDER FLUOROSCOPY MEDICATIONS: None ANESTHESIA/SEDATION: None CONTRAST:  10 mL Visipaque 320 FLUOROSCOPY TIME:  Fluoroscopy Time: 1 minute and 6 seconds. 7.7 mGy. COMPLICATIONS: None immediate. PROCEDURE: Informed written consent was obtained from the patient after a thorough discussion of the procedural risks, benefits and alternatives. All questions were addressed. Maximal Sterile Barrier Technique was utilized including caps, mask, sterile gowns, sterile gloves, sterile drape, hand hygiene and skin antiseptic. A timeout was performed prior to the initiation of the procedure. The pre-existing 46 French gastrojejunal tube was  retracted after deflation of the retention balloon. The tube was cut and distal segment removed over a guidewire. Guidewire access was advanced under fluoroscopy utilizing a 5 French catheter through the stomach and duodenum and into the jejunum. A new 2 French balloon retention gastrojejunal catheter was then advanced over the wire under fluoroscopy. Catheter position was confirmed by fluoroscopy after contrast injection into the jejunal lumen. Fluoroscopic images were saved. The retention balloon in the stomach was inflated with 10 mL of saline. FINDINGS: After replacement, the new gastrojejunal catheter extends into the proximal jejunum. The catheter is ready for immediate use. IMPRESSION: Replacement of occluded 18 French balloon retention gastrojejunal tube for new 18 French catheter. The new catheter was advanced into the proximal jejunum. Electronically Signed   By: Aletta Edouard M.D.   On: 10/14/2020 16:34      IMPRESSION AND PLAN:  Active Problems:   Syncope  1.  Syncope. -The patient will be admitted to an observation medically monitored bed. - Will check  orthostatics q 12 hours. - Will obtain a bilateral carotid Doppler and 2D echo. - The patient was gently hydrated with 500 mL IV normal saline bolus. Differential diagnosis would include neurally mediated syncope, cardiogenic, arrhythmias related,  orthostatic hypotension and less likely hypoglycemia.  2.  End-stage renal disease on hemodialysis.  He has pancytopenia. - Nephrology consult to be obtained. - I notified Dr. Holley Raring about the patient. - We will follow CBC.  3.  Dyslipidemia. - We will continue statin therapy.  4.  Peripheral neuropathy. - Continue Neurontin.  5. Essential hypertension. - We will continue Lopressor.  6.  Depression. - We will continue Lexapro and Remeron.  7.  Type 2 diabetes mellitus. - The patient will be placed on supplement coverage with NovoLog.  8.  Status post liver transplant. - We will continue his Prograf.  9.  GERD. - We will continue PPI therapy.  10.  Coronary artery disease. - He will be continued on as needed sublingual nitroglycerin as well as aspirin, Plavix and statin therapy and beta-blocker therapy.  DVT prophylaxis: Subcutaneous heparin.   Code Status: full code. Family Communication:  The plan of care was discussed in details with the patient (and family). I answered all questions. The patient agreed to proceed with the above mentioned plan. Further management will depend upon hospital course. Disposition Plan: Back to previous home environment Consults called: History.  Nephro All the records are reviewed and case discussed with ED provider.  Status is: Observation  The patient remains OBS appropriate and will d/c before 2 midnights.  Dispo: The patient is from: Home              Anticipated d/c is to: Home              Patient currently is not medically stable to d/c.   Difficult to place patient No  TOTAL TIME TAKING CARE OF THIS PATIENT: 55 minutes.    Christel Mormon M.D on 10/15/2020 at 9:48 PM  [facility]   From 7 PM-7 AM, contact night-coverage [email or web address]  CC: Primary care physician; Juluis Pitch, MD

## 2020-10-15 NOTE — ED Notes (Signed)
Called [employer] [facility] regarding Feeding tube pump /  Medications still being verified by pharmacy

## 2020-10-15 NOTE — ED Notes (Signed)
Provider at bedside

## 2020-10-15 NOTE — ED Notes (Signed)
Medication still being verified by pharmacy

## 2020-10-16 ENCOUNTER — Encounter: Payer: Self-pay | Admitting: Family Medicine

## 2020-10-16 ENCOUNTER — Observation Stay: Payer: Medicare Other

## 2020-10-16 ENCOUNTER — Observation Stay
Admit: 2020-10-16 | Discharge: 2020-10-16 | Disposition: A | Discharge status: Home / Self Care | Payer: Medicare Other | Attending: Family Medicine | Admitting: Family Medicine

## 2020-10-16 DIAGNOSIS — D649 Anemia, unspecified: Secondary | ICD-10-CM | POA: Diagnosis not present

## 2020-10-16 DIAGNOSIS — K7460 Unspecified cirrhosis of liver: Secondary | ICD-10-CM | POA: Diagnosis present

## 2020-10-16 DIAGNOSIS — D61818 Other pancytopenia: Secondary | ICD-10-CM | POA: Diagnosis present

## 2020-10-16 DIAGNOSIS — G9341 Metabolic encephalopathy: Secondary | ICD-10-CM | POA: Diagnosis present

## 2020-10-16 DIAGNOSIS — N2581 Secondary hyperparathyroidism of renal origin: Secondary | ICD-10-CM | POA: Diagnosis present

## 2020-10-16 DIAGNOSIS — D84821 Immunodeficiency due to drugs: Secondary | ICD-10-CM | POA: Diagnosis present

## 2020-10-16 DIAGNOSIS — R64 Cachexia: Secondary | ICD-10-CM | POA: Diagnosis present

## 2020-10-16 DIAGNOSIS — E1122 Type 2 diabetes mellitus with diabetic chronic kidney disease: Secondary | ICD-10-CM | POA: Diagnosis present

## 2020-10-16 DIAGNOSIS — F32A Depression, unspecified: Secondary | ICD-10-CM | POA: Diagnosis present

## 2020-10-16 DIAGNOSIS — K9423 Gastrostomy malfunction: Secondary | ICD-10-CM | POA: Diagnosis not present

## 2020-10-16 DIAGNOSIS — I2510 Atherosclerotic heart disease of native coronary artery without angina pectoris: Secondary | ICD-10-CM | POA: Diagnosis present

## 2020-10-16 DIAGNOSIS — I132 Hypertensive heart and chronic kidney disease with heart failure and with stage 5 chronic kidney disease, or end stage renal disease: Secondary | ICD-10-CM | POA: Diagnosis present

## 2020-10-16 DIAGNOSIS — D638 Anemia in other chronic diseases classified elsewhere: Secondary | ICD-10-CM | POA: Diagnosis not present

## 2020-10-16 DIAGNOSIS — E1140 Type 2 diabetes mellitus with diabetic neuropathy, unspecified: Secondary | ICD-10-CM | POA: Diagnosis present

## 2020-10-16 DIAGNOSIS — Z20822 Contact with and (suspected) exposure to covid-19: Secondary | ICD-10-CM | POA: Diagnosis present

## 2020-10-16 DIAGNOSIS — E871 Hypo-osmolality and hyponatremia: Secondary | ICD-10-CM | POA: Diagnosis present

## 2020-10-16 DIAGNOSIS — I5022 Chronic systolic (congestive) heart failure: Secondary | ICD-10-CM | POA: Diagnosis present

## 2020-10-16 DIAGNOSIS — I959 Hypotension, unspecified: Secondary | ICD-10-CM | POA: Diagnosis not present

## 2020-10-16 DIAGNOSIS — D72819 Decreased white blood cell count, unspecified: Secondary | ICD-10-CM | POA: Diagnosis not present

## 2020-10-16 DIAGNOSIS — I6523 Occlusion and stenosis of bilateral carotid arteries: Secondary | ICD-10-CM | POA: Diagnosis present

## 2020-10-16 DIAGNOSIS — E1165 Type 2 diabetes mellitus with hyperglycemia: Secondary | ICD-10-CM | POA: Diagnosis present

## 2020-10-16 DIAGNOSIS — R55 Syncope and collapse: Secondary | ICD-10-CM | POA: Diagnosis present

## 2020-10-16 DIAGNOSIS — T8649 Other complications of liver transplant: Secondary | ICD-10-CM | POA: Diagnosis present

## 2020-10-16 DIAGNOSIS — E43 Unspecified severe protein-calorie malnutrition: Secondary | ICD-10-CM | POA: Diagnosis present

## 2020-10-16 DIAGNOSIS — Z992 Dependence on renal dialysis: Secondary | ICD-10-CM | POA: Diagnosis not present

## 2020-10-16 DIAGNOSIS — N186 End stage renal disease: Secondary | ICD-10-CM | POA: Diagnosis present

## 2020-10-16 DIAGNOSIS — Y830 Surgical operation with transplant of whole organ as the cause of abnormal reaction of the patient, or of later complication, without mention of misadventure at the time of the procedure: Secondary | ICD-10-CM | POA: Diagnosis present

## 2020-10-16 DIAGNOSIS — D631 Anemia in chronic kidney disease: Secondary | ICD-10-CM | POA: Diagnosis present

## 2020-10-16 LAB — CBC
HCT: 19 % — ABNORMAL LOW (ref 39.0–52.0)
Hemoglobin: 6.6 g/dL — ABNORMAL LOW (ref 13.0–17.0)
MCH: 33.7 pg (ref 26.0–34.0)
MCHC: 34.7 g/dL (ref 30.0–36.0)
MCV: 96.9 fL (ref 80.0–100.0)
Platelets: 116 10*3/uL — ABNORMAL LOW (ref 150–400)
RBC: 1.96 MIL/uL — ABNORMAL LOW (ref 4.22–5.81)
RDW: 13.4 % (ref 11.5–15.5)
WBC: 2.5 10*3/uL — ABNORMAL LOW (ref 4.0–10.5)
nRBC: 0 % (ref 0.0–0.2)

## 2020-10-16 LAB — GLUCOSE, CAPILLARY
Glucose-Capillary: 170 mg/dL — ABNORMAL HIGH (ref 70–99)
Glucose-Capillary: 167 mg/dL — ABNORMAL HIGH (ref 70–99)
Glucose-Capillary: 153 mg/dL — ABNORMAL HIGH (ref 70–99)
Glucose-Capillary: 169 mg/dL — ABNORMAL HIGH (ref 70–99)

## 2020-10-16 LAB — ABO/RH: ABO/RH(D): O NEG

## 2020-10-16 LAB — BASIC METABOLIC PANEL
Anion gap: 8 (ref 5–15)
Anion gap: 9 (ref 5–15)
BUN: 69 mg/dL — ABNORMAL HIGH (ref 8–23)
BUN: 70 mg/dL — ABNORMAL HIGH (ref 8–23)
CO2: 29 mmol/L (ref 22–32)
CO2: 28 mmol/L (ref 22–32)
Calcium: 8.6 mg/dL — ABNORMAL LOW (ref 8.9–10.3)
Calcium: 8.7 mg/dL — ABNORMAL LOW (ref 8.9–10.3)
Chloride: 94 mmol/L — ABNORMAL LOW (ref 98–111)
Chloride: 96 mmol/L — ABNORMAL LOW (ref 98–111)
Creatinine, Ser: 3.9 mg/dL — ABNORMAL HIGH (ref 0.61–1.24)
Creatinine, Ser: 4.14 mg/dL — ABNORMAL HIGH (ref 0.61–1.24)
GFR, Estimated: 16 mL/min — ABNORMAL LOW (ref >60)
GFR, Estimated: 15 mL/min — ABNORMAL LOW (ref >60)
Glucose, Bld: 179 mg/dL — ABNORMAL HIGH (ref 70–99)
Glucose, Bld: 175 mg/dL — ABNORMAL HIGH (ref 70–99)
Potassium: 4.1 mmol/L (ref 3.5–5.1)
Potassium: 4.1 mmol/L (ref 3.5–5.1)
Sodium: 131 mmol/L — ABNORMAL LOW (ref 135–145)
Sodium: 133 mmol/L — ABNORMAL LOW (ref 135–145)

## 2020-10-16 LAB — HEMOGLOBIN AND HEMATOCRIT, BLOOD
HCT: 19 % — ABNORMAL LOW (ref 39.0–52.0)
Hemoglobin: 6.6 g/dL — ABNORMAL LOW (ref 13.0–17.0)

## 2020-10-16 LAB — RETICULOCYTES
Immature Retic Fract: 12.7 % (ref 2.3–15.9)
RBC.: 1.83 MIL/uL — ABNORMAL LOW (ref 4.22–5.81)
Retic Count, Absolute: 17.2 10*3/uL — ABNORMAL LOW (ref 19.0–186.0)
Retic Ct Pct: 0.9 % (ref 0.4–3.1)

## 2020-10-16 LAB — PREPARE RBC (CROSSMATCH)

## 2020-10-16 LAB — TACROLIMUS LEVEL: Tacrolimus (FK506) - LabCorp: 8.4 ng/mL (ref 2.0–20.0)

## 2020-10-16 MED ORDER — ONDANSETRON HCL 4 MG/2ML IJ SOLN
4.0000 mg | Freq: Three times a day (TID) | INTRAMUSCULAR | Status: DC
  Administered 2020-10-16 – 2020-10-18 (×4): 4 mg via INTRAVENOUS
  Filled 2020-10-16 (×5): qty 2

## 2020-10-16 MED ORDER — TRAZODONE HCL 50 MG PO TABS
150.0000 mg | ORAL_TABLET | Freq: Every day | ORAL | Status: DC
  Administered 2020-10-16: 150 mg via ORAL
  Filled 2020-10-16: qty 1

## 2020-10-16 MED ORDER — SODIUM CHLORIDE 0.9% IV SOLUTION: Freq: Once | INTRAVENOUS | Status: AC

## 2020-10-16 MED ORDER — NEPRO/CARBSTEADY PO LIQD
1000.0000 mL | ORAL | Status: DC
  Administered 2020-10-16 – 2020-10-17 (×2): 1000 mL

## 2020-10-16 MED ORDER — CHLORHEXIDINE GLUCONATE CLOTH 2 % EX PADS
6.0000 | MEDICATED_PAD | Freq: Every day | CUTANEOUS | Status: DC
  Administered 2020-10-16 – 2020-10-20 (×4): 6 via TOPICAL

## 2020-10-16 MED ORDER — LACTULOSE 10 GM/15ML PO SOLN
20.0000 g | Freq: Every day | ORAL | Status: DC
  Administered 2020-10-16 – 2020-10-17 (×2): 20 g via ORAL
  Filled 2020-10-16 (×2): qty 30

## 2020-10-16 MED ORDER — SODIUM CHLORIDE 0.9 % IV SOLN
6.2500 mg | Freq: Four times a day (QID) | INTRAVENOUS | Status: DC | PRN
  Filled 2020-10-16: qty 0.25

## 2020-10-16 MED ORDER — PANTOPRAZOLE SODIUM 40 MG PO TBEC
40.0000 mg | DELAYED_RELEASE_TABLET | Freq: Two times a day (BID) | ORAL | Status: DC
  Administered 2020-10-16: 40 mg via ORAL
  Filled 2020-10-16: qty 1

## 2020-10-16 MED ORDER — GABAPENTIN 100 MG PO CAPS
100.0000 mg | ORAL_CAPSULE | Freq: Two times a day (BID) | ORAL | Status: DC
  Administered 2020-10-16 (×2): 100 mg via ORAL
  Filled 2020-10-16 (×2): qty 1

## 2020-10-16 MED ORDER — MIRTAZAPINE 15 MG PO TABS
7.5000 mg | ORAL_TABLET | Freq: Every day | ORAL | Status: DC
  Administered 2020-10-16 – 2020-10-17 (×2): 7.5 mg via ORAL
  Filled 2020-10-16 (×2): qty 1

## 2020-10-16 MED ORDER — SODIUM CHLORIDE 0.9 % IV SOLN: INTRAVENOUS | Status: AC

## 2020-10-16 MED ORDER — METOCLOPRAMIDE HCL 5 MG/ML IJ SOLN
5.0000 mg | Freq: Three times a day (TID) | INTRAMUSCULAR | Status: DC
  Administered 2020-10-16 – 2020-10-17 (×2): 5 mg via INTRAVENOUS
  Filled 2020-10-16 (×2): qty 2

## 2020-10-16 MED ORDER — PANTOPRAZOLE SODIUM 40 MG IV SOLR
40.0000 mg | Freq: Two times a day (BID) | INTRAVENOUS | Status: DC
  Administered 2020-10-16 – 2020-10-20 (×9): 40 mg via INTRAVENOUS
  Filled 2020-10-16 (×9): qty 40

## 2020-10-16 MED ORDER — MELATONIN 5 MG PO TABS
10.0000 mg | ORAL_TABLET | Freq: Every day | ORAL | Status: DC
  Administered 2020-10-16: 10 mg via ORAL
  Filled 2020-10-16: qty 2

## 2020-10-16 MED ORDER — EPOETIN ALFA 10000 UNIT/ML IJ SOLN
10000.0000 [IU] | INTRAMUSCULAR | Status: DC
  Administered 2020-10-19 – 2020-10-21 (×2): 10000 [IU] via INTRAVENOUS
  Filled 2020-10-16 (×2): qty 1

## 2020-10-16 MED ORDER — PANTOPRAZOLE SODIUM 40 MG PO TBEC
40.0000 mg | DELAYED_RELEASE_TABLET | Freq: Every day | ORAL | Status: DC
  Administered 2020-10-16: 40 mg via ORAL
  Filled 2020-10-16: qty 1

## 2020-10-16 MED ORDER — MIRTAZAPINE 15 MG PO TABS
15.0000 mg | ORAL_TABLET | Freq: Every day | ORAL | Status: DC
  Administered 2020-10-16: 15 mg via ORAL
  Filled 2020-10-16: qty 1

## 2020-10-16 MED ORDER — HEPARIN SODIUM (PORCINE) 5000 UNIT/ML IJ SOLN
5000.0000 [IU] | Freq: Three times a day (TID) | INTRAMUSCULAR | Status: DC
  Administered 2020-10-16 – 2020-10-21 (×14): 5000 [IU] via SUBCUTANEOUS
  Filled 2020-10-16 (×14): qty 1

## 2020-10-16 NOTE — Progress Notes (Signed)
HD started at 1328, via R CVC. As per Dr Rolly Salter UF turned off at 1400.  Epogen given as ordered.  At 1500 BP 80/57, Dr Rolly Salter at bedside and 500cc NS bolus given as ordered. Hemaglobin 6.6, Dr Rolly Salter aware and 1 unit PRBC to be given, floor nurse aware. HD completed at 1628, report given and patient transported to floor in stable condition.

## 2020-10-16 NOTE — Progress Notes (Signed)
Patient transferred from ED via hospital stretcher to room 160. Oriented patient to room and room equipment. Educated and explained Fall precautions and safety measures will be in place while he is here. Patient complains of no pain or discomfort. Feeding is connected and infusing per order. Call bell at bedside.  Will continue to monitor to end of shift.

## 2020-10-16 NOTE — Progress Notes (Signed)
*  PRELIMINARY RESULTS* Echocardiogram 2D Echocardiogram has been performed.  Claretta Fraise 10/16/2020, 10:16 AM

## 2020-10-16 NOTE — ED Notes (Signed)
Pt given a cup of water 

## 2020-10-16 NOTE — Progress Notes (Addendum)
PROGRESS NOTE    Alex Hampton  YTK:[id number] DOB: 16-Aug-1955 DOA: 10/15/2020 PCP: Juluis Pitch, MD   Chief Complaint  Patient presents with   Loss of Consciousness    Brief Narrative: 65 year old male with multiple medical problems including ESRD on HD TTS, Liver Cirrhosis s/p Liver Transplant on immunosuppressants, Severe Protein Calorie Malnutrition with G tube in place who presents to the ED with recurrent nausea and vomiting x 3 days followed by 2 syncopal episodes at home while walking to the bathroom. Son reports patient had multiple bowel movements prior to passing out.  There was no seizure like activity witnessed by the son.  Alex Hampton cannot recall details of the event.  But he denies having any chest pain or shortness of breath.  Subjective: Alex Hampton looks fatigued and malaised. Son states this is his baseline. Hb is 6.6 g/dL.  I ordered 1 unit of pRBC and patient received Epogen with dialysis today. He endorses intermittent nausea throughout the day.   Assessment & Plan: Active Problems:   Syncope  Nausea and Vomiting: - Schedule IV Zofran 4 mg q6hrs. - Give IV Phenergan PRN - Schedule IV Reglan 5 mg TID.  Monitor renal function. - Start IV Protonix 40 mg BID. - Give gentle fluids LR 50 CC/hr x 10 hours.  Acute Anemia: There is no evidence of any acute bleeding. 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 - Give 1 unit pRBC. - Repeat HH in am. - Hold Aspirin and Plavix.  Acute Syncope: This sounds vasovagal based on story.  There was no evidence of any seizures.  Given patient's risk factors, I will perform preliminary workup to rule out CV or CNS causes. - Check Echo. - Check MRI of brain. - Monitor closely.  ESRD on HD TTS: - Volume status is euvolemic. - Nephrology is following, appreciate.  Acute Metabolic Encephalopathy - uremia vs other: - Hold all sedating meds - Gabapentin, Lexapro, Remeron. - Start prophylactic Lactulose 20 mg daily.  Titrate to 2-3 bowel  movements daily and improvement in mental status.  Hypotension: - Give gentle fluids and monitor.  Diabetes Mellitus Type 2: - Sliding Scale plus POC glucose q6hrs.  Liver Transplant: - FU Hepatology outpatient.  CAD: - Hold DAPT due to acute anemia.    Diet Order             Diet Heart Room service appropriate? Yes; Fluid consistency: Thin  Diet effective now                         Patient's Body mass index is 20.27 kg/m.    DVT prophylaxis: heparin injection 5,000 Units Start: 10/16/20 0145 Code Status:   Code Status: Full Code  Family Communication: plan of care discussed with patient at bedside.  Status is: Inpatient  Remains inpatient appropriate because:Inpatient level of care appropriate due to severity of illness  Dispo: The patient is from: Home              Anticipated d/c is to: Home Hopefully Discharge Home in 1-2 days pending syncope workup.              Patient currently is not medically stable to d/c.   Difficult to place patient No     Unresulted Labs (From admission, onward)     Start     Ordered   10/16/20 0743  Occult blood card to lab, stool  Once,   R  10/16/20 0745            Medications reviewed:  Scheduled Meds:  sodium chloride   Intravenous Once   atorvastatin  40 mg Oral QHS   Chlorhexidine Gluconate Cloth  6 each Topical Daily   [START ON 10/19/2020] epoetin (EPOGEN/PROCRIT) injection  10,000 Units Intravenous Q T,Th,Sa-HD   feeding supplement (NEPRO CARB STEADY)  1,000 mL Per Tube Q24H   heparin  5,000 Units Subcutaneous Q8H   lactulose  20 g Oral Daily   metoCLOPramide (REGLAN) injection  5 mg Intravenous Q8H   mirtazapine  7.5 mg Oral QHS   mycophenolate  1,000 mg Oral BID   ondansetron (ZOFRAN) IV  4 mg Intravenous Q8H   pantoprazole (PROTONIX) IV  40 mg Intravenous Q12H   predniSONE  17.5 mg Oral Daily   [START ON 10/18/2020] sulfamethoxazole-trimethoprim  1 tablet Oral Q M,W,F   tacrolimus  7 mg Oral  BID   [START ON 10/18/2020] valGANciclovir  450 mg Oral Once per day on Mon Thu   Continuous Infusions:  promethazine (PHENERGAN) injection (IM or IVPB)      Consultants:see note  Procedures:see note  Antimicrobials: Anti-infectives (From admission, onward)    Start     Dose/Rate Route Frequency Ordered Stop   10/18/20 1000  sulfamethoxazole-trimethoprim (BACTRIM DS) 800-160 MG per tablet 1 tablet        1 tablet Oral Every M-W-F 10/15/20 2147     10/18/20 0900  valGANciclovir (VALCYTE) 450 MG tablet TABS 450 mg       Note to Pharmacy: (Monday and Thursday) with largest meal of the day.     450 mg Oral Once per day on Mon Thu 10/15/20 2147        Culture/Microbiology No results found for: SDES, SPECREQUEST, CULT, REPTSTATUS  Other culture-see note  Objective: Vitals: Today's Vitals   10/16/20 1600 10/16/20 1615 10/16/20 1630 10/16/20 1645  BP: 118/63 97/71 104/66 103/67  Pulse: (!) 59 (!) 59 (!) 58 (!) 59  Resp: (!) 22 14 18 17   Temp:      TempSrc:      SpO2: 99% 99% 98% 100%  Weight:      Height:      PainSc:        Intake/Output Summary (Last 24 hours) at 10/16/2020 1846 Last data filed at 10/16/2020 1654 Gross per 24 hour  Intake 1685.83 ml  Output -755 ml  Net 2440.83 ml   Filed Weights   10/15/20 2022 10/16/20 0500  Weight: 68.8 kg 69.7 kg   Weight change:   Intake/Output from previous day: 07/08 0701 - 07/09 0700 In: 1113.3 [P.O.:480; NG/GT:133.3; IV Piggyback:500] Out: -  Intake/Output this shift: Total I/O In: 572.5 [NG/GT:572.5] Out: -755  Filed Weights   10/15/20 2022 10/16/20 0500  Weight: 68.8 kg 69.7 kg    Examination: General exam: chronically ill appearing, occasionally drowsy, RIJ permcath HEENT: NCAT, PERRL, no icterus Respiratory system: CTAB no WRR Cardiovascular system: Did not appreciate a murmur, regular, No JVD. Gastrointestinal system: G tube in place, Abdomen soft, NT,ND, BS+. Nervous System: 4/5 strength on the left, no  other focal deficits, cranial nerves intact. Extremities: No edema, distal peripheral pulses palpable.  Skin: scattered bruises on abdomen MSK: Physical Deconditioning  Data Reviewed: I have personally reviewed following labs and imaging studies CBC: Recent Labs  Lab 10/12/20 0447 10/13/20 0424 10/14/20 0444 10/15/20 2024 10/16/20 0539 10/16/20 1009  WBC 2.7* 2.7* 2.1* 2.0* 2.5*  --   NEUTROABS  1.8 1.4*  --  1.1*  --   --   HGB 8.5* 7.7* 7.1* 7.8* 6.6* 6.6*  HCT 24.4* 21.9* 20.2* 22.5* 19.0* 19.0*  MCV 100.4* 97.3 99.0 99.6 96.9  --   PLT 161 142* 117* 123* 116*  --    Basic Metabolic Panel: Recent Labs  Lab 10/11/20 1447 10/12/20 0447 10/13/20 0424 10/14/20 0444 10/15/20 2024 10/16/20 0539 10/16/20 1009  NA  --  131* 132* 134* 132* 131* 133*  K  --  5.9* 4.3 4.0 4.1 4.1 4.1  CL  --  97* 95* 95* 92* 94* 96*  CO2  --  26 30 29 27 29 28   GLUCOSE  --  222* 123* 117* 222* 179* 175*  BUN  --  75* 52* 38* 69* 69* 70*  CREATININE  --  2.55* 2.64* 2.66* 4.04* 3.90* 4.14*  CALCIUM  --  9.1 8.9 8.7* 8.7* 8.6* 8.7*  MG  --  1.8  --  1.6* 1.9  --   --   PHOS 5.4* 4.1  --   --   --   --   --    GFR: Estimated Creatinine Clearance: 17.3 mL/min (A) (by C-G formula based on SCr of 4.14 mg/dL (H)). Liver Function Tests: Recent Labs  Lab 10/10/20 1016 10/15/20 2024  AST 18 22  ALT 13 16  ALKPHOS 92 60  BILITOT 0.5 0.6  PROT 7.4 6.2*  ALBUMIN 4.0 3.4*    Recent Labs  Lab 10/10/20 1500  AMMONIA 9   Coagulation Profile: Recent Labs  Lab 10/10/20 1500  INR 1.1    CBG: Recent Labs  Lab 10/14/20 0738 10/14/20 1141 10/16/20 0528 10/16/20 0811 10/16/20 1200  GLUCAP 106* 107* 170* 167* 153*    Anemia Panel: Recent Labs    10/16/20 0812  RETICCTPCT 0.9    Recent Results (from the past 240 hour(s))  Resp Panel by RT-PCR (Flu A&B, Covid) Nasopharyngeal Swab     Status: None   Collection Time: 10/10/20  2:45 PM   Specimen: Nasopharyngeal Swab;  Nasopharyngeal(NP) swabs in vial transport medium  Result Value Ref Range Status   SARS Coronavirus 2 by RT PCR NEGATIVE NEGATIVE Final    Comment: (NOTE) SARS-CoV-2 target nucleic acids are NOT DETECTED.  The SARS-CoV-2 RNA is generally detectable in upper respiratory specimens during the acute phase of infection. The lowest concentration of SARS-CoV-2 viral copies this assay can detect is 138 copies/mL. A negative result does not preclude SARS-Cov-2 infection and should not be used as the sole basis for treatment or other patient management decisions. A negative result may occur with  improper specimen collection/handling, submission of specimen other than nasopharyngeal swab, presence of viral mutation(s) within the areas targeted by this assay, and inadequate number of viral copies(<138 copies/mL). A negative result must be combined with clinical observations, patient history, and epidemiological information. The expected result is Negative.  Fact Sheet for Patients:  EntrepreneurPulse.com.au  Fact Sheet for Healthcare Providers:  IncredibleEmployment.be  This test is no t yet approved or cleared by the [place] FDA and  has been authorized for detection and/or diagnosis of SARS-CoV-2 by FDA under an Emergency Use Authorization (EUA). This EUA will remain  in effect (meaning this test can be used) for the duration of the COVID-19 declaration under Section 564(b)(1) of the Act, 21 U.S.C.section 360bbb-3(b)(1), unless the authorization is terminated  or revoked sooner.       Influenza A by PCR NEGATIVE NEGATIVE Final  Influenza B by PCR NEGATIVE NEGATIVE Final    Comment: (NOTE) The Xpert Xpress SARS-CoV-2/FLU/RSV plus assay is intended as an aid in the diagnosis of influenza from Nasopharyngeal swab specimens and should not be used as a sole basis for treatment. Nasal washings and aspirates are unacceptable for Xpert Xpress  SARS-CoV-2/FLU/RSV testing.  Fact Sheet for Patients: EntrepreneurPulse.com.au  Fact Sheet for Healthcare Providers: IncredibleEmployment.be  This test is not yet approved or cleared by the [place] FDA and has been authorized for detection and/or diagnosis of SARS-CoV-2 by FDA under an Emergency Use Authorization (EUA). This EUA will remain in effect (meaning this test can be used) for the duration of the COVID-19 declaration under Section 564(b)(1) of the Act, 21 U.S.C. section 360bbb-3(b)(1), unless the authorization is terminated or revoked.  Performed at [facility], [address] [place], [place] [address]   Resp Panel by RT-PCR (Flu A&B, Covid) Nasopharyngeal Swab     Status: None   Collection Time: 10/15/20  8:24 PM   Specimen: Nasopharyngeal Swab; Nasopharyngeal(NP) swabs in vial transport medium  Result Value Ref Range Status   SARS Coronavirus 2 by RT PCR NEGATIVE NEGATIVE Final    Comment: (NOTE) SARS-CoV-2 target nucleic acids are NOT DETECTED.  The SARS-CoV-2 RNA is generally detectable in upper respiratory specimens during the acute phase of infection. The lowest concentration of SARS-CoV-2 viral copies this assay can detect is 138 copies/mL. A negative result does not preclude SARS-Cov-2 infection and should not be used as the sole basis for treatment or other patient management decisions. A negative result may occur with  improper specimen collection/handling, submission of specimen other than nasopharyngeal swab, presence of viral mutation(s) within the areas targeted by this assay, and inadequate number of viral copies(<138 copies/mL). A negative result must be combined with clinical observations, patient history, and epidemiological information. The expected result is Negative.  Fact Sheet for Patients:  EntrepreneurPulse.com.au  Fact Sheet for Healthcare Providers:   IncredibleEmployment.be  This test is no t yet approved or cleared by the [place] FDA and  has been authorized for detection and/or diagnosis of SARS-CoV-2 by FDA under an Emergency Use Authorization (EUA). This EUA will remain  in effect (meaning this test can be used) for the duration of the COVID-19 declaration under Section 564(b)(1) of the Act, 21 U.S.C.section 360bbb-3(b)(1), unless the authorization is terminated  or revoked sooner.       Influenza A by PCR NEGATIVE NEGATIVE Final   Influenza B by PCR NEGATIVE NEGATIVE Final    Comment: (NOTE) The Xpert Xpress SARS-CoV-2/FLU/RSV plus assay is intended as an aid in the diagnosis of influenza from Nasopharyngeal swab specimens and should not be used as a sole basis for treatment. Nasal washings and aspirates are unacceptable for Xpert Xpress SARS-CoV-2/FLU/RSV testing.  Fact Sheet for Patients: EntrepreneurPulse.com.au  Fact Sheet for Healthcare Providers: IncredibleEmployment.be  This test is not yet approved or cleared by the [place] FDA and has been authorized for detection and/or diagnosis of SARS-CoV-2 by FDA under an Emergency Use Authorization (EUA). This EUA will remain in effect (meaning this test can be used) for the duration of the COVID-19 declaration under Section 564(b)(1) of the Act, 21 U.S.C. section 360bbb-3(b)(1), unless the authorization is terminated or revoked.  Performed at [facility], [address], [place], [place] [address]      Radiology Studies: DG Abd 1 View  Result Date: 10/16/2020 CLINICAL DATA:  Nausea. EXAM: ABDOMEN - 1 VIEW COMPARISON:  Plain film of the abdomen dated 10/14/2020. FINDINGS: No dilated large or small bowel loops are identified. Gastrojejunal feeding tube in place. No evidence of renal or ureteral calculi. No evidence of abnormal fluid collection. No acute appearing osseous abnormality.  IMPRESSION: Nonobstructive bowel gas pattern. Gastrojejunal feeding tube in place. Electronically Signed   By: Franki Cabot M.D.   On: 10/16/2020 12:11   US Carotid Bilateral  Result Date: 10/16/2020 CLINICAL DATA:  65 year old male with syncope. EXAM: BILATERAL CAROTID DUPLEX ULTRASOUND TECHNIQUE: Pearline Cables scale imaging, color Doppler and duplex ultrasound were performed of bilateral carotid and vertebral arteries in the neck. COMPARISON:  None. FINDINGS: Criteria: Quantification of carotid stenosis is based on velocity parameters that correlate the residual internal carotid diameter with NASCET-based stenosis levels, using the diameter of the distal internal carotid lumen as the denominator for stenosis measurement. The following velocity measurements were obtained: RIGHT ICA: 64 cm/sec CCA: 76 cm/sec SYSTOLIC ICA/CCA RATIO:  0.8 ECA: 120 cm/sec LEFT ICA: 79 cm/sec CCA: 85 cm/sec SYSTOLIC ICA/CCA RATIO:  0.9 ECA: 171 cm/sec RIGHT CAROTID ARTERY: There is plaque involving the carotid bulb and proximal ICA. RIGHT VERTEBRAL ARTERY:  Antegrade flow. LEFT CAROTID ARTERY: There is plaque involving the carotid bulb and proximal ICA. LEFT VERTEBRAL ARTERY:  Antegrade flow. IMPRESSION: Less than 50% ICA stenosis bilaterally. Electronically Signed   By: Anner Crete M.D.   On: 10/16/2020 01:09     LOS: 0 days   George Hugh, MD [facility]  10/16/2020, 6:46 PM

## 2020-10-16 NOTE — Plan of Care (Signed)
  Problem: Education: Goal: Knowledge of General Education information will improve Description: Including pain rating scale, medication(s)/side effects and non-pharmacologic comfort measures 10/16/2020 0508 by Vergie Living, RN Outcome: Progressing 10/16/2020 0508 by Jannifer Rodney A, RN Outcome: Progressing   Problem: Health Behavior/Discharge Planning: Goal: Ability to manage health-related needs will improve 10/16/2020 0508 by Jannifer Rodney A, RN Outcome: Progressing 10/16/2020 0508 by Jannifer Rodney A, RN Outcome: Progressing   Problem: Clinical Measurements: Goal: Ability to maintain clinical measurements within normal limits will improve 10/16/2020 0508 by Jannifer Rodney A, RN Outcome: Progressing 10/16/2020 0508 by Jannifer Rodney A, RN Outcome: Progressing Goal: Will remain free from infection 10/16/2020 0508 by Jannifer Rodney A, RN Outcome: Progressing 10/16/2020 0508 by Jannifer Rodney A, RN Outcome: Progressing Goal: Diagnostic test results will improve 10/16/2020 0508 by Jannifer Rodney A, RN Outcome: Progressing 10/16/2020 0508 by Jannifer Rodney A, RN Outcome: Progressing Goal: Respiratory complications will improve 10/16/2020 0508 by Jannifer Rodney A, RN Outcome: Progressing 10/16/2020 0508 by Jannifer Rodney A, RN Outcome: Progressing Goal: Cardiovascular complication will be avoided 10/16/2020 0508 by Jannifer Rodney A, RN Outcome: Progressing 10/16/2020 0508 by Jannifer Rodney A, RN Outcome: Progressing   Problem: Activity: Goal: Risk for activity intolerance will decrease 10/16/2020 0508 by Jannifer Rodney A, RN Outcome: Progressing 10/16/2020 0508 by Jannifer Rodney A, RN Outcome: Progressing   Problem: Nutrition: Goal: Adequate nutrition will be maintained 10/16/2020 0508 by Jannifer Rodney A, RN Outcome: Progressing 10/16/2020 0508 by Jannifer Rodney A, RN Outcome: Progressing   Problem: Coping: Goal: Level of anxiety will decrease 10/16/2020 0508 by Jannifer Rodney A, RN Outcome:  Progressing 10/16/2020 0508 by Jannifer Rodney A, RN Outcome: Progressing   Problem: Elimination: Goal: Will not experience complications related to bowel motility 10/16/2020 0508 by Jannifer Rodney A, RN Outcome: Progressing 10/16/2020 0508 by Jannifer Rodney A, RN Outcome: Progressing Goal: Will not experience complications related to urinary retention 10/16/2020 0508 by Jannifer Rodney A, RN Outcome: Progressing 10/16/2020 0508 by Jannifer Rodney A, RN Outcome: Progressing   Problem: Pain Managment: Goal: General experience of comfort will improve 10/16/2020 0508 by Jannifer Rodney A, RN Outcome: Progressing 10/16/2020 0508 by Jannifer Rodney A, RN Outcome: Progressing   Problem: Safety: Goal: Ability to remain free from injury will improve 10/16/2020 0508 by Jannifer Rodney A, RN Outcome: Progressing 10/16/2020 0508 by Jannifer Rodney A, RN Outcome: Progressing   Problem: Skin Integrity: Goal: Risk for impaired skin integrity will decrease 10/16/2020 0508 by Jannifer Rodney A, RN Outcome: Progressing 10/16/2020 0508 by Vergie Living, RN Outcome: Progressing

## 2020-10-16 NOTE — Progress Notes (Signed)
[facility]  ROUNDING NOTE   Subjective:   [name] was admitted to [facility] on 10/15/2020 for Syncope and collapse [R55] Syncope [R55]  Last hemodialysis treatment was 7/7 inpatient.   Patient was admitted to [facility] from 7/3 to 7/7 for reinitiation of dialysis. He was recently taken off dialysis. He was admitted to weakness, diarrhea and reinitiated on dialysis due to uremia and hyperkalemia.   Yesterday, patient had a syncopal episode. He states he is not eating and feels dehydrated.   Placed on hemodialysis treatment.        Objective:  Vital signs in last 24 hours:  Temp:  [97.4 F (36.3 C)-98.6 F (37 C)] 98 F (36.7 C) (07/09 1330) Pulse Rate:  [57-66] 57 (07/09 1415) Resp:  [14-22] 19 (07/09 1415) BP: (93-131)/(63-82) 94/63 (07/09 1415) SpO2:  [93 %-100 %] 99 % (07/09 1415) Weight:  [68.8 kg-69.7 kg] 69.7 kg (07/09 0500)  Weight change:  Filed Weights   10/15/20 2022 10/16/20 0500  Weight: 68.8 kg 69.7 kg    Intake/Output: I/O last 3 completed shifts: In: 1113.3 [P.O.:480; NG/GT:133.3; IV Piggyback:500] Out: -    Intake/Output this shift:  No intake/output data recorded.  Physical Exam: General: NAD, laying in bed  Head: Normocephalic, atraumatic. Moist oral mucosal membranes  Eyes: Anicteric, PERRL  Neck: Supple, trachea midline  Lungs:  Clear to auscultation  Heart: Regular rate and rhythm  Abdomen:  Soft, nontender,   Extremities: no peripheral edema.  Neurologic: Nonfocal, moving all four extremities  Skin: No lesions  Access: RIJ permcath    Basic Metabolic Panel: Recent Labs  Lab 10/11/20 1447 10/12/20 0447 10/13/20 0424 10/14/20 0444 10/15/20 2024 10/16/20 0539 10/16/20 1009  NA  --  131* 132* 134* 132* 131* 133*  K  --  5.9* 4.3 4.0 4.1 4.1 4.1  CL  --  97* 95* 95* 92* 94* 96*  CO2  --  26 30 29 27 29 28   GLUCOSE  --  222* 123* 117* 222* 179* 175*  BUN  --  75* 52* 38* 69* 69* 70*  CREATININE  --  2.55* 2.64*  2.66* 4.04* 3.90* 4.14*  CALCIUM  --  9.1 8.9 8.7* 8.7* 8.6* 8.7*  MG  --  1.8  --  1.6* 1.9  --   --   PHOS 5.4* 4.1  --   --   --   --   --     Liver Function Tests: Recent Labs  Lab 10/10/20 1016 10/15/20 2024  AST 18 22  ALT 13 16  ALKPHOS 92 60  BILITOT 0.5 0.6  PROT 7.4 6.2*  ALBUMIN 4.0 3.4*   No results for input(s): LIPASE, AMYLASE in the last 168 hours. Recent Labs  Lab 10/10/20 1500  AMMONIA 9    CBC: Recent Labs  Lab 10/12/20 0447 10/13/20 0424 10/14/20 0444 10/15/20 2024 10/16/20 0539 10/16/20 1009  WBC 2.7* 2.7* 2.1* 2.0* 2.5*  --   NEUTROABS 1.8 1.4*  --  1.1*  --   --   HGB 8.5* 7.7* 7.1* 7.8* 6.6* 6.6*  HCT 24.4* 21.9* 20.2* 22.5* 19.0* 19.0*  MCV 100.4* 97.3 99.0 99.6 96.9  --   PLT 161 142* 117* 123* 116*  --     Cardiac Enzymes: No results for input(s): CKTOTAL, CKMB, CKMBINDEX, TROPONINI in the last 168 hours.  BNP: Invalid input(s): POCBNP  CBG: Recent Labs  Lab 10/14/20 0738 10/14/20 1141 10/16/20 0528 10/16/20 0811 10/16/20 1200  GLUCAP  106* 107* 170* 167* 153*    Microbiology: Results for orders placed or performed during the hospital encounter of 10/15/20  Resp Panel by RT-PCR (Flu A&B, Covid) Nasopharyngeal Swab     Status: None   Collection Time: 10/15/20  8:24 PM   Specimen: Nasopharyngeal Swab; Nasopharyngeal(NP) swabs in vial transport medium  Result Value Ref Range Status   SARS Coronavirus 2 by RT PCR NEGATIVE NEGATIVE Final    Comment: (NOTE) SARS-CoV-2 target nucleic acids are NOT DETECTED.  The SARS-CoV-2 RNA is generally detectable in upper respiratory specimens during the acute phase of infection. The lowest concentration of SARS-CoV-2 viral copies this assay can detect is 138 copies/mL. A negative result does not preclude SARS-Cov-2 infection and should not be used as the sole basis for treatment or other patient management decisions. A negative result may occur with  improper specimen  collection/handling, submission of specimen other than nasopharyngeal swab, presence of viral mutation(s) within the areas targeted by this assay, and inadequate number of viral copies(<138 copies/mL). A negative result must be combined with clinical observations, patient history, and epidemiological information. The expected result is Negative.  Fact Sheet for Patients:  EntrepreneurPulse.com.au  Fact Sheet for Healthcare Providers:  IncredibleEmployment.be  This test is no t yet approved or cleared by the [place] FDA and  has been authorized for detection and/or diagnosis of SARS-CoV-2 by FDA under an Emergency Use Authorization (EUA). This EUA will remain  in effect (meaning this test can be used) for the duration of the COVID-19 declaration under Section 564(b)(1) of the Act, 21 U.S.C.section 360bbb-3(b)(1), unless the authorization is terminated  or revoked sooner.       Influenza A by PCR NEGATIVE NEGATIVE Final   Influenza B by PCR NEGATIVE NEGATIVE Final    Comment: (NOTE) The Xpert Xpress SARS-CoV-2/FLU/RSV plus assay is intended as an aid in the diagnosis of influenza from Nasopharyngeal swab specimens and should not be used as a sole basis for treatment. Nasal washings and aspirates are unacceptable for Xpert Xpress SARS-CoV-2/FLU/RSV testing.  Fact Sheet for Patients: EntrepreneurPulse.com.au  Fact Sheet for Healthcare Providers: IncredibleEmployment.be  This test is not yet approved or cleared by the [place] FDA and has been authorized for detection and/or diagnosis of SARS-CoV-2 by FDA under an Emergency Use Authorization (EUA). This EUA will remain in effect (meaning this test can be used) for the duration of the COVID-19 declaration under Section 564(b)(1) of the Act, 21 U.S.C. section 360bbb-3(b)(1), unless the authorization is terminated or revoked.  Performed at [facility], [address] [place], [place] [address]     Coagulation Studies: No results for input(s): LABPROT, INR in the last 72 hours.  Urinalysis: No results for input(s): COLORURINE, LABSPEC, PHURINE, GLUCOSEU, HGBUR, BILIRUBINUR, KETONESUR, PROTEINUR, UROBILINOGEN, NITRITE, LEUKOCYTESUR in the last 72 hours.  Invalid input(s): APPERANCEUR    Imaging: DG Abd 1 View  Result Date: 10/16/2020 CLINICAL DATA:  Nausea. EXAM: ABDOMEN - 1 VIEW COMPARISON:  Plain film of the abdomen dated 10/14/2020. FINDINGS: No dilated large or small bowel loops are identified. Gastrojejunal feeding tube in place. No evidence of renal or ureteral calculi. No evidence of abnormal fluid collection. No acute appearing osseous abnormality. IMPRESSION: Nonobstructive bowel gas pattern. Gastrojejunal feeding tube in place. Electronically Signed   By: Franki Cabot M.D.   On: 10/16/2020 12:11   US Carotid Bilateral  Result Date: 10/16/2020 CLINICAL DATA:  65 year old male with syncope. EXAM: BILATERAL CAROTID DUPLEX ULTRASOUND TECHNIQUE: Pearline Cables scale  imaging, color Doppler and duplex ultrasound were performed of bilateral carotid and vertebral arteries in the neck. COMPARISON:  None. FINDINGS: Criteria: Quantification of carotid stenosis is based on velocity parameters that correlate the residual internal carotid diameter with NASCET-based stenosis levels, using the diameter of the distal internal carotid lumen as the denominator for stenosis measurement. The following velocity measurements were obtained: RIGHT ICA: 64 cm/sec CCA: 76 cm/sec SYSTOLIC ICA/CCA RATIO:  0.8 ECA: 120 cm/sec LEFT ICA: 79 cm/sec CCA: 85 cm/sec SYSTOLIC ICA/CCA RATIO:  0.9 ECA: 171 cm/sec RIGHT CAROTID ARTERY: There is plaque involving the carotid bulb and proximal ICA. RIGHT VERTEBRAL ARTERY:  Antegrade flow. LEFT CAROTID ARTERY: There is plaque involving the carotid bulb and proximal ICA. LEFT VERTEBRAL ARTERY:  Antegrade flow. IMPRESSION:  Less than 50% ICA stenosis bilaterally. Electronically Signed   By: Anner Crete M.D.   On: 10/16/2020 01:09     Medications:    sodium chloride     promethazine (PHENERGAN) injection (IM or IVPB)      atorvastatin  40 mg Oral QHS   Chlorhexidine Gluconate Cloth  6 each Topical Daily   feeding supplement (NEPRO CARB STEADY)  1,000 mL Per Tube Q24H   heparin  5,000 Units Subcutaneous Q8H   lactulose  20 g Oral Daily   metoCLOPramide (REGLAN) injection  5 mg Intravenous Q8H   metoprolol tartrate  25 mg Oral BID   mirtazapine  7.5 mg Oral QHS   mycophenolate  1,000 mg Oral BID   ondansetron (ZOFRAN) IV  4 mg Intravenous Q8H   pantoprazole (PROTONIX) IV  40 mg Intravenous Q12H   predniSONE  17.5 mg Oral Daily   [START ON 10/18/2020] sulfamethoxazole-trimethoprim  1 tablet Oral Q M,W,F   tacrolimus  7 mg Oral BID   [START ON 10/18/2020] valGANciclovir  450 mg Oral Once per day on Mon Thu   acetaminophen **OR** acetaminophen, hydrOXYzine, nitroGLYCERIN, ondansetron **OR** ondansetron (ZOFRAN) IV, promethazine (PHENERGAN) injection (IM or IVPB), senna-docusate, traZODone  Assessment/ Plan:  Alex Hampton is a 65 y.o. white male with end stager renal disease, status post liver transplant, hyperlipidemia, hypertension, coronary artery disease, depression who is re-admitted to [facility] on 10/15/2020 for Syncope and collapse [R55] Syncope [R55]  [facility] TTS [address] RIJ permcath 69kg  End Stage renal disease: seen and examined on hemodialysis.   Hypotension: 94/63 - could be due to volume depletion and poor PO intake.  Holding metoprolol.   Anemia of chronic kidney disease: hemoglobin 6.6. EPO with HD treatment. PRBC transfusion scheduled.   Secondary Hyperparathyroidism: calcium and phosphorus at goal. Not currently on binders.     LOS: 0 Child Campoy 7/9/20222:51 PM

## 2020-10-17 ENCOUNTER — Inpatient Hospital Stay: Payer: Medicare Other

## 2020-10-17 LAB — CBC WITH DIFFERENTIAL/PLATELET
Abs Immature Granulocytes: 0.19 10*3/uL — ABNORMAL HIGH (ref 0.00–0.07)
Basophils Absolute: 0 10*3/uL (ref 0.0–0.1)
Basophils Relative: 1 %
Eosinophils Absolute: 0.1 10*3/uL (ref 0.0–0.5)
Eosinophils Relative: 2 %
HCT: 20.7 % — ABNORMAL LOW (ref 39.0–52.0)
Hemoglobin: 7.2 g/dL — ABNORMAL LOW (ref 13.0–17.0)
Immature Granulocytes: 9 %
Lymphocytes Relative: 30 %
Lymphs Abs: 0.6 10*3/uL — ABNORMAL LOW (ref 0.7–4.0)
MCH: 34.3 pg — ABNORMAL HIGH (ref 26.0–34.0)
MCHC: 34.8 g/dL (ref 30.0–36.0)
MCV: 98.6 fL (ref 80.0–100.0)
Monocytes Absolute: 0.2 10*3/uL (ref 0.1–1.0)
Monocytes Relative: 11 %
Neutro Abs: 1 10*3/uL — ABNORMAL LOW (ref 1.7–7.7)
Neutrophils Relative %: 47 %
Platelets: 112 10*3/uL — ABNORMAL LOW (ref 150–400)
RBC: 2.1 MIL/uL — ABNORMAL LOW (ref 4.22–5.81)
RDW: 15.5 % (ref 11.5–15.5)
Smear Review: NORMAL
WBC: 2.1 10*3/uL — ABNORMAL LOW (ref 4.0–10.5)
nRBC: 0 % (ref 0.0–0.2)

## 2020-10-17 LAB — BASIC METABOLIC PANEL
Anion gap: 6 (ref 5–15)
BUN: 34 mg/dL — ABNORMAL HIGH (ref 8–23)
CO2: 31 mmol/L (ref 22–32)
Calcium: 8.4 mg/dL — ABNORMAL LOW (ref 8.9–10.3)
Chloride: 99 mmol/L (ref 98–111)
Creatinine, Ser: 2.57 mg/dL — ABNORMAL HIGH (ref 0.61–1.24)
GFR, Estimated: 27 mL/min — ABNORMAL LOW (ref >60)
Glucose, Bld: 167 mg/dL — ABNORMAL HIGH (ref 70–99)
Potassium: 3.3 mmol/L — ABNORMAL LOW (ref 3.5–5.1)
Sodium: 136 mmol/L (ref 135–145)

## 2020-10-17 LAB — GLUCOSE, CAPILLARY
Glucose-Capillary: 142 mg/dL — ABNORMAL HIGH (ref 70–99)
Glucose-Capillary: 157 mg/dL — ABNORMAL HIGH (ref 70–99)
Glucose-Capillary: 158 mg/dL — ABNORMAL HIGH (ref 70–99)
Glucose-Capillary: 169 mg/dL — ABNORMAL HIGH (ref 70–99)
Glucose-Capillary: 184 mg/dL — ABNORMAL HIGH (ref 70–99)
Glucose-Capillary: 239 mg/dL — ABNORMAL HIGH (ref 70–99)

## 2020-10-17 MED ORDER — NEPRO/CARBSTEADY PO LIQD
1190.0000 mL | ORAL | Status: DC
  Administered 2020-10-17 – 2020-10-20 (×4): 1190 mL

## 2020-10-17 MED ORDER — SODIUM CHLORIDE 0.9 % IV SOLN: INTRAVENOUS | Status: AC

## 2020-10-17 MED ORDER — SODIUM CHLORIDE 0.9 % IV SOLN
1.0000 g | INTRAVENOUS | Status: DC
  Administered 2020-10-18 – 2020-10-20 (×3): 1 g via INTRAVENOUS
  Filled 2020-10-17 (×4): qty 1

## 2020-10-17 MED ORDER — VANCOMYCIN HCL 750 MG/150ML IV SOLN
750.0000 mg | INTRAVENOUS | Status: DC
  Administered 2020-10-19: 750 mg via INTRAVENOUS
  Filled 2020-10-17 (×3): qty 150

## 2020-10-17 MED ORDER — FREE WATER
30.0000 mL | Status: DC
  Administered 2020-10-17 – 2020-10-21 (×17): 30 mL

## 2020-10-17 MED ORDER — POTASSIUM CHLORIDE CRYS ER 20 MEQ PO TBCR
40.0000 meq | EXTENDED_RELEASE_TABLET | Freq: Once | ORAL | Status: AC
  Administered 2020-10-17: 40 meq via ORAL
  Filled 2020-10-17: qty 2

## 2020-10-17 MED ORDER — SODIUM CHLORIDE 0.9 % IV SOLN
2.0000 g | Freq: Once | INTRAVENOUS | Status: AC
  Administered 2020-10-18: 2 g via INTRAVENOUS
  Filled 2020-10-17: qty 2

## 2020-10-17 MED ORDER — VANCOMYCIN HCL 1250 MG/250ML IV SOLN
1250.0000 mg | Freq: Once | INTRAVENOUS | Status: AC
  Administered 2020-10-18: 1250 mg via INTRAVENOUS
  Filled 2020-10-17: qty 250

## 2020-10-17 MED ORDER — METOCLOPRAMIDE HCL 5 MG/ML IJ SOLN
5.0000 mg | Freq: Three times a day (TID) | INTRAMUSCULAR | Status: AC
  Administered 2020-10-17 (×3): 5 mg via INTRAVENOUS
  Filled 2020-10-17 (×3): qty 2

## 2020-10-17 MED ORDER — LACTULOSE 10 GM/15ML PO SOLN
20.0000 g | Freq: Two times a day (BID) | ORAL | Status: DC
  Filled 2020-10-17 (×3): qty 30

## 2020-10-17 MED ORDER — RENA-VITE PO TABS
1.0000 | ORAL_TABLET | Freq: Every day | ORAL | Status: DC
  Administered 2020-10-17 – 2020-10-20 (×4): 1
  Filled 2020-10-17 (×5): qty 1

## 2020-10-17 MED ORDER — LACTATED RINGERS IV SOLN: INTRAVENOUS | Status: DC

## 2020-10-17 NOTE — Progress Notes (Signed)
[facility]  ROUNDING NOTE   Subjective:   Hemodialysis treatment yesterday. Tolerated treatment well. No ultrafiltration. Net 1.7 liters yesterday.   He states he is not eating well this morning. Continues to complain of nausea.   Denies any more syncope.    PRBC transfusion yesterday   Objective:  Vital signs in last 24 hours:  Temp:  [97.9 F (36.6 C)-99.1 F (37.3 C)] 97.9 F (36.6 C) (07/10 1122) Pulse Rate:  [57-100] 79 (07/10 1127) Resp:  [14-22] 16 (07/10 1122) BP: (80-129)/(56-71) 99/61 (07/10 1127) SpO2:  [95 %-100 %] 100 % (07/10 1122) Weight:  [71.6 kg] 71.6 kg (07/10 0412)  Weight change: 2.8 kg Filed Weights   10/15/20 2022 10/16/20 0500 10/17/20 0412  Weight: 68.8 kg 69.7 kg 71.6 kg    Intake/Output: I/O last 3 completed shifts: In: 2395.2 [P.O.:480; Blood:379.3; NG/GT:1035.8; IV Piggyback:500] Out: -505 [Urine:250]   Intake/Output this shift:  No intake/output data recorded.  Physical Exam: General: NAD, laying in bed. Cachectic  Head: Normocephalic, atraumatic. Dry oral mucosal membranes  Eyes: Anicteric, PERRL  Neck: Supple, trachea midline  Lungs:  Clear to auscultation  Heart: Regular rate and rhythm  Abdomen:  Soft, nontender,   Extremities: no peripheral edema.  Neurologic: Nonfocal, moving all four extremities  Skin: No lesions  Access: RIJ permcath    Basic Metabolic Panel: Recent Labs  Lab 10/11/20 1447 10/12/20 0447 10/13/20 0424 10/14/20 0444 10/15/20 2024 10/16/20 0539 10/16/20 1009 10/17/20 0611  NA  --  131*   < > 134* 132* 131* 133* 136  K  --  5.9*   < > 4.0 4.1 4.1 4.1 3.3*  CL  --  97*   < > 95* 92* 94* 96* 99  CO2  --  26   < > 29 27 29 28 31   GLUCOSE  --  222*   < > 117* 222* 179* 175* 167*  BUN  --  75*   < > 38* 69* 69* 70* 34*  CREATININE  --  2.55*   < > 2.66* 4.04* 3.90* 4.14* 2.57*  CALCIUM  --  9.1   < > 8.7* 8.7* 8.6* 8.7* 8.4*  MG  --  1.8  --  1.6* 1.9  --   --   --   PHOS 5.4* 4.1   --   --   --   --   --   --    < > = values in this interval not displayed.     Liver Function Tests: Recent Labs  Lab 10/15/20 2024  AST 22  ALT 16  ALKPHOS 60  BILITOT 0.6  PROT 6.2*  ALBUMIN 3.4*    No results for input(s): LIPASE, AMYLASE in the last 168 hours. Recent Labs  Lab 10/10/20 1500  AMMONIA 9     CBC: Recent Labs  Lab 10/12/20 0447 10/13/20 0424 10/14/20 0444 10/15/20 2024 10/16/20 0539 10/16/20 1009 10/17/20 0611  WBC 2.7* 2.7* 2.1* 2.0* 2.5*  --  2.1*  NEUTROABS 1.8 1.4*  --  1.1*  --   --  1.0*  HGB 8.5* 7.7* 7.1* 7.8* 6.6* 6.6* 7.2*  HCT 24.4* 21.9* 20.2* 22.5* 19.0* 19.0* 20.7*  MCV 100.4* 97.3 99.0 99.6 96.9  --  98.6  PLT 161 142* 117* 123* 116*  --  112*     Cardiac Enzymes: No results for input(s): CKTOTAL, CKMB, CKMBINDEX, TROPONINI in the last 168 hours.  BNP: Invalid input(s): POCBNP  CBG: Recent Labs  Lab 10/16/20 1922 10/17/20 0007 10/17/20 0359 10/17/20 0801 10/17/20 1120  GLUCAP 169* 184* 158* 157* 142*     Microbiology: Results for orders placed or performed during the hospital encounter of 10/15/20  Resp Panel by RT-PCR (Flu A&B, Covid) Nasopharyngeal Swab     Status: None   Collection Time: 10/15/20  8:24 PM   Specimen: Nasopharyngeal Swab; Nasopharyngeal(NP) swabs in vial transport medium  Result Value Ref Range Status   SARS Coronavirus 2 by RT PCR NEGATIVE NEGATIVE Final    Comment: (NOTE) SARS-CoV-2 target nucleic acids are NOT DETECTED.  The SARS-CoV-2 RNA is generally detectable in upper respiratory specimens during the acute phase of infection. The lowest concentration of SARS-CoV-2 viral copies this assay can detect is 138 copies/mL. A negative result does not preclude SARS-Cov-2 infection and should not be used as the sole basis for treatment or other patient management decisions. A negative result may occur with  improper specimen collection/handling, submission of specimen other than  nasopharyngeal swab, presence of viral mutation(s) within the areas targeted by this assay, and inadequate number of viral copies(<138 copies/mL). A negative result must be combined with clinical observations, patient history, and epidemiological information. The expected result is Negative.  Fact Sheet for Patients:  EntrepreneurPulse.com.au  Fact Sheet for Healthcare Providers:  IncredibleEmployment.be  This test is no t yet approved or cleared by the [place] FDA and  has been authorized for detection and/or diagnosis of SARS-CoV-2 by FDA under an Emergency Use Authorization (EUA). This EUA will remain  in effect (meaning this test can be used) for the duration of the COVID-19 declaration under Section 564(b)(1) of the Act, 21 U.S.C.section 360bbb-3(b)(1), unless the authorization is terminated  or revoked sooner.       Influenza A by PCR NEGATIVE NEGATIVE Final   Influenza B by PCR NEGATIVE NEGATIVE Final    Comment: (NOTE) The Xpert Xpress SARS-CoV-2/FLU/RSV plus assay is intended as an aid in the diagnosis of influenza from Nasopharyngeal swab specimens and should not be used as a sole basis for treatment. Nasal washings and aspirates are unacceptable for Xpert Xpress SARS-CoV-2/FLU/RSV testing.  Fact Sheet for Patients: EntrepreneurPulse.com.au  Fact Sheet for Healthcare Providers: IncredibleEmployment.be  This test is not yet approved or cleared by the [place] FDA and has been authorized for detection and/or diagnosis of SARS-CoV-2 by FDA under an Emergency Use Authorization (EUA). This EUA will remain in effect (meaning this test can be used) for the duration of the COVID-19 declaration under Section 564(b)(1) of the Act, 21 U.S.C. section 360bbb-3(b)(1), unless the authorization is terminated or revoked.  Performed at [facility], [address] [place], [place]  [address]     Coagulation Studies: No results for input(s): LABPROT, INR in the last 72 hours.  Urinalysis: No results for input(s): COLORURINE, LABSPEC, PHURINE, GLUCOSEU, HGBUR, BILIRUBINUR, KETONESUR, PROTEINUR, UROBILINOGEN, NITRITE, LEUKOCYTESUR in the last 72 hours.  Invalid input(s): APPERANCEUR    Imaging: DG Abd 1 View  Result Date: 10/16/2020 CLINICAL DATA:  Nausea. EXAM: ABDOMEN - 1 VIEW COMPARISON:  Plain film of the abdomen dated 10/14/2020. FINDINGS: No dilated large or small bowel loops are identified. Gastrojejunal feeding tube in place. No evidence of renal or ureteral calculi. No evidence of abnormal fluid collection. No acute appearing osseous abnormality. IMPRESSION: Nonobstructive bowel gas pattern. Gastrojejunal feeding tube in place. Electronically Signed   By: Franki Cabot M.D.   On: 10/16/2020 12:11   US Carotid Bilateral  Result Date: 10/16/2020 CLINICAL  DATA:  64 year old male with syncope. EXAM: BILATERAL CAROTID DUPLEX ULTRASOUND TECHNIQUE: Pearline Cables scale imaging, color Doppler and duplex ultrasound were performed of bilateral carotid and vertebral arteries in the neck. COMPARISON:  None. FINDINGS: Criteria: Quantification of carotid stenosis is based on velocity parameters that correlate the residual internal carotid diameter with NASCET-based stenosis levels, using the diameter of the distal internal carotid lumen as the denominator for stenosis measurement. The following velocity measurements were obtained: RIGHT ICA: 64 cm/sec CCA: 76 cm/sec SYSTOLIC ICA/CCA RATIO:  0.8 ECA: 120 cm/sec LEFT ICA: 79 cm/sec CCA: 85 cm/sec SYSTOLIC ICA/CCA RATIO:  0.9 ECA: 171 cm/sec RIGHT CAROTID ARTERY: There is plaque involving the carotid bulb and proximal ICA. RIGHT VERTEBRAL ARTERY:  Antegrade flow. LEFT CAROTID ARTERY: There is plaque involving the carotid bulb and proximal ICA. LEFT VERTEBRAL ARTERY:  Antegrade flow. IMPRESSION: Less than 50% ICA stenosis bilaterally. Electronically  Signed   By: Anner Crete M.D.   On: 10/16/2020 01:09     Medications:    lactated ringers 50 mL/hr at 10/17/20 1038   promethazine (PHENERGAN) injection (IM or IVPB)      atorvastatin  40 mg Oral QHS   Chlorhexidine Gluconate Cloth  6 each Topical Daily   [START ON 10/19/2020] epoetin (EPOGEN/PROCRIT) injection  10,000 Units Intravenous Q T,Th,Sa-HD   feeding supplement (NEPRO CARB STEADY)  1,000 mL Per Tube Q24H   heparin  5,000 Units Subcutaneous Q8H   lactulose  20 g Oral Daily   metoCLOPramide (REGLAN) injection  5 mg Intravenous Q8H   mirtazapine  7.5 mg Oral QHS   mycophenolate  1,000 mg Oral BID   ondansetron (ZOFRAN) IV  4 mg Intravenous Q8H   pantoprazole (PROTONIX) IV  40 mg Intravenous Q12H   predniSONE  17.5 mg Oral Daily   [START ON 10/18/2020] sulfamethoxazole-trimethoprim  1 tablet Oral Q M,W,F   tacrolimus  7 mg Oral BID   [START ON 10/18/2020] valGANciclovir  450 mg Oral Once per day on Mon Thu   acetaminophen **OR** acetaminophen, hydrOXYzine, nitroGLYCERIN, ondansetron **OR** ondansetron (ZOFRAN) IV, promethazine (PHENERGAN) injection (IM or IVPB), senna-docusate, traZODone  Assessment/ Plan:  Alex Hampton is a 65 y.o. white male with end stager renal disease, status post liver transplant, hyperlipidemia, hypertension, coronary artery disease, depression who is re-admitted to [facility] on 10/15/2020 for Syncope and collapse [R55] Syncope [R55]  [facility] TTS [address] RIJ permcath 69kg  End Stage renal disease: Continue TTS schedule. Tolerated dialysis yesterday with no ultrafiltration.    Hypotension: 99/61 - could be due to volume depletion and poor PO intake.  Holding metoprolol. - Will give IV normal saline 1 liter   Anemia of chronic kidney disease: hemoglobin 7.2 - status post PRBC transfusion on 7/9.  -  EPO with HD treatments  Secondary Hyperparathyroidism: calcium and phosphorus at goal. Not currently on binders.     LOS: 1 Stepheny Canal 7/10/202211:55 AM

## 2020-10-17 NOTE — TOC Progression Note (Signed)
Transition of Care [facility]) - Progression Note    Patient Details  Name: Alex Hampton MRN: [id number] Date of Birth: 1955-11-10  [facility] of [facility] CM/SW Contact  Izola Price, RN Phone Number: 10/17/2020, 5:13 PM  Clinical Narrative:  7/10: Inpatient status. Syncope of unknown origin. MRI today. Rec'd blood for anemia.  S/p Liver Transplant. DispositionTBD. EDD 2-3 days. Simmie Davies RN CM          Expected Discharge Plan and Services                                                 Social Determinants of Health (SDOH) Interventions    Readmission Risk Interventions Readmission Risk Prevention Plan 10/11/2020  Transportation Screening Complete  PCP or Specialist Appt within 3-5 Days Complete  HRI or [place] Complete  Social Work Consult for [place] Planning/Counseling Complete  Palliative Care Screening Not Applicable  Medication Review Press photographer) Complete  Some recent data might be hidden

## 2020-10-17 NOTE — Progress Notes (Signed)
Pharmacy Antibiotic Note  Alex Hampton is a 65 y.o. male with ESRD on HD, Cirrhosis s/p Liver Transplant on immunosuppressants, Severe Protein Calorie Malnutrition with G tube in placeadmitted on 10/15/2020 with recurrent nausea and vomiting x 3 days followed by 2 syncopal episodes and now with antibiotics being started for sepsis. Pharmacy has been consulted for vancomycin and cefepime dosing.  Plan:  1) start cefepime 2 grams now, then 1 gram IV every 24 hours to be timed after HD sessions  2) start vancomycin 1250 mg IV now then 750 mg IV with each HD session TTS Level prior to 3rd HD session: target level 15 - 25 mcg/mL   Height: 6\' 1"  (185.4 cm) Weight: 71.6 kg (157 lb 13.6 oz) IBW/kg (Calculated) : 79.9  Temp (24hrs), Avg:98.3 F (36.8 C), Min:97.5 F (36.4 C), Max:99.1 F (37.3 C)  Recent Labs  Lab 10/13/20 0424 10/14/20 0444 10/15/20 2024 10/16/20 0539 10/16/20 1009 10/17/20 0611  WBC 2.7* 2.1* 2.0* 2.5*  --  2.1*  CREATININE 2.64* 2.66* 4.04* 3.90* 4.14* 2.57*    Estimated Creatinine Clearance: 28.6 mL/min (A) (by C-G formula based on SCr of 2.57 mg/dL (H)).    No Known Allergies  Antimicrobials this admission: vancomycin 07/10 >>  cefepime 07/10 >>   Microbiology results: 07/10 BCx: pending 07/08 SARS CoV-2: negative 07/08 influenza A/B: negative  Thank you for allowing pharmacy to be a part of this patient's care.  Dallie Piles 10/17/2020 11:35 PM

## 2020-10-17 NOTE — Progress Notes (Addendum)
PROGRESS NOTE    Alex Hampton  ION:[id number] DOB: 1955/10/31 DOA: 10/15/2020 PCP: Juluis Pitch, MD   Chief Complaint  Patient presents with   Loss of Consciousness    Brief Narrative: 65 year old male with multiple medical problems including ESRD on HD TTS, Liver Cirrhosis s/p Liver Transplant on immunosuppressants, Severe Protein Calorie Malnutrition with G tube in place who presents to the ED with recurrent nausea and vomiting x 3 days followed by 2 syncopal episodes at home while walking to the bathroom. Son reports patient had multiple bowel movements prior to passing out.  There was no seizure like activity witnessed by the son.  Alex Hampton cannot recall details of the event.  But he denies having any chest pain or shortness of breath.  Subjective: Alex Hampton looks better today. Still dehydrated. Denies any more nausea. Hb improved from 6.6 to 7.2 g/dL with 1 unit pRBC and Epogen. Seen by Nephrology today, appreciate.   Assessment & Plan: Active Problems:   Syncope  Bands noted on differential: - This may be related to bone marrow suppression from chronic diseases vs acute infection.  - Check Blood Cultures. - Monitor closely for any signs of infection - low threshold to start antibiotics.  Nausea and Vomiting, improved: - Continue IV Zofran 4 mg q6hrs for another 24 hours. - Give IV Phenergan PRN for nausea. - Continue IV Reglan 5 mg TID x 24 hours.  Monitor renal function. - Continue IV Protonix 40 mg BID. - Give another bolus of fluids today. - Discharge on Zofran PRN.  Acute Anemia: There is no evidence of any acute bleeding. 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 - S/P 7/9 1 unit pRBC. - HH remains low at 7.2/20. - Continue to hold Aspirin and Plavix.  Acute Syncope: This sounds vasovagal based on story.  There was no evidence of any seizures.  - 7/9 Echo was completed. Report is pending. - 7/10 MRI of brain showed no acute abnormality. - Follow up with Neurologist in  [place], [place] to discuss recurrent syncopal episodes.  ESRD on HD TTS: - Volume status is euvolemic. - Nephrology is following, appreciate.  Acute Metabolic Encephalopathy - uremia vs other, improved: - Hold all sedating meds - Gabapentin, Lexapro, Remeron. - Increase Lactulose 20 mg from QD to BID.  Patient has had 1 BM.  Titrate to 2-3 bowel movements daily and improvement in mental status.  Hypotension, resolved  Diabetes Mellitus Type 2: Glucose is stable. - Sliding Scale plus POC glucose q6hrs.  Liver Transplant: - FU Hepatology outpatient.  CAD: - Hold DAPT due to acute anemia.    Diet Order             Diet regular Room service appropriate? Yes; Fluid consistency: Thin  Diet effective now                   Nutrition Problem: Inadequate oral intake Etiology: chronic illness Signs/Symptoms: other (comment) ([name])   Patient's Body mass index is 20.83 kg/m.    DVT prophylaxis: heparin injection 5,000 Units Start: 10/16/20 0145 Code Status:   Code Status: Full Code  Family Communication: plan of care discussed with patient at bedside.  Status is: Inpatient  Remains inpatient appropriate because:Inpatient level of care appropriate due to severity of illness  Dispo: The patient is from: Home              Anticipated d/c is to: Home Hopefully Discharge Home tomorrow.  Patient currently is not medically stable to d/c.   Difficult to place patient No     Unresulted Labs (From admission, onward)     Start     Ordered   10/17/20 3295  Basic metabolic panel  Daily,   R     Question:  Specimen collection method  Answer:  Lab=Lab collect   10/16/20 2128   10/17/20 0500  CBC with Differential/Platelet  Daily,   R     Question:  Specimen collection method  Answer:  Lab=Lab collect   10/16/20 2128   10/16/20 0743  Occult blood card to lab, stool  Once,   R        10/16/20 0745            Medications reviewed:   Scheduled Meds:  atorvastatin  40 mg Oral QHS   Chlorhexidine Gluconate Cloth  6 each Topical Daily   [START ON 10/19/2020] epoetin (EPOGEN/PROCRIT) injection  10,000 Units Intravenous Q T,Th,Sa-HD   feeding supplement (NEPRO CARB STEADY)  1,190 mL Per Tube Q24H   free water  30 mL Per Tube Q4H   heparin  5,000 Units Subcutaneous Q8H   lactulose  20 g Oral Daily   metoCLOPramide (REGLAN) injection  5 mg Intravenous Q8H   mirtazapine  7.5 mg Oral QHS   multivitamin  1 tablet Per Tube QHS   mycophenolate  1,000 mg Oral BID   ondansetron (ZOFRAN) IV  4 mg Intravenous Q8H   pantoprazole (PROTONIX) IV  40 mg Intravenous Q12H   predniSONE  17.5 mg Oral Daily   [START ON 10/18/2020] sulfamethoxazole-trimethoprim  1 tablet Oral Q M,W,F   tacrolimus  7 mg Oral BID   [START ON 10/18/2020] valGANciclovir  450 mg Oral Once per day on Mon Thu   Continuous Infusions:  sodium chloride     promethazine (PHENERGAN) injection (IM or IVPB)      Consultants:see note  Procedures:see note  Antimicrobials: Anti-infectives (From admission, onward)    Start     Dose/Rate Route Frequency Ordered Stop   10/18/20 1000  sulfamethoxazole-trimethoprim (BACTRIM DS) 800-160 MG per tablet 1 tablet        1 tablet Oral Every M-W-F 10/15/20 2147     10/18/20 0900  valGANciclovir (VALCYTE) 450 MG tablet TABS 450 mg       Note to Pharmacy: (Monday and Thursday) with largest meal of the day.     450 mg Oral Once per day on Mon Thu 10/15/20 2147        Culture/Microbiology No results found for: SDES, SPECREQUEST, CULT, REPTSTATUS  Other culture-see note  Objective: Vitals: Today's Vitals   10/17/20 0801 10/17/20 1122 10/17/20 1125 10/17/20 1127  BP: 104/67 99/63 105/62 99/61  Pulse: 60 67 70 79  Resp: 16 16    Temp: 98 F (36.7 C) 97.9 F (36.6 C)    TempSrc: Oral Oral    SpO2: 100% 100%    Weight:      Height:      PainSc:        Intake/Output Summary (Last 24 hours) at 10/17/2020 1419 Last data  filed at 10/17/2020 1346 Gross per 24 hour  Intake 1281.83 ml  Output -505 ml  Net 1786.83 ml    Filed Weights   10/15/20 2022 10/16/20 0500 10/17/20 0412  Weight: 68.8 kg 69.7 kg 71.6 kg   Weight change: 2.8 kg  Intake/Output from previous day: 07/09 0701 - 07/10 0700 In: 1281.8 [Blood:379.3; NG/GT:902.5] Out: -  505 [Urine:250] Intake/Output this shift: No intake/output data recorded. Filed Weights   10/15/20 2022 10/16/20 0500 10/17/20 0412  Weight: 68.8 kg 69.7 kg 71.6 kg    Examination: General exam: chronically ill appearing, occasionally drowsy, RIJ permcath HEENT: NCAT, PERRL, no icterus Respiratory system: CTAB no WRR Cardiovascular system: Did not appreciate a murmur, regular, No JVD. Gastrointestinal system: G tube in place, Abdomen soft, NT,ND, BS+. Nervous System: 4/5 strength on the left, no other focal deficits, cranial nerves intact. Extremities: No edema, distal peripheral pulses palpable.  Skin: scattered bruises on abdomen MSK: Physical Deconditioning  Data Reviewed: I have personally reviewed following labs and imaging studies CBC: Recent Labs  Lab 10/12/20 0447 10/13/20 0424 10/14/20 0444 10/15/20 2024 10/16/20 0539 10/16/20 1009 10/17/20 0611  WBC 2.7* 2.7* 2.1* 2.0* 2.5*  --  2.1*  NEUTROABS 1.8 1.4*  --  1.1*  --   --  1.0*  HGB 8.5* 7.7* 7.1* 7.8* 6.6* 6.6* 7.2*  HCT 24.4* 21.9* 20.2* 22.5* 19.0* 19.0* 20.7*  MCV 100.4* 97.3 99.0 99.6 96.9  --  98.6  PLT 161 142* 117* 123* 116*  --  112*    Basic Metabolic Panel: Recent Labs  Lab 10/11/20 1447 10/12/20 0447 10/13/20 0424 10/14/20 0444 10/15/20 2024 10/16/20 0539 10/16/20 1009 10/17/20 0611  NA  --  131*   < > 134* 132* 131* 133* 136  K  --  5.9*   < > 4.0 4.1 4.1 4.1 3.3*  CL  --  97*   < > 95* 92* 94* 96* 99  CO2  --  26   < > 29 27 29 28 31   GLUCOSE  --  222*   < > 117* 222* 179* 175* 167*  BUN  --  75*   < > 38* 69* 69* 70* 34*  CREATININE  --  2.55*   < > 2.66* 4.04*  3.90* 4.14* 2.57*  CALCIUM  --  9.1   < > 8.7* 8.7* 8.6* 8.7* 8.4*  MG  --  1.8  --  1.6* 1.9  --   --   --   PHOS 5.4* 4.1  --   --   --   --   --   --    < > = values in this interval not displayed.    GFR: Estimated Creatinine Clearance: 28.6 mL/min (A) (by C-G formula based on SCr of 2.57 mg/dL (H)). Liver Function Tests: Recent Labs  Lab 10/15/20 2024  AST 22  ALT 16  ALKPHOS 60  BILITOT 0.6  PROT 6.2*  ALBUMIN 3.4*     Recent Labs  Lab 10/10/20 1500  AMMONIA 9    Coagulation Profile: Recent Labs  Lab 10/10/20 1500  INR 1.1     CBG: Recent Labs  Lab 10/16/20 1922 10/17/20 0007 10/17/20 0359 10/17/20 0801 10/17/20 1120  GLUCAP 169* 184* 158* 157* 142*     Anemia Panel: Recent Labs    10/16/20 0812  RETICCTPCT 0.9     Recent Results (from the past 240 hour(s))  Resp Panel by RT-PCR (Flu A&B, Covid) Nasopharyngeal Swab     Status: None   Collection Time: 10/10/20  2:45 PM   Specimen: Nasopharyngeal Swab; Nasopharyngeal(NP) swabs in vial transport medium  Result Value Ref Range Status   SARS Coronavirus 2 by RT PCR NEGATIVE NEGATIVE Final    Comment: (NOTE) SARS-CoV-2 target nucleic acids are NOT DETECTED.  The SARS-CoV-2 RNA is generally detectable in upper respiratory specimens during  the acute phase of infection. The lowest concentration of SARS-CoV-2 viral copies this assay can detect is 138 copies/mL. A negative result does not preclude SARS-Cov-2 infection and should not be used as the sole basis for treatment or other patient management decisions. A negative result may occur with  improper specimen collection/handling, submission of specimen other than nasopharyngeal swab, presence of viral mutation(s) within the areas targeted by this assay, and inadequate number of viral copies(<138 copies/mL). A negative result must be combined with clinical observations, patient history, and epidemiological information. The expected result is  Negative.  Fact Sheet for Patients:  EntrepreneurPulse.com.au  Fact Sheet for Healthcare Providers:  IncredibleEmployment.be  This test is no t yet approved or cleared by the [place] FDA and  has been authorized for detection and/or diagnosis of SARS-CoV-2 by FDA under an Emergency Use Authorization (EUA). This EUA will remain  in effect (meaning this test can be used) for the duration of the COVID-19 declaration under Section 564(b)(1) of the Act, 21 U.S.C.section 360bbb-3(b)(1), unless the authorization is terminated  or revoked sooner.       Influenza A by PCR NEGATIVE NEGATIVE Final   Influenza B by PCR NEGATIVE NEGATIVE Final    Comment: (NOTE) The Xpert Xpress SARS-CoV-2/FLU/RSV plus assay is intended as an aid in the diagnosis of influenza from Nasopharyngeal swab specimens and should not be used as a sole basis for treatment. Nasal washings and aspirates are unacceptable for Xpert Xpress SARS-CoV-2/FLU/RSV testing.  Fact Sheet for Patients: EntrepreneurPulse.com.au  Fact Sheet for Healthcare Providers: IncredibleEmployment.be  This test is not yet approved or cleared by the [place] FDA and has been authorized for detection and/or diagnosis of SARS-CoV-2 by FDA under an Emergency Use Authorization (EUA). This EUA will remain in effect (meaning this test can be used) for the duration of the COVID-19 declaration under Section 564(b)(1) of the Act, 21 U.S.C. section 360bbb-3(b)(1), unless the authorization is terminated or revoked.  Performed at [facility], [address] [place], [place] [address]   Resp Panel by RT-PCR (Flu A&B, Covid) Nasopharyngeal Swab     Status: None   Collection Time: 10/15/20  8:24 PM   Specimen: Nasopharyngeal Swab; Nasopharyngeal(NP) swabs in vial transport medium  Result Value Ref Range Status   SARS Coronavirus 2 by RT PCR NEGATIVE NEGATIVE  Final    Comment: (NOTE) SARS-CoV-2 target nucleic acids are NOT DETECTED.  The SARS-CoV-2 RNA is generally detectable in upper respiratory specimens during the acute phase of infection. The lowest concentration of SARS-CoV-2 viral copies this assay can detect is 138 copies/mL. A negative result does not preclude SARS-Cov-2 infection and should not be used as the sole basis for treatment or other patient management decisions. A negative result may occur with  improper specimen collection/handling, submission of specimen other than nasopharyngeal swab, presence of viral mutation(s) within the areas targeted by this assay, and inadequate number of viral copies(<138 copies/mL). A negative result must be combined with clinical observations, patient history, and epidemiological information. The expected result is Negative.  Fact Sheet for Patients:  EntrepreneurPulse.com.au  Fact Sheet for Healthcare Providers:  IncredibleEmployment.be  This test is no t yet approved or cleared by the [place] FDA and  has been authorized for detection and/or diagnosis of SARS-CoV-2 by FDA under an Emergency Use Authorization (EUA). This EUA will remain  in effect (meaning this test can be used) for the duration of the COVID-19 declaration under Section 564(b)(1) of the Act, 21 U.S.C.section  360bbb-3(b)(1), unless the authorization is terminated  or revoked sooner.       Influenza A by PCR NEGATIVE NEGATIVE Final   Influenza B by PCR NEGATIVE NEGATIVE Final    Comment: (NOTE) The Xpert Xpress SARS-CoV-2/FLU/RSV plus assay is intended as an aid in the diagnosis of influenza from Nasopharyngeal swab specimens and should not be used as a sole basis for treatment. Nasal washings and aspirates are unacceptable for Xpert Xpress SARS-CoV-2/FLU/RSV testing.  Fact Sheet for Patients: EntrepreneurPulse.com.au  Fact Sheet for Healthcare  Providers: IncredibleEmployment.be  This test is not yet approved or cleared by the [place] FDA and has been authorized for detection and/or diagnosis of SARS-CoV-2 by FDA under an Emergency Use Authorization (EUA). This EUA will remain in effect (meaning this test can be used) for the duration of the COVID-19 declaration under Section 564(b)(1) of the Act, 21 U.S.C. section 360bbb-3(b)(1), unless the authorization is terminated or revoked.  Performed at [facility], [address] [place], [place] [address]       Radiology Studies: DG Abd 1 View  Result Date: 10/16/2020 CLINICAL DATA:  Nausea. EXAM: ABDOMEN - 1 VIEW COMPARISON:  Plain film of the abdomen dated 10/14/2020. FINDINGS: No dilated large or small bowel loops are identified. Gastrojejunal feeding tube in place. No evidence of renal or ureteral calculi. No evidence of abnormal fluid collection. No acute appearing osseous abnormality. IMPRESSION: Nonobstructive bowel gas pattern. Gastrojejunal feeding tube in place. Electronically Signed   By: Franki Cabot M.D.   On: 10/16/2020 12:11   MR BRAIN WO CONTRAST  Result Date: 10/17/2020 CLINICAL DATA:  Neuro deficit, acute stroke suspected.  Dizziness. EXAM: MRI HEAD WITHOUT CONTRAST TECHNIQUE: Multiplanar, multiecho pulse sequences of the brain and surrounding structures were obtained without intravenous contrast. COMPARISON:  MRI October 17, 2019. FINDINGS: Brain: No acute infarction, hemorrhage, hydrocephalus, extra-axial collection or mass lesion. Mild to moderate scattered small T2/FLAIR hyperintensities within the white matter, which are nonspecific but potentially related to chronic microvascular ischemic disease. Vascular: Major arterial flow voids are maintained at the skull base. Skull and upper cervical spine: Normal marrow signal. Sinuses/Orbits: Visualized sinuses are clear.  Unremarkable orbits. Other: No mastoid effusions. IMPRESSION: 1. No  evidence of acute intracranial abnormality. 2. Mild to moderate chronic microvascular ischemic disease. Electronically Signed   By: Margaretha Sheffield MD   On: 10/17/2020 12:26   US Carotid Bilateral  Result Date: 10/16/2020 CLINICAL DATA:  65 year old male with syncope. EXAM: BILATERAL CAROTID DUPLEX ULTRASOUND TECHNIQUE: Pearline Cables scale imaging, color Doppler and duplex ultrasound were performed of bilateral carotid and vertebral arteries in the neck. COMPARISON:  None. FINDINGS: Criteria: Quantification of carotid stenosis is based on velocity parameters that correlate the residual internal carotid diameter with NASCET-based stenosis levels, using the diameter of the distal internal carotid lumen as the denominator for stenosis measurement. The following velocity measurements were obtained: RIGHT ICA: 64 cm/sec CCA: 76 cm/sec SYSTOLIC ICA/CCA RATIO:  0.8 ECA: 120 cm/sec LEFT ICA: 79 cm/sec CCA: 85 cm/sec SYSTOLIC ICA/CCA RATIO:  0.9 ECA: 171 cm/sec RIGHT CAROTID ARTERY: There is plaque involving the carotid bulb and proximal ICA. RIGHT VERTEBRAL ARTERY:  Antegrade flow. LEFT CAROTID ARTERY: There is plaque involving the carotid bulb and proximal ICA. LEFT VERTEBRAL ARTERY:  Antegrade flow. IMPRESSION: Less than 50% ICA stenosis bilaterally. Electronically Signed   By: Anner Crete M.D.   On: 10/16/2020 01:09     LOS: 1 day   George Hugh, MD [facility]  10/17/2020,  2:19 PM

## 2020-10-17 NOTE — Progress Notes (Signed)
Initial Nutrition Assessment  DOCUMENTATION CODES:   Not applicable  INTERVENTION:   Nocturnal tube feeds of Nepro 1.8 @ 3ml/hr x 14hrs (1800-0800)  Free water flushes 45ml q4 hours to maintain tube patency   Regimen provides 2142kcal/day, 96g/day protein and 1019ml/day free water   Rena-vit daily via tube   NUTRITION DIAGNOSIS:   Inadequate oral intake related to chronic illness as evidenced by other (comment) ([name])  GOAL:   Patient will meet greater than or equal to 90% of their needs  MONITOR:   PO intake, Labs, Weight trends, TF tolerance, Skin, I & O's  REASON FOR ASSESSMENT:   Consult Enteral/tube feeding initiation and management  ASSESSMENT:   65 y.o. 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  RD working remotely.  Pt s/p new 18 Fr balloon retention G-J tube placement with tip positioned in the proximal jejunum 7/7  RD familiar with this pt from his recent previous admit. Pt with G-J tube in place since November 2021 (replaced 7/7 secondary to clog). Pt reports that tube was placed r/t weight loss and his inability to eat enough to maintain good nutritional status. Pt  reports that he currently uses Novasource renal at home. Pt is on nocturnal feeds of 71ml/hr x 12-13 hrs. Pt reports that he gets a total of 913ml of tube feeds which provides 1900kcal/da and 88g/day protein. Pt reports that he does eat some food by mouth but reports early satiety and inability to eat more than 25% of meals. Pt reports that he does not tolerate supplements as they make him vomit. Pt reports poor appetite and oral intake in hospital r/t nausea. Pt has been tolerating his tube feeds at home. Per chart, pt appears to have lost from 233lbs to 143lbs from July of 2021-November 2021; this is a 90lb(39%) weight loss which is severe. Pt does appear to have regained 25lbs from November 2022 to June 2022 but does appear to be down 10lbs(6%) over the past month; pt does endorse this weight loss. RD will resume nocturnal tube feeds; will use Nepro as Novasource is not on formulary at [facility]. Pt is at high risk for malnutrition. RD will obtain NFPE at follow-up. Last HD 7/9.   Medications reviewed and include: epoetin, heparin, lactulose, reglan, remeron, cellcept, zofran, protonix, prednisone, bactrim  Labs reviewed: K 3.3(L), BUN 34(H), creat 2.57(H) Wbc- 2.1(L), Hgb 7.2(L), Hct 20.7(L) cbgs- 158, 142, 157 x 24 hrs AIC 6.4(H)- 7/4  NUTRITION - FOCUSED PHYSICAL EXAM: Unable to perform at this time   Diet Order:   Diet Order             Diet Heart Room service appropriate? Yes; Fluid consistency: Thin  Diet effective now                  EDUCATION NEEDS:   Education needs have been  addressed  Skin:  Skin Assessment: Reviewed RN Assessment (ecchymosis)  Last BM:  7/9- type 7  Height:   Ht Readings from Last 1 Encounters:  10/15/20 6\' 1"  (1.854 m)    Weight:   Wt Readings from Last 1 Encounters:  10/17/20 71.6 kg    Ideal Body Weight:  83.6 kg  BMI:  Body mass index is 20.83 kg/m.  Estimated Nutritional Needs:   Kcal:  2200-2500kcal/day  Protein:   110-125g/day  Fluid:  UOP +1L  Alex Distance MS, RD, LDN Please refer to [facility] for RD and/or RD on-call/weekend/after hours pager

## 2020-10-18 ENCOUNTER — Inpatient Hospital Stay: Payer: Medicare Other

## 2020-10-18 LAB — CBC WITH DIFFERENTIAL/PLATELET
Abs Immature Granulocytes: 0.17 10*3/uL — ABNORMAL HIGH (ref 0.00–0.07)
Basophils Absolute: 0 10*3/uL (ref 0.0–0.1)
Basophils Relative: 1 %
Eosinophils Absolute: 0 10*3/uL (ref 0.0–0.5)
Eosinophils Relative: 2 %
HCT: 21.5 % — ABNORMAL LOW (ref 39.0–52.0)
Hemoglobin: 7.6 g/dL — ABNORMAL LOW (ref 13.0–17.0)
Immature Granulocytes: 9 %
Lymphocytes Relative: 24 %
Lymphs Abs: 0.5 10*3/uL — ABNORMAL LOW (ref 0.7–4.0)
MCH: 34.2 pg — ABNORMAL HIGH (ref 26.0–34.0)
MCHC: 35.3 g/dL (ref 30.0–36.0)
MCV: 96.8 fL (ref 80.0–100.0)
Monocytes Absolute: 0.3 10*3/uL (ref 0.1–1.0)
Monocytes Relative: 14 %
Neutro Abs: 1 10*3/uL — ABNORMAL LOW (ref 1.7–7.7)
Neutrophils Relative %: 50 %
Platelets: 132 10*3/uL — ABNORMAL LOW (ref 150–400)
RBC: 2.22 MIL/uL — ABNORMAL LOW (ref 4.22–5.81)
RDW: 15.4 % (ref 11.5–15.5)
Smear Review: NORMAL
WBC: 1.9 10*3/uL — ABNORMAL LOW (ref 4.0–10.5)
nRBC: 0 % (ref 0.0–0.2)

## 2020-10-18 LAB — URINALYSIS, COMPLETE (UACMP) WITH MICROSCOPIC
Bacteria, UA: NONE SEEN
Bilirubin Urine: NEGATIVE
Glucose, UA: NEGATIVE mg/dL
Hgb urine dipstick: NEGATIVE
Ketones, ur: NEGATIVE mg/dL
Leukocytes,Ua: NEGATIVE
Nitrite: NEGATIVE
Protein, ur: NEGATIVE mg/dL
Specific Gravity, Urine: 1.01 (ref 1.005–1.030)
pH: 7 (ref 5.0–8.0)

## 2020-10-18 LAB — TYPE AND SCREEN
ABO/RH(D): O NEG
Antibody Screen: NEGATIVE
Unit division: 0

## 2020-10-18 LAB — BPAM RBC
Blood Product Expiration Date: 202207132359
ISSUE DATE / TIME: 202207100020
Unit Type and Rh: 9500

## 2020-10-18 LAB — BASIC METABOLIC PANEL
Anion gap: 8 (ref 5–15)
BUN: 45 mg/dL — ABNORMAL HIGH (ref 8–23)
CO2: 28 mmol/L (ref 22–32)
Calcium: 8.5 mg/dL — ABNORMAL LOW (ref 8.9–10.3)
Chloride: 99 mmol/L (ref 98–111)
Creatinine, Ser: 2.85 mg/dL — ABNORMAL HIGH (ref 0.61–1.24)
GFR, Estimated: 24 mL/min — ABNORMAL LOW (ref >60)
Glucose, Bld: 188 mg/dL — ABNORMAL HIGH (ref 70–99)
Potassium: 3.9 mmol/L (ref 3.5–5.1)
Sodium: 135 mmol/L (ref 135–145)

## 2020-10-18 LAB — ECHOCARDIOGRAM COMPLETE
Height: 73 in
S' Lateral: 3.11 cm
Weight: 2458.57 oz

## 2020-10-18 LAB — GLUCOSE, CAPILLARY
Glucose-Capillary: 153 mg/dL — ABNORMAL HIGH (ref 70–99)
Glucose-Capillary: 178 mg/dL — ABNORMAL HIGH (ref 70–99)
Glucose-Capillary: 222 mg/dL — ABNORMAL HIGH (ref 70–99)
Glucose-Capillary: 225 mg/dL — ABNORMAL HIGH (ref 70–99)
Glucose-Capillary: 243 mg/dL — ABNORMAL HIGH (ref 70–99)

## 2020-10-18 LAB — CORTISOL: Cortisol, Plasma: 2.7 ug/dL

## 2020-10-18 MED ORDER — METOCLOPRAMIDE HCL 5 MG/ML IJ SOLN
5.0000 mg | Freq: Three times a day (TID) | INTRAMUSCULAR | Status: AC
  Administered 2020-10-18 – 2020-10-19 (×2): 5 mg via INTRAVENOUS
  Filled 2020-10-18 (×2): qty 2

## 2020-10-18 MED ORDER — MEGESTROL ACETATE 20 MG PO TABS
40.0000 mg | ORAL_TABLET | Freq: Every day | ORAL | Status: DC
  Administered 2020-10-18 – 2020-10-20 (×3): 40 mg via ORAL
  Filled 2020-10-18 (×3): qty 2

## 2020-10-18 MED ORDER — INSULIN ASPART 100 UNIT/ML IJ SOLN
0.0000 [IU] | Freq: Three times a day (TID) | INTRAMUSCULAR | Status: DC
  Administered 2020-10-18: 1 [IU] via SUBCUTANEOUS
  Administered 2020-10-18 – 2020-10-20 (×3): 2 [IU] via SUBCUTANEOUS
  Administered 2020-10-20: 1 [IU] via SUBCUTANEOUS
  Filled 2020-10-18 (×5): qty 1

## 2020-10-18 MED ORDER — SODIUM BICARBONATE 650 MG PO TABS
650.0000 mg | ORAL_TABLET | Freq: Once | ORAL | Status: AC
  Administered 2020-10-18: 650 mg
  Filled 2020-10-18: qty 1

## 2020-10-18 MED ORDER — SODIUM CHLORIDE 0.9 % IV SOLN
INTRAVENOUS | Status: DC | PRN
  Administered 2020-10-18 – 2020-10-20 (×3): 250 mL via INTRAVENOUS

## 2020-10-18 MED ORDER — PANCRELIPASE (LIP-PROT-AMYL) 10440-39150 UNITS PO TABS
20880.0000 [IU] | ORAL_TABLET | Freq: Once | ORAL | Status: AC
Start: 2020-10-18 — End: 2020-10-18
  Administered 2020-10-18: 20880 [IU]
  Filled 2020-10-18: qty 2

## 2020-10-18 MED ORDER — MIRTAZAPINE 15 MG PO TABS
15.0000 mg | ORAL_TABLET | Freq: Every day | ORAL | Status: DC
  Administered 2020-10-18 – 2020-10-20 (×3): 15 mg via ORAL
  Filled 2020-10-18 (×3): qty 1

## 2020-10-18 MED ORDER — COSYNTROPIN 0.25 MG IJ SOLR
0.2500 mg | Freq: Once | INTRAMUSCULAR | Status: AC
  Administered 2020-10-19: 0.25 mg via INTRAVENOUS
  Filled 2020-10-18: qty 0.25

## 2020-10-18 MED ORDER — SODIUM CHLORIDE 0.9 % IV SOLN: INTRAVENOUS | Status: AC

## 2020-10-18 MED ORDER — SIMETHICONE 80 MG PO CHEW
160.0000 mg | CHEWABLE_TABLET | Freq: Four times a day (QID) | ORAL | Status: DC
  Administered 2020-10-18 – 2020-10-20 (×8): 160 mg via ORAL
  Filled 2020-10-18 (×16): qty 2

## 2020-10-18 NOTE — Progress Notes (Addendum)
PROGRESS NOTE    Alex Hampton  HBZ:[id number] DOB: 07-09-1955 DOA: 10/15/2020 PCP: Juluis Pitch, MD   Chief Complaint  Patient presents with   Loss of Consciousness    Brief Narrative: 65 year old male with multiple medical problems including ESRD on HD TTS, Liver Cirrhosis s/p Liver Transplant on immunosuppressants, Severe Protein Calorie Malnutrition with G tube in place who presents to the ED with recurrent nausea and vomiting x 3 days followed by 2 syncopal episodes at home while walking to the bathroom. Son reports patient had multiple bowel movements prior to passing out.  There was no seizure like activity witnessed by the son.  [name] cannot recall details of the event.  But he denies having any chest pain or shortness of breath.  7/9 1 unit pRBC - Hb 6.6 to 7.2 g/dL. 7/10 MRI of brain showed no acute abnormality. 7/10 Echo showed no RWMA and EF 45-50%. 7/11 am Cortisol is 2.7 ug/dL  Subjective: [name] looks much better today overall - but remains fatigued. BP is stable. States nausea has resolved. Says his belly feels bloated. He had multiple bowel movements after receiving Lactulose yesterday - we will hold Lactulose. HH is stable at 7.6  Assessment & Plan: Active Problems:   Syncope  Hypotension and Chronic Steroid Use: - AM Cortisol is 2.7 ug/dL - indicating likely adrenal insufficiency. - Check ACTH stimulation test in morning. - If positive increase steroid dosing and consult Endocrine.  Acute Recurrent Syncope: This sounds vasovagal based on story but we need to rule out adrenal insufficiency given chronic use of steroids.  There was no evidence of any seizures.  - 7/9 Echo did not show any RWMA.  EF is 45-50%.  There are no valvular abnormalities. - 7/10 MRI of brain showed no acute abnormality. - Check stim test as above.  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 - Follow up with Cardiology  outpatient.  Bands noted on differential: - This may be related to bone marrow suppression from chronic diseases vs acute infection.  - On Day 1 of broad antibiotics. - Follow up on Blood Cultures.  Abdominal Bloating: - Check Abdominal CT wo contrast. - Start simethicone.  Poor Oral Intake: - Start Megestrol 4 times daily. - Increase home med Remeron to 15 mg nightly. - Encourage increased oral intake.  Nausea and Vomiting, improved: - Change Zofran to PRN. - Continue IV Reglan 5 mg TID x 24 hours.  Monitor renal function. - Continue IV Protonix 40 mg BID. - Give gentle fluids.  Acute Anemia: There is no evidence of any acute bleeding. 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 - S/P 7/9 1 unit pRBC. - HH stable at 7.6/21.5. - Continue to hold Aspirin and Plavix.  ESRD on HD TTS with Right IJV permcath: - Volume status is euvolemic. - Nephrology is following, appreciate.  Acute Metabolic Encephalopathy - uremia vs hepatic encephalopathy, improved: - Hold all sedating meds - Gabapentin, Lexapro.  Restart Remeron as above. - Patient has had a few bowel movements and mental status has improved - hold Lactulose.  May need to be on prophylactic Lactulose outpatient.  Diabetes Mellitus Type 2: Glucose is stable. - Sliding Scale plus POC glucose q6hrs.  Liver Transplant: - FU Hepatology outpatient.  CAD: - Hold DAPT due to acute anemia.    Diet Order             Diet regular Room  service appropriate? Yes; Fluid consistency: Thin  Diet effective now                   Nutrition Problem: Inadequate oral intake Etiology: chronic illness Signs/Symptoms: other (comment) ([name])   Patient's Body mass index is 20.83 kg/m.    DVT prophylaxis: heparin injection 5,000 Units Start: 10/16/20 0145 Code Status:   Code Status: Full Code  Family Communication: plan of care discussed with patient at bedside.  Status is: Inpatient  Remains inpatient appropriate  because:Inpatient level of care appropriate due to severity of illness  Dispo: The patient is from: Home              Anticipated d/c is to: Home Discharge home once symptoms improve - hopefully 1-2 days.              Patient currently is not medically stable to d/c.   Difficult to place patient No     Unresulted Labs (From admission, onward)     Start     Ordered   10/18/20 0836  Urine Culture  Once,   R        10/18/20 0835   10/18/20 0500  CULTURE, BLOOD (ROUTINE X 2) w Reflex to ID Panel  BLOOD CULTURE X 2,   TIMED      10/17/20 2309   10/17/20 6659  Basic metabolic panel  Daily,   R     Question:  Specimen collection method  Answer:  Lab=Lab collect   10/16/20 2128   10/17/20 0500  CBC with Differential/Platelet  Daily,   R     Question:  Specimen collection method  Answer:  Lab=Lab collect   10/16/20 2128   10/16/20 0743  Occult blood card to lab, stool  Once,   R        10/16/20 0745            Medications reviewed:  Scheduled Meds:  atorvastatin  40 mg Oral QHS   Chlorhexidine Gluconate Cloth  6 each Topical Daily   [START ON 10/19/2020] epoetin (EPOGEN/PROCRIT) injection  10,000 Units Intravenous Q T,Th,Sa-HD   feeding supplement (NEPRO CARB STEADY)  1,190 mL Per Tube Q24H   free water  30 mL Per Tube Q4H   heparin  5,000 Units Subcutaneous Q8H   insulin aspart  0-6 Units Subcutaneous TID WC   megestrol  40 mg Oral Daily   metoCLOPramide (REGLAN) injection  5 mg Intravenous Q8H   mirtazapine  15 mg Oral QHS   multivitamin  1 tablet Per Tube QHS   mycophenolate  1,000 mg Oral BID   pantoprazole (PROTONIX) IV  40 mg Intravenous Q12H   predniSONE  17.5 mg Oral Daily   simethicone  160 mg Oral QID   sulfamethoxazole-trimethoprim  1 tablet Oral Q M,W,F   tacrolimus  7 mg Oral BID   valGANciclovir  450 mg Oral Once per day on Mon Thu   Continuous Infusions:  sodium chloride 50 mL/hr at 10/18/20 1110   ceFEPime (MAXIPIME) IV     promethazine (PHENERGAN)  injection (IM or IVPB)     [START ON 10/19/2020] vancomycin      Consultants:see note  Procedures:see note  Antimicrobials: Anti-infectives (From admission, onward)    Start     Dose/Rate Route Frequency Ordered Stop   10/19/20 1200  vancomycin (VANCOREADY) IVPB 750 mg/150 mL        750 mg 150 mL/hr over 60 Minutes Intravenous  Every T-Th-Sa (Hemodialysis) 10/17/20 2343     10/18/20 1000  sulfamethoxazole-trimethoprim (BACTRIM DS) 800-160 MG per tablet 1 tablet        1 tablet Oral Every M-W-F 10/15/20 2147     10/18/20 0900  valGANciclovir (VALCYTE) 450 MG tablet TABS 450 mg       Note to Pharmacy: (Monday and Thursday) with largest meal of the day.     450 mg Oral Once per day on Mon Thu 10/15/20 2147     10/18/20 0200  vancomycin (VANCOREADY) IVPB 1250 mg/250 mL        1,250 mg 166.7 mL/hr over 90 Minutes Intravenous  Once 10/17/20 2343 10/18/20 0303   10/18/20 0100  ceFEPIme (MAXIPIME) 2 g in sodium chloride 0.9 % 100 mL IVPB        2 g 200 mL/hr over 30 Minutes Intravenous  Once 10/17/20 2343 10/18/20 0122   10/17/20 2200  ceFEPIme (MAXIPIME) 1 g in sodium chloride 0.9 % 100 mL IVPB        1 g 200 mL/hr over 30 Minutes Intravenous Every 24 hours 10/17/20 2343        Culture/Microbiology No results found for: SDES, SPECREQUEST, CULT, REPTSTATUS  Other culture-see note  Objective: Vitals: Today's Vitals   10/18/20 1205 10/18/20 1206 10/18/20 1209 10/18/20 1548  BP: (!) 151/84 (!) 144/89 (!) 148/89 (!) 144/88  Pulse: 84 90 94 71  Resp: 16 16 16 16   Temp:    98.6 F (37 C)  TempSrc:      SpO2: 100% 100% 100% 100%  Weight:      Height:      PainSc:        Intake/Output Summary (Last 24 hours) at 10/18/2020 1722 Last data filed at 10/18/2020 1013 Gross per 24 hour  Intake 1185 ml  Output 500 ml  Net 685 ml    Filed Weights   10/16/20 0500 10/17/20 0412 10/18/20 0253  Weight: 69.7 kg 71.6 kg 71.6 kg   Weight change: 0 kg  Intake/Output from previous  day: 07/10 0701 - 07/11 0700 In: 1185 [NG/GT:935; IV Piggyback:250] Out: 500 [Urine:500] Intake/Output this shift: No intake/output data recorded. Filed Weights   10/16/20 0500 10/17/20 0412 10/18/20 0253  Weight: 69.7 kg 71.6 kg 71.6 kg    Examination: General exam: chronically ill appearing, occasionally drowsy, RIJ permcath HEENT: NCAT, PERRL, no icterus Respiratory system: CTAB no WRR Cardiovascular system: Did not appreciate a murmur, regular, No JVD. Gastrointestinal system: G tube in place, Abdomen soft, NT,ND, BS+. Nervous System: 4/5 strength on the left, no other focal deficits, cranial nerves intact. Extremities: No edema, distal peripheral pulses palpable.  Skin: scattered bruises on abdomen MSK: Physical Deconditioning  Data Reviewed: I have personally reviewed following labs and imaging studies CBC: Recent Labs  Lab 10/12/20 0447 10/13/20 0424 10/14/20 0444 10/15/20 2024 10/16/20 0539 10/16/20 1009 10/17/20 0611 10/18/20 0626  WBC 2.7* 2.7* 2.1* 2.0* 2.5*  --  2.1* 1.9*  NEUTROABS 1.8 1.4*  --  1.1*  --   --  1.0* 1.0*  HGB 8.5* 7.7* 7.1* 7.8* 6.6* 6.6* 7.2* 7.6*  HCT 24.4* 21.9* 20.2* 22.5* 19.0* 19.0* 20.7* 21.5*  MCV 100.4* 97.3 99.0 99.6 96.9  --  98.6 96.8  PLT 161 142* 117* 123* 116*  --  112* 132*    Basic Metabolic Panel: Recent Labs  Lab 10/12/20 0447 10/13/20 0424 10/14/20 0444 10/15/20 2024 10/16/20 0539 10/16/20 1009 10/17/20 0611 10/18/20 0626  NA 131*   < >  134* 132* 131* 133* 136 135  K 5.9*   < > 4.0 4.1 4.1 4.1 3.3* 3.9  CL 97*   < > 95* 92* 94* 96* 99 99  CO2 26   < > 29 27 29 28 31 28   GLUCOSE 222*   < > 117* 222* 179* 175* 167* 188*  BUN 75*   < > 38* 69* 69* 70* 34* 45*  CREATININE 2.55*   < > 2.66* 4.04* 3.90* 4.14* 2.57* 2.85*  CALCIUM 9.1   < > 8.7* 8.7* 8.6* 8.7* 8.4* 8.5*  MG 1.8  --  1.6* 1.9  --   --   --   --   PHOS 4.1  --   --   --   --   --   --   --    < > = values in this interval not displayed.     GFR: Estimated Creatinine Clearance: 25.8 mL/min (A) (by C-G formula based on SCr of 2.85 mg/dL (H)). Liver Function Tests: Recent Labs  Lab 10/15/20 2024  AST 22  ALT 16  ALKPHOS 60  BILITOT 0.6  PROT 6.2*  ALBUMIN 3.4*     No results for input(s): AMMONIA in the last 168 hours.  Coagulation Profile: No results for input(s): INR, PROTIME in the last 168 hours.   CBG: Recent Labs  Lab 10/17/20 2141 10/18/20 0046 10/18/20 0806 10/18/20 1201 10/18/20 1706  GLUCAP 239* 225* 153* 222* 243*     Anemia Panel: Recent Labs    10/16/20 4650  RETICCTPCT 0.9     Recent Results (from the past 240 hour(s))  Resp Panel by RT-PCR (Flu A&B, Covid) Nasopharyngeal Swab     Status: None   Collection Time: 10/10/20  2:45 PM   Specimen: Nasopharyngeal Swab; Nasopharyngeal(NP) swabs in vial transport medium  Result Value Ref Range Status   SARS Coronavirus 2 by RT PCR NEGATIVE NEGATIVE Final    Comment: (NOTE) SARS-CoV-2 target nucleic acids are NOT DETECTED.  The SARS-CoV-2 RNA is generally detectable in upper respiratory specimens during the acute phase of infection. The lowest concentration of SARS-CoV-2 viral copies this assay can detect is 138 copies/mL. A negative result does not preclude SARS-Cov-2 infection and should not be used as the sole basis for treatment or other patient management decisions. A negative result may occur with  improper specimen collection/handling, submission of specimen other than nasopharyngeal swab, presence of viral mutation(s) within the areas targeted by this assay, and inadequate number of viral copies(<138 copies/mL). A negative result must be combined with clinical observations, patient history, and epidemiological information. The expected result is Negative.  Fact Sheet for Patients:  EntrepreneurPulse.com.au  Fact Sheet for Healthcare Providers:  IncredibleEmployment.be  This test is no  t yet approved or cleared by the [place] FDA and  has been authorized for detection and/or diagnosis of SARS-CoV-2 by FDA under an Emergency Use Authorization (EUA). This EUA will remain  in effect (meaning this test can be used) for the duration of the COVID-19 declaration under Section 564(b)(1) of the Act, 21 U.S.C.section 360bbb-3(b)(1), unless the authorization is terminated  or revoked sooner.       Influenza A by PCR NEGATIVE NEGATIVE Final   Influenza B by PCR NEGATIVE NEGATIVE Final    Comment: (NOTE) The Xpert Xpress SARS-CoV-2/FLU/RSV plus assay is intended as an aid in the diagnosis of influenza from Nasopharyngeal swab specimens and should not be used as a sole basis for treatment. Nasal washings  and aspirates are unacceptable for Xpert Xpress SARS-CoV-2/FLU/RSV testing.  Fact Sheet for Patients: EntrepreneurPulse.com.au  Fact Sheet for Healthcare Providers: IncredibleEmployment.be  This test is not yet approved or cleared by the [place] FDA and has been authorized for detection and/or diagnosis of SARS-CoV-2 by FDA under an Emergency Use Authorization (EUA). This EUA will remain in effect (meaning this test can be used) for the duration of the COVID-19 declaration under Section 564(b)(1) of the Act, 21 U.S.C. section 360bbb-3(b)(1), unless the authorization is terminated or revoked.  Performed at [facility], [address] [place], [place] [address]   Resp Panel by RT-PCR (Flu A&B, Covid) Nasopharyngeal Swab     Status: None   Collection Time: 10/15/20  8:24 PM   Specimen: Nasopharyngeal Swab; Nasopharyngeal(NP) swabs in vial transport medium  Result Value Ref Range Status   SARS Coronavirus 2 by RT PCR NEGATIVE NEGATIVE Final    Comment: (NOTE) SARS-CoV-2 target nucleic acids are NOT DETECTED.  The SARS-CoV-2 RNA is generally detectable in upper respiratory specimens during the acute phase of  infection. The lowest concentration of SARS-CoV-2 viral copies this assay can detect is 138 copies/mL. A negative result does not preclude SARS-Cov-2 infection and should not be used as the sole basis for treatment or other patient management decisions. A negative result may occur with  improper specimen collection/handling, submission of specimen other than nasopharyngeal swab, presence of viral mutation(s) within the areas targeted by this assay, and inadequate number of viral copies(<138 copies/mL). A negative result must be combined with clinical observations, patient history, and epidemiological information. The expected result is Negative.  Fact Sheet for Patients:  EntrepreneurPulse.com.au  Fact Sheet for Healthcare Providers:  IncredibleEmployment.be  This test is no t yet approved or cleared by the [place] FDA and  has been authorized for detection and/or diagnosis of SARS-CoV-2 by FDA under an Emergency Use Authorization (EUA). This EUA will remain  in effect (meaning this test can be used) for the duration of the COVID-19 declaration under Section 564(b)(1) of the Act, 21 U.S.C.section 360bbb-3(b)(1), unless the authorization is terminated  or revoked sooner.       Influenza A by PCR NEGATIVE NEGATIVE Final   Influenza B by PCR NEGATIVE NEGATIVE Final    Comment: (NOTE) The Xpert Xpress SARS-CoV-2/FLU/RSV plus assay is intended as an aid in the diagnosis of influenza from Nasopharyngeal swab specimens and should not be used as a sole basis for treatment. Nasal washings and aspirates are unacceptable for Xpert Xpress SARS-CoV-2/FLU/RSV testing.  Fact Sheet for Patients: EntrepreneurPulse.com.au  Fact Sheet for Healthcare Providers: IncredibleEmployment.be  This test is not yet approved or cleared by the [place] FDA and has been authorized for detection and/or diagnosis of SARS-CoV-2  by FDA under an Emergency Use Authorization (EUA). This EUA will remain in effect (meaning this test can be used) for the duration of the COVID-19 declaration under Section 564(b)(1) of the Act, 21 U.S.C. section 360bbb-3(b)(1), unless the authorization is terminated or revoked.  Performed at [facility], [address], [place], [place] [address]       Radiology Studies: MR BRAIN WO CONTRAST  Result Date: 10/17/2020 CLINICAL DATA:  Neuro deficit, acute stroke suspected.  Dizziness. EXAM: MRI HEAD WITHOUT CONTRAST TECHNIQUE: Multiplanar, multiecho pulse sequences of the brain and surrounding structures were obtained without intravenous contrast. COMPARISON:  MRI October 17, 2019. FINDINGS: Brain: No acute infarction, hemorrhage, hydrocephalus, extra-axial collection or mass lesion. Mild to moderate scattered small T2/FLAIR  hyperintensities within the white matter, which are nonspecific but potentially related to chronic microvascular ischemic disease. Vascular: Major arterial flow voids are maintained at the skull base. Skull and upper cervical spine: Normal marrow signal. Sinuses/Orbits: Visualized sinuses are clear.  Unremarkable orbits. Other: No mastoid effusions. IMPRESSION: 1. No evidence of acute intracranial abnormality. 2. Mild to moderate chronic microvascular ischemic disease. Electronically Signed   By: Margaretha Sheffield MD   On: 10/17/2020 12:26     LOS: 2 days   George Hugh, MD [facility]  10/18/2020, 5:22 PM

## 2020-10-18 NOTE — Progress Notes (Signed)
[facility]  ROUNDING NOTE   Subjective:   Continues to complain of nausea Complains of left kidney, intermittent shooting pain for 2 days  States he feels better today, but fatigued Voiding clear yellow urine  Objective:  Vital signs in last 24 hours:  Temp:  [97.5 F (36.4 C)-98.6 F (37 C)] 98.3 F (36.8 C) (07/11 0803) Pulse Rate:  [54-93] 71 (07/11 0803) Resp:  [16-19] 17 (07/11 0803) BP: (99-153)/(61-77) 143/75 (07/11 0803) SpO2:  [98 %-100 %] 100 % (07/11 0803) Weight:  [71.6 kg] 71.6 kg (07/11 0253)  Weight change: 0 kg Filed Weights   10/16/20 0500 10/17/20 0412 10/18/20 0253  Weight: 69.7 kg 71.6 kg 71.6 kg    Intake/Output: I/O last 3 completed shifts: In: 1894.3 [Blood:379.3; NG/GT:1265; IV Piggyback:250] Out: 750 [Urine:750]   Intake/Output this shift:  No intake/output data recorded.  Physical Exam: General: NAD, laying in bed  Head: Normocephalic, atraumatic. Dry oral mucosal membranes  Eyes: Anicteric  Lungs:  Clear to auscultation, normal effort  Heart: Regular rate and rhythm  Abdomen:  Soft, nontender, GJ tube  Extremities: no peripheral edema.  Neurologic: Nonfocal, moving all four extremities  Skin: No lesions  Access: RIJ permcath    Basic Metabolic Panel: Recent Labs  Lab 10/11/20 1447 10/12/20 0447 10/13/20 0424 10/14/20 0444 10/15/20 2024 10/16/20 0539 10/16/20 1009 10/17/20 0611 10/18/20 0626  NA  --  131*   < > 134* 132* 131* 133* 136 135  K  --  5.9*   < > 4.0 4.1 4.1 4.1 3.3* 3.9  CL  --  97*   < > 95* 92* 94* 96* 99 99  CO2  --  26   < > 29 27 29 28 31 28   GLUCOSE  --  222*   < > 117* 222* 179* 175* 167* 188*  BUN  --  75*   < > 38* 69* 69* 70* 34* 45*  CREATININE  --  2.55*   < > 2.66* 4.04* 3.90* 4.14* 2.57* 2.85*  CALCIUM  --  9.1   < > 8.7* 8.7* 8.6* 8.7* 8.4* 8.5*  MG  --  1.8  --  1.6* 1.9  --   --   --   --   PHOS 5.4* 4.1  --   --   --   --   --   --   --    < > = values in this interval not  displayed.     Liver Function Tests: Recent Labs  Lab 10/15/20 2024  AST 22  ALT 16  ALKPHOS 60  BILITOT 0.6  PROT 6.2*  ALBUMIN 3.4*    No results for input(s): LIPASE, AMYLASE in the last 168 hours. No results for input(s): AMMONIA in the last 168 hours.   CBC: Recent Labs  Lab 10/12/20 0447 10/13/20 0424 10/14/20 0444 10/15/20 2024 10/16/20 0539 10/16/20 1009 10/17/20 0611 10/18/20 0626  WBC 2.7* 2.7* 2.1* 2.0* 2.5*  --  2.1* 1.9*  NEUTROABS 1.8 1.4*  --  1.1*  --   --  1.0* 1.0*  HGB 8.5* 7.7* 7.1* 7.8* 6.6* 6.6* 7.2* 7.6*  HCT 24.4* 21.9* 20.2* 22.5* 19.0* 19.0* 20.7* 21.5*  MCV 100.4* 97.3 99.0 99.6 96.9  --  98.6 96.8  PLT 161 142* 117* 123* 116*  --  112* 132*     Cardiac Enzymes: No results for input(s): CKTOTAL, CKMB, CKMBINDEX, TROPONINI in the last 168 hours.  BNP: Invalid input(s): POCBNP  CBG: Recent Labs  Lab 10/17/20 1120 10/17/20 1637 10/17/20 2141 10/18/20 0046 10/18/20 0806  GLUCAP 142* 169* 239* 225* 153*     Microbiology: Results for orders placed or performed during the hospital encounter of 10/15/20  Resp Panel by RT-PCR (Flu A&B, Covid) Nasopharyngeal Swab     Status: None   Collection Time: 10/15/20  8:24 PM   Specimen: Nasopharyngeal Swab; Nasopharyngeal(NP) swabs in vial transport medium  Result Value Ref Range Status   SARS Coronavirus 2 by RT PCR NEGATIVE NEGATIVE Final    Comment: (NOTE) SARS-CoV-2 target nucleic acids are NOT DETECTED.  The SARS-CoV-2 RNA is generally detectable in upper respiratory specimens during the acute phase of infection. The lowest concentration of SARS-CoV-2 viral copies this assay can detect is 138 copies/mL. A negative result does not preclude SARS-Cov-2 infection and should not be used as the sole basis for treatment or other patient management decisions. A negative result may occur with  improper specimen collection/handling, submission of specimen other than nasopharyngeal swab,  presence of viral mutation(s) within the areas targeted by this assay, and inadequate number of viral copies(<138 copies/mL). A negative result must be combined with clinical observations, patient history, and epidemiological information. The expected result is Negative.  Fact Sheet for Patients:  EntrepreneurPulse.com.au  Fact Sheet for Healthcare Providers:  IncredibleEmployment.be  This test is no t yet approved or cleared by the [place] FDA and  has been authorized for detection and/or diagnosis of SARS-CoV-2 by FDA under an Emergency Use Authorization (EUA). This EUA will remain  in effect (meaning this test can be used) for the duration of the COVID-19 declaration under Section 564(b)(1) of the Act, 21 U.S.C.section 360bbb-3(b)(1), unless the authorization is terminated  or revoked sooner.       Influenza A by PCR NEGATIVE NEGATIVE Final   Influenza B by PCR NEGATIVE NEGATIVE Final    Comment: (NOTE) The Xpert Xpress SARS-CoV-2/FLU/RSV plus assay is intended as an aid in the diagnosis of influenza from Nasopharyngeal swab specimens and should not be used as a sole basis for treatment. Nasal washings and aspirates are unacceptable for Xpert Xpress SARS-CoV-2/FLU/RSV testing.  Fact Sheet for Patients: EntrepreneurPulse.com.au  Fact Sheet for Healthcare Providers: IncredibleEmployment.be  This test is not yet approved or cleared by the [place] FDA and has been authorized for detection and/or diagnosis of SARS-CoV-2 by FDA under an Emergency Use Authorization (EUA). This EUA will remain in effect (meaning this test can be used) for the duration of the COVID-19 declaration under Section 564(b)(1) of the Act, 21 U.S.C. section 360bbb-3(b)(1), unless the authorization is terminated or revoked.  Performed at [facility], [address] [place], [place] [address]      Coagulation Studies: No results for input(s): LABPROT, INR in the last 72 hours.  Urinalysis: No results for input(s): COLORURINE, LABSPEC, PHURINE, GLUCOSEU, HGBUR, BILIRUBINUR, KETONESUR, PROTEINUR, UROBILINOGEN, NITRITE, LEUKOCYTESUR in the last 72 hours.  Invalid input(s): APPERANCEUR    Imaging: DG Abd 1 View  Result Date: 10/16/2020 CLINICAL DATA:  Nausea. EXAM: ABDOMEN - 1 VIEW COMPARISON:  Plain film of the abdomen dated 10/14/2020. FINDINGS: No dilated large or small bowel loops are identified. Gastrojejunal feeding tube in place. No evidence of renal or ureteral calculi. No evidence of abnormal fluid collection. No acute appearing osseous abnormality. IMPRESSION: Nonobstructive bowel gas pattern. Gastrojejunal feeding tube in place. Electronically Signed   By: Franki Cabot M.D.   On: 10/16/2020 12:11   MR BRAIN WO CONTRAST  Result Date: 10/17/2020 CLINICAL DATA:  Neuro deficit, acute stroke suspected.  Dizziness. EXAM: MRI HEAD WITHOUT CONTRAST TECHNIQUE: Multiplanar, multiecho pulse sequences of the brain and surrounding structures were obtained without intravenous contrast. COMPARISON:  MRI October 17, 2019. FINDINGS: Brain: No acute infarction, hemorrhage, hydrocephalus, extra-axial collection or mass lesion. Mild to moderate scattered small T2/FLAIR hyperintensities within the white matter, which are nonspecific but potentially related to chronic microvascular ischemic disease. Vascular: Major arterial flow voids are maintained at the skull base. Skull and upper cervical spine: Normal marrow signal. Sinuses/Orbits: Visualized sinuses are clear.  Unremarkable orbits. Other: No mastoid effusions. IMPRESSION: 1. No evidence of acute intracranial abnormality. 2. Mild to moderate chronic microvascular ischemic disease. Electronically Signed   By: Margaretha Sheffield MD   On: 10/17/2020 12:26     Medications:    ceFEPime (MAXIPIME) IV     promethazine (PHENERGAN) injection (IM or IVPB)      [START ON 10/19/2020] vancomycin      atorvastatin  40 mg Oral QHS   Chlorhexidine Gluconate Cloth  6 each Topical Daily   [START ON 10/19/2020] epoetin (EPOGEN/PROCRIT) injection  10,000 Units Intravenous Q T,Th,Sa-HD   feeding supplement (NEPRO CARB STEADY)  1,190 mL Per Tube Q24H   free water  30 mL Per Tube Q4H   heparin  5,000 Units Subcutaneous Q8H   insulin aspart  0-6 Units Subcutaneous TID WC   lactulose  20 g Oral BID   mirtazapine  7.5 mg Oral QHS   multivitamin  1 tablet Per Tube QHS   mycophenolate  1,000 mg Oral BID   ondansetron (ZOFRAN) IV  4 mg Intravenous Q8H   pantoprazole (PROTONIX) IV  40 mg Intravenous Q12H   predniSONE  17.5 mg Oral Daily   sulfamethoxazole-trimethoprim  1 tablet Oral Q M,W,F   tacrolimus  7 mg Oral BID   valGANciclovir  450 mg Oral Once per day on Mon Thu   acetaminophen **OR** acetaminophen, hydrOXYzine, nitroGLYCERIN, ondansetron **OR** ondansetron (ZOFRAN) IV, promethazine (PHENERGAN) injection (IM or IVPB), senna-docusate, traZODone  Assessment/ Plan:  [name] is a 65 y.o. white male with end stager renal disease, status post liver transplant, hyperlipidemia, hypertension, coronary artery disease, depression who is re-admitted to [facility] on 10/15/2020 for Syncope and collapse [R55] Syncope [R55]  [facility] TTS [address] RIJ permcath 69kg  End Stage renal disease: Continue TTS schedule. Next treatment scheduled for Tuesday. Ordering UA and US abdomen to eval pain   Hypotension: 143/75 - at goal.  Holding metoprolol.    Anemia of chronic kidney disease: hemoglobin 7.6  - PRBC transfusion on 7/9.  -  EPO with HD treatments  Secondary Hyperparathyroidism: calcium and phosphorus at goal. Not currently on binders.     LOS: 2 Alex Hampton 7/11/20229:35 AM

## 2020-10-19 DIAGNOSIS — D649 Anemia, unspecified: Secondary | ICD-10-CM

## 2020-10-19 DIAGNOSIS — I2510 Atherosclerotic heart disease of native coronary artery without angina pectoris: Secondary | ICD-10-CM

## 2020-10-19 LAB — CBC WITH DIFFERENTIAL/PLATELET
Abs Immature Granulocytes: 0.07 10*3/uL (ref 0.00–0.07)
Basophils Absolute: 0 10*3/uL (ref 0.0–0.1)
Basophils Relative: 1 %
Eosinophils Absolute: 0 10*3/uL (ref 0.0–0.5)
Eosinophils Relative: 2 %
HCT: 20.8 % — ABNORMAL LOW (ref 39.0–52.0)
Hemoglobin: 7.3 g/dL — ABNORMAL LOW (ref 13.0–17.0)
Immature Granulocytes: 4 %
Lymphocytes Relative: 35 %
Lymphs Abs: 0.6 10*3/uL — ABNORMAL LOW (ref 0.7–4.0)
MCH: 33.5 pg (ref 26.0–34.0)
MCHC: 35.1 g/dL (ref 30.0–36.0)
MCV: 95.4 fL (ref 80.0–100.0)
Monocytes Absolute: 0.3 10*3/uL (ref 0.1–1.0)
Monocytes Relative: 15 %
Neutro Abs: 0.7 10*3/uL — ABNORMAL LOW (ref 1.7–7.7)
Neutrophils Relative %: 43 %
Platelets: 125 10*3/uL — ABNORMAL LOW (ref 150–400)
RBC: 2.18 MIL/uL — ABNORMAL LOW (ref 4.22–5.81)
RDW: 14.9 % (ref 11.5–15.5)
Smear Review: NORMAL
WBC: 1.8 10*3/uL — ABNORMAL LOW (ref 4.0–10.5)
nRBC: 0 % (ref 0.0–0.2)

## 2020-10-19 LAB — BASIC METABOLIC PANEL
Anion gap: 6 (ref 5–15)
BUN: 42 mg/dL — ABNORMAL HIGH (ref 8–23)
CO2: 27 mmol/L (ref 22–32)
Calcium: 8.9 mg/dL (ref 8.9–10.3)
Chloride: 103 mmol/L (ref 98–111)
Creatinine, Ser: 2.73 mg/dL — ABNORMAL HIGH (ref 0.61–1.24)
GFR, Estimated: 25 mL/min — ABNORMAL LOW (ref >60)
Glucose, Bld: 118 mg/dL — ABNORMAL HIGH (ref 70–99)
Potassium: 3.8 mmol/L (ref 3.5–5.1)
Sodium: 136 mmol/L (ref 135–145)

## 2020-10-19 LAB — HEPATIC FUNCTION PANEL
ALT: 13 U/L (ref 0–44)
AST: 14 U/L — ABNORMAL LOW (ref 15–41)
Albumin: 3 g/dL — ABNORMAL LOW (ref 3.5–5.0)
Alkaline Phosphatase: 45 U/L (ref 38–126)
Bilirubin, Direct: <0.1 mg/dL (ref 0.0–0.2)
Total Bilirubin: 0.7 mg/dL (ref 0.3–1.2)
Total Protein: 5.3 g/dL — ABNORMAL LOW (ref 6.5–8.1)

## 2020-10-19 LAB — ACTH STIMULATION, 3 TIME POINTS
Cortisol, 30 Min: 7 ug/dL
Cortisol, 60 Min: 10.2 ug/dL
Cortisol, Base: 2.3 ug/dL

## 2020-10-19 LAB — URINE CULTURE

## 2020-10-19 LAB — CORTISOL: Cortisol, Plasma: 2.3 ug/dL

## 2020-10-19 LAB — GLUCOSE, CAPILLARY
Glucose-Capillary: 123 mg/dL — ABNORMAL HIGH (ref 70–99)
Glucose-Capillary: 144 mg/dL — ABNORMAL HIGH (ref 70–99)
Glucose-Capillary: 128 mg/dL — ABNORMAL HIGH (ref 70–99)
Glucose-Capillary: 184 mg/dL — ABNORMAL HIGH (ref 70–99)

## 2020-10-19 MED ORDER — HEPARIN SODIUM (PORCINE) 1000 UNIT/ML IJ SOLN
3200.0000 [IU] | Freq: Once | INTRAMUSCULAR | Status: AC
  Administered 2020-10-19: 3200 [IU]

## 2020-10-19 MED ORDER — HEPARIN SODIUM (PORCINE) 1000 UNIT/ML IJ SOLN
INTRAMUSCULAR | Status: AC
  Filled 2020-10-19: qty 1

## 2020-10-19 MED ORDER — HEPARIN SODIUM (PORCINE) 1000 UNIT/ML IJ SOLN
2000.0000 [IU] | Freq: Once | INTRAMUSCULAR | Status: AC
  Administered 2020-10-19: 2000 [IU] via INTRAVENOUS

## 2020-10-19 NOTE — Progress Notes (Signed)
This patient was dialyzed for 3 hours with a net UF of 1L as ordered. Patient tolerated his tx well, denied complaints and vital signs remained stable during and post HD.

## 2020-10-19 NOTE — Progress Notes (Signed)
Lab results today reveal serum potassium of 3.8. Dr. Juleen China made aware, HD bath to be changed to 3K bath as per verbal from MD

## 2020-10-19 NOTE — Progress Notes (Signed)
Patient restarting hemodialysis last admission, is set up at [place] ([place] TTS 11:15am. Patient's first appointment was Saturday, however missed due to hospital admission. Patient is aware that he can start any TTS day upon discharge. Clinic is aware of admission and is holding chair. Amber AA at clinic stated that she is keeping in contact with patient about possible discharge date. Patient stated no dialysis concerns at this time. Please contact me with dialysis placement concerns.  Alex Hampton Dialysis Coordinator [phone]

## 2020-10-19 NOTE — Progress Notes (Signed)
PROGRESS NOTE    LUM STILLINGER  MHD:[id number] DOB: October 01, 1955 DOA: 10/15/2020 PCP: Juluis Pitch, MD   Chief Complaint  Patient presents with   Loss of Consciousness    Brief Narrative: 65 year old male with multiple medical problems including ESRD on HD TTS, Liver Cirrhosis s/p Liver Transplant on immunosuppressants, Severe Protein Calorie Malnutrition with G tube in place who presents to the ED with recurrent nausea and vomiting x 3 days followed by 2 syncopal episodes at home while walking to the bathroom. Son reports patient had multiple bowel movements prior to passing out.  There was no seizure like activity witnessed by the son.  Mr. Teo cannot recall details of the event.  But he denies having any chest pain or shortness of breath.  7/9 1 unit pRBC - Hb 6.6 to 7.2 g/dL. 7/10 MRI of brain showed no acute abnormality. 7/10 Echo showed no RWMA and EF 45-50%. 7/11 am Cortisol is 2.7 ug/dL 7/12- PEG tube clogged today. CT scan done. Notified Dr. Vicente Males [facility] who will come and fix the PEG  Subjective: Wondering about his PEG this a.m.  No nausea or vomiting.  Assessment & Plan: Active Problems:   Syncope  Hypotension and Chronic Steroid Use: - AM Cortisol is 2.7 ug/dL - indicating likely adrenal insufficiency. -7/12 ACTH stimulation test was ordered pending  If positive may consider increasing steroid dose and consult endocrine  (if one available )  Acute Recurrent Syncope: This sounds vasovagal based on story but we need to rule out adrenal insufficiency given chronic use of steroids.  There was no evidence of any seizures.  Echo with rno RWMA. EF 45 to 50%  MRI of brain showed no acute abnormality  F/u with cardiology as outpt   Abdominal Bloating: CT ordered- see report...>nothing acute  Was given simethicone   Poor Oral Intake: Started on Megace His home Remeron dose was increased to 50 mg nightly PEG clogged and to be seen by GI   Nausea and Vomiting, improved: -  Change Zofran to PRN. - Continue IV Reglan 5 mg TID x 24 hours.  Monitor renal function. - Continue IV Protonix 40 mg BID. - Give gentle fluids.  Acute Anemia: There is no evidence of any acute bleeding. 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 - S/P 7/9 1 unit pRBC. Monitor h/h - Continue to hold Aspirin and Plavix.  ESRD on HD TTS with Right IJV permcath: - Volume status is euvolemic. - Nephrology is following, appreciate.  Acute Metabolic Encephalopathy - uremia vs hepatic encephalopathy, improved: - Hold all sedating meds - Gabapentin, Lexapro.  Restart Remeron as above. - Patient has had a few bowel movements and mental status has improved - hold Lactulose.  May need to be on prophylactic Lactulose outpatient.  Diabetes Mellitus Type 2: Glucose is stable. - Sliding Scale plus POC glucose q6hrs.  Liver Transplant: - FU Hepatology outpatient.  CAD: - Hold DAPT due to acute anemia.    Diet Order             Diet regular Room service appropriate? Yes; Fluid consistency: Thin  Diet effective now                   Nutrition Problem: Inadequate oral intake Etiology: chronic illness Signs/Symptoms: other (comment) ([name])   Patient's Body mass index is 21.84 kg/m.    DVT prophylaxis: heparin injection 5,000 Units Start: 10/16/20 0145 Code Status:   Code Status: Full Code  Family Communication:none at bedside  Status is: Inpatient  Remains inpatient appropriate because:Inpatient level of care appropriate due to severity of illness  Dispo: The patient is from: Home              Anticipated d/c is to: Home Discharge home once symptoms improve - hopefully 1-2 days.PEG clogged ,  GI to fix              Patient currently is not medically stable to d/c.   Difficult to place patient No     Unresulted Labs (From admission, onward)     Start     Ordered   10/19/20 1047  Hepatitis B surface antibody  Once,   R       Question:  Specimen collection method   Answer:  Lab=Lab collect   10/19/20 1046   10/19/20 1046  Hepatitis B DNA, ultraquantitative, PCR  Once,   R       Question:  Specimen collection method  Answer:  Lab=Lab collect   10/19/20 1046   10/16/20 0743  Occult blood card to lab, stool  Once,   R        10/16/20 0745            Medications reviewed:  Scheduled Meds:  atorvastatin  40 mg Oral QHS   Chlorhexidine Gluconate Cloth  6 each Topical Daily   epoetin (EPOGEN/PROCRIT) injection  10,000 Units Intravenous Q T,Th,Sa-HD   feeding supplement (NEPRO CARB STEADY)  1,190 mL Per Tube Q24H   free water  30 mL Per Tube Q4H   heparin  5,000 Units Subcutaneous Q8H   insulin aspart  0-6 Units Subcutaneous TID WC   megestrol  40 mg Oral Daily   mirtazapine  15 mg Oral QHS   multivitamin  1 tablet Per Tube QHS   mycophenolate  1,000 mg Oral BID   pantoprazole (PROTONIX) IV  40 mg Intravenous Q12H   predniSONE  17.5 mg Oral Daily   simethicone  160 mg Oral QID   sulfamethoxazole-trimethoprim  1 tablet Oral Q M,W,F   tacrolimus  7 mg Oral BID   valGANciclovir  450 mg Oral Once per day on Mon Thu   Continuous Infusions:  sodium chloride 250 mL (10/18/20 2307)   ceFEPime (MAXIPIME) IV 1 g (10/18/20 2320)   promethazine (PHENERGAN) injection (IM or IVPB)     vancomycin      Consultants:see note  Procedures:see note  Antimicrobials: Anti-infectives (From admission, onward)    Start     Dose/Rate Route Frequency Ordered Stop   10/19/20 1200  vancomycin (VANCOREADY) IVPB 750 mg/150 mL        750 mg 150 mL/hr over 60 Minutes Intravenous Every T-Th-Sa (Hemodialysis) 10/17/20 2343     10/18/20 1000  sulfamethoxazole-trimethoprim (BACTRIM DS) 800-160 MG per tablet 1 tablet        1 tablet Oral Every M-W-F 10/15/20 2147     10/18/20 0900  valGANciclovir (VALCYTE) 450 MG tablet TABS 450 mg       Note to Pharmacy: (Monday and Thursday) with largest meal of the day.     450 mg Oral Once per day on Mon Thu 10/15/20 2147      10/18/20 0200  vancomycin (VANCOREADY) IVPB 1250 mg/250 mL        1,250 mg 166.7 mL/hr over 90 Minutes Intravenous  Once 10/17/20 2343 10/18/20 0303   10/18/20 0100  ceFEPIme (MAXIPIME) 2 g in sodium chloride 0.9 % 100 mL  IVPB        2 g 200 mL/hr over 30 Minutes Intravenous  Once 10/17/20 2343 10/18/20 0122   10/17/20 2200  ceFEPIme (MAXIPIME) 1 g in sodium chloride 0.9 % 100 mL IVPB        1 g 200 mL/hr over 30 Minutes Intravenous Every 24 hours 10/17/20 2343        Culture/Microbiology    Component Value Date/Time   SDES  10/18/2020 1153    URINE, CLEAN CATCH Performed at [facility], [address] [place], [place] [address]    [facility]  10/18/2020 1153    NONE Performed at [facility], [address] [place],  [address]    CULT MULTIPLE SPECIES PRESENT, SUGGEST RECOLLECTION (A) 10/18/2020 1153   REPTSTATUS 10/19/2020 FINAL 10/18/2020 1153    Other culture-see note  Objective: Vitals: Today's Vitals   10/19/20 0554 10/19/20 0653 10/19/20 0817 10/19/20 1214  BP:   (!) 142/74 138/87  Pulse:   64 62  Resp:   17 16  Temp:   98 F (36.7 C) 98.6 F (37 C)  TempSrc:   Oral Oral  SpO2:   100% 100%  Weight:  75.1 kg    Height:      PainSc: 7   0-No pain 0-No pain    Intake/Output Summary (Last 24 hours) at 10/19/2020 1419 Last data filed at 10/19/2020 0434 Gross per 24 hour  Intake 240 ml  Output 850 ml  Net -610 ml   Filed Weights   10/17/20 0412 10/18/20 0253 10/19/20 0653  Weight: 71.6 kg 71.6 kg 75.1 kg   Weight change: 3.47 kg  Intake/Output from previous day: 07/11 0701 - 07/12 0700 In: 240 [P.O.:240] Out: 850 [Urine:850] Intake/Output this shift: No intake/output data recorded. Filed Weights   10/17/20 0412 10/18/20 0253 10/19/20 0653  Weight: 71.6 kg 71.6 kg 75.1 kg    Examination: Nad, calm Cta no w/r/r Regular s1/s2 Soft benign +bs No edema aaxoxo4  Data Reviewed: I have personally reviewed following labs  and imaging studies CBC: Recent Labs  Lab 10/13/20 0424 10/14/20 0444 10/15/20 2024 10/16/20 0539 10/16/20 1009 10/17/20 0611 10/18/20 0626 10/19/20 0523  WBC 2.7*   < > 2.0* 2.5*  --  2.1* 1.9* 1.8*  NEUTROABS 1.4*  --  1.1*  --   --  1.0* 1.0* 0.7*  HGB 7.7*   < > 7.8* 6.6* 6.6* 7.2* 7.6* 7.3*  HCT 21.9*   < > 22.5* 19.0* 19.0* 20.7* 21.5* 20.8*  MCV 97.3   < > 99.6 96.9  --  98.6 96.8 95.4  PLT 142*   < > 123* 116*  --  112* 132* 125*   < > = values in this interval not displayed.   Basic Metabolic Panel: Recent Labs  Lab 10/14/20 0444 10/15/20 2024 10/16/20 0539 10/16/20 1009 10/17/20 0611 10/18/20 0626 10/19/20 0523  NA 134* 132* 131* 133* 136 135 136  K 4.0 4.1 4.1 4.1 3.3* 3.9 3.8  CL 95* 92* 94* 96* 99 99 103  CO2 29 27 29 28 31 28 27   GLUCOSE 117* 222* 179* 175* 167* 188* 118*  BUN 38* 69* 69* 70* 34* 45* 42*  CREATININE 2.66* 4.04* 3.90* 4.14* 2.57* 2.85* 2.73*  CALCIUM 8.7* 8.7* 8.6* 8.7* 8.4* 8.5* 8.9  MG 1.6* 1.9  --   --   --   --   --    GFR: Estimated Creatinine Clearance: 28.3 mL/min (A) (by C-G formula  based on SCr of 2.73 mg/dL (H)). Liver Function Tests: Recent Labs  Lab 10/15/20 2024 10/19/20 0523  AST 22 14*  ALT 16 13  ALKPHOS 60 45  BILITOT 0.6 0.7  PROT 6.2* 5.3*  ALBUMIN 3.4* 3.0*    No results for input(s): AMMONIA in the last 168 hours.  Coagulation Profile: No results for input(s): INR, PROTIME in the last 168 hours.   CBG: Recent Labs  Lab 10/18/20 1201 10/18/20 1706 10/18/20 2130 10/19/20 0814 10/19/20 1217  GLUCAP 222* 243* 178* 123* 144*    Anemia Panel: No results for input(s): VITAMINB12, FOLATE, FERRITIN, TIBC, IRON, RETICCTPCT in the last 72 hours.  Recent Results (from the past 240 hour(s))  Resp Panel by RT-PCR (Flu A&B, Covid) Nasopharyngeal Swab     Status: None   Collection Time: 10/10/20  2:45 PM   Specimen: Nasopharyngeal Swab; Nasopharyngeal(NP) swabs in vial transport medium  Result Value Ref  Range Status   SARS Coronavirus 2 by RT PCR NEGATIVE NEGATIVE Final    Comment: (NOTE) SARS-CoV-2 target nucleic acids are NOT DETECTED.  The SARS-CoV-2 RNA is generally detectable in upper respiratory specimens during the acute phase of infection. The lowest concentration of SARS-CoV-2 viral copies this assay can detect is 138 copies/mL. A negative result does not preclude SARS-Cov-2 infection and should not be used as the sole basis for treatment or other patient management decisions. A negative result may occur with  improper specimen collection/handling, submission of specimen other than nasopharyngeal swab, presence of viral mutation(s) within the areas targeted by this assay, and inadequate number of viral copies(<138 copies/mL). A negative result must be combined with clinical observations, patient history, and epidemiological information. The expected result is Negative.  Fact Sheet for Patients:  EntrepreneurPulse.com.au  Fact Sheet for Healthcare Providers:  IncredibleEmployment.be  This test is no t yet approved or cleared by the [place] FDA and  has been authorized for detection and/or diagnosis of SARS-CoV-2 by FDA under an Emergency Use Authorization (EUA). This EUA will remain  in effect (meaning this test can be used) for the duration of the COVID-19 declaration under Section 564(b)(1) of the Act, 21 U.S.C.section 360bbb-3(b)(1), unless the authorization is terminated  or revoked sooner.       Influenza A by PCR NEGATIVE NEGATIVE Final   Influenza B by PCR NEGATIVE NEGATIVE Final    Comment: (NOTE) The Xpert Xpress SARS-CoV-2/FLU/RSV plus assay is intended as an aid in the diagnosis of influenza from Nasopharyngeal swab specimens and should not be used as a sole basis for treatment. Nasal washings and aspirates are unacceptable for Xpert Xpress SARS-CoV-2/FLU/RSV testing.  Fact Sheet for  Patients: EntrepreneurPulse.com.au  Fact Sheet for Healthcare Providers: IncredibleEmployment.be  This test is not yet approved or cleared by the [place] FDA and has been authorized for detection and/or diagnosis of SARS-CoV-2 by FDA under an Emergency Use Authorization (EUA). This EUA will remain in effect (meaning this test can be used) for the duration of the COVID-19 declaration under Section 564(b)(1) of the Act, 21 U.S.C. section 360bbb-3(b)(1), unless the authorization is terminated or revoked.  Performed at [facility], [address] [place], [place] [address]   Resp Panel by RT-PCR (Flu A&B, Covid) Nasopharyngeal Swab     Status: None   Collection Time: 10/15/20  8:24 PM   Specimen: Nasopharyngeal Swab; Nasopharyngeal(NP) swabs in vial transport medium  Result Value Ref Range Status   SARS Coronavirus 2 by RT PCR NEGATIVE NEGATIVE Final  Comment: (NOTE) SARS-CoV-2 target nucleic acids are NOT DETECTED.  The SARS-CoV-2 RNA is generally detectable in upper respiratory specimens during the acute phase of infection. The lowest concentration of SARS-CoV-2 viral copies this assay can detect is 138 copies/mL. A negative result does not preclude SARS-Cov-2 infection and should not be used as the sole basis for treatment or other patient management decisions. A negative result may occur with  improper specimen collection/handling, submission of specimen other than nasopharyngeal swab, presence of viral mutation(s) within the areas targeted by this assay, and inadequate number of viral copies(<138 copies/mL). A negative result must be combined with clinical observations, patient history, and epidemiological information. The expected result is Negative.  Fact Sheet for Patients:  EntrepreneurPulse.com.au  Fact Sheet for Healthcare Providers:  IncredibleEmployment.be  This test is no t  yet approved or cleared by the [place] FDA and  has been authorized for detection and/or diagnosis of SARS-CoV-2 by FDA under an Emergency Use Authorization (EUA). This EUA will remain  in effect (meaning this test can be used) for the duration of the COVID-19 declaration under Section 564(b)(1) of the Act, 21 U.S.C.section 360bbb-3(b)(1), unless the authorization is terminated  or revoked sooner.       Influenza A by PCR NEGATIVE NEGATIVE Final   Influenza B by PCR NEGATIVE NEGATIVE Final    Comment: (NOTE) The Xpert Xpress SARS-CoV-2/FLU/RSV plus assay is intended as an aid in the diagnosis of influenza from Nasopharyngeal swab specimens and should not be used as a sole basis for treatment. Nasal washings and aspirates are unacceptable for Xpert Xpress SARS-CoV-2/FLU/RSV testing.  Fact Sheet for Patients: EntrepreneurPulse.com.au  Fact Sheet for Healthcare Providers: IncredibleEmployment.be  This test is not yet approved or cleared by the [place] FDA and has been authorized for detection and/or diagnosis of SARS-CoV-2 by FDA under an Emergency Use Authorization (EUA). This EUA will remain in effect (meaning this test can be used) for the duration of the COVID-19 declaration under Section 564(b)(1) of the Act, 21 U.S.C. section 360bbb-3(b)(1), unless the authorization is terminated or revoked.  Performed at [facility], [address] [place], [place] [address]   CULTURE, BLOOD (ROUTINE X 2) w Reflex to ID Panel     Status: None (Preliminary result)   Collection Time: 10/18/20  6:26 AM   Specimen: BLOOD  Result Value Ref Range Status   Specimen Description BLOOD RIGHT HAND  Final   Special Requests   Final    BOTTLES DRAWN AEROBIC AND ANAEROBIC Blood Culture adequate volume   Culture   Final    NO GROWTH 1 DAY Performed at [facility], [address], [place], [place] [address]    Report Status  PENDING  Incomplete  CULTURE, BLOOD (ROUTINE X 2) w Reflex to ID Panel     Status: None (Preliminary result)   Collection Time: 10/18/20  6:26 AM   Specimen: BLOOD  Result Value Ref Range Status   Specimen Description BLOOD LEFT ASSIST CONTROL  Final   Special Requests   Final    BOTTLES DRAWN AEROBIC AND ANAEROBIC Blood Culture adequate volume   Culture   Final    NO GROWTH 1 DAY Performed at [facility], [address], [place], [place] [address]    Report Status PENDING  Incomplete  Urine Culture     Status: Abnormal   Collection Time: 10/18/20 11:53 AM   Specimen: Urine, Clean Catch  Result Value Ref Range Status   Specimen Description  Final    URINE, CLEAN CATCH Performed at [facility], [address] [place], [place] [address]    Special Requests   Final    NONE Performed at [facility], [address] [place], [place] [address]    Culture MULTIPLE SPECIES PRESENT, SUGGEST RECOLLECTION (A)  Final   Report Status 10/19/2020 FINAL  Final      Radiology Studies: CT ABDOMEN PELVIS WO CONTRAST  Result Date: 10/18/2020 CLINICAL DATA:  Abdominal pain with recurrent nausea and vomiting, history of liver transplantation EXAM: CT ABDOMEN AND PELVIS WITHOUT CONTRAST TECHNIQUE: Multidetector CT imaging of the abdomen and pelvis was performed following the standard protocol without IV contrast. COMPARISON:  October 16, 2019 FINDINGS: Lower chest: Small right greater than left pleural effusions bibasilar atelectasis. Hepatobiliary: Postsurgical changes of prior liver transplant with unremarkable noncontrast appearance of the hepatic parenchyma. Periportal edema. Gallbladder is surgically absent. Although difficult to evaluate on noncontrast examination there is possible prominence of the intrahepatic biliary ducts. Pancreas: Unremarkable noncontrast appearance of the pancreatic parenchyma. No pancreatic ductal dilation. Spleen: Within normal limits.  Adrenals/Urinary Tract: Right adrenal thickening without discrete nodularity. Left adrenal gland is unremarkable. No hydronephrosis. No renal, ureteral or bladder calculi visualized. Urinary bladder is grossly unremarkable for degree of distension. Stomach/Bowel: Percutaneous gastrojejunostomy tube. Stomach is grossly unremarkable. No pathologic dilation of small bowel. The appendix appears surgically absent. Small volume stool in the ascending colon. Gaseous distension of the transverse colon. The descending and sigmoid colon are decompressed. Colonic diverticulosis without findings of acute diverticulitis. No pneumatosis or portal venous gas. Vascular/Lymphatic: Aortic atherosclerosis without aneurysmal dilation. Prominent upper abdominal and retroperitoneal lymph nodes, likely reactive. No pathologically enlarged abdominal or pelvic lymph nodes. Reproductive: Prosthetic enlargement. Other: Mesenteric edema with trace ascites.  No pneumoperitoneum. Musculoskeletal: New superior endplate compression deformity of the L2 vertebral body with approximately 40% height loss multilevel degenerative changes spine. Degenerative changes bilateral hips and SI joints. IMPRESSION: 1. Postsurgical changes of hepatic transplant with unremarkable noncontrast appearance of the hepatic parenchyma. 2. Possible subtle prominence of the intrahepatic biliary ducts, recommend correlation with laboratory values for obstruction. 3. Periportal edema and mesenteric stranding with trace ascites, possibly related to dialysis dependent end-stage renal disease. 4. Small right greater than left pleural effusions with bibasilar atelectasis. 5. New superior endplate compression deformity of the L2 vertebral body with approximately 40% height loss. 6. Colonic diverticulosis without findings of acute diverticulitis. 7.  Aortic Atherosclerosis (ICD10-I70.0). Electronically Signed   By: Dahlia Bailiff MD   On: 10/18/2020 22:44     LOS: 3 days    Nolberto Hanlon, MD [facility]  10/19/2020, 2:19 PM

## 2020-10-19 NOTE — Progress Notes (Addendum)
[facility]  ROUNDING NOTE   Subjective:   Fatigued this morning  Hemodialysis later today  Denies nausea, vomiting, but states feeding tube is clogged  Objective:  Vital signs in last 24 hours:  Temp:  [97.9 F (36.6 C)-98.6 F (37 C)] 98 F (36.7 C) (07/12 0817) Pulse Rate:  [64-94] 64 (07/12 0817) Resp:  [16-17] 17 (07/12 0817) BP: (137-152)/(74-89) 142/74 (07/12 0817) SpO2:  [99 %-100 %] 100 % (07/12 0817) Weight:  [75.1 kg] 75.1 kg (07/12 0653)  Weight change: 3.47 kg Filed Weights   10/17/20 0412 10/18/20 0253 10/19/20 0653  Weight: 71.6 kg 71.6 kg 75.1 kg    Intake/Output: I/O last 3 completed shifts: In: 0321 [P.O.:240; NG/GT:935; IV Piggyback:250] Out: 2248 [Urine:1350]   Intake/Output this shift:  No intake/output data recorded.  Physical Exam: General: NAD, laying in bed  Head: Normocephalic, atraumatic. Dry oral mucosal membranes  Eyes: Anicteric  Lungs:  Clear to auscultation, normal effort  Heart: Regular rate and rhythm  Abdomen:  Soft, nontender, GJ tube  Extremities: no peripheral edema.  Neurologic: Nonfocal, moving all four extremities  Skin: No lesions  Access: RIJ permcath    Basic Metabolic Panel: Recent Labs  Lab 10/14/20 0444 10/15/20 2024 10/16/20 0539 10/16/20 1009 10/17/20 0611 10/18/20 0626 10/19/20 0523  NA 134* 132* 131* 133* 136 135 136  K 4.0 4.1 4.1 4.1 3.3* 3.9 3.8  CL 95* 92* 94* 96* 99 99 103  CO2 29 27 29 28 31 28 27   GLUCOSE 117* 222* 179* 175* 167* 188* 118*  BUN 38* 69* 69* 70* 34* 45* 42*  CREATININE 2.66* 4.04* 3.90* 4.14* 2.57* 2.85* 2.73*  CALCIUM 8.7* 8.7* 8.6* 8.7* 8.4* 8.5* 8.9  MG 1.6* 1.9  --   --   --   --   --      Liver Function Tests: Recent Labs  Lab 10/15/20 2024 10/19/20 0523  AST 22 14*  ALT 16 13  ALKPHOS 60 45  BILITOT 0.6 0.7  PROT 6.2* 5.3*  ALBUMIN 3.4* 3.0*    No results for input(s): LIPASE, AMYLASE in the last 168 hours. No results for input(s): AMMONIA  in the last 168 hours.   CBC: Recent Labs  Lab 10/13/20 0424 10/14/20 0444 10/15/20 2024 10/16/20 0539 10/16/20 1009 10/17/20 0611 10/18/20 0626 10/19/20 0523  WBC 2.7*   < > 2.0* 2.5*  --  2.1* 1.9* 1.8*  NEUTROABS 1.4*  --  1.1*  --   --  1.0* 1.0* 0.7*  HGB 7.7*   < > 7.8* 6.6* 6.6* 7.2* 7.6* 7.3*  HCT 21.9*   < > 22.5* 19.0* 19.0* 20.7* 21.5* 20.8*  MCV 97.3   < > 99.6 96.9  --  98.6 96.8 95.4  PLT 142*   < > 123* 116*  --  112* 132* 125*   < > = values in this interval not displayed.     Cardiac Enzymes: No results for input(s): CKTOTAL, CKMB, CKMBINDEX, TROPONINI in the last 168 hours.  BNP: Invalid input(s): POCBNP  CBG: Recent Labs  Lab 10/18/20 0806 10/18/20 1201 10/18/20 1706 10/18/20 2130 10/19/20 0814  GLUCAP 153* 222* 243* 178* 123*     Microbiology: Results for orders placed or performed during the hospital encounter of 10/15/20  Resp Panel by RT-PCR (Flu A&B, Covid) Nasopharyngeal Swab     Status: None   Collection Time: 10/15/20  8:24 PM   Specimen: Nasopharyngeal Swab; Nasopharyngeal(NP) swabs in vial transport medium  Result  Value Ref Range Status   SARS Coronavirus 2 by RT PCR NEGATIVE NEGATIVE Final    Comment: (NOTE) SARS-CoV-2 target nucleic acids are NOT DETECTED.  The SARS-CoV-2 RNA is generally detectable in upper respiratory specimens during the acute phase of infection. The lowest concentration of SARS-CoV-2 viral copies this assay can detect is 138 copies/mL. A negative result does not preclude SARS-Cov-2 infection and should not be used as the sole basis for treatment or other patient management decisions. A negative result may occur with  improper specimen collection/handling, submission of specimen other than nasopharyngeal swab, presence of viral mutation(s) within the areas targeted by this assay, and inadequate number of viral copies(<138 copies/mL). A negative result must be combined with clinical observations, patient  history, and epidemiological information. The expected result is Negative.  Fact Sheet for Patients:  EntrepreneurPulse.com.au  Fact Sheet for Healthcare Providers:  IncredibleEmployment.be  This test is no t yet approved or cleared by the [place] FDA and  has been authorized for detection and/or diagnosis of SARS-CoV-2 by FDA under an Emergency Use Authorization (EUA). This EUA will remain  in effect (meaning this test can be used) for the duration of the COVID-19 declaration under Section 564(b)(1) of the Act, 21 U.S.C.section 360bbb-3(b)(1), unless the authorization is terminated  or revoked sooner.       Influenza A by PCR NEGATIVE NEGATIVE Final   Influenza B by PCR NEGATIVE NEGATIVE Final    Comment: (NOTE) The Xpert Xpress SARS-CoV-2/FLU/RSV plus assay is intended as an aid in the diagnosis of influenza from Nasopharyngeal swab specimens and should not be used as a sole basis for treatment. Nasal washings and aspirates are unacceptable for Xpert Xpress SARS-CoV-2/FLU/RSV testing.  Fact Sheet for Patients: EntrepreneurPulse.com.au  Fact Sheet for Healthcare Providers: IncredibleEmployment.be  This test is not yet approved or cleared by the [place] FDA and has been authorized for detection and/or diagnosis of SARS-CoV-2 by FDA under an Emergency Use Authorization (EUA). This EUA will remain in effect (meaning this test can be used) for the duration of the COVID-19 declaration under Section 564(b)(1) of the Act, 21 U.S.C. section 360bbb-3(b)(1), unless the authorization is terminated or revoked.  Performed at [facility], [address] [place], [place] [address]   CULTURE, BLOOD (ROUTINE X 2) w Reflex to ID Panel     Status: None (Preliminary result)   Collection Time: 10/18/20  6:26 AM   Specimen: BLOOD  Result Value Ref Range Status   Specimen Description BLOOD RIGHT  HAND  Final   Special Requests   Final    BOTTLES DRAWN AEROBIC AND ANAEROBIC Blood Culture adequate volume   Culture   Final    NO GROWTH 1 DAY Performed at [facility], [address], [place], [place] [address]    Report Status PENDING  Incomplete  CULTURE, BLOOD (ROUTINE X 2) w Reflex to ID Panel     Status: None (Preliminary result)   Collection Time: 10/18/20  6:26 AM   Specimen: BLOOD  Result Value Ref Range Status   Specimen Description BLOOD LEFT ASSIST CONTROL  Final   Special Requests   Final    BOTTLES DRAWN AEROBIC AND ANAEROBIC Blood Culture adequate volume   Culture   Final    NO GROWTH 1 DAY Performed at [facility], [address] [place], [place] [address]    Report Status PENDING  Incomplete  Urine Culture     Status: Abnormal   Collection Time: 10/18/20  11:53 AM   Specimen: Urine, Clean Catch  Result Value Ref Range Status   Specimen Description   Final    URINE, CLEAN CATCH Performed at [facility], [address] [place], [place] [address]    Special Requests   Final    NONE Performed at [facility], [address] [place],  [address]    Culture MULTIPLE SPECIES PRESENT, SUGGEST RECOLLECTION (A)  Final   Report Status 10/19/2020 FINAL  Final    Coagulation Studies: No results for input(s): LABPROT, INR in the last 72 hours.  Urinalysis: Recent Labs    10/18/20 1153  COLORURINE STRAW*  LABSPEC 1.010  PHURINE 7.0  GLUCOSEU NEGATIVE  HGBUR NEGATIVE  BILIRUBINUR NEGATIVE  KETONESUR NEGATIVE  PROTEINUR NEGATIVE  NITRITE NEGATIVE  LEUKOCYTESUR NEGATIVE       Imaging: CT ABDOMEN PELVIS WO CONTRAST  Result Date: 10/18/2020 CLINICAL DATA:  Abdominal pain with recurrent nausea and vomiting, history of liver transplantation EXAM: CT ABDOMEN AND PELVIS WITHOUT CONTRAST TECHNIQUE: Multidetector CT imaging of the abdomen and pelvis was performed following the standard protocol without IV  contrast. COMPARISON:  October 16, 2019 FINDINGS: Lower chest: Small right greater than left pleural effusions bibasilar atelectasis. Hepatobiliary: Postsurgical changes of prior liver transplant with unremarkable noncontrast appearance of the hepatic parenchyma. Periportal edema. Gallbladder is surgically absent. Although difficult to evaluate on noncontrast examination there is possible prominence of the intrahepatic biliary ducts. Pancreas: Unremarkable noncontrast appearance of the pancreatic parenchyma. No pancreatic ductal dilation. Spleen: Within normal limits. Adrenals/Urinary Tract: Right adrenal thickening without discrete nodularity. Left adrenal gland is unremarkable. No hydronephrosis. No renal, ureteral or bladder calculi visualized. Urinary bladder is grossly unremarkable for degree of distension. Stomach/Bowel: Percutaneous gastrojejunostomy tube. Stomach is grossly unremarkable. No pathologic dilation of small bowel. The appendix appears surgically absent. Small volume stool in the ascending colon. Gaseous distension of the transverse colon. The descending and sigmoid colon are decompressed. Colonic diverticulosis without findings of acute diverticulitis. No pneumatosis or portal venous gas. Vascular/Lymphatic: Aortic atherosclerosis without aneurysmal dilation. Prominent upper abdominal and retroperitoneal lymph nodes, likely reactive. No pathologically enlarged abdominal or pelvic lymph nodes. Reproductive: Prosthetic enlargement. Other: Mesenteric edema with trace ascites.  No pneumoperitoneum. Musculoskeletal: New superior endplate compression deformity of the L2 vertebral body with approximately 40% height loss multilevel degenerative changes spine. Degenerative changes bilateral hips and SI joints. IMPRESSION: 1. Postsurgical changes of hepatic transplant with unremarkable noncontrast appearance of the hepatic parenchyma. 2. Possible subtle prominence of the intrahepatic biliary ducts, recommend  correlation with laboratory values for obstruction. 3. Periportal edema and mesenteric stranding with trace ascites, possibly related to dialysis dependent end-stage renal disease. 4. Small right greater than left pleural effusions with bibasilar atelectasis. 5. New superior endplate compression deformity of the L2 vertebral body with approximately 40% height loss. 6. Colonic diverticulosis without findings of acute diverticulitis. 7.  Aortic Atherosclerosis (ICD10-I70.0). Electronically Signed   By: Dahlia Bailiff MD   On: 10/18/2020 22:44   MR BRAIN WO CONTRAST  Result Date: 10/17/2020 CLINICAL DATA:  Neuro deficit, acute stroke suspected.  Dizziness. EXAM: MRI HEAD WITHOUT CONTRAST TECHNIQUE: Multiplanar, multiecho pulse sequences of the brain and surrounding structures were obtained without intravenous contrast. COMPARISON:  MRI October 17, 2019. FINDINGS: Brain: No acute infarction, hemorrhage, hydrocephalus, extra-axial collection or mass lesion. Mild to moderate scattered small T2/FLAIR hyperintensities within the white matter, which are nonspecific but potentially related to chronic microvascular ischemic disease. Vascular: Major arterial flow voids are maintained  at the skull base. Skull and upper cervical spine: Normal marrow signal. Sinuses/Orbits: Visualized sinuses are clear.  Unremarkable orbits. Other: No mastoid effusions. IMPRESSION: 1. No evidence of acute intracranial abnormality. 2. Mild to moderate chronic microvascular ischemic disease. Electronically Signed   By: Margaretha Sheffield MD   On: 10/17/2020 12:26     Medications:    sodium chloride 250 mL (10/18/20 2307)   ceFEPime (MAXIPIME) IV 1 g (10/18/20 2320)   promethazine (PHENERGAN) injection (IM or IVPB)     vancomycin      atorvastatin  40 mg Oral QHS   Chlorhexidine Gluconate Cloth  6 each Topical Daily   epoetin (EPOGEN/PROCRIT) injection  10,000 Units Intravenous Q T,Th,Sa-HD   feeding supplement (NEPRO CARB STEADY)  1,190  mL Per Tube Q24H   free water  30 mL Per Tube Q4H   heparin  5,000 Units Subcutaneous Q8H   insulin aspart  0-6 Units Subcutaneous TID WC   megestrol  40 mg Oral Daily   mirtazapine  15 mg Oral QHS   multivitamin  1 tablet Per Tube QHS   mycophenolate  1,000 mg Oral BID   pantoprazole (PROTONIX) IV  40 mg Intravenous Q12H   predniSONE  17.5 mg Oral Daily   simethicone  160 mg Oral QID   sulfamethoxazole-trimethoprim  1 tablet Oral Q M,W,F   tacrolimus  7 mg Oral BID   valGANciclovir  450 mg Oral Once per day on Mon Thu   sodium chloride, acetaminophen **OR** acetaminophen, hydrOXYzine, nitroGLYCERIN, ondansetron **OR** ondansetron (ZOFRAN) IV, promethazine (PHENERGAN) injection (IM or IVPB), senna-docusate, traZODone  Assessment/ Plan:  [name] is a 65 y.o. white male with end stager renal disease, status post liver transplant, hyperlipidemia, hypertension, coronary artery disease, depression who is re-admitted to [facility] on 10/15/2020 for Syncope and collapse [R55] Syncope [R55]  [facility] TTS [address] RIJ permcath 69kg  End Stage renal disease: Continue TTS schedule. Next treatment scheduled for today. UA  and abd [place] negative  Hypotension: 142/74 - at goal.  Holding metoprolol.    Anemia of chronic kidney disease: hemoglobin 7.3  - PRBC transfusion on 7/9.  -  Continue EPO with HD treatments  Secondary Hyperparathyroidism: calcium and phosphorus at goal. Not currently on binders.     LOS: 3 Kayelee Herbig 7/12/202211:35 AM

## 2020-10-20 DIAGNOSIS — D72819 Decreased white blood cell count, unspecified: Secondary | ICD-10-CM

## 2020-10-20 LAB — CBC
HCT: 23.2 % — ABNORMAL LOW (ref 39.0–52.0)
Hemoglobin: 8.1 g/dL — ABNORMAL LOW (ref 13.0–17.0)
MCH: 34 pg (ref 26.0–34.0)
MCHC: 34.9 g/dL (ref 30.0–36.0)
MCV: 97.5 fL (ref 80.0–100.0)
Platelets: 127 10*3/uL — ABNORMAL LOW (ref 150–400)
RBC: 2.38 MIL/uL — ABNORMAL LOW (ref 4.22–5.81)
RDW: 14.6 % (ref 11.5–15.5)
WBC: 1.8 10*3/uL — ABNORMAL LOW (ref 4.0–10.5)
nRBC: 0 % (ref 0.0–0.2)

## 2020-10-20 LAB — GLUCOSE, CAPILLARY
Glucose-Capillary: 187 mg/dL — ABNORMAL HIGH (ref 70–99)
Glucose-Capillary: 130 mg/dL — ABNORMAL HIGH (ref 70–99)
Glucose-Capillary: 215 mg/dL — ABNORMAL HIGH (ref 70–99)

## 2020-10-20 NOTE — Progress Notes (Signed)
Pharmacy Antibiotic Note  Alex Hampton is a 65 y.o. male with ESRD on HD, Cirrhosis s/p Liver Transplant on immunosuppressants, Severe Protein Calorie Malnutrition with G tube in placeadmitted on 10/15/2020 with recurrent nausea and vomiting x 3 days followed by 2 syncopal episodes and now with antibiotics being started for sepsis. Pharmacy has been consulted for vancomycin and cefepime dosing.  Plan:  1) Day 3- Cefepime 1 gram IV every 24 hours to be timed after HD sessions  2) Day 3- Vancomycin 750 mg IV with each HD session TTS Level prior to 3rd HD session: target level 15 - 25 mcg/mL HD on 7/12   Height: 6\' 1"  (185.4 cm) Weight: 74.4 kg (164 lb) IBW/kg (Calculated) : 79.9  Temp (24hrs), Avg:98.2 F (36.8 C), Min:97.9 F (36.6 C), Max:98.6 F (37 C)  Recent Labs  Lab 10/16/20 0539 10/16/20 1009 10/17/20 0611 10/18/20 0626 10/19/20 0523 10/20/20 0415  WBC 2.5*  --  2.1* 1.9* 1.8* 1.8*  CREATININE 3.90* 4.14* 2.57* 2.85* 2.73*  --      Estimated Creatinine Clearance: 28 mL/min (A) (by C-G formula based on SCr of 2.73 mg/dL (H)).    No Known Allergies  Antimicrobials this admission: vancomycin 07/11 >>  cefepime 07/11 >>   Microbiology results: 07/10 BCx: NG x 2D 7/11 Ucx: multiple species 07/08 SARS CoV-2: negative 07/08 influenza A/B: negative  Thank you for allowing pharmacy to be a part of this patient's care.  Lilton Pare A 10/20/2020 10:51 AM

## 2020-10-20 NOTE — Progress Notes (Signed)
[facility]  ROUNDING NOTE   Subjective:   Patient seen resting in bed Alert, but drowsy Feeding tube unclogged, receiving tube feeds Tolerating well Denies nausea, vomiting and diarrhea  Objective:  Vital signs in last 24 hours:  Temp:  [97.9 F (36.6 C)-98.6 F (37 C)] 97.9 F (36.6 C) (07/13 0748) Pulse Rate:  [62-83] 78 (07/13 0748) Resp:  [13-22] 17 (07/13 0748) BP: (110-168)/(63-91) 135/76 (07/13 0748) SpO2:  [100 %] 100 % (07/13 0748) Weight:  [74.4 kg] 74.4 kg (07/13 0500)  Weight change: -0.68 kg Filed Weights   10/18/20 0253 10/19/20 0653 10/20/20 0500  Weight: 71.6 kg 75.1 kg 74.4 kg    Intake/Output: I/O last 3 completed shifts: In: 4913.2 [P.O.:240; I.V.:143.8; Other:30; NW/GN:5621.3; IV Piggyback:350] Out: 2150 [Urine:1150; Other:1000]   Intake/Output this shift:  No intake/output data recorded.  Physical Exam: General: NAD, laying in bed  Head: Normocephalic, atraumatic. Dry oral mucosal membranes  Eyes: Anicteric  Lungs:  Clear to auscultation, normal effort  Heart: Regular rate and rhythm  Abdomen:  Soft, nontender, GJ tube  Extremities: no peripheral edema.  Neurologic: Nonfocal, moving all four extremities  Skin: No lesions  Access: RIJ permcath    Basic Metabolic Panel: Recent Labs  Lab 10/14/20 0444 10/15/20 2024 10/16/20 0539 10/16/20 1009 10/17/20 0611 10/18/20 0626 10/19/20 0523  NA 134* 132* 131* 133* 136 135 136  K 4.0 4.1 4.1 4.1 3.3* 3.9 3.8  CL 95* 92* 94* 96* 99 99 103  CO2 29 27 29 28 31 28 27   GLUCOSE 117* 222* 179* 175* 167* 188* 118*  BUN 38* 69* 69* 70* 34* 45* 42*  CREATININE 2.66* 4.04* 3.90* 4.14* 2.57* 2.85* 2.73*  CALCIUM 8.7* 8.7* 8.6* 8.7* 8.4* 8.5* 8.9  MG 1.6* 1.9  --   --   --   --   --      Liver Function Tests: Recent Labs  Lab 10/15/20 2024 10/19/20 0523  AST 22 14*  ALT 16 13  ALKPHOS 60 45  BILITOT 0.6 0.7  PROT 6.2* 5.3*  ALBUMIN 3.4* 3.0*    No results for input(s):  LIPASE, AMYLASE in the last 168 hours. No results for input(s): AMMONIA in the last 168 hours.   CBC: Recent Labs  Lab 10/15/20 2024 10/16/20 0539 10/16/20 1009 10/17/20 0611 10/18/20 0626 10/19/20 0523 10/20/20 0415  WBC 2.0* 2.5*  --  2.1* 1.9* 1.8* 1.8*  NEUTROABS 1.1*  --   --  1.0* 1.0* 0.7*  --   HGB 7.8* 6.6* 6.6* 7.2* 7.6* 7.3* 8.1*  HCT 22.5* 19.0* 19.0* 20.7* 21.5* 20.8* 23.2*  MCV 99.6 96.9  --  98.6 96.8 95.4 97.5  PLT 123* 116*  --  112* 132* 125* 127*     Cardiac Enzymes: No results for input(s): CKTOTAL, CKMB, CKMBINDEX, TROPONINI in the last 168 hours.  BNP: Invalid input(s): POCBNP  CBG: Recent Labs  Lab 10/19/20 0814 10/19/20 1217 10/19/20 1810 10/19/20 2008 10/20/20 0824  GLUCAP 123* 144* 128* 184* 187*     Microbiology: Results for orders placed or performed during the hospital encounter of 10/15/20  Resp Panel by RT-PCR (Flu A&B, Covid) Nasopharyngeal Swab     Status: None   Collection Time: 10/15/20  8:24 PM   Specimen: Nasopharyngeal Swab; Nasopharyngeal(NP) swabs in vial transport medium  Result Value Ref Range Status   SARS Coronavirus 2 by RT PCR NEGATIVE NEGATIVE Final    Comment: (NOTE) SARS-CoV-2 target nucleic acids are NOT DETECTED.  The SARS-CoV-2 RNA is generally detectable in upper respiratory specimens during the acute phase of infection. The lowest concentration of SARS-CoV-2 viral copies this assay can detect is 138 copies/mL. A negative result does not preclude SARS-Cov-2 infection and should not be used as the sole basis for treatment or other patient management decisions. A negative result may occur with  improper specimen collection/handling, submission of specimen other than nasopharyngeal swab, presence of viral mutation(s) within the areas targeted by this assay, and inadequate number of viral copies(<138 copies/mL). A negative result must be combined with clinical observations, patient history, and  epidemiological information. The expected result is Negative.  Fact Sheet for Patients:  EntrepreneurPulse.com.au  Fact Sheet for Healthcare Providers:  IncredibleEmployment.be  This test is no t yet approved or cleared by the [place] FDA and  has been authorized for detection and/or diagnosis of SARS-CoV-2 by FDA under an Emergency Use Authorization (EUA). This EUA will remain  in effect (meaning this test can be used) for the duration of the COVID-19 declaration under Section 564(b)(1) of the Act, 21 U.S.C.section 360bbb-3(b)(1), unless the authorization is terminated  or revoked sooner.       Influenza A by PCR NEGATIVE NEGATIVE Final   Influenza B by PCR NEGATIVE NEGATIVE Final    Comment: (NOTE) The Xpert Xpress SARS-CoV-2/FLU/RSV plus assay is intended as an aid in the diagnosis of influenza from Nasopharyngeal swab specimens and should not be used as a sole basis for treatment. Nasal washings and aspirates are unacceptable for Xpert Xpress SARS-CoV-2/FLU/RSV testing.  Fact Sheet for Patients: EntrepreneurPulse.com.au  Fact Sheet for Healthcare Providers: IncredibleEmployment.be  This test is not yet approved or cleared by the [place] FDA and has been authorized for detection and/or diagnosis of SARS-CoV-2 by FDA under an Emergency Use Authorization (EUA). This EUA will remain in effect (meaning this test can be used) for the duration of the COVID-19 declaration under Section 564(b)(1) of the Act, 21 U.S.C. section 360bbb-3(b)(1), unless the authorization is terminated or revoked.  Performed at [facility], [address] [place], [place] [address]   CULTURE, BLOOD (ROUTINE X 2) w Reflex to ID Panel     Status: None (Preliminary result)   Collection Time: 10/18/20  6:26 AM   Specimen: BLOOD  Result Value Ref Range Status   Specimen Description BLOOD RIGHT HAND  Final    Special Requests   Final    BOTTLES DRAWN AEROBIC AND ANAEROBIC Blood Culture adequate volume   Culture   Final    NO GROWTH 2 DAYS Performed at [facility], [address] [place], [place] [address]    Report Status PENDING  Incomplete  CULTURE, BLOOD (ROUTINE X 2) w Reflex to ID Panel     Status: None (Preliminary result)   Collection Time: 10/18/20  6:26 AM   Specimen: BLOOD  Result Value Ref Range Status   Specimen Description BLOOD LEFT ASSIST CONTROL  Final   Special Requests   Final    BOTTLES DRAWN AEROBIC AND ANAEROBIC Blood Culture adequate volume   Culture   Final    NO GROWTH 2 DAYS Performed at [facility], [address], [place],  [address]    Report Status PENDING  Incomplete  Urine Culture     Status: Abnormal   Collection Time: 10/18/20 11:53 AM   Specimen: Urine, Clean Catch  Result Value Ref Range Status   Specimen Description   Final    URINE, CLEAN CATCH Performed  at [facility], [address] [place], [place] [address]    Special Requests   Final    NONE Performed at [facility], [address] [place], [place] [address]    Culture MULTIPLE SPECIES PRESENT, SUGGEST RECOLLECTION (A)  Final   Report Status 10/19/2020 FINAL  Final    Coagulation Studies: No results for input(s): LABPROT, INR in the last 72 hours.  Urinalysis: Recent Labs    10/18/20 1153  COLORURINE STRAW*  LABSPEC 1.010  PHURINE 7.0  GLUCOSEU NEGATIVE  HGBUR NEGATIVE  BILIRUBINUR NEGATIVE  KETONESUR NEGATIVE  PROTEINUR NEGATIVE  NITRITE NEGATIVE  LEUKOCYTESUR NEGATIVE       Imaging: CT ABDOMEN PELVIS WO CONTRAST  Result Date: 10/18/2020 CLINICAL DATA:  Abdominal pain with recurrent nausea and vomiting, history of liver transplantation EXAM: CT ABDOMEN AND PELVIS WITHOUT CONTRAST TECHNIQUE: Multidetector CT imaging of the abdomen and pelvis was performed following the standard protocol without IV contrast. COMPARISON:   October 16, 2019 FINDINGS: Lower chest: Small right greater than left pleural effusions bibasilar atelectasis. Hepatobiliary: Postsurgical changes of prior liver transplant with unremarkable noncontrast appearance of the hepatic parenchyma. Periportal edema. Gallbladder is surgically absent. Although difficult to evaluate on noncontrast examination there is possible prominence of the intrahepatic biliary ducts. Pancreas: Unremarkable noncontrast appearance of the pancreatic parenchyma. No pancreatic ductal dilation. Spleen: Within normal limits. Adrenals/Urinary Tract: Right adrenal thickening without discrete nodularity. Left adrenal gland is unremarkable. No hydronephrosis. No renal, ureteral or bladder calculi visualized. Urinary bladder is grossly unremarkable for degree of distension. Stomach/Bowel: Percutaneous gastrojejunostomy tube. Stomach is grossly unremarkable. No pathologic dilation of small bowel. The appendix appears surgically absent. Small volume stool in the ascending colon. Gaseous distension of the transverse colon. The descending and sigmoid colon are decompressed. Colonic diverticulosis without findings of acute diverticulitis. No pneumatosis or portal venous gas. Vascular/Lymphatic: Aortic atherosclerosis without aneurysmal dilation. Prominent upper abdominal and retroperitoneal lymph nodes, likely reactive. No pathologically enlarged abdominal or pelvic lymph nodes. Reproductive: Prosthetic enlargement. Other: Mesenteric edema with trace ascites.  No pneumoperitoneum. Musculoskeletal: New superior endplate compression deformity of the L2 vertebral body with approximately 40% height loss multilevel degenerative changes spine. Degenerative changes bilateral hips and SI joints. IMPRESSION: 1. Postsurgical changes of hepatic transplant with unremarkable noncontrast appearance of the hepatic parenchyma. 2. Possible subtle prominence of the intrahepatic biliary ducts, recommend correlation with  laboratory values for obstruction. 3. Periportal edema and mesenteric stranding with trace ascites, possibly related to dialysis dependent end-stage renal disease. 4. Small right greater than left pleural effusions with bibasilar atelectasis. 5. New superior endplate compression deformity of the L2 vertebral body with approximately 40% height loss. 6. Colonic diverticulosis without findings of acute diverticulitis. 7.  Aortic Atherosclerosis (ICD10-I70.0). Electronically Signed   By: Dahlia Bailiff MD   On: 10/18/2020 22:44     Medications:    sodium chloride Stopped (10/19/20 2243)   ceFEPime (MAXIPIME) IV Stopped (10/19/20 2216)   promethazine (PHENERGAN) injection (IM or IVPB)     vancomycin Stopped (10/19/20 1814)    atorvastatin  40 mg Oral QHS   Chlorhexidine Gluconate Cloth  6 each Topical Daily   epoetin (EPOGEN/PROCRIT) injection  10,000 Units Intravenous Q T,Th,Sa-HD   feeding supplement (NEPRO CARB STEADY)  1,190 mL Per Tube Q24H   free water  30 mL Per Tube Q4H   heparin  5,000 Units Subcutaneous Q8H   insulin aspart  0-6 Units Subcutaneous TID WC   megestrol  40 mg Oral Daily  mirtazapine  15 mg Oral QHS   multivitamin  1 tablet Per Tube QHS   mycophenolate  1,000 mg Oral BID   pantoprazole (PROTONIX) IV  40 mg Intravenous Q12H   predniSONE  17.5 mg Oral Daily   simethicone  160 mg Oral QID   sulfamethoxazole-trimethoprim  1 tablet Oral Q M,W,F   tacrolimus  7 mg Oral BID   valGANciclovir  450 mg Oral Once per day on Mon Thu   sodium chloride, acetaminophen **OR** acetaminophen, hydrOXYzine, nitroGLYCERIN, ondansetron **OR** ondansetron (ZOFRAN) IV, promethazine (PHENERGAN) injection (IM or IVPB), senna-docusate, traZODone  Assessment/ Plan:  Mr. BASHAR MILAM is a 65 y.o. white male with end stager renal disease, status post liver transplant, hyperlipidemia, hypertension, coronary artery disease, depression who is re-ad[facility] to [facility] on 10/15/2020 for Syncope and collapse  [R55] Syncope [R55]  [facility] TTS [address] RIJ permcath 69kg  End Stage renal disease: Continue TTS schedule. Received dialysis treatment. UF 1L achieved. Next scheduled treatment on Thursday.  Hypotension: 135/76 - at goal.  Holding metoprolol.    Anemia of chronic kidney disease: hemoglobin 8.1  - PRBC transfusion on 7/9.  -  Continue EPO with HD treatments  Secondary Hyperparathyroidism: calcium and phosphorus at goal. Not currently on binders.     LOS: 4 Dewanda Fennema 7/13/20229:25 AM

## 2020-10-20 NOTE — Evaluation (Signed)
Occupational Therapy Evaluation Patient Details Name: Alex Hampton MRN: [id number] DOB: 03-03-1956 Today's Date: 10/20/2020    History of Present Illness Alex Hampton is a 65 y.o. Caucasian male with medical history significant for  hypertension, hyperlipidemia, GERD, depression with anxiety, CAD, stent placement, anemia, liver cirrhosis with ascites (s/p of liver transplantation in [facility], on immunosuppressant), ESRD, OSA not on CPAP, s/p of G -tube, who presents with 2 syncopal episodes at home earlier today, preceded by lightheadedness and feeling woozy.   Clinical Impression   Alex Hampton was seen for OT evaluation this date. Prior to hospital admission, pt was MOD I for mobility and ADLs, assist from family/PCA for IADLs. Pt lives alone with family/PCA available near 24/7. Pt presents to acute OT demonstrating near baseline independence for mobility and ADLs. Pt currently requires MOD I for LB access seated EOC. SUPERVISION + RW for ADL t/f and single UE spport standing grooming tasks reaching outside BOS c no LOBs. All education completed with pt/son in room. Pt reports tub shower bench recommended last hospital stay but never ordered, continue to recommend. Upon hospital discharge, anticipate no OT follow up needed. Will sign off at this time.      Follow Up Recommendations  No OT follow up    Equipment Recommendations  Tub/shower bench    Recommendations for Other Services       Precautions / Restrictions Precautions Precautions: Fall Restrictions Weight Bearing Restrictions: No      Mobility Bed Mobility               General bed mobility comments: pt received and left in chair    Transfers Overall transfer level: Needs assistance Equipment used: Rolling walker (2 wheeled) Transfers: Sit to/from Stand Sit to Stand: Supervision              Balance Overall balance assessment: Needs assistance Sitting-balance support: No upper extremity supported;Feet  supported Sitting balance-Leahy Scale: Normal     Standing balance support: Single extremity supported;During functional activity Standing balance-Leahy Scale: Good Standing balance comment: steady standing with at least single UE support                           ADL either performed or assessed with clinical judgement   ADL Overall ADL's : Needs assistance/impaired                                       General ADL Comments: MOD I for LB access seated EOC. SUPERVISION + RW for ADL t/f and single UE spport standing grooming tasks reaching outside BOS c no LOBs     Vision Baseline Vision/History: Wears glasses Wears Glasses: At all times              Pertinent Vitals/Pain Pain Assessment: No/denies pain     Hand Dominance Right   Extremity/Trunk Assessment Upper Extremity Assessment Upper Extremity Assessment: Generalized weakness   Lower Extremity Assessment Lower Extremity Assessment: Generalized weakness       Communication Communication Communication: No difficulties   Cognition Arousal/Alertness: Awake/alert Behavior During Therapy: WFL for tasks assessed/performed Overall Cognitive Status: Within Functional Limits for tasks assessed  General Comments  SEATED: BP 116/78, HR 81. STADNING: BP 111/76    Exercises Exercises: Other exercises Other Exercises Other Exercises: Pt and family educated re: OT role, DME recs, d/c recs, falls prevention, ECS, home/routines modifications Other Exercises: LBD, grooming, functional reach, sit<>stand, sitting/standing balance/tolerance, ~340 ft mobility.   Shoulder Instructions      Home Living Family/patient expects to be discharged to:: Private residence Living Arrangements: Alone Available Help at Discharge: Family;Available 24 hours/day;Personal care attendant Type of Home: Apartment Home Access: Level entry     Home Layout: One  level     Bathroom Shower/Tub: Tub/shower unit         Home Equipment: Walker - 4 wheels;Bedside commode;Grab bars - tub/shower   Additional Comments: reports has been waiting for tub transfer bench      Prior Functioning/Environment Level of Independence: Needs assistance  Gait / Transfers Assistance Needed: MOD I using 4WW ADL's / Homemaking Assistance Needed: Reports assist from family/PCA for IADLs            OT Problem List: Decreased activity tolerance;Impaired balance (sitting and/or standing)         OT Goals(Current goals can be found in the care plan section) Acute Rehab OT Goals Patient Stated Goal: To feel better and go home OT Goal Formulation: With patient/family Time For Goal Achievement: 11/03/20 Potential to Achieve Goals: Good   AM-PAC OT "6 Clicks" Daily Activity     Outcome Measure Help from another person eating meals?: None Help from another person taking care of personal grooming?: A Little Help from another person toileting, which includes using toliet, bedpan, or urinal?: A Little Help from another person bathing (including washing, rinsing, drying)?: A Little Help from another person to put on and taking off regular upper body clothing?: None Help from another person to put on and taking off regular lower body clothing?: None 6 Click Score: 21   End of Session Equipment Utilized During Treatment: Rolling walker Nurse Communication: Mobility status  Activity Tolerance: Patient tolerated treatment well Patient left: in chair;with call bell/phone within reach;with nursing/sitter in room  OT Visit Diagnosis: Other abnormalities of gait and mobility (R26.89);Muscle weakness (generalized) (M62.81)                Time: 8127-5170 OT Time Calculation (min): 18 min Charges:  OT General Charges $OT Visit: 1 Visit OT Evaluation $OT Eval Low Complexity: 1 Low OT Treatments $Self Care/Home Management : 8-22 mins  Alex Hampton, M.S. OTR/L   10/20/20, 4:00 PM  ascom [phone]

## 2020-10-20 NOTE — Evaluation (Signed)
Physical Therapy Evaluation Patient Details Name: Alex Hampton MRN: [id number] DOB: [birth year] Today's Date: 10/20/2020   History of Present Illness  Alex Hampton is a 65 y.o. Caucasian male with medical history significant for  hypertension, hyperlipidemia, GERD, depression with anxiety, CAD, stent placement, anemia, liver cirrhosis with ascites (s/p of liver transplantation in [facility], on immunosuppressant), ESRD, OSA not on CPAP, s/p of G -tube, who presents with 2 syncopal episodes at home earlier today, preceded by lightheadedness and feeling woozy.   Clinical Impression  Pt admitted with above diagnosis. Pt seated in recliner agreeable to PT services. Pt mod-I STS to RW. Pt ambulating 60 with supervision with min VC's for RW sequencing with turns. Pt returned to chair then requesting to lay in bed. Supervision for STS with no device and use of UE's on bed rail to pivot and sit in bed. Mod-I seated EOB to supine. Pt appears to be near baseline function per subjective reports of being household ambulator with 18 hours/day of PCA to assist with ADL's/IADL's. Pt educated to remain on PT caseload to maintain current level of function. Pt currently with functional limitations due to the deficits listed below (see PT Problem List). Pt will benefit from skilled PT to increase their independence and safety with mobility to allow discharge to the venue listed below.      Follow Up Recommendations Home health PT;Supervision/Assistance - 24 hour    Equipment Recommendations  None recommended by PT    Recommendations for Other Services       Precautions / Restrictions Precautions Precautions: Fall Restrictions Weight Bearing Restrictions: No      Mobility  Bed Mobility Overal bed mobility: Modified Independent Bed Mobility: Supine to Sit Rolling: Modified independent (Device/Increase time)   Supine to sit: Modified independent (Device/Increase time)     General bed mobility comments: pt  received and left in chair    Transfers Overall transfer level: Needs assistance Equipment used: Rolling walker (2 wheeled) Transfers: Sit to/from Stand Sit to Stand: Supervision            Ambulation/Gait Ambulation/Gait assistance: Supervision Gait Distance (Feet): 60 Feet Assistive device: Rolling walker (2 wheeled) Gait Pattern/deviations: Step-through pattern Gait velocity: decreased      Stairs            Wheelchair Mobility    Modified Rankin (Stroke Patients Only)       Balance Overall balance assessment: Needs assistance Sitting-balance support: No upper extremity supported;Feet supported Sitting balance-Leahy Scale: Normal     Standing balance support: Bilateral upper extremity supported;During functional activity Standing balance-Leahy Scale: Good Standing balance comment: steady standing with at least single UE support                             Pertinent Vitals/Pain Pain Assessment: No/denies pain    Home Living Family/patient expects to be discharged to:: Private residence Living Arrangements: Alone Available Help at Discharge: Family;Available 24 hours/day;Personal care attendant Type of Home: Apartment Home Access: Level entry     Home Layout: One level Home Equipment: Walker - 4 wheels;Bedside commode;Grab bars - tub/shower Additional Comments: reports has been waiting for tub transfer bench    Prior Function Level of Independence: Needs assistance   Gait / Transfers Assistance Needed: MOD I using 4WW  ADL's / Homemaking Assistance Needed: Reports assist from family/PCA for IADLs        Hand Dominance   Dominant Hand: Right  Extremity/Trunk Assessment   Upper Extremity Assessment Upper Extremity Assessment: Generalized weakness;Defer to OT evaluation    Lower Extremity Assessment Lower Extremity Assessment: Generalized weakness    Cervical / Trunk Assessment Cervical / Trunk Assessment: Normal   Communication   Communication: No difficulties  Cognition Arousal/Alertness: Awake/alert Behavior During Therapy: WFL for tasks assessed/performed Overall Cognitive Status: Within Functional Limits for tasks assessed                                        General Comments General comments (skin integrity, edema, etc.): SEATED: BP 116/78, HR 81. STADNING: BP 111/76    Exercises Other Exercises Other Exercises: Pt and family educated re: OT role, DME recs, d/c recs, falls prevention, ECS, home/routines modifications Other Exercises: LBD, grooming, functional reach, sit<>stand, sitting/standing balance/tolerance, ~340 ft mobility.   Assessment/Plan    PT Assessment Patient needs continued PT services  PT Problem List Decreased strength;Decreased activity tolerance;Decreased balance;Decreased mobility       PT Treatment Interventions DME instruction;Gait training;Functional mobility training;Therapeutic activities;Therapeutic exercise;Balance training;Patient/family education    PT Goals (Current goals can be found in the Care Plan section)  Acute Rehab PT Goals Patient Stated Goal: To feel better and go home PT Goal Formulation: With patient Time For Goal Achievement: 10/27/20 Potential to Achieve Goals: Good    Frequency Min 2X/week   Barriers to discharge        Co-evaluation               AM-PAC PT "6 Clicks" Mobility  Outcome Measure Help needed turning from your back to your side while in a flat bed without using bedrails?: None Help needed moving from lying on your back to sitting on the side of a flat bed without using bedrails?: None Help needed moving to and from a bed to a chair (including a wheelchair)?: A Little Help needed standing up from a chair using your arms (e.g., wheelchair or bedside chair)?: A Little Help needed to walk in hospital room?: A Little Help needed climbing 3-5 steps with a railing? : A Little 6 Click Score: 20     End of Session Equipment Utilized During Treatment: Gait belt Activity Tolerance: Patient tolerated treatment well Patient left: in bed;with call bell/phone within reach;with bed alarm set Nurse Communication: Mobility status PT Visit Diagnosis: Other abnormalities of gait and mobility (R26.89);Muscle weakness (generalized) (M62.81);History of falling (Z91.81)    Time: 6962-9528 PT Time Calculation (min) (ACUTE ONLY): 17 min   Charges:   PT Evaluation $PT Eval Low Complexity: 1 Low PT Treatments $Therapeutic Activity: 8-22 mins        Alex Hampton M. Fairly IV, PT, DPT Physical Therapist- [facility]  10/20/2020, 4:22 PM

## 2020-10-20 NOTE — Progress Notes (Signed)
PROGRESS NOTE    Alex Hampton  AVW:[id number] DOB: 1955/10/09 DOA: 10/15/2020 PCP: Juluis Pitch, MD   Assessment & Plan:   Active Problems:   Syncope   Hypotension and chronic steroid use: AM Cortisol is 2.7 ug/dL , possibly secondary to adrenal insufficiency but ACTH stimulation test was only 10.2 at 60 mins and so adrenal insufficiency is r/o   Acute recurrent syncope:  etiology unclear, possibly vasovagal. Echo shows EF 45-50%. MRI of brain showed no acute abnormality. F/u with cardiology as outpt   Abdominal bloating: has periportal edema & mesenteric stranding w/ trace ascites, possibly related to dialysis dependent ESRD. Continue on simethicone    Poor oral intake: continue on remeron. PEG clogged and fixed as per IR    Likely ACD: s/p 1 unit of pRBCS transfused on 10/16/20. Continue to hold aspirin, plavix.    ESRD: on HD TTS. Management per nephro    Acute metabolic encephalopathy: uremia vs hepatic encephalopathy. Continue to hold home dose of gabapentin, lexapro.   DM2: likely poorly controlled. Continue on SSI w/ accuchecks  Liver transplant: f/u hepatology outpatient   Leukopenia: etiology unclear, possibly to chronic immunosuppression meds. Will continue to monitor    Hx of CAD: continue to hold home dose of aspirin, plavix       DVT prophylaxis: heparin  Code Status: full Family Communication:  Disposition Plan: depends on PT/OT recs   Level of care: Med-Surg  Status is: Inpatient  Remains inpatient appropriate because:IV treatments appropriate due to intensity of illness or inability to take PO and Inpatient level of care appropriate due to severity of illness  Dispo: The patient is from: home              Anticipated d/c is to: Home vs SNF              Patient currently is not medically stable to d/c.   Difficult to place patient : unclear        Consultants:  IR  Procedures:   Antimicrobials:    Subjective: Pt c/o  fatigue  Objective: Vitals:   10/19/20 1730 10/19/20 1955 10/20/20 0500 10/20/20 0748  BP: 132/77 110/63  135/76  Pulse: 72 83  78  Resp: (!) 22 18  17   Temp:  98.2 F (36.8 C)  97.9 F (36.6 C)  TempSrc:      SpO2:  100%  100%  Weight:   74.4 kg   Height:        Intake/Output Summary (Last 24 hours) at 10/20/2020 0833 Last data filed at 10/20/2020 0639 Gross per 24 hour  Intake 4673.18 ml  Output 1300 ml  Net 3373.18 ml   Filed Weights   10/18/20 0253 10/19/20 0653 10/20/20 0500  Weight: 71.6 kg 75.1 kg 74.4 kg    Examination:  General exam: Appears calm and comfortable  Respiratory system: Clear to auscultation. Respiratory effort normal. Cardiovascular system: S1 & S2 +. No , rubs, gallops or clicks.  Gastrointestinal system: Abdomen is nondistended, soft and nontender. Hypoactive bowel sounds heard. Central nervous system: Alert and oriented. Moves all extremities  Psychiatry: Judgement and insight appear normal. Flat mood and affect     Data Reviewed: I have personally reviewed following labs and imaging studies  CBC: Recent Labs  Lab 10/15/20 2024 10/16/20 0539 10/16/20 1009 10/17/20 0611 10/18/20 0626 10/19/20 0523 10/20/20 0415  WBC 2.0* 2.5*  --  2.1* 1.9* 1.8* 1.8*  NEUTROABS 1.1*  --   --  1.0* 1.0* 0.7*  --   HGB 7.8* 6.6* 6.6* 7.2* 7.6* 7.3* 8.1*  HCT 22.5* 19.0* 19.0* 20.7* 21.5* 20.8* 23.2*  MCV 99.6 96.9  --  98.6 96.8 95.4 97.5  PLT 123* 116*  --  112* 132* 125* 947*   Basic Metabolic Panel: Recent Labs  Lab 10/14/20 0444 10/15/20 2024 10/16/20 0539 10/16/20 1009 10/17/20 0611 10/18/20 0626 10/19/20 0523  NA 134* 132* 131* 133* 136 135 136  K 4.0 4.1 4.1 4.1 3.3* 3.9 3.8  CL 95* 92* 94* 96* 99 99 103  CO2 29 27 29 28 31 28 27   GLUCOSE 117* 222* 179* 175* 167* 188* 118*  BUN 38* 69* 69* 70* 34* 45* 42*  CREATININE 2.66* 4.04* 3.90* 4.14* 2.57* 2.85* 2.73*  CALCIUM 8.7* 8.7* 8.6* 8.7* 8.4* 8.5* 8.9  MG 1.6* 1.9  --   --   --    --   --    GFR: Estimated Creatinine Clearance: 28 mL/min (A) (by C-G formula based on SCr of 2.73 mg/dL (H)). Liver Function Tests: Recent Labs  Lab 10/15/20 2024 10/19/20 0523  AST 22 14*  ALT 16 13  ALKPHOS 60 45  BILITOT 0.6 0.7  PROT 6.2* 5.3*  ALBUMIN 3.4* 3.0*   No results for input(s): LIPASE, AMYLASE in the last 168 hours. No results for input(s): AMMONIA in the last 168 hours. Coagulation Profile: No results for input(s): INR, PROTIME in the last 168 hours. Cardiac Enzymes: No results for input(s): CKTOTAL, CKMB, CKMBINDEX, TROPONINI in the last 168 hours. BNP (last 3 results) No results for input(s): PROBNP in the last 8760 hours. HbA1C: No results for input(s): HGBA1C in the last 72 hours. CBG: Recent Labs  Lab 10/19/20 0814 10/19/20 1217 10/19/20 1810 10/19/20 2008 10/20/20 0824  GLUCAP 123* 144* 128* 184* 187*   Lipid Profile: No results for input(s): CHOL, HDL, LDLCALC, TRIG, CHOLHDL, LDLDIRECT in the last 72 hours. Thyroid Function Tests: No results for input(s): TSH, T4TOTAL, FREET4, T3FREE, THYROIDAB in the last 72 hours. Anemia Panel: No results for input(s): VITAMINB12, FOLATE, FERRITIN, TIBC, IRON, RETICCTPCT in the last 72 hours. Sepsis Labs: No results for input(s): PROCALCITON, LATICACIDVEN in the last 168 hours.  Recent Results (from the past 240 hour(s))  Resp Panel by RT-PCR (Flu A&B, Covid) Nasopharyngeal Swab     Status: None   Collection Time: 10/10/20  2:45 PM   Specimen: Nasopharyngeal Swab; Nasopharyngeal(NP) swabs in vial transport medium  Result Value Ref Range Status   SARS Coronavirus 2 by RT PCR NEGATIVE NEGATIVE Final    Comment: (NOTE) SARS-CoV-2 target nucleic acids are NOT DETECTED.  The SARS-CoV-2 RNA is generally detectable in upper respiratory specimens during the acute phase of infection. The lowest concentration of SARS-CoV-2 viral copies this assay can detect is 138 copies/mL. A negative result does not preclude  SARS-Cov-2 infection and should not be used as the sole basis for treatment or other patient management decisions. A negative result may occur with  improper specimen collection/handling, submission of specimen other than nasopharyngeal swab, presence of viral mutation(s) within the areas targeted by this assay, and inadequate number of viral copies(<138 copies/mL). A negative result must be combined with clinical observations, patient history, and epidemiological information. The expected result is Negative.  Fact Sheet for Patients:  EntrepreneurPulse.com.au  Fact Sheet for Healthcare Providers:  IncredibleEmployment.be  This test is no t yet approved or cleared by the [place] FDA and  has been authorized for detection and/or diagnosis  of SARS-CoV-2 by FDA under an Emergency Use Authorization (EUA). This EUA will remain  in effect (meaning this test can be used) for the duration of the COVID-19 declaration under Section 564(b)(1) of the Act, 21 U.S.C.section 360bbb-3(b)(1), unless the authorization is terminated  or revoked sooner.       Influenza A by PCR NEGATIVE NEGATIVE Final   Influenza B by PCR NEGATIVE NEGATIVE Final    Comment: (NOTE) The Xpert Xpress SARS-CoV-2/FLU/RSV plus assay is intended as an aid in the diagnosis of influenza from Nasopharyngeal swab specimens and should not be used as a sole basis for treatment. Nasal washings and aspirates are unacceptable for Xpert Xpress SARS-CoV-2/FLU/RSV testing.  Fact Sheet for Patients: EntrepreneurPulse.com.au  Fact Sheet for Healthcare Providers: IncredibleEmployment.be  This test is not yet approved or cleared by the [place] FDA and has been authorized for detection and/or diagnosis of SARS-CoV-2 by FDA under an Emergency Use Authorization (EUA). This EUA will remain in effect (meaning this test can be used) for the duration of  the COVID-19 declaration under Section 564(b)(1) of the Act, 21 U.S.C. section 360bbb-3(b)(1), unless the authorization is terminated or revoked.  Performed at [facility], [address] [place], [place] [address]   Resp Panel by RT-PCR (Flu A&B, Covid) Nasopharyngeal Swab     Status: None   Collection Time: 10/15/20  8:24 PM   Specimen: Nasopharyngeal Swab; Nasopharyngeal(NP) swabs in vial transport medium  Result Value Ref Range Status   SARS Coronavirus 2 by RT PCR NEGATIVE NEGATIVE Final    Comment: (NOTE) SARS-CoV-2 target nucleic acids are NOT DETECTED.  The SARS-CoV-2 RNA is generally detectable in upper respiratory specimens during the acute phase of infection. The lowest concentration of SARS-CoV-2 viral copies this assay can detect is 138 copies/mL. A negative result does not preclude SARS-Cov-2 infection and should not be used as the sole basis for treatment or other patient management decisions. A negative result may occur with  improper specimen collection/handling, submission of specimen other than nasopharyngeal swab, presence of viral mutation(s) within the areas targeted by this assay, and inadequate number of viral copies(<138 copies/mL). A negative result must be combined with clinical observations, patient history, and epidemiological information. The expected result is Negative.  Fact Sheet for Patients:  EntrepreneurPulse.com.au  Fact Sheet for Healthcare Providers:  IncredibleEmployment.be  This test is no t yet approved or cleared by the [place] FDA and  has been authorized for detection and/or diagnosis of SARS-CoV-2 by FDA under an Emergency Use Authorization (EUA). This EUA will remain  in effect (meaning this test can be used) for the duration of the COVID-19 declaration under Section 564(b)(1) of the Act, 21 U.S.C.section 360bbb-3(b)(1), unless the authorization is terminated  or revoked  sooner.       Influenza A by PCR NEGATIVE NEGATIVE Final   Influenza B by PCR NEGATIVE NEGATIVE Final    Comment: (NOTE) The Xpert Xpress SARS-CoV-2/FLU/RSV plus assay is intended as an aid in the diagnosis of influenza from Nasopharyngeal swab specimens and should not be used as a sole basis for treatment. Nasal washings and aspirates are unacceptable for Xpert Xpress SARS-CoV-2/FLU/RSV testing.  Fact Sheet for Patients: EntrepreneurPulse.com.au  Fact Sheet for Healthcare Providers: IncredibleEmployment.be  This test is not yet approved or cleared by the [place] FDA and has been authorized for detection and/or diagnosis of SARS-CoV-2 by FDA under an Emergency Use Authorization (EUA). This EUA will remain in effect (meaning this test can be used)  for the duration of the COVID-19 declaration under Section 564(b)(1) of the Act, 21 U.S.C. section 360bbb-3(b)(1), unless the authorization is terminated or revoked.  Performed at [facility], [address] [place], [place] [address]   CULTURE, BLOOD (ROUTINE X 2) w Reflex to ID Panel     Status: None (Preliminary result)   Collection Time: 10/18/20  6:26 AM   Specimen: BLOOD  Result Value Ref Range Status   Specimen Description BLOOD RIGHT HAND  Final   Special Requests   Final    BOTTLES DRAWN AEROBIC AND ANAEROBIC Blood Culture adequate volume   Culture   Final    NO GROWTH 2 DAYS Performed at [facility], [address] [place], [place] [address]    Report Status PENDING  Incomplete  CULTURE, BLOOD (ROUTINE X 2) w Reflex to ID Panel     Status: None (Preliminary result)   Collection Time: 10/18/20  6:26 AM   Specimen: BLOOD  Result Value Ref Range Status   Specimen Description BLOOD LEFT ASSIST CONTROL  Final   Special Requests   Final    BOTTLES DRAWN AEROBIC AND ANAEROBIC Blood Culture adequate volume   Culture   Final    NO GROWTH 2 DAYS Performed at  [facility], [address], [place], [place] [address]    Report Status PENDING  Incomplete  Urine Culture     Status: Abnormal   Collection Time: 10/18/20 11:53 AM   Specimen: Urine, Clean Catch  Result Value Ref Range Status   Specimen Description   Final    URINE, CLEAN CATCH Performed at [facility], [address], [place], [place] [address]    Special Requests   Final    NONE Performed at [facility], [address] [place], [place] [address]    Culture MULTIPLE SPECIES PRESENT, SUGGEST RECOLLECTION (A)  Final   Report Status 10/19/2020 FINAL  Final         Radiology Studies: CT ABDOMEN PELVIS WO CONTRAST  Result Date: 10/18/2020 CLINICAL DATA:  Abdominal pain with recurrent nausea and vomiting, history of liver transplantation EXAM: CT ABDOMEN AND PELVIS WITHOUT CONTRAST TECHNIQUE: Multidetector CT imaging of the abdomen and pelvis was performed following the standard protocol without IV contrast. COMPARISON:  October 16, 2019 FINDINGS: Lower chest: Small right greater than left pleural effusions bibasilar atelectasis. Hepatobiliary: Postsurgical changes of prior liver transplant with unremarkable noncontrast appearance of the hepatic parenchyma. Periportal edema. Gallbladder is surgically absent. Although difficult to evaluate on noncontrast examination there is possible prominence of the intrahepatic biliary ducts. Pancreas: Unremarkable noncontrast appearance of the pancreatic parenchyma. No pancreatic ductal dilation. Spleen: Within normal limits. Adrenals/Urinary Tract: Right adrenal thickening without discrete nodularity. Left adrenal gland is unremarkable. No hydronephrosis. No renal, ureteral or bladder calculi visualized. Urinary bladder is grossly unremarkable for degree of distension. Stomach/Bowel: Percutaneous gastrojejunostomy tube. Stomach is grossly unremarkable. No pathologic dilation of small bowel. The appendix appears surgically  absent. Small volume stool in the ascending colon. Gaseous distension of the transverse colon. The descending and sigmoid colon are decompressed. Colonic diverticulosis without findings of acute diverticulitis. No pneumatosis or portal venous gas. Vascular/Lymphatic: Aortic atherosclerosis without aneurysmal dilation. Prominent upper abdominal and retroperitoneal lymph nodes, likely reactive. No pathologically enlarged abdominal or pelvic lymph nodes. Reproductive: Prosthetic enlargement. Other: Mesenteric edema with trace ascites.  No pneumoperitoneum. Musculoskeletal: New superior endplate compression deformity of the L2 vertebral body with approximately 40% height loss multilevel degenerative changes spine.  Degenerative changes bilateral hips and SI joints. IMPRESSION: 1. Postsurgical changes of hepatic transplant with unremarkable noncontrast appearance of the hepatic parenchyma. 2. Possible subtle prominence of the intrahepatic biliary ducts, recommend correlation with laboratory values for obstruction. 3. Periportal edema and mesenteric stranding with trace ascites, possibly related to dialysis dependent end-stage renal disease. 4. Small right greater than left pleural effusions with bibasilar atelectasis. 5. New superior endplate compression deformity of the L2 vertebral body with approximately 40% height loss. 6. Colonic diverticulosis without findings of acute diverticulitis. 7.  Aortic Atherosclerosis (ICD10-I70.0). Electronically Signed   By: Dahlia Bailiff MD   On: 10/18/2020 22:44        Scheduled Meds:  atorvastatin  40 mg Oral QHS   Chlorhexidine Gluconate Cloth  6 each Topical Daily   epoetin (EPOGEN/PROCRIT) injection  10,000 Units Intravenous Q T,Th,Sa-HD   feeding supplement (NEPRO CARB STEADY)  1,190 mL Per Tube Q24H   free water  30 mL Per Tube Q4H   heparin  5,000 Units Subcutaneous Q8H   insulin aspart  0-6 Units Subcutaneous TID WC   megestrol  40 mg Oral Daily   mirtazapine   15 mg Oral QHS   multivitamin  1 tablet Per Tube QHS   mycophenolate  1,000 mg Oral BID   pantoprazole (PROTONIX) IV  40 mg Intravenous Q12H   predniSONE  17.5 mg Oral Daily   simethicone  160 mg Oral QID   sulfamethoxazole-trimethoprim  1 tablet Oral Q M,W,F   tacrolimus  7 mg Oral BID   valGANciclovir  450 mg Oral Once per day on Mon Thu   Continuous Infusions:  sodium chloride Stopped (10/19/20 2243)   ceFEPime (MAXIPIME) IV Stopped (10/19/20 2216)   promethazine (PHENERGAN) injection (IM or IVPB)     vancomycin Stopped (10/19/20 1814)     LOS: 4 days    Time spent: 33 mins     Wyvonnia Dusky, MD [facility] Pager [phone]  If 7PM-7AM, please contact night-coverage 10/20/2020, 8:33 AM

## 2020-10-21 DIAGNOSIS — D638 Anemia in other chronic diseases classified elsewhere: Secondary | ICD-10-CM

## 2020-10-21 LAB — BASIC METABOLIC PANEL
Anion gap: 7 (ref 5–15)
BUN: 40 mg/dL — ABNORMAL HIGH (ref 8–23)
CO2: 29 mmol/L (ref 22–32)
Calcium: 8.6 mg/dL — ABNORMAL LOW (ref 8.9–10.3)
Chloride: 99 mmol/L (ref 98–111)
Creatinine, Ser: 2.08 mg/dL — ABNORMAL HIGH (ref 0.61–1.24)
GFR, Estimated: 34 mL/min — ABNORMAL LOW (ref >60)
Glucose, Bld: 160 mg/dL — ABNORMAL HIGH (ref 70–99)
Potassium: 3.5 mmol/L (ref 3.5–5.1)
Sodium: 135 mmol/L (ref 135–145)

## 2020-10-21 LAB — GLUCOSE, CAPILLARY
Glucose-Capillary: 182 mg/dL — ABNORMAL HIGH (ref 70–99)
Glucose-Capillary: 141 mg/dL — ABNORMAL HIGH (ref 70–99)

## 2020-10-21 LAB — CBC WITH DIFFERENTIAL/PLATELET
Abs Immature Granulocytes: 0.15 10*3/uL — ABNORMAL HIGH (ref 0.00–0.07)
Basophils Absolute: 0 10*3/uL (ref 0.0–0.1)
Basophils Relative: 1 %
Eosinophils Absolute: 0.1 10*3/uL (ref 0.0–0.5)
Eosinophils Relative: 3 %
HCT: 22.5 % — ABNORMAL LOW (ref 39.0–52.0)
Hemoglobin: 7.9 g/dL — ABNORMAL LOW (ref 13.0–17.0)
Immature Granulocytes: 9 %
Lymphocytes Relative: 36 %
Lymphs Abs: 0.7 10*3/uL (ref 0.7–4.0)
MCH: 33.8 pg (ref 26.0–34.0)
MCHC: 35.1 g/dL (ref 30.0–36.0)
MCV: 96.2 fL (ref 80.0–100.0)
Monocytes Absolute: 0.4 10*3/uL (ref 0.1–1.0)
Monocytes Relative: 23 %
Neutro Abs: 0.5 10*3/uL — ABNORMAL LOW (ref 1.7–7.7)
Neutrophils Relative %: 28 %
Platelets: 137 10*3/uL — ABNORMAL LOW (ref 150–400)
RBC: 2.34 MIL/uL — ABNORMAL LOW (ref 4.22–5.81)
RDW: 14.7 % (ref 11.5–15.5)
Smear Review: NORMAL
WBC: 1.8 10*3/uL — ABNORMAL LOW (ref 4.0–10.5)
nRBC: 0 % (ref 0.0–0.2)

## 2020-10-21 LAB — HEPATITIS B DNA, ULTRAQUANTITATIVE, PCR
HBV DNA SERPL PCR-ACNC: NOT DETECTED IU/mL
HBV DNA SERPL PCR-LOG IU: UNDETERMINED log10 IU/mL

## 2020-10-21 LAB — HEPATITIS B SURFACE ANTIBODY, QUANTITATIVE: Hep B S AB Quant (Post): 10.3 m[IU]/mL (ref >9.9)

## 2020-10-21 NOTE — Progress Notes (Signed)
Nutrition Follow-up  DOCUMENTATION CODES:  Not applicable  INTERVENTION:  Recommend continuing TF regimen as ordered: Nocturnal tube feeds of Nepro 1.8 @ 31ml/hr x 14hrs (1800-0800) Free water flushes 91ml q4 hours to maintain tube patency  Regimen provides 2142kcal/day, 96g/day protein and 1098ml/day free water  Rena-vit daily via tube   NUTRITION DIAGNOSIS:  Inadequate oral intake related to chronic illness as evidenced by other (comment) ([name])  GOAL:  Patient will meet greater than or equal to 90% of their needs  MONITOR: PO intake, Labs, Weight trends, TF tolerance, Skin, I & O's  REASON FOR ASSESSMENT:  Consult Enteral/tube feeding initiation and management  ASSESSMENT:  65 y.o. 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  Pt with G-J tube in place since November 2021 (replaced 7/7 secondary to clog). Pt reports that tube was placed r/t weight loss and his inability to eat enough to maintain good nutritional status. Pt reports that he currently uses [facility] renal at home. Pt is on nocturnal feeds of 38ml/hr x 12-13 hrs. Pt reports that he gets a total of 953ml of tube feeds which provides  1900kcal/da and 88g/day protein. Pt reports that he does eat some food by mouth but reports early satiety and inability to eat more than 25% of meals. Pt reports that he does not tolerate supplements as they make him vomit.  7/7 - s/p new 18 Fr balloon retention G-J tube placement with tip positioned in the proximal jejunum  Pt out of room for procedure at the time of visit. Oral intake is poor, but this is typical for pt. TF are well tolerated and providing almost 100% of low end of estimated needs. Will continue as ordered for now and monitor intake trends.   Noted no BM documented in 1w. Bowel regimen ordered prn but not being administered.  Average Meal Intake: 7/10-7/14: 15% intake x 4 recorded meals (0-30%)  Nutritionally Relevant Medications: Scheduled Meds:  atorvastatin  40 mg Oral QHS   feeding supplement (NEPRO CARB STEADY)  1,190 mL Per Tube Q24H   free water  30 mL Per Tube Q4H   insulin aspart  0-6 Units Subcutaneous TID WC   mirtazapine  15 mg Oral QHS   multivitamin  1 tablet Per Tube QHS   pantoprazole (PROTONIX) IV  40 mg Intravenous Q12H   predniSONE  17.5 mg Oral Daily   simethicone  160 mg Oral QID   sulfamethoxazole-trimethoprim  1 tablet Oral Q M,W,F   PRN Meds: ondansetron, promethazine, senna-docusate  Labs Reviewed: BUN 40, creatinine 2.08 SBG ranges from 130-215 mg/dL over the last 24 hours HgbA1c 6.4% (7/4)  NUTRITION - FOCUSED PHYSICAL EXAM: Unable to perform at this time, pt out of room  Diet Order:   Diet Order             Diet - low sodium heart  healthy           Diet Carb Modified           Diet regular Room service appropriate? Yes; Fluid consistency: Thin  Diet effective now                  EDUCATION NEEDS:  Education needs have been addressed  Skin:  Skin Assessment: Reviewed RN Assessment (ecchymosis)  Last BM:  7/9- type 7  Height:  Ht Readings from Last 1 Encounters:  10/15/20 6\' 1"  (1.854 m)    Weight:  Wt  Readings from Last 1 Encounters:  10/21/20 74.3 kg    Ideal Body Weight:  83.6 kg  BMI:  Body mass index is 21.62 kg/m.  Estimated Nutritional Needs:  Kcal:  2200-2500kcal/day Protein:  110-125g/day Fluid:  UOP +1L  Ranell Patrick, RD, LDN Clinical Dietitian Pager on Amion

## 2020-10-21 NOTE — TOC Progression Note (Signed)
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, has transportation  Can afford his medicaitions He is open with [facility] for RN, OT, PT,  He has all the DME he needs at home No additional needs        Expected Discharge Plan and Services                                                 Social Determinants of Health (SDOH) Interventions    Readmission Risk Interventions Readmission Risk Prevention Plan 10/11/2020  Transportation Screening Complete  PCP or Specialist Appt within 3-5 Days Complete  HRI or [place] Complete  Social Work Consult for [place] Planning/Counseling Complete  Palliative Care Screening Not Applicable  Medication Review [employer]) Complete  Some recent data might be hidden

## 2020-10-21 NOTE — Discharge Summary (Signed)
Physician Discharge Summary  Alex Hampton IHK:[id number] DOB: Aug 07, 1955 DOA: 10/15/2020  PCP: Juluis Pitch, MD  Admit date: 10/15/2020 Discharge date: 10/21/2020  Admitted From:  home  Disposition:  home w/ home health  Recommendations for Outpatient Follow-up:  Follow up with PCP in 1-2 weeks F/u w/ liver transplant physician in 1 week as WBC 1.8 which might be secondary to immunosuppression meds  Home Health: yes Equipment/Devices:  Discharge Condition: stable  CODE STATUS: full  Diet recommendation: Heart Healthy / Carb Modified / renal  Brief/Interim Summary: HPI was taken from Dr. Sidney Ace: Alex Hampton is a 65 y.o. Caucasian male with medical history significant for  hypertension, hyperlipidemia, GERD, depression with anxiety, CAD, stent placement, anemia, liver cirrhosis with ascites (s/p of liver transplantation in [facility], on immunosuppressant), ESRD, OSA not on CPAP, s/p of G -tube, who presents with 2 syncopal episodes at home earlier today, preceded by lightheadedness and feeling woozy.  There is one of them that was witnessed by EMS when they attempted to help him to the stretcher.  No chest pain or palpitations or paresthesias or focal muscle weakness.  He was having nausea in the ER without vomiting.  He vomited today twice however.  No bilious vomitus or hematemesis.  He admitted to diarrhea.  He has not been having much p.o. intake with diminished appetite.  Since he went home per his son who was with him in the ER, he has been feeling weak and not keeping food down.  He takes tube feeds overnight and supplements with oral intake during the day.  He gets hemodialysis on Tuesday Thursday and Saturday and has been compliant.    He was recently admitted here from 7/3 till yesterday for uremia and being off dialysis for 2 weeks ED Course: When he came to the ER, blood pressure was 100/82 with otherwise normal vital signs.  Labs revealed mild hyponatremia and hypochloremia,  hyperglycemia and a BN of 69 with creatinine of 4.04.  Albumin was 3.4 with total protein of 6.2.  High sensitive troponin was 37.  CBC showed anemia with hemoglobin 7.8 and hematocrit 22.5 better than yesterday. EKG as reviewed by me : EKG showed normal sinus rhythm with a rate of 67 with left atrial enlargement, Q waves inferiorly and early repolarization. Imaging: He abdomen 1 view x-ray that showed his gastrostomy to the left upper quadrant with no abnormal bowel dilatation yesterday. Portable chest x-ray tonight showed low lung volumes with basilar atelectasis and bandlike opacities in the left midlung that may reflect further atelectasis or scarring.  The patient was given 500 mill IV normal saline bolus and 4 mg of IV Zofran.  He will be admitted to an observation medical monitored bed for further evaluation and management.  As per Dr. Kurtis Hampton: 65 year old male with multiple medical problems including ESRD on HD TTS, Liver Cirrhosis s/p Liver Transplant on immunosuppressants, Severe Protein Calorie Malnutrition with G tube in place who presents to the ED with recurrent nausea and vomiting x 3 days followed by 2 syncopal episodes at home while walking to the bathroom. Son reports patient had multiple bowel movements prior to passing out.  There was no seizure like activity witnessed by the son.  Mr. [name] cannot recall details of the event.  But he denies having any chest pain or shortness of breath.   7/9 1 unit pRBC - Hb 6.6 to 7.2 g/dL. 7/10 MRI of brain showed no acute abnormality. 7/10 Echo showed no RWMA  and EF 45-50%. 7/11 am Cortisol is 2.7 ug/dL 7/12- PEG tube clogged today. CT scan done. Notified Dr. Vicente Hampton GI who will come and fix the [facility]  Hospital course from Dr. Jimmye Norman 7/13-7/14/22: ACTH stimulation was only 10.2 at 60 mins so adrenal insufficiency was r/o. The etiology of the recurrent syncope was unclear, possibly vasovagal. Also, pt PEG was unclogged by IR and was working properly  prior to d/c. Also, pt's WBC was 1.8, possibly secondary to immunosuppression meds and pt will f/u outpatient w/ his liver transplant specialist within 1 week to further assess. Pt was d/c home w/ home health. Pt was already receiving home health prior to admission  Discharge Diagnoses:  Active Problems:   Syncope   Hypotension and chronic steroid use: AM Cortisol is 2.7 ug/dL , possibly secondary to adrenal insufficiency but ACTH stimulation test was only 10.2 at 60 mins and so adrenal insufficiency is r/o. Resolved    Acute recurrent syncope:  etiology unclear, possibly vasovagal. Echo shows EF 45-50%. MRI of brain showed no acute abnormality. F/u with cardiology as outpt   Abdominal bloating: has periportal edema & mesenteric stranding w/ trace ascites, possibly related to dialysis dependent ESRD. Continue on simethicone    Poor oral intake: continue on remeron. 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 H&H are labile. No need for another transfusion currently    ESRD: on HD TTS. Management per nephro    Acute metabolic encephalopathy: uremia vs hepatic encephalopathy.  Back to baseline    DM2: likely poorly controlled. Continue on SSI w/ accuchecks  Liver transplant: f/u hepatology outpatient   Leukopenia: etiology unclear, possibly to chronic immunosuppression meds. Will continue to monitor    Hx of CAD: restart home dose of aspirin, plavix   Discharge Instructions  Discharge Instructions     Diet - low sodium heart healthy   Complete by: As directed    Diet Carb Modified   Complete by: As directed    Discharge instructions   Complete by: As directed    F/u w/ liver transplant physician in 1 week as WBC 1.8, possibly secondary to immunosuppression meds. F/u w/ PCP in 1-2 weeks   Increase activity slowly   Complete by: As directed       Allergies as of 10/21/2020   No Known Allergies      Medication List     TAKE these  medications    acetaminophen 325 MG tablet Commonly known as: TYLENOL Take 650 mg by mouth every 8 (eight) hours as needed for mild pain or moderate pain.   aspirin 81 MG EC tablet Take 81 mg by mouth daily.   atorvastatin 40 MG tablet Commonly known as: LIPITOR Take 40 mg by mouth at bedtime.   clopidogrel 75 MG tablet Commonly known as: PLAVIX Take 1 tablet (75 mg total) by mouth daily.   escitalopram 20 MG tablet Commonly known as: LEXAPRO Take 20 mg by mouth daily.   gabapentin 100 MG capsule Commonly known as: NEURONTIN Take 100 mg by mouth 2 (two) times daily.   hydrOXYzine 25 MG capsule Commonly known as: VISTARIL Take 25 mg by mouth every 6 (six) hours as needed for anxiety or itching.   melatonin 3 MG Tabs tablet Take 9 mg by mouth at bedtime.   metoCLOPramide 5 MG tablet Commonly known as: REGLAN Take 5 mg by mouth 3 (three) times daily before meals.   metoprolol tartrate 25  MG tablet Commonly known as: LOPRESSOR Take 1 tablet (25 mg total) by mouth 2 (two) times daily.   mirtazapine 15 MG tablet Commonly known as: REMERON Take 15 mg by mouth at bedtime.   mycophenolate 250 MG capsule Commonly known as: CELLCEPT Take 1,000 mg by mouth 2 (two) times daily.   nitroGLYCERIN 0.4 MG SL tablet Commonly known as: NITROSTAT Place 1 tablet (0.4 mg total) under the tongue every 5 (five) minutes as needed for chest pain.   NovaSource Renal Liqd Give by tube. Novasource Renal @ 75 ml/hr 8 hours /night (10 pm -8 am)   NovoLIN R 100 units/mL injection Generic drug: insulin regular Inject 0-11 Units into the skin See admin instructions. Take 6 units at 6pm plus correction with meal time. Correction scale with meals.  Glucose Range  <200 none, 201 - 250 mg/dL 1 unit, 251 - 300 mg/dL 2 units, 301 - 350 mg/dL 3 units, 351- 400 mg/dl 4 units , >400 mg/dL take 5 units and call your provider   pantoprazole 40 MG tablet Commonly known as: PROTONIX Take 1 tablet (40 mg  total) by mouth daily.   predniSONE 5 MG tablet Commonly known as: DELTASONE Take 17.5 mg by mouth daily.   senna-docusate 8.6-50 MG tablet Commonly known as: Senokot-S Take 2 tablets by mouth 2 (two) times daily as needed for mild constipation or moderate constipation.   sulfamethoxazole-trimethoprim 800-160 MG tablet Commonly known as: BACTRIM DS Take 1 tablet by mouth every Monday, Wednesday, and Friday.   tacrolimus 1 MG capsule Commonly known as: PROGRAF Take 7 mg by mouth 2 (two) times daily.   traZODone 150 MG tablet Commonly known as: DESYREL Take 150 mg by mouth at bedtime.   valGANciclovir 450 MG tablet Commonly known as: VALCYTE Take 450 mg by mouth 2 (two) times a week. (Monday and Thursday) with largest meal of the day.        No Known Allergies  Consultations: Nephro    Procedures/Studies: CT ABDOMEN PELVIS WO CONTRAST  Result Date: 10/18/2020 CLINICAL DATA:  Abdominal pain with recurrent nausea and vomiting, history of liver transplantation EXAM: CT ABDOMEN AND PELVIS WITHOUT CONTRAST TECHNIQUE: Multidetector CT imaging of the abdomen and pelvis was performed following the standard protocol without IV contrast. COMPARISON:  October 16, 2019 FINDINGS: Lower chest: Small right greater than left pleural effusions bibasilar atelectasis. Hepatobiliary: Postsurgical changes of prior liver transplant with unremarkable noncontrast appearance of the hepatic parenchyma. Periportal edema. Gallbladder is surgically absent. Although difficult to evaluate on noncontrast examination there is possible prominence of the intrahepatic biliary ducts. Pancreas: Unremarkable noncontrast appearance of the pancreatic parenchyma. No pancreatic ductal dilation. Spleen: Within normal limits. Adrenals/Urinary Tract: Right adrenal thickening without discrete nodularity. Left adrenal gland is unremarkable. No hydronephrosis. No renal, ureteral or bladder calculi visualized. Urinary bladder is  grossly unremarkable for degree of distension. Stomach/Bowel: Percutaneous gastrojejunostomy tube. Stomach is grossly unremarkable. No pathologic dilation of small bowel. The appendix appears surgically absent. Small volume stool in the ascending colon. Gaseous distension of the transverse colon. The descending and sigmoid colon are decompressed. Colonic diverticulosis without findings of acute diverticulitis. No pneumatosis or portal venous gas. Vascular/Lymphatic: Aortic atherosclerosis without aneurysmal dilation. Prominent upper abdominal and retroperitoneal lymph nodes, likely reactive. No pathologically enlarged abdominal or pelvic lymph nodes. Reproductive: Prosthetic enlargement. Other: Mesenteric edema with trace ascites.  No pneumoperitoneum. Musculoskeletal: New superior endplate compression deformity of the L2 vertebral body with approximately 40% height loss multilevel degenerative changes spine. Degenerative  changes bilateral hips and SI joints. IMPRESSION: 1. Postsurgical changes of hepatic transplant with unremarkable noncontrast appearance of the hepatic parenchyma. 2. Possible subtle prominence of the intrahepatic biliary ducts, recommend correlation with laboratory values for obstruction. 3. Periportal edema and mesenteric stranding with trace ascites, possibly related to dialysis dependent end-stage renal disease. 4. Small right greater than left pleural effusions with bibasilar atelectasis. 5. New superior endplate compression deformity of the L2 vertebral body with approximately 40% height loss. 6. Colonic diverticulosis without findings of acute diverticulitis. 7.  Aortic Atherosclerosis (ICD10-I70.0). Electronically Signed   By: Dahlia Bailiff MD   On: 10/18/2020 22:44   DG Chest 2 View  Result Date: 10/10/2020 CLINICAL DATA:  Shortness of breath. History of liver transplant 01 March 2020. End-stage renal disease on dialysis. Weakness. EXAM: CHEST - 2 VIEW COMPARISON:  08/19/2020  FINDINGS: Patient has RIGHT-sided dialysis catheter, tip overlying the level of the LOWER superior vena/UPPER RIGHT atrium. Heart size is normal. There has been improvement in aeration of the LOWER lobes since the previous exam. Nearly resolved bilateral pleural effusions. No edema. Partially imaged probable gastrojejunostomy. IMPRESSION: Improved aeration in the LOWER lobes. Minimal scarring or atelectasis persists at the lung bases. Almost completely resolved bilateral pleural effusions. Electronically Signed   By: Nolon Nations M.D.   On: 10/10/2020 15:32   DG Abd 1 View  Result Date: 10/16/2020 CLINICAL DATA:  Nausea. EXAM: ABDOMEN - 1 VIEW COMPARISON:  Plain film of the abdomen dated 10/14/2020. FINDINGS: No dilated large or small bowel loops are identified. Gastrojejunal feeding tube in place. No evidence of renal or ureteral calculi. No evidence of abnormal fluid collection. No acute appearing osseous abnormality. IMPRESSION: Nonobstructive bowel gas pattern. Gastrojejunal feeding tube in place. Electronically Signed   By: Franki Cabot M.D.   On: 10/16/2020 12:11   DG Abd 1 View  Result Date: 10/14/2020 CLINICAL DATA:  Dislodgement of gastrojejunostomy tube. EXAM: ABDOMEN - 1 VIEW COMPARISON:  None. FINDINGS: The bowel gas pattern is normal. Distal tip of gastrostomy tube appears to be in grossly good position in the left upper quadrant. No radio-opaque calculi or other significant radiographic abnormality are seen. IMPRESSION: Gastrostomy tube tip seen in left upper quadrant. No abnormal bowel dilatation is noted. Electronically Signed   By: Marijo Conception M.D.   On: 10/14/2020 13:59   MR BRAIN WO CONTRAST  Result Date: 10/17/2020 CLINICAL DATA:  Neuro deficit, acute stroke suspected.  Dizziness. EXAM: MRI HEAD WITHOUT CONTRAST TECHNIQUE: Multiplanar, multiecho pulse sequences of the brain and surrounding structures were obtained without intravenous contrast. COMPARISON:  MRI October 17, 2019.  FINDINGS: Brain: No acute infarction, hemorrhage, hydrocephalus, extra-axial collection or mass lesion. Mild to moderate scattered small T2/FLAIR hyperintensities within the white matter, which are nonspecific but potentially related to chronic microvascular ischemic disease. Vascular: Major arterial flow voids are maintained at the skull base. Skull and upper cervical spine: Normal marrow signal. Sinuses/Orbits: Visualized sinuses are clear.  Unremarkable orbits. Other: No mastoid effusions. IMPRESSION: 1. No evidence of acute intracranial abnormality. 2. Mild to moderate chronic microvascular ischemic disease. Electronically Signed   By: Margaretha Sheffield MD   On: 10/17/2020 12:26   US Carotid Bilateral  Result Date: 10/16/2020 CLINICAL DATA:  65 year old male with syncope. EXAM: BILATERAL CAROTID DUPLEX ULTRASOUND TECHNIQUE: Pearline Cables scale imaging, color Doppler and duplex ultrasound were performed of bilateral carotid and vertebral arteries in the neck. COMPARISON:  None. FINDINGS: Criteria: Quantification of carotid stenosis is based on velocity parameters  that correlate the residual internal carotid diameter with NASCET-based stenosis levels, using the diameter of the distal internal carotid lumen as the denominator for stenosis measurement. The following velocity measurements were obtained: RIGHT ICA: 64 cm/sec CCA: 76 cm/sec SYSTOLIC ICA/CCA RATIO:  0.8 ECA: 120 cm/sec LEFT ICA: 79 cm/sec CCA: 85 cm/sec SYSTOLIC ICA/CCA RATIO:  0.9 ECA: 171 cm/sec RIGHT CAROTID ARTERY: There is plaque involving the carotid bulb and proximal ICA. RIGHT VERTEBRAL ARTERY:  Antegrade flow. LEFT CAROTID ARTERY: There is plaque involving the carotid bulb and proximal ICA. LEFT VERTEBRAL ARTERY:  Antegrade flow. IMPRESSION: Less than 50% ICA stenosis bilaterally. Electronically Signed   By: Anner Crete M.D.   On: 10/16/2020 01:09   IR Replc Gastro/Colonic Tube Percut W/Fluoro  Result Date: 10/14/2020 INDICATION: History of  liver transplantation and placement of percutaneous gastrojejunal feeding tube due to lack of adequate oral intake. An indwelling 67 French balloon retention gastrojejunal tube has become completely occluded and cannot be successfully de-clogged. EXAM: REPLACEMENT OF GASTROJEJUNAL FEEDING TUBE UNDER FLUOROSCOPY MEDICATIONS: None ANESTHESIA/SEDATION: None CONTRAST:  10 mL Visipaque 320 FLUOROSCOPY TIME:  Fluoroscopy Time: 1 minute and 6 seconds. 7.7 mGy. COMPLICATIONS: None immediate. PROCEDURE: Informed written consent was obtained from the patient after a thorough discussion of the procedural risks, benefits and alternatives. All questions were addressed. Maximal Sterile Barrier Technique was utilized including caps, mask, sterile gowns, sterile gloves, sterile drape, hand hygiene and skin antiseptic. A timeout was performed prior to the initiation of the procedure. The pre-existing 62 French gastrojejunal tube was retracted after deflation of the retention balloon. The tube was cut and distal segment removed over a guidewire. Guidewire access was advanced under fluoroscopy utilizing a 5 French catheter through the stomach and duodenum and into the jejunum. A new 28 French balloon retention gastrojejunal catheter was then advanced over the wire under fluoroscopy. Catheter position was confirmed by fluoroscopy after contrast injection into the jejunal lumen. Fluoroscopic images were saved. The retention balloon in the stomach was inflated with 10 mL of saline. FINDINGS: After replacement, the new gastrojejunal catheter extends into the proximal jejunum. The catheter is ready for immediate use. IMPRESSION: Replacement of occluded 18 French balloon retention gastrojejunal tube for new 18 French catheter. The new catheter was advanced into the proximal jejunum. Electronically Signed   By: Aletta Edouard M.D.   On: 10/14/2020 16:34   ECHOCARDIOGRAM COMPLETE  Result Date: 10/18/2020    ECHOCARDIOGRAM REPORT    Patient Name:   KENRICK PORE Date of Exam: 10/16/2020 Medical Rec #:  [id number]     Height:       73.0 in Accession #:    [id number]    Weight:       153.7 lb Date of Birth:  08-20-1955      BSA:          1.924 m Patient Age:    26 years      BP:           93/63 mmHg Patient Gender: M             HR:           58 bpm. Exam Location:  [facility] Procedure: 2D Echo Indications:     Syncope R55  History:         Patient has prior history of Echocardiogram examinations, most                  recent 10/17/2019.  Sonographer:  Kathlen Brunswick RDCS Referring Phys:  [id number] [place]night Diagnosing Phys: Serafina Royals MD  Sonographer Comments: Technically challenging study due to limited acoustic windows, Technically difficult study due to poor echo windows, no apical window, no subcostal window and suboptimal parasternal window. Image acquisition challenging due to patient body habitus. IMPRESSIONS  1. Left ventricular ejection fraction, by estimation, is 45 to 50%. The left ventricle has mildly decreased function. The left ventricle has no regional wall motion abnormalities. Left ventricular diastolic function could not be evaluated.  2. Right ventricular systolic function is normal. The right ventricular size is normal.  3. The mitral valve is normal in structure. Mild mitral valve regurgitation.  4. The aortic valve is normal in structure. Aortic valve regurgitation is not visualized. FINDINGS  Left Ventricle: Left ventricular ejection fraction, by estimation, is 45 to 50%. The left ventricle has mildly decreased function. The left ventricle has no regional wall motion abnormalities. The left ventricular internal cavity size was normal in size. There is no left ventricular hypertrophy. Left ventricular diastolic function could not be evaluated. Right Ventricle: The right ventricular size is normal. No increase in right ventricular wall thickness. Right ventricular systolic function is normal. Left Atrium: Left atrial size was  normal in size. Right Atrium: Right atrial size was normal in size. Pericardium: There is no evidence of pericardial effusion. Mitral Valve: The mitral valve is normal in structure. Mild mitral valve regurgitation. Tricuspid Valve: The tricuspid valve is normal in structure. Tricuspid valve regurgitation is trivial. Aortic Valve: The aortic valve is normal in structure. Aortic valve regurgitation is not visualized. Pulmonic Valve: The pulmonic valve was normal in structure. Pulmonic valve regurgitation is not visualized. Aorta: The aortic root and ascending aorta are structurally normal, with no evidence of dilitation. IAS/Shunts: No atrial level shunt detected by color flow Doppler.  LEFT VENTRICLE PLAX 2D LVIDd:         4.41 cm LVIDs:         3.11 cm LV PW:         1.15 cm LV IVS:        1.24 cm LVOT diam:     2.10 cm LVOT Area:     3.46 cm  LEFT ATRIUM         Index LA diam:    2.90 cm 1.51 cm/m   AORTA Ao Root diam: 3.20 cm Ao Asc diam:  3.70 cm  SHUNTS Systemic Diam: 2.10 cm Serafina Royals MD Electronically signed by Serafina Royals MD Signature Date/Time: 10/18/2020/8:25:49 AM    Final    (Echo, Carotid, EGD, Colonoscopy, ERCP)    Subjective: Pt c/o fatigue    Discharge Exam: Vitals:   10/21/20 1230 10/21/20 1327  BP: 115/78 115/68  Pulse:  85  Resp: 17 16  Temp:  98.7 F (37.1 C)  SpO2:  100%   Vitals:   10/21/20 1200 10/21/20 1215 10/21/20 1230 10/21/20 1327  BP: 114/75 127/65 115/78 115/68  Pulse: 83 83  85  Resp: 19 12 17 16   Temp:    98.7 F (37.1 C)  TempSrc:    Oral  SpO2:    100%  Weight:      Height:        General: Pt is alert, awake, not in acute distress Cardiovascular: S1/S2 +, no rubs, no gallops Respiratory: CTA bilaterally, no wheezing, no rhonchi Abdominal: Soft, NT, ND, bowel sounds + Extremities: no cyanosis    The results of significant diagnostics from this hospitalization (  including imaging, microbiology, ancillary and laboratory) are listed below for  reference.     Microbiology: Recent Results (from the past 240 hour(s))  Resp Panel by RT-PCR (Flu A&B, Covid) Nasopharyngeal Swab     Status: None   Collection Time: 10/15/20  8:24 PM   Specimen: Nasopharyngeal Swab; Nasopharyngeal(NP) swabs in vial transport medium  Result Value Ref Range Status   SARS Coronavirus 2 by RT PCR NEGATIVE NEGATIVE Final    Comment: (NOTE) SARS-CoV-2 target nucleic acids are NOT DETECTED.  The SARS-CoV-2 RNA is generally detectable in upper respiratory specimens during the acute phase of infection. The lowest concentration of SARS-CoV-2 viral copies this assay can detect is 138 copies/mL. A negative result does not preclude SARS-Cov-2 infection and should not be used as the sole basis for treatment or other patient management decisions. A negative result may occur with  improper specimen collection/handling, submission of specimen other than nasopharyngeal swab, presence of viral mutation(s) within the areas targeted by this assay, and inadequate number of viral copies(<138 copies/mL). A negative result must be combined with clinical observations, patient history, and epidemiological information. The expected result is Negative.  Fact Sheet for Patients:  EntrepreneurPulse.com.au  Fact Sheet for Healthcare Providers:  IncredibleEmployment.be  This test is no t yet approved or cleared by the [place] FDA and  has been authorized for detection and/or diagnosis of SARS-CoV-2 by FDA under an Emergency Use Authorization (EUA). This EUA will remain  in effect (meaning this test can be used) for the duration of the COVID-19 declaration under Section 564(b)(1) of the Act, 21 U.S.C.section 360bbb-3(b)(1), unless the authorization is terminated  or revoked sooner.       Influenza A by PCR NEGATIVE NEGATIVE Final   Influenza B by PCR NEGATIVE NEGATIVE Final    Comment: (NOTE) The Xpert Xpress SARS-CoV-2/FLU/RSV  plus assay is intended as an aid in the diagnosis of influenza from Nasopharyngeal swab specimens and should not be used as a sole basis for treatment. Nasal washings and aspirates are unacceptable for Xpert Xpress SARS-CoV-2/FLU/RSV testing.  Fact Sheet for Patients: EntrepreneurPulse.com.au  Fact Sheet for Healthcare Providers: IncredibleEmployment.be  This test is not yet approved or cleared by the [place] FDA and has been authorized for detection and/or diagnosis of SARS-CoV-2 by FDA under an Emergency Use Authorization (EUA). This EUA will remain in effect (meaning this test can be used) for the duration of the COVID-19 declaration under Section 564(b)(1) of the Act, 21 U.S.C. section 360bbb-3(b)(1), unless the authorization is terminated or revoked.  Performed at [facility], [address] [place], [place] [id number]   CULTURE, BLOOD (ROUTINE X 2) w Reflex to ID Panel     Status: None (Preliminary result)   Collection Time: 10/18/20  6:26 AM   Specimen: BLOOD  Result Value Ref Range Status   Specimen Description BLOOD RIGHT HAND  Final   Special Requests   Final    BOTTLES DRAWN AEROBIC AND ANAEROBIC Blood Culture adequate volume   Culture   Final    NO GROWTH 3 DAYS Performed at [facility], [address] [place], [place] [address]    Report Status PENDING  Incomplete  CULTURE, BLOOD (ROUTINE X 2) w Reflex to ID Panel     Status: None (Preliminary result)   Collection Time: 10/18/20  6:26 AM   Specimen: BLOOD  Result Value Ref Range Status   Specimen Description BLOOD LEFT ASSIST CONTROL  Final   Special Requests  Final    BOTTLES DRAWN AEROBIC AND ANAEROBIC Blood Culture adequate volume   Culture   Final    NO GROWTH 3 DAYS Performed at [facility], [address] [place], [place] [address]    Report Status PENDING  Incomplete  Urine Culture     Status: Abnormal   Collection Time:  10/18/20 11:53 AM   Specimen: Urine, Clean Catch  Result Value Ref Range Status   Specimen Description   Final    URINE, CLEAN CATCH Performed at [facility], [address] [place], [place] [address]    Special Requests   Final    NONE Performed at [facility] [address] [place], [place] [address]    Culture MULTIPLE SPECIES PRESENT, SUGGEST RECOLLECTION (A)  Final   Report Status 10/19/2020 FINAL  Final     Labs: BNP (last 3 results) Recent Labs    12/10/19 0802 08/19/20 0643 10/10/20 1016  BNP 74.3 561.3* 626.9*   Basic Metabolic Panel: Recent Labs  Lab 10/15/20 2024 10/16/20 0539 10/16/20 1009 10/17/20 0611 10/18/20 0626 10/19/20 0523 10/21/20 0630  NA 132*   < > 133* 136 135 136 135  K 4.1   < > 4.1 3.3* 3.9 3.8 3.5  CL 92*   < > 96* 99 99 103 99  CO2 27   < > 28 31 28 27 29   GLUCOSE 222*   < > 175* 167* 188* 118* 160*  BUN 69*   < > 70* 34* 45* 42* 40*  CREATININE 4.04*   < > 4.14* 2.57* 2.85* 2.73* 2.08*  CALCIUM 8.7*   < > 8.7* 8.4* 8.5* 8.9 8.6*  MG 1.9  --   --   --   --   --   --    < > = values in this interval not displayed.   Liver Function Tests: Recent Labs  Lab 10/15/20 2024 10/19/20 0523  AST 22 14*  ALT 16 13  ALKPHOS 60 45  BILITOT 0.6 0.7  PROT 6.2* 5.3*  ALBUMIN 3.4* 3.0*   No results for input(s): LIPASE, AMYLASE in the last 168 hours. No results for input(s): AMMONIA in the last 168 hours. CBC: Recent Labs  Lab 10/15/20 2024 10/16/20 0539 10/17/20 0611 10/18/20 0626 10/19/20 0523 10/20/20 0415 10/21/20 0630  WBC 2.0*   < > 2.1* 1.9* 1.8* 1.8* 1.8*  NEUTROABS 1.1*  --  1.0* 1.0* 0.7*  --  0.5*  HGB 7.8*   < > 7.2* 7.6* 7.3* 8.1* 7.9*  HCT 22.5*   < > 20.7* 21.5* 20.8* 23.2* 22.5*  MCV 99.6   < > 98.6 96.8 95.4 97.5 96.2  PLT 123*   < > 112* 132* 125* 127* 137*   < > = values in this interval not displayed.   Cardiac Enzymes: No results for input(s): CKTOTAL, CKMB, CKMBINDEX, TROPONINI in  the last 168 hours. BNP: Invalid input(s): POCBNP CBG: Recent Labs  Lab 10/20/20 0824 10/20/20 1130 10/20/20 1543 10/21/20 0109 10/21/20 0727  GLUCAP 187* 130* 215* 182* 141*   D-Dimer No results for input(s): DDIMER in the last 72 hours. Hgb A1c No results for input(s): HGBA1C in the last 72 hours. Lipid Profile No results for input(s): CHOL, HDL, LDLCALC, TRIG, CHOLHDL, LDLDIRECT in the last 72 hours. Thyroid function studies No results for input(s): TSH, T4TOTAL, T3FREE, THYROIDAB in the last 72 hours.  Invalid input(s): FREET3 Anemia work up No results for input(s): VITAMINB12, FOLATE, FERRITIN, TIBC,  IRON, RETICCTPCT in the last 72 hours. Urinalysis    Component Value Date/Time   COLORURINE STRAW (A) 10/18/2020 1153   APPEARANCEUR CLEAR (A) 10/18/2020 1153   LABSPEC 1.010 10/18/2020 1153   PHURINE 7.0 10/18/2020 1153   GLUCOSEU NEGATIVE 10/18/2020 1153   HGBUR NEGATIVE 10/18/2020 1153   [place] 10/18/2020 1153   [place] 10/18/2020 1153   PROTEINUR NEGATIVE 10/18/2020 1153   NITRITE NEGATIVE 10/18/2020 1153   LEUKOCYTESUR NEGATIVE 10/18/2020 1153   Sepsis Labs Invalid input(s): PROCALCITONIN,  WBC,  LACTICIDVEN Microbiology Recent Results (from the past 240 hour(s))  Resp Panel by RT-PCR (Flu A&B, Covid) Nasopharyngeal Swab     Status: None   Collection Time: 10/15/20  8:24 PM   Specimen: Nasopharyngeal Swab; Nasopharyngeal(NP) swabs in vial transport medium  Result Value Ref Range Status   SARS Coronavirus 2 by RT PCR NEGATIVE NEGATIVE Final    Comment: (NOTE) SARS-CoV-2 target nucleic acids are NOT DETECTED.  The SARS-CoV-2 RNA is generally detectable in upper respiratory specimens during the acute phase of infection. The lowest concentration of SARS-CoV-2 viral copies this assay can detect is 138 copies/mL. A negative result does not preclude SARS-Cov-2 infection and should not be used as the sole basis for treatment or other  patient management decisions. A negative result may occur with  improper specimen collection/handling, submission of specimen other than nasopharyngeal swab, presence of viral mutation(s) within the areas targeted by this assay, and inadequate number of viral copies(<138 copies/mL). A negative result must be combined with clinical observations, patient history, and epidemiological information. The expected result is Negative.  Fact Sheet for Patients:  EntrepreneurPulse.com.au  Fact Sheet for Healthcare Providers:  IncredibleEmployment.be  This test is no t yet approved or cleared by the [place] FDA and  has been authorized for detection and/or diagnosis of SARS-CoV-2 by FDA under an Emergency Use Authorization (EUA). This EUA will remain  in effect (meaning this test can be used) for the duration of the COVID-19 declaration under Section 564(b)(1) of the Act, 21 U.S.C.section 360bbb-3(b)(1), unless the authorization is terminated  or revoked sooner.       Influenza A by PCR NEGATIVE NEGATIVE Final   Influenza B by PCR NEGATIVE NEGATIVE Final    Comment: (NOTE) The Xpert Xpress SARS-CoV-2/FLU/RSV plus assay is intended as an aid in the diagnosis of influenza from Nasopharyngeal swab specimens and should not be used as a sole basis for treatment. Nasal washings and aspirates are unacceptable for Xpert Xpress SARS-CoV-2/FLU/RSV testing.  Fact Sheet for Patients: EntrepreneurPulse.com.au  Fact Sheet for Healthcare Providers: IncredibleEmployment.be  This test is not yet approved or cleared by the [place] FDA and has been authorized for detection and/or diagnosis of SARS-CoV-2 by FDA under an Emergency Use Authorization (EUA). This EUA will remain in effect (meaning this test can be used) for the duration of the COVID-19 declaration under Section 564(b)(1) of the Act, 21 U.S.C. section  360bbb-3(b)(1), unless the authorization is terminated or revoked.  Performed at [facility], [address] [place], [place] [address]   CULTURE, BLOOD (ROUTINE X 2) w Reflex to ID Panel     Status: None (Preliminary result)   Collection Time: 10/18/20  6:26 AM   Specimen: BLOOD  Result Value Ref Range Status   Specimen Description BLOOD RIGHT HAND  Final   Special Requests   Final    BOTTLES DRAWN AEROBIC AND ANAEROBIC Blood Culture adequate volume   Culture   Final  NO GROWTH 3 DAYS Performed at [facility], [address] [place], [place] [address]    Report Status PENDING  Incomplete  CULTURE, BLOOD (ROUTINE X 2) w Reflex to ID Panel     Status: None (Preliminary result)   Collection Time: 10/18/20  6:26 AM   Specimen: BLOOD  Result Value Ref Range Status   Specimen Description BLOOD LEFT ASSIST CONTROL  Final   Special Requests   Final    BOTTLES DRAWN AEROBIC AND ANAEROBIC Blood Culture adequate volume   Culture   Final    NO GROWTH 3 DAYS Performed at [facility], [address], [place], [place] [address]    Report Status PENDING  Incomplete  Urine Culture     Status: Abnormal   Collection Time: 10/18/20 11:53 AM   Specimen: Urine, Clean Catch  Result Value Ref Range Status   Specimen Description   Final    URINE, CLEAN CATCH Performed at [facility], [address] [place], [place] [address]    Special Requests   Final    NONE Performed at [facility], [address], [place],  [address]    Culture MULTIPLE SPECIES PRESENT, SUGGEST RECOLLECTION (A)  Final   Report Status 10/19/2020 FINAL  Final     Time coordinating discharge: Over 30 minutes  SIGNED:   Wyvonnia Dusky, MD  [facility] 10/21/2020, 1:56 PM Pager   If 7PM-7AM, please contact night-coverage [email or web address]

## 2020-10-21 NOTE — Progress Notes (Signed)
[facility]  ROUNDING NOTE   Subjective:   Patient seen during dialysis   HEMODIALYSIS FLOWSHEET:  Blood Flow Rate (mL/min): 400 mL/min Arterial Pressure (mmHg): -140 mmHg Venous Pressure (mmHg): 100 mmHg Transmembrane Pressure (mmHg): 60 mmHg Ultrafiltration Rate (mL/min): 500 mL/min Dialysate Flow Rate (mL/min): 500 ml/min Conductivity: Machine : 13.7 Conductivity: Machine : 13.7 Dialysis Fluid Bolus: Normal Saline Bolus Amount (mL): 300 mL  No complaints at this time Tolerating tube feeds  Objective:  Vital signs in last 24 hours:  Temp:  [97.9 F (36.6 C)-98.9 F (37.2 C)] 98.3 F (36.8 C) (07/14 0944) Pulse Rate:  [73-101] 88 (07/14 1100) Resp:  [16-20] 19 (07/14 1100) BP: (120-145)/(72-83) 132/72 (07/14 1100) SpO2:  [99 %-100 %] 100 % (07/14 0944) Weight:  [74.3 kg] 74.3 kg (07/14 0521)  Weight change: -0.045 kg Filed Weights   10/19/20 0653 10/20/20 0500 10/21/20 0521  Weight: 75.1 kg 74.4 kg 74.3 kg    Intake/Output: I/O last 3 completed shifts: In: 1154.9 [I.V.:4.9; Other:30; NG/GT:1020; IV Piggyback:100] Out: 550 [Urine:550]   Intake/Output this shift:  No intake/output data recorded.  Physical Exam: General: NAD, laying in bed  Head: Normocephalic, atraumatic. Dry oral mucosal membranes  Eyes: Anicteric  Lungs:  Clear to auscultation, normal effort  Heart: Regular rate and rhythm  Abdomen:  Soft, nontender, GJ tube  Extremities: no peripheral edema.  Neurologic: Nonfocal, moving all four extremities  Skin: No lesions  Access: RIJ permcath    Basic Metabolic Panel: Recent Labs  Lab 10/15/20 2024 10/16/20 0539 10/16/20 1009 10/17/20 0611 10/18/20 0626 10/19/20 0523 10/21/20 0630  NA 132*   < > 133* 136 135 136 135  K 4.1   < > 4.1 3.3* 3.9 3.8 3.5  CL 92*   < > 96* 99 99 103 99  CO2 27   < > 28 31 28 27 29   GLUCOSE 222*   < > 175* 167* 188* 118* 160*  BUN 69*   < > 70* 34* 45* 42* 40*  CREATININE 4.04*   < > 4.14*  2.57* 2.85* 2.73* 2.08*  CALCIUM 8.7*   < > 8.7* 8.4* 8.5* 8.9 8.6*  MG 1.9  --   --   --   --   --   --    < > = values in this interval not displayed.     Liver Function Tests: Recent Labs  Lab 10/15/20 2024 10/19/20 0523  AST 22 14*  ALT 16 13  ALKPHOS 60 45  BILITOT 0.6 0.7  PROT 6.2* 5.3*  ALBUMIN 3.4* 3.0*    No results for input(s): LIPASE, AMYLASE in the last 168 hours. No results for input(s): AMMONIA in the last 168 hours.   CBC: Recent Labs  Lab 10/15/20 2024 10/16/20 0539 10/17/20 0611 10/18/20 0626 10/19/20 0523 10/20/20 0415 10/21/20 0630  WBC 2.0*   < > 2.1* 1.9* 1.8* 1.8* 1.8*  NEUTROABS 1.1*  --  1.0* 1.0* 0.7*  --  0.5*  HGB 7.8*   < > 7.2* 7.6* 7.3* 8.1* 7.9*  HCT 22.5*   < > 20.7* 21.5* 20.8* 23.2* 22.5*  MCV 99.6   < > 98.6 96.8 95.4 97.5 96.2  PLT 123*   < > 112* 132* 125* 127* 137*   < > = values in this interval not displayed.     Cardiac Enzymes: No results for input(s): CKTOTAL, CKMB, CKMBINDEX, TROPONINI in the last 168 hours.  BNP: Invalid input(s): POCBNP  CBG: Recent Labs  Lab 10/20/20 0824 10/20/20 1130 10/20/20 1543 10/21/20 0109 10/21/20 0727  GLUCAP 187* 130* 215* 182* 141*     Microbiology: Results for orders placed or performed during the hospital encounter of 10/15/20  Resp Panel by RT-PCR (Flu A&B, Covid) Nasopharyngeal Swab     Status: None   Collection Time: 10/15/20  8:24 PM   Specimen: Nasopharyngeal Swab; Nasopharyngeal(NP) swabs in vial transport medium  Result Value Ref Range Status   SARS Coronavirus 2 by RT PCR NEGATIVE NEGATIVE Final    Comment: (NOTE) SARS-CoV-2 target nucleic acids are NOT DETECTED.  The SARS-CoV-2 RNA is generally detectable in upper respiratory specimens during the acute phase of infection. The lowest concentration of SARS-CoV-2 viral copies this assay can detect is 138 copies/mL. A negative result does not preclude SARS-Cov-2 infection and should not be used as the sole  basis for treatment or other patient management decisions. A negative result may occur with  improper specimen collection/handling, submission of specimen other than nasopharyngeal swab, presence of viral mutation(s) within the areas targeted by this assay, and inadequate number of viral copies(<138 copies/mL). A negative result must be combined with clinical observations, patient history, and epidemiological information. The expected result is Negative.  Fact Sheet for Patients:  EntrepreneurPulse.com.au  Fact Sheet for Healthcare Providers:  IncredibleEmployment.be  This test is no t yet approved or cleared by the [place] FDA and  has been authorized for detection and/or diagnosis of SARS-CoV-2 by FDA under an Emergency Use Authorization (EUA). This EUA will remain  in effect (meaning this test can be used) for the duration of the COVID-19 declaration under Section 564(b)(1) of the Act, 21 U.S.C.section 360bbb-3(b)(1), unless the authorization is terminated  or revoked sooner.       Influenza A by PCR NEGATIVE NEGATIVE Final   Influenza B by PCR NEGATIVE NEGATIVE Final    Comment: (NOTE) The Xpert Xpress SARS-CoV-2/FLU/RSV plus assay is intended as an aid in the diagnosis of influenza from Nasopharyngeal swab specimens and should not be used as a sole basis for treatment. Nasal washings and aspirates are unacceptable for Xpert Xpress SARS-CoV-2/FLU/RSV testing.  Fact Sheet for Patients: EntrepreneurPulse.com.au  Fact Sheet for Healthcare Providers: IncredibleEmployment.be  This test is not yet approved or cleared by the [place] FDA and has been authorized for detection and/or diagnosis of SARS-CoV-2 by FDA under an Emergency Use Authorization (EUA). This EUA will remain in effect (meaning this test can be used) for the duration of the COVID-19 declaration under Section 564(b)(1) of the Act,  21 U.S.C. section 360bbb-3(b)(1), unless the authorization is terminated or revoked.  Performed at [facility], [address] [place], [place] [address]   CULTURE, BLOOD (ROUTINE X 2) w Reflex to ID Panel     Status: None (Preliminary result)   Collection Time: 10/18/20  6:26 AM   Specimen: BLOOD  Result Value Ref Range Status   Specimen Description BLOOD RIGHT HAND  Final   Special Requests   Final    BOTTLES DRAWN AEROBIC AND ANAEROBIC Blood Culture adequate volume   Culture   Final    NO GROWTH 3 DAYS Performed at [facility], [address] [place], [place] [address]    Report Status PENDING  Incomplete  CULTURE, BLOOD (ROUTINE X 2) w Reflex to ID Panel     Status: None (Preliminary result)   Collection Time: 10/18/20  6:26 AM   Specimen: BLOOD  Result Value Ref Range Status   Specimen Description  BLOOD LEFT ASSIST CONTROL  Final   Special Requests   Final    BOTTLES DRAWN AEROBIC AND ANAEROBIC Blood Culture adequate volume   Culture   Final    NO GROWTH 3 DAYS Performed at [facility], [address] [place], [place] [address]    Report Status PENDING  Incomplete  Urine Culture     Status: Abnormal   Collection Time: 10/18/20 11:53 AM   Specimen: Urine, Clean Catch  Result Value Ref Range Status   Specimen Description   Final    URINE, CLEAN CATCH Performed at [facility], [address] [place], [place] [address]    Special Requests   Final    NONE Performed at [facility], [address] [place], [place] [address]    Culture MULTIPLE SPECIES PRESENT, SUGGEST RECOLLECTION (A)  Final   Report Status 10/19/2020 FINAL  Final    Coagulation Studies: No results for input(s): LABPROT, INR in the last 72 hours.  Urinalysis: Recent Labs    10/18/20 1153  COLORURINE STRAW*  LABSPEC 1.010  PHURINE 7.0  GLUCOSEU NEGATIVE  HGBUR NEGATIVE  BILIRUBINUR NEGATIVE  KETONESUR NEGATIVE  PROTEINUR NEGATIVE   NITRITE NEGATIVE  LEUKOCYTESUR NEGATIVE       Imaging: No results found.   Medications:    sodium chloride Stopped (10/20/20 2320)   ceFEPime (MAXIPIME) IV Stopped (10/20/20 2320)   promethazine (PHENERGAN) injection (IM or IVPB)     vancomycin Stopped (10/19/20 1814)    atorvastatin  40 mg Oral QHS   Chlorhexidine Gluconate Cloth  6 each Topical Daily   epoetin (EPOGEN/PROCRIT) injection  10,000 Units Intravenous Q T,Th,Sa-HD   feeding supplement (NEPRO CARB STEADY)  1,190 mL Per Tube Q24H   free water  30 mL Per Tube Q4H   heparin  5,000 Units Subcutaneous Q8H   insulin aspart  0-6 Units Subcutaneous TID WC   mirtazapine  15 mg Oral QHS   multivitamin  1 tablet Per Tube QHS   mycophenolate  1,000 mg Oral BID   pantoprazole (PROTONIX) IV  40 mg Intravenous Q12H   predniSONE  17.5 mg Oral Daily   simethicone  160 mg Oral QID   sulfamethoxazole-trimethoprim  1 tablet Oral Q M,W,F   tacrolimus  7 mg Oral BID   valGANciclovir  450 mg Oral Once per day on Mon Thu   sodium chloride, acetaminophen **OR** acetaminophen, hydrOXYzine, nitroGLYCERIN, ondansetron **OR** ondansetron (ZOFRAN) IV, promethazine (PHENERGAN) injection (IM or IVPB), senna-docusate, traZODone  Assessment/ Plan:  Alex Hampton is a 65 y.o. white male with end stager renal disease, status post liver transplant, hyperlipidemia, hypertension, coronary artery disease, depression who is re-admitted to [facility] on 10/15/2020 for Syncope and collapse [R55] Syncope [R55]  [facility] TTS [address] RIJ permcath 69kg  End Stage renal disease: Continue TTS schedule. Receiving dialysis today. If discharged, he can continue treatments at outpatient clinic  Hypotension: 132/72 - at goal.  Holding metoprolol.    Anemia of chronic kidney disease: hemoglobin 7.9  - PRBC transfusion on 7/9.  -  Continue EPO with HD treatments  Secondary Hyperparathyroidism: calcium and phosphorus at goal. Not currently on binders.      LOS: 5 Alex Hampton 7/14/202211:15 AM

## 2020-10-21 NOTE — Progress Notes (Signed)
PT Cancellation Note  Patient Details Name: FAHAD CISSE MRN: [id number] DOB: [birth year]   Cancelled Treatment:    Reason Eval/Treat Not Completed: Patient at procedure or test/unavailable. Pt off floor for Hemodialysis. Will re-attempt at later date.   Salem Caster. Fairly IV, PT, DPT Physical Therapist- [facility]  10/21/2020, 1:02 PM

## 2020-10-21 NOTE — Progress Notes (Signed)
Noted decrease in pt bp, during tx nearing the pt uf goal. The pt is alert and speaking. He states he is feeling well, the patient is being monitored closely.

## 2020-10-21 NOTE — Progress Notes (Signed)
Pt placed on ekg monitoring at 9:28, spoke with Shae in [place]. Pt tx started at 6:21, with no complications.

## 2020-10-21 NOTE — Progress Notes (Signed)
Patient completed dialysis treatment as ordered for 3 hours. Prescribed Epogen given as ordered, see MAR. No adverse effects noted or reported. Patient tolerated well.

## 2020-10-21 NOTE — Care Management Important Message (Signed)
Important Message  Patient Details  Name: Alex Hampton MRN: [id number] Date of Birth: 1955-05-07   Medicare Important Message Given:  Yes     [name] 10/21/2020, 2:23 PM

## 2020-10-23 LAB — CULTURE, BLOOD (ROUTINE X 2)
Culture: NO GROWTH
Culture: NO GROWTH
Special Requests: ADEQUATE
Special Requests: ADEQUATE

## 2020-11-08 ENCOUNTER — Other Ambulatory Visit: Admission: RE | Admit: 2020-11-08 | Payer: Medicare Other | Source: Ambulatory Visit

## 2020-11-09 ENCOUNTER — Emergency Department
Admission: EM | Admit: 2020-11-09 | Discharge: 2020-11-09 | Disposition: A | Discharge status: Home / Self Care | Payer: Medicare Other | Attending: Emergency Medicine | Admitting: Emergency Medicine

## 2020-11-09 ENCOUNTER — Emergency Department: Payer: Medicare Other

## 2020-11-09 ENCOUNTER — Other Ambulatory Visit: Payer: Self-pay

## 2020-11-09 DIAGNOSIS — W19XXXA Unspecified fall, initial encounter: Secondary | ICD-10-CM

## 2020-11-09 DIAGNOSIS — Z87891 Personal history of nicotine dependence: Secondary | ICD-10-CM | POA: Diagnosis not present

## 2020-11-09 DIAGNOSIS — Z992 Dependence on renal dialysis: Secondary | ICD-10-CM | POA: Diagnosis not present

## 2020-11-09 DIAGNOSIS — S32020A Wedge compression fracture of second lumbar vertebra, initial encounter for closed fracture: Secondary | ICD-10-CM | POA: Diagnosis not present

## 2020-11-09 DIAGNOSIS — W01198A Fall on same level from slipping, tripping and stumbling with subsequent striking against other object, initial encounter: Secondary | ICD-10-CM | POA: Diagnosis not present

## 2020-11-09 DIAGNOSIS — Z794 Long term (current) use of insulin: Secondary | ICD-10-CM | POA: Diagnosis not present

## 2020-11-09 DIAGNOSIS — E1122 Type 2 diabetes mellitus with diabetic chronic kidney disease: Secondary | ICD-10-CM | POA: Diagnosis not present

## 2020-11-09 DIAGNOSIS — Z7902 Long term (current) use of antithrombotics/antiplatelets: Secondary | ICD-10-CM | POA: Insufficient documentation

## 2020-11-09 DIAGNOSIS — E1140 Type 2 diabetes mellitus with diabetic neuropathy, unspecified: Secondary | ICD-10-CM | POA: Insufficient documentation

## 2020-11-09 DIAGNOSIS — N186 End stage renal disease: Secondary | ICD-10-CM | POA: Diagnosis not present

## 2020-11-09 DIAGNOSIS — S3992XA Unspecified injury of lower back, initial encounter: Secondary | ICD-10-CM | POA: Diagnosis present

## 2020-11-09 DIAGNOSIS — I120 Hypertensive chronic kidney disease with stage 5 chronic kidney disease or end stage renal disease: Secondary | ICD-10-CM | POA: Insufficient documentation

## 2020-11-09 DIAGNOSIS — I2510 Atherosclerotic heart disease of native coronary artery without angina pectoris: Secondary | ICD-10-CM | POA: Diagnosis not present

## 2020-11-09 DIAGNOSIS — Z7982 Long term (current) use of aspirin: Secondary | ICD-10-CM | POA: Diagnosis not present

## 2020-11-09 DIAGNOSIS — Z79899 Other long term (current) drug therapy: Secondary | ICD-10-CM | POA: Diagnosis not present

## 2020-11-09 LAB — BASIC METABOLIC PANEL
Anion gap: 12 (ref 5–15)
BUN: 84 mg/dL — ABNORMAL HIGH (ref 8–23)
CO2: 26 mmol/L (ref 22–32)
Calcium: 9.4 mg/dL (ref 8.9–10.3)
Chloride: 94 mmol/L — ABNORMAL LOW (ref 98–111)
Creatinine, Ser: 2.77 mg/dL — ABNORMAL HIGH (ref 0.61–1.24)
GFR, Estimated: 24 mL/min — ABNORMAL LOW (ref >60)
Glucose, Bld: 152 mg/dL — ABNORMAL HIGH (ref 70–99)
Potassium: 3.9 mmol/L (ref 3.5–5.1)
Sodium: 132 mmol/L — ABNORMAL LOW (ref 135–145)

## 2020-11-09 LAB — URINALYSIS, COMPLETE (UACMP) WITH MICROSCOPIC
Bilirubin Urine: NEGATIVE
Glucose, UA: NEGATIVE mg/dL
Hgb urine dipstick: NEGATIVE
Ketones, ur: NEGATIVE mg/dL
Leukocytes,Ua: NEGATIVE
Nitrite: NEGATIVE
Protein, ur: NEGATIVE mg/dL
Specific Gravity, Urine: 1.012 (ref 1.005–1.030)
Squamous Epithelial / HPF: NONE SEEN (ref 0–5)
pH: 5 (ref 5.0–8.0)

## 2020-11-09 LAB — CBC
HCT: 24.4 % — ABNORMAL LOW (ref 39.0–52.0)
Hemoglobin: 8.4 g/dL — ABNORMAL LOW (ref 13.0–17.0)
MCH: 34 pg (ref 26.0–34.0)
MCHC: 34.4 g/dL (ref 30.0–36.0)
MCV: 98.8 fL (ref 80.0–100.0)
Platelets: 173 10*3/uL (ref 150–400)
RBC: 2.47 MIL/uL — ABNORMAL LOW (ref 4.22–5.81)
RDW: 15.9 % — ABNORMAL HIGH (ref 11.5–15.5)
WBC: 4.1 10*3/uL (ref 4.0–10.5)
nRBC: 0 % (ref 0.0–0.2)

## 2020-11-09 LAB — HEPATIC FUNCTION PANEL
ALT: 13 U/L (ref 0–44)
AST: 19 U/L (ref 15–41)
Albumin: 3.3 g/dL — ABNORMAL LOW (ref 3.5–5.0)
Alkaline Phosphatase: 93 U/L (ref 38–126)
Bilirubin, Direct: 0.1 mg/dL (ref 0.0–0.2)
Indirect Bilirubin: 0.4 mg/dL (ref 0.3–0.9)
Total Bilirubin: 0.5 mg/dL (ref 0.3–1.2)
Total Protein: 5.6 g/dL — ABNORMAL LOW (ref 6.5–8.1)

## 2020-11-09 LAB — LIPASE, BLOOD: Lipase: 23 U/L (ref 11–51)

## 2020-11-09 MED ORDER — LIDOCAINE 5 % EX PTCH
1.0000 | MEDICATED_PATCH | CUTANEOUS | Status: DC
  Administered 2020-11-09: 1 via TRANSDERMAL
  Filled 2020-11-09: qty 1

## 2020-11-09 MED ORDER — LIDOCAINE 5 % EX PTCH: 1.0000 | MEDICATED_PATCH | Freq: Two times a day (BID) | CUTANEOUS | 0 refills | Status: AC

## 2020-11-09 NOTE — ED Triage Notes (Addendum)
Pt here with a fall this morning. Pt states that he was feeling shaky and fell down. Son states that pt has been anemic lately and is a transplant pt and the falls have gotten more frequent in the past week. Pt denies hitting his head or any injury or pain. Pt also has a PEG tube.

## 2020-11-09 NOTE — ED Provider Notes (Signed)
[facility] Emergency Department Provider Note   ____________________________________________   Event Date/Time   First MD Initiated Contact with Patient 11/09/20 1204     (approximate)  I have reviewed the triage vital signs and the nursing notes.   HISTORY  Chief Complaint Fall    HPI Alex Hampton is a 65 y.o. male with past medical history of hypertension, hyperlipidemia, CAD, GERD, [name] cirrhosis status post liver transplant in 2021, and ESRD on HD (TTS) who presents to the ED for fall.  Patient reports that he has been feeling quite weak for the past couple of weeks.  He had a fall today where he was trying to walk to his car but became unsteady on his feet.  He fell backwards and struck his bottom as well as his lower back, denies hitting his head or losing consciousness.  He has been dealing with a cough for the past couple of weeks but denies any fevers, chest pain, or shortness of breath.  He has been making urine as usual and denies any dysuria, hematuria, abdominal pain, vomiting, or diarrhea. His p.o. intake has been decreased per son.  He typically has 3 small meals a day supplemented by tube feeds overnight.  They have not had any issues with his gastrostomy tube.  He was last dialyzed on Saturday, was scheduled for dialysis today but came to the ED instead.        Past Medical History:  Diagnosis Date   Depression    History of cardiac cath    History of heart artery stent    Hyperlipidemia    Hypertension    MI (myocardial infarction) ([place])    Renal disorder     Patient Active Problem List   Diagnosis Date Noted   Syncope 10/15/2020   Uremia 10/10/2020   Anemia in ESRD (end-stage renal disease) ([place]) 53/66/4403   Acute metabolic encephalopathy 47/42/5956   Hyperkalemia 10/10/2020   Elevated troponin 10/10/2020   GERD (gastroesophageal reflux disease) 10/10/2020   CAD (coronary artery disease) 10/10/2020   G tube feedings ([place])  10/10/2020   Diarrhea 10/10/2020   Type II diabetes mellitus with renal manifestations ([place]) 10/10/2020   Weakness    ACS (acute coronary syndrome) ([place])    Liver transplant recipient [facility])    Anemia of chronic disease    Type 2 diabetes mellitus with diabetic neuropathy, with long-term current use of insulin ([place])    Chest pain 08/19/2020   ESRD (end stage renal disease) ([place]) 08/19/2020   History of anemia due to chronic kidney disease 08/19/2020   Gait abnormality 10/17/2019   History of fall within past 90 days 10/17/2019   Hallucinations 10/17/2019   Cirrhosis of liver with ascites ([place]) on CT 10/17/2019   HTN (hypertension) 10/17/2019   History of MI (myocardial infarction) 10/17/2019   OSA (obstructive sleep apnea) 10/17/2019   Nondisplaced comminuted supracondylar fracture without intercondylar fracture of left humerus, subsequent encounter for fracture with nonunion 10/17/2019   Anxiety and depression 10/17/2019   Confusion and disorientation 10/17/2019    Past Surgical History:  Procedure Laterality Date   IR [facility] GASTRO/COLONIC TUBE PERCUT W/FLUORO  10/14/2020   LEFT HEART CATH AND CORONARY ANGIOGRAPHY N/A 08/19/2020   Procedure: LEFT HEART CATH AND CORONARY ANGIOGRAPHY;  Surgeon: Corey Skains, MD;  Location: [place] CV LAB;  Service: Cardiovascular;  Laterality: N/A;   LIVER TRANSPLANT      Prior to Admission medications   Medication Sig Start  Date End Date Taking? Authorizing Provider  lidocaine (LIDODERM) 5 % Place 1 patch onto the skin every 12 (twelve) hours. Remove & Discard patch within 12 hours or as directed by MD 11/09/20 11/09/21 Yes Blake Divine, MD  acetaminophen (TYLENOL) 325 MG tablet Take 650 mg by mouth every 8 (eight) hours as needed for mild pain or moderate pain.    [provider]  aspirin 81 MG EC tablet Take 81 mg by mouth daily.    [provider]  atorvastatin (LIPITOR) 40 MG tablet Take 40 mg by mouth at bedtime.     [provider]  clopidogrel (PLAVIX) 75 MG tablet Take 1 tablet (75 mg total) by mouth daily. 08/20/20   Loletha Grayer, MD  escitalopram (LEXAPRO) 20 MG tablet Take 20 mg by mouth daily. 08/06/20   [provider]  gabapentin (NEURONTIN) 100 MG capsule Take 100 mg by mouth 2 (two) times daily.    [provider]  hydrOXYzine (VISTARIL) 25 MG capsule Take 25 mg by mouth every 6 (six) hours as needed for anxiety or itching.    [provider]  melatonin 3 MG TABS tablet Take 9 mg by mouth at bedtime.    [provider]  metoCLOPramide (REGLAN) 5 MG tablet Take 5 mg by mouth 3 (three) times daily before meals.    [provider]  metoprolol tartrate (LOPRESSOR) 25 MG tablet Take 1 tablet (25 mg total) by mouth 2 (two) times daily. 08/20/20   Loletha Grayer, MD  mirtazapine (REMERON) 15 MG tablet Take 15 mg by mouth at bedtime. 08/06/20   [provider]  mycophenolate (CELLCEPT) 250 MG capsule Take 1,000 mg by mouth 2 (two) times daily.    [provider]  nitroGLYCERIN (NITROSTAT) 0.4 MG SL tablet Place 1 tablet (0.4 mg total) under the tongue every 5 (five) minutes as needed for chest pain. 08/20/20   Loletha Grayer, MD  NOVOLIN R 100 UNIT/ML injection Inject 0-11 Units into the skin See admin instructions. Take 6 units at 6pm plus correction with meal time. Correction scale with meals.  Glucose Range  <200 none, 201 - 250 mg/dL 1 unit, 251 - 300 mg/dL 2 units, 301 - 350 mg/dL 3 units, 351- 400 mg/dl 4 units , >400 mg/dL take 5 units and call your provider    [provider]  Nutritional Supplements (NOVASOURCE RENAL) LIQD Give by tube. Novasource Renal @ 75 ml/hr 8 hours /night (10 pm -8 am)    [provider]  pantoprazole (PROTONIX) 40 MG tablet Take 1 tablet (40 mg total) by mouth daily. 10/14/20   Enzo Bi, MD  predniSONE (DELTASONE) 5 MG tablet Take 17.5 mg by mouth daily.    [provider]   senna-docusate (SENOKOT-S) 8.6-50 MG tablet Take 2 tablets by mouth 2 (two) times daily as needed for mild constipation or moderate constipation. 10/14/20   Enzo Bi, MD  sulfamethoxazole-trimethoprim (BACTRIM DS) 800-160 MG tablet Take 1 tablet by mouth every Monday, Wednesday, and Friday. 08/06/20   [provider]  tacrolimus (PROGRAF) 1 MG capsule Take 7 mg by mouth 2 (two) times daily.    [provider]  traZODone (DESYREL) 150 MG tablet Take 150 mg by mouth at bedtime.    [provider]  valGANciclovir (VALCYTE) 450 MG tablet Take 450 mg by mouth 2 (two) times a week. (Monday and Thursday) with largest meal of the day.    [provider]    Allergies Patient  has no known allergies.  Family History  Problem Relation Age of Onset   Diabetes Mellitus II Mother    Pancreatic cancer Father     Social History Social History   Tobacco Use   Smoking status: Former   Smokeless tobacco: Never  Substance Use Topics   Alcohol use: Yes    Comment: occasionally   Drug use: Never    Review of Systems  Constitutional: No fever/chills.  Positive for generalized weakness. Eyes: No visual changes. ENT: No sore throat. Cardiovascular: Denies chest pain. Respiratory: Denies shortness of breath.  Positive for cough. Gastrointestinal: No abdominal pain.  No nausea, no vomiting.  No diarrhea.  No constipation. Genitourinary: Negative for dysuria. Musculoskeletal: Positive for back pain. Skin: Negative for rash. Neurological: Negative for headaches, focal weakness or numbness.  ____________________________________________   PHYSICAL EXAM:  VITAL SIGNS: ED Triage Vitals  Enc Vitals Group     BP 11/09/20 1117 106/66     Pulse Rate 11/09/20 1115 (!) 53     Resp 11/09/20 1115 16     Temp 11/09/20 1115 98.3 F (36.8 C)     Temp src --      SpO2 11/09/20 1115 99 %     Weight 11/09/20 1116 160 lb (72.6 kg)     Height 11/09/20 1116 6\' 1"  (1.854 m)      Head Circumference --      Peak Flow --      Pain Score 11/09/20 1116 0     Pain Loc --      Pain Edu? --      Excl. in [place]? --     Constitutional: Alert and oriented. Eyes: Conjunctivae are normal. Head: Atraumatic. Nose: No congestion/rhinnorhea. Mouth/Throat: Mucous membranes are moist. Neck: Normal ROM, no midline cervical spine tenderness to palpation. Cardiovascular: Normal rate, regular rhythm. Grossly normal heart sounds.  Right IJ TDC intact. Respiratory: Normal respiratory effort.  No retractions. Lungs CTAB. Gastrointestinal: Soft and nontender. No distention.  Gastrostomy tube intact. Genitourinary: deferred Musculoskeletal: No lower extremity tenderness nor edema.  No upper extremity bony tenderness to palpation.  Midline lumbar spinal tenderness to palpation noted. Neurologic:  Normal speech and language. No gross focal neurologic deficits are appreciated. Skin:  Skin is warm, dry and intact. No rash noted. Psychiatric: Mood and affect are normal. Speech and behavior are normal.  ____________________________________________   LABS (all labs ordered are listed, but only abnormal results are displayed)  Labs Reviewed  CBC - Abnormal; Notable for the following components:      Result Value   RBC 2.47 (*)    Hemoglobin 8.4 (*)    HCT 24.4 (*)    RDW 15.9 (*)    All other components within normal limits  BASIC METABOLIC PANEL - Abnormal; Notable for the following components:   Sodium 132 (*)    Chloride 94 (*)    Glucose, Bld 152 (*)    BUN 84 (*)    Creatinine, Ser 2.77 (*)    GFR, Estimated 24 (*)    All other components within normal limits  URINALYSIS, COMPLETE (UACMP) WITH MICROSCOPIC - Abnormal; Notable for the following components:   Color, Urine YELLOW (*)    APPearance HAZY (*)    Bacteria, UA RARE (*)    All other components within normal limits  HEPATIC FUNCTION PANEL - Abnormal; Notable for the following components:   Total Protein 5.6 (*)     Albumin 3.3 (*)    All other  components within normal limits  LIPASE, BLOOD   ____________________________________________  EKG  ED ECG REPORT I, Blake Divine, the attending physician, personally viewed and interpreted this ECG.   Date: 11/09/2020  EKG Time: 11:17  Rate: 64  Rhythm: normal sinus rhythm  Axis: LAD  Intervals:none  ST&T Change: Inferior Q waves, similar to previous   PROCEDURES  Procedure(s) performed (including Critical Care):  Procedures   ____________________________________________   INITIAL IMPRESSION / ASSESSMENT AND PLAN / ED COURSE      65 year old male with past medical history of hypertension, hyperlipidemia, CAD, [name] cirrhosis status post liver transplant in November 2021, ESRD on HD (TTS), who presents to the ED complaining of generalized weakness with a fall earlier today.  There are no signs of trauma to his head or neck area, patient denies hitting his head but does have midline lumbar spinal tenderness which we will further assess with x-ray.  There are is no evidence of traumatic injury to his trunk or extremities.  He does report generalized weakness with a cough, will check chest x-ray as well.  EKG shows no evidence of arrhythmia or ischemia, labs are reassuring, consistent with his ESRD but no significant electrolyte abnormality.  No indication for emergent dialysis at this time.  Hemoglobin is stable from previous and there is no evidence of bleeding.  We will check UA as patient makes urine, add on LFTs and lipase with his poor p.o. intake.  UA shows no signs of infection, chest x-ray reviewed by me and shows no infiltrate, edema, or effusion.  X-ray of lumbar spine does show mild to moderate compression deformity, slightly progressed from previous imaging of her of last year.  Patient reports pain improved following application of Lidoderm patch and he is appropriate for discharge home with outpatient orthopedic follow-up.  LFTs and  lipase are within normal limits.  Patient and son counseled to return to the ED for new worsening symptoms, patient agrees with plan.      ____________________________________________   FINAL CLINICAL IMPRESSION(S) / ED DIAGNOSES  Final diagnoses:  Fall, initial encounter  Compression fracture of L2 vertebra, initial encounter ([place])  ESRD on dialysis [facility])     ED Discharge Orders          Ordered    lidocaine (LIDODERM) 5 %  Every 12 hours        11/09/20 1422             Note:  This document was prepared using Dragon voice recognition software and may include unintentional dictation errors.    Blake Divine, MD 11/09/20 [phone]

## 2020-11-09 NOTE — ED Notes (Signed)
E-signature pad unavailable - Pt & Support person verbalized understanding of D/C information - no additional concerns at this time.

## 2020-11-09 NOTE — ED Notes (Signed)
Pt to XR

## 2020-11-09 NOTE — ED Notes (Signed)
Pt provided a urinal.  

## 2020-11-10 ENCOUNTER — Ambulatory Visit: Payer: Medicare Other | Attending: Neurology

## 2020-11-10 DIAGNOSIS — G4761 Periodic limb movement disorder: Secondary | ICD-10-CM | POA: Insufficient documentation

## 2020-11-10 DIAGNOSIS — G4733 Obstructive sleep apnea (adult) (pediatric): Secondary | ICD-10-CM | POA: Insufficient documentation

## 2020-11-10 DIAGNOSIS — Z9989 Dependence on other enabling machines and devices: Secondary | ICD-10-CM | POA: Insufficient documentation

## 2020-11-11 ENCOUNTER — Other Ambulatory Visit: Payer: Self-pay

## 2020-11-21 ENCOUNTER — Other Ambulatory Visit: Payer: Self-pay

## 2020-11-21 ENCOUNTER — Encounter: Payer: Self-pay | Admitting: Emergency Medicine

## 2020-11-21 ENCOUNTER — Emergency Department
Admission: EM | Admit: 2020-11-21 | Discharge: 2020-11-21 | Disposition: A | Discharge status: Home / Self Care | Payer: Medicare Other | Attending: Emergency Medicine | Admitting: Emergency Medicine

## 2020-11-21 DIAGNOSIS — K9423 Gastrostomy malfunction: Secondary | ICD-10-CM | POA: Diagnosis present

## 2020-11-21 DIAGNOSIS — E1122 Type 2 diabetes mellitus with diabetic chronic kidney disease: Secondary | ICD-10-CM | POA: Insufficient documentation

## 2020-11-21 DIAGNOSIS — I2510 Atherosclerotic heart disease of native coronary artery without angina pectoris: Secondary | ICD-10-CM | POA: Insufficient documentation

## 2020-11-21 DIAGNOSIS — Z7902 Long term (current) use of antithrombotics/antiplatelets: Secondary | ICD-10-CM | POA: Diagnosis not present

## 2020-11-21 DIAGNOSIS — Z79899 Other long term (current) drug therapy: Secondary | ICD-10-CM | POA: Diagnosis not present

## 2020-11-21 DIAGNOSIS — Z7982 Long term (current) use of aspirin: Secondary | ICD-10-CM | POA: Insufficient documentation

## 2020-11-21 DIAGNOSIS — D631 Anemia in chronic kidney disease: Secondary | ICD-10-CM | POA: Insufficient documentation

## 2020-11-21 DIAGNOSIS — Z87891 Personal history of nicotine dependence: Secondary | ICD-10-CM | POA: Diagnosis not present

## 2020-11-21 DIAGNOSIS — E1140 Type 2 diabetes mellitus with diabetic neuropathy, unspecified: Secondary | ICD-10-CM | POA: Diagnosis not present

## 2020-11-21 DIAGNOSIS — Z7984 Long term (current) use of oral hypoglycemic drugs: Secondary | ICD-10-CM | POA: Insufficient documentation

## 2020-11-21 DIAGNOSIS — N186 End stage renal disease: Secondary | ICD-10-CM | POA: Insufficient documentation

## 2020-11-21 DIAGNOSIS — I120 Hypertensive chronic kidney disease with stage 5 chronic kidney disease or end stage renal disease: Secondary | ICD-10-CM | POA: Diagnosis not present

## 2020-11-21 DIAGNOSIS — K9413 Enterostomy malfunction: Secondary | ICD-10-CM

## 2020-11-21 DIAGNOSIS — Z992 Dependence on renal dialysis: Secondary | ICD-10-CM | POA: Insufficient documentation

## 2020-11-21 DIAGNOSIS — Z955 Presence of coronary angioplasty implant and graft: Secondary | ICD-10-CM | POA: Insufficient documentation

## 2020-11-21 NOTE — ED Provider Notes (Signed)
[facility]  ____________________________________________   Event Date/Time   First MD Initiated Contact with Patient 11/21/20 1434     (approximate)  I have reviewed the triage vital signs and the nursing notes.   HISTORY  Chief Complaint Clogged feeding tube    HPI Alex Hampton is a 65 y.o. male past medical history of hypertension, hyperlipidemia, GERD, depression with anxiety, CAD, stent placement, anemia, liver cirrhosis with ascites (s/p of liver transplantation in [facility], on immunosuppressant), ESRD, OSA not on CPAP, s/p of G-J -tube who presents with clogged J-tube.  Has been clogged for 2 days.  Patient tells me that he takes about half of his food by mouth and then has continuous feeds overnight because he is not able to take enough nutrition by mouth.  He notes that he chronically has felt fatigued and tired but this is not new for him.  Denies nausea or vomiting.  He has had daily mild stomachache but no new abdominal pain.  No fevers or chills.  GJ was placed last year.         Past Medical History:  Diagnosis Date   Depression    History of cardiac cath    History of heart artery stent    Hyperlipidemia    Hypertension    MI (myocardial infarction) ([place])    Renal disorder     Patient Active Problem List   Diagnosis Date Noted   Syncope 10/15/2020   Uremia 10/10/2020   Anemia in ESRD (end-stage renal disease) ([place]) 42/70/6237   Acute metabolic encephalopathy 62/83/1517   Hyperkalemia 10/10/2020   Elevated troponin 10/10/2020   GERD (gastroesophageal reflux disease) 10/10/2020   CAD (coronary artery disease) 10/10/2020   G tube feedings ([place]) 10/10/2020   Diarrhea 10/10/2020   Type II diabetes mellitus with renal manifestations ([place]) 10/10/2020   Weakness    ACS (acute coronary syndrome) ([place])    Liver transplant recipient [facility])    Anemia of chronic disease    Type 2 diabetes mellitus with diabetic neuropathy, with long-term  current use of insulin ([place])    Chest pain 08/19/2020   ESRD (end stage renal disease) ([place]) 08/19/2020   History of anemia due to chronic kidney disease 08/19/2020   Gait abnormality 10/17/2019   History of fall within past 90 days 10/17/2019   Hallucinations 10/17/2019   Cirrhosis of liver with ascites ([place]) on CT 10/17/2019   HTN (hypertension) 10/17/2019   History of MI (myocardial infarction) 10/17/2019   OSA (obstructive sleep apnea) 10/17/2019   Nondisplaced comminuted supracondylar fracture without intercondylar fracture of left humerus, subsequent encounter for fracture with nonunion 10/17/2019   Anxiety and depression 10/17/2019   Confusion and disorientation 10/17/2019    Past Surgical History:  Procedure Laterality Date   IR [facility] GASTRO/COLONIC TUBE PERCUT W/FLUORO  10/14/2020   LEFT HEART CATH AND CORONARY ANGIOGRAPHY N/A 08/19/2020   Procedure: LEFT HEART CATH AND CORONARY ANGIOGRAPHY;  Surgeon: Corey Skains, MD;  Location: [place] CV LAB;  Service: Cardiovascular;  Laterality: N/A;   LIVER TRANSPLANT      Prior to Admission medications   Medication Sig Start Date End Date Taking? Authorizing Provider  acetaminophen (TYLENOL) 325 MG tablet Take 650 mg by mouth every 8 (eight) hours as needed for mild pain or moderate pain.    [provider]  aspirin 81 MG EC tablet Take 81 mg by mouth daily.    [provider]  atorvastatin (LIPITOR) 40 MG  tablet Take 40 mg by mouth at bedtime.    [provider]  clopidogrel (PLAVIX) 75 MG tablet Take 1 tablet (75 mg total) by mouth daily. 08/20/20   Loletha Grayer, MD  escitalopram (LEXAPRO) 20 MG tablet Take 20 mg by mouth daily. 08/06/20   [provider]  gabapentin (NEURONTIN) 100 MG capsule Take 100 mg by mouth 2 (two) times daily.    [provider]  hydrOXYzine (VISTARIL) 25 MG capsule Take 25 mg by mouth every 6 (six) hours as needed for anxiety or itching.    [provider]  lidocaine (LIDODERM) 5 % Place 1 patch onto the skin every 12 (twelve) hours. Remove & Discard patch within 12 hours or as directed by MD 11/09/20 11/09/21  Blake Divine, MD  melatonin 3 MG TABS tablet Take 9 mg by mouth at bedtime.    [provider]  metoCLOPramide (REGLAN) 5 MG tablet Take 5 mg by mouth 3 (three) times daily before meals.    [provider]  metoprolol tartrate (LOPRESSOR) 25 MG tablet Take 1 tablet (25 mg total) by mouth 2 (two) times daily. 08/20/20   Loletha Grayer, MD  mirtazapine (REMERON) 15 MG tablet Take 15 mg by mouth at bedtime. 08/06/20   [provider]  mycophenolate (CELLCEPT) 250 MG capsule Take 1,000 mg by mouth 2 (two) times daily.    [provider]  nitroGLYCERIN (NITROSTAT) 0.4 MG SL tablet Place 1 tablet (0.4 mg total) under the tongue every 5 (five) minutes as needed for chest pain. 08/20/20   Loletha Grayer, MD  NOVOLIN R 100 UNIT/ML injection Inject 0-11 Units into the skin See admin instructions. Take 6 units at 6pm plus correction with meal time. Correction scale with meals.  Glucose Range  <200 none, 201 - 250 mg/dL 1 unit, 251 - 300 mg/dL 2 units, 301 - 350 mg/dL 3 units, 351- 400 mg/dl 4 units , >400 mg/dL take 5 units and call your provider    [provider]  Nutritional Supplements (NOVASOURCE RENAL) LIQD Give by tube. Novasource Renal @ 75 ml/hr 8 hours /night (10 pm -8 am)    [provider]  pantoprazole (PROTONIX) 40 MG tablet Take 1 tablet (40 mg total) by mouth daily. 10/14/20   Enzo Bi, MD  predniSONE (DELTASONE) 5 MG tablet Take 17.5 mg by mouth daily.    [provider]  senna-docusate (SENOKOT-S) 8.6-50 MG tablet Take 2 tablets by mouth 2 (two) times daily as needed for mild constipation or moderate constipation. 10/14/20   Enzo Bi, MD  sulfamethoxazole-trimethoprim (BACTRIM DS) 800-160 MG tablet Take 1 tablet by mouth every Monday, Wednesday, and Friday. 08/06/20    [provider]  tacrolimus (PROGRAF) 1 MG capsule Take 7 mg by mouth 2 (two) times daily.    [provider]  traZODone (DESYREL) 150 MG tablet Take 150 mg by mouth at bedtime.    [provider]  valGANciclovir (VALCYTE) 450 MG tablet Take 450 mg by mouth 2 (two) times a week. (Monday and Thursday) with largest meal of the day.    [provider]    Allergies Patient has no known allergies.  Family History  Problem Relation Age of Onset   Diabetes Mellitus II Mother    Pancreatic cancer Father     Social History Social History   Tobacco Use   Smoking status: Former   Smokeless tobacco: Never  Substance Use Topics   Alcohol use: Yes  Comment: occasionally   Drug use: Never    Review of Systems   Review of Systems  Constitutional:  Positive for fatigue. Negative for activity change, appetite change and fever.  Respiratory:  Negative for shortness of breath.   Cardiovascular:  Negative for chest pain.  Gastrointestinal:  Negative for nausea and vomiting.   Physical Exam Updated Vital Signs BP 126/78   Pulse 64   Temp 98.6 F (37 C) (Oral)   Resp 18   Wt 72.6 kg   SpO2 100%   BMI 21.11 kg/m   Physical Exam Vitals and nursing note reviewed.  Constitutional:      General: He is not in acute distress.    Appearance: Normal appearance.     Comments: Appears chronically ill but nontoxic  HENT:     Head: Normocephalic and atraumatic.  Eyes:     General: No scleral icterus.    Conjunctiva/sclera: Conjunctivae normal.  Pulmonary:     Effort: Pulmonary effort is normal. No respiratory distress.     Breath sounds: Normal breath sounds. No wheezing.  Abdominal:     General: There is no distension.     Tenderness: There is no abdominal tenderness.     Comments: G-tube in place without surrounding erythema  Musculoskeletal:        General: No deformity or signs of injury.     Cervical back: Normal range of motion.  Skin:     Coloration: Skin is not jaundiced or pale.  Neurological:     General: No focal deficit present.     Mental Status: He is alert and oriented to person, place, and time. Mental status is at baseline.  Psychiatric:        Mood and Affect: Mood normal.        Behavior: Behavior normal.     LABS (all labs ordered are listed, but only abnormal results are displayed)  Labs Reviewed - No data to display ____________________________________________  EKG  N/a ____________________________________________  RADIOLOGY Almeta Monas, personally viewed and evaluated these images (plain radiographs) as part of my medical decision making, as well as reviewing the written report by the radiologist.  ED MD interpretation:  n/a    ____________________________________________   PROCEDURES  Procedure(s) performed (including Critical Care):  Procedures   ____________________________________________   INITIAL IMPRESSION / ASSESSMENT AND PLAN / ED COURSE     65 year old male with extensive past medical history as above who presents with a clogged GJ tube for 2 days.  He has stable fatigue but no other acute symptoms today and tells me he would not have come to the ED if his tube was working.  He still takes some p.o. and feeds overnight.  Discussed with IR on-call Dr. Suzan Slick who notes that the patient can call the radiology department tomorrow and can have the tube exchanged on the same day.  I discussed with the patient and he feels comfortable with the plan.  Given he has normal vital signs and is otherwise at his baseline do not feel like any additional work-up is necessary today.      ____________________________________________   FINAL CLINICAL IMPRESSION(S) / ED DIAGNOSES  Final diagnoses:  Malfunction of jejunostomy tube [facility])     ED Discharge Orders     None        Note:  This document was prepared using Dragon voice recognition software and may include  unintentional dictation errors.    Rada Hay, MD 11/21/20 1556

## 2020-11-21 NOTE — Discharge Instructions (Addendum)
I spoke with the interventional radiologist who recommends that you call the radiology department tomorrow and you can likely be brought in tomorrow to have the tube exchanged.

## 2020-11-21 NOTE — ED Notes (Signed)
See triage note- pt here with clogged feeding tube. States he has not been able to use it since Thursday. Is able to eat meals/ take medications orally.

## 2020-11-21 NOTE — ED Triage Notes (Signed)
Pt in with c/o clogged feeding tube, happened yesterday. Uses PEG for supplemental feeds, had recent Liver transplant in Nov 21'. Also c/o mild nausea, no abdominal pain.

## 2020-11-22 ENCOUNTER — Ambulatory Visit
Admission: RE | Admit: 2020-11-22 | Discharge: 2020-11-22 | Disposition: A | Discharge status: Home / Self Care | Payer: Medicare Other | Source: Ambulatory Visit | Attending: Interventional Radiology | Admitting: Interventional Radiology

## 2020-11-22 ENCOUNTER — Other Ambulatory Visit: Payer: Self-pay | Admitting: Radiology

## 2020-11-22 DIAGNOSIS — K9423 Gastrostomy malfunction: Secondary | ICD-10-CM | POA: Insufficient documentation

## 2020-11-22 HISTORY — PX: IR GJ TUBE CHANGE: IMG1440

## 2020-11-22 MED ORDER — LIDOCAINE VISCOUS HCL 2 % MT SOLN
OROMUCOSAL | Status: AC
  Filled 2020-11-22: qty 15

## 2020-11-22 MED ORDER — IOHEXOL 350 MG/ML SOLN
20.0000 mL | Freq: Once | INTRAVENOUS | Status: AC | PRN
  Administered 2020-11-22: 20 mL
  Filled 2020-11-22: qty 20

## 2020-11-25 ENCOUNTER — Other Ambulatory Visit: Payer: Self-pay

## 2020-11-25 DIAGNOSIS — T450X5A Adverse effect of antiallergic and antiemetic drugs, initial encounter: Secondary | ICD-10-CM | POA: Diagnosis present

## 2020-11-25 DIAGNOSIS — K9423 Gastrostomy malfunction: Principal | ICD-10-CM | POA: Diagnosis present

## 2020-11-25 DIAGNOSIS — Z20822 Contact with and (suspected) exposure to covid-19: Secondary | ICD-10-CM | POA: Diagnosis present

## 2020-11-25 DIAGNOSIS — E785 Hyperlipidemia, unspecified: Secondary | ICD-10-CM | POA: Diagnosis present

## 2020-11-25 DIAGNOSIS — D631 Anemia in chronic kidney disease: Secondary | ICD-10-CM | POA: Diagnosis present

## 2020-11-25 DIAGNOSIS — Z7902 Long term (current) use of antithrombotics/antiplatelets: Secondary | ICD-10-CM

## 2020-11-25 DIAGNOSIS — Z7982 Long term (current) use of aspirin: Secondary | ICD-10-CM

## 2020-11-25 DIAGNOSIS — R440 Auditory hallucinations: Secondary | ICD-10-CM | POA: Diagnosis present

## 2020-11-25 DIAGNOSIS — I120 Hypertensive chronic kidney disease with stage 5 chronic kidney disease or end stage renal disease: Secondary | ICD-10-CM | POA: Diagnosis present

## 2020-11-25 DIAGNOSIS — K7581 Nonalcoholic steatohepatitis (NASH): Secondary | ICD-10-CM | POA: Diagnosis present

## 2020-11-25 DIAGNOSIS — K862 Cyst of pancreas: Secondary | ICD-10-CM | POA: Diagnosis present

## 2020-11-25 DIAGNOSIS — K7460 Unspecified cirrhosis of liver: Secondary | ICD-10-CM | POA: Diagnosis present

## 2020-11-25 DIAGNOSIS — Z833 Family history of diabetes mellitus: Secondary | ICD-10-CM

## 2020-11-25 DIAGNOSIS — R531 Weakness: Secondary | ICD-10-CM | POA: Diagnosis not present

## 2020-11-25 DIAGNOSIS — Z7952 Long term (current) use of systemic steroids: Secondary | ICD-10-CM

## 2020-11-25 DIAGNOSIS — E860 Dehydration: Secondary | ICD-10-CM | POA: Diagnosis present

## 2020-11-25 DIAGNOSIS — T426X5A Adverse effect of other antiepileptic and sedative-hypnotic drugs, initial encounter: Secondary | ICD-10-CM | POA: Diagnosis present

## 2020-11-25 DIAGNOSIS — Z87891 Personal history of nicotine dependence: Secondary | ICD-10-CM

## 2020-11-25 DIAGNOSIS — Z944 Liver transplant status: Secondary | ICD-10-CM

## 2020-11-25 DIAGNOSIS — Z79899 Other long term (current) drug therapy: Secondary | ICD-10-CM

## 2020-11-25 DIAGNOSIS — I953 Hypotension of hemodialysis: Secondary | ICD-10-CM | POA: Diagnosis not present

## 2020-11-25 DIAGNOSIS — F32A Depression, unspecified: Secondary | ICD-10-CM | POA: Diagnosis present

## 2020-11-25 DIAGNOSIS — G4733 Obstructive sleep apnea (adult) (pediatric): Secondary | ICD-10-CM | POA: Diagnosis present

## 2020-11-25 DIAGNOSIS — T40425A Adverse effect of tramadol, initial encounter: Secondary | ICD-10-CM | POA: Diagnosis present

## 2020-11-25 DIAGNOSIS — R441 Visual hallucinations: Secondary | ICD-10-CM | POA: Diagnosis present

## 2020-11-25 DIAGNOSIS — Z9181 History of falling: Secondary | ICD-10-CM

## 2020-11-25 DIAGNOSIS — Z794 Long term (current) use of insulin: Secondary | ICD-10-CM

## 2020-11-25 DIAGNOSIS — E1122 Type 2 diabetes mellitus with diabetic chronic kidney disease: Secondary | ICD-10-CM | POA: Diagnosis present

## 2020-11-25 DIAGNOSIS — Z992 Dependence on renal dialysis: Secondary | ICD-10-CM

## 2020-11-25 DIAGNOSIS — Z955 Presence of coronary angioplasty implant and graft: Secondary | ICD-10-CM

## 2020-11-25 DIAGNOSIS — I2510 Atherosclerotic heart disease of native coronary artery without angina pectoris: Secondary | ICD-10-CM | POA: Diagnosis present

## 2020-11-25 DIAGNOSIS — E1140 Type 2 diabetes mellitus with diabetic neuropathy, unspecified: Secondary | ICD-10-CM | POA: Diagnosis present

## 2020-11-25 DIAGNOSIS — Y848 Other medical procedures as the cause of abnormal reaction of the patient, or of later complication, without mention of misadventure at the time of the procedure: Secondary | ICD-10-CM | POA: Diagnosis present

## 2020-11-25 DIAGNOSIS — I252 Old myocardial infarction: Secondary | ICD-10-CM

## 2020-11-25 DIAGNOSIS — M4856XA Collapsed vertebra, not elsewhere classified, lumbar region, initial encounter for fracture: Secondary | ICD-10-CM | POA: Diagnosis present

## 2020-11-25 DIAGNOSIS — R6881 Early satiety: Secondary | ICD-10-CM | POA: Diagnosis present

## 2020-11-25 DIAGNOSIS — G8929 Other chronic pain: Secondary | ICD-10-CM | POA: Diagnosis present

## 2020-11-25 DIAGNOSIS — N186 End stage renal disease: Secondary | ICD-10-CM | POA: Diagnosis present

## 2020-11-25 LAB — URINALYSIS, COMPLETE (UACMP) WITH MICROSCOPIC
Bilirubin Urine: NEGATIVE
Glucose, UA: NEGATIVE mg/dL
Hgb urine dipstick: NEGATIVE
Ketones, ur: NEGATIVE mg/dL
Leukocytes,Ua: NEGATIVE
Nitrite: NEGATIVE
Protein, ur: NEGATIVE mg/dL
Specific Gravity, Urine: 1.016 (ref 1.005–1.030)
Squamous Epithelial / HPF: NONE SEEN (ref 0–5)
pH: 5 (ref 5.0–8.0)

## 2020-11-25 LAB — BASIC METABOLIC PANEL
Anion gap: 10 (ref 5–15)
BUN: 19 mg/dL (ref 8–23)
CO2: 27 mmol/L (ref 22–32)
Calcium: 8.6 mg/dL — ABNORMAL LOW (ref 8.9–10.3)
Chloride: 98 mmol/L (ref 98–111)
Creatinine, Ser: 1.49 mg/dL — ABNORMAL HIGH (ref 0.61–1.24)
GFR, Estimated: 51 mL/min — ABNORMAL LOW (ref >60)
Glucose, Bld: 195 mg/dL — ABNORMAL HIGH (ref 70–99)
Potassium: 3.8 mmol/L (ref 3.5–5.1)
Sodium: 135 mmol/L (ref 135–145)

## 2020-11-25 LAB — CBC
HCT: 26.4 % — ABNORMAL LOW (ref 39.0–52.0)
Hemoglobin: 9 g/dL — ABNORMAL LOW (ref 13.0–17.0)
MCH: 34.4 pg — ABNORMAL HIGH (ref 26.0–34.0)
MCHC: 34.1 g/dL (ref 30.0–36.0)
MCV: 100.8 fL — ABNORMAL HIGH (ref 80.0–100.0)
Platelets: 162 10*3/uL (ref 150–400)
RBC: 2.62 MIL/uL — ABNORMAL LOW (ref 4.22–5.81)
RDW: 14.9 % (ref 11.5–15.5)
WBC: 6.2 10*3/uL (ref 4.0–10.5)
nRBC: 0 % (ref 0.0–0.2)

## 2020-11-25 NOTE — ED Triage Notes (Signed)
Pt with hx of liver transplant, hx of renal failure. Pt had dialysis today. Per pt's son, no issues at dialysis. Pt with clogged feeding tube, that was replaced this past Monday, but since then pt has been weak, lethargic, not eating as well, and thinks he might be experiencing some confusion or auditory hallucinations per pt.

## 2020-11-26 ENCOUNTER — Emergency Department: Payer: Medicare Other

## 2020-11-26 ENCOUNTER — Inpatient Hospital Stay: Payer: Medicare Other | Admitting: Radiology

## 2020-11-26 ENCOUNTER — Inpatient Hospital Stay
Admission: EM | Admit: 2020-11-26 | Discharge: 2020-11-30 | DRG: 393 | Disposition: A | Discharge status: Home Health | Payer: Medicare Other | Attending: Internal Medicine | Admitting: Internal Medicine

## 2020-11-26 DIAGNOSIS — N186 End stage renal disease: Secondary | ICD-10-CM | POA: Diagnosis present

## 2020-11-26 DIAGNOSIS — T85598S Other mechanical complication of other gastrointestinal prosthetic devices, implants and grafts, sequela: Secondary | ICD-10-CM | POA: Diagnosis not present

## 2020-11-26 DIAGNOSIS — R531 Weakness: Secondary | ICD-10-CM | POA: Diagnosis not present

## 2020-11-26 DIAGNOSIS — T85598A Other mechanical complication of other gastrointestinal prosthetic devices, implants and grafts, initial encounter: Secondary | ICD-10-CM

## 2020-11-26 DIAGNOSIS — E1122 Type 2 diabetes mellitus with diabetic chronic kidney disease: Secondary | ICD-10-CM | POA: Diagnosis present

## 2020-11-26 DIAGNOSIS — E1140 Type 2 diabetes mellitus with diabetic neuropathy, unspecified: Secondary | ICD-10-CM

## 2020-11-26 DIAGNOSIS — R296 Repeated falls: Secondary | ICD-10-CM

## 2020-11-26 DIAGNOSIS — R6881 Early satiety: Secondary | ICD-10-CM | POA: Diagnosis not present

## 2020-11-26 DIAGNOSIS — I2510 Atherosclerotic heart disease of native coronary artery without angina pectoris: Secondary | ICD-10-CM | POA: Diagnosis present

## 2020-11-26 DIAGNOSIS — T85598D Other mechanical complication of other gastrointestinal prosthetic devices, implants and grafts, subsequent encounter: Secondary | ICD-10-CM

## 2020-11-26 DIAGNOSIS — R440 Auditory hallucinations: Secondary | ICD-10-CM | POA: Diagnosis present

## 2020-11-26 DIAGNOSIS — K862 Cyst of pancreas: Secondary | ICD-10-CM | POA: Diagnosis present

## 2020-11-26 DIAGNOSIS — Z944 Liver transplant status: Secondary | ICD-10-CM | POA: Diagnosis not present

## 2020-11-26 DIAGNOSIS — R441 Visual hallucinations: Secondary | ICD-10-CM | POA: Diagnosis present

## 2020-11-26 DIAGNOSIS — D638 Anemia in other chronic diseases classified elsewhere: Secondary | ICD-10-CM | POA: Diagnosis present

## 2020-11-26 DIAGNOSIS — I120 Hypertensive chronic kidney disease with stage 5 chronic kidney disease or end stage renal disease: Secondary | ICD-10-CM | POA: Diagnosis present

## 2020-11-26 DIAGNOSIS — Z20822 Contact with and (suspected) exposure to covid-19: Secondary | ICD-10-CM | POA: Diagnosis present

## 2020-11-26 DIAGNOSIS — Z992 Dependence on renal dialysis: Secondary | ICD-10-CM

## 2020-11-26 DIAGNOSIS — T450X5A Adverse effect of antiallergic and antiemetic drugs, initial encounter: Secondary | ICD-10-CM | POA: Diagnosis present

## 2020-11-26 DIAGNOSIS — G4733 Obstructive sleep apnea (adult) (pediatric): Secondary | ICD-10-CM | POA: Diagnosis present

## 2020-11-26 DIAGNOSIS — E860 Dehydration: Secondary | ICD-10-CM | POA: Diagnosis present

## 2020-11-26 DIAGNOSIS — M4856XA Collapsed vertebra, not elsewhere classified, lumbar region, initial encounter for fracture: Secondary | ICD-10-CM | POA: Diagnosis present

## 2020-11-26 DIAGNOSIS — T426X5A Adverse effect of other antiepileptic and sedative-hypnotic drugs, initial encounter: Secondary | ICD-10-CM | POA: Diagnosis present

## 2020-11-26 DIAGNOSIS — T40425A Adverse effect of tramadol, initial encounter: Secondary | ICD-10-CM | POA: Diagnosis present

## 2020-11-26 DIAGNOSIS — K9423 Gastrostomy malfunction: Secondary | ICD-10-CM | POA: Diagnosis present

## 2020-11-26 DIAGNOSIS — I953 Hypotension of hemodialysis: Secondary | ICD-10-CM | POA: Diagnosis not present

## 2020-11-26 DIAGNOSIS — G8929 Other chronic pain: Secondary | ICD-10-CM | POA: Diagnosis present

## 2020-11-26 DIAGNOSIS — F32A Depression, unspecified: Secondary | ICD-10-CM | POA: Diagnosis present

## 2020-11-26 DIAGNOSIS — D631 Anemia in chronic kidney disease: Secondary | ICD-10-CM | POA: Diagnosis present

## 2020-11-26 DIAGNOSIS — E785 Hyperlipidemia, unspecified: Secondary | ICD-10-CM | POA: Diagnosis present

## 2020-11-26 DIAGNOSIS — Z794 Long term (current) use of insulin: Secondary | ICD-10-CM

## 2020-11-26 DIAGNOSIS — I252 Old myocardial infarction: Secondary | ICD-10-CM | POA: Diagnosis not present

## 2020-11-26 DIAGNOSIS — Y848 Other medical procedures as the cause of abnormal reaction of the patient, or of later complication, without mention of misadventure at the time of the procedure: Secondary | ICD-10-CM | POA: Diagnosis present

## 2020-11-26 HISTORY — PX: IR GJ TUBE CHANGE: IMG1440

## 2020-11-26 LAB — HEPATIC FUNCTION PANEL
ALT: 15 U/L (ref 0–44)
AST: 20 U/L (ref 15–41)
Albumin: 3.6 g/dL (ref 3.5–5.0)
Alkaline Phosphatase: 126 U/L (ref 38–126)
Bilirubin, Direct: 0.1 mg/dL (ref 0.0–0.2)
Indirect Bilirubin: 0.4 mg/dL (ref 0.3–0.9)
Total Bilirubin: 0.5 mg/dL (ref 0.3–1.2)
Total Protein: 6.2 g/dL — ABNORMAL LOW (ref 6.5–8.1)

## 2020-11-26 LAB — LACTIC ACID, PLASMA
Lactic Acid, Venous: 1.9 mmol/L (ref 0.5–1.9)
Lactic Acid, Venous: 1.3 mmol/L (ref 0.5–1.9)

## 2020-11-26 LAB — RESP PANEL BY RT-PCR (FLU A&B, COVID) ARPGX2
Influenza A by PCR: NEGATIVE
Influenza B by PCR: NEGATIVE
SARS Coronavirus 2 by RT PCR: NEGATIVE

## 2020-11-26 LAB — LIPASE, BLOOD: Lipase: 20 U/L (ref 11–51)

## 2020-11-26 LAB — TROPONIN I (HIGH SENSITIVITY): Troponin I (High Sensitivity): 25 ng/L — ABNORMAL HIGH (ref <18)

## 2020-11-26 LAB — AMMONIA: Ammonia: 10 umol/L (ref 9–35)

## 2020-11-26 LAB — HIV ANTIBODY (ROUTINE TESTING W REFLEX): HIV Screen 4th Generation wRfx: NONREACTIVE

## 2020-11-26 LAB — PROCALCITONIN: Procalcitonin: 0.13 ng/mL

## 2020-11-26 MED ORDER — IOHEXOL 300 MG/ML  SOLN
25.0000 mL | Freq: Once | INTRAMUSCULAR | Status: AC | PRN
  Administered 2020-11-26: 25 mL

## 2020-11-26 MED ORDER — ONDANSETRON HCL 4 MG PO TABS: 4.0000 mg | ORAL_TABLET | Freq: Four times a day (QID) | ORAL | Status: DC | PRN

## 2020-11-26 MED ORDER — LIDOCAINE 5 % EX PTCH
1.0000 | MEDICATED_PATCH | Freq: Two times a day (BID) | CUTANEOUS | Status: DC
  Administered 2020-11-26 – 2020-11-30 (×9): 1 via TRANSDERMAL
  Filled 2020-11-26 (×11): qty 1

## 2020-11-26 MED ORDER — ESCITALOPRAM OXALATE 20 MG PO TABS
20.0000 mg | ORAL_TABLET | Freq: Every day | ORAL | Status: DC
  Administered 2020-11-27 – 2020-11-30 (×4): 20 mg via ORAL
  Filled 2020-11-26 (×4): qty 1

## 2020-11-26 MED ORDER — CHLORHEXIDINE GLUCONATE CLOTH 2 % EX PADS
6.0000 | MEDICATED_PAD | Freq: Every day | CUTANEOUS | Status: DC
  Administered 2020-11-27 – 2020-11-30 (×4): 6 via TOPICAL
  Filled 2020-11-26: qty 6

## 2020-11-26 MED ORDER — METHYLPREDNISOLONE SODIUM SUCC 40 MG IJ SOLR
20.0000 mg | Freq: Every day | INTRAMUSCULAR | Status: DC
  Administered 2020-11-26 – 2020-11-30 (×5): 20 mg via INTRAVENOUS
  Filled 2020-11-26 (×5): qty 1

## 2020-11-26 MED ORDER — TACROLIMUS 1 MG PO CAPS
8.0000 mg | ORAL_CAPSULE | Freq: Every day | ORAL | Status: DC
  Administered 2020-11-27 – 2020-11-29 (×4): 8 mg via ORAL
  Filled 2020-11-26 (×5): qty 8

## 2020-11-26 MED ORDER — TACROLIMUS 1 MG PO CAPS: 7.0000 mg | ORAL_CAPSULE | Freq: Two times a day (BID) | ORAL | Status: DC

## 2020-11-26 MED ORDER — CLOPIDOGREL BISULFATE 75 MG PO TABS
75.0000 mg | ORAL_TABLET | Freq: Every day | ORAL | Status: DC
  Administered 2020-11-26 – 2020-11-30 (×5): 75 mg via ORAL
  Filled 2020-11-26 (×5): qty 1

## 2020-11-26 MED ORDER — TACROLIMUS 1 MG PO CAPS
7.0000 mg | ORAL_CAPSULE | Freq: Every morning | ORAL | Status: DC
  Administered 2020-11-27 – 2020-11-30 (×4): 7 mg via ORAL
  Filled 2020-11-26 (×6): qty 7

## 2020-11-26 MED ORDER — SODIUM CHLORIDE 0.9 % IV BOLUS
500.0000 mL | Freq: Once | INTRAVENOUS | Status: AC
  Administered 2020-11-26: 500 mL via INTRAVENOUS

## 2020-11-26 MED ORDER — LIDOCAINE VISCOUS HCL 2 % MT SOLN
OROMUCOSAL | Status: AC | PRN
  Administered 2020-11-26: 15 mL via OROMUCOSAL

## 2020-11-26 MED ORDER — HEPARIN SODIUM (PORCINE) 5000 UNIT/ML IJ SOLN
5000.0000 [IU] | Freq: Three times a day (TID) | INTRAMUSCULAR | Status: DC
  Administered 2020-11-26 – 2020-11-30 (×13): 5000 [IU] via SUBCUTANEOUS
  Filled 2020-11-26 (×13): qty 1

## 2020-11-26 MED ORDER — ATORVASTATIN CALCIUM 20 MG PO TABS
40.0000 mg | ORAL_TABLET | Freq: Every day | ORAL | Status: DC
  Administered 2020-11-27 – 2020-11-29 (×4): 40 mg via ORAL
  Filled 2020-11-26 (×4): qty 2

## 2020-11-26 MED ORDER — MYCOPHENOLATE MOFETIL 250 MG PO CAPS
1000.0000 mg | ORAL_CAPSULE | Freq: Two times a day (BID) | ORAL | Status: DC
  Administered 2020-11-27 – 2020-11-28 (×4): 1000 mg via ORAL
  Filled 2020-11-26 (×5): qty 4

## 2020-11-26 MED ORDER — PANTOPRAZOLE SODIUM 40 MG PO TBEC
40.0000 mg | DELAYED_RELEASE_TABLET | Freq: Every day | ORAL | Status: DC
  Administered 2020-11-27 – 2020-11-30 (×4): 40 mg via ORAL
  Filled 2020-11-26 (×4): qty 1

## 2020-11-26 MED ORDER — ONDANSETRON HCL 4 MG/2ML IJ SOLN
4.0000 mg | Freq: Four times a day (QID) | INTRAMUSCULAR | Status: DC | PRN
  Filled 2020-11-26: qty 2

## 2020-11-26 MED ORDER — MELATONIN 5 MG PO TABS
10.0000 mg | ORAL_TABLET | Freq: Every day | ORAL | Status: DC
  Administered 2020-11-27 – 2020-11-29 (×4): 10 mg via ORAL
  Filled 2020-11-26 (×4): qty 2

## 2020-11-26 MED ORDER — ASPIRIN EC 81 MG PO TBEC
81.0000 mg | DELAYED_RELEASE_TABLET | Freq: Every day | ORAL | Status: DC
  Administered 2020-11-27 – 2020-11-30 (×4): 81 mg via ORAL
  Filled 2020-11-26 (×4): qty 1

## 2020-11-26 MED ORDER — LIDOCAINE VISCOUS HCL 2 % MT SOLN
OROMUCOSAL | Status: AC
  Filled 2020-11-26: qty 15

## 2020-11-26 MED ORDER — ACETAMINOPHEN 650 MG RE SUPP: 650.0000 mg | Freq: Four times a day (QID) | RECTAL | Status: DC | PRN

## 2020-11-26 MED ORDER — ACETAMINOPHEN 325 MG PO TABS: 650.0000 mg | ORAL_TABLET | Freq: Four times a day (QID) | ORAL | Status: DC | PRN

## 2020-11-26 NOTE — ED Notes (Signed)
Pt in CT.

## 2020-11-26 NOTE — ED Notes (Signed)
Report received from [facility]. Patient care assumed. Patient/RN introduction complete. Will continue to monitor.

## 2020-11-26 NOTE — ED Notes (Signed)
Assisted patient to the restroom with walker. Patient now resting comfortably in bed. Son at bedside. Call light in reach. Fall precautions in place.

## 2020-11-26 NOTE — ED Notes (Signed)
Pt declined ambulation even with a walker due to fatigue.

## 2020-11-26 NOTE — Progress Notes (Signed)
Initial Nutrition Assessment  DOCUMENTATION CODES:   Not applicable  INTERVENTION:   Once G-J tube unclogged, recommend:  Nocturnal tube feeds of Nepro 1.8 @ 9ml/hr x 14hrs (run from 1800-0800)  Free water flushes 59ml q4 hours to maintain tube patency   Regimen provides 2142kcal/day, 96g/day protein and 1055ml/day free water   Rena-vit daily via tube   NUTRITION DIAGNOSIS:   Inadequate oral intake related to poor appetite as evidenced by other (comment) (pt with G-J tube and requires nutrional support to meet his nutritional needs)  GOAL:   Patient will meet greater than or equal to 90% of their needs  MONITOR:   PO intake, Labs, Weight trends, TF tolerance, Skin, I & O's  REASON FOR ASSESSMENT:   Consult Enteral/tube feeding initiation and management  ASSESSMENT:   65 y.o. male with medical history significant of hypertension, hyperlipidemia, GERD, depression with anxiety, CAD, stent placement, NASH liver cirrhosis with ascites (s/p liver transplant on 02/18/20; hospital course complicated by AKI requring HD, respiratory failure requiring prolonged intubation and trach placement, cardiac arrest, Klebsiella HAP, malnutrition s/p G-J tube placement on 03/03/20 with slow gastric motility requiring indefinite G tube venting, pancreatic pseudocyst s/p AXIOS stent placement 11/14 (removed 05/14/20), chyle leak requiring drain placement, anemia requiring intermittent transfusions and left humeral spiral fracture following mechanical fall s/p ORIF 03/15/20), ESRD on HD, OSA not on CPAP and DM who is admitted with G-J tube malfunction.  -Pt s/p IR placement of new 18 French GJ tube 8/15 positioned in the proximal jejunum  Unable to see pt as pt remains in the ED. RD familiar with this pt from his recent previous admit. Pt with G-J tube in place since November 2021 (replaced 8/15 secondary to clog). Spoke with pt via his cellular phone. Pt reports that he continues to use Novasource  renal at home. Pt is on nocturnal feeds of 81ml/hr x 12-13 hrs. Pt reports that he gets a total of 930ml of tube feeds which provides 1900kcal/day and 88g/day protein. Pt reports that he continues to have poor oral intake at baseline. Pt reports that he does not tolerate supplements as they make him vomit. Per chart, pt appears to have lost from 233lbs to 143lbs from July of 2021-November 2021; this is a 90lb(39%) weight loss which is severe. Pt does appear to have regained 25lbs from November 2022 to June 2022 but does appear to be down 10lbs(6%) over the past 2 months; pt does endorse this weight loss. Pt does not have a measured weight in yet from this admission. RD will resume nocturnal tube feeds once G-J is unclogged; will use Nepro as Novasource is not on formulary at [facility]. Pt is at high risk for malnutrition. RD will obtain NFPE at follow-up.   Medications reviewed and include: plavix, heparin, solu-medrol  Labs reviewed: creat 1.49(H)- 8/18 Hgb 9.0(L), Hct 26.4(L)- 8/18  NUTRITION - FOCUSED PHYSICAL EXAM: Unable to perform at this time   Diet Order:   Diet Order     None      EDUCATION NEEDS:   Education needs have been addressed  Skin:   not assessed   Last BM:  pta  Height:   Ht Readings from Last 1 Encounters:  11/25/20 6\' 1"  (1.854 m)    Weight:   Wt Readings from Last 1 Encounters:  11/25/20 72.6 kg    Ideal Body Weight:  83.6 kg  BMI:  Body mass index is 21.11 kg/m.  Estimated Nutritional Needs:   Kcal:  2200-2500kcal/day  Protein:  110-125g/day  Fluid:  UOP +1L  Koleen Distance MS, RD, LDN Please refer to [facility] for RD and/or RD on-call/weekend/after hours pager

## 2020-11-26 NOTE — ED Provider Notes (Signed)
[facility] Emergency Department Provider Note   ____________________________________________   Event Date/Time   First MD Initiated Contact with Patient 11/26/20 0024     (approximate)  I have reviewed the triage vital signs and the nursing notes.   HISTORY  Chief Complaint Weakness  History obtained via patient and his son  HPI Alex Hampton is a 65 y.o. male brought to the ED from home by his son with a chief complaint of confusion, generalized weakness, decreased oral intake and hallucinations.  Patient with a history of liver transplant on immunosuppressants, ESRD on HD T/TH/SAT, DM type II, GJ tube feedings he was seen in the ED earlier this week for clogged GJ tube.  Had that replaced by IR several days ago with a temporary 2 but patient and son still feel tube may be clogged as it was not the same tube patient had previously.  He is supposed to be called by radiology for permanent tube replacement.  Has also had several recent falls, most recently last week and diagnosed with compression fracture.  Son states patient felt too bad to go to dialysis on Tuesday, but did receive full dialysis yesterday.  Patient reports chronic cough without fever, chest pain, shortness of breath.  Vomited once yesterday without abdominal pain, dysuria or diarrhea.     Past Medical History:  Diagnosis Date   Depression    History of cardiac cath    History of heart artery stent    Hyperlipidemia    Hypertension    MI (myocardial infarction) ([place])    Renal disorder     Patient Active Problem List   Diagnosis Date Noted   Generalized weakness 11/26/2020   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[place]) 11/26/2020   Frequent falls 11/26/2020   Syncope 10/15/2020   Uremia 10/10/2020   ESRD on hemodialysis ([place]) 58/85/0277   Acute metabolic encephalopathy 41/28/7867   Hyperkalemia 10/10/2020   Elevated troponin 10/10/2020   GERD (gastroesophageal reflux disease) 10/10/2020    CAD (coronary artery disease) 10/10/2020   G tube feedings ([place]) 10/10/2020   Diarrhea 10/10/2020   Type II diabetes mellitus with renal manifestations ([place]) 10/10/2020   Weakness    ACS (acute coronary syndrome) ([place])    Liver transplant recipient [facility])    Anemia of chronic disease    Type 2 diabetes mellitus with diabetic neuropathy, with long-term current use of insulin ([place])    Chest pain 08/19/2020   ESRD (end stage renal disease) ([place]) 08/19/2020   History of anemia due to chronic kidney disease 08/19/2020   Gait abnormality 10/17/2019   History of fall within past 90 days 10/17/2019   Hallucinations 10/17/2019   Cirrhosis of liver with ascites ([place]) on CT 10/17/2019   HTN (hypertension) 10/17/2019   History of MI (myocardial infarction) 10/17/2019   OSA (obstructive sleep apnea) 10/17/2019   Nondisplaced comminuted supracondylar fracture without intercondylar fracture of left humerus, subsequent encounter for fracture with nonunion 10/17/2019   Anxiety and depression 10/17/2019   Confusion and disorientation 10/17/2019    Past Surgical History:  Procedure Laterality Date   IR GJ TUBE CHANGE  11/22/2020   IR [place] GASTRO/COLONIC TUBE PERCUT W/FLUORO  10/14/2020   LEFT HEART CATH AND CORONARY ANGIOGRAPHY N/A 08/19/2020   Procedure: LEFT HEART CATH AND CORONARY ANGIOGRAPHY;  Surgeon: Corey Skains, MD;  Location: [place] [facility];  Service: Cardiovascular;  Laterality: N/A;   LIVER TRANSPLANT      Prior to Admission medications  Medication Sig Start Date End Date Taking? Authorizing Provider  acetaminophen (TYLENOL) 325 MG tablet Take 650 mg by mouth every 8 (eight) hours as needed for mild pain or moderate pain.   Yes [provider]  aspirin 81 MG EC tablet Take 81 mg by mouth daily.   Yes [provider]  atorvastatin (LIPITOR) 40 MG tablet Take 40 mg by mouth at bedtime.   Yes [provider]  clopidogrel (PLAVIX) 75 MG tablet Take 1  tablet (75 mg total) by mouth daily. 08/20/20  Yes Wieting, Anish, MD  docusate (COLACE) 50 MG/5ML liquid Take 100 mg by mouth 2 (two) times daily.   Yes [provider]  escitalopram (LEXAPRO) 20 MG tablet Take 20 mg by mouth daily. 08/06/20  Yes [provider]  gabapentin (NEURONTIN) 100 MG capsule Take 100 mg by mouth every 12 (twelve) hours. 11/10/20  Yes [provider]  melatonin 3 MG TABS tablet Take 9 mg by mouth at bedtime.   Yes [provider]  metoCLOPramide (REGLAN) 5 MG tablet Take 5 mg by mouth 3 (three) times daily before meals.   Yes [provider]  metoprolol tartrate (LOPRESSOR) 25 MG tablet Take 1 tablet (25 mg total) by mouth 2 (two) times daily. 08/20/20  Yes Wieting, Raffaele, MD  midodrine (PROAMATINE) 10 MG tablet Take 10 mg by mouth 3 (three) times a week. 11/17/20  Yes [provider]  mirtazapine (REMERON) 15 MG tablet Take 15 mg by mouth at bedtime. 11/10/20  Yes [provider]  nitroGLYCERIN (NITROSTAT) 0.4 MG SL tablet Place 1 tablet (0.4 mg total) under the tongue every 5 (five) minutes as needed for chest pain. 08/20/20  Yes Wieting, Mackinley, MD  NOVOLIN R 100 UNIT/ML injection Inject 0-11 Units into the skin See admin instructions. Take 6 units at 6pm plus correction with meal time. Correction scale with meals.  Glucose Range  <200 none, 201 - 250 mg/dL 1 unit, 251 - 300 mg/dL 2 units, 301 - 350 mg/dL 3 units, 351- 400 mg/dl 4 units , >400 mg/dL take 5 units and call your provider   Yes [provider]  ondansetron (ZOFRAN-ODT) 4 MG disintegrating tablet Take 4 mg by mouth 2 (two) times daily as needed for nausea.   Yes [provider]  pantoprazole (PROTONIX) 40 MG tablet Take 1 tablet (40 mg total) by mouth daily. 10/14/20  Yes Enzo Bi, MD  predniSONE (DELTASONE) 5 MG tablet Take 15 mg by mouth daily.   Yes [provider]  senna-docusate (SENOKOT-S) 8.6-50 MG tablet Take 2 tablets  by mouth 2 (two) times daily as needed for mild constipation or moderate constipation. 10/14/20  Yes Enzo Bi, MD  sulfamethoxazole-trimethoprim (BACTRIM DS) 800-160 MG tablet Take 1 tablet by mouth every Monday, Wednesday, and Friday. 08/06/20  Yes [provider]  tacrolimus (PROGRAF) 1 MG capsule Take 7 mg by mouth 2 (two) times daily.   Yes [provider]  traZODone (DESYREL) 150 MG tablet Take 150 mg by mouth at bedtime.   Yes [provider]  valGANciclovir (VALCYTE) 450 MG tablet Take 450 mg by mouth 2 (two) times a week. (Monday and Thursday) with largest meal of the day.   Yes [provider]  amoxicillin (AMOXIL) 500 MG tablet Take 1,000 mg by mouth 2 (two) times daily.    [provider]  clarithromycin (BIAXIN) 500 MG tablet Take 1,000 mg by mouth 2 (two) times daily.    [provider]  hydrOXYzine (VISTARIL) 25 MG capsule Take 25 mg by mouth every 6 (six) hours as needed for anxiety or itching. Patient not taking: Reported on 11/26/2020    [provider]  lidocaine (LIDODERM) 5 % Place 1 patch onto the skin every 12 (twelve) hours. Remove & Discard patch within 12 hours or as directed by MD Patient not taking: Reported on 11/26/2020 11/09/20 11/09/21  Blake Divine, MD  mycophenolate (CELLCEPT) 250 MG capsule Take 1,000 mg by mouth 2 (two) times daily. Patient not taking: Reported on 11/26/2020    [provider]  Nutritional Supplements (NOVASOURCE RENAL) LIQD Give by tube. Novasource Renal @ 75 ml/hr 8 hours /night (10 pm -8 am)    [provider]    Allergies Patient has no known allergies.  Family History  Problem Relation Age of Onset   Diabetes Mellitus II Mother    Pancreatic cancer Father     Social History Social History   Tobacco Use   Smoking status: Former   Smokeless tobacco: Never  Substance Use Topics   Alcohol use: Yes    Comment: occasionally   Drug use: Never    Review of  Systems  Constitutional: Positive for generalized weakness and decreased oral intake.  Positive for recurrent falls.  No fever/chills Eyes: No visual changes. ENT: No sore throat. Cardiovascular: Denies chest pain. Respiratory: Positive for cough.  Denies shortness of breath. Gastrointestinal: No abdominal pain.  Positive for vomiting.  No diarrhea.  No constipation. Genitourinary: Negative for dysuria. Musculoskeletal: Negative for back pain. Skin: Negative for rash. Neurological: Negative for headaches, focal weakness or numbness.   ____________________________________________   PHYSICAL EXAM:  VITAL SIGNS: ED Triage Vitals  Enc Vitals Group     BP 11/25/20 1736 (!) 136/102     Pulse Rate 11/25/20 1736 70     Resp 11/25/20 1736 18     Temp 11/25/20 1736 98.6 F (37 C)     Temp Source 11/25/20 1736 Oral     SpO2 11/25/20 1736 99 %     Weight 11/25/20 1739 160 lb (72.6 kg)     Height 11/25/20 1739 6\' 1"  (1.854 m)     Head Circumference --      Peak Flow --      Pain Score 11/25/20 1738 7     Pain Loc --      Pain Edu? --      Excl. in [place]? --     Constitutional: Alert and oriented.  Chronically ill appearing and in no acute distress. Eyes: Conjunctivae are normal. PERRL. EOMI. Head: Atraumatic. Nose: No congestion/rhinnorhea. Mouth/Throat: Mucous membranes are mildly dry. Neck: No stridor.   Cardiovascular: Normal rate, regular rhythm. Grossly normal heart sounds.  Good peripheral circulation. Respiratory: Normal respiratory effort.  No retractions. Lungs CTAB. Gastrointestinal: Soft and nontender to light or deep palpation.  GJ tube noted.  No distention. No abdominal bruits. No CVA tenderness. Musculoskeletal: No lower extremity tenderness nor edema.  No joint effusions. Neurologic:  Normal speech and language. No gross focal neurologic deficits are appreciated.  Skin:  Skin is warm, dry and intact. No rash noted. Psychiatric: Mood and affect are normal. Speech and  behavior are normal.  ____________________________________________   LABS (all labs ordered are listed, but only abnormal results are displayed)  Labs Reviewed  BASIC METABOLIC PANEL - Abnormal; Notable for the following components:      Result Value   Glucose, Bld 195 (*)    Creatinine,  Ser 1.49 (*)    Calcium 8.6 (*)    GFR, Estimated 51 (*)    All other components within normal limits  CBC - Abnormal; Notable for the following components:   RBC 2.62 (*)    Hemoglobin 9.0 (*)    HCT 26.4 (*)    MCV 100.8 (*)    MCH 34.4 (*)    All other components within normal limits  URINALYSIS, COMPLETE (UACMP) WITH MICROSCOPIC - Abnormal; Notable for the following components:   Color, Urine AMBER (*)    APPearance CLOUDY (*)    Bacteria, UA RARE (*)    All other components within normal limits  HEPATIC FUNCTION PANEL - Abnormal; Notable for the following components:   Total Protein 6.2 (*)    All other components within normal limits  TROPONIN I (HIGH SENSITIVITY) - Abnormal; Notable for the following components:   Troponin I (High Sensitivity) 25 (*)    All other components within normal limits  RESP PANEL BY RT-PCR (FLU A&B, COVID) ARPGX2  LACTIC ACID, PLASMA  AMMONIA  PROCALCITONIN  LIPASE, BLOOD  HIV ANTIBODY (ROUTINE TESTING W REFLEX)  CBG MONITORING, ED   ____________________________________________  EKG  ED ECG REPORT I, Leandra Vanderweele J, the attending physician, personally viewed and interpreted this ECG.   Date: 11/26/2020  EKG Time: 1751  Rate: 70  Rhythm: normal sinus rhythm  Axis: Normal  Intervals:none  ST&T Change: Nonspecific  ____________________________________________  RADIOLOGY I, Jaanai Salemi J, personally viewed and evaluated these images (plain radiographs) as part of my medical decision making, as well as reviewing the written report by the radiologist.  ED MD interpretation: No ICH, no acute cardiopulmonary process; GJ tube coiled within the  stomach  Official radiology report(s): DG Abdomen 1 View  Result Date: 11/26/2020 CLINICAL DATA:  Abdominal pain and weakness EXAM: ABDOMEN - 1 VIEW COMPARISON:  None. FINDINGS: Scattered large and small bowel gas is noted. Gastric catheter is noted in place although the jejunal limb appears coiled within the stomach. No obstructive changes are noted. No free air is seen. No acute bony abnormality is noted. IMPRESSION: Jejunal limb of the gastrojejunal catheter appears coiled within the stomach. No obstructive changes are noted Electronically Signed   By: Inez Catalina M.D.   On: 11/26/2020 01:13   CT Head Wo Contrast  Result Date: 11/26/2020 CLINICAL DATA:  Lethargy and weakness. EXAM: CT HEAD WITHOUT CONTRAST TECHNIQUE: Contiguous axial images were obtained from the base of the skull through the vertex without intravenous contrast. COMPARISON:  October 16, 2019 FINDINGS: Brain: There is mild cerebral atrophy with widening of the extra-axial spaces and ventricular dilatation. There are areas of decreased attenuation within the white matter tracts of the supratentorial brain, consistent with microvascular disease changes. Vascular: No hyperdense vessel or unexpected calcification. Skull: Normal. Negative for fracture or focal lesion. Sinuses/Orbits: No acute finding. Other: None. IMPRESSION: 1. No acute intracranial abnormality. 2. Generalized cerebral atrophy. Electronically Signed   By: Virgina Norfolk M.D.   On: 11/26/2020 01:54   DG Chest Port 1 View  Result Date: 11/26/2020 CLINICAL DATA:  Weakness EXAM: PORTABLE CHEST 1 VIEW COMPARISON:  11/09/2020 FINDINGS: Cardiac shadow is stable. Dialysis catheter is again seen. The lungs are well aerated bilaterally. Skin fold is noted over the left chest. No acute bony abnormality is seen. Postsurgical changes are noted in the proximal left humerus. IMPRESSION: No acute abnormality noted. Electronically Signed   By: Inez Catalina M.D.   On: 11/26/2020 01:09  ____________________________________________   PROCEDURES  Procedure(s) performed (including Critical Care):  .1-3 Lead EKG Interpretation  Date/Time: 11/26/2020 12:55 AM Performed by: Paulette Blanch, MD Authorized by: Paulette Blanch, MD     Interpretation: normal     ECG rate:  70   ECG rate assessment: normal     Rhythm: sinus rhythm     Ectopy: none     Conduction: normal   Comments:     Patient placed on cardiac monitor to evaluate for arrhythmias   ____________________________________________   INITIAL IMPRESSION / ASSESSMENT AND PLAN / ED COURSE  As part of my medical decision making, I reviewed the following data within the electronic MEDICAL RECORD NUMBER History obtained from family, Nursing notes reviewed and incorporated, Labs reviewed, EKG interpreted, Old chart reviewed, Radiograph reviewed, and Notes from prior ED visits     65 year old male presenting with generalized weakness, decreased oral intake, recurrent falls, now with hallucinations.  History of ESRD on HD, status post liver transplant. Differential diagnosis includes, but is not limited to, alcohol, illicit or prescription medications, or other toxic ingestion; intracranial pathology such as stroke or intracerebral hemorrhage; fever or infectious causes including sepsis; hypoxemia and/or hypercarbia; uremia; trauma; endocrine related disorders such as diabetes, hypoglycemia, and thyroid-related diseases; hypertensive encephalopathy; etc.   Laboratory results thus far unremarkable with stable anemia and improved creatinine as patient recently received dialysis.  Will check sepsis markers, ammonia level, LFTs/lipase, respiratory panel, CT head, imaging of chest and abdomen.  Administer judicious fluids.  Ambulation trial, orthostatic vital signs and reassess.  Clinical Course as of 11/26/20 0556  Fri Nov 26, 2020  0220 Patient could not get up for ambulation trial secondary to weakness. [JS]  [id number] Updated patient  of all test results.  Will discuss with hospitalist services for admission.  Will require IR to address GJ tube coiling. [JS]    Clinical Course User Index [JS] Paulette Blanch, MD     ____________________________________________   FINAL CLINICAL IMPRESSION(S) / ED DIAGNOSES  Final diagnoses:  Generalized weakness  Feeding tube dysfunction, subsequent encounter  Recurrent falls     ED Discharge Orders     None        Note:  This document was prepared using Dragon voice recognition software and may include unintentional dictation errors.    Paulette Blanch, MD 11/26/20 [phone]

## 2020-11-26 NOTE — ED Notes (Signed)
Orthostatic VS:  Lying: 127/77, 77, 98% Sitting: 136/78, 72, 99% Standing: 120/70, 71, 98%

## 2020-11-26 NOTE — Progress Notes (Addendum)
PROGRESS NOTE  Alex Hampton:[id number] DOB: 11-Sep-1955 DOA: 11/26/2020 PCP: Juluis Pitch, MD  HPI/Recap of past 43 hours: 65 year old male with past medical history significant for diabetes mellitus, end-stage renal disease on hemodialysis, Alex Hampton cirrhosis status post liver transplant in November 2021, CAD status post stent GJ tube and obstructive sleep apnea not on CPAP and recently replaced GJ tube by interventional radiology several days ago, presented to the emergency room on 8/19 with complaints of several days of weakness, falls.  Patient found to have dehydration and malfunctioning GJ tube.  Patient admitted earlier this morning.  Seen later in the morning and complains of some chronic low back pain.  Assessment/Plan: Principal Problem:   Generalized weakness: Felt to be from deconditioning.  PT consulted. Active Problems:   OSA (obstructive sleep apnea): Not on CPAP.    Liver transplant recipient [facility]): Clarifying patient's medications.  He says that he is no longer on Prograf or CellCept.   Anemia of chronic disease: Stable.  Hemoglobin about the same from several weeks ago    Type 2 diabetes mellitus with diabetic neuropathy, with long-term current use of insulin ([place])   ESRD on hemodialysis [facility]): Nephrology consulted.    CAD (coronary artery disease): Stable.    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[place]): Interventional radiology consulted. Nutrition followed up with the following recommendations: Patient is on nocturnal tube feeds at home. He will receive Nepro 1.8@85ml /hr x 14 hrs (1800-0800). He eats by mouth only for pleasure. I know his tube is currently clogged. I left a note in with recommendations for once the tube gets unclogged.  He is likely needs to be educated about flushing that tube every 4 hours at home and probably needs to be shown how to put meds down correctly if he is using it for that because his tube has been clogged and replaced numerous times.   Code  Status: Full code  Family Communication: Left message for family  Disposition Plan: Anticipate discharge in next few days after seen by IR and PT   Consultants: Interventional radiology Nephrology  Procedures: Scheduled hemodialysis Dependent on interventional radiology plans for GJ tube  Antimicrobials: None  DVT prophylaxis: Lovenox  Level of care: Med-Surg   Objective: Vitals:   11/26/20 1100 11/26/20 1300  BP: 120/68 (!) 154/71  Pulse: (!) 55 (!) 57  Resp: 12 16  Temp:    SpO2: 100% 100%    Intake/Output Summary (Last 24 hours) at 11/26/2020 1504 Last data filed at 11/26/2020 0224 Gross per 24 hour  Intake 500 ml  Output --  Net 500 ml   Filed Weights   11/25/20 1739  Weight: 72.6 kg   Body mass index is 21.11 kg/m.  Exam:  General: Alert and oriented x3, no acute distress HEENT: Normocephalic and atraumatic, mucous membranes are moist Cardiovascular: Regular rate and rhythm, S1-S2 Respiratory: Clear to auscultation bilaterally Abdomen: Soft, nontender, nondistended, GJ tube noted Musculoskeletal: No clubbing or cyanosis or edema Skin: No skin breaks, tears or lesions Psychiatry: Appropriate, no evidence of psychoses Neurology: No focal deficits   Data Reviewed: CBC: Recent Labs  Lab 11/25/20 1746  WBC 6.2  HGB 9.0*  HCT 26.4*  MCV 100.8*  PLT 423   Basic Metabolic Panel: Recent Labs  Lab 11/25/20 1746  NA 135  K 3.8  CL 98  CO2 27  GLUCOSE 195*  BUN 19  CREATININE 1.49*  CALCIUM 8.6*   GFR: Estimated Creatinine Clearance: 49.4 mL/min (A) (by C-G formula based on  SCr of 1.49 mg/dL (H)). Liver Function Tests: Recent Labs  Lab 11/25/20 1746  AST 20  ALT 15  ALKPHOS 126  BILITOT 0.5  PROT 6.2*  ALBUMIN 3.6   Recent Labs  Lab 11/25/20 1746  LIPASE 20   Recent Labs  Lab 11/26/20 0110  AMMONIA 10   Coagulation Profile: No results for input(s): INR, PROTIME in the last 168 hours. Cardiac Enzymes: No results for  input(s): CKTOTAL, CKMB, CKMBINDEX, TROPONINI in the last 168 hours. BNP (last 3 results) No results for input(s): PROBNP in the last 8760 hours. HbA1C: No results for input(s): HGBA1C in the last 72 hours. CBG: No results for input(s): GLUCAP in the last 168 hours. Lipid Profile: No results for input(s): CHOL, HDL, LDLCALC, TRIG, CHOLHDL, LDLDIRECT in the last 72 hours. Thyroid Function Tests: No results for input(s): TSH, T4TOTAL, FREET4, T3FREE, THYROIDAB in the last 72 hours. Anemia Panel: No results for input(s): VITAMINB12, FOLATE, FERRITIN, TIBC, IRON, RETICCTPCT in the last 72 hours. Urine analysis:    Component Value Date/Time   COLORURINE AMBER (A) 11/25/2020 1746   APPEARANCEUR CLOUDY (A) 11/25/2020 1746   LABSPEC 1.016 11/25/2020 1746   PHURINE 5.0 11/25/2020 1746   GLUCOSEU NEGATIVE 11/25/2020 1746   HGBUR NEGATIVE 11/25/2020 1746   BILIRUBINUR NEGATIVE 11/25/2020 1746   KETONESUR NEGATIVE 11/25/2020 1746   PROTEINUR NEGATIVE 11/25/2020 1746   NITRITE NEGATIVE 11/25/2020 1746   LEUKOCYTESUR NEGATIVE 11/25/2020 1746   Sepsis Labs: @LABRCNTIP (procalcitonin:4,lacticidven:4)  ) Recent Results (from the past 240 hour(s))  Resp Panel by RT-PCR (Flu A&B, Covid) Nasopharyngeal Swab     Status: None   Collection Time: 11/26/20 12:58 AM   Specimen: Nasopharyngeal Swab; Nasopharyngeal(NP) swabs in vial transport medium  Result Value Ref Range Status   SARS Coronavirus 2 by RT PCR NEGATIVE NEGATIVE Final    Comment: (NOTE) SARS-CoV-2 target nucleic acids are NOT DETECTED.  The SARS-CoV-2 RNA is generally detectable in upper respiratory specimens during the acute phase of infection. The lowest concentration of SARS-CoV-2 viral copies this assay can detect is 138 copies/mL. A negative result does not preclude SARS-Cov-2 infection and should not be used as the sole basis for treatment or other patient management decisions. A negative result may occur with  improper  specimen collection/handling, submission of specimen other than nasopharyngeal swab, presence of viral mutation(s) within the areas targeted by this assay, and inadequate number of viral copies(<138 copies/mL). A negative result must be combined with clinical observations, patient history, and epidemiological information. The expected result is Negative.  Fact Sheet for Patients:  EntrepreneurPulse.com.au  Fact Sheet for Healthcare Providers:  IncredibleEmployment.be  This test is no t yet approved or cleared by the [place] FDA and  has been authorized for detection and/or diagnosis of SARS-CoV-2 by FDA under an Emergency Use Authorization (EUA). This EUA will remain  in effect (meaning this test can be used) for the duration of the COVID-19 declaration under Section 564(b)(1) of the Act, 21 U.S.C.section 360bbb-3(b)(1), unless the authorization is terminated  or revoked sooner.       Influenza A by PCR NEGATIVE NEGATIVE Final   Influenza B by PCR NEGATIVE NEGATIVE Final    Comment: (NOTE) The Xpert Xpress SARS-CoV-2/FLU/RSV plus assay is intended as an aid in the diagnosis of influenza from Nasopharyngeal swab specimens and should not be used as a sole basis for treatment. Nasal washings and aspirates are unacceptable for Xpert Xpress SARS-CoV-2/FLU/RSV testing.  Fact Sheet for Patients: EntrepreneurPulse.com.au  Fact Sheet  for Healthcare Providers: IncredibleEmployment.be  This test is not yet approved or cleared by the [place] and has been authorized for detection and/or diagnosis of SARS-CoV-2 by FDA under an Emergency Use Authorization (EUA). This EUA will remain in effect (meaning this test can be used) for the duration of the COVID-19 declaration under Section 564(b)(1) of the Act, 21 U.S.C. section 360bbb-3(b)(1), unless the authorization is terminated or revoked.  Performed at  [facility], [address] [place], [place] [address]       Studies: DG Abdomen 1 View  Result Date: 11/26/2020 CLINICAL DATA:  Abdominal pain and weakness EXAM: ABDOMEN - 1 VIEW COMPARISON:  None. FINDINGS: Scattered large and small bowel gas is noted. Gastric catheter is noted in place although the jejunal limb appears coiled within the stomach. No obstructive changes are noted. No free air is seen. No acute bony abnormality is noted. IMPRESSION: Jejunal limb of the gastrojejunal catheter appears coiled within the stomach. No obstructive changes are noted Electronically Signed   By: Inez Catalina M.D.   On: 11/26/2020 01:13   CT Head Wo Contrast  Result Date: 11/26/2020 CLINICAL DATA:  Lethargy and weakness. EXAM: CT HEAD WITHOUT CONTRAST TECHNIQUE: Contiguous axial images were obtained from the base of the skull through the vertex without intravenous contrast. COMPARISON:  October 16, 2019 FINDINGS: Brain: There is mild cerebral atrophy with widening of the extra-axial spaces and ventricular dilatation. There are areas of decreased attenuation within the white matter tracts of the supratentorial brain, consistent with microvascular disease changes. Vascular: No hyperdense vessel or unexpected calcification. Skull: Normal. Negative for fracture or focal lesion. Sinuses/Orbits: No acute finding. Other: None. IMPRESSION: 1. No acute intracranial abnormality. 2. Generalized cerebral atrophy. Electronically Signed   By: Virgina Norfolk M.D.   On: 11/26/2020 01:54   DG Chest Port 1 View  Result Date: 11/26/2020 CLINICAL DATA:  Weakness EXAM: PORTABLE CHEST 1 VIEW COMPARISON:  11/09/2020 FINDINGS: Cardiac shadow is stable. Dialysis catheter is again seen. The lungs are well aerated bilaterally. Skin fold is noted over the left chest. No acute bony abnormality is seen. Postsurgical changes are noted in the proximal left humerus. IMPRESSION: No acute abnormality noted. Electronically Signed    By: Inez Catalina M.D.   On: 11/26/2020 01:09    Scheduled Meds:  clopidogrel  75 mg Oral Daily   heparin  5,000 Units Subcutaneous Q8H   lidocaine  1 patch Transdermal Q12H   methylPREDNISolone (SOLU-MEDROL) injection  20 mg Intravenous Daily    Continuous Infusions:   LOS: 0 days     Annita Brod, MD [facility]   11/26/2020, 3:04 PM

## 2020-11-26 NOTE — ED Notes (Signed)
Pt back from CT

## 2020-11-26 NOTE — ED Notes (Signed)
Patient requests to speak to hospitalist about possibly being transferred to [facility] as all of his care is through them. Message sent to hospitalist.

## 2020-11-26 NOTE — Progress Notes (Signed)
[facility]  ROUNDING NOTE   Subjective:   Alex Hampton is a 65 y.o. male with past medical history of hypertension, liver transplant at [facility] (2021), hypertension, and ESRD on HD. Patient presents to ED with complaints of nausea, vomiting, diarrhea and malaise. He will be admitted for Generalized weakness [R53.1]  Patient is know to this practice and receives dialysis treatments at [facility] on a TTS schedule under the supervision of Dr Holley Raring. He states he has had this condition for a few months and it has gotten worse over the past few days. He states he has diarrhea and stools once a day. He has a GJ tube, recently replaced. Abdominal X-ray shows J limb of GJ tube coiled in stomach.  Denies cough and shortness of breath. States he did receive a full dialysis treatment yesterday. We have been consulted to manage his dialysis during this admission.    Objective:  Vital signs in last 24 hours:  Temp:  [98.6 F (37 C)] 98.6 F (37 C) (08/18 1736) Pulse Rate:  [55-70] 57 (08/19 1300) Resp:  [12-21] 16 (08/19 1300) BP: (116-154)/(66-102) 154/71 (08/19 1300) SpO2:  [98 %-100 %] 100 % (08/19 1300) Weight:  [72.6 kg] 72.6 kg (08/18 1739)  Weight change:  Filed Weights   11/25/20 1739  Weight: 72.6 kg    Intake/Output: I/O last 3 completed shifts: In: 500 [IV Piggyback:500] Out: -    Intake/Output this shift:  No intake/output data recorded.  Physical Exam: General: NAD, laying in bed  Head: Normocephalic, atraumatic. Moist oral mucosal membranes  Eyes: Anicteric  Lungs:  Clear to auscultation, normal effort  Heart: Regular rate and rhythm  Abdomen:  Soft, nontender  Extremities:  no peripheral edema.  Neurologic: Nonfocal, moving all four extremities  Skin: No lesions  Access: Rt Permcath    Basic Metabolic Panel: Recent Labs  Lab 11/25/20 1746  NA 135  K 3.8  CL 98  CO2 27  GLUCOSE 195*  BUN 19  CREATININE 1.49*  CALCIUM 8.6*    Liver  Function Tests: Recent Labs  Lab 11/25/20 1746  AST 20  ALT 15  ALKPHOS 126  BILITOT 0.5  PROT 6.2*  ALBUMIN 3.6   Recent Labs  Lab 11/25/20 1746  LIPASE 20   Recent Labs  Lab 11/26/20 0110  AMMONIA 10    CBC: Recent Labs  Lab 11/25/20 1746  WBC 6.2  HGB 9.0*  HCT 26.4*  MCV 100.8*  PLT 162    Cardiac Enzymes: No results for input(s): CKTOTAL, CKMB, CKMBINDEX, TROPONINI in the last 168 hours.  BNP: Invalid input(s): POCBNP  CBG: No results for input(s): GLUCAP in the last 168 hours.  Microbiology: Results for orders placed or performed during the hospital encounter of 11/26/20  Resp Panel by RT-PCR (Flu A&B, Covid) Nasopharyngeal Swab     Status: None   Collection Time: 11/26/20 12:58 AM   Specimen: Nasopharyngeal Swab; Nasopharyngeal(NP) swabs in vial transport medium  Result Value Ref Range Status   SARS Coronavirus 2 by RT PCR NEGATIVE NEGATIVE Final    Comment: (NOTE) SARS-CoV-2 target nucleic acids are NOT DETECTED.  The SARS-CoV-2 RNA is generally detectable in upper respiratory specimens during the acute phase of infection. The lowest concentration of SARS-CoV-2 viral copies this assay can detect is 138 copies/mL. A negative result does not preclude SARS-Cov-2 infection and should not be used as the sole basis for treatment or other patient management decisions. A negative result may occur  with  improper specimen collection/handling, submission of specimen other than nasopharyngeal swab, presence of viral mutation(s) within the areas targeted by this assay, and inadequate number of viral copies(<138 copies/mL). A negative result must be combined with clinical observations, patient history, and epidemiological information. The expected result is Negative.  Fact Sheet for Patients:  EntrepreneurPulse.com.au  Fact Sheet for Healthcare Providers:  IncredibleEmployment.be  This test is no t yet approved or  cleared by the [place] FDA and  has been authorized for detection and/or diagnosis of SARS-CoV-2 by FDA under an Emergency Use Authorization (EUA). This EUA will remain  in effect (meaning this test can be used) for the duration of the COVID-19 declaration under Section 564(b)(1) of the Act, 21 U.S.C.section 360bbb-3(b)(1), unless the authorization is terminated  or revoked sooner.       Influenza A by PCR NEGATIVE NEGATIVE Final   Influenza B by PCR NEGATIVE NEGATIVE Final    Comment: (NOTE) The Xpert Xpress SARS-CoV-2/FLU/RSV plus assay is intended as an aid in the diagnosis of influenza from Nasopharyngeal swab specimens and should not be used as a sole basis for treatment. Nasal washings and aspirates are unacceptable for Xpert Xpress SARS-CoV-2/FLU/RSV testing.  Fact Sheet for Patients: EntrepreneurPulse.com.au  Fact Sheet for Healthcare Providers: IncredibleEmployment.be  This test is not yet approved or cleared by the [place] FDA and has been authorized for detection and/or diagnosis of SARS-CoV-2 by FDA under an Emergency Use Authorization (EUA). This EUA will remain in effect (meaning this test can be used) for the duration of the COVID-19 declaration under Section 564(b)(1) of the Act, 21 U.S.C. section 360bbb-3(b)(1), unless the authorization is terminated or revoked.  Performed at [facility], [address] [place], [place] [address]     Coagulation Studies: No results for input(s): LABPROT, INR in the last 72 hours.  Urinalysis: Recent Labs    11/25/20 1746  COLORURINE AMBER*  LABSPEC 1.016  PHURINE 5.0  GLUCOSEU NEGATIVE  HGBUR NEGATIVE  BILIRUBINUR NEGATIVE  KETONESUR NEGATIVE  PROTEINUR NEGATIVE  NITRITE NEGATIVE  LEUKOCYTESUR NEGATIVE      Imaging: DG Abdomen 1 View  Result Date: 11/26/2020 CLINICAL DATA:  Abdominal pain and weakness EXAM: ABDOMEN - 1 VIEW COMPARISON:  None.  FINDINGS: Scattered large and small bowel gas is noted. Gastric catheter is noted in place although the jejunal limb appears coiled within the stomach. No obstructive changes are noted. No free air is seen. No acute bony abnormality is noted. IMPRESSION: Jejunal limb of the gastrojejunal catheter appears coiled within the stomach. No obstructive changes are noted Electronically Signed   By: Inez Catalina M.D.   On: 11/26/2020 01:13   CT Head Wo Contrast  Result Date: 11/26/2020 CLINICAL DATA:  Lethargy and weakness. EXAM: CT HEAD WITHOUT CONTRAST TECHNIQUE: Contiguous axial images were obtained from the base of the skull through the vertex without intravenous contrast. COMPARISON:  October 16, 2019 FINDINGS: Brain: There is mild cerebral atrophy with widening of the extra-axial spaces and ventricular dilatation. There are areas of decreased attenuation within the white matter tracts of the supratentorial brain, consistent with microvascular disease changes. Vascular: No hyperdense vessel or unexpected calcification. Skull: Normal. Negative for fracture or focal lesion. Sinuses/Orbits: No acute finding. Other: None. IMPRESSION: 1. No acute intracranial abnormality. 2. Generalized cerebral atrophy. Electronically Signed   By: Virgina Norfolk M.D.   On: 11/26/2020 01:54   DG Chest Port 1 View  Result Date: 11/26/2020 CLINICAL DATA:  Weakness EXAM: PORTABLE CHEST  1 VIEW COMPARISON:  11/09/2020 FINDINGS: Cardiac shadow is stable. Dialysis catheter is again seen. The lungs are well aerated bilaterally. Skin fold is noted over the left chest. No acute bony abnormality is seen. Postsurgical changes are noted in the proximal left humerus. IMPRESSION: No acute abnormality noted. Electronically Signed   By: Inez Catalina M.D.   On: 11/26/2020 01:09     Medications:     clopidogrel  75 mg Oral Daily   heparin  5,000 Units Subcutaneous Q8H   lidocaine  1 patch Transdermal Q12H   methylPREDNISolone (SOLU-MEDROL)  injection  20 mg Intravenous Daily   acetaminophen **OR** acetaminophen, ondansetron **OR** ondansetron (ZOFRAN) IV  Assessment/ Plan:  Alex Hampton is a 65 y.o.  male with past medical history of hypertension, liver transplant at [place] (2021), hypertension, and ESRD on HD. Patient presents to ED with complaints of nausea, vomiting, diarrhea and malaise. He will be admitted for Generalized weakness [R53.1]  [facility] [address]/TTS/Rt Permcath  End stage renal disease on dialysis Will maintain outpatient schedule, if possible Received full dialysis at outpatient clinic prior to ED arrival Next treatment scheduled for tomorrow wit minimal UF  2. Anemia of chronic kidney disease Lab Results  Component Value Date   HGB 9.0 (L) 11/25/2020    Hgb below target Low dose EPO with dialysis  3. Secondary Hyperparathyroidism: Lab Results  Component Value Date   PTH 17 10/11/2020   CALCIUM 8.6 (L) 11/25/2020   PHOS 4.1 10/12/2020    Phosphorus at goal. Will continue to monitor calcium  4. Hypertension: 154/71 Home regimen includes Metoprolol and Midodrine on dialysis days All held at this time. Will assess need for Midodrine with dialysis tomorrow   LOS: 0 Brogen Duell 8/19/20223:10 PM

## 2020-11-26 NOTE — ED Notes (Signed)
Patient transported to IR Radiology.

## 2020-11-26 NOTE — H&P (Signed)
History and Physical    Alex Hampton MGQ:[id number] DOB: 11/20/1955 DOA: 11/26/2020  PCP: Juluis Pitch, MD   Patient coming from: home  I have personally briefly reviewed patient's old medical records in [place]  Chief Complaint: weakness  HPI: Alex Hampton is a 65 y.o. male with medical history significant for HTN, CAD s/p stent, s/p liver transplant at [facility] on 02/18/2020 for NASH , ESRD, on HD TTS, GJ tube, OSA not on CPAP who presents to the ED with generalized weakness resulting in recent falls, sustaining compression fracture on 1 occasion.  Also had an episode of vomiting x1 the day prior but denies abdominal pain or diarrhea, fever or chills.  Patient has a GJ tube that was replaced a few days prior by interventional radiology  He has had no cough, shortness of breath, fever or chills.    Denies headache or visual disturbance or one-sided weakness numbness or tingling.  ED course: On arrival afebrile, BP 136/102, pulse 70 O2 sat 99% on room air Blood work with normal WBC of 6200, lactic acid 1.9 and procalcitonin 0.13.  Hemoglobin 9.0 which is about his baseline lipase 20, LFTs unremarkable.  Urinalysis unremarkable.  Troponin 25, ammonia 10  EKG, personally viewed and interpreted: Sinus at 70 with no acute ST-T wave changes  Imaging: Chest x-ray with no acute disease Abdominal x-ray shows jejunal limb of GJ catheter coiled within the stomach.  No obstructive changes CT head no acute intracranial abnormality  Patient given a 500 mL normal saline bolus.  Hospitalist consulted for admission.  Review of Systems: As per HPI otherwise all other systems on review of systems negative.    Past Medical History:  Diagnosis Date   Depression    History of cardiac cath    History of heart artery stent    Hyperlipidemia    Hypertension    MI (myocardial infarction) ([place])    Renal disorder     Past Surgical History:  Procedure Laterality Date   IR [place] TUBE CHANGE   11/22/2020   IR [place] GASTRO/COLONIC TUBE PERCUT W/FLUORO  10/14/2020   LEFT HEART CATH AND CORONARY ANGIOGRAPHY N/A 08/19/2020   Procedure: LEFT HEART CATH AND CORONARY ANGIOGRAPHY;  Surgeon: Corey Skains, MD;  Location: [facility];  Service: Cardiovascular;  Laterality: N/A;   LIVER TRANSPLANT       reports that he has quit smoking. He has never used smokeless tobacco. He reports current alcohol use. He reports that he does not use drugs.  No Known Allergies  Family History  Problem Relation Age of Onset   Diabetes Mellitus II Mother    Pancreatic cancer Father       Prior to Admission medications   Medication Sig Start Date End Date Taking? Authorizing Provider  acetaminophen (TYLENOL) 325 MG tablet Take 650 mg by mouth every 8 (eight) hours as needed for mild pain or moderate pain.    [provider]  aspirin 81 MG EC tablet Take 81 mg by mouth daily.    [provider]  atorvastatin (LIPITOR) 40 MG tablet Take 40 mg by mouth at bedtime.    [provider]  clopidogrel (PLAVIX) 75 MG tablet Take 1 tablet (75 mg total) by mouth daily. 08/20/20   Loletha Grayer, MD  escitalopram (LEXAPRO) 20 MG tablet Take 20 mg by mouth daily. 08/06/20   [provider]  gabapentin (NEURONTIN) 100 MG capsule Take 100 mg by mouth 2 (two) times  daily.    [provider]  hydrOXYzine (VISTARIL) 25 MG capsule Take 25 mg by mouth every 6 (six) hours as needed for anxiety or itching.    [provider]  lidocaine (LIDODERM) 5 % Place 1 patch onto the skin every 12 (twelve) hours. Remove & Discard patch within 12 hours or as directed by MD 11/09/20 11/09/21  Blake Divine, MD  melatonin 3 MG TABS tablet Take 9 mg by mouth at bedtime.    [provider]  metoCLOPramide (REGLAN) 5 MG tablet Take 5 mg by mouth 3 (three) times daily before meals.    [provider]  metoprolol tartrate (LOPRESSOR) 25 MG tablet Take 1 tablet (25 mg  total) by mouth 2 (two) times daily. 08/20/20   Loletha Grayer, MD  mirtazapine (REMERON) 15 MG tablet Take 15 mg by mouth at bedtime. 08/06/20   [provider]  mycophenolate (CELLCEPT) 250 MG capsule Take 1,000 mg by mouth 2 (two) times daily.    [provider]  nitroGLYCERIN (NITROSTAT) 0.4 MG SL tablet Place 1 tablet (0.4 mg total) under the tongue every 5 (five) minutes as needed for chest pain. 08/20/20   Loletha Grayer, MD  NOVOLIN R 100 UNIT/ML injection Inject 0-11 Units into the skin See admin instructions. Take 6 units at 6pm plus correction with meal time. Correction scale with meals.  Glucose Range  <200 none, 201 - 250 mg/dL 1 unit, 251 - 300 mg/dL 2 units, 301 - 350 mg/dL 3 units, 351- 400 mg/dl 4 units , >400 mg/dL take 5 units and call your provider    [provider]  Nutritional Supplements (NOVASOURCE RENAL) LIQD Give by tube. Novasource Renal @ 75 ml/hr 8 hours /night (10 pm -8 am)    [provider]  pantoprazole (PROTONIX) 40 MG tablet Take 1 tablet (40 mg total) by mouth daily. 10/14/20   Enzo Bi, MD  predniSONE (DELTASONE) 5 MG tablet Take 17.5 mg by mouth daily.    [provider]  senna-docusate (SENOKOT-S) 8.6-50 MG tablet Take 2 tablets by mouth 2 (two) times daily as needed for mild constipation or moderate constipation. 10/14/20   Enzo Bi, MD  sulfamethoxazole-trimethoprim (BACTRIM DS) 800-160 MG tablet Take 1 tablet by mouth every Monday, Wednesday, and Friday. 08/06/20   [provider]  tacrolimus (PROGRAF) 1 MG capsule Take 7 mg by mouth 2 (two) times daily.    [provider]  traZODone (DESYREL) 150 MG tablet Take 150 mg by mouth at bedtime.    [provider]  valGANciclovir (VALCYTE) 450 MG tablet Take 450 mg by mouth 2 (two) times a week. (Monday and Thursday) with largest meal of the day.    [provider]    Physical Exam: Vitals:   11/26/20 0030 11/26/20 0130 11/26/20 0230  11/26/20 0300  BP: (!) 135/93 131/78 134/75 122/70  Pulse: 69 66 66   Resp: 12 14 20 13   Temp:      TempSrc:      SpO2: 98% 98% 98%   Weight:      Height:         Vitals:   11/26/20 0030 11/26/20 0130 11/26/20 0230 11/26/20 0300  BP: (!) 135/93 131/78 134/75 122/70  Pulse: 69 66 66   Resp: 12 14 20 13   Temp:      TempSrc:      SpO2: 98% 98% 98%   Weight:      Height:  Constitutional: Alert and oriented x 3 . Not in any apparent distress HEENT:      Head: Normocephalic and atraumatic.         Eyes: PERLA, EOMI, Conjunctivae are normal. Sclera is non-icteric.       Mouth/Throat: Mucous membranes are moist.       Neck: Supple with no signs of meningismus. Cardiovascular: Regular rate and rhythm. No murmurs, gallops, or rubs. 2+ symmetrical distal pulses are present . No JVD. No LE edema Respiratory: Respiratory effort normal .Lungs sounds clear bilaterally. No wheezes, crackles, or rhonchi.  Gastrointestinal: Soft, non tender, and non distended with positive bowel sounds.  Genitourinary: No CVA tenderness. Musculoskeletal: Nontender with normal range of motion in all extremities. No cyanosis, or erythema of extremities. Neurologic:  Face is symmetric. Moving all extremities. No gross focal neurologic deficits . Skin: Skin is warm, dry.  No rash or ulcers Psychiatric: Mood and affect are normal    Labs on Admission: I have personally reviewed following labs and imaging studies  CBC: Recent Labs  Lab 11/25/20 1746  WBC 6.2  HGB 9.0*  HCT 26.4*  MCV 100.8*  PLT 093   Basic Metabolic Panel: Recent Labs  Lab 11/25/20 1746  NA 135  K 3.8  CL 98  CO2 27  GLUCOSE 195*  BUN 19  CREATININE 1.49*  CALCIUM 8.6*   GFR: Estimated Creatinine Clearance: 49.4 mL/min (A) (by C-G formula based on SCr of 1.49 mg/dL (H)). Liver Function Tests: Recent Labs  Lab 11/25/20 1746  AST 20  ALT 15  ALKPHOS 126  BILITOT 0.5  PROT 6.2*  ALBUMIN 3.6   Recent Labs   Lab 11/25/20 1746  LIPASE 20   Recent Labs  Lab 11/26/20 0110  AMMONIA 10   Coagulation Profile: No results for input(s): INR, PROTIME in the last 168 hours. Cardiac Enzymes: No results for input(s): CKTOTAL, CKMB, CKMBINDEX, TROPONINI in the last 168 hours. BNP (last 3 results) No results for input(s): PROBNP in the last 8760 hours. HbA1C: No results for input(s): HGBA1C in the last 72 hours. CBG: No results for input(s): GLUCAP in the last 168 hours. Lipid Profile: No results for input(s): CHOL, HDL, LDLCALC, TRIG, CHOLHDL, LDLDIRECT in the last 72 hours. Thyroid Function Tests: No results for input(s): TSH, T4TOTAL, FREET4, T3FREE, THYROIDAB in the last 72 hours. Anemia Panel: No results for input(s): VITAMINB12, FOLATE, FERRITIN, TIBC, IRON, RETICCTPCT in the last 72 hours. Urine analysis:    Component Value Date/Time   COLORURINE AMBER (A) 11/25/2020 1746   APPEARANCEUR CLOUDY (A) 11/25/2020 1746   LABSPEC 1.016 11/25/2020 1746   PHURINE 5.0 11/25/2020 1746   GLUCOSEU NEGATIVE 11/25/2020 1746   HGBUR NEGATIVE 11/25/2020 1746   BILIRUBINUR NEGATIVE 11/25/2020 1746   KETONESUR NEGATIVE 11/25/2020 1746   PROTEINUR NEGATIVE 11/25/2020 1746   NITRITE NEGATIVE 11/25/2020 1746   LEUKOCYTESUR NEGATIVE 11/25/2020 1746    Radiological Exams on Admission: DG Abdomen 1 View  Result Date: 11/26/2020 CLINICAL DATA:  Abdominal pain and weakness EXAM: ABDOMEN - 1 VIEW COMPARISON:  None. FINDINGS: Scattered large and small bowel gas is noted. Gastric catheter is noted in place although the jejunal limb appears coiled within the stomach. No obstructive changes are noted. No free air is seen. No acute bony abnormality is noted. IMPRESSION: Jejunal limb of the gastrojejunal catheter appears coiled within the stomach. No obstructive changes are noted Electronically Signed   By: Inez Catalina M.D.   On: 11/26/2020 01:13   CT  Head Wo Contrast  Result Date: 11/26/2020 CLINICAL DATA:   Lethargy and weakness. EXAM: CT HEAD WITHOUT CONTRAST TECHNIQUE: Contiguous axial images were obtained from the base of the skull through the vertex without intravenous contrast. COMPARISON:  October 16, 2019 FINDINGS: Brain: There is mild cerebral atrophy with widening of the extra-axial spaces and ventricular dilatation. There are areas of decreased attenuation within the white matter tracts of the supratentorial brain, consistent with microvascular disease changes. Vascular: No hyperdense vessel or unexpected calcification. Skull: Normal. Negative for fracture or focal lesion. Sinuses/Orbits: No acute finding. Other: None. IMPRESSION: 1. No acute intracranial abnormality. 2. Generalized cerebral atrophy. Electronically Signed   By: Virgina Norfolk M.D.   On: 11/26/2020 01:54   DG Chest Port 1 View  Result Date: 11/26/2020 CLINICAL DATA:  Weakness EXAM: PORTABLE CHEST 1 VIEW COMPARISON:  11/09/2020 FINDINGS: Cardiac shadow is stable. Dialysis catheter is again seen. The lungs are well aerated bilaterally. Skin fold is noted over the left chest. No acute bony abnormality is seen. Postsurgical changes are noted in the proximal left humerus. IMPRESSION: No acute abnormality noted. Electronically Signed   By: Inez Catalina M.D.   On: 11/26/2020 01:09     Assessment/Plan 65 year old male with history of HTN, CAD s/p stent, s/p liver transplant at [facility] on 02/18/2020 for NASH , ESRD, on HD TTS, GJ tube, OSA not on CPAP who presents to the ED with generalized weakness resulting in recent falls as well as malfunctioning GJ tube,    Generalized weakness   Frequent falls - Suspect general deconditioning related to comorbidities - No stigmata of infection on work-up - PT and dietary consults    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[place]) - Consult IR in the a.m. to replace GJ tube, recently done a few days prior -We will keep n.p.o. for procedure (patient eats and uses the G-tube)    Liver transplant recipient  November 2021 for NASH - Continue tacrolimus, mycophenolate    ESRD on hemodialysis [facility]) - Nephrology consult for continuation of dialysis    CAD (coronary artery disease) - No complaints of chest pain, troponin negative and EKG nonacute - Continue metoprolol, atorvastatin and aspirin    DVT prophylaxis: Lovenox  Code Status: full code  Family Communication:  none  Disposition Plan: Back to previous home environment Consults called: renal  Status:At the time of admission, it appears that the appropriate admission status for this patient is INPATIENT. This is judged to be reasonable and necessary in order to provide the required intensity of service to ensure the patient's safety given the presenting symptoms, physical exam findings, and initial radiographic and laboratory data in the context of their  Comorbid conditions.   Patient requires inpatient status due to high intensity of service, high risk for further deterioration and high frequency of surveillance required.   I certify that at the point of admission it is my clinical judgment that the patient will require inpatient hospital care spanning beyond [place] MD [facility]     11/26/2020, 3:48 AM

## 2020-11-26 NOTE — ED Notes (Signed)
No change in condition, pending hospital bed assignment.

## 2020-11-26 NOTE — Evaluation (Signed)
Physical Therapy Evaluation Patient Details Name: Alex Hampton MRN: [id number] DOB: [birth year] Today's Date: 11/26/2020   History of Present Illness  Pt is a 65 y/o M admitted on 11/26/20 with c/c of generalized weakness resulting in recent falls, sustaining compression fx on 1 occasional & also with 1 episode of vomiting. IR has been consulted to replace GJ tube. PMH: HTN, CAD s/p stent, s/p liver transplant at [facility] 02/2020 for NASH, ESRD on HD TTS, GJ tube, OSA not on CPAP, depression, MI, HLD  Clinical Impression  Pt seen for PT evaluation with pt requesting to use restroom. Pt is able to complete sit>stand from elevated surfaces with min assist but requires MAX assist for sit>stand from low toilet even with use of grab bar. Pt is able to ambulate with RW bed<>bathroom & then out in the hallway. Pt c/o slight SOB but SPO2 100% after gait. Pt would benefit from STR upon d/c to maximize independence with functional mobility & reduce fall risk prior to return home. Pt is unsafe to return home alone at this time, but upon education, is not eager to d/c to STR. Will continue to follow acutely to address strength & increase independence with transfers & address balance.      Follow Up Recommendations SNF;Supervision/Assistance - 24 hour    Equipment Recommendations  Rolling walker with 5" wheels    Recommendations for Other Services       Precautions / Restrictions Precautions Precautions: Fall Precaution Comments: GJ tube Restrictions Weight Bearing Restrictions: No      Mobility  Bed Mobility Overal bed mobility: Needs Assistance Bed Mobility: Supine to Sit;Sit to Supine     Supine to sit: HOB elevated;Mod assist Sit to supine: Supervision;HOB elevated   General bed mobility comments: use of bed rails, assistance to upright trunk for supine>sit (pt with weak core/trunk muscles)    Transfers Overall transfer level: Needs assistance Equipment used: Rolling walker (2  wheeled) Transfers: Sit to/from Stand Sit to Stand: Min assist;From elevated surface;Max assist         General transfer comment: min assist from elevated EOB, max assist from toilet with use of grab bar  Ambulation/Gait Ambulation/Gait assistance: Min assist Gait Distance (Feet): 20 ft + 20 ft + 70 Feet Assistive device: Rolling walker (2 wheeled) Gait Pattern/deviations: Decreased step length - right;Decreased step length - left;Decreased stride length;Trunk flexed Gait velocity: decreased   General Gait Details: cuing for upright posture  Stairs            Wheelchair Mobility    Modified Rankin (Stroke Patients Only)       Balance Overall balance assessment: Needs assistance Sitting-balance support: No upper extremity supported;Feet supported Sitting balance-Leahy Scale: Good     Standing balance support: No upper extremity supported;During functional activity Standing balance-Leahy Scale: Fair Standing balance comment: pt able to stand at sink without UE support with min assist/CGA                             Pertinent Vitals/Pain Pain Assessment: No/denies pain    Home Living Family/patient expects to be discharged to:: Private residence Living Arrangements: Alone Available Help at Discharge: Family;Available PRN/intermittently Type of Home: Apartment Home Access: Level entry     Home Layout: One level Home Equipment: Walker - 4 wheels;Bedside commode;Grab bars - tub/shower      Prior Function           Comments: Pt reports  he's ambulatory with rollator, endorses 2 falls in the past 6 months.     Hand Dominance        Extremity/Trunk Assessment   Upper Extremity Assessment Upper Extremity Assessment: Generalized weakness    Lower Extremity Assessment Lower Extremity Assessment: Generalized weakness    Cervical / Trunk Assessment Cervical / Trunk Assessment: Kyphotic (forward head, rounded shoulders)  Communication    Communication: No difficulties  Cognition Arousal/Alertness: Awake/alert Behavior During Therapy: WFL for tasks assessed/performed;Flat affect Overall Cognitive Status: Within Functional Limits for tasks assessed                                        General Comments General comments (skin integrity, edema, etc.): Pt with continent BM & performs peri hygiene without assistance. Pt c/o SOB after toileting but SPO2 100% - educated pt on pursed lip breathing.    Exercises     Assessment/Plan    PT Assessment Patient needs continued PT services  PT Problem List Decreased strength;Decreased mobility;Decreased safety awareness;Decreased activity tolerance;Decreased balance;Cardiopulmonary status limiting activity;Decreased knowledge of use of DME       PT Treatment Interventions DME instruction;Therapeutic exercise;Gait training;Balance training;Neuromuscular re-education;Modalities;Functional mobility training;Therapeutic activities;Patient/family education    PT Goals (Current goals can be found in the Care Plan section)  Acute Rehab PT Goals Patient Stated Goal: go home PT Goal Formulation: With patient Time For Goal Achievement: 12/10/20 Potential to Achieve Goals: Good    Frequency Min 2X/week   Barriers to discharge Decreased caregiver support      Co-evaluation               AM-PAC PT "6 Clicks" Mobility  Outcome Measure Help needed turning from your back to your side while in a flat bed without using bedrails?: A Little Help needed moving from lying on your back to sitting on the side of a flat bed without using bedrails?: A Lot Help needed moving to and from a bed to a chair (including a wheelchair)?: A Little Help needed standing up from a chair using your arms (e.g., wheelchair or bedside chair)?: A Lot Help needed to walk in hospital room?: A Little Help needed climbing 3-5 steps with a railing? : Total 6 Click Score: 14    End of Session  Equipment Utilized During Treatment: Gait belt Activity Tolerance: Patient tolerated treatment well Patient left: in bed;with call bell/phone within reach   PT Visit Diagnosis: Unsteadiness on feet (R26.81);Difficulty in walking, not elsewhere classified (R26.2);History of falling (Z91.81);Muscle weakness (generalized) (M62.81)    Time: 1451-1510 PT Time Calculation (min) (ACUTE ONLY): 19 min   Charges:   PT Evaluation $PT Eval Low Complexity: 1 Low PT Treatments $Therapeutic Activity: 8-22 mins        Lavone Nian, PT, DPT 11/26/20, 3:22 PM   Waunita Schooner 11/26/2020, 3:20 PM

## 2020-11-26 NOTE — ED Notes (Signed)
Informed RN bed assigned 

## 2020-11-26 NOTE — Procedures (Signed)
Interventional Radiology Procedure Note  Date of Procedure: 11/26/2020  Procedure: Replacement of dislodged GJ tube   Findings:  1. Existing GJ tube was retracted and coiled in the stomach  2. Successful replacement of GJ tube with tip positioned past the ligament of Treitz. Tube is ready for immediate use.   Complications: No immediate complications noted.   Estimated Blood Loss: minimal  Follow-up and Recommendations: 1. Routine. Flush tube intermittently and after use with water to avoid clogging.    Albin Felling, MD  Vascular & Interventional Radiology  11/26/2020 4:50 PM

## 2020-11-26 NOTE — ED Notes (Signed)
Spoke with Radiology RN and they are coming to take patient for G tube Procedure at this time.

## 2020-11-27 DIAGNOSIS — R441 Visual hallucinations: Secondary | ICD-10-CM

## 2020-11-27 DIAGNOSIS — R6881 Early satiety: Secondary | ICD-10-CM

## 2020-11-27 DIAGNOSIS — T85598D Other mechanical complication of other gastrointestinal prosthetic devices, implants and grafts, subsequent encounter: Secondary | ICD-10-CM

## 2020-11-27 DIAGNOSIS — R440 Auditory hallucinations: Secondary | ICD-10-CM

## 2020-11-27 DIAGNOSIS — Z944 Liver transplant status: Secondary | ICD-10-CM

## 2020-11-27 DIAGNOSIS — T85598A Other mechanical complication of other gastrointestinal prosthetic devices, implants and grafts, initial encounter: Secondary | ICD-10-CM

## 2020-11-27 LAB — C DIFFICILE QUICK SCREEN W PCR REFLEX
C Diff antigen: POSITIVE — AB
C Diff toxin: NEGATIVE

## 2020-11-27 LAB — CLOSTRIDIUM DIFFICILE BY PCR, REFLEXED: Toxigenic C. Difficile by PCR: NEGATIVE

## 2020-11-27 LAB — HEPATITIS B SURFACE ANTIGEN: Hepatitis B Surface Ag: NONREACTIVE

## 2020-11-27 MED ORDER — NOVASOURCE RENAL PO LIQD: 75.0000 mL/h | Freq: Every evening | ORAL | Status: DC

## 2020-11-27 MED ORDER — MIDODRINE HCL 5 MG PO TABS
10.0000 mg | ORAL_TABLET | ORAL | Status: DC
  Administered 2020-11-30: 10 mg via ORAL
  Filled 2020-11-27: qty 2

## 2020-11-27 MED ORDER — EPOETIN ALFA 10000 UNIT/ML IJ SOLN
4000.0000 [IU] | INTRAMUSCULAR | Status: DC
  Administered 2020-11-30: 4000 [IU] via INTRAVENOUS

## 2020-11-27 MED ORDER — NEPRO/CARBSTEADY PO LIQD
75.0000 mL/h | Freq: Every evening | ORAL | Status: AC
  Administered 2020-11-27: 75 mL/h

## 2020-11-27 NOTE — Progress Notes (Signed)
[facility]  ROUNDING NOTE   Subjective:   Alex Hampton is a 65 y.o. male with past medical history of hypertension, liver transplant at [facility] (2021), hypertension, and ESRD on HD. Patient presents to ED with complaints of nausea, vomiting, diarrhea and malaise. He will be admitted for Generalized weakness [R53.1] Recurrent falls [R29.6] Feeding tube dysfunction, subsequent encounter [T85.598D]  Patient is know to this practice and receives dialysis treatments at [facility] on a TTS schedule under the supervision of Dr Holley Raring.  Patient seen during dialysis   HEMODIALYSIS FLOWSHEET:  Blood Flow Rate (mL/min): 400 mL/min Arterial Pressure (mmHg): -170 mmHg Venous Pressure (mmHg): 100 mmHg Transmembrane Pressure (mmHg): 60 mmHg Ultrafiltration Rate (mL/min): 670 mL/min Dialysate Flow Rate (mL/min): 500 ml/min Conductivity: Machine : 13.6 Conductivity: Machine : 13.6  Ill appearing Alert and oriented Tolerating tube feeds, oral intake causes nausea Feels weak and dizzy with low BPs   Objective:  Vital signs in last 24 hours:  Temp:  [97.5 F (36.4 C)-98.9 F (37.2 C)] 97.6 F (36.4 C) (08/20 1316) Pulse Rate:  [49-65] 56 (08/20 1316) Resp:  [0-26] 20 (08/20 1316) BP: (83-156)/(56-83) 113/78 (08/20 1316) SpO2:  [99 %-100 %] 100 % (08/20 1316)  Weight change:  Filed Weights   11/25/20 1739  Weight: 72.6 kg    Intake/Output: I/O last 3 completed shifts: In: 550 [Other:50; IV Piggyback:500] Out: 100 [Urine:100]   Intake/Output this shift:  Total I/O In: -  Out: 1502 [Other:1502]  Physical Exam: General: NAD, laying in bed, ill appearing  Head: Normocephalic, atraumatic. Moist oral mucosal membranes  Eyes: Anicteric  Lungs:  Clear to auscultation, normal effort  Heart: Regular rate and rhythm  Abdomen:  Soft, nontender, GJ Tube  Extremities:  no peripheral edema.  Neurologic: Nonfocal, moving all four extremities  Skin: No lesions   Access: Rt Permcath    Basic Metabolic Panel: Recent Labs  Lab 11/25/20 1746  NA 135  K 3.8  CL 98  CO2 27  GLUCOSE 195*  BUN 19  CREATININE 1.49*  CALCIUM 8.6*     Liver Function Tests: Recent Labs  Lab 11/25/20 1746  AST 20  ALT 15  ALKPHOS 126  BILITOT 0.5  PROT 6.2*  ALBUMIN 3.6    Recent Labs  Lab 11/25/20 1746  LIPASE 20    Recent Labs  Lab 11/26/20 0110  AMMONIA 10     CBC: Recent Labs  Lab 11/25/20 1746  WBC 6.2  HGB 9.0*  HCT 26.4*  MCV 100.8*  PLT 162     Cardiac Enzymes: No results for input(s): CKTOTAL, CKMB, CKMBINDEX, TROPONINI in the last 168 hours.  BNP: Invalid input(s): POCBNP  CBG: No results for input(s): GLUCAP in the last 168 hours.  Microbiology: Results for orders placed or performed during the hospital encounter of 11/26/20  Resp Panel by RT-PCR (Flu A&B, Covid) Nasopharyngeal Swab     Status: None   Collection Time: 11/26/20 12:58 AM   Specimen: Nasopharyngeal Swab; Nasopharyngeal(NP) swabs in vial transport medium  Result Value Ref Range Status   SARS Coronavirus 2 by RT PCR NEGATIVE NEGATIVE Final    Comment: (NOTE) SARS-CoV-2 target nucleic acids are NOT DETECTED.  The SARS-CoV-2 RNA is generally detectable in upper respiratory specimens during the acute phase of infection. The lowest concentration of SARS-CoV-2 viral copies this assay can detect is 138 copies/mL. A negative result does not preclude SARS-Cov-2 infection and should not be used as the sole basis for  treatment or other patient management decisions. A negative result may occur with  improper specimen collection/handling, submission of specimen other than nasopharyngeal swab, presence of viral mutation(s) within the areas targeted by this assay, and inadequate number of viral copies(<138 copies/mL). A negative result must be combined with clinical observations, patient history, and epidemiological information. The expected result is  Negative.  Fact Sheet for Patients:  EntrepreneurPulse.com.au  Fact Sheet for Healthcare Providers:  IncredibleEmployment.be  This test is no t yet approved or cleared by the [place] FDA and  has been authorized for detection and/or diagnosis of SARS-CoV-2 by FDA under an Emergency Use Authorization (EUA). This EUA will remain  in effect (meaning this test can be used) for the duration of the COVID-19 declaration under Section 564(b)(1) of the Act, 21 U.S.C.section 360bbb-3(b)(1), unless the authorization is terminated  or revoked sooner.       Influenza A by PCR NEGATIVE NEGATIVE Final   Influenza B by PCR NEGATIVE NEGATIVE Final    Comment: (NOTE) The Xpert Xpress SARS-CoV-2/FLU/RSV plus assay is intended as an aid in the diagnosis of influenza from Nasopharyngeal swab specimens and should not be used as a sole basis for treatment. Nasal washings and aspirates are unacceptable for Xpert Xpress SARS-CoV-2/FLU/RSV testing.  Fact Sheet for Patients: EntrepreneurPulse.com.au  Fact Sheet for Healthcare Providers: IncredibleEmployment.be  This test is not yet approved or cleared by the [place] FDA and has been authorized for detection and/or diagnosis of SARS-CoV-2 by FDA under an Emergency Use Authorization (EUA). This EUA will remain in effect (meaning this test can be used) for the duration of the COVID-19 declaration under Section 564(b)(1) of the Act, 21 U.S.C. section 360bbb-3(b)(1), unless the authorization is terminated or revoked.  Performed at [facility], [address] [place], [place] [address]     Coagulation Studies: No results for input(s): LABPROT, INR in the last 72 hours.  Urinalysis: Recent Labs    11/25/20 1746  COLORURINE AMBER*  LABSPEC 1.016  PHURINE 5.0  GLUCOSEU NEGATIVE  HGBUR NEGATIVE  BILIRUBINUR NEGATIVE  KETONESUR NEGATIVE  PROTEINUR  NEGATIVE  NITRITE NEGATIVE  LEUKOCYTESUR NEGATIVE       Imaging: DG Abdomen 1 View  Result Date: 11/26/2020 CLINICAL DATA:  Abdominal pain and weakness EXAM: ABDOMEN - 1 VIEW COMPARISON:  None. FINDINGS: Scattered large and small bowel gas is noted. Gastric catheter is noted in place although the jejunal limb appears coiled within the stomach. No obstructive changes are noted. No free air is seen. No acute bony abnormality is noted. IMPRESSION: Jejunal limb of the gastrojejunal catheter appears coiled within the stomach. No obstructive changes are noted Electronically Signed   By: Inez Catalina M.D.   On: 11/26/2020 01:13   CT Head Wo Contrast  Result Date: 11/26/2020 CLINICAL DATA:  Lethargy and weakness. EXAM: CT HEAD WITHOUT CONTRAST TECHNIQUE: Contiguous axial images were obtained from the base of the skull through the vertex without intravenous contrast. COMPARISON:  October 16, 2019 FINDINGS: Brain: There is mild cerebral atrophy with widening of the extra-axial spaces and ventricular dilatation. There are areas of decreased attenuation within the white matter tracts of the supratentorial brain, consistent with microvascular disease changes. Vascular: No hyperdense vessel or unexpected calcification. Skull: Normal. Negative for fracture or focal lesion. Sinuses/Orbits: No acute finding. Other: None. IMPRESSION: 1. No acute intracranial abnormality. 2. Generalized cerebral atrophy. Electronically Signed   By: Virgina Norfolk M.D.   On: 11/26/2020 01:54   DG Chest Port 1  View  Result Date: 11/26/2020 CLINICAL DATA:  Weakness EXAM: PORTABLE CHEST 1 VIEW COMPARISON:  11/09/2020 FINDINGS: Cardiac shadow is stable. Dialysis catheter is again seen. The lungs are well aerated bilaterally. Skin fold is noted over the left chest. No acute bony abnormality is seen. Postsurgical changes are noted in the proximal left humerus. IMPRESSION: No acute abnormality noted. Electronically Signed   By: Inez Catalina  M.D.   On: 11/26/2020 01:09     Medications:     aspirin EC  81 mg Oral Daily   atorvastatin  40 mg Oral QHS   Chlorhexidine Gluconate Cloth  6 each Topical Q0600   clopidogrel  75 mg Oral Daily   escitalopram  20 mg Oral Daily   heparin  5,000 Units Subcutaneous Q8H   lidocaine  1 patch Transdermal Q12H   melatonin  10 mg Oral QHS   methylPREDNISolone (SOLU-MEDROL) injection  20 mg Intravenous Daily   mycophenolate  1,000 mg Oral BID   pantoprazole  40 mg Oral Daily   tacrolimus  7 mg Oral q AM   And   tacrolimus  8 mg Oral QHS   acetaminophen **OR** acetaminophen, ondansetron **OR** ondansetron (ZOFRAN) IV  Assessment/ Plan:  Mr. KAYLUM SHRUM is a 65 y.o.  male with past medical history of hypertension, liver transplant at [facility] (2021), hypertension, and ESRD on HD. Patient presents to ED with complaints of nausea, vomiting, diarrhea and malaise. He will be admitted for Generalized weakness [R53.1] Recurrent falls [R29.6] Feeding tube dysfunction, subsequent encounter [T85.598D]  [facility] [address]/TTS/Rt Permcath  End stage renal disease on dialysis Will maintain outpatient schedule, if possible Received dialysis today, UF goal 1.5L achieved Hypotension during dialysis but maintained MAP >60 Will prescribe Midodrine prior to treatments Next treatment scheduled for Tuesday  2. Anemia of chronic kidney disease Lab Results  Component Value Date   HGB 9.0 (L) 11/25/2020    Hgb below target Low dose EPO with dialysis  3. Secondary Hyperparathyroidism: Lab Results  Component Value Date   PTH 17 10/11/2020   CALCIUM 8.6 (L) 11/25/2020   PHOS 4.1 10/12/2020    Phosphorus at goal. Will continue to monitor calcium  4. Hypertension: 113/78 Home regimen includes Metoprolol and Midodrine on dialysis days Will order Midodrine with dialysis   LOS: 1 Lilas Diefendorf 8/20/20221:26 PM

## 2020-11-27 NOTE — Plan of Care (Signed)
  Problem: Clinical Measurements: Goal: Ability to maintain clinical measurements within normal limits will improve Outcome: Progressing Goal: Will remain free from infection Outcome: Progressing Goal: Diagnostic test results will improve Outcome: Progressing Goal: Respiratory complications will improve Outcome: Progressing Goal: Cardiovascular complication will be avoided Outcome: Progressing   Problem: Activity: Goal: Risk for activity intolerance will decrease Outcome: Progressing   Problem: Pain Managment: Goal: General experience of comfort will improve Outcome: Progressing   Pt is involved in and agrees with the plan of care. Alert and orientedx3. V/S stable except bradycardic 40s-50s. No complaints of pain or SOB. GJ tube in place. HD cath R subclavian in place.

## 2020-11-27 NOTE — Progress Notes (Signed)
Patient completed dialysis treatment as ordered. 1.5 L removed over 3 hours. Patient tolerated well. Patient experiences period of hypotension, patient asymptomatic. Report given to floor nurse Jeralyn Ruths RN.

## 2020-11-27 NOTE — Progress Notes (Signed)
Patient ID: Alex Hampton, male   DOB: March 19, 1956, 65 y.o.   MRN: [id number] [facility] PROGRESS NOTE  Alex Hampton DQQ:[id number] DOB: [birth year] DOA: 11/26/2020 PCP: Juluis Pitch, MD  HPI/Subjective: Patient and patient's son requested transfer over to [facility] where he has his transplant team.  Patient states that he has had a poor appetite and does not eat very much at a time and gets full easily.  Patient's been having some falls.  He also states that he has had visual and auditory hallucinations.  Recently started on tramadol and midodrine.  Feeding tube replaced yesterday.  Objective: Vitals:   11/27/20 1239 11/27/20 1316  BP:  113/78  Pulse: (!) 59 (!) 56  Resp: 10 20  Temp:  97.6 F (36.4 C)  SpO2:  100%    Intake/Output Summary (Last 24 hours) at 11/27/2020 1509 Last data filed at 11/27/2020 1216 Gross per 24 hour  Intake 50 ml  Output 1602 ml  Net -1552 ml   Filed Weights   11/25/20 1739  Weight: 72.6 kg    ROS: Review of Systems  Respiratory:  Negative for shortness of breath.   Cardiovascular:  Negative for chest pain.  Gastrointestinal:  Positive for diarrhea. Negative for abdominal pain, nausea and vomiting.  Exam: Physical Exam HENT:     Head: Normocephalic.     Mouth/Throat:     Pharynx: No oropharyngeal exudate.  Eyes:     General: Lids are normal.     Conjunctiva/sclera: Conjunctivae normal.  Cardiovascular:     Rate and Rhythm: Normal rate and regular rhythm.     Heart sounds: Normal heart sounds, S1 normal and S2 normal.  Pulmonary:     Breath sounds: Normal breath sounds. No decreased breath sounds, wheezing, rhonchi or rales.  Abdominal:     Palpations: Abdomen is soft.     Tenderness: There is no abdominal tenderness.  Musculoskeletal:     Right lower leg: No swelling.     Left lower leg: No swelling.  Skin:    General: Skin is warm.     Findings: No rash.  Neurological:     Mental Status: He is alert and oriented to person, place,  and time.     Comments: Follows all commands.      Scheduled Meds:  aspirin EC  81 mg Oral Daily   atorvastatin  40 mg Oral QHS   Chlorhexidine Gluconate Cloth  6 each Topical Q0600   clopidogrel  75 mg Oral Daily   [START ON 11/30/2020] epoetin (EPOGEN/PROCRIT) injection  4,000 Units Intravenous Q T,Th,Sa-HD   escitalopram  20 mg Oral Daily   heparin  5,000 Units Subcutaneous Q8H   lidocaine  1 patch Transdermal Q12H   melatonin  10 mg Oral QHS   methylPREDNISolone (SOLU-MEDROL) injection  20 mg Intravenous Daily   [START ON 11/30/2020] midodrine  10 mg Oral Q T,Th,Sa-HD   mycophenolate  1,000 mg Oral BID   pantoprazole  40 mg Oral Daily   tacrolimus  7 mg Oral q AM   And   tacrolimus  8 mg Oral QHS    Assessment/Plan:  Visual and auditory hallucinations.  I told him that tramadol is not a good medication with dialysis patients and he should not be taking that medications.  We will get a psychiatric consultation. Generalized falls and weakness.  Physical therapy recommends rehab versus 24/7 care. Early satiety and poor appetite.  Continue tube feedings and diet.  Continue to monitor closely.  Already on Protonix. Gastrostomy tube replaced yesterday.  Restart tube feedings at night. Liver transplant patient on tacrolimus and mycophenolate.  Patient also on Solu-Medrol. End-stage renal disease.  Had dialysis today. History of CAD on aspirin and Plavix and atorvastatin Relative hypotension on midodrine prior to dialysis.  Check orthostatic vital signs and may need to increase midodrine dose. Patient and family requested to be transferred to [facility].  I spoke with the [facility] transfer center and transplant team and currently they have no beds and will place him on the wait list.        Code Status:     Code Status Orders  (From admission, onward)           Start     Ordered   11/26/20 0404  Full code  Continuous        11/26/20 0405           Code Status History      Date Active Date Inactive Code Status Order ID Comments User Context   10/15/2020 2147 10/21/2020 2048 Full Code [id number]  Sidney Ace Arvella Merles, MD ED   10/10/2020 2343 10/14/2020 2137 Full Code [id number]  Ivor Costa, MD Inpatient   08/19/2020 1032 08/20/2020 1643 Full Code [id number]  Collier Bullock, MD ED   10/17/2019 0353 10/23/2019 1939 Full Code [id number]  Athena Masse, MD ED      Advance Directive Documentation    Flowsheet Row Most Recent Value  Type of Advance Directive Healthcare Power of Attorney  Pre-existing out of facility DNR order (yellow form or pink MOST form) --  "MOST" Form in Place? --      Family Communication: Spoke with son on the phone Disposition Plan: Status is: Inpatient  Dispo: The patient is from: Home              Anticipated d/c is to: Home versus rehab based on clinical course              Patient currently complained of visual and auditory hallucinations and falls.   Difficult to place patient.  No.  Consultants: Nephrology  Time spent: 28 minutes, case discussed with [facility] transplant team  [facility]  [facility]

## 2020-11-27 NOTE — Progress Notes (Signed)
Arrived back to room from dialysis.  Patient states wants to speak with MD regarding POC.  MD notified patient back in his room and would like update.

## 2020-11-28 DIAGNOSIS — R296 Repeated falls: Secondary | ICD-10-CM

## 2020-11-28 DIAGNOSIS — D638 Anemia in other chronic diseases classified elsewhere: Secondary | ICD-10-CM

## 2020-11-28 LAB — BASIC METABOLIC PANEL
Anion gap: 9 (ref 5–15)
BUN: 26 mg/dL — ABNORMAL HIGH (ref 8–23)
CO2: 30 mmol/L (ref 22–32)
Calcium: 8.8 mg/dL — ABNORMAL LOW (ref 8.9–10.3)
Chloride: 94 mmol/L — ABNORMAL LOW (ref 98–111)
Creatinine, Ser: 2.3 mg/dL — ABNORMAL HIGH (ref 0.61–1.24)
GFR, Estimated: 30 mL/min — ABNORMAL LOW (ref >60)
Glucose, Bld: 170 mg/dL — ABNORMAL HIGH (ref 70–99)
Potassium: 3.3 mmol/L — ABNORMAL LOW (ref 3.5–5.1)
Sodium: 133 mmol/L — ABNORMAL LOW (ref 135–145)

## 2020-11-28 LAB — URINALYSIS, ROUTINE W REFLEX MICROSCOPIC
Bacteria, UA: NONE SEEN
Bilirubin Urine: NEGATIVE
Glucose, UA: NEGATIVE mg/dL
Ketones, ur: NEGATIVE mg/dL
Leukocytes,Ua: NEGATIVE
Nitrite: NEGATIVE
Protein, ur: 100 mg/dL — AB
Specific Gravity, Urine: 1.015 (ref 1.005–1.030)
pH: 5.5 (ref 5.0–8.0)

## 2020-11-28 LAB — CBC
HCT: 24.7 % — ABNORMAL LOW (ref 39.0–52.0)
Hemoglobin: 8.3 g/dL — ABNORMAL LOW (ref 13.0–17.0)
MCH: 33.2 pg (ref 26.0–34.0)
MCHC: 33.6 g/dL (ref 30.0–36.0)
MCV: 98.8 fL (ref 80.0–100.0)
Platelets: 166 10*3/uL (ref 150–400)
RBC: 2.5 MIL/uL — ABNORMAL LOW (ref 4.22–5.81)
RDW: 14.2 % (ref 11.5–15.5)
WBC: 4.4 10*3/uL (ref 4.0–10.5)
nRBC: 0 % (ref 0.0–0.2)

## 2020-11-28 LAB — HEPATITIS B SURFACE ANTIGEN: Hepatitis B Surface Ag: NONREACTIVE

## 2020-11-28 MED ORDER — METOCLOPRAMIDE HCL 5 MG PO TABS
5.0000 mg | ORAL_TABLET | Freq: Three times a day (TID) | ORAL | Status: DC
  Administered 2020-11-28 – 2020-11-30 (×6): 5 mg via ORAL
  Filled 2020-11-28 (×6): qty 1

## 2020-11-28 MED ORDER — MYCOPHENOLATE MOFETIL 250 MG PO CAPS
500.0000 mg | ORAL_CAPSULE | Freq: Two times a day (BID) | ORAL | Status: DC
  Administered 2020-11-28 – 2020-11-30 (×5): 500 mg via ORAL
  Filled 2020-11-28 (×6): qty 2

## 2020-11-28 NOTE — Progress Notes (Signed)
Patient denies any hallucinations during shift today, patient seen by pysch. Patient started on Reglan today. No acute distress noted. Reported off to oncoming nurse.

## 2020-11-28 NOTE — Consult Note (Signed)
[facility] Consult   Reason for Consult:  Hallucinations Referring Physician:  Dr. Loletha Grayer Patient Identification: Alex Hampton MRN:  [id number] Principal Diagnosis: Generalized weakness Diagnosis:  Principal Problem:   Generalized weakness Active Problems:   Visual hallucination   OSA (obstructive sleep apnea)   Liver transplant recipient [facility])   Anemia of chronic disease   Type 2 diabetes mellitus with diabetic neuropathy, with long-term current use of insulin (HCC)   ESRD on hemodialysis ([place])   CAD (coronary artery disease)   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[place])   Frequent falls   Verbal auditory hallucination   Early satiety   Feeding tube dysfunction   Total Time spent with patient: 30 minutes   HPI:   Patient reports auditory and visual hallucinations over the past week for a seconds at a time; transient. Hears voices as if they were "the tail end of an [place] news cycle." No command or negative voices. He last had this in September 2021 secondary to hepatic encephalopathy, per his son who is at his bedside.  No hallucinations so far today.  He sleep about 5 hours per night,which has not worsened. Tramadol was discontinued yesterday. '  Endorses ongoing depression due to his health. "One complicating factor after the next."   Reports mild confusion at times, but pt is AAOx3.  Denies s/I, h/I.  Past Medical History:  Past Medical History:  Diagnosis Date   Depression    History of cardiac cath    History of heart artery stent    Hyperlipidemia    Hypertension    MI (myocardial infarction) ([place])    Renal disorder     Past Surgical History:  Procedure Laterality Date   IR [place] TUBE CHANGE  11/22/2020   IR [place] GASTRO/COLONIC TUBE PERCUT W/FLUORO  10/14/2020   LEFT HEART CATH AND CORONARY ANGIOGRAPHY N/A 08/19/2020   Procedure: LEFT HEART CATH AND CORONARY ANGIOGRAPHY;  Surgeon: Corey Skains, MD;  Location: [place] [facility];  Service:  Cardiovascular;  Laterality: N/A;   LIVER TRANSPLANT     Family History:  Family History  Problem Relation Age of Onset   Diabetes Mellitus II Mother    Pancreatic cancer Father    Family Psychiatric  History: none Social History:  Social History   Substance and Sexual Activity  Alcohol Use Yes   Comment: occasionally     Social History   Substance and Sexual Activity  Drug Use Never    Social History   Socioeconomic History   Marital status: Single    Spouse name: Not on file   Number of children: Not on file   Years of education: Not on file   Highest education level: Not on file  Occupational History   Not on file  Tobacco Use   Smoking status: Former   Smokeless tobacco: Never  Substance and Sexual Activity   Alcohol use: Yes    Comment: occasionally   Drug use: Never   Sexual activity: Not on file  Other Topics Concern   Not on file  Social History Narrative   Not on file   Social Determinants of Health   Financial Resource Strain: Not on file  Food Insecurity: Not on file  Transportation Needs: Not on file  Physical Activity: Not on file  Stress: Not on file  Social Connections: Not on file   Additional Social History:    Allergies:  No Known Allergies  Labs:  Results for orders placed or performed  during the hospital encounter of 11/26/20 (from the past 48 hour(s))  Lactic acid, plasma     Status: None   Collection Time: 11/26/20 10:18 PM  Result Value Ref Range   Lactic Acid, Venous 1.3 0.5 - 1.9 mmol/L    Comment: Performed at [facility], [address] [place], [place] [address]  Hepatitis B surface antigen     Status: None   Collection Time: 11/26/20 10:18 PM  Result Value Ref Range   Hepatitis B Surface Ag NON REACTIVE NON REACTIVE    Comment: Performed at [facility] [address], [place], [place] [address]  C Difficile Quick Screen w PCR reflex     Status: Abnormal   Collection Time: 11/27/20  5:45 PM    Specimen: STOOL  Result Value Ref Range   C Diff antigen POSITIVE (A) NEGATIVE   C Diff toxin NEGATIVE NEGATIVE   C Diff interpretation Results are indeterminate. See PCR results.     Comment: Performed at [facility], [address] [place], [place] [address]  C. Diff by PCR, Reflexed     Status: None   Collection Time: 11/27/20  5:45 PM  Result Value Ref Range   Toxigenic C. Difficile by PCR NEGATIVE NEGATIVE    Comment: Patient is colonized with non toxigenic C. difficile. May not need treatment unless significant symptoms are present. Performed at [facility], [address] [place], [place] [address]   Hepatitis B surface antigen     Status: None   Collection Time: 11/27/20  6:46 PM  Result Value Ref Range   Hepatitis B Surface Ag NON REACTIVE NON REACTIVE    Comment: Performed at [facility] [address], [place], [place] [address]  CBC     Status: Abnormal   Collection Time: 11/28/20  5:00 AM  Result Value Ref Range   WBC 4.4 4.0 - 10.5 K/uL   RBC 2.50 (L) 4.22 - 5.81 MIL/uL   Hemoglobin 8.3 (L) 13.0 - 17.0 g/dL   HCT 24.7 (L) 39.0 - 52.0 %   MCV 98.8 80.0 - 100.0 fL   MCH 33.2 26.0 - 34.0 pg   MCHC 33.6 30.0 - 36.0 g/dL   RDW 14.2 11.5 - 15.5 %   Platelets 166 150 - 400 K/uL   nRBC 0.0 0.0 - 0.2 %    Comment: Performed at [facility], [address], [place], [place] [address]  Basic metabolic panel     Status: Abnormal   Collection Time: 11/28/20  5:00 AM  Result Value Ref Range   Sodium 133 (L) 135 - 145 mmol/L   Potassium 3.3 (L) 3.5 - 5.1 mmol/L   Chloride 94 (L) 98 - 111 mmol/L   CO2 30 22 - 32 mmol/L   Glucose, Bld 170 (H) 70 - 99 mg/dL    Comment: Glucose reference range applies only to samples taken after fasting for at least 8 hours.   BUN 26 (H) 8 - 23 mg/dL   Creatinine, Ser 2.30 (H) 0.61 - 1.24 mg/dL   Calcium 8.8 (L) 8.9 - 10.3 mg/dL   GFR, Estimated 30 (L) >60 mL/min    Comment: (NOTE) Calculated using  the CKD-EPI Creatinine Equation (2021)    Anion gap 9 5 - 15    Comment: Performed at [facility], [address] [place], [place] [address]    Current Facility-Administered Medications  Medication Dose Route Frequency Provider Last Rate Last Admin  acetaminophen (TYLENOL) tablet 650 mg  650 mg Oral Q6H PRN Athena Masse, MD       Or   acetaminophen (TYLENOL) suppository 650 mg  650 mg Rectal Q6H PRN Athena Masse, MD       aspirin EC tablet 81 mg  81 mg Oral Daily Athena Masse, MD   81 mg at 11/28/20 4970   atorvastatin (LIPITOR) tablet 40 mg  40 mg Oral QHS Athena Masse, MD   40 mg at 11/27/20 2145   Chlorhexidine Gluconate Cloth 2 % PADS 6 each  6 each Topical Q0600 Colon Flattery, NP   6 each at 11/28/20 2637   clopidogrel (PLAVIX) tablet 75 mg  75 mg Oral Daily Annita Brod, MD   75 mg at 11/28/20 0826   [START ON 11/30/2020] epoetin alfa (EPOGEN) injection 4,000 Units  4,000 Units Intravenous Q T,Th,Sa-HD Colon Flattery, NP       escitalopram (LEXAPRO) tablet 20 mg  20 mg Oral Daily Judd Gaudier V, MD   20 mg at 11/28/20 0827   heparin injection 5,000 Units  5,000 Units Subcutaneous Q8H Judd Gaudier V, MD   5,000 Units at 11/28/20 1405   lidocaine (LIDODERM) 5 % 1 patch  1 patch Transdermal Q12H Annita Brod, MD   1 patch at 11/28/20 8588   melatonin tablet 10 mg  10 mg Oral QHS Athena Masse, MD   10 mg at 11/27/20 2144   methylPREDNISolone sodium succinate (SOLU-MEDROL) 40 mg/mL injection 20 mg  20 mg Intravenous Daily Annita Brod, MD   20 mg at 11/28/20 0827   metoCLOPramide (REGLAN) tablet 5 mg  5 mg Oral TID AC Wieting, Roran, MD   5 mg at 11/28/20 1214   [START ON 11/30/2020] midodrine (PROAMATINE) tablet 10 mg  10 mg Oral Q T,Th,Sa-HD Colon Flattery, NP       mycophenolate (CELLCEPT) capsule 500 mg  500 mg Oral BID Loletha Grayer, MD   500 mg at 11/28/20 1404   ondansetron (ZOFRAN) tablet 4 mg  4 mg Oral Q6H PRN Athena Masse, MD       Or   ondansetron [facility]) injection 4 mg  4 mg Intravenous Q6H PRN Athena Masse, MD       pantoprazole (PROTONIX) EC tablet 40 mg  40 mg Oral Daily Judd Gaudier V, MD   40 mg at 11/28/20 5027   tacrolimus (PROGRAF) capsule 7 mg  7 mg Oral q AM Lorna Dibble, RPH   7 mg at 11/28/20 1214   And   tacrolimus (PROGRAF) capsule 8 mg  8 mg Oral QHS Lorna Dibble, RPH   8 mg at 11/27/20 2144    Musculoskeletal: Strength & Muscle Tone: within normal limits Gait & Station: not assessed Patient leans: Right    Psychiatric Specialty Exam:  Presentation  General Appearance:  disheveled Eye Contact: good Speech: wnl  Mood and Affect  Mood: down Affect: neutral  Thought Process  Thought Processes: No data recorded Descriptions of Associations:No data recorded Orientation:No data recorded Thought Content:No data recorded History of Schizophrenia/Schizoaffective disorder:No data recorded Duration of Psychotic Symptoms:No data recorded Hallucinations:No data recorded Ideas of Reference:No data recorded Suicidal Thoughts:No data recorded Homicidal Thoughts:No data recorded  Sensorium  Memory: No data recorded Judgment: Fair Insight: [place] of Knowledge: fair Language: wnl  Psychomotor Activity  Psychomotor Activity: No data recorded   Physical Exam: Physical Exam Skin:  Findings: Erythema: fair.   ROS Blood pressure 130/72, pulse 67, temperature 98.4 F (36.9 C), resp. rate 16, height 6\' 1"  (1.854 m), weight 72.6 kg, SpO2 100 %. Body mass index is 21.11 kg/m.  Treatment Plan Summary: Medication management  Disposition: No evidence of imminent risk to self or others at present.    Dx: Medication-induced psychosis R/o Acute encephalopathy R/o MDD with psychotic features  Plan Obtain U/A Ammonia level wnl WBC wnl 11/27/2020 No antipsychotics for now. Monitor. No hallucinations today Psychiatric  will follow  CPT: [place], MD 11/28/2020 2:14 PM

## 2020-11-28 NOTE — Progress Notes (Signed)
Patient ID: Alex Hampton, male   DOB: 1955/12/04, 65 y.o.   MRN: [id number] [facility] PROGRESS NOTE  HAYWOOD MEINDERS LOV:[id number] DOB: [birth year] DOA: 11/26/2020 PCP: Juluis Pitch, MD  HPI/Subjective: Patient has poor appetite going on for a while.  I called over to transplant team to ask if the mycophenolate high-dose can cause the GI symptoms that he is having.  We decided to go down on the mycophenolate to 500 twice daily and monitor.  Patient not having any abdominal pain.  He wants to go back on the Reglan.  No further visual auditory hallucinations.  Continue to be off tramadol.  Objective: Vitals:   11/28/20 0457 11/28/20 0750  BP: 112/71 130/72  Pulse: 61 67  Resp: 20 16  Temp: 98.4 F (36.9 C) 98.4 F (36.9 C)  SpO2: 97% 100%    Intake/Output Summary (Last 24 hours) at 11/28/2020 1408 Last data filed at 11/28/2020 0800 Gross per 24 hour  Intake 462.5 ml  Output 30 ml  Net 432.5 ml   Filed Weights   11/25/20 1739  Weight: 72.6 kg    ROS: Review of Systems  Respiratory:  Negative for shortness of breath.   Cardiovascular:  Negative for chest pain.  Gastrointestinal:  Negative for abdominal pain and vomiting.  Exam: Physical Exam HENT:     Head: Normocephalic.     Mouth/Throat:     Pharynx: No oropharyngeal exudate.  Eyes:     General: Lids are normal.     Conjunctiva/sclera: Conjunctivae normal.  Cardiovascular:     Rate and Rhythm: Normal rate and regular rhythm.     Heart sounds: Normal heart sounds, S1 normal and S2 normal.  Pulmonary:     Breath sounds: Normal breath sounds. No decreased breath sounds, wheezing, rhonchi or rales.  Abdominal:     Palpations: Abdomen is soft.     Tenderness: There is no abdominal tenderness.  Musculoskeletal:     Right lower leg: No swelling.     Left lower leg: No swelling.  Skin:    General: Skin is warm.     Findings: No rash.  Neurological:     Mental Status: He is alert and oriented to person, place,  and time.      Scheduled Meds:  aspirin EC  81 mg Oral Daily   atorvastatin  40 mg Oral QHS   Chlorhexidine Gluconate Cloth  6 each Topical Q0600   clopidogrel  75 mg Oral Daily   [START ON 11/30/2020] epoetin (EPOGEN/PROCRIT) injection  4,000 Units Intravenous Q T,Th,Sa-HD   escitalopram  20 mg Oral Daily   heparin  5,000 Units Subcutaneous Q8H   lidocaine  1 patch Transdermal Q12H   melatonin  10 mg Oral QHS   methylPREDNISolone (SOLU-MEDROL) injection  20 mg Intravenous Daily   metoCLOPramide  5 mg Oral TID AC   [START ON 11/30/2020] midodrine  10 mg Oral Q T,Th,Sa-HD   mycophenolate  500 mg Oral BID   pantoprazole  40 mg Oral Daily   tacrolimus  7 mg Oral q AM   And   tacrolimus  8 mg Oral QHS    Assessment/Plan:  Visual and auditory hallucinations.  No further recurrence since being in the hospital.  Tramadol is not a good medication with dialysis patients.  Message psychiatry team yesterday for consult. Generalized weakness and falls.  Physical therapy recommends rehab versus 24/7 care.  The patient would like to go home. Early satiety and poor appetite.  Patient  on Protonix.  Continue tube feedings at night.  Likely will need to eat 7 small meals during the day.  The patient wants to go back on Reglan.  If the patient develops more visual or auditory hallucinations while on Reglan this medication will have to be discontinued again.  Patient already on Protonix.  Spoke with transplant team at [place] Dr. Devin Going and she was okay decreasing the mycophenolate down to 500 mg twice daily.  Can consider a CT angiogram to look at the arteries supplying the mesentery if does not improve. Gastrostomy tube replaced 2 days ago End-stage renal disease on dialysis Tuesday Thursday and Saturday CAD on aspirin Plavix and atorvastatin Low blood pressure on midodrine prior to dialysis.  Patient slightly orthostatic.  Can consider midodrine on a daily basis or 3 times a day if blood pressure  continues to drop. Spoke with the transfer center at [facility] currently no beds. Anemia of chronic disease        Code Status:     Code Status Orders  (From admission, onward)           Start     Ordered   11/26/20 0404  Full code  Continuous        11/26/20 0405           Code Status History     Date Active Date Inactive Code Status Order ID Comments User Context   10/15/2020 2147 10/21/2020 2048 Full Code [id number]  Sidney Ace Arvella Merles, MD ED   10/10/2020 2343 10/14/2020 2137 Full Code [id number]  Ivor Costa, MD Inpatient   08/19/2020 1032 08/20/2020 1643 Full Code [id number]  Collier Bullock, MD ED   10/17/2019 0353 10/23/2019 1939 Full Code [id number]  Athena Masse, MD ED      Advance Directive Documentation    Flowsheet Row Most Recent Value  Type of Advance Directive Healthcare Power of Attorney  Pre-existing out of facility DNR order (yellow form or pink MOST form) --  "MOST" Form in Place? --      Family Communication: Spoke with son on the phone Disposition Plan: Status is: Inpatient  Dispo: The patient is from: Home              Anticipated d/c is to: Home              Patient currently still weak.  Physical therapy recommending rehab versus home with supervision.  Patient would like to go home.  Would like him to work again with physical therapy.   Difficult to place patient.  No.  Consultants: Nephrology  Time spent: 28 minutes, case discussed with nephrology and transplant team at [facility].  [place]  [facility] [employer]

## 2020-11-28 NOTE — Progress Notes (Signed)
[facility]  ROUNDING NOTE   Subjective:   Alex Hampton is a 65 y.o.white male with end stage renal disease on hemodialysis, hypertension, liver transplant at [facility] (2021), coronary artery disease, depression, hyperlipidemia who was admitted to [facility] on 11/26/2020 for Generalized weakness [R53.1] Recurrent falls [R29.6] Feeding tube dysfunction, subsequent encounter [T85.598D]  Hemodialysis treatment yesterday. Tolerated treatment well. UF of 1.5 liters.   Continues to complain of nausea and poor appetite.   Endorsed hallucinations yesterday. Tramadol was discontinued.   Objective:  Vital signs in last 24 hours:  Temp:  [97.5 F (36.4 C)-98.4 F (36.9 C)] 98.4 F (36.9 C) (08/21 0750) Pulse Rate:  [56-67] 67 (08/21 0750) Resp:  [10-20] 16 (08/21 0750) BP: (112-155)/(70-91) 130/72 (08/21 0750) SpO2:  [97 %-100 %] 100 % (08/21 0750)  Weight change:  Filed Weights   11/25/20 1739  Weight: 72.6 kg    Intake/Output: I/O last 3 completed shifts: In: 512.5 [Other:50; NG/GT:462.5] Out: 1602 [Urine:100; Other:1502]   Intake/Output this shift:  No intake/output data recorded.  Physical Exam: General: NAD, laying in bed, ill appearing  Head: Normocephalic, atraumatic. Moist oral mucosal membranes  Eyes: Anicteric  Lungs:  Clear to auscultation, normal effort  Heart: Regular rate and rhythm  Abdomen:  Soft, nontender, +GJ Tube  Extremities:  no peripheral edema.  Neurologic: Nonfocal, moving all four extremities  Skin: No lesions  Access: Rt IJ Permcath    Basic Metabolic Panel: Recent Labs  Lab 11/25/20 1746 11/28/20 0500  NA 135 133*  K 3.8 3.3*  CL 98 94*  CO2 27 30  GLUCOSE 195* 170*  BUN 19 26*  CREATININE 1.49* 2.30*  CALCIUM 8.6* 8.8*     Liver Function Tests: Recent Labs  Lab 11/25/20 1746  AST 20  ALT 15  ALKPHOS 126  BILITOT 0.5  PROT 6.2*  ALBUMIN 3.6    Recent Labs  Lab 11/25/20 1746  LIPASE 20    Recent Labs  Lab  11/26/20 0110  AMMONIA 10     CBC: Recent Labs  Lab 11/25/20 1746 11/28/20 0500  WBC 6.2 4.4  HGB 9.0* 8.3*  HCT 26.4* 24.7*  MCV 100.8* 98.8  PLT 162 166     Cardiac Enzymes: No results for input(s): CKTOTAL, CKMB, CKMBINDEX, TROPONINI in the last 168 hours.  BNP: Invalid input(s): POCBNP  CBG: No results for input(s): GLUCAP in the last 168 hours.  Microbiology: Results for orders placed or performed during the hospital encounter of 11/26/20  Resp Panel by RT-PCR (Flu A&B, Covid) Nasopharyngeal Swab     Status: None   Collection Time: 11/26/20 12:58 AM   Specimen: Nasopharyngeal Swab; Nasopharyngeal(NP) swabs in vial transport medium  Result Value Ref Range Status   SARS Coronavirus 2 by RT PCR NEGATIVE NEGATIVE Final    Comment: (NOTE) SARS-CoV-2 target nucleic acids are NOT DETECTED.  The SARS-CoV-2 RNA is generally detectable in upper respiratory specimens during the acute phase of infection. The lowest concentration of SARS-CoV-2 viral copies this assay can detect is 138 copies/mL. A negative result does not preclude SARS-Cov-2 infection and should not be used as the sole basis for treatment or other patient management decisions. A negative result may occur with  improper specimen collection/handling, submission of specimen other than nasopharyngeal swab, presence of viral mutation(s) within the areas targeted by this assay, and inadequate number of viral copies(<138 copies/mL). A negative result must be combined with clinical observations, patient history, and epidemiological information. The expected result is  Negative.  Fact Sheet for Patients:  EntrepreneurPulse.com.au  Fact Sheet for Healthcare Providers:  IncredibleEmployment.be  This test is no t yet approved or cleared by the [place] FDA and  has been authorized for detection and/or diagnosis of SARS-CoV-2 by FDA under an Emergency Use Authorization  (EUA). This EUA will remain  in effect (meaning this test can be used) for the duration of the COVID-19 declaration under Section 564(b)(1) of the Act, 21 U.S.C.section 360bbb-3(b)(1), unless the authorization is terminated  or revoked sooner.       Influenza A by PCR NEGATIVE NEGATIVE Final   Influenza B by PCR NEGATIVE NEGATIVE Final    Comment: (NOTE) The Xpert Xpress SARS-CoV-2/FLU/RSV plus assay is intended as an aid in the diagnosis of influenza from Nasopharyngeal swab specimens and should not be used as a sole basis for treatment. Nasal washings and aspirates are unacceptable for Xpert Xpress SARS-CoV-2/FLU/RSV testing.  Fact Sheet for Patients: EntrepreneurPulse.com.au  Fact Sheet for Healthcare Providers: IncredibleEmployment.be  This test is not yet approved or cleared by the [place] FDA and has been authorized for detection and/or diagnosis of SARS-CoV-2 by FDA under an Emergency Use Authorization (EUA). This EUA will remain in effect (meaning this test can be used) for the duration of the COVID-19 declaration under Section 564(b)(1) of the Act, 21 U.S.C. section 360bbb-3(b)(1), unless the authorization is terminated or revoked.  Performed at [facility], [place], [place] [address]   C Difficile Quick Screen w PCR reflex     Status: Abnormal   Collection Time: 11/27/20  5:45 PM   Specimen: STOOL  Result Value Ref Range Status   C Diff antigen POSITIVE (A) NEGATIVE Final   C Diff toxin NEGATIVE NEGATIVE Final   C Diff interpretation Results are indeterminate. See PCR results.  Final    Comment: Performed at [facility], [address] [place], [place] [address]  C. Diff by PCR, Reflexed     Status: None   Collection Time: 11/27/20  5:45 PM  Result Value Ref Range Status   Toxigenic C. Difficile by PCR NEGATIVE NEGATIVE Final    Comment: Patient is colonized with non toxigenic C.  difficile. May not need treatment unless significant symptoms are present. Performed at [facility], [address] [place], [place] [address]     Coagulation Studies: No results for input(s): LABPROT, INR in the last 72 hours.  Urinalysis: Recent Labs    11/25/20 1746  COLORURINE AMBER*  LABSPEC 1.016  PHURINE 5.0  GLUCOSEU NEGATIVE  HGBUR NEGATIVE  BILIRUBINUR NEGATIVE  KETONESUR NEGATIVE  PROTEINUR NEGATIVE  NITRITE NEGATIVE  LEUKOCYTESUR NEGATIVE       Imaging: No results found.   Medications:     aspirin EC  81 mg Oral Daily   atorvastatin  40 mg Oral QHS   Chlorhexidine Gluconate Cloth  6 each Topical Q0600   clopidogrel  75 mg Oral Daily   [START ON 11/30/2020] epoetin (EPOGEN/PROCRIT) injection  4,000 Units Intravenous Q T,Th,Sa-HD   escitalopram  20 mg Oral Daily   heparin  5,000 Units Subcutaneous Q8H   lidocaine  1 patch Transdermal Q12H   melatonin  10 mg Oral QHS   methylPREDNISolone (SOLU-MEDROL) injection  20 mg Intravenous Daily   metoCLOPramide  5 mg Oral TID AC   [START ON 11/30/2020] midodrine  10 mg Oral Q T,Th,Sa-HD   mycophenolate  1,000 mg Oral BID   pantoprazole  40 mg Oral Daily  tacrolimus  7 mg Oral q AM   And   tacrolimus  8 mg Oral QHS   acetaminophen **OR** acetaminophen, ondansetron **OR** ondansetron (ZOFRAN) IV  Assessment/ Plan:  Mr. BRANCH PACITTI is a 65 y.o. white male with end stage renal disease on hemodialysis, hypertension, liver transplant at [facility] (2021), coronary artery disease, depression, hyperlipidemia who was admitted to [facility] on 11/26/2020 for Generalized weakness [R53.1] Recurrent falls [R29.6] Feeding tube dysfunction, subsequent encounter [T85.598D]  [facility] TTS Rt IJ Permcath 73kg  End stage renal disease on dialysis: tolerated hemodialysis treatment yesterday. Continue TTS schedule. Next treatment for Tuesday.   2. Anemia of chronic kidney disease Lab Results  Component Value  Date   HGB 8.3 (L) 11/28/2020    - Continue EPO with HD treatments  3. Hypertension: 130/72 - continue metorpolol - midodrine before dialysis treatments.   4. Secondary Hyperparathyroidism: low PTH of 80 on 8/9. Holding binders and vitamin D agents.  5. GI symptoms: early satiety, poor appetite, abdominal pain, nausea.  Patient is on mycophenolate which can cause these symptoms.    LOS: 2 Deshun Sedivy 8/21/202212:34 PM

## 2020-11-29 ENCOUNTER — Inpatient Hospital Stay: Payer: Medicare Other

## 2020-11-29 DIAGNOSIS — K862 Cyst of pancreas: Secondary | ICD-10-CM

## 2020-11-29 DIAGNOSIS — R531 Weakness: Secondary | ICD-10-CM

## 2020-11-29 LAB — CBC
HCT: 23.7 % — ABNORMAL LOW (ref 39.0–52.0)
Hemoglobin: 7.9 g/dL — ABNORMAL LOW (ref 13.0–17.0)
MCH: 32.4 pg (ref 26.0–34.0)
MCHC: 33.3 g/dL (ref 30.0–36.0)
MCV: 97.1 fL (ref 80.0–100.0)
Platelets: 149 10*3/uL — ABNORMAL LOW (ref 150–400)
RBC: 2.44 MIL/uL — ABNORMAL LOW (ref 4.22–5.81)
RDW: 14.2 % (ref 11.5–15.5)
WBC: 3.3 10*3/uL — ABNORMAL LOW (ref 4.0–10.5)
nRBC: 0 % (ref 0.0–0.2)

## 2020-11-29 LAB — COMPREHENSIVE METABOLIC PANEL
ALT: 12 U/L (ref 0–44)
AST: 16 U/L (ref 15–41)
Albumin: 3.1 g/dL — ABNORMAL LOW (ref 3.5–5.0)
Alkaline Phosphatase: 88 U/L (ref 38–126)
Anion gap: 7 (ref 5–15)
BUN: 33 mg/dL — ABNORMAL HIGH (ref 8–23)
CO2: 27 mmol/L (ref 22–32)
Calcium: 9 mg/dL (ref 8.9–10.3)
Chloride: 99 mmol/L (ref 98–111)
Creatinine, Ser: 2.72 mg/dL — ABNORMAL HIGH (ref 0.61–1.24)
GFR, Estimated: 25 mL/min — ABNORMAL LOW (ref >60)
Glucose, Bld: 105 mg/dL — ABNORMAL HIGH (ref 70–99)
Potassium: 3.2 mmol/L — ABNORMAL LOW (ref 3.5–5.1)
Sodium: 133 mmol/L — ABNORMAL LOW (ref 135–145)
Total Bilirubin: 0.6 mg/dL (ref 0.3–1.2)
Total Protein: 5.1 g/dL — ABNORMAL LOW (ref 6.5–8.1)

## 2020-11-29 MED ORDER — TRAZODONE HCL 50 MG PO TABS
50.0000 mg | ORAL_TABLET | Freq: Every evening | ORAL | Status: DC | PRN
  Administered 2020-11-30: 50 mg via ORAL
  Filled 2020-11-29: qty 1

## 2020-11-29 MED ORDER — NEPRO/CARBSTEADY PO LIQD
75.0000 mL/h | Freq: Every evening | ORAL | Status: DC
  Administered 2020-11-30: 75 mL/h

## 2020-11-29 MED ORDER — IOHEXOL 350 MG/ML SOLN
80.0000 mL | Freq: Once | INTRAVENOUS | Status: AC | PRN
  Administered 2020-11-29: 80 mL via INTRAVENOUS

## 2020-11-29 NOTE — Progress Notes (Signed)
Physical Therapy Treatment Patient Details Name: Alex Hampton MRN: [id number] DOB: 1955-06-05 Today's Date: 11/29/2020    History of Present Illness Pt is a 65 y/o M admitted on 11/26/20 with c/c of generalized weakness resulting in recent falls, sustaining compression fx on 1 occasional & also with 1 episode of vomiting. IR has been consulted to replace GJ tube. 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 Pt is progressing well with mobility as he is able to complete bed mobility with hospital bed features & sit>stand from EOB with supervision & only requires CGA to stand from toilet. Pt is able to ambulate into hallway with as little as supervision but requires CGA for turning & lateral gait in small spaces. Pt does abandon RW & furniture walks into small hospital bathroom with PT educating him not to do this at home. Pt notes his son will provide supervision with plans to hire a caregiver for 8hrs/day when son cannot be there. Due to progress with mobility, will update d/c recommendations to HHPT f/u & pt agreeable.     Follow Up Recommendations  Home health PT;Supervision for mobility/OOB     Equipment Recommendations  Rolling walker with 5" wheels;3in1 (PT)    Recommendations for Other Services       Precautions / Restrictions Precautions Precautions: Fall Precaution Comments: GJ tube Restrictions Weight Bearing Restrictions: No    Mobility  Bed Mobility Overal bed mobility: Needs Assistance Bed Mobility: Supine to Sit     Supine to sit: Min guard (use of bed rails)          Transfers Overall transfer level: Needs assistance Equipment used: Rolling walker (2 wheeled) Transfers: Sit to/from Stand Sit to Stand: Supervision;Min guard         General transfer comment: supervision from EOB x 2,  CGA from toilet with elevated BSC seat  Ambulation/Gait Ambulation/Gait assistance: Supervision;Min guard Gait Distance (Feet): 75 Feet (+ 10 ft + 10 ft) Assistive device: Rolling walker (2 wheeled) Gait Pattern/deviations: Decreased step length - left;Decreased step length - right;Decreased stride length Gait velocity: decreased   General Gait Details: cuing for upright posture & forward vs downward gaze, supervision for straight paths, CGA for turning, CGA for side stepping in room in narrow path   Stairs             Wheelchair Mobility    Modified Rankin (Stroke Patients Only)       Balance Overall balance assessment: Needs assistance Sitting-balance support: No upper extremity supported;Feet supported Sitting balance-Leahy Scale: Normal     Standing balance support: No upper extremity supported;During functional activity Standing balance-Leahy Scale: Fair Standing balance comment: pt able to stand at sink to perform hand hygiene with close supervision                            Cognition Arousal/Alertness: Awake/alert Behavior During Therapy: [facility] for tasks assessed/performed;Flat affect Overall Cognitive Status: Within Functional Limits for tasks assessed                                        Exercises      General  Comments General comments (skin integrity, edema, etc.): Pt with continent BM & performs peri hygiene without assistance.      Pertinent Vitals/Pain Pain Assessment: No/denies pain    Home Living                      Prior Function            PT Goals (current goals can now be found in the care plan section) Acute Rehab PT Goals Patient Stated Goal: go home PT Goal Formulation: With patient Time For Goal Achievement: 12/10/20 Potential to Achieve Goals: Good Progress towards PT goals: Progressing toward goals    Frequency    Min 2X/week      PT Plan Current plan remains appropriate     Co-evaluation              AM-PAC PT "6 Clicks" Mobility   Outcome Measure  Help needed turning from your back to your side while in a flat bed without using bedrails?: None Help needed moving from lying on your back to sitting on the side of a flat bed without using bedrails?: A Little Help needed moving to and from a bed to a chair (including a wheelchair)?: A Little Help needed standing up from a chair using your arms (e.g., wheelchair or bedside chair)?: A Little Help needed to walk in hospital room?: A Little Help needed climbing 3-5 steps with a railing? : A Lot 6 Click Score: 18    End of Session Equipment Utilized During Treatment: Gait belt Activity Tolerance: Patient tolerated treatment well Patient left: in chair;with chair alarm set;with call bell/phone within reach Nurse Communication: Mobility status PT Visit Diagnosis: Unsteadiness on feet (R26.81);Difficulty in walking, not elsewhere classified (R26.2);History of falling (Z91.81);Muscle weakness (generalized) (M62.81)     Time: 4287-6811 PT Time Calculation (min) (ACUTE ONLY): 23 min  Charges:  $Therapeutic Activity: 23-37 mins                     Lavone Nian, PT, DPT 11/29/20, 11:29 AM    Waunita Schooner 11/29/2020, 11:27 AM

## 2020-11-29 NOTE — Plan of Care (Signed)
  Problem: Clinical Measurements: Goal: Ability to maintain clinical measurements within normal limits will improve Outcome: Progressing   

## 2020-11-29 NOTE — Progress Notes (Signed)
[facility]  ROUNDING NOTE   Subjective:   Alex Hampton is a 65 y.o.white male with end stage renal disease on hemodialysis, hypertension, liver transplant at [facility] (2021), coronary artery disease, depression, hyperlipidemia who was admitted to [facility] on 11/26/2020 for Generalized weakness [R53.1] Recurrent falls [R29.6] Feeding tube dysfunction, subsequent encounter [T85.598D]  Patient seen resting in bed Completely alert and oriented today Continues to complain of nausea with oral intake Denies shortness of breath  Objective:  Vital signs in last 24 hours:  Temp:  [97.6 F (36.4 C)-98.7 F (37.1 C)] 97.6 F (36.4 C) (08/22 0443) Pulse Rate:  [58-64] 58 (08/22 0443) Resp:  [16] 16 (08/22 0443) BP: (118-137)/(78-91) 118/78 (08/22 0443) SpO2:  [98 %-100 %] 98 % (08/22 0443)  Weight change:  Filed Weights   11/25/20 1739  Weight: 72.6 kg    Intake/Output: I/O last 3 completed shifts: In: 637.5 [P.O.:175; NG/GT:462.5] Out: 230 [Urine:200; Drains:30]   Intake/Output this shift:  No intake/output data recorded.  Physical Exam: General: NAD, laying in bed  Head: Normocephalic, atraumatic. Moist oral mucosal membranes  Eyes: Anicteric  Lungs:  Clear to auscultation, normal effort  Heart: Regular rate and rhythm  Abdomen:  Soft, nontender, +GJ Tube  Extremities:  no peripheral edema.  Neurologic: Nonfocal, moving all four extremities  Skin: No lesions  Access: Rt IJ Permcath    Basic Metabolic Panel: Recent Labs  Lab 11/25/20 1746 11/28/20 0500 11/29/20 0512  NA 135 133* 133*  K 3.8 3.3* 3.2*  CL 98 94* 99  CO2 27 30 27   GLUCOSE 195* 170* 105*  BUN 19 26* 33*  CREATININE 1.49* 2.30* 2.72*  CALCIUM 8.6* 8.8* 9.0     Liver Function Tests: Recent Labs  Lab 11/25/20 1746 11/29/20 0512  AST 20 16  ALT 15 12  ALKPHOS 126 88  BILITOT 0.5 0.6  PROT 6.2* 5.1*  ALBUMIN 3.6 3.1*    Recent Labs  Lab 11/25/20 1746  LIPASE 20    Recent Labs   Lab 11/26/20 0110  AMMONIA 10     CBC: Recent Labs  Lab 11/25/20 1746 11/28/20 0500 11/29/20 0512  WBC 6.2 4.4 3.3*  HGB 9.0* 8.3* 7.9*  HCT 26.4* 24.7* 23.7*  MCV 100.8* 98.8 97.1  PLT 162 166 149*     Cardiac Enzymes: No results for input(s): CKTOTAL, CKMB, CKMBINDEX, TROPONINI in the last 168 hours.  BNP: Invalid input(s): POCBNP  CBG: No results for input(s): GLUCAP in the last 168 hours.  Microbiology: Results for orders placed or performed during the hospital encounter of 11/26/20  Resp Panel by RT-PCR (Flu A&B, Covid) Nasopharyngeal Swab     Status: None   Collection Time: 11/26/20 12:58 AM   Specimen: Nasopharyngeal Swab; Nasopharyngeal(NP) swabs in vial transport medium  Result Value Ref Range Status   SARS Coronavirus 2 by RT PCR NEGATIVE NEGATIVE Final    Comment: (NOTE) SARS-CoV-2 target nucleic acids are NOT DETECTED.  The SARS-CoV-2 RNA is generally detectable in upper respiratory specimens during the acute phase of infection. The lowest concentration of SARS-CoV-2 viral copies this assay can detect is 138 copies/mL. A negative result does not preclude SARS-Cov-2 infection and should not be used as the sole basis for treatment or other patient management decisions. A negative result may occur with  improper specimen collection/handling, submission of specimen other than nasopharyngeal swab, presence of viral mutation(s) within the areas targeted by this assay, and inadequate number of viral copies(<138 copies/mL). A negative  result must be combined with clinical observations, patient history, and epidemiological information. The expected result is Negative.  Fact Sheet for Patients:  EntrepreneurPulse.com.au  Fact Sheet for Healthcare Providers:  IncredibleEmployment.be  This test is no t yet approved or cleared by the [place] FDA and  has been authorized for detection and/or diagnosis of SARS-CoV-2  by FDA under an Emergency Use Authorization (EUA). This EUA will remain  in effect (meaning this test can be used) for the duration of the COVID-19 declaration under Section 564(b)(1) of the Act, 21 U.S.C.section 360bbb-3(b)(1), unless the authorization is terminated  or revoked sooner.       Influenza A by PCR NEGATIVE NEGATIVE Final   Influenza B by PCR NEGATIVE NEGATIVE Final    Comment: (NOTE) The Xpert Xpress SARS-CoV-2/FLU/RSV plus assay is intended as an aid in the diagnosis of influenza from Nasopharyngeal swab specimens and should not be used as a sole basis for treatment. Nasal washings and aspirates are unacceptable for Xpert Xpress SARS-CoV-2/FLU/RSV testing.  Fact Sheet for Patients: EntrepreneurPulse.com.au  Fact Sheet for Healthcare Providers: IncredibleEmployment.be  This test is not yet approved or cleared by the [place] FDA and has been authorized for detection and/or diagnosis of SARS-CoV-2 by FDA under an Emergency Use Authorization (EUA). This EUA will remain in effect (meaning this test can be used) for the duration of the COVID-19 declaration under Section 564(b)(1) of the Act, 21 U.S.C. section 360bbb-3(b)(1), unless the authorization is terminated or revoked.  Performed at [facility], [place], [place] [address]   C Difficile Quick Screen w PCR reflex     Status: Abnormal   Collection Time: 11/27/20  5:45 PM   Specimen: STOOL  Result Value Ref Range Status   C Diff antigen POSITIVE (A) NEGATIVE Final   C Diff toxin NEGATIVE NEGATIVE Final   C Diff interpretation Results are indeterminate. See PCR results.  Final    Comment: Performed at [facility], [address] [place], [place] [address]  C. Diff by PCR, Reflexed     Status: None   Collection Time: 11/27/20  5:45 PM  Result Value Ref Range Status   Toxigenic C. Difficile by PCR NEGATIVE NEGATIVE Final    Comment:  Patient is colonized with non toxigenic C. difficile. May not need treatment unless significant symptoms are present. Performed at [facility], [address] [place], [place] [address]     Coagulation Studies: No results for input(s): LABPROT, INR in the last 72 hours.  Urinalysis: Recent Labs    11/28/20 1645  COLORURINE YELLOW  LABSPEC 1.015  PHURINE 5.5  GLUCOSEU NEGATIVE  HGBUR TRACE*  BILIRUBINUR NEGATIVE  KETONESUR NEGATIVE  PROTEINUR 100*  NITRITE NEGATIVE  LEUKOCYTESUR NEGATIVE       Imaging: No results found.   Medications:     aspirin EC  81 mg Oral Daily   atorvastatin  40 mg Oral QHS   Chlorhexidine Gluconate Cloth  6 each Topical Q0600   clopidogrel  75 mg Oral Daily   [START ON 11/30/2020] epoetin (EPOGEN/PROCRIT) injection  4,000 Units Intravenous Q T,Th,Sa-HD   escitalopram  20 mg Oral Daily   feeding supplement (NEPRO CARB STEADY)  75 mL/hr Per Tube QPM   heparin  5,000 Units Subcutaneous Q8H   lidocaine  1 patch Transdermal Q12H   melatonin  10 mg Oral QHS   methylPREDNISolone (SOLU-MEDROL) injection  20 mg Intravenous Daily   metoCLOPramide  5 mg Oral TID  AC   [START ON 11/30/2020] midodrine  10 mg Oral Q T,Th,Sa-HD   mycophenolate  500 mg Oral BID   pantoprazole  40 mg Oral Daily   tacrolimus  7 mg Oral q AM   And   tacrolimus  8 mg Oral QHS   acetaminophen **OR** acetaminophen, ondansetron **OR** ondansetron (ZOFRAN) IV  Assessment/ Plan:  Mr. KACE HARTJE is a 65 y.o. white male with end stage renal disease on hemodialysis, hypertension, liver transplant at [facility] (2021), coronary artery disease, depression, hyperlipidemia who was admitted to [facility] on 11/26/2020 for Generalized weakness [R53.1] Recurrent falls [R29.6] Feeding tube dysfunction, subsequent encounter [T85.598D]  [facility] [address] TTS Rt IJ Permcath 73kg  End stage renal disease on dialysis: Continue TTS schedule. Next treatment for Tuesday.   2. Anemia of  chronic kidney disease Lab Results  Component Value Date   HGB 7.9 (L) 11/29/2020    - Continue low dose EPO with HD treatments  3. Hypertension: 118/78 - continue metorpolol - midodrine before dialysis treatments.   4. Secondary Hyperparathyroidism: low PTH of 80 on 8/9. Holding binders and vitamin D agents.  5. GI symptoms: early satiety, poor appetite, abdominal pain, nausea.  Patient is on mycophenolate which can cause these symptoms.    LOS: 3 Alycea Segoviano 8/22/20222:03 PM

## 2020-11-29 NOTE — Consult Note (Signed)
[facility]iat[facility] Consult   Reason for Consult: Follow-up for this 65 year old man with multiple medical problems seen yesterday by psychiatry consultant for hallucinations Referring Physician:  [place] Patient Identification: Alex Hampton MRN:  [id number] Principal Diagnosis: Generalized weakness Diagnosis:  Principal Problem:   Generalized weakness Active Problems:   Visual hallucination   OSA (obstructive sleep apnea)   Liver transplant recipient ([place])   Anemia of chronic disease   Type 2 diabetes mellitus with diabetic neuropathy, with long-term current use of insulin ([place])   ESRD on hemodialysis ([place])   CAD (coronary artery disease)   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[place])   Recurrent falls   Verbal auditory hallucination   Early satiety   Feeding tube dysfunction   Pancreatic cyst   Total Time spent with patient: 20 minutes  Subjective:   Alex Hampton is a 65 y.o. male patient admitted with "I am doing okay".  HPI: Patient seen chart reviewed.  Patient was seen by psychiatric consultant over the weekend because of complaints of auditory hallucinations that sounded like brief clips of speech.  Patient states that these have now gone away entirely.  Has not noticed any in the past day.  Still feels fatigued all the time.  Still has difficulty sleeping at night.  Mood is chronically dysphoric but affect is reactive and appropriate.  Good insight.  Paying attention and keeping up with his medical care.  Past Psychiatric History: History of chronic fatigue and depression related to multiple medical problems  Risk to Self:   Risk to Others:   Prior Inpatient Therapy:   Prior Outpatient Therapy:    Past Medical History:  Past Medical History:  Diagnosis Date   Depression    History of cardiac cath    History of heart artery stent    Hyperlipidemia    Hypertension    MI (myocardial infarction) ([place])    Renal disorder     Past Surgical History:  Procedure  Laterality Date   IR [place] TUBE CHANGE  11/22/2020   IR [place] GASTRO/COLONIC TUBE PERCUT W/FLUORO  10/14/2020   LEFT HEART CATH AND CORONARY ANGIOGRAPHY N/A 08/19/2020   Procedure: LEFT HEART CATH AND CORONARY ANGIOGRAPHY;  Surgeon: Corey Skains, MD;  Location: [facility];  Service: Cardiovascular;  Laterality: N/A;   LIVER TRANSPLANT     Family History:  Family History  Problem Relation Age of Onset   Diabetes Mellitus II Mother    Pancreatic cancer Father    Family Psychiatric  History: See previous Social History:  Social History   Substance and Sexual Activity  Alcohol Use Yes   Comment: occasionally     Social History   Substance and Sexual Activity  Drug Use Never    Social History   Socioeconomic History   Marital status: Single    Spouse name: Not on file   Number of children: Not on file   Years of education: Not on file   Highest education level: Not on file  Occupational History   Not on file  Tobacco Use   Smoking status: Former   Smokeless tobacco: Never  Substance and Sexual Activity   Alcohol use: Yes    Comment: occasionally   Drug use: Never   Sexual activity: Not on file  Other Topics Concern   Not on file  Social History Narrative   Not on file   Social Determinants of Health   Financial Resource Strain: Not on file  Food Insecurity: Not  on file  Transportation Needs: Not on file  Physical Activity: Not on file  Stress: Not on file  Social Connections: Not on file   Additional Social History:    Allergies:  No Known Allergies  Labs:  Results for orders placed or performed during the hospital encounter of 11/26/20 (from the past 48 hour(s))  Hepatitis B surface antigen     Status: None   Collection Time: 11/27/20  6:46 PM  Result Value Ref Range   Hepatitis B Surface Ag NON REACTIVE NON REACTIVE    Comment: Performed at [facility], [address] [place], [place] [address]  CBC     Status: Abnormal   Collection  Time: 11/28/20  5:00 AM  Result Value Ref Range   WBC 4.4 4.0 - 10.5 K/uL   RBC 2.50 (L) 4.22 - 5.81 MIL/uL   Hemoglobin 8.3 (L) 13.0 - 17.0 g/dL   HCT 24.7 (L) 39.0 - 52.0 %   MCV 98.8 80.0 - 100.0 fL   MCH 33.2 26.0 - 34.0 pg   MCHC 33.6 30.0 - 36.0 g/dL   RDW 14.2 11.5 - 15.5 %   Platelets 166 150 - 400 K/uL   nRBC 0.0 0.0 - 0.2 %    Comment: Performed at [facility], [address] [place], [place] [address]  Basic metabolic panel     Status: Abnormal   Collection Time: 11/28/20  5:00 AM  Result Value Ref Range   Sodium 133 (L) 135 - 145 mmol/L   Potassium 3.3 (L) 3.5 - 5.1 mmol/L   Chloride 94 (L) 98 - 111 mmol/L   CO2 30 22 - 32 mmol/L   Glucose, Bld 170 (H) 70 - 99 mg/dL    Comment: Glucose reference range applies only to samples taken after fasting for at least 8 hours.   BUN 26 (H) 8 - 23 mg/dL   Creatinine, Ser 2.30 (H) 0.61 - 1.24 mg/dL   Calcium 8.8 (L) 8.9 - 10.3 mg/dL   GFR, Estimated 30 (L) >60 mL/min    Comment: (NOTE) Calculated using the CKD-EPI Creatinine Equation (2021)    Anion gap 9 5 - 15    Comment: Performed at [facility], [address] [place], [place] [address]  Urinalysis, Routine w reflex microscopic Urine, Clean Catch     Status: Abnormal   Collection Time: 11/28/20  4:45 PM  Result Value Ref Range   Color, Urine YELLOW YELLOW   APPearance CLEAR CLEAR   Specific Gravity, Urine 1.015 1.005 - 1.030   pH 5.5 5.0 - 8.0   Glucose, UA NEGATIVE NEGATIVE mg/dL   Hgb urine dipstick TRACE (A) NEGATIVE   Bilirubin Urine NEGATIVE NEGATIVE   Ketones, ur NEGATIVE NEGATIVE mg/dL   Protein, ur 100 (A) NEGATIVE mg/dL   Nitrite NEGATIVE NEGATIVE   Leukocytes,Ua NEGATIVE NEGATIVE   RBC / HPF 0-5 0 - 5 RBC/hpf   WBC, UA 0-5 0 - 5 WBC/hpf   Bacteria, UA NONE SEEN NONE SEEN   Squamous Epithelial / LPF 0-5 0 - 5   Mucus PRESENT    Hyaline Casts, UA PRESENT     Comment: Performed at [facility], [address]  [place], [place] [address]  Comprehensive metabolic panel     Status: Abnormal   Collection Time: 11/29/20  5:12 AM  Result Value Ref Range   Sodium 133 (L) 135 - 145 mmol/L   Potassium 3.2 (L) 3.5 - 5.1 mmol/L  Chloride 99 98 - 111 mmol/L   CO2 27 22 - 32 mmol/L   Glucose, Bld 105 (H) 70 - 99 mg/dL    Comment: Glucose reference range applies only to samples taken after fasting for at least 8 hours.   BUN 33 (H) 8 - 23 mg/dL   Creatinine, Ser 2.72 (H) 0.61 - 1.24 mg/dL   Calcium 9.0 8.9 - 10.3 mg/dL   Total Protein 5.1 (L) 6.5 - 8.1 g/dL   Albumin 3.1 (L) 3.5 - 5.0 g/dL   AST 16 15 - 41 U/L   ALT 12 0 - 44 U/L   Alkaline Phosphatase 88 38 - 126 U/L   Total Bilirubin 0.6 0.3 - 1.2 mg/dL   GFR, Estimated 25 (L) >60 mL/min    Comment: (NOTE) Calculated using the CKD-EPI Creatinine Equation (2021)    Anion gap 7 5 - 15    Comment: Performed at [facility], [address] [place], [place] [address]  CBC     Status: Abnormal   Collection Time: 11/29/20  5:12 AM  Result Value Ref Range   WBC 3.3 (L) 4.0 - 10.5 K/uL   RBC 2.44 (L) 4.22 - 5.81 MIL/uL   Hemoglobin 7.9 (L) 13.0 - 17.0 g/dL   HCT 23.7 (L) 39.0 - 52.0 %   MCV 97.1 80.0 - 100.0 fL   MCH 32.4 26.0 - 34.0 pg   MCHC 33.3 30.0 - 36.0 g/dL   RDW 14.2 11.5 - 15.5 %   Platelets 149 (L) 150 - 400 K/uL   nRBC 0.0 0.0 - 0.2 %    Comment: Performed at [facility], [address], [place], [place] [address]    Current Facility-Administered Medications  Medication Dose Route Frequency Provider Last Rate Last Admin   acetaminophen (TYLENOL) tablet 650 mg  650 mg Oral Q6H PRN Athena Masse, MD       Or   acetaminophen (TYLENOL) suppository 650 mg  650 mg Rectal Q6H PRN Athena Masse, MD       aspirin EC tablet 81 mg  81 mg Oral Daily Judd Gaudier V, MD   81 mg at 11/29/20 2376   atorvastatin (LIPITOR) tablet 40 mg  40 mg Oral QHS Athena Masse, MD   40 mg at 11/28/20 2314   Chlorhexidine Gluconate Cloth 2  % PADS 6 each  6 each Topical Q0600 Colon Flattery, NP   6 each at 11/29/20 2831   clopidogrel (PLAVIX) tablet 75 mg  75 mg Oral Daily Annita Brod, MD   75 mg at 11/29/20 0922   [START ON 11/30/2020] epoetin alfa (EPOGEN) injection 4,000 Units  4,000 Units Intravenous Q T,Th,Sa-HD Breeze, Benancio Deeds, NP       escitalopram (LEXAPRO) tablet 20 mg  20 mg Oral Daily Judd Gaudier V, MD   20 mg at 11/29/20 0920   feeding supplement (NEPRO CARB STEADY) liquid  75 mL/hr Per Tube QPM Wieting, [place], MD       heparin injection 5,000 Units  5,000 Units Subcutaneous Q8H Judd Gaudier V, MD   5,000 Units at 11/29/20 1304   lidocaine (LIDODERM) 5 % 1 patch  1 patch Transdermal Q12H Annita Brod, MD   1 patch at 11/29/20 5176   melatonin tablet 10 mg  10 mg Oral QHS Judd Gaudier V, MD   10 mg at 11/28/20 2313   methylPREDNISolone sodium succinate (SOLU-MEDROL) 40 mg/mL injection 20 mg  20 mg Intravenous Daily Gevena Barre  K, MD   20 mg at 11/29/20 7829   metoCLOPramide (REGLAN) tablet 5 mg  5 mg Oral TID AC Wieting, Encarnacion, MD   5 mg at 11/29/20 1728   [START ON 11/30/2020] midodrine (PROAMATINE) tablet 10 mg  10 mg Oral Q T,Th,Sa-HD Colon Flattery, NP       mycophenolate (CELLCEPT) capsule 500 mg  500 mg Oral BID Loletha Grayer, MD   500 mg at 11/29/20 0921   ondansetron (ZOFRAN) tablet 4 mg  4 mg Oral Q6H PRN Athena Masse, MD       Or   ondansetron [facility]) injection 4 mg  4 mg Intravenous Q6H PRN Athena Masse, MD       pantoprazole (PROTONIX) EC tablet 40 mg  40 mg Oral Daily Judd Gaudier V, MD   40 mg at 11/29/20 5621   tacrolimus (PROGRAF) capsule 7 mg  7 mg Oral q AM Lorna Dibble, RPH   7 mg at 11/29/20 3086   And   tacrolimus (PROGRAF) capsule 8 mg  8 mg Oral QHS Lorna Dibble, RPH   8 mg at 11/28/20 2315    Musculoskeletal: Strength & Muscle Tone: decreased Gait & Station: unsteady Patient leans: N/A            Psychiatric Specialty  Exam:  Presentation  General Appearance:  No data recorded Eye Contact: No data recorded Speech: No data recorded Speech Volume: No data recorded Handedness: No data recorded  Mood and Affect  Mood: No data recorded Affect: No data recorded  Thought Process  Thought Processes: No data recorded Descriptions of Associations:No data recorded Orientation:No data recorded Thought Content:No data recorded History of Schizophrenia/Schizoaffective disorder:No data recorded Duration of Psychotic Symptoms:No data recorded Hallucinations:No data recorded Ideas of Reference:No data recorded Suicidal Thoughts:No data recorded Homicidal Thoughts:No data recorded  Sensorium  Memory: No data recorded Judgment: No data recorded Insight: No data recorded  Executive Functions  Concentration: No data recorded Attention Span: No data recorded Recall: No data recorded Fund of Knowledge: No data recorded Language: No data recorded  Psychomotor Activity  Psychomotor Activity: No data recorded  Assets  Assets: No data recorded  Sleep  Sleep: No data recorded  Physical Exam: Physical Exam Vitals and nursing note reviewed.  Constitutional:      Appearance: Normal appearance.  HENT:     Head: Normocephalic and atraumatic.     Mouth/Throat:     Pharynx: Oropharynx is clear.  Eyes:     Pupils: Pupils are equal, round, and reactive to light.  Cardiovascular:     Rate and Rhythm: Normal rate and regular rhythm.  Pulmonary:     Effort: Pulmonary effort is normal.     Breath sounds: Normal breath sounds.  Abdominal:     General: Abdomen is flat.     Palpations: Abdomen is soft.  Musculoskeletal:        General: Normal range of motion.  Skin:    General: Skin is warm and dry.  Neurological:     General: No focal deficit present.     Mental Status: He is alert. Mental status is at baseline.  Psychiatric:        Attention and Perception: Attention normal.         Mood and Affect: Mood normal.        Speech: Speech normal.        Behavior: Behavior is cooperative.        Thought Content: Thought content  normal.        Cognition and Memory: Cognition normal.   Review of Systems  Constitutional:  Positive for malaise/fatigue.  HENT: Negative.    Eyes: Negative.   Respiratory: Negative.    Cardiovascular: Negative.   Gastrointestinal: Negative.   Musculoskeletal: Negative.   Skin: Negative.   Neurological: Negative.   Psychiatric/Behavioral: Negative.    Blood pressure (!) 149/81, pulse 76, temperature 97.6 F (36.4 C), temperature source Oral, resp. rate 16, height 6\' 1"  (1.854 m), weight 72.6 kg, SpO2 99 %. Body mass index is 21.11 kg/m.  Treatment Plan Summary: Plan patient had not been started on any antipsychotic medicine and this seems like the right decision.  No need to change any medication at this point.  Supportive counseling completed.  No further psychiatric follow-up needed at this point.  Disposition: Patient does not meet criteria for psychiatric inpatient admission.  Alethia Berthold, MD 11/29/2020 5:45 PM

## 2020-11-29 NOTE — Progress Notes (Signed)
Patient ID: Alex Hampton, male   DOB: 09-Nov-1955, 65 y.o.   MRN: [id number] [facility] PROGRESS NOTE  Alex Hampton JJK:[id number] DOB: 10/05/1955 DOA: 11/26/2020 PCP: Juluis Pitch, MD  HPI/Subjective: Patient still not feeling great.  He wanted to go ahead with a CT scan of your abdomen to try to figure out what is going on with him and his appetite.  Occasionally has abdominal pain when he eats.  Patient came in with visual and auditory hallucinations and gastrostomy tube malfunction and weakness.  Objective: Vitals:   11/29/20 0443 11/29/20 1519  BP: 118/78 (!) 149/81  Pulse: (!) 58 76  Resp: 16 16  Temp: 97.6 F (36.4 C) 97.6 F (36.4 C)  SpO2: 98% 99%     Filed Weights   11/25/20 1739  Weight: 72.6 kg    ROS: Review of Systems  Respiratory:  Negative for cough and shortness of breath.   Cardiovascular:  Negative for chest pain.  Gastrointestinal:  Positive for abdominal pain. Negative for nausea and vomiting.  Exam: Physical Exam HENT:     Head: Normocephalic.     Mouth/Throat:     Pharynx: No oropharyngeal exudate.  Eyes:     General: Lids are normal.     Conjunctiva/sclera: Conjunctivae normal.     Pupils: Pupils are equal, round, and reactive to light.  Cardiovascular:     Rate and Rhythm: Normal rate and regular rhythm.     Heart sounds: Normal heart sounds, S1 normal and S2 normal.  Pulmonary:     Breath sounds: No decreased breath sounds, wheezing, rhonchi or rales.  Abdominal:     Palpations: Abdomen is soft.     Tenderness: There is no abdominal tenderness.  Musculoskeletal:     Right lower leg: No swelling.     Left lower leg: No swelling.  Skin:    General: Skin is warm.     Findings: No rash.  Neurological:     Mental Status: He is alert and oriented to person, place, and time.      Scheduled Meds:  aspirin EC  81 mg Oral Daily   atorvastatin  40 mg Oral QHS   Chlorhexidine Gluconate Cloth  6 each Topical Q0600   clopidogrel  75  mg Oral Daily   [START ON 11/30/2020] epoetin (EPOGEN/PROCRIT) injection  4,000 Units Intravenous Q T,Th,Sa-HD   escitalopram  20 mg Oral Daily   feeding supplement (NEPRO CARB STEADY)  75 mL/hr Per Tube QPM   heparin  5,000 Units Subcutaneous Q8H   lidocaine  1 patch Transdermal Q12H   melatonin  10 mg Oral QHS   methylPREDNISolone (SOLU-MEDROL) injection  20 mg Intravenous Daily   metoCLOPramide  5 mg Oral TID AC   [START ON 11/30/2020] midodrine  10 mg Oral Q T,Th,Sa-HD   mycophenolate  500 mg Oral BID   pantoprazole  40 mg Oral Daily   tacrolimus  7 mg Oral q AM   And   tacrolimus  8 mg Oral QHS     Assessment/Plan:  Visual and auditory hallucinations.  No further recurrence.  Likely medication related.  Appreciate psychiatric consultation. Generalized weakness and falls.  Physical therapy now recommending home with home health.  Patient did not want to go to rehab. Early satiety and poor appetite.  Patient on Protonix.  Tube feedings at night.  Likely will have to eat small meals during the day.  Continue Reglan.  In speaking with transplant team okay to go down  on the mycophenolate dose to 500 mg twice a day.  CT angiogram does not show any signs of blockages in the mesenteric arteries.  The patient does have a 2.7 cm fluid collection adjacent to the pancreatic tail.  Not sure if this is causing any issues with his early satiety.  Can follow-up at tertiary care center for possibility of endoscopic ultrasound for further evaluation.  Fluid collections were seen on prior CAT scans. Liver transplant patient continue immunosuppressive's Gastrostomy tube replaced during this hospital course End-stage renal disease on dialysis Tuesday Thursday and Saturday will need dialysis tomorrow after CT scan today. Low blood pressure.  On midodrine prior to dialysis. Anemia of chronic disease Subacute L5 compression fracture, prior L2 compression fracture     Code Status:     Code Status Orders   (From admission, onward)           Start     Ordered   11/26/20 0404  Full code  Continuous        11/26/20 0405           Code Status History     Date Active Date Inactive Code Status Order ID Comments User Context   10/15/2020 2147 10/21/2020 2048 Full Code [id number]  Sidney Ace Arvella Merles, MD ED   10/10/2020 2343 10/14/2020 2137 Full Code [id number]  Ivor Costa, MD Inpatient   08/19/2020 1032 08/20/2020 1643 Full Code [id number]  Collier Bullock, MD ED   10/17/2019 0353 10/23/2019 1939 Full Code [id number]  Athena Masse, MD ED      Advance Directive Documentation    Flowsheet Row Most Recent Value  Type of Advance Directive Healthcare Power of Attorney  Pre-existing out of facility DNR order (yellow form or pink MOST form) --  "MOST" Form in Place? --      Family Communication: Spoke for son on the phone Disposition Plan: Status is: Inpatient  Dispo: The patient is from: Home              Anticipated d/c is to: Home with home health              Patient currently received CT angio here today will have dialysis tomorrow prior to disposition.   Difficult to place patient.  No.  Consultants: Nephrology  Time spent: 28 minutes  [place]

## 2020-11-29 NOTE — Care Management Important Message (Signed)
Important Message  Patient Details  Name: Alex Hampton MRN: [id number] Date of Birth: [birth year]   Medicare Important Message Given:  Yes     Dannette Barbara 11/29/2020, 12:56 PM

## 2020-11-29 NOTE — TOC Initial Note (Signed)
Transition of Care [facility]) - Initial/Assessment Note    Patient Details  Name: Alex Hampton MRN: [id number] Date of Birth: 1955/11/28  Transition of Care [facility]) CM/SW Contact:    Beverly Sessions, RN Phone Number: 11/29/2020, 5:20 PM  Clinical Narrative:                  Admitted for: Weakness Admitted from:Home [id number] - son transports  Patient states his caregivers transport to HD  Patient states that he has caregivers 7 days a week, 8 hours a day [facility] Current home health/prior home health/DME: Tube feeds, rollator, [facility]SC   PT saw patient today.  Recommendations upgraded from SNF to home health. Patient agreeable for home health services states he does not have a preference of agency.  Referral made to cory with [facility].   Expected Discharge Plan: [employer] Barriers to Discharge: Continued Medical Work up   Patient Goals and CMS Choice        Expected Discharge Plan and Services Expected Discharge Plan: [place]   Discharge Planning Services: CM Consult   Living arrangements for the past 2 months: Single Family Home                           HH Arranged: PT, OT, RN [facility] Agency: [place] Date [facility] Agency Contacted: 11/29/20   Representative spoke with at [place]: [name]  Prior Living Arrangements/Services Living arrangements for the past 2 months: [place] Lives with:: Self Patient language and need for interpreter reviewed:: Yes Do you feel safe going back to the place where you live?: Yes      Need for Family Participation in Patient Care: Yes (Comment) Care giver support system in place?: Yes (comment) Current home services: DME Criminal Activity/Legal Involvement Pertinent to Current Situation/Hospitalization: No - Comment as needed  Activities of Daily Living Home Assistive Devices/Equipment: Feeding equipment ADL Screening (condition at time  of admission) Patient's cognitive ability adequate to safely complete daily activities?: Yes Is the patient deaf or have difficulty hearing?: No Does the patient have difficulty seeing, even when wearing glasses/contacts?: No Does the patient have difficulty concentrating, remembering, or making decisions?: No Patient able to express need for assistance with ADLs?: Yes Does the patient have difficulty dressing or bathing?: No Independently performs ADLs?: Yes (appropriate for developmental age) Does the patient have difficulty walking or climbing stairs?: Yes Weakness of Legs: Both Weakness of Arms/Hands: None  Permission Sought/Granted                  Emotional Assessment Appearance:: Appears older than stated age     Orientation: : Oriented to Self, Oriented to Place, Oriented to  Time, Oriented to Situation Alcohol / Substance Use: Not Applicable Psych Involvement: No (comment)  Admission diagnosis:  Generalized weakness [R53.1] Recurrent falls [R29.6] Feeding tube dysfunction, subsequent encounter [T85.598D] Patient Active Problem List   Diagnosis Date Noted   Pancreatic cyst    Verbal auditory hallucination    Early satiety    Feeding tube dysfunction    Generalized weakness 11/26/2020   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[place]) 11/26/2020   Recurrent falls 11/26/2020   Syncope 10/15/2020   Uremia 10/10/2020   ESRD on hemodialysis ([place]) 10/10/2020   Hyperkalemia 10/10/2020   Elevated troponin 10/10/2020   GERD (gastroesophageal reflux disease) 10/10/2020   CAD (coronary artery disease) 10/10/2020  G tube feedings ([place]) 10/10/2020   Diarrhea 10/10/2020   Type II diabetes mellitus with renal manifestations ([place]) 10/10/2020   Weakness    ACS (acute coronary syndrome) [facility])    Liver transplant recipient [facility])    Anemia of chronic disease    Type 2 diabetes mellitus with diabetic neuropathy, with long-term current use of insulin ([place])    ESRD (end stage renal  disease) ([place]) 08/19/2020   History of anemia due to chronic kidney disease 08/19/2020   Gait abnormality 10/17/2019   History of fall within past 90 days 10/17/2019   Visual hallucination 10/17/2019   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 10/17/2019   HTN (hypertension) 10/17/2019   History of MI (myocardial infarction) 10/17/2019   OSA (obstructive sleep apnea) 10/17/2019   Nondisplaced comminuted supracondylar fracture without intercondylar fracture of left humerus, subsequent encounter for fracture with nonunion 10/17/2019   Anxiety and depression 10/17/2019   PCP:  Juluis Pitch, MD Pharm[facility]   [id number] [place], [place] [address] [place] [address] Phone: [phone] Fax: [phone]     Social Determinants of Health (SDOH) Interventions    Readmission Risk Interventions Readmission Risk Prevention Plan 11/29/2020 10/11/2020  Transportation Screening Complete Complete  PCP or Specialist Appt within 3-5 Days - Complete  HRI or [place] - Complete  Social Work Consult for [place] Planning/Counseling - Complete  Palliative Care Screening - Not Applicable  Medication Review Press photographer) Complete Complete  HRI or Home Care Consult Complete -  SW Recovery Care/Counseling Consult Complete -  Palliative Care Screening Complete -  [place] Not Applicable -  Some recent data might be hidden

## 2020-11-30 DIAGNOSIS — T85598S Other mechanical complication of other gastrointestinal prosthetic devices, implants and grafts, sequela: Secondary | ICD-10-CM

## 2020-11-30 LAB — CBC WITH DIFFERENTIAL/PLATELET
Abs Immature Granulocytes: 0.1 10*3/uL — ABNORMAL HIGH (ref 0.00–0.07)
Basophils Absolute: 0 10*3/uL (ref 0.0–0.1)
Basophils Relative: 0 %
Eosinophils Absolute: 0 10*3/uL (ref 0.0–0.5)
Eosinophils Relative: 0 %
HCT: 27.3 % — ABNORMAL LOW (ref 39.0–52.0)
Hemoglobin: 9.6 g/dL — ABNORMAL LOW (ref 13.0–17.0)
Immature Granulocytes: 2 %
Lymphocytes Relative: 4 %
Lymphs Abs: 0.2 10*3/uL — ABNORMAL LOW (ref 0.7–4.0)
MCH: 34.8 pg — ABNORMAL HIGH (ref 26.0–34.0)
MCHC: 35.2 g/dL (ref 30.0–36.0)
MCV: 98.9 fL (ref 80.0–100.0)
Monocytes Absolute: 0.2 10*3/uL (ref 0.1–1.0)
Monocytes Relative: 4 %
Neutro Abs: 4.9 10*3/uL (ref 1.7–7.7)
Neutrophils Relative %: 90 %
Platelets: 187 10*3/uL (ref 150–400)
RBC: 2.76 MIL/uL — ABNORMAL LOW (ref 4.22–5.81)
RDW: 14.4 % (ref 11.5–15.5)
WBC: 5.5 10*3/uL (ref 4.0–10.5)
nRBC: 0 % (ref 0.0–0.2)

## 2020-11-30 LAB — BASIC METABOLIC PANEL
Anion gap: 12 (ref 5–15)
BUN: 42 mg/dL — ABNORMAL HIGH (ref 8–23)
CO2: 26 mmol/L (ref 22–32)
Calcium: 8.9 mg/dL (ref 8.9–10.3)
Chloride: 97 mmol/L — ABNORMAL LOW (ref 98–111)
Creatinine, Ser: 3 mg/dL — ABNORMAL HIGH (ref 0.61–1.24)
GFR, Estimated: 22 mL/min — ABNORMAL LOW (ref >60)
Glucose, Bld: 208 mg/dL — ABNORMAL HIGH (ref 70–99)
Potassium: 3.3 mmol/L — ABNORMAL LOW (ref 3.5–5.1)
Sodium: 135 mmol/L (ref 135–145)

## 2020-11-30 LAB — CBC
HCT: 22.9 % — ABNORMAL LOW (ref 39.0–52.0)
Hemoglobin: 8.1 g/dL — ABNORMAL LOW (ref 13.0–17.0)
MCH: 34.9 pg — ABNORMAL HIGH (ref 26.0–34.0)
MCHC: 35.4 g/dL (ref 30.0–36.0)
MCV: 98.7 fL (ref 80.0–100.0)
Platelets: 154 10*3/uL (ref 150–400)
RBC: 2.32 MIL/uL — ABNORMAL LOW (ref 4.22–5.81)
RDW: 14.3 % (ref 11.5–15.5)
WBC: 3.5 10*3/uL — ABNORMAL LOW (ref 4.0–10.5)
nRBC: 0 % (ref 0.0–0.2)

## 2020-11-30 MED ORDER — EPOETIN ALFA 4000 UNIT/ML IJ SOLN
INTRAMUSCULAR | Status: AC
  Filled 2020-11-30: qty 1

## 2020-11-30 MED ORDER — MIDODRINE HCL 10 MG PO TABS: 10.0000 mg | ORAL_TABLET | Freq: Every day | ORAL | 0 refills | Status: DC

## 2020-11-30 MED ORDER — MYCOPHENOLATE MOFETIL 250 MG PO CAPS: 500.0000 mg | ORAL_CAPSULE | Freq: Two times a day (BID) | ORAL | Status: AC

## 2020-11-30 NOTE — Progress Notes (Signed)
Pt with bleeding from RLQ puncture site on skin from possibly heparin SQ. Pt with slow and steady ooze noted, pressure applied for 10 minutes with continued slow bleeding, surgicell dressing applied with stoppage of bleeding at 2336. Dr. Juleen China notified, secure chat message also sent to dr. Leslye Peer, hospitalist to notify of same, will recheck cbc when pt completed with dialysis per dr. Juleen China. Small hematoma noted to site as well. Pt did have congealed dried blood under back when rolled to check for additional bleeding that had saturated the pad beneath his back.

## 2020-11-30 NOTE — Progress Notes (Signed)
No additional bleeding noted from RLQ.

## 2020-11-30 NOTE — TOC Transition Note (Signed)
Transition of Care [facility]) - CM/SW Discharge Note   Patient Details  Name: Alex Hampton MRN: [id number] Date of Birth: 03-23-1956  Transition of Care [facility]) CM/SW Contact:  Beverly Sessions, RN Phone Number: 11/30/2020, 3:08 PM   Clinical Narrative:     Patient to discharge today [name] with [facility] notified Dialysis liaison notified of discharge      Barriers to Discharge: Continued Medical Work up   Patient Goals and CMS Choice        Discharge Placement                       Discharge Plan and Services   Discharge Planning Services: CM Consult                      HH Arranged: PT, OT, RN [place] [facility] Agency: [place] Date [place]: 11/29/20   Representative spoke with at [place]: [place] ([place]) Interventions     Readmission Risk Interventions Readmission Risk Prevention Plan 11/29/2020 10/11/2020  Transportation Screening Complete Complete  PCP or Specialist Appt within 3-5 Days - Complete  HRI or [place] - Complete  Social Work Consult for [place] Planning/Counseling - Complete  Palliative Care Screening - Not Applicable  Medication Review Press photographer) Complete Complete  HRI or Home Care Consult Complete -  SW Recovery Care/Counseling Consult Complete -  Palliative Care Screening Complete -  [place] Not Applicable -  Some recent data might be hidden

## 2020-11-30 NOTE — Progress Notes (Signed)
Pt with hypotension at end of treatment, UF turned off early, pt alert and oriented x4 with pwd skin. No dizziness or shob noted. See flowsheets for vital signs and net UF removed.

## 2020-11-30 NOTE — Progress Notes (Signed)
Pt is alert and oriented with no complaints , last BP was 94/66(73) RN and doctor aware.

## 2020-11-30 NOTE — Progress Notes (Signed)
[facility]  ROUNDING NOTE   Subjective:   Alex Hampton is a 65 y.o.white male with end stage renal disease on hemodialysis, hypertension, liver transplant at [facility] (2021), coronary artery disease, depression, hyperlipidemia who was admitted to [facility] on 11/26/2020 for Generalized weakness [R53.1] Recurrent falls [R29.6] Feeding tube dysfunction, subsequent encounter [T85.598D]  Patient seen during dialysis   HEMODIALYSIS FLOWSHEET:  Blood Flow Rate (mL/min): 200 mL/min Arterial Pressure (mmHg): -80 mmHg Venous Pressure (mmHg): 70 mmHg Transmembrane Pressure (mmHg): 30 mmHg Ultrafiltration Rate (mL/min): 70 mL/min Dialysate Flow Rate (mL/min): 500 ml/min Conductivity: Machine : 13.8 Conductivity: Machine : 13.8 Dialysis Fluid Bolus: Normal Saline Bolus Amount (mL): 250 mL  No complaints at this time   Objective:  Vital signs in last 24 hours:  Temp:  [97.6 F (36.4 C)-98.7 F (37.1 C)] 98.7 F (37.1 C) (08/23 0917) Pulse Rate:  [67-76] 70 (08/23 0917) Resp:  [13-21] 20 (08/23 1236) BP: (87-150)/(63-91) 94/66 (08/23 1230) SpO2:  [97 %-100 %] 97 % (08/23 0754)  Weight change:  Filed Weights   11/25/20 1739  Weight: 72.6 kg    Intake/Output: I/O last 3 completed shifts: In: 60 [P.O.:295] Out: 200 [Urine:200]   Intake/Output this shift:  Total I/O In: -  Out: 1440 [Urine:500; Other:940]  Physical Exam: General: NAD, laying in bed  Head: Normocephalic, atraumatic. Moist oral mucosal membranes  Eyes: Anicteric  Lungs:  Clear to auscultation, normal effort  Heart: Regular rate and rhythm  Abdomen:  Soft, nontender, +GJ Tube  Extremities:  no peripheral edema.  Neurologic: Nonfocal, moving all four extremities  Skin: No lesions  Access: Rt IJ Permcath    Basic Metabolic Panel: Recent Labs  Lab 11/25/20 1746 11/28/20 0500 11/29/20 0512 11/30/20 0805  NA 135 133* 133* 135  K 3.8 3.3* 3.2* 3.3*  CL 98 94* 99 97*  CO2 27 30 27 26   GLUCOSE  195* 170* 105* 208*  BUN 19 26* 33* 42*  CREATININE 1.49* 2.30* 2.72* 3.00*  CALCIUM 8.6* 8.8* 9.0 8.9     Liver Function Tests: Recent Labs  Lab 11/25/20 1746 11/29/20 0512  AST 20 16  ALT 15 12  ALKPHOS 126 88  BILITOT 0.5 0.6  PROT 6.2* 5.1*  ALBUMIN 3.6 3.1*    Recent Labs  Lab 11/25/20 1746  LIPASE 20    Recent Labs  Lab 11/26/20 0110  AMMONIA 10     CBC: Recent Labs  Lab 11/25/20 1746 11/28/20 0500 11/29/20 0512 11/30/20 0805 11/30/20 1312  WBC 6.2 4.4 3.3* 3.5* 5.5  NEUTROABS  --   --   --   --  4.9  HGB 9.0* 8.3* 7.9* 8.1* 9.6*  HCT 26.4* 24.7* 23.7* 22.9* 27.3*  MCV 100.8* 98.8 97.1 98.7 98.9  PLT 162 166 149* 154 187     Cardiac Enzymes: No results for input(s): CKTOTAL, CKMB, CKMBINDEX, TROPONINI in the last 168 hours.  BNP: Invalid input(s): POCBNP  CBG: No results for input(s): GLUCAP in the last 168 hours.  Microbiology: Results for orders placed or performed during the hospital encounter of 11/26/20  Resp Panel by RT-PCR (Flu A&B, Covid) Nasopharyngeal Swab     Status: None   Collection Time: 11/26/20 12:58 AM   Specimen: Nasopharyngeal Swab; Nasopharyngeal(NP) swabs in vial transport medium  Result Value Ref Range Status   SARS Coronavirus 2 by RT PCR NEGATIVE NEGATIVE Final    Comment: (NOTE) SARS-CoV-2 target nucleic acids are NOT DETECTED.  The SARS-CoV-2 RNA is generally  detectable in upper respiratory specimens during the acute phase of infection. The lowest concentration of SARS-CoV-2 viral copies this assay can detect is 138 copies/mL. A negative result does not preclude SARS-Cov-2 infection and should not be used as the sole basis for treatment or other patient management decisions. A negative result may occur with  improper specimen collection/handling, submission of specimen other than nasopharyngeal swab, presence of viral mutation(s) within the areas targeted by this assay, and inadequate number of  viral copies(<138 copies/mL). A negative result must be combined with clinical observations, patient history, and epidemiological information. The expected result is Negative.  Fact Sheet for Patients:  EntrepreneurPulse.com.au  Fact Sheet for Healthcare Providers:  IncredibleEmployment.be  This test is no t yet approved or cleared by the [place] FDA and  has been authorized for detection and/or diagnosis of SARS-CoV-2 by FDA under an Emergency Use Authorization (EUA). This EUA will remain  in effect (meaning this test can be used) for the duration of the COVID-19 declaration under Section 564(b)(1) of the Act, 21 U.S.C.section 360bbb-3(b)(1), unless the authorization is terminated  or revoked sooner.       Influenza A by PCR NEGATIVE NEGATIVE Final   Influenza B by PCR NEGATIVE NEGATIVE Final    Comment: (NOTE) The Xpert Xpress SARS-CoV-2/FLU/RSV plus assay is intended as an aid in the diagnosis of influenza from Nasopharyngeal swab specimens and should not be used as a sole basis for treatment. Nasal washings and aspirates are unacceptable for Xpert Xpress SARS-CoV-2/FLU/RSV testing.  Fact Sheet for Patients: EntrepreneurPulse.com.au  Fact Sheet for Healthcare Providers: IncredibleEmployment.be  This test is not yet approved or cleared by the [place] FDA and has been authorized for detection and/or diagnosis of SARS-CoV-2 by FDA under an Emergency Use Authorization (EUA). This EUA will remain in effect (meaning this test can be used) for the duration of the COVID-19 declaration under Section 564(b)(1) of the Act, 21 U.S.C. section 360bbb-3(b)(1), unless the authorization is terminated or revoked.  Performed at [facility], [place], [place] [address]   C Difficile Quick Screen w PCR reflex     Status: Abnormal   Collection Time: 11/27/20  5:45 PM   Specimen:  STOOL  Result Value Ref Range Status   C Diff antigen POSITIVE (A) NEGATIVE Final   C Diff toxin NEGATIVE NEGATIVE Final   C Diff interpretation Results are indeterminate. See PCR results.  Final    Comment: Performed at [facility], [address] [place], [place] [address]  C. Diff by PCR, Reflexed     Status: None   Collection Time: 11/27/20  5:45 PM  Result Value Ref Range Status   Toxigenic C. Difficile by PCR NEGATIVE NEGATIVE Final    Comment: Patient is colonized with non toxigenic C. difficile. May not need treatment unless significant symptoms are present. Performed at [facility], [address] [place], [place] [address]     Coagulation Studies: No results for input(s): LABPROT, INR in the last 72 hours.  Urinalysis: Recent Labs    11/28/20 1645  COLORURINE YELLOW  LABSPEC 1.015  PHURINE 5.5  GLUCOSEU NEGATIVE  HGBUR TRACE*  BILIRUBINUR NEGATIVE  KETONESUR NEGATIVE  PROTEINUR 100*  NITRITE NEGATIVE  LEUKOCYTESUR NEGATIVE       Imaging: CT Angio Abd/Pel w/ and/or w/o  Result Date: 11/29/2020 CLINICAL DATA:  Chronic mesenteric ischemia.  Liver transplant. EXAM: CTA ABDOMEN AND PELVIS WITH CONTRAST TECHNIQUE: Multidetector CT imaging of the abdomen and  pelvis was performed using the standard protocol during bolus administration of intravenous contrast. Multiplanar reconstructed images and MIPs were obtained and reviewed to evaluate the vascular anatomy. CONTRAST:  89mL OMNIPAQUE IOHEXOL 350 MG/ML SOLN COMPARISON:  CT 10/18/2020 and previous FINDINGS: VASCULAR Coronary calcifications. Aorta: Mild scattered calcified plaque in the infrarenal segment. No aneurysm, dissection, or stenosis. Celiac: Patent. The common hepatic artery arises separately from the aorta, also widely patent, supplying left hepatic artery. SMA: Patent without evidence of aneurysm, dissection, vasculitis or significant stenosis. Replaced right hepatic arterial supply, an  anatomic variant. Renals: Both renal arteries are patent without evidence of aneurysm, dissection, vasculitis, fibromuscular dysplasia or significant stenosis. IMA: Patent without evidence of aneurysm, dissection, vasculitis or significant stenosis. Inflow: Patent without evidence of aneurysm, dissection, vasculitis or significant stenosis. Proximal Outflow: Bilateral common femoral and visualized portions of the superficial and profunda femoral arteries are patent without evidence of aneurysm, dissection, vasculitis or significant stenosis. Veins: Patent hepatic veins, portal vein, SMV, splenic vein, bilateral renal veins. IVC appears attenuated in its intrahepatic portion. Iliac venous system unremarkable. Review of the MIP images confirms the above findings. NON-VASCULAR Lower chest: Linear scarring or subsegmental atelectasis posteriorly at the right lung base. No pleural effusion. Hepatobiliary: Cholecystectomy clips. No focal liver lesion or biliary ductal dilatation. Pancreas: 2.7 cm fluid collection adjacent to the pancreatic tail, which in retrospect was probably present on the previous noncontrast study. Homogeneous parenchymal enhancement. No ductal dilatation or discrete mass. Spleen: Normal in size without focal abnormality. Adrenals/Urinary Tract: Adrenal glands unremarkable. No hydronephrosis or renal mass. The urinary bladder is incompletely distended. Stomach/Bowel: Stomach is partially distended. Balloon retention gastrojejunostomy catheter extends to just beyond the ligament of Treitz. Lymphatic: No abdominal or pelvic adenopathy. Reproductive: Prostate enlargement Other: Trace pelvic ascites, new since previous. No free air. Bilateral pelvic phleboliths. Musculoskeletal: New mild L5 compression fracture deformity. Stable L2 compression deformity. IMPRESSION: 1. No significant proximal mesenteric arterial occlusive disease to suggest etiology of mesenteric ischemia. 2. Narrowing of the  intrahepatic segment of the IVC. 3. Subacute unhealed L5 compression fracture deformity, new since 10/18/2020. 4. Stable peripancreatic fluid collections. Electronically Signed   By: Lucrezia Europe M.D.   On: 11/29/2020 16:08     Medications:     aspirin EC  81 mg Oral Daily   atorvastatin  40 mg Oral QHS   Chlorhexidine Gluconate Cloth  6 each Topical Q0600   clopidogrel  75 mg Oral Daily   epoetin (EPOGEN/PROCRIT) injection  4,000 Units Intravenous Q T,Th,Sa-HD   escitalopram  20 mg Oral Daily   feeding supplement (NEPRO CARB STEADY)  75 mL/hr Per Tube QPM   lidocaine  1 patch Transdermal Q12H   melatonin  10 mg Oral QHS   methylPREDNISolone (SOLU-MEDROL) injection  20 mg Intravenous Daily   metoCLOPramide  5 mg Oral TID AC   midodrine  10 mg Oral Q T,Th,Sa-HD   mycophenolate  500 mg Oral BID   pantoprazole  40 mg Oral Daily   tacrolimus  7 mg Oral q AM   And   tacrolimus  8 mg Oral QHS   acetaminophen **OR** acetaminophen, ondansetron **OR** ondansetron (ZOFRAN) IV, traZODone  Assessment/ Plan:  Alex Hampton is a 65 y.o. white male with end stage renal disease on hemodialysis, hypertension, liver transplant at [facility] (2021), coronary artery disease, depression, hyperlipidemia who was admitted to [facility] on 11/26/2020 for Generalized weakness [R53.1] Recurrent falls [R29.6] Feeding tube dysfunction, subsequent encounter [T85.598D]  [facility]  Rd TTS Rt IJ Permcath 73kg  End stage renal disease on dialysis: Continue TTS schedule. Received dialysis today, UF  928ml achieved. Next treatment scheduled for Thursday.  2. Anemia of chronic kidney disease Lab Results  Component Value Date   HGB 9.6 (L) 11/30/2020    - Continue low dose EPO with HD treatments  3. Hypertension: 94/66 - continue metorpolol - midodrine before dialysis treatments.   4. Secondary Hyperparathyroidism: low PTH of 80 on 8/9. Holding binders and vitamin D agents.  5. GI symptoms: early satiety,  poor appetite, abdominal pain, nausea.  Patient is on mycophenolate which can cause these symptoms.    LOS: 4 Alex Hampton 8/23/20221:54 PM

## 2020-11-30 NOTE — Discharge Summary (Signed)
[place] [facility] [place] NAME: Alex Hampton    MR#:  [id number]  DATE OF BIRTH:  11/28/1955  DATE OF ADMISSION:  11/26/2020 ADMITTING PHYSICIAN: Athena Masse, MD  DATE OF DISCHARGE: 11/30/2020  4:33 PM  PRIMARY CARE PHYSICIAN: Juluis Pitch, MD    ADMISSION DIAGNOSIS:  Generalized weakness [R53.1] Recurrent falls [R29.6] Feeding tube dysfunction, subsequent encounter [T85.598D]  DISCHARGE DIAGNOSIS:  Principal Problem:   Generalized weakness Active Problems:   Visual hallucination   OSA (obstructive sleep apnea)   Liver transplant recipient [facility])   Anemia of chronic disease   Type 2 diabetes mellitus with diabetic neuropathy, with long-term current use of insulin (HCC)   ESRD on hemodialysis (HCC)   CAD (coronary artery disease)   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HCC)   Recurrent falls   Verbal auditory hallucination   Early satiety   Feeding tube dysfunction   Pancreatic cyst   SECONDARY DIAGNOSIS:   Past Medical History:  Diagnosis Date   Depression    History of cardiac cath    History of heart artery stent    Hyperlipidemia    Hypertension    MI (myocardial infarction) ([place])    Renal disorder     HOSPITAL COURSE:   Visual and auditory hallucinations.  No further reoccurrence.  Likely medication related.  Holding gabapentin.  Tramadol not a good medication with dialysis patients.  The patient wanted to go back on his Reglan.  I mention if he gets any further visual auditory hallucinations he would have to stop the Reglan also. Generalized weakness and falls.  Physical therapy initially recommended rehab.  The patient did want to go home.  Home health ordered for resumption of care. Early satiety and poor appetite.  Patient is on Protonix.  Tube feedings at night.  Likely he will have to eat small meals throughout the day.  Continue Reglan as per patient request.  In speaking with the transplant team okay to go down on  mycophenolate down to 500 mg twice a day which could be contributing to his GI symptoms.  CT angiogram does not show any blockages in the mesenteric arteries.  The patient although does have a 2.7 cm fluid collection adjacent to the pancreatic tail.  Not sure if this is causing any of his symptoms or not.  Can consider an endoscopic ultrasound as outpatient for further evaluation.  Of note these fluid collections were seen on prior CAT scans at [facility] also. Liver transplant.  Continue immunosuppressive's.  In speaking with the transplant team mycophenolate decreased down to 500 mg twice a day. Gastrostomy tube was replaced during this hospital course.  Continue tube feedings at night. End-stage renal disease on dialysis Tuesday Thursday and Saturday. Hypotension on midodrine prior to dialysis but with blood pressure being on the lower side will prescribe daily.  Family will take blood pressure at home and if continues to be low can potentially increase this medication up to 3 times a day. Anemia of chronic disease.  Hemoglobins were ranging between 7.9 and 8.1.  Last hemoglobin 9.6 after dialysis today. Subacute L5 compression fracture, prior L2 compression fracture.  Hesitant on Fosamax with his poor appetite and early satiety.  DISCHARGE CONDITIONS:   Satisfactory  CONSULTS OBTAINED:  Treatment Team:  Patrecia Pour, NP  DRUG ALLERGIES:  No Known Allergies  DISCHARGE MEDICATIONS:   Allergies as of 11/30/2020   No Known Allergies      Medication List  STOP taking these medications    amoxicillin 500 MG tablet Commonly known as: AMOXIL   clarithromycin 500 MG tablet Commonly known as: BIAXIN   gabapentin 100 MG capsule Commonly known as: NEURONTIN   hydrOXYzine 25 MG capsule Commonly known as: VISTARIL   metoprolol tartrate 25 MG tablet Commonly known as: LOPRESSOR   sulfamethoxazole-trimethoprim 800-160 MG tablet Commonly known as: BACTRIM DS   valGANciclovir 450 MG  tablet Commonly known as: VALCYTE       TAKE these medications    acetaminophen 325 MG tablet Commonly known as: TYLENOL Take 650 mg by mouth every 8 (eight) hours as needed for mild pain or moderate pain.   aspirin 81 MG EC tablet Take 81 mg by mouth daily.   atorvastatin 40 MG tablet Commonly known as: LIPITOR Take 40 mg by mouth at bedtime.   clopidogrel 75 MG tablet Commonly known as: PLAVIX Take 1 tablet (75 mg total) by mouth daily.   docusate 50 MG/5ML liquid Commonly known as: COLACE Take 100 mg by mouth 2 (two) times daily.   escitalopram 20 MG tablet Commonly known as: LEXAPRO Take 20 mg by mouth daily.   lidocaine 5 % Commonly known as: Lidoderm Place 1 patch onto the skin every 12 (twelve) hours. Remove & Discard patch within 12 hours or as directed by MD   melatonin 3 MG Tabs tablet Take 9 mg by mouth at bedtime.   metoCLOPramide 5 MG tablet Commonly known as: REGLAN Take 5 mg by mouth 3 (three) times daily before meals.   midodrine 10 MG tablet Commonly known as: PROAMATINE Take 1 tablet (10 mg total) by mouth daily. What changed: when to take this   mirtazapine 15 MG tablet Commonly known as: REMERON Take 15 mg by mouth at bedtime.   mycophenolate 250 MG capsule Commonly known as: CELLCEPT Take 2 capsules (500 mg total) by mouth 2 (two) times daily. What changed: how much to take   nitroGLYCERIN 0.4 MG SL tablet Commonly known as: NITROSTAT Place 1 tablet (0.4 mg total) under the tongue every 5 (five) minutes as needed for chest pain.   NovaSource Renal Liqd Give by tube. Novasource Renal @ 75 ml/hr 8 hours /night (10 pm -8 am)   NovoLIN R 100 units/mL injection Generic drug: insulin regular Inject 0-11 Units into the skin See admin instructions. Take 6 units at 6pm plus correction with meal time. Correction scale with meals.  Glucose Range  <200 none, 201 - 250 mg/dL 1 unit, 251 - 300 mg/dL 2 units, 301 - 350 mg/dL 3 units, 351- 400  mg/dl 4 units , >400 mg/dL take 5 units and call your provider   ondansetron 4 MG disintegrating tablet Commonly known as: ZOFRAN-ODT Take 4 mg by mouth 2 (two) times daily as needed for nausea.   pantoprazole 40 MG tablet Commonly known as: PROTONIX Take 1 tablet (40 mg total) by mouth daily.   predniSONE 5 MG tablet Commonly known as: DELTASONE Take 15 mg by mouth daily.   senna-docusate 8.6-50 MG tablet Commonly known as: Senokot-S Take 2 tablets by mouth 2 (two) times daily as needed for mild constipation or moderate constipation.   tacrolimus 1 MG capsule Commonly known as: PROGRAF Take 7-8 mg by mouth 2 (two) times daily. Take 7 mg every morning and 8 mg every evening.   traZODone 150 MG tablet Commonly known as: DESYREL Take 150 mg by mouth at bedtime.         DISCHARGE INSTRUCTIONS:  Follow-up PMD 5 days Follow-up with your transplant team  If you experience worsening of your admission symptoms, develop shortness of breath, life threatening emergency, suicidal or homicidal thoughts you must seek medical attention immediately by calling 911 or calling your MD immediately  if symptoms less severe.  You Must read complete instructions/literature along with all the possible adverse reactions/side effects for all the Medicines you take and that have been prescribed to you. Take any new Medicines after you have completely understood and accept all the possible adverse reactions/side effects.   Please note  You were cared for by a hospitalist during your hospital stay. If you have any questions about your discharge medications or the care you received while you were in the hospital after you are discharged, you can call the unit and asked to speak with the hospitalist on call if the hospitalist that took care of you is not available. Once you are discharged, your primary care physician will handle any further medical issues. Please note that NO REFILLS for any discharge  medications will be authorized once you are discharged, as it is imperative that you return to your primary care physician (or establish a relationship with a primary care physician if you do not have one) for your aftercare needs so that they can reassess your need for medications and monitor your lab values.    Today   CHIEF COMPLAINT:   Chief Complaint  Patient presents with   Weakness    HISTORY OF PRESENT ILLNESS:  Garek Schuneman  is a 64 y.o. male coming in with weakness and poor appetite   VITAL SIGNS:  Blood pressure 108/76, pulse 83, temperature 98.5 F (36.9 C), temperature source Oral, resp. rate 16, height 6\' 1"  (1.854 m), weight 72.6 kg, SpO2 99 %.  I/O:   Intake/Output Summary (Last 24 hours) at 11/30/2020 1733 Last data filed at 11/30/2020 1230 Gross per 24 hour  Intake 120 ml  Output 1440 ml  Net -1320 ml    PHYSICAL EXAMINATION:  GENERAL:  65 y.o.-year-old patient lying in the bed with no acute distress.  EYES: Pupils equal, round, reactive to light and accommodation. No scleral icterus. Extraocular muscles intact.  HEENT: Head atraumatic, normocephalic. Oropharynx and nasopharynx clear.  LUNGS: Normal breath sounds bilaterally, no wheezing, rales,rhonchi or crepitation. No use of accessory muscles of respiration.  CARDIOVASCULAR: S1, S2 normal. No murmurs, rubs, or gallops.  ABDOMEN: Soft, non-tender, non-distended.  EXTREMITIES: No pedal edema, cyanosis, or clubbing.  NEUROLOGIC: Cranial nerves II through XII are intact. Muscle strength 5/5 in all extremities. Sensation intact. Gait not checked.  PSYCHIATRIC: The patient is alert and oriented x 3.  SKIN: No obvious rash, lesion, or ulcer.   DATA REVIEW:   CBC Recent Labs  Lab 11/30/20 1312  WBC 5.5  HGB 9.6*  HCT 27.3*  PLT 187    Chemistries  Recent Labs  Lab 11/29/20 0512 11/30/20 0805  NA 133* 135  K 3.2* 3.3*  CL 99 97*  CO2 27 26  GLUCOSE 105* 208*  BUN 33* 42*  CREATININE 2.72*  3.00*  CALCIUM 9.0 8.9  AST 16  --   ALT 12  --   ALKPHOS 88  --   BILITOT 0.6  --      Microbiology Results  Results for orders placed or performed during the hospital encounter of 11/26/20  Resp Panel by RT-PCR (Flu A&B, Covid) Nasopharyngeal Swab     Status: None   Collection Time: 11/26/20 12:58  AM   Specimen: Nasopharyngeal Swab; Nasopharyngeal(NP) swabs in vial transport medium  Result Value Ref Range Status   SARS Coronavirus 2 by RT PCR NEGATIVE NEGATIVE Final    Comment: (NOTE) SARS-CoV-2 target nucleic acids are NOT DETECTED.  The SARS-CoV-2 RNA is generally detectable in upper respiratory specimens during the acute phase of infection. The lowest concentration of SARS-CoV-2 viral copies this assay can detect is 138 copies/mL. A negative result does not preclude SARS-Cov-2 infection and should not be used as the sole basis for treatment or other patient management decisions. A negative result may occur with  improper specimen collection/handling, submission of specimen other than nasopharyngeal swab, presence of viral mutation(s) within the areas targeted by this assay, and inadequate number of viral copies(<138 copies/mL). A negative result must be combined with clinical observations, patient history, and epidemiological information. The expected result is Negative.  Fact Sheet for Patients:  EntrepreneurPulse.com.au  Fact Sheet for Healthcare Providers:  IncredibleEmployment.be  This test is no t yet approved or cleared by the [place] FDA and  has been authorized for detection and/or diagnosis of SARS-CoV-2 by FDA under an Emergency Use Authorization (EUA). This EUA will remain  in effect (meaning this test can be used) for the duration of the COVID-19 declaration under Section 564(b)(1) of the Act, 21 U.S.C.section 360bbb-3(b)(1), unless the authorization is terminated  or revoked sooner.       Influenza A by PCR  NEGATIVE NEGATIVE Final   Influenza B by PCR NEGATIVE NEGATIVE Final    Comment: (NOTE) The Xpert Xpress SARS-CoV-2/FLU/RSV plus assay is intended as an aid in the diagnosis of influenza from Nasopharyngeal swab specimens and should not be used as a sole basis for treatment. Nasal washings and aspirates are unacceptable for Xpert Xpress SARS-CoV-2/FLU/RSV testing.  Fact Sheet for Patients: EntrepreneurPulse.com.au  Fact Sheet for Healthcare Providers: IncredibleEmployment.be  This test is not yet approved or cleared by the [place] FDA and has been authorized for detection and/or diagnosis of SARS-CoV-2 by FDA under an Emergency Use Authorization (EUA). This EUA will remain in effect (meaning this test can be used) for the duration of the COVID-19 declaration under Section 564(b)(1) of the Act, 21 U.S.C. section 360bbb-3(b)(1), unless the authorization is terminated or revoked.  Performed at [facility], [place], [place] [address]   C Difficile Quick Screen w PCR reflex     Status: Abnormal   Collection Time: 11/27/20  5:45 PM   Specimen: STOOL  Result Value Ref Range Status   C Diff antigen POSITIVE (A) NEGATIVE Final   C Diff toxin NEGATIVE NEGATIVE Final   C Diff interpretation Results are indeterminate. See PCR results.  Final    Comment: Performed at [facility], [address] [place], [place] [address]  C. Diff by PCR, Reflexed     Status: None   Collection Time: 11/27/20  5:45 PM  Result Value Ref Range Status   Toxigenic C. Difficile by PCR NEGATIVE NEGATIVE Final    Comment: Patient is colonized with non toxigenic C. difficile. May not need treatment unless significant symptoms are present. Performed at [facility], [address] [place], [place] [address]     RADIOLOGY:  CT Angio Abd/Pel w/ and/or w/o  Result Date: 11/29/2020 CLINICAL DATA:  Chronic mesenteric ischemia.   Liver transplant. EXAM: CTA ABDOMEN AND PELVIS WITH CONTRAST TECHNIQUE: Multidetector CT imaging of the abdomen and pelvis was performed using the standard protocol during bolus administration  of intravenous contrast. Multiplanar reconstructed images and MIPs were obtained and reviewed to evaluate the vascular anatomy. CONTRAST:  69mL OMNIPAQUE IOHEXOL 350 MG/ML SOLN COMPARISON:  CT 10/18/2020 and previous FINDINGS: VASCULAR Coronary calcifications. Aorta: Mild scattered calcified plaque in the infrarenal segment. No aneurysm, dissection, or stenosis. Celiac: Patent. The common hepatic artery arises separately from the aorta, also widely patent, supplying left hepatic artery. SMA: Patent without evidence of aneurysm, dissection, vasculitis or significant stenosis. Replaced right hepatic arterial supply, an anatomic variant. Renals: Both renal arteries are patent without evidence of aneurysm, dissection, vasculitis, fibromuscular dysplasia or significant stenosis. IMA: Patent without evidence of aneurysm, dissection, vasculitis or significant stenosis. Inflow: Patent without evidence of aneurysm, dissection, vasculitis or significant stenosis. Proximal Outflow: Bilateral common femoral and visualized portions of the superficial and profunda femoral arteries are patent without evidence of aneurysm, dissection, vasculitis or significant stenosis. Veins: Patent hepatic veins, portal vein, SMV, splenic vein, bilateral renal veins. IVC appears attenuated in its intrahepatic portion. Iliac venous system unremarkable. Review of the MIP images confirms the above findings. NON-VASCULAR Lower chest: Linear scarring or subsegmental atelectasis posteriorly at the right lung base. No pleural effusion. Hepatobiliary: Cholecystectomy clips. No focal liver lesion or biliary ductal dilatation. Pancreas: 2.7 cm fluid collection adjacent to the pancreatic tail, which in retrospect was probably present on the previous noncontrast  study. Homogeneous parenchymal enhancement. No ductal dilatation or discrete mass. Spleen: Normal in size without focal abnormality. Adrenals/Urinary Tract: Adrenal glands unremarkable. No hydronephrosis or renal mass. The urinary bladder is incompletely distended. Stomach/Bowel: Stomach is partially distended. Balloon retention gastrojejunostomy catheter extends to just beyond the ligament of Treitz. Lymphatic: No abdominal or pelvic adenopathy. Reproductive: Prostate enlargement Other: Trace pelvic ascites, new since previous. No free air. Bilateral pelvic phleboliths. Musculoskeletal: New mild L5 compression fracture deformity. Stable L2 compression deformity. IMPRESSION: 1. No significant proximal mesenteric arterial occlusive disease to suggest etiology of mesenteric ischemia. 2. Narrowing of the intrahepatic segment of the IVC. 3. Subacute unhealed L5 compression fracture deformity, new since 10/18/2020. 4. Stable peripancreatic fluid collections. Electronically Signed   By: Lucrezia Europe M.D.   On: 11/29/2020 16:08      Management plans discussed with the patient, family and they are in agreement.  CODE STATUS:     Code Status Orders  (From admission, onward)           Start     Ordered   11/26/20 0404  Full code  Continuous        11/26/20 0405           Code Status History     Date Active Date Inactive Code Status Order ID Comments User Context   10/15/2020 2147 10/21/2020 2048 Full Code [id number]  Sidney Ace Arvella Merles, MD ED   10/10/2020 2343 10/14/2020 2137 Full Code [id number]  Ivor Costa, MD Inpatient   08/19/2020 1032 08/20/2020 1643 Full Code [id number]  Collier Bullock, MD ED   10/17/2019 0353 10/23/2019 1939 Full Code [id number]  Athena Masse, MD ED      Advance Directive Documentation    Flowsheet Row Most Recent Value  Type of Advance Directive Healthcare Power of Attorney  Pre-existing out of facility DNR order (yellow form or pink MOST form) --  "MOST" Form in Place? --        TOTAL TIME TAKING CARE OF THIS PATIENT: 35 minutes.    Loletha Grayer M.D on 11/30/2020 at 5:33 PM   [facility]  CC: Primary care physician;  Juluis Pitch, MD

## 2020-11-30 NOTE — Progress Notes (Signed)
Spoke with Markus Daft in central telemetry at [phone] to transfer patient from room 224 to [facility]

## 2020-11-30 NOTE — Discharge Instructions (Signed)
No tramadol Hold gabapentin

## 2020-11-30 NOTE — Progress Notes (Signed)
UF off due yo low BP, RN and doctor aware.

## 2021-01-19 ENCOUNTER — Ambulatory Visit: Payer: Medicare Other

## 2021-02-02 ENCOUNTER — Other Ambulatory Visit: Payer: Self-pay

## 2021-02-02 ENCOUNTER — Ambulatory Visit: Payer: Medicare Other | Attending: Gastroenterology

## 2021-02-02 DIAGNOSIS — R269 Unspecified abnormalities of gait and mobility: Secondary | ICD-10-CM | POA: Diagnosis not present

## 2021-02-02 DIAGNOSIS — R2681 Unsteadiness on feet: Secondary | ICD-10-CM | POA: Diagnosis present

## 2021-02-02 DIAGNOSIS — M6281 Muscle weakness (generalized): Secondary | ICD-10-CM

## 2021-02-02 DIAGNOSIS — R296 Repeated falls: Secondary | ICD-10-CM | POA: Diagnosis present

## 2021-02-02 DIAGNOSIS — R262 Difficulty in walking, not elsewhere classified: Secondary | ICD-10-CM

## 2021-02-02 NOTE — Therapy (Signed)
[place] MAIN [facility] SERVICES [address] [place], [place], [address] Phone: [phone]   Fax:  [phone]  Physical Therapy Evaluation  Patient Details  Name: Alex Hampton MRN: [id number] Date of Birth: 1956/03/30 No data recorded  Encounter Date: 02/02/2021   PT End of Session - 02/02/21 1817     Visit Number 1    Number of Visits 25    Date for PT Re-Evaluation 04/27/21    Authorization Type Medicare/BCBS    Authorization Time Period Initial Cert= 17/49/4496- 75/91/6384    Progress Note Due on Visit 10    PT Start Time 1345    PT Stop Time 1445    PT Time Calculation (min) 60 min    Equipment Utilized During Treatment Gait belt    Activity Tolerance Patient limited by fatigue    Behavior During Therapy WFL for tasks assessed/performed             Past Medical History:  Diagnosis Date   Depression    History of cardiac cath    History of heart artery stent    Hyperlipidemia    Hypertension    MI (myocardial infarction) ([place])    Renal disorder     Past Surgical History:  Procedure Laterality Date   IR [place] TUBE CHANGE  11/22/2020   IR [place] TUBE CHANGE  11/26/2020   IR [place] GASTRO/COLONIC TUBE PERCUT W/FLUORO  10/14/2020   LEFT HEART CATH AND CORONARY ANGIOGRAPHY N/A 08/19/2020   Procedure: LEFT HEART CATH AND CORONARY ANGIOGRAPHY;  Surgeon: Corey Skains, MD;  Location: [place] CV LAB;  Service: Cardiovascular;  Laterality: N/A;   LIVER TRANSPLANT      There were no vitals filed for this visit.    Subjective Assessment - 02/02/21 1808     Subjective Patient reports he is here to improve his overall stamina and balance. He report several falls in recent months and states he feels like he was not progressing with Home Health PT and eager to improve his condition. He reports he is planning on having a kidney transplant in 2023.    Patient is accompained by: Family member   [name] (she brought him but did not  come back during eval)   Pertinent History Patient is a 65 year old male with recent referral for abnomality of gait and recent liver transplant in November 2021. He has past medical history significant for Liver transplant (02/18/2020), CKD, End stage renal disease- On hemodialyis T/TH/Sat., Anemia, DM with neuropathy, Non-stemi, Coronary artery disease, Feeding tube with poor appetite.    Limitations Standing;Lifting;House hold activities;Walking    How long can you sit comfortably? no issues    How long can you stand comfortably? < 76min    How long can you walk comfortably? < 2 min    Patient Stated Goals I want to return to my normal self- Independent with all mobility, not fall, with improved overall stamina.    Currently in Pain? No/denies            OBJECTIVE  Musculoskeletal Tremor: Absent Bulk: Normal Tone: Normal, no clonus   Posture Forward head, Protracted shoulders  Gait Decreased step length, decreased heel to toe gait sequencing    Strength R/L 3+/3+ Hip flexion 3+/3+Hip external rotation 3+/3+ Hip internal rotation 3+/3+ Hip extension  3+/3+ Hip abduction 3+/3+Hip adduction 4/4 Knee extension 4/4 Knee flexion 3+/3+ Ankle Plantarflexion 4/4 Ankle Dorsiflexion  *BUE grossly 4/5 except left shoulder  elevation limited to around 80 deg and strength = 3+/5 in available range   NEUROLOGICAL:  Mental Status Patient is oriented to person, place and time.  Recent memory is intact.  Remote memory is intact.  Attention span and concentration are intact.  Expressive speech is intact.  Patient's fund of knowledge is within normal limits for educational level.       Sensation Grossly intact to light touch bilateral UEs/LEs as determined by testing dermatomes C2-T2/L2-S2 respectively. Proprioception and hot/cold testing deferred on this date    FUNCTIONAL OUTCOME MEASURES   Results Comments          FOTO 43   TUG 27.30 seconds Fall risk, in need  of intervention  5TSTS  28.22 seconds BUE support  6 Minute Walk Test 83 feet Limited to 37min 10 sec of amb - no AD  10 Meter Gait Speed Self-selected: 23.6s = 0.42 m/s Below normative values for full community ambulation                ASSESSMENT Clinical Impression: Pt is a pleasant 65 year-old male referred for abnormality of gait from recent Liver transplant surgery last Nov . PT examination reveals deficits including decreased BLE strength, impaired gait as seen by decreased gait speed and decreased step length and heel to toe sequencing;  and decreased functional endurance as seen by very limited time/distance with walking along with impaired balance. He will benefit from skilled PT services to address these deficits, improve balance/mobility and quality of life while decreasing risk for future falls.               [facility] PT Assessment - 02/02/21 1405       Assessment   Medical Diagnosis Abnormality of gait: liver transplant    Hand Dominance Right    Prior Therapy Yes   HHPT For past several months- Felt like I wasn't making progress.     Precautions   Precautions [place] residence    Living Arrangements Alone    Available Help at Discharge Personal care attendant   8 hour/day- Cleaning, cooking,   Type of [place] --   curb step only   Home Layout One level    [place] - single point;Walker - 4 wheels;Wheelchair - [employer]      Prior Function   Level of Independence Independent    Vocation Retired   Engineer, agricultural Professor at [employer]   Overall Cognitive Status Within [employer] for tasks assessed      Observation/Other Assessments   Skin Integrity feeding tube    Focus on Therapeutic Outcomes (FOTO)  43                        Objective measurements completed on examination: See above findings.                PT  Education - 02/02/21 1816     Education Details PT plan of care; Importance of nutrition/calorie intact to have fuel for Exercise.    Person(s) Educated Patient    Methods Explanation;Verbal cues    Comprehension Verbalized understanding;Need further instruction              PT Short Term Goals - 02/02/21 1834       PT SHORT TERM GOAL #1   Title Pt  will be independent with HEP in order to improve strength and balance in order to decrease fall risk and improve function at home and work.    Baseline 02/02/2021: Patient reports limited HEP in place currently from [facility] agency    Time 6    Period Weeks    Status New    Target Date 03/16/21               PT Long Term Goals - 02/02/21 1835       PT LONG TERM GOAL #1   Title Pt will improve FOTO to target score of 50 to display perceived improvements in ability to complete ADL's.    Baseline 02/02/2021= 43    Time 12    Period Weeks    Status New    Target Date 04/27/21      PT LONG TERM GOAL #2   Title Pt will decrease 5TSTS by at least 8 seconds in order to demonstrate clinically significant improvement in LE strength.    Baseline 02/02/2021= 28.22 sec with BUE Support    Time 12    Period Weeks    Status New    Target Date 04/27/21      PT LONG TERM GOAL #3   Title Pt will decrease TUG to below 19 seconds/decrease in order to demonstrate decreased fall risk.    Baseline 02/02/2021= 27.30 sec without AD    Time 12    Period Weeks    Status New    Target Date 04/27/21      PT LONG TERM GOAL #4   Title Pt will increase 6MWT by at least 43m (131ft) in order to demonstrate clinically significant improvement in cardiopulmonary endurance and community ambulation    Baseline 02/02/2021= 83 feet in 1 min 10 sec wihtout an AD    Time 12    Period Weeks    Status New    Target Date 04/27/21      PT LONG TERM GOAL #5   Title Pt will increase 10MWT by at least 0.2 m/s in order to demonstrate clinically significant  improvement in community ambulation.    Baseline 02/02/2021= 0.42 m/s    Time 12    Period Weeks    Status New    Target Date 04/27/21                    Plan - 02/02/21 1819     Clinical Impression Statement Pt is a pleasant 65 year-old male referred for abnormality of gait from recent Liver transplant surgery last Nov . PT examination reveals deficits including decreased BLE strength, impaired gait as seen by decreased gait speed and decreased step length and heel to toe sequencing;  and decreased functional endurance as seen by very limited time/distance with walking along with impaired balance. He will benefit from skilled PT services to address these deficits, improve balance/mobility and quality of life while decreasing risk for future falls.    Personal Factors and Comorbidities Comorbidity 3+    Comorbidities DM, Liver transplant, ESRD, CAD    Examination-Activity Limitations Carry;Lift;Reach Overhead;Squat;Stairs;Stand;Transfers;Toileting    Examination-Participation Restrictions Cleaning;Community Activity;Driving;Medication Management;Meal Prep;Yard Work    Merchant navy officer Evolving/Moderate complexity    Clinical Decision Making Moderate    Rehab Potential Fair    PT Frequency 2x / week    PT Duration 12 weeks    PT Treatment/Interventions ADLs/Self Care Home Management;Cryotherapy;Moist Heat;DME Instruction;Gait training;Stair training;Functional mobility training;Therapeutic activities;Therapeutic exercise;Balance training;Neuromuscular re-education;Patient/family education;Wheelchair  mobility training;Manual techniques;Passive range of motion;Dry needling;Energy conservation;Joint Manipulations    PT Next Visit Plan Implement Therapeutic exercises, Balance training, transfer/gait training    PT Home Exercise Plan Initiate next 1-2 sessions.    Recommended Other Services Will monitior to see if any OT warranted    Consulted and Agree with Plan of  Care Patient             Patient will benefit from skilled therapeutic intervention in order to improve the following deficits and impairments:  Abnormal gait, Cardiopulmonary status limiting activity, Decreased activity tolerance, Decreased balance, Decreased coordination, Decreased endurance, Decreased mobility, Decreased range of motion, Decreased strength, Difficulty walking, Hypomobility, Impaired flexibility, Impaired UE functional use  Visit Diagnosis: Abnormality of gait and mobility  Difficulty in walking, not elsewhere classified  Muscle weakness (generalized)  Unsteadiness on feet  Repeated falls     Problem List Patient Active Problem List   Diagnosis Date Noted   Pancreatic cyst    Verbal auditory hallucination    Early satiety    Feeding tube dysfunction    Generalized weakness 11/26/2020   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[place]) 11/26/2020   Recurrent falls 11/26/2020   Syncope 10/15/2020   Uremia 10/10/2020   ESRD on hemodialysis ([place]) 10/10/2020   Hyperkalemia 10/10/2020   Elevated troponin 10/10/2020   GERD (gastroesophageal reflux disease) 10/10/2020   CAD (coronary artery disease) 10/10/2020   G tube feedings ([place]) 10/10/2020   Diarrhea 10/10/2020   Type II diabetes mellitus with renal manifestations ([place]) 10/10/2020   Weakness    ACS (acute coronary syndrome) ([place])    Liver transplant recipient [facility])    Anemia of chronic disease    Type 2 diabetes mellitus with diabetic neuropathy, with long-term current use of insulin ([place])    ESRD (end stage renal disease) ([place]) 08/19/2020   History of anemia due to chronic kidney disease 08/19/2020   Gait abnormality 10/17/2019   History of fall within past 90 days 10/17/2019   Visual hallucination 10/17/2019   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 [place], [place], [address] Phone: [phone]   Fax:  [phone]  Name: YASIEL GOYNE MRN: [id number] Date of Birth: [birth year]

## 2021-02-09 ENCOUNTER — Ambulatory Visit: Payer: Medicare Other | Attending: Gastroenterology | Admitting: Physical Therapy

## 2021-02-09 ENCOUNTER — Other Ambulatory Visit: Payer: Self-pay

## 2021-02-09 ENCOUNTER — Encounter: Payer: Self-pay | Admitting: Physical Therapy

## 2021-02-09 DIAGNOSIS — R2689 Other abnormalities of gait and mobility: Secondary | ICD-10-CM | POA: Insufficient documentation

## 2021-02-09 DIAGNOSIS — R2681 Unsteadiness on feet: Secondary | ICD-10-CM | POA: Diagnosis present

## 2021-02-09 DIAGNOSIS — R262 Difficulty in walking, not elsewhere classified: Secondary | ICD-10-CM | POA: Insufficient documentation

## 2021-02-09 DIAGNOSIS — M6281 Muscle weakness (generalized): Secondary | ICD-10-CM | POA: Insufficient documentation

## 2021-02-09 DIAGNOSIS — R278 Other lack of coordination: Secondary | ICD-10-CM | POA: Diagnosis present

## 2021-02-09 DIAGNOSIS — R269 Unspecified abnormalities of gait and mobility: Secondary | ICD-10-CM | POA: Insufficient documentation

## 2021-02-09 NOTE — Therapy (Signed)
[place] MAIN [facility] SERVICES [address] [place], [place], [address] Phone: [phone]   Fax:  [phone]  Physical Therapy Treatment  Patient Details  Name: Alex Hampton MRN: [id number] Date of Birth: 12-Jan-1956 No data recorded  Encounter Date: 02/09/2021   PT End of Session - 02/09/21 1156     Visit Number 2    Number of Visits 25    Date for PT Re-Evaluation 04/27/21    Authorization Type Medicare/BCBS    Authorization Time Period Initial Cert= 95/63/8756- 43/32/9518    Progress Note Due on Visit 10    PT Start Time 1146    PT Stop Time 1230    PT Time Calculation (min) 44 min    Equipment Utilized During Treatment Gait belt    Activity Tolerance Patient limited by fatigue    Behavior During Therapy WFL for tasks assessed/performed             Past Medical History:  Diagnosis Date   Depression    History of cardiac cath    History of heart artery stent    Hyperlipidemia    Hypertension    MI (myocardial infarction) ([place])    Renal disorder     Past Surgical History:  Procedure Laterality Date   IR [place] TUBE CHANGE  11/22/2020   IR [place] TUBE CHANGE  11/26/2020   IR [place] GASTRO/COLONIC TUBE PERCUT W/FLUORO  10/14/2020   LEFT HEART CATH AND CORONARY ANGIOGRAPHY N/A 08/19/2020   Procedure: LEFT HEART CATH AND CORONARY ANGIOGRAPHY;  Surgeon: Corey Skains, MD;  Location: [place] CV LAB;  Service: Cardiovascular;  Laterality: N/A;   LIVER TRANSPLANT      There were no vitals filed for this visit.   Subjective Assessment - 02/09/21 1152     Subjective Pt states he "has been better" today. States he was unable to eat yesterday due to stomach troubles. He vomited this morning and continues to report nausea. Has eaten only a little bit today. Also had a blood draw this morning. He states he has appointments set up with [facility] next week. Denies pain however states his abdominal area is uncomfortable.    Patient is  accompained by: Family member   [name] (she brought him but did not come back during eval)   Pertinent History Patient is a 65 year old male with recent referral for abnomality of gait and recent liver transplant in November 2021. He has past medical history significant for Liver transplant (02/18/2020), CKD, End stage renal disease- On hemodialyis T/TH/Sat., Anemia, DM with neuropathy, Non-stemi, Coronary artery disease, Feeding tube with poor appetite.    Limitations Standing;Lifting;House hold activities;Walking    How long can you sit comfortably? no issues    How long can you stand comfortably? < 25min    How long can you walk comfortably? < 2 min    Patient Stated Goals I want to return to my normal self- Independent with all mobility, not fall, with improved overall stamina.    Currently in Pain? No/denies              Vitals: 119/78 - sititng 97/70 - standing   INTERVENTIONS  Therapeutic Exercise  NuStep for muscular and cardiovascular endurance, level 1, Seat 13; 4 minutes with a break at 3 minutes. VC to maintain SPM at 40. Seated using 3# AW: Hip flexion/march, 2x10 BLE LAQ, 2x10 BLE Hip flexion/abd arch over hedgehog, 2x10 BLE VC to sit  forward without back support for increased core activation.   Row, BUE, GTB, 2 x 10 Iron cross YTB pull apart (performed single UE at a time due to significant imbalance in muscle strength), 2 x 10 BUE  Standing at support bar: Hip flexion, 3# AW, x10 BLE Hamstring curl, 3# AW x10 BLE *standing exercises limited by symptomatic orthostatic hypotension    MedBridge HEP: [id number]     Clinical Impression: Pt arrived feeling nauseous with low energy.  However he did demonstrate excellent motivation throughout session and is eager to incorporate standing exercises into program when appropriate. He was  limited to 4 minutes on the NuStep with one rest break during the 4 minute bout, limited due to fatigue and lack of energy.  Attempted standing activities - pt with symptomatic orthostatic hypotension upon each stand during session. He did complete each seated exercise with fatigue reported after completion of each set. Nausea reported x1 with extended rest break at that point. Multiple breaks taken throughout. Seated HEP provided. Pt will benefit from skilled PT services to address deficits in balance, mobility and QOL while decreasing risk of future falls.           PT Short Term Goals - 02/02/21 1834       PT SHORT TERM GOAL #1   Title Pt will be independent with HEP in order to improve strength and balance in order to decrease fall risk and improve function at home and work.    Baseline 02/02/2021: Patient reports limited HEP in place currently from [facility] agency    Time 6    Period Weeks    Status New    Target Date 03/16/21               PT Long Term Goals - 02/02/21 1835       PT LONG TERM GOAL #1   Title Pt will improve FOTO to target score of 50 to display perceived improvements in ability to complete ADL's.    Baseline 02/02/2021= 43    Time 12    Period Weeks    Status New    Target Date 04/27/21      PT LONG TERM GOAL #2   Title Pt will decrease 5TSTS by at least 8 seconds in order to demonstrate clinically significant improvement in LE strength.    Baseline 02/02/2021= 28.22 sec with BUE Support    Time 12    Period Weeks    Status New    Target Date 04/27/21      PT LONG TERM GOAL #3   Title Pt will decrease TUG to below 19 seconds/decrease in order to demonstrate decreased fall risk.    Baseline 02/02/2021= 27.30 sec without AD    Time 12    Period Weeks    Status New    Target Date 04/27/21      PT LONG TERM GOAL #4   Title Pt will increase 6MWT by at least 63m (124ft) in order to demonstrate clinically significant improvement in cardiopulmonary endurance and community ambulation    Baseline 02/02/2021= 83 feet in 1 min 10 sec wihtout an AD    Time 12    Period Weeks     Status New    Target Date 04/27/21      PT LONG TERM GOAL #5   Title Pt will increase 10MWT by at least 0.2 m/s in order to demonstrate clinically significant improvement in community ambulation.    Baseline 02/02/2021= 0.42 m/s  Time 12    Period Weeks    Status New    Target Date 04/27/21                   Plan - 02/09/21 1258     Clinical Impression Statement Pt arrived feeling nauseous with low energy.   However he did demonstrate excellent motivation throughout session and is eager to incorporate standing exercises into program when appropriate. He was  limited to 4 minutes on the NuStep with one rest break during the 4 minute bout, limited due to fatigue and lack of energy. Attempted standing activities - pt with symptomatic orthostatic hypotension upon each stand during session. He did complete each seated exercise with fatigue reported after completion of each set. Nausea reported x1 with extended rest break at that point. Multiple breaks taken throughout. Seated HEP provided. Pt will benefit from skilled PT services to address deficits in balance, mobility and QOL while decreasing risk of future falls.    Personal Factors and Comorbidities Comorbidity 3+    Comorbidities DM, Liver transplant, ESRD, CAD    Examination-Activity Limitations Carry;Lift;Reach Overhead;Squat;Stairs;Stand;Transfers;Toileting    Examination-Participation Restrictions Cleaning;Community Activity;Driving;Medication Management;Meal Prep;Yard Work    Merchant navy officer Evolving/Moderate complexity    Rehab Potential Fair    PT Frequency 2x / week    PT Duration 12 weeks    PT Treatment/Interventions ADLs/Self Care Home Management;Cryotherapy;Moist Heat;DME Instruction;Gait training;Stair training;Functional mobility training;Therapeutic activities;Therapeutic exercise;Balance training;Neuromuscular re-education;Patient/family education;Wheelchair mobility training;Manual  techniques;Passive range of motion;Dry needling;Energy conservation;Joint Manipulations    PT Next Visit Plan Implement Therapeutic exercises, Balance training, transfer/gait training    PT Home Exercise Plan Initiate next 1-2 sessions.    Consulted and Agree with Plan of Care Patient             Patient will benefit from skilled therapeutic intervention in order to improve the following deficits and impairments:  Abnormal gait, Cardiopulmonary status limiting activity, Decreased activity tolerance, Decreased balance, Decreased coordination, Decreased endurance, Decreased mobility, Decreased range of motion, Decreased strength, Difficulty walking, Hypomobility, Impaired flexibility, Impaired UE functional use  Visit Diagnosis: Abnormality of gait and mobility  Other lack of coordination  Difficulty in walking, not elsewhere classified  Muscle weakness (generalized)  Other abnormalities of gait and mobility  Unsteadiness on feet     Problem List Patient Active Problem List   Diagnosis Date Noted   Pancreatic cyst    Verbal auditory hallucination    Early satiety    Feeding tube dysfunction    Generalized weakness 11/26/2020   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[place]) 11/26/2020   Recurrent falls 11/26/2020   Syncope 10/15/2020   Uremia 10/10/2020   ESRD on hemodialysis ([facility]) 10/10/2020   Hyperkalemia 10/10/2020   Elevated troponin 10/10/2020   GERD (gastroesophageal reflux disease) 10/10/2020   CAD (coronary artery disease) 10/10/2020   G tube feedings ([place]) 10/10/2020   Diarrhea 10/10/2020   Type II diabetes mellitus with renal manifestations ([place]) 10/10/2020   Weakness    ACS (acute coronary syndrome) ([facility])    Liver transplant recipient [facility])    Anemia of chronic disease    Type 2 diabetes mellitus with diabetic neuropathy, with long-term current use of insulin ([place])    ESRD (end stage renal disease) ([place]) 08/19/2020   History of anemia due to chronic kidney disease  08/19/2020   Gait abnormality 10/17/2019   History of fall within past 90 days 10/17/2019   Visual hallucination 10/17/2019   Cirrhosis of liver with ascites ([place])  on CT 10/17/2019   HTN (hypertension) 10/17/2019   History of MI (myocardial infarction) 10/17/2019   OSA (obstructive sleep apnea) 10/17/2019   Nondisplaced comminuted supracondylar fracture without intercondylar fracture of left humerus, subsequent encounter for fracture with nonunion 10/17/2019   Anxiety and depression 10/17/2019    Patrina Levering PT, DPT  [place] [facility] MAIN [facility] SERVICES [address] [place], [place], [address] Phone: [phone]   Fax:  [phone]  Name: Alex Hampton MRN: [id number] Date of Birth: [birth year]

## 2021-02-14 ENCOUNTER — Encounter: Payer: Self-pay | Admitting: Physical Therapy

## 2021-02-14 ENCOUNTER — Other Ambulatory Visit: Payer: Self-pay

## 2021-02-14 ENCOUNTER — Ambulatory Visit: Payer: Medicare Other | Admitting: Physical Therapy

## 2021-02-14 DIAGNOSIS — R2681 Unsteadiness on feet: Secondary | ICD-10-CM

## 2021-02-14 DIAGNOSIS — R262 Difficulty in walking, not elsewhere classified: Secondary | ICD-10-CM

## 2021-02-14 DIAGNOSIS — R2689 Other abnormalities of gait and mobility: Secondary | ICD-10-CM

## 2021-02-14 DIAGNOSIS — R269 Unspecified abnormalities of gait and mobility: Secondary | ICD-10-CM | POA: Diagnosis not present

## 2021-02-14 DIAGNOSIS — M6281 Muscle weakness (generalized): Secondary | ICD-10-CM

## 2021-02-14 DIAGNOSIS — R278 Other lack of coordination: Secondary | ICD-10-CM

## 2021-02-14 NOTE — Therapy (Signed)
[place] MAIN [facility] SERVICES [address] [place], [place], [address] Phone: [phone]   Fax:  [phone]  Physical Therapy Treatment  Patient Details  Name: Alex Hampton MRN: [id number] Date of Birth: 1955/06/15 No data recorded  Encounter Date: 02/14/2021   PT End of Session - 02/14/21 1246     Visit Number 3    Number of Visits 25    Date for PT Re-Evaluation 04/27/21    Authorization Type Medicare/BCBS    Authorization Time Period Initial Cert= 63/33/5456- 25/63/8937    Progress Note Due on Visit 10    PT Start Time 1147    PT Stop Time 1228    PT Time Calculation (min) 41 min    Equipment Utilized During Treatment Gait belt    Activity Tolerance Patient limited by fatigue    Behavior During Therapy WFL for tasks assessed/performed             Past Medical History:  Diagnosis Date   Depression    History of cardiac cath    History of heart artery stent    Hyperlipidemia    Hypertension    MI (myocardial infarction) ([place])    Renal disorder     Past Surgical History:  Procedure Laterality Date   IR [place] TUBE CHANGE  11/22/2020   IR [place] TUBE CHANGE  11/26/2020   IR [place] GASTRO/COLONIC TUBE PERCUT W/FLUORO  10/14/2020   LEFT HEART CATH AND CORONARY ANGIOGRAPHY N/A 08/19/2020   Procedure: LEFT HEART CATH AND CORONARY ANGIOGRAPHY;  Surgeon: Corey Skains, MD;  Location: [place] [facility];  Service: Cardiovascular;  Laterality: N/A;   LIVER TRANSPLANT      There were no vitals filed for this visit.   Subjective Assessment - 02/14/21 1156     Subjective Pt states he is doing "about the same" as last session/ Some nausea reported however has decreased slightly. He does state he has been able to eat and drink more. He also endorses additional weakness compared to usual. Pt is compliant with HEP. Denies pain.    Currently in Pain? No/denies              Vitals: 108/72 - sititng 85/63 - standing 108/77 -  post-exercise   INTERVENTIONS   Therapeutic Exercise   NuStep for muscular and cardiovascular endurance, level 1, Seat 11; 5 minutes. SPM maintained between 40-50.   Row, BUE, GTB, 2 x 10 Chest press, one UE at a time using GTB, 2 x 10 BUE;   Standing at support bar with BUE support: Mini squats, 2 x 10, VC for technique and body mechanics; Lateral step over 6" hurdle, 2 x 10 (5 each direction); Standing march 2 x 10 each side; Standing calf raise, 2 x 10 each side;   Seated rest break taken between each set of exercises due to muscular fatigue.        Clinical Impression: Pt arrives to session stating he feels weak. Vitals were assessed at beginning and end of session - he again had symptomatic OH however symptoms improved with standing time paired with LE exercises. Therex were progressed to include standing exercises along with seated. He did require multiple seated rest breaks due to limited standing tolerance and muscular fatigue. STS reps were limited in second set due to fatigue. Pt also ambulated ~20 feet within gym with close CGA. He improved his performance on the NuStep, completing 4 minutes straight and at an increased stepping  cadence. Pt will benefit from skilled PT services to address deficits in balance, mobility and QOL while decreasing risk of future falls.             PT Short Term Goals - 02/02/21 1834       PT SHORT TERM GOAL #1   Title Pt will be independent with HEP in order to improve strength and balance in order to decrease fall risk and improve function at home and work.    Baseline 02/02/2021: Patient reports limited HEP in place currently from [facility] agency    Time 6    Period Weeks    Status New    Target Date 03/16/21               PT Long Term Goals - 02/02/21 1835       PT LONG TERM GOAL #1   Title Pt will improve FOTO to target score of 50 to display perceived improvements in ability to complete ADL's.    Baseline 02/02/2021= 43     Time 12    Period Weeks    Status New    Target Date 04/27/21      PT LONG TERM GOAL #2   Title Pt will decrease 5TSTS by at least 8 seconds in order to demonstrate clinically significant improvement in LE strength.    Baseline 02/02/2021= 28.22 sec with BUE Support    Time 12    Period Weeks    Status New    Target Date 04/27/21      PT LONG TERM GOAL #3   Title Pt will decrease TUG to below 19 seconds/decrease in order to demonstrate decreased fall risk.    Baseline 02/02/2021= 27.30 sec without AD    Time 12    Period Weeks    Status New    Target Date 04/27/21      PT LONG TERM GOAL #4   Title Pt will increase 6MWT by at least 55m (197ft) in order to demonstrate clinically significant improvement in cardiopulmonary endurance and community ambulation    Baseline 02/02/2021= 83 feet in 1 min 10 sec wihtout an AD    Time 12    Period Weeks    Status New    Target Date 04/27/21      PT LONG TERM GOAL #5   Title Pt will increase 10MWT by at least 0.2 m/s in order to demonstrate clinically significant improvement in community ambulation.    Baseline 02/02/2021= 0.42 m/s    Time 12    Period Weeks    Status New    Target Date 04/27/21                   Plan - 02/14/21 1246     Clinical Impression Statement Pt arrives to session stating he feels weak. Vitals were assessed at beginning and end of session - he again had symptomatic OH however symptoms improved with standing time paired with LE exercises. Therex were progressed to include standing exercises along with seated. He did require multiple seated rest breaks due to limited standing tolerance and muscular fatigue. STS reps were limited in second set due to fatigue. Pt also ambulated ~20 feet within gym with close CGA. He improved his performance on the NuStep, completing 4 minutes straight and at an increased stepping cadence. Pt will benefit from skilled PT services to address deficits in balance, mobility and QOL  while decreasing risk of future falls.    Personal  Factors and Comorbidities Comorbidity 3+    Comorbidities DM, Liver transplant, ESRD, CAD    Examination-Activity Limitations Carry;Lift;Reach Overhead;Squat;Stairs;Stand;Transfers;Toileting    Examination-Participation Restrictions Cleaning;Community Activity;Driving;Medication Management;Meal Prep;Yard Work    [employer] officer Evolving/Moderate complexity    Rehab Potential Fair    PT Frequency 2x / week    PT Duration 12 weeks    PT Treatment/Interventions ADLs/Self Care Home Management;Cryotherapy;Moist Heat;DME Instruction;Gait training;Stair training;Functional mobility training;Therapeutic activities;Therapeutic exercise;Balance training;Neuromuscular re-education;Patient/family education;Wheelchair mobility training;Manual techniques;Passive range of motion;Dry needling;Energy conservation;Joint Manipulations    PT Next Visit Plan Implement Therapeutic exercises, Balance training, transfer/gait training    PT Home Exercise Plan Initiate next 1-2 sessions.    Consulted and Agree with Plan of Care Patient             Patient will benefit from skilled therapeutic intervention in order to improve the following deficits and impairments:  Abnormal gait, Cardiopulmonary status limiting activity, Decreased activity tolerance, Decreased balance, Decreased coordination, Decreased endurance, Decreased mobility, Decreased range of motion, Decreased strength, Difficulty walking, Hypomobility, Impaired flexibility, Impaired UE functional use  Visit Diagnosis: Abnormality of gait and mobility  Other lack of coordination  Difficulty in walking, not elsewhere classified  Unsteadiness on feet  Muscle weakness (generalized)  Other abnormalities of gait and mobility     Problem List Patient Active Problem List   Diagnosis Date Noted   Pancreatic cyst    Verbal auditory hallucination    Early satiety    Feeding tube  dysfunction    Generalized weakness 11/26/2020   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[place]) 11/26/2020   Recurrent falls 11/26/2020   Syncope 10/15/2020   Uremia 10/10/2020   ESRD on hemodialysis (HCC) 10/10/2020   Hyperkalemia 10/10/2020   Elevated troponin 10/10/2020   GERD (gastroesophageal reflux disease) 10/10/2020   CAD (coronary artery disease) 10/10/2020   G tube feedings ([place]) 10/10/2020   Diarrhea 10/10/2020   Type II diabetes mellitus with renal manifestations ([place]) 10/10/2020   Weakness    ACS (acute coronary syndrome) ([place])    Liver transplant recipient [facility])    Anemia of chronic disease    Type 2 diabetes mellitus with diabetic neuropathy, with long-term current use of insulin ([place])    ESRD (end stage renal disease) ([place]) 08/19/2020   History of anemia due to chronic kidney disease 08/19/2020   Gait abnormality 10/17/2019   History of fall within past 90 days 10/17/2019   Visual hallucination 10/17/2019   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 [place], [place], [address] Phone: [phone]   Fax:  [phone]  Name: NAOMI FITTON MRN: [id number] Date of Birth: Nov 09, 1955

## 2021-02-16 ENCOUNTER — Ambulatory Visit: Payer: Medicare Other

## 2021-02-21 ENCOUNTER — Other Ambulatory Visit: Payer: Self-pay

## 2021-02-21 ENCOUNTER — Ambulatory Visit: Payer: Medicare Other

## 2021-02-21 DIAGNOSIS — R269 Unspecified abnormalities of gait and mobility: Secondary | ICD-10-CM

## 2021-02-21 DIAGNOSIS — M6281 Muscle weakness (generalized): Secondary | ICD-10-CM

## 2021-02-21 DIAGNOSIS — R2681 Unsteadiness on feet: Secondary | ICD-10-CM

## 2021-02-21 DIAGNOSIS — R262 Difficulty in walking, not elsewhere classified: Secondary | ICD-10-CM

## 2021-02-21 NOTE — Therapy (Signed)
[place] MAIN [facility] SERVICES [address] [place], [place], [address] Phone: [phone]   Fax:  [phone]  Physical Therapy Treatment  Patient Details  Name: Alex Hampton MRN: [id number] Date of Birth: [birth year] No data recorded  Encounter Date: 02/21/2021   PT End of Session - 02/21/21 1114     Visit Number 4    Number of Visits 25    Date for PT Re-Evaluation 04/27/21    Authorization Type Medicare/BCBS    Authorization Time Period Initial Cert= 58/12/9831- 82/50/5397    Progress Note Due on Visit 10    PT Start Time 1515    PT Stop Time 1558    PT Time Calculation (min) 43 min    Equipment Utilized During Treatment Gait belt    Activity Tolerance Patient limited by fatigue    Behavior During Therapy WFL for tasks assessed/performed             Past Medical History:  Diagnosis Date   Depression    History of cardiac cath    History of heart artery stent    Hyperlipidemia    Hypertension    MI (myocardial infarction) ([place])    Renal disorder     Past Surgical History:  Procedure Laterality Date   IR [place] TUBE CHANGE  11/22/2020   IR [place] TUBE CHANGE  11/26/2020   IR [place] GASTRO/COLONIC TUBE PERCUT W/FLUORO  10/14/2020   LEFT HEART CATH AND CORONARY ANGIOGRAPHY N/A 08/19/2020   Procedure: LEFT HEART CATH AND CORONARY ANGIOGRAPHY;  Surgeon: Corey Skains, MD;  Location: [place] [facility];  Service: Cardiovascular;  Laterality: N/A;   LIVER TRANSPLANT      There were no vitals filed for this visit.   Subjective Assessment - 02/21/21 1521     Subjective Patient reports feeling "worn down" today and states no pain.    Pertinent History Patient is a 65 year old male with recent referral for abnomality of gait and recent liver transplant in November 2021. He has past medical history significant for Liver transplant (02/18/2020), CKD, End stage renal disease- On hemodialyis T/TH/Sat., Anemia, DM with neuropathy,  Non-stemi, Coronary artery disease, Feeding tube with poor appetite.    Limitations Standing;Lifting;House hold activities;Walking    How long can you sit comfortably? no issues    How long can you stand comfortably? < 67min    How long can you walk comfortably? < 2 min    Patient Stated Goals I want to return to my normal self- Independent with all mobility, not fall, with improved overall stamina.    Currently in Pain? No/denies              Interventions:   BP at rest in sitting: 106/69 HR= 75   Nustep interval for cardiovascular endurance.  L0- UE/LE's x 1 min 30 sec L1- UE/LE's x 30 sec  L0- UE/LE's x 1 min 30 sec L2 - UE/LE x 30  L0- 1 min L3 - 30 sec L0- 30 sec  Total distance = 0.15 mi   Forward/backward step over 1/2 foam roll  x 5 with BUE support and 5 with 1 UE Support.   Side step- up/over 1/2 foam roll x 10 (each direction)- Patient requested to sit due to increased overall fatigue.   Seated ham curl (matrix cable system) 7.5 lb. X 12 reps x 1 set and next set x 10 BLE.   Scap retract using matrix cable system 7.5  lb x 12 reps (BUE)  Sit to stand from edge of mat without UE support at varying heights 1) 25 in x 3 reps 2) 24 in x 3 reps 3) 23 in x 3 reps- stopped due to fatigue. VC and Visual demo for correct technique- Patient with increased difficulty with lower height.   Education provided throughout session via VC/TC and demonstration to facilitate movement at target joints and correct muscle activation for all testing and exercises performed.    Clinical Impression: Patient performed well with interval Nustep and all seated therex today without report of nausea or pain. Treatment combing more standing/body weight exercises and seated resistive training and patient was limited mostly by undo fatigue. He was offered short rest breaks as appropriate and able to complete tasks well today. Pt will benefit from skilled PT services to address deficits in  balance, mobility and QOL while decreasing risk of future falls                     PT Education - 02/21/21 1522     Education Details Exercise technique    Person(s) Educated Patient    Methods Explanation;Demonstration;Tactile cues;Verbal cues    Comprehension Verbalized understanding;Returned demonstration;Verbal cues required;Need further instruction;Tactile cues required              PT Short Term Goals - 02/02/21 1834       PT SHORT TERM GOAL #1   Title Pt will be independent with HEP in order to improve strength and balance in order to decrease fall risk and improve function at home and work.    Baseline 02/02/2021: Patient reports limited HEP in place currently from [facility] agency    Time 6    Period Weeks    Status New    Target Date 03/16/21               PT Long Term Goals - 02/02/21 1835       PT LONG TERM GOAL #1   Title Pt will improve FOTO to target score of 50 to display perceived improvements in ability to complete ADL's.    Baseline 02/02/2021= 43    Time 12    Period Weeks    Status New    Target Date 04/27/21      PT LONG TERM GOAL #2   Title Pt will decrease 5TSTS by at least 8 seconds in order to demonstrate clinically significant improvement in LE strength.    Baseline 02/02/2021= 28.22 sec with BUE Support    Time 12    Period Weeks    Status New    Target Date 04/27/21      PT LONG TERM GOAL #3   Title Pt will decrease TUG to below 19 seconds/decrease in order to demonstrate decreased fall risk.    Baseline 02/02/2021= 27.30 sec without AD    Time 12    Period Weeks    Status New    Target Date 04/27/21      PT LONG TERM GOAL #4   Title Pt will increase 6MWT by at least 34m (155ft) in order to demonstrate clinically significant improvement in cardiopulmonary endurance and community ambulation    Baseline 02/02/2021= 83 feet in 1 min 10 sec wihtout an AD    Time 12    Period Weeks    Status New    Target Date  04/27/21      PT LONG TERM GOAL #5   Title Pt  will increase 10MWT by at least 0.2 m/s in order to demonstrate clinically significant improvement in community ambulation.    Baseline 02/02/2021= 0.42 m/s    Time 12    Period Weeks    Status New    Target Date 04/27/21                   Plan - 02/21/21 1114     Clinical Impression Statement Patient performed well with interval Nustep and all seated therex today without report of nausea or pain. Treatment combing more standing/body weight exercises and seated resistive training and patient was limited mostly by undo fatigue. He was offered short rest breaks as appropriate and able to complete tasks well today. Pt will benefit from skilled PT services to address deficits in balance, mobility and QOL while decreasing risk of future falls    Personal Factors and Comorbidities Comorbidity 3+    Comorbidities DM, Liver transplant, ESRD, CAD    Examination-Activity Limitations Carry;Lift;Reach Overhead;Squat;Stairs;Stand;Transfers;Toileting    Examination-Participation Restrictions Cleaning;Community Activity;Driving;Medication Management;Meal Prep;Yard Work    [employer]chant [employer] officer Evolving/Moderate complexity    Rehab Potential Fair    PT Frequency 2x / week    PT Duration 12 weeks    PT Treatment/Interventions ADLs/Self Care Home Management;Cryotherapy;Moist Heat;DME Instruction;Gait training;Stair training;Functional mobility training;Therapeutic activities;Therapeutic exercise;Balance training;Neuromuscular re-education;Patient/family education;Wheelchair mobility training;Manual techniques;Passive range of motion;Dry needling;Energy conservation;Joint Manipulations    PT Next Visit Plan Progressive Therapeutic exercises, Balance training, transfer/gait training    PT Home Exercise Plan No changes or updates today.    Consulted and Agree with Plan of Care Patient             Patient will benefit from skilled  therapeutic intervention in order to improve the following deficits and impairments:  Abnormal gait, Cardiopulmonary status limiting activity, Decreased activity tolerance, Decreased balance, Decreased coordination, Decreased endurance, Decreased mobility, Decreased range of motion, Decreased strength, Difficulty walking, Hypomobility, Impaired flexibility, Impaired UE functional use  Visit Diagnosis: Abnormality of gait and mobility  Difficulty in walking, not elsewhere classified  Muscle weakness (generalized)  Unsteadiness on feet     Problem List Patient Active Problem List   Diagnosis Date Noted   Pancreatic cyst    Verbal auditory hallucination    Early satiety    Feeding tube dysfunction    Generalized weakness 11/26/2020   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[place]) 11/26/2020   Recurrent falls 11/26/2020   Syncope 10/15/2020   Uremia 10/10/2020   ESRD on hemodialysis ([place]) 10/10/2020   Hyperkalemia 10/10/2020   Elevated troponin 10/10/2020   GERD (gastroesophageal reflux disease) 10/10/2020   CAD (coronary artery disease) 10/10/2020   G tube feedings ([place]) 10/10/2020   Diarrhea 10/10/2020   Type II diabetes mellitus with renal manifestations ([place]) 10/10/2020   Weakness    ACS (acute coronary syndrome) ([place])    Liver transplant recipient [facility])    Anemia of chronic disease    Type 2 diabetes mellitus with diabetic neuropathy, with long-term current use of insulin ([place])    ESRD (end stage renal disease) ([place]) 08/19/2020   History of anemia due to chronic kidney disease 08/19/2020   Gait abnormality 10/17/2019   History of fall within past 90 days 10/17/2019   Visual hallucination 10/17/2019   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 [place], [place], [address] Phone: [phone]   Fax:  [phone]  Name: BRNADON EOFF MRN: [id number] Date of Birth: 1955/09/18

## 2021-02-23 ENCOUNTER — Other Ambulatory Visit: Payer: Self-pay

## 2021-02-23 ENCOUNTER — Ambulatory Visit: Payer: Medicare Other | Admitting: Physical Therapy

## 2021-02-23 DIAGNOSIS — R269 Unspecified abnormalities of gait and mobility: Secondary | ICD-10-CM

## 2021-02-23 DIAGNOSIS — R2681 Unsteadiness on feet: Secondary | ICD-10-CM

## 2021-02-23 DIAGNOSIS — R262 Difficulty in walking, not elsewhere classified: Secondary | ICD-10-CM

## 2021-02-23 DIAGNOSIS — M6281 Muscle weakness (generalized): Secondary | ICD-10-CM

## 2021-02-23 NOTE — Therapy (Signed)
[place] MAIN [facility] SERVICES [address] [place], [place], [address] Phone: [phone]   Fax:  [phone]  Physical Therapy Treatment  Patient Details  Name: Alex Hampton MRN: [id number] Date of Birth: 1955/09/05 No data recorded  Encounter Date: 02/23/2021   PT End of Session - 02/23/21 1310     Visit Number 5    Number of Visits 25    Date for PT Re-Evaluation 04/27/21    Authorization Type Medicare/BCBS    Authorization Time Period Initial Cert= 38/18/2993- 71/69/6789    Progress Note Due on Visit 10    PT Start Time 1304    PT Stop Time 1336    PT Time Calculation (min) 32 min    Equipment Utilized During Treatment Gait belt    Activity Tolerance Patient limited by fatigue    Behavior During Therapy WFL for tasks assessed/performed             Past Medical History:  Diagnosis Date   Depression    History of cardiac cath    History of heart artery stent    Hyperlipidemia    Hypertension    MI (myocardial infarction) ([place])    Renal disorder     Past Surgical History:  Procedure Laterality Date   IR [place] TUBE CHANGE  11/22/2020   IR [place] TUBE CHANGE  11/26/2020   IR [place] GASTRO/COLONIC TUBE PERCUT W/FLUORO  10/14/2020   LEFT HEART CATH AND CORONARY ANGIOGRAPHY N/A 08/19/2020   Procedure: LEFT HEART CATH AND CORONARY ANGIOGRAPHY;  Surgeon: Corey Skains, MD;  Location: [place] [facility];  Service: Cardiovascular;  Laterality: N/A;   LIVER TRANSPLANT      There were no vitals filed for this visit.   Subjective Assessment - 02/23/21 1307     Subjective Pt reports no significant changes since last session. Reports he has appointmetn with GI tomorrow in [place] for his ongoing GI issues    Pertinent History Patient is a 65 year old male with recent referral for abnomality of gait and recent liver transplant in November 2021. He has past medical history significant for Liver transplant (02/18/2020), CKD, End stage renal  disease- On hemodialyis T/TH/Sat., Anemia, DM with neuropathy, Non-stemi, Coronary artery disease, Feeding tube with poor appetite.    Limitations Standing;Lifting;House hold activities;Walking    How long can you sit comfortably? no issues    How long can you stand comfortably? < 23min    How long can you walk comfortably? < 2 min    Patient Stated Goals I want to return to my normal self- Independent with all mobility, not fall, with improved overall stamina.               Treatment provided this session  Therex:    Nustep interval for cardiovascular endurance.  L0- UE/LE's x 1 min 30 sec L1- UE/LE's x 30 sec  L0- UE/LE's x 1 min 30 sec L2 - UE/LE x 30  L0- 1 min L3 - 30 sec L0- 30 sec  Total distance = 0.15 mi        Seated ham curl (matrix cable system) 2.5 lb. X 10 reps x 1 set and next set 12.5 # x 10 reps, some nausea and fatigue noted following completion     Scap retract using matrix cable system 7.5 lb 2x 10 reps (BUE) -Pt rated medium difficulty       Neuro Re- Ed:    Forward/backward step  over 1/2 foam roll  x 5 with support    Side step- up/over 1/2 foam roll x 10 (each direction)- Patient requested to sit due to increased overall fatigue.  standing balance on Airex -x 45 sec NBOS -x 45 sec NBOS w/ HT        Pt educated throughout session about proper posture and technique with exercises. Improved exercise technique, movement at target joints, use of target muscles after min to mod verbal, visual, tactile cues.                         PT Education - 02/23/21 1648     Education Details Exercise technique    Person(s) Educated Patient    Methods Explanation;Demonstration;Tactile cues;Verbal cues    Comprehension Verbalized understanding;Returned demonstration;Verbal cues required;Tactile cues required              PT Short Term Goals - 02/02/21 1834       PT SHORT TERM GOAL #1   Title Pt will be independent  with HEP in order to improve strength and balance in order to decrease fall risk and improve function at home and work.    Baseline 02/02/2021: Patient reports limited HEP in place currently from [facility] agency    Time 6    Period Weeks    Status New    Target Date 03/16/21               PT Long Term Goals - 02/02/21 1835       PT LONG TERM GOAL #1   Title Pt will improve FOTO to target score of 50 to display perceived improvements in ability to complete ADL's.    Baseline 02/02/2021= 43    Time 12    Period Weeks    Status New    Target Date 04/27/21      PT LONG TERM GOAL #2   Title Pt will decrease 5TSTS by at least 8 seconds in order to demonstrate clinically significant improvement in LE strength.    Baseline 02/02/2021= 28.22 sec with BUE Support    Time 12    Period Weeks    Status New    Target Date 04/27/21      PT LONG TERM GOAL #3   Title Pt will decrease TUG to below 19 seconds/decrease in order to demonstrate decreased fall risk.    Baseline 02/02/2021= 27.30 sec without AD    Time 12    Period Weeks    Status New    Target Date 04/27/21      PT LONG TERM GOAL #4   Title Pt will increase 6MWT by at least 24m (129ft) in order to demonstrate clinically significant improvement in cardiopulmonary endurance and community ambulation    Baseline 02/02/2021= 83 feet in 1 min 10 sec wihtout an AD    Time 12    Period Weeks    Status New    Target Date 04/27/21      PT LONG TERM GOAL #5   Title Pt will increase 10MWT by at least 0.2 m/s in order to demonstrate clinically significant improvement in community ambulation.    Baseline 02/02/2021= 0.42 m/s    Time 12    Period Weeks    Status New    Target Date 04/27/21                   Plan - 02/23/21 1311     Clinical  Impression Statement Patient performed well with interval Nustep and all seated therex today without report  pain. Treatment combing more standing/body weight exercises and seated  resistive training and patient was limited mostly by fatigue. He was offered short rest breaks as appropriate and able to complete tasks well today.  Patient did elect to finish session early today due to increased fatigue and slight symptoms of nausea.  Patient did complete all exercises as prescribed and did not demonstrate excellent motivation for completion of therapy program.  Pt will benefit from skilled PT services to address deficits in balance, mobility and QOL while decreasing risk of future falls    Personal Factors and Comorbidities Comorbidity 3+    Comorbidities DM, Liver transplant, ESRD, CAD    Examination-Activity Limitations Carry;Lift;Reach Overhead;Squat;Stairs;Stand;Transfers;Toileting    Examination-Participation Restrictions Cleaning;Community Activity;Driving;Medication Management;Meal Prep;Yard Work    [employer] officer Evolving/Moderate complexity    Rehab Potential Fair    PT Frequency 2x / week    PT Duration 12 weeks    PT Treatment/Interventions ADLs/Self Care Home Management;Cryotherapy;Moist Heat;DME Instruction;Gait training;Stair training;Functional mobility training;Therapeutic activities;Therapeutic exercise;Balance training;Neuromuscular re-education;Patient/family education;Wheelchair mobility training;Manual techniques;Passive range of motion;Dry needling;Energy conservation;Joint Manipulations    PT Next Visit Plan Progressive Therapeutic exercises, Balance training, transfer/gait training    PT Home Exercise Plan No changes or updates today.    Consulted and Agree with Plan of Care Patient             Patient will benefit from skilled therapeutic intervention in order to improve the following deficits and impairments:  Abnormal gait, Cardiopulmonary status limiting activity, Decreased activity tolerance, Decreased balance, Decreased coordination, Decreased endurance, Decreased mobility, Decreased range of motion, Decreased strength,  Difficulty walking, Hypomobility, Impaired flexibility, Impaired UE functional use  Visit Diagnosis: Abnormality of gait and mobility  Difficulty in walking, not elsewhere classified  Muscle weakness (generalized)  Unsteadiness on feet     Problem List Patient Active Problem List   Diagnosis Date Noted   Pancreatic cyst    Verbal auditory hallucination    Early satiety    Feeding tube dysfunction    Generalized weakness 11/26/2020   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[place]) 11/26/2020   Recurrent falls 11/26/2020   Syncope 10/15/2020   Uremia 10/10/2020   ESRD on hemodialysis ([place]) 10/10/2020   Hyperkalemia 10/10/2020   Elevated troponin 10/10/2020   GERD (gastroesophageal reflux disease) 10/10/2020   CAD (coronary artery disease) 10/10/2020   G tube feedings ([place]) 10/10/2020   Diarrhea 10/10/2020   Type II diabetes mellitus with renal manifestations ([place]) 10/10/2020   Weakness    ACS (acute coronary syndrome) ([place])    Liver transplant recipient [facility])    Anemia of chronic disease    Type 2 diabetes mellitus with diabetic neuropathy, with long-term current use of insulin ([place])    ESRD (end stage renal disease) ([place]) 08/19/2020   History of anemia due to chronic kidney disease 08/19/2020   Gait abnormality 10/17/2019   History of fall within past 90 days 10/17/2019   Visual hallucination 10/17/2019   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

## 2021-02-28 ENCOUNTER — Other Ambulatory Visit: Payer: Self-pay

## 2021-02-28 ENCOUNTER — Ambulatory Visit: Payer: Medicare Other

## 2021-02-28 DIAGNOSIS — R262 Difficulty in walking, not elsewhere classified: Secondary | ICD-10-CM

## 2021-02-28 DIAGNOSIS — R269 Unspecified abnormalities of gait and mobility: Secondary | ICD-10-CM

## 2021-02-28 DIAGNOSIS — R2681 Unsteadiness on feet: Secondary | ICD-10-CM

## 2021-02-28 DIAGNOSIS — M6281 Muscle weakness (generalized): Secondary | ICD-10-CM

## 2021-02-28 NOTE — Therapy (Signed)
[place] MAIN [facility] SERVICES [address] [place], [place], [address] Phone: [phone]   Fax:  [phone]  Physical Therapy Treatment  Patient Details  Name: Alex Hampton MRN: [id number] Date of Birth: 1955/10/28 No data recorded  Encounter Date: 02/28/2021   PT End of Session - 02/28/21 1353     Visit Number 6    Number of Visits 25    Date for PT Re-Evaluation 04/27/21    Authorization Type Medicare/BCBS    Authorization Time Period Initial Cert= 15/17/6160- 73/71/0626    Progress Note Due on Visit 10    PT Start Time 1345    PT Stop Time 1424    PT Time Calculation (min) 39 min    Equipment Utilized During Treatment Gait belt    Activity Tolerance Patient limited by fatigue    Behavior During Therapy WFL for tasks assessed/performed             Past Medical History:  Diagnosis Date   Depression    History of cardiac cath    History of heart artery stent    Hyperlipidemia    Hypertension    MI (myocardial infarction) ([place])    Renal disorder     Past Surgical History:  Procedure Laterality Date   IR [place]J TUBE CHANGE  11/22/2020   IR [place] TUBE CHANGE  11/26/2020   IR [place] GASTRO/COLONIC TUBE PERCUT W/FLUORO  10/14/2020   LEFT HEART CATH AND CORONARY ANGIOGRAPHY N/A 08/19/2020   Procedure: LEFT HEART CATH AND CORONARY ANGIOGRAPHY;  Surgeon: Corey Skains, MD;  Location: [place] [facility];  Service: Cardiovascular;  Laterality: N/A;   LIVER TRANSPLANT      There were no vitals filed for this visit.   Subjective Assessment - 02/28/21 1351     Subjective Patient reports feeling some better so far this week.    Pertinent History Patient is a 65 year old male with recent referral for abnomality of gait and recent liver transplant in November 2021. He has past medical history significant for Liver transplant (02/18/2020), CKD, End stage renal disease- On hemodialyis T/TH/Sat., Anemia, DM with neuropathy, Non-stemi,  Coronary artery disease, Feeding tube with poor appetite.    Limitations Standing;Lifting;House hold activities;Walking    How long can you sit comfortably? no issues    How long can you stand comfortably? < 87min    How long can you walk comfortably? < 2 min    Patient Stated Goals I want to return to my normal self- Independent with all mobility, not fall, with improved overall stamina.    Currently in Pain? No/denies             Interventions:   Therapeutic exercises:   Nustep Interval -1 min at L0 -1 min at L1 -1 min at L0 -1 min at L2 - 1 min at L0 -1 min at L3 (required use of BUE support)  -1 min at L0  Total distance = 0.20 mi   Postural/LE Strengthening-  -standing Scap row with 7.5 lb (matrix cable system) 2 sets of 12 reps with 1 seated rest break between sets.   Seated hamstring curl (GTB) x 12 reps  Sit to stand x 10 reps with min BUE Support - Patient attempted without UE support - yet unable. He did endorse some dizziness after completing- Vitals assessed: BP-Right arm at rest/sitting= 124/80  mmHg   B Shoulder ext using Matrix 7.5 lb BUE x 12 reps  Seated knee ext with 3lb. Ankle weight x 12 reps BLE   Seated Hip march with 3lb ankle weight x 12 reps BLE  Seated horizontal Shoulder abd with green theraband x 12 reps BUE.  *patient stopped due to fatigue today. Education provided throughout session via VC/TC and demonstration to facilitate movement at target joints and correct muscle activation for all testing and exercises performed.   Clinical Impression: Patient presents today with good motivation. Unable to progress toward more standing due to some dizziness after sit to stand but he was able to rest then continue with resistive seated exercises. He was able to complete several more activities prior to undo fatigue - if provided sufficient rest breaks. Pt will benefit from skilled PT services to address deficits in balance, mobility and QOL while  decreasing risk of future falls                       PT Education - 02/28/21 1352     Education Details Exercise technique    Person(s) Educated Patient    Methods Explanation;Demonstration;Tactile cues;Verbal cues    Comprehension Verbalized understanding;Returned demonstration;Verbal cues required;Need further instruction;Tactile cues required              PT Short Term Goals - 02/02/21 1834       PT SHORT TERM GOAL #1   Title Pt will be independent with HEP in order to improve strength and balance in order to decrease fall risk and improve function at home and work.    Baseline 02/02/2021: Patient reports limited HEP in place currently from [facility] agency    Time 6    Period Weeks    Status New    Target Date 03/16/21               PT Long Term Goals - 02/02/21 1835       PT LONG TERM GOAL #1   Title Pt will improve FOTO to target score of 50 to display perceived improvements in ability to complete ADL's.    Baseline 02/02/2021= 43    Time 12    Period Weeks    Status New    Target Date 04/27/21      PT LONG TERM GOAL #2   Title Pt will decrease 5TSTS by at least 8 seconds in order to demonstrate clinically significant improvement in LE strength.    Baseline 02/02/2021= 28.22 sec with BUE Support    Time 12    Period Weeks    Status New    Target Date 04/27/21      PT LONG TERM GOAL #3   Title Pt will decrease TUG to below 19 seconds/decrease in order to demonstrate decreased fall risk.    Baseline 02/02/2021= 27.30 sec without AD    Time 12    Period Weeks    Status New    Target Date 04/27/21      PT LONG TERM GOAL #4   Title Pt will increase 6MWT by at least 37m (136ft) in order to demonstrate clinically significant improvement in cardiopulmonary endurance and community ambulation    Baseline 02/02/2021= 83 feet in 1 min 10 sec wihtout an AD    Time 12    Period Weeks    Status New    Target Date 04/27/21      PT LONG TERM GOAL  #5   Title Pt will increase 10MWT by at least 0.2 m/s in order to demonstrate clinically  significant improvement in community ambulation.    Baseline 02/02/2021= 0.42 m/s    Time 12    Period Weeks    Status New    Target Date 04/27/21                   Plan - 02/28/21 1550     Clinical Impression Statement Patient presents today with good motivation. Unable to progress toward more standing due to some dizziness after sit to stand but he was able to rest then continue with resistive seated exercises. He was able to complete several more activities prior to undo fatigue - if provided sufficient rest breaks. Pt will benefit from skilled PT services to address deficits in balance, mobility and QOL while decreasing risk of future falls    Personal Factors and Comorbidities Comorbidity 3+    Comorbidities DM, Liver transplant, ESRD, CAD    Examination-Activity Limitations Carry;Lift;Reach Overhead;Squat;Stairs;Stand;Transfers;Toileting    Examination-Participation Restrictions Cleaning;Community Activity;Driving;Medication Management;Meal Prep;Yard Work    [employer]chant [employer] officer Evolving/Moderate complexity    Rehab Potential Fair    PT Frequency 2x / week    PT Duration 12 weeks    PT Treatment/Interventions ADLs/Self Care Home Management;Cryotherapy;Moist Heat;DME Instruction;Gait training;Stair training;Functional mobility training;Therapeutic activities;Therapeutic exercise;Balance training;Neuromuscular re-education;Patient/family education;Wheelchair mobility training;Manual techniques;Passive range of motion;Dry needling;Energy conservation;Joint Manipulations    PT Next Visit Plan Progressive Therapeutic exercises, Balance training, transfer/gait training    PT Home Exercise Plan No changes or updates today.    Consulted and Agree with Plan of Care Patient             Patient will benefit from skilled therapeutic intervention in order to improve the following  deficits and impairments:  Abnormal gait, Cardiopulmonary status limiting activity, Decreased activity tolerance, Decreased balance, Decreased coordination, Decreased endurance, Decreased mobility, Decreased range of motion, Decreased strength, Difficulty walking, Hypomobility, Impaired flexibility, Impaired UE functional use  Visit Diagnosis: Abnormality of gait and mobility  Difficulty in walking, not elsewhere classified  Muscle weakness (generalized)  Unsteadiness on feet     Problem List Patient Active Problem List   Diagnosis Date Noted   Pancreatic cyst    Verbal auditory hallucination    Early satiety    Feeding tube dysfunction    Generalized weakness 11/26/2020   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[place]) 11/26/2020   Recurrent falls 11/26/2020   Syncope 10/15/2020   Uremia 10/10/2020   ESRD on hemodialysis ([place]) 10/10/2020   Hyperkalemia 10/10/2020   Elevated troponin 10/10/2020   GERD (gastroesophageal reflux disease) 10/10/2020   CAD (coronary artery disease) 10/10/2020   G tube feedings ([place]) 10/10/2020   Diarrhea 10/10/2020   Type II diabetes mellitus with renal manifestations ([place]) 10/10/2020   Weakness    ACS (acute coronary syndrome) ([place])    Liver transplant recipient [facility])    Anemia of chronic disease    Type 2 diabetes mellitus with diabetic neuropathy, with long-term current use of insulin ([place])    ESRD (end stage renal disease) ([place]) 08/19/2020   History of anemia due to chronic kidney disease 08/19/2020   Gait abnormality 10/17/2019   History of fall within past 90 days 10/17/2019   Visual hallucination 10/17/2019   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

## 2021-03-01 ENCOUNTER — Other Ambulatory Visit: Payer: Self-pay

## 2021-03-01 ENCOUNTER — Emergency Department
Admission: EM | Admit: 2021-03-01 | Discharge: 2021-03-02 | Disposition: A | Discharge status: Other Acute Inpatient Hospital | Payer: Medicare Other | Attending: Emergency Medicine | Admitting: Emergency Medicine

## 2021-03-01 ENCOUNTER — Emergency Department: Payer: Medicare Other

## 2021-03-01 ENCOUNTER — Encounter: Payer: Self-pay | Admitting: Emergency Medicine

## 2021-03-01 DIAGNOSIS — Z794 Long term (current) use of insulin: Secondary | ICD-10-CM | POA: Insufficient documentation

## 2021-03-01 DIAGNOSIS — Z7982 Long term (current) use of aspirin: Secondary | ICD-10-CM | POA: Insufficient documentation

## 2021-03-01 DIAGNOSIS — R109 Unspecified abdominal pain: Secondary | ICD-10-CM | POA: Insufficient documentation

## 2021-03-01 DIAGNOSIS — R569 Unspecified convulsions: Secondary | ICD-10-CM | POA: Insufficient documentation

## 2021-03-01 DIAGNOSIS — R1110 Vomiting, unspecified: Secondary | ICD-10-CM | POA: Diagnosis not present

## 2021-03-01 DIAGNOSIS — Z87891 Personal history of nicotine dependence: Secondary | ICD-10-CM | POA: Insufficient documentation

## 2021-03-01 DIAGNOSIS — E1140 Type 2 diabetes mellitus with diabetic neuropathy, unspecified: Secondary | ICD-10-CM | POA: Insufficient documentation

## 2021-03-01 DIAGNOSIS — R509 Fever, unspecified: Secondary | ICD-10-CM

## 2021-03-01 DIAGNOSIS — D631 Anemia in chronic kidney disease: Secondary | ICD-10-CM | POA: Diagnosis not present

## 2021-03-01 DIAGNOSIS — I120 Hypertensive chronic kidney disease with stage 5 chronic kidney disease or end stage renal disease: Secondary | ICD-10-CM | POA: Insufficient documentation

## 2021-03-01 DIAGNOSIS — Z992 Dependence on renal dialysis: Secondary | ICD-10-CM | POA: Diagnosis not present

## 2021-03-01 DIAGNOSIS — E1122 Type 2 diabetes mellitus with diabetic chronic kidney disease: Secondary | ICD-10-CM | POA: Insufficient documentation

## 2021-03-01 DIAGNOSIS — I2510 Atherosclerotic heart disease of native coronary artery without angina pectoris: Secondary | ICD-10-CM | POA: Insufficient documentation

## 2021-03-01 DIAGNOSIS — Z955 Presence of coronary angioplasty implant and graft: Secondary | ICD-10-CM | POA: Insufficient documentation

## 2021-03-01 DIAGNOSIS — N186 End stage renal disease: Secondary | ICD-10-CM | POA: Diagnosis not present

## 2021-03-01 DIAGNOSIS — Z79899 Other long term (current) drug therapy: Secondary | ICD-10-CM | POA: Diagnosis not present

## 2021-03-01 DIAGNOSIS — Z20822 Contact with and (suspected) exposure to covid-19: Secondary | ICD-10-CM | POA: Insufficient documentation

## 2021-03-01 LAB — CBC WITH DIFFERENTIAL/PLATELET
Abs Immature Granulocytes: 0.05 10*3/uL (ref 0.00–0.07)
Basophils Absolute: 0 10*3/uL (ref 0.0–0.1)
Basophils Relative: 0 %
Eosinophils Absolute: 0.1 10*3/uL (ref 0.0–0.5)
Eosinophils Relative: 1 %
HCT: 38.2 % — ABNORMAL LOW (ref 39.0–52.0)
Hemoglobin: 12.5 g/dL — ABNORMAL LOW (ref 13.0–17.0)
Immature Granulocytes: 1 %
Lymphocytes Relative: 6 %
Lymphs Abs: 0.4 10*3/uL — ABNORMAL LOW (ref 0.7–4.0)
MCH: 30.3 pg (ref 26.0–34.0)
MCHC: 32.7 g/dL (ref 30.0–36.0)
MCV: 92.7 fL (ref 80.0–100.0)
Monocytes Absolute: 0.3 10*3/uL (ref 0.1–1.0)
Monocytes Relative: 4 %
Neutro Abs: 6.1 10*3/uL (ref 1.7–7.7)
Neutrophils Relative %: 88 %
Platelets: 192 10*3/uL (ref 150–400)
RBC: 4.12 MIL/uL — ABNORMAL LOW (ref 4.22–5.81)
RDW: 15.2 % (ref 11.5–15.5)
WBC: 6.9 10*3/uL (ref 4.0–10.5)
nRBC: 0 % (ref 0.0–0.2)

## 2021-03-01 LAB — URINALYSIS, ROUTINE W REFLEX MICROSCOPIC
Bilirubin Urine: NEGATIVE
Glucose, UA: NEGATIVE mg/dL
Hgb urine dipstick: NEGATIVE
Ketones, ur: NEGATIVE mg/dL
Leukocytes,Ua: NEGATIVE
Nitrite: NEGATIVE
Protein, ur: NEGATIVE mg/dL
Specific Gravity, Urine: 1.035 — ABNORMAL HIGH (ref 1.005–1.030)
pH: 5 (ref 5.0–8.0)

## 2021-03-01 LAB — COMPREHENSIVE METABOLIC PANEL
ALT: 25 U/L (ref 0–44)
AST: 30 U/L (ref 15–41)
Albumin: 4.1 g/dL (ref 3.5–5.0)
Alkaline Phosphatase: 102 U/L (ref 38–126)
Anion gap: 10 (ref 5–15)
BUN: 27 mg/dL — ABNORMAL HIGH (ref 8–23)
CO2: 25 mmol/L (ref 22–32)
Calcium: 9.1 mg/dL (ref 8.9–10.3)
Chloride: 100 mmol/L (ref 98–111)
Creatinine, Ser: 1.73 mg/dL — ABNORMAL HIGH (ref 0.61–1.24)
GFR, Estimated: 43 mL/min — ABNORMAL LOW (ref >60)
Glucose, Bld: 160 mg/dL — ABNORMAL HIGH (ref 70–99)
Potassium: 3.2 mmol/L — ABNORMAL LOW (ref 3.5–5.1)
Sodium: 135 mmol/L (ref 135–145)
Total Bilirubin: 0.6 mg/dL (ref 0.3–1.2)
Total Protein: 7.5 g/dL (ref 6.5–8.1)

## 2021-03-01 LAB — TROPONIN I (HIGH SENSITIVITY)
Troponin I (High Sensitivity): 17 ng/L (ref <18)
Troponin I (High Sensitivity): 28 ng/L — ABNORMAL HIGH (ref <18)

## 2021-03-01 LAB — RESP PANEL BY RT-PCR (FLU A&B, COVID) ARPGX2
Influenza A by PCR: NEGATIVE
Influenza B by PCR: NEGATIVE
SARS Coronavirus 2 by RT PCR: NEGATIVE

## 2021-03-01 LAB — LIPASE, BLOOD: Lipase: 38 U/L (ref 11–51)

## 2021-03-01 LAB — LACTIC ACID, PLASMA: Lactic Acid, Venous: 1.2 mmol/L (ref 0.5–1.9)

## 2021-03-01 MED ORDER — POTASSIUM CHLORIDE 20 MEQ PO PACK
40.0000 meq | PACK | Freq: Once | ORAL | Status: AC
  Administered 2021-03-01: 40 meq via ORAL
  Filled 2021-03-01: qty 2

## 2021-03-01 MED ORDER — POTASSIUM CHLORIDE CRYS ER 20 MEQ PO TBCR
30.0000 meq | EXTENDED_RELEASE_TABLET | Freq: Once | ORAL | Status: DC
  Filled 2021-03-01: qty 2

## 2021-03-01 MED ORDER — IOHEXOL 300 MG/ML  SOLN
100.0000 mL | Freq: Once | INTRAMUSCULAR | Status: AC | PRN
  Administered 2021-03-01: 100 mL via INTRAVENOUS

## 2021-03-01 MED ORDER — PIPERACILLIN-TAZOBACTAM 3.375 G IVPB 30 MIN
3.3750 g | Freq: Once | INTRAVENOUS | Status: AC
  Administered 2021-03-01: 3.375 g via INTRAVENOUS
  Filled 2021-03-01: qty 50

## 2021-03-01 MED ORDER — POTASSIUM CHLORIDE 10 MEQ/100ML IV SOLN: 10.0000 meq | Freq: Once | INTRAVENOUS | Status: DC

## 2021-03-01 MED ORDER — SODIUM CHLORIDE 0.9 % IV SOLN
12.5000 mg | Freq: Once | INTRAVENOUS | Status: AC
  Administered 2021-03-01: 12.5 mg via INTRAVENOUS
  Filled 2021-03-01 (×2): qty 0.5

## 2021-03-01 MED ORDER — SODIUM CHLORIDE 0.9 % IV BOLUS
1000.0000 mL | Freq: Once | INTRAVENOUS | Status: AC
Start: 2021-03-01 — End: 2021-03-01
  Administered 2021-03-01: 1000 mL via INTRAVENOUS

## 2021-03-01 NOTE — ED Provider Notes (Signed)
[facility] Emergency Department Provider Note    ____________________________________________   Event Date/Time   First MD Initiated Contact with Patient 03/01/21 1713     (approximate)  I have reviewed the triage vital signs and the nursing notes.   HISTORY  Chief Complaint Loss of Consciousness   HPI Alex Hampton is a 65 y.o. male, history of hypertension, hyperlipidemia, ESRD on hemodialysis, and liver transplant at [facility] (2021), presenting to the emergency department for evaluation of abdominal pain and vomiting.  Patient states that he has been chronically suffering from nausea for the past several months, however began feeling acutely ill earlier today and subsequently vomited while he was laying down.  He states that he believes that he aspirated some of his vomit, which he has done in the past.  His abdominal pain is central, dull, 5 out of 10, with no associated radiation.  He additionally endorses some shortness of breath since vomiting.  His grandson called EMS who evaluated him on scene.  Per EMS, patient was being assisted towards the ambulance when he syncopized, was guided to the ground, subsequently began exhibiting seizure-like activity for approximately 45 seconds.  After the episode, the patient regained consciousness and was then transported to [facility].  Patient states that he does not remember the details of the event of the syncopal episode, though does admit that he recently suffered from a seizure approximately 2 months ago at home, which he did not seek medical care for.  Denies fever/chills, chest pain, back pain, urinary symptoms, active dizziness/vertigo, or numbness/tingling in his upper or lower extremities.   History limited by: None.  Past Medical History:  Diagnosis Date   Depression    History of cardiac cath    History of heart artery stent    Hyperlipidemia    Hypertension    MI (myocardial infarction) ([place])    Renal disorder      Patient Active Problem List   Diagnosis Date Noted   Pancreatic cyst    Verbal auditory hallucination    Early satiety    Feeding tube dysfunction    Generalized weakness 11/26/2020   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[place]) 11/26/2020   Recurrent falls 11/26/2020   Syncope 10/15/2020   Uremia 10/10/2020   ESRD on hemodialysis ([place]) 10/10/2020   Hyperkalemia 10/10/2020   Elevated troponin 10/10/2020   GERD (gastroesophageal reflux disease) 10/10/2020   CAD (coronary artery disease) 10/10/2020   G tube feedings ([place]) 10/10/2020   Diarrhea 10/10/2020   Type II diabetes mellitus with renal manifestations ([place]) 10/10/2020   Weakness    ACS (acute coronary syndrome) ([place])    Liver transplant recipient [facility])    Anemia of chronic disease    Type 2 diabetes mellitus with diabetic neuropathy, with long-term current use of insulin ([place])    ESRD (end stage renal disease) ([place]) 08/19/2020   History of anemia due to chronic kidney disease 08/19/2020   Gait abnormality 10/17/2019   History of fall within past 90 days 10/17/2019   Visual hallucination 10/17/2019   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   HTN (hypertension) 10/17/2019   History of MI (myocardial infarction) 10/17/2019   OSA (obstructive sleep apnea) 10/17/2019   Nondisplaced comminuted supracondylar fracture without intercondylar fracture of left humerus, subsequent encounter for fracture with nonunion 10/17/2019   Anxiety and depression 10/17/2019    Past Surgical History:  Procedure Laterality Date   IR [place] TUBE CHANGE  11/22/2020   IR [facility]  TUBE CHANGE  11/26/2020   IR [place] GASTRO/COLONIC TUBE PERCUT W/FLUORO  10/14/2020   LEFT HEART CATH AND CORONARY ANGIOGRAPHY N/A 08/19/2020   Procedure: LEFT HEART CATH AND CORONARY ANGIOGRAPHY;  Surgeon: Corey Skains, MD;  Location: [place] CV LAB;  Service: Cardiovascular;  Laterality: N/A;   LIVER TRANSPLANT      Prior to Admission medications   Medication  Sig Start Date End Date Taking? Authorizing Provider  aspirin 81 MG EC tablet Take 81 mg by mouth daily.   Yes [provider]  atorvastatin (LIPITOR) 40 MG tablet Take 40 mg by mouth at bedtime.   Yes [provider]  clopidogrel (PLAVIX) 75 MG tablet Take 1 tablet (75 mg total) by mouth daily. 08/20/20  Yes Wieting, Geremiah, MD  escitalopram (LEXAPRO) 10 MG tablet Take 10 mg by mouth daily. 08/06/20  Yes [provider]  famotidine (PEPCID) 40 MG tablet Take 40 mg by mouth at bedtime. 12/08/20 12/08/21 Yes [provider]  ferrous sulfate 325 (65 FE) MG EC tablet Take 325 mg by mouth every morning. 12/28/20  Yes [provider]  melatonin 3 MG TABS tablet Take 9 mg by mouth at bedtime.   Yes [provider]  metoCLOPramide (REGLAN) 5 MG tablet Take 5 mg by mouth 3 (three) times daily before meals.   Yes [provider]  midodrine (PROAMATINE) 10 MG tablet Take 1 tablet (10 mg total) by mouth daily. Patient taking differently: Take 10 mg by mouth Every Tuesday,Thursday,and Saturday with dialysis. 11/30/20  Yes Wieting, Celeste, MD  mirtazapine (REMERON) 15 MG tablet Take 15 mg by mouth at bedtime. 11/10/20  Yes [provider]  mycophenolate (MYFORTIC) 360 MG TBEC EC tablet Take 360 mg by mouth every 12 (twelve) hours. 12/08/20 12/08/21 Yes [provider]  ondansetron (ZOFRAN-ODT) 4 MG disintegrating tablet Take 4 mg by mouth 2 (two) times daily as needed for nausea.   Yes [provider]  pantoprazole (PROTONIX) 40 MG tablet Take 1 tablet (40 mg total) by mouth daily. Patient taking differently: Take 40 mg by mouth every 12 (twelve) hours. 10/14/20  Yes Enzo Bi, MD  predniSONE (DELTASONE) 5 MG tablet Take 2.5 mg by mouth daily with breakfast.   Yes [provider]  tacrolimus (PROGRAF) 1 MG capsule Take 1 mg by mouth every 12 (twelve) hours.   Yes [provider]  traZODone (DESYREL) 100 MG tablet  Take 50 mg by mouth at bedtime.   Yes [provider]  acetaminophen (TYLENOL) 325 MG tablet Take 650 mg by mouth every 8 (eight) hours as needed for mild pain or moderate pain.    [provider]  docusate (COLACE) 50 MG/5ML liquid Take 100 mg by mouth 2 (two) times daily.    [provider]  lidocaine (LIDODERM) 5 % Place 1 patch onto the skin every 12 (twelve) hours. Remove & Discard patch within 12 hours or as directed by MD 11/09/20 11/09/21  Blake Divine, MD  metoprolol tartrate (LOPRESSOR) 25 MG tablet Take 25 mg by mouth 2 (two) times daily. 12/21/20   [provider]  mycophenolate (CELLCEPT) 250 MG capsule Take 2 capsules (500 mg total) by mouth 2 (two) times daily. Patient not taking: Reported on 03/01/2021 11/30/20   Loletha Grayer, MD  nitroGLYCERIN (NITROSTAT) 0.4 MG SL tablet Place 1 tablet (0.4 mg total) under the tongue every 5 (five) minutes as needed for chest pain. 08/20/20   Loletha Grayer, MD  Ardis Hughs  100 UNIT/ML injection Inject 0-11 Units into the skin See admin instructions. Take 6 units at 6pm plus correction with meal time. Correction scale with meals.  Glucose Range  <200 none, 201 - 250 mg/dL 1 unit, 251 - 300 mg/dL 2 units, 301 - 350 mg/dL 3 units, 351- 400 mg/dl 4 units , >400 mg/dL take 5 units and call your provider    [provider]  Nutritional Supplements (NOVASOURCE RENAL) LIQD Give by tube. Novasource Renal @ 75 ml/hr 8 hours /night (10 pm -8 am)    [provider]  senna-docusate (SENOKOT-S) 8.6-50 MG tablet Take 2 tablets by mouth 2 (two) times daily as needed for mild constipation or moderate constipation. 10/14/20   Enzo Bi, MD    Allergies Patient has no known allergies.  Family History  Problem Relation Age of Onset   Diabetes Mellitus II Mother    Pancreatic cancer Father     Social History Social History   Tobacco Use   Smoking status: Former   Smokeless tobacco: Never  Substance Use  Topics   Alcohol use: Yes    Comment: occasionally   Drug use: Never    Review of Systems  Constitutional: Negative for fever/chills, weight loss, or fatigue.  Eyes: Negative for visual changes or discharge.  ENT: Negative for congestion, hearing changes, or sore throat.  Gastrointestinal: Positive for abdominal pain, nausea, and vomiting.  Negative for diarrhea.  Genitourinary: Negative for dysuria or hematuria.  Musculoskeletal: Negative for back pain or joint pain.  Skin: Negative for rashes or lesions.  Neurological: Negative for headache, syncope, dizziness, tremors, or numbness/tingling.   10-point ROS otherwise negative. ____________________________________________   PHYSICAL EXAM:  VITAL SIGNS: ED Triage Vitals  Enc Vitals Group     BP      Pulse      Resp      Temp      Temp src      SpO2      Weight      Height      Head Circumference      Peak Flow      Pain Score      Pain Loc      Pain Edu?      Excl. in [place]?     Physical Exam Constitutional:      Appearance: Normal appearance.  HENT:     Head: Normocephalic and atraumatic.     Nose: Nose normal.     Mouth/Throat:     Mouth: Mucous membranes are moist.     Pharynx: Oropharynx is clear. No oropharyngeal exudate or posterior oropharyngeal erythema.  Eyes:     Extraocular Movements: Extraocular movements intact.     Conjunctiva/sclera: Conjunctivae normal.     Pupils: Pupils are equal, round, and reactive to light.  Cardiovascular:     Rate and Rhythm: Normal rate and regular rhythm.     Pulses: Normal pulses.     Heart sounds: Normal heart sounds. No murmur heard. Pulmonary:     Effort: Pulmonary effort is normal.     Comments: Rhonchi and wheezing noted in the lower lobes, bilaterally. Abdominal:     General: Abdomen is flat.     Palpations: Abdomen is soft.     Comments: No guarding or distention.  No tenderness with superficial or deep palpation in any other quadrants.  G-tube is in place.   Does not appear clogged or infected around the site.   Musculoskeletal:  General: Normal range of motion.  Skin:    General: Skin is warm and dry.  Neurological:     Mental Status: He is alert and oriented to person, place, and time. Mental status is at baseline.     Comments: Cranial nerves II through XII intact.  Pulse, motor, sensation intact in upper and lower extremities.  Negative cerebellar testing.  Psychiatric:        Mood and Affect: Mood normal.        Behavior: Behavior normal.        Thought Content: Thought content normal.        Judgment: Judgment normal.     ____________________________________________    LABS  (all labs ordered are listed, but only abnormal results are displayed)  Labs Reviewed  COMPREHENSIVE METABOLIC PANEL - Abnormal; Notable for the following components:      Result Value   Potassium 3.2 (*)    Glucose, Bld 160 (*)    BUN 27 (*)    Creatinine, Ser 1.73 (*)    GFR, Estimated 43 (*)    All other components within normal limits  CBC WITH DIFFERENTIAL/PLATELET - Abnormal; Notable for the following components:   RBC 4.12 (*)    Hemoglobin 12.5 (*)    HCT 38.2 (*)    Lymphs Abs 0.4 (*)    All other components within normal limits  URINALYSIS, ROUTINE W REFLEX MICROSCOPIC - Abnormal; Notable for the following components:   Color, Urine YELLOW (*)    APPearance HAZY (*)    Specific Gravity, Urine 1.035 (*)    All other components within normal limits  TROPONIN I (HIGH SENSITIVITY) - Abnormal; Notable for the following components:   Troponin I (High Sensitivity) 28 (*)    All other components within normal limits  RESP PANEL BY RT-PCR (FLU A&B, COVID) ARPGX2  CULTURE, BLOOD (ROUTINE X 2)  CULTURE, BLOOD (ROUTINE X 2)  LIPASE, BLOOD  LACTIC ACID, PLASMA  TROPONIN I (HIGH SENSITIVITY)     ____________________________________________   EKG None.   ____________________________________________    RADIOLOGY I personally  viewed and evaluated these images as part of my medical decision making, as well as reviewing the written report by the radiologist.  ED Provider Interpretation: I agree with the radiologist interpretation.  DG Chest 2 View  Result Date: 03/01/2021 CLINICAL DATA:  Shortness of breath EXAM: CHEST - 2 VIEW COMPARISON:  Chest x-ray 11/26/2020, CT chest 10/16/2019 FINDINGS: Lines and tubes overlie the patient. Right chest wall dialysis catheter with tip overlying the right atrium just distal to the superior caval junction. The heart and mediastinal contours are unchanged. Aortic calcification. Likely atelectasis of the left base. No focal consolidation. No pulmonary edema. No pleural effusion. No pneumothorax. No acute osseous abnormality. IMPRESSION: No active cardiopulmonary disease. Electronically Signed   By: Iven Finn M.D.   On: 03/01/2021 18:41   CT Head Wo Contrast  Result Date: 03/01/2021 CLINICAL DATA:  Seizure EXAM: CT HEAD WITHOUT CONTRAST TECHNIQUE: Contiguous axial images were obtained from the base of the skull through the vertex without intravenous contrast. COMPARISON:  CT 11/26/2020 FINDINGS: Brain: No acute territorial infarction, hemorrhage or intracranial mass. The ventricles are nonenlarged. Vascular: No hyperdense vessels. Vertebral and carotid vascular calcification Skull: Normal. Negative for fracture or focal lesion. Sinuses/Orbits: No acute finding. Other: None IMPRESSION: Negative non contrasted CT appearance of the brain for age Electronically Signed   By: Donavan Foil M.D.   On: 03/01/2021 18:21   CT  ABDOMEN PELVIS W CONTRAST  Result Date: 03/01/2021 CLINICAL DATA:  Nausea vomiting EXAM: CT ABDOMEN AND PELVIS WITH CONTRAST TECHNIQUE: Multidetector CT imaging of the abdomen and pelvis was performed using the standard protocol following bolus administration of intravenous contrast. CONTRAST:  156mL OMNIPAQUE IOHEXOL 300 MG/ML  SOLN COMPARISON:  CT 11/29/2020,  10/18/2020, 10/16/2019 FINDINGS: Lower chest: Lung bases demonstrate mild tree-in-bud nodularity in the lower lobes. No pleural effusion. Coronary vascular calcification. Hepatobiliary: Status post liver transplantation. Status post cholecystectomy. No biliary dilatation. Pancreas: No inflammatory changes. Cystic collections near tail of pancreas appear mildly decreased, for example previously measured 2.8 cm collection now measures 2.2 cm. Small cystic lesion or focal fluid collection near head of the pancreas, measures 23 mm on coronal views, series 5, image 34, previously 28 mm. Spleen: Normal in size without focal abnormality. Adrenals/Urinary Tract: Adrenal glands are normal. Kidneys show no hydronephrosis. The bladder is unremarkable. Stomach/Bowel: Gastro jejunostomy tube is in place with tip at the duodenal jejunal junction. No bowel distension. No acute bowel wall thickening. Scattered colon diverticula Vascular/Lymphatic: Mild aortic atherosclerosis. No aneurysm. No suspicious nodes Reproductive: Slightly enlarged prostate Other: Negative for pelvic effusion or free air. Musculoskeletal: Superior endplate fractures at L2 and L5 are present on previous exam. IMPRESSION: 1. Negative for bowel obstruction or bowel inflammatory process. Gastro jejunostomy tube remains in place with tip positioned at the ligament of Treitz. 2. Status post liver transplant. 3. Slight interval decrease in size of cystic collections at the tail and head of pancreas compared to most recent prior 4. Mild nodular density in the lower lobes which could be secondary to respiratory infection or mild aspiration Electronically Signed   By: Donavan Foil M.D.   On: 03/01/2021 19:14    ____________________________________________   PROCEDURES  Procedures   Medications  sodium chloride 0.9 % bolus 1,000 mL (0 mLs Intravenous Stopped 03/01/21 2104)  promethazine (PHENERGAN) 12.5 mg in sodium chloride 0.9 % 50 mL IVPB (0 mg  Intravenous Stopped 03/01/21 2104)  iohexol (OMNIPAQUE) 300 MG/ML solution 100 mL (100 mLs Intravenous Contrast Given 03/01/21 1757)  potassium chloride (KLOR-CON) packet 40 mEq (40 mEq Oral Given 03/01/21 2323)  piperacillin-tazobactam (ZOSYN) IVPB 3.375 g (0 g Intravenous Stopped 03/02/21 0058)  vancomycin (VANCOCIN) IVPB 1000 mg/200 mL premix (0 mg Intravenous Stopped 03/02/21 0304)    Critical Care performed: No  ____________________________________________   INITIAL IMPRESSION / ASSESSMENT AND PLAN / ED COURSE  Pertinent labs & imaging results that were available during my care of the patient were reviewed by me and considered in my medical decision making (see chart for details).     ASHELY JOSHUA is a 65 y.o. male, history of hypertension, hyperlipidemia, ESRD on hemodialysis, and liver transplant at [facility] (2021), presenting to the emergency department for evaluation of abdominal pain and vomiting.  Upon presentation, patient appears unwell.  He is actively vomiting.  On physical exam, lung sounds reveal rhonchi in the lower lobes, bilaterally.  No significant abdominal tenderness or guarding.  We will go ahead and treat with promethazine 12.5 mg.  Initial labs unremarkable.  CMP shows mild hypokalemia at 3.2 (will supplement with oral potassium).  Mild anemia (12.5) on CBC.  No leukocytosis. Lipase unremarkable.   Head CT is negative. Chest x-ray shows no active cardiopulmonary disease.  CT abdomen/pelvis shows no bowel obstruction or inflammatory process.  Mild nodular densities noted in the lower lobes of the lungs, possibly secondary to infection or mild aspiration. This is consistent with  the patient's reported history of suspected aspiration.  At this time, the etiology of the patient's abdominal pain is unknown. Patient has a chronic history of nausea and vomiting.  His reported history of seizures is concerning, though negative head CT is reassuring for any acute pathology. Left  a message with the patient's liver transplant team coordinator to keep them up to date.   **Transferred care of the patient to Dr. Archie Balboa at 2040.    ____________________________________________   FINAL CLINICAL IMPRESSION(S) / ED DIAGNOSES  Final diagnoses:  Fever, unspecified fever cause     NEW MEDICATIONS STARTED DURING THIS VISIT:  ED Discharge Orders     None        Note:  This document was prepared using Dragon voice recognition software and may include unintentional dictation errors.    Teodoro Spray, [place] 03/02/21 4580    Nance Pear, MD 03/02/21 1501

## 2021-03-01 NOTE — ED Triage Notes (Signed)
Pt arrives via [facility].  Called out for N/V/SHOB possible aspiration.  Upon EMS arrival pt had syncopal episode w/loss of consciousness and seizure like activity witnessed by EMS.  No Hx of seizures.  Hx of liver transplant last year.

## 2021-03-02 ENCOUNTER — Ambulatory Visit: Payer: Medicare Other | Admitting: Physical Therapy

## 2021-03-02 DIAGNOSIS — R509 Fever, unspecified: Secondary | ICD-10-CM | POA: Diagnosis not present

## 2021-03-02 MED ORDER — VANCOMYCIN HCL IN DEXTROSE 1-5 GM/200ML-% IV SOLN
1000.0000 mg | Freq: Once | INTRAVENOUS | Status: AC
  Administered 2021-03-02: 1000 mg via INTRAVENOUS
  Filled 2021-03-02: qty 200

## 2021-03-02 NOTE — ED Provider Notes (Signed)
1:00 AM  Pt continues to be hemodynamically stable, nontoxic.  States nausea and pain are currently controlled.  Spoke with Dr. Dawna Part with [facility].  They agreed to accept patient in transfer to a stepdown bed.  IV Zosyn ordered for patient.  Dr. Edison Pace recommends giving IV vancomycin as well.  Blood cultures pending.  I reviewed all nursing notes and pertinent previous records as available.  I have reviewed and interpreted any EKGs, lab and urine results, imaging (as available).    Jaekwon Mcclune, Delice Bison, DO 03/02/21 0110

## 2021-03-07 ENCOUNTER — Ambulatory Visit: Payer: Medicare Other | Admitting: Physical Therapy

## 2021-03-07 LAB — CULTURE, BLOOD (ROUTINE X 2)
Culture: NO GROWTH
Culture: NO GROWTH

## 2021-03-08 ENCOUNTER — Emergency Department: Payer: Medicare Other

## 2021-03-08 ENCOUNTER — Emergency Department
Admission: EM | Admit: 2021-03-08 | Discharge: 2021-03-08 | Disposition: A | Discharge status: Home / Self Care | Payer: Medicare Other | Attending: Emergency Medicine | Admitting: Emergency Medicine

## 2021-03-08 ENCOUNTER — Other Ambulatory Visit: Payer: Self-pay

## 2021-03-08 DIAGNOSIS — I2510 Atherosclerotic heart disease of native coronary artery without angina pectoris: Secondary | ICD-10-CM | POA: Diagnosis not present

## 2021-03-08 DIAGNOSIS — R079 Chest pain, unspecified: Secondary | ICD-10-CM | POA: Diagnosis present

## 2021-03-08 DIAGNOSIS — Z7982 Long term (current) use of aspirin: Secondary | ICD-10-CM | POA: Diagnosis not present

## 2021-03-08 DIAGNOSIS — Z79899 Other long term (current) drug therapy: Secondary | ICD-10-CM | POA: Insufficient documentation

## 2021-03-08 DIAGNOSIS — Z87891 Personal history of nicotine dependence: Secondary | ICD-10-CM | POA: Diagnosis not present

## 2021-03-08 DIAGNOSIS — Z794 Long term (current) use of insulin: Secondary | ICD-10-CM | POA: Diagnosis not present

## 2021-03-08 DIAGNOSIS — Z992 Dependence on renal dialysis: Secondary | ICD-10-CM | POA: Insufficient documentation

## 2021-03-08 DIAGNOSIS — I120 Hypertensive chronic kidney disease with stage 5 chronic kidney disease or end stage renal disease: Secondary | ICD-10-CM | POA: Insufficient documentation

## 2021-03-08 DIAGNOSIS — N186 End stage renal disease: Secondary | ICD-10-CM | POA: Insufficient documentation

## 2021-03-08 DIAGNOSIS — E1122 Type 2 diabetes mellitus with diabetic chronic kidney disease: Secondary | ICD-10-CM | POA: Insufficient documentation

## 2021-03-08 LAB — HEPATIC FUNCTION PANEL
ALT: 18 U/L (ref 0–44)
AST: 21 U/L (ref 15–41)
Albumin: 3.8 g/dL (ref 3.5–5.0)
Alkaline Phosphatase: 93 U/L (ref 38–126)
Bilirubin, Direct: <0.1 mg/dL (ref 0.0–0.2)
Total Bilirubin: 0.5 mg/dL (ref 0.3–1.2)
Total Protein: 7 g/dL (ref 6.5–8.1)

## 2021-03-08 LAB — CBC
HCT: 34.7 % — ABNORMAL LOW (ref 39.0–52.0)
Hemoglobin: 11 g/dL — ABNORMAL LOW (ref 13.0–17.0)
MCH: 29.5 pg (ref 26.0–34.0)
MCHC: 31.7 g/dL (ref 30.0–36.0)
MCV: 93 fL (ref 80.0–100.0)
Platelets: 230 10*3/uL (ref 150–400)
RBC: 3.73 MIL/uL — ABNORMAL LOW (ref 4.22–5.81)
RDW: 14.8 % (ref 11.5–15.5)
WBC: 6.7 10*3/uL (ref 4.0–10.5)
nRBC: 0 % (ref 0.0–0.2)

## 2021-03-08 LAB — BASIC METABOLIC PANEL
Anion gap: 10 (ref 5–15)
BUN: 60 mg/dL — ABNORMAL HIGH (ref 8–23)
CO2: 26 mmol/L (ref 22–32)
Calcium: 9.5 mg/dL (ref 8.9–10.3)
Chloride: 97 mmol/L — ABNORMAL LOW (ref 98–111)
Creatinine, Ser: 2.8 mg/dL — ABNORMAL HIGH (ref 0.61–1.24)
GFR, Estimated: 24 mL/min — ABNORMAL LOW (ref >60)
Glucose, Bld: 140 mg/dL — ABNORMAL HIGH (ref 70–99)
Potassium: 3.7 mmol/L (ref 3.5–5.1)
Sodium: 133 mmol/L — ABNORMAL LOW (ref 135–145)

## 2021-03-08 LAB — D-DIMER, QUANTITATIVE: D-Dimer, Quant: 1.07 ug/mL-FEU — ABNORMAL HIGH (ref 0.00–0.50)

## 2021-03-08 LAB — TROPONIN I (HIGH SENSITIVITY)
Troponin I (High Sensitivity): 19 ng/L — ABNORMAL HIGH (ref <18)
Troponin I (High Sensitivity): 20 ng/L — ABNORMAL HIGH (ref <18)

## 2021-03-08 LAB — LIPASE, BLOOD: Lipase: 34 U/L (ref 11–51)

## 2021-03-08 MED ORDER — ACETAMINOPHEN 500 MG PO TABS
1000.0000 mg | ORAL_TABLET | Freq: Once | ORAL | Status: AC
  Administered 2021-03-08: 1000 mg via ORAL

## 2021-03-08 MED ORDER — IOHEXOL 350 MG/ML SOLN
80.0000 mL | Freq: Once | INTRAVENOUS | Status: AC | PRN
  Administered 2021-03-08: 80 mL via INTRAVENOUS

## 2021-03-08 NOTE — ED Notes (Signed)
Per Elie Goody, RN, Pt given ice water and crackers by Terrence Dupont, Careers adviser.

## 2021-03-08 NOTE — ED Triage Notes (Signed)
Pt to ED [facility] from home for chest pain that started last night when lying. Denies n/v/shob.  Pt in NAD.

## 2021-03-08 NOTE — Discharge Instructions (Addendum)
Your EKG and cardiac enzymes were reassuring today.  We did obtain a CT scan of your lungs to rule out a blood clot which was also negative.  We do not know exactly what is causing your pain, it may be related to your recent pneumonia.  You can take Tylenol for the pain.  If any symptoms are new or worsening, do not hesitate to return to the emergency department.  Please call your dialysis center tomorrow to see if he can get in for dialysis.

## 2021-03-08 NOTE — ED Notes (Signed)
  Pt to ED with son c/o dull L sideed chest pain when lying flat. Also states he feels muscle-type pain in chest with deep breaths since had uncontrollable hiccupping last week. Pt is liver transplant pt, also has hx 2MIs with 1 stent placed. Pt also has PEG tube and dialysis catheter in R chest.  Son left to get anti rejection meds for pt which he missed this morning. Pt also recently was seen at [facility] for possible aspiration PNA and is being treated with abx just in case.

## 2021-03-08 NOTE — ED Notes (Signed)
Paramedic student at bedside to attempt blood draw.

## 2021-03-08 NOTE — ED Provider Notes (Signed)
[facility]  ____________________________________________   Event Date/Time   First MD Initiated Contact with Patient 03/08/21 1454     (approximate)  I have reviewed the triage vital signs and the nursing notes.   HISTORY  Chief Complaint Chest Pain    HPI Alex Hampton is a 65 y.o. 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  Symptoms started last night.  He endorses pain on the left side of his chest that is sharp.  It is significantly worse when he is lying down and improves with sitting up.  It is nonexertional and he did really does not feel it when he is sitting up.  He denies associated dyspnea has had cough and was recently admitted for pneumonia and is still on Augmentin for this.  He denies any lower extremity edema or history of blood clots.  Patient also complains of the pain in the left lower abdomen by his G-tube that has been going on for the last 2 days.  This pain is also worse with lying down and improved with sitting up.  He denies associated nausea vomiting has had some diarrhea.  Denies fevers or chills.  Patient's last dialysis was on Saturday, was supposed to go today but missed it due to being in the ED.         Past Medical History:  Diagnosis Date   Depression    History of cardiac cath    History of heart artery stent    Hyperlipidemia    Hypertension    MI (myocardial infarction) ([place])    Renal disorder     Patient Active Problem List   Diagnosis Date Noted   Pancreatic cyst    Verbal auditory hallucination    Early satiety    Feeding tube dysfunction    Generalized weakness 11/26/2020   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[place]) 11/26/2020   Recurrent falls 11/26/2020   Syncope 10/15/2020   Uremia 10/10/2020   ESRD on hemodialysis ([place]) 10/10/2020   Hyperkalemia 10/10/2020   Elevated troponin  10/10/2020   GERD (gastroesophageal reflux disease) 10/10/2020   CAD (coronary artery disease) 10/10/2020   G tube feedings ([place]) 10/10/2020   Diarrhea 10/10/2020   Type II diabetes mellitus with renal manifestations ([place]) 10/10/2020   Weakness    ACS (acute coronary syndrome) ([place])    Liver transplant recipient [facility])    Anemia of chronic disease    Type 2 diabetes mellitus with diabetic neuropathy, with long-term current use of insulin ([place])    ESRD (end stage renal disease) ([place]) 08/19/2020   History of anemia due to chronic kidney disease 08/19/2020   Gait abnormality 10/17/2019   History of fall within past 90 days 10/17/2019   Visual hallucination 10/17/2019   Cirrhosis of liver with ascites ([place]) on CT 10/17/2019   HTN (hypertension) 10/17/2019   History of MI (myocardial infarction) 10/17/2019   OSA (obstructive sleep apnea) 10/17/2019   Nondisplaced comminuted supracondylar fracture without intercondylar fracture of left humerus, subsequent encounter for fracture with nonunion 10/17/2019   Anxiety and depression 10/17/2019    Past Surgical History:  Procedure Laterality Date   IR [place] TUBE CHANGE  11/22/2020   IR [place] TUBE CHANGE  11/26/2020   IR [place] GASTRO/COLONIC TUBE PERCUT W/FLUORO  10/14/2020   LEFT HEART CATH AND CORONARY ANGIOGRAPHY N/A 08/19/2020   Procedure: LEFT HEART  CATH AND CORONARY ANGIOGRAPHY;  Surgeon: Corey Skains, MD;  Location: [place] [facility];  Service: Cardiovascular;  Laterality: N/A;   LIVER TRANSPLANT      Prior to Admission medications   Medication Sig Start Date End Date Taking? Authorizing Provider  acetaminophen (TYLENOL) 325 MG tablet Take 650 mg by mouth every 8 (eight) hours as needed for mild pain or moderate pain.    [provider]  aspirin 81 MG EC tablet Take 81 mg by mouth daily.    [provider]  atorvastatin (LIPITOR) 40 MG tablet Take 40 mg by mouth at bedtime.    [provider]  clopidogrel  (PLAVIX) 75 MG tablet Take 1 tablet (75 mg total) by mouth daily. 08/20/20   Loletha Grayer, MD  docusate (COLACE) 50 MG/5ML liquid Take 100 mg by mouth 2 (two) times daily.    [provider]  escitalopram (LEXAPRO) 10 MG tablet Take 10 mg by mouth daily. 08/06/20   [provider]  famotidine (PEPCID) 40 MG tablet Take 40 mg by mouth at bedtime. 12/08/20 12/08/21  [provider]  ferrous sulfate 325 (65 FE) MG EC tablet Take 325 mg by mouth every morning. 12/28/20   [provider]  lidocaine (LIDODERM) 5 % Place 1 patch onto the skin every 12 (twelve) hours. Remove & Discard patch within 12 hours or as directed by MD 11/09/20 11/09/21  Blake Divine, MD  melatonin 3 MG TABS tablet Take 9 mg by mouth at bedtime.    [provider]  metoCLOPramide (REGLAN) 5 MG tablet Take 5 mg by mouth 3 (three) times daily before meals.    [provider]  metoprolol tartrate (LOPRESSOR) 25 MG tablet Take 25 mg by mouth 2 (two) times daily. 12/21/20   [provider]  midodrine (PROAMATINE) 10 MG tablet Take 1 tablet (10 mg total) by mouth daily. Patient taking differently: Take 10 mg by mouth Every Tuesday,Thursday,and Saturday with dialysis. 11/30/20   Loletha Grayer, MD  mirtazapine (REMERON) 15 MG tablet Take 15 mg by mouth at bedtime. 11/10/20   [provider]  mycophenolate (CELLCEPT) 250 MG capsule Take 2 capsules (500 mg total) by mouth 2 (two) times daily. Patient not taking: Reported on 03/01/2021 11/30/20   Loletha Grayer, MD  mycophenolate (MYFORTIC) 360 MG TBEC EC tablet Take 360 mg by mouth every 12 (twelve) hours. 12/08/20 12/08/21  [provider]  nitroGLYCERIN (NITROSTAT) 0.4 MG SL tablet Place 1 tablet (0.4 mg total) under the tongue every 5 (five) minutes as needed for chest pain. 08/20/20   Loletha Grayer, MD  NOVOLIN R 100 UNIT/ML injection Inject 0-11 Units into the skin See admin instructions. Take 6 units at 6pm  plus correction with meal time. Correction scale with meals.  Glucose Range  <200 none, 201 - 250 mg/dL 1 unit, 251 - 300 mg/dL 2 units, 301 - 350 mg/dL 3 units, 351- 400 mg/dl 4 units , >400 mg/dL take 5 units and call your provider    [provider]  Nutritional Supplements (NOVASOURCE RENAL) LIQD Give by tube. Novasource Renal @ 75 ml/hr 8 hours /night (10 pm -8 am)    [provider]  ondansetron (ZOFRAN-ODT) 4 MG disintegrating tablet Take 4 mg by mouth 2 (two) times daily as needed for nausea.    [provider]  pantoprazole (PROTONIX) 40 MG tablet Take 1 tablet (40 mg total) by mouth daily. Patient taking differently: Take 40 mg by mouth every  12 (twelve) hours. 10/14/20   Enzo Bi, MD  predniSONE (DELTASONE) 5 MG tablet Take 2.5 mg by mouth daily with breakfast.    [provider]  senna-docusate (SENOKOT-S) 8.6-50 MG tablet Take 2 tablets by mouth 2 (two) times daily as needed for mild constipation or moderate constipation. 10/14/20   Enzo Bi, MD  tacrolimus (PROGRAF) 1 MG capsule Take 1 mg by mouth every 12 (twelve) hours.    [provider]  traZODone (DESYREL) 100 MG tablet Take 50 mg by mouth at bedtime.    [provider]    Allergies Patient has no known allergies.  Family History  Problem Relation Age of Onset   Diabetes Mellitus II Mother    Pancreatic cancer Father     Social History Social History   Tobacco Use   Smoking status: Former   Smokeless tobacco: Never  Substance Use Topics   Alcohol use: Yes    Comment: occasionally   Drug use: Never    Review of Systems   Review of Systems  Constitutional:  Negative for appetite change, chills and fever.  Respiratory:  Positive for cough. Negative for shortness of breath.   Cardiovascular:  Positive for chest pain.  Gastrointestinal:  Positive for abdominal pain. Negative for nausea and vomiting.  Genitourinary:  Negative for dysuria.  All other systems  reviewed and are negative.  Physical Exam Updated Vital Signs BP 121/80   Pulse 69   Temp 98.5 F (36.9 C) (Oral)   Resp 19   Ht 6\' 1"  (1.854 m)   Wt 80 kg   SpO2 99%   BMI 23.27 kg/m   Physical Exam Vitals and nursing note reviewed.  Constitutional:      General: He is not in acute distress.    Appearance: Normal appearance.  HENT:     Head: Normocephalic and atraumatic.  Eyes:     General: No scleral icterus.    Conjunctiva/sclera: Conjunctivae normal.  Cardiovascular:     Rate and Rhythm: Normal rate and regular rhythm.     Heart sounds: Normal heart sounds.  Pulmonary:     Effort: Pulmonary effort is normal. No respiratory distress.     Breath sounds: Normal breath sounds. No wheezing.  Abdominal:     Palpations: Abdomen is soft.     Comments: Prior surgical scar, G-tube in place without surrounding erythema, abdomen is soft and nontender throughout did not sitting up  Completely supine there is very minimal tenderness to deep palpation in the left upper quadrant  Musculoskeletal:        General: No deformity or signs of injury.     Cervical back: Normal range of motion.  Skin:    Coloration: Skin is not jaundiced or pale.  Neurological:     General: No focal deficit present.     Mental Status: He is alert and oriented to person, place, and time. Mental status is at baseline.  Psychiatric:        Mood and Affect: Mood normal.        Behavior: Behavior normal.     LABS (all labs ordered are listed, but only abnormal results are displayed)  Labs Reviewed  BASIC METABOLIC PANEL - Abnormal; Notable for the following components:      Result Value   Sodium 133 (*)    Chloride 97 (*)    Glucose, Bld 140 (*)    BUN 60 (*)    Creatinine, Ser 2.80 (*)    GFR,  Estimated 24 (*)    All other components within normal limits  CBC - Abnormal; Notable for the following components:   RBC 3.73 (*)    Hemoglobin 11.0 (*)    HCT 34.7 (*)    All other components  within normal limits  D-DIMER, QUANTITATIVE - Abnormal; Notable for the following components:   D-Dimer, Quant 1.07 (*)    All other components within normal limits  TROPONIN I (HIGH SENSITIVITY) - Abnormal; Notable for the following components:   Troponin I (High Sensitivity) 19 (*)    All other components within normal limits  TROPONIN I (HIGH SENSITIVITY) - Abnormal; Notable for the following components:   Troponin I (High Sensitivity) 20 (*)    All other components within normal limits  HEPATIC FUNCTION PANEL  LIPASE, BLOOD   ____________________________________________  EKG  Normal axis, normal intervals, Q waves in the inferior leads, no acute ischemic changes ____________________________________________  RADIOLOGY I, Madelin Headings, personally viewed and evaluated these images (plain radiographs) as part of my medical decision making, as well as reviewing the written report by the radiologist.  ED MD interpretation:  I reviewed the CXR which does not show any acute cardiopulmonary process      ____________________________________________   PROCEDURES  Procedure(s) performed (including Critical Care):  Procedures   ____________________________________________   INITIAL IMPRESSION / ASSESSMENT AND PLAN / ED COURSE  Patient is a 65 year old male with multiple comorbidities including a liver transplant at [place] last year who presents with chest pain.  He was just recently admitted for an aspiration pneumonia and was discharged on Augmentin which she is still taking.  He developed some pleuritic chest pain that is also positional yesterday.  Pain is located near the left ribs, is worse with inspiration and worsened with lying back and significantly improves when he sits forward and was goes away at that point.  He has does have associated cough but no fevers or chills and denies worsening dyspnea.  Never had a PE or DVT.  His cardiac enzymes are mildly elevated but  stable and I suspect this is in the setting of his being dialysis dependent.  On repeat they are 19 and 20.  His EKG is nonischemic.  Chest x-ray does not show any acute cardiopulmonary process.  Given his recent hospitalization and new chest pain I did consider pulmonary embolism and D-dimer was obtained given his relatively low risk.  It was elevated so CT angio was obtained which is negative for PE. Discussed with the patient that we really do not know what is causing it but may be pleurisy related to his recent pneumonia versus reflux versus MSK.  Return precautions given.  He is stable for discharge.  I did tell him that he should call his dialysis center first thing in the morning to see if he can get in tomorrow.  He received IV contrast and should be dialyzed over the next 48 hours.  Even if he is not able to get in and tomorrow he has no urgent need for dialysis at this time, potassium is 3.7 he has no evidence of volume overload or pulmonary edema.  I think he could potentially wait until Thursday if he is not able to get in [place]. Clinical Course as of 03/08/21 1811  Tue Mar 08, 2021  1640 D-Dimer, America Brown(!): 1.07 [KM]    Clinical Course User Index [KM] Rada Hay, MD     ____________________________________________   FINAL CLINICAL IMPRESSION(S) / ED DIAGNOSES  Final diagnoses:  Chest pain, unspecified type     ED Discharge Orders     None        Note:  This document was prepared using Dragon voice recognition software and may include unintentional dictation errors.    Rada Hay, MD 03/08/21 [phone]

## 2021-03-09 ENCOUNTER — Ambulatory Visit: Payer: Medicare Other | Admitting: Physical Therapy

## 2021-03-14 ENCOUNTER — Other Ambulatory Visit: Payer: Self-pay

## 2021-03-14 ENCOUNTER — Ambulatory Visit: Payer: Medicare Other | Attending: Gastroenterology

## 2021-03-14 DIAGNOSIS — R269 Unspecified abnormalities of gait and mobility: Secondary | ICD-10-CM | POA: Insufficient documentation

## 2021-03-14 DIAGNOSIS — R2689 Other abnormalities of gait and mobility: Secondary | ICD-10-CM | POA: Insufficient documentation

## 2021-03-14 DIAGNOSIS — R2681 Unsteadiness on feet: Secondary | ICD-10-CM | POA: Insufficient documentation

## 2021-03-14 DIAGNOSIS — R296 Repeated falls: Secondary | ICD-10-CM | POA: Insufficient documentation

## 2021-03-14 DIAGNOSIS — R278 Other lack of coordination: Secondary | ICD-10-CM | POA: Diagnosis present

## 2021-03-14 DIAGNOSIS — R262 Difficulty in walking, not elsewhere classified: Secondary | ICD-10-CM | POA: Diagnosis present

## 2021-03-14 DIAGNOSIS — M6281 Muscle weakness (generalized): Secondary | ICD-10-CM | POA: Diagnosis present

## 2021-03-14 NOTE — Therapy (Signed)
[place] MAIN [facility] SERVICES [address] [place], [place], [address] Phone: [phone]   Fax:  [phone]  Physical Therapy Treatment  Patient Details  Name: Alex Hampton MRN: [id number] Date of Birth: 02/08/1956 No data recorded  Encounter Date: 03/14/2021   PT End of Session - 03/14/21 1646     Visit Number 7    Number of Visits 25    Date for PT Re-Evaluation 04/27/21    Authorization Type Medicare/BCBS    Authorization Time Period Initial Cert= 56/43/3295- 18/84/1660    Progress Note Due on Visit 10    PT Start Time 1609    PT Stop Time 1635    PT Time Calculation (min) 26 min    Equipment Utilized During Treatment Gait belt    Activity Tolerance Patient limited by fatigue    Behavior During Therapy WFL for tasks assessed/performed             Past Medical History:  Diagnosis Date   Depression    History of cardiac cath    History of heart artery stent    Hyperlipidemia    Hypertension    MI (myocardial infarction) ([place])    Renal disorder     Past Surgical History:  Procedure Laterality Date   IR [place] TUBE CHANGE  11/22/2020   IR [place] TUBE CHANGE  11/26/2020   IR [place] GASTRO/COLONIC TUBE PERCUT W/FLUORO  10/14/2020   LEFT HEART CATH AND CORONARY ANGIOGRAPHY N/A 08/19/2020   Procedure: LEFT HEART CATH AND CORONARY ANGIOGRAPHY;  Surgeon: Corey Skains, MD;  Location: [place] [facility];  Service: Cardiovascular;  Laterality: N/A;   LIVER TRANSPLANT      There were no vitals filed for this visit.   Subjective Assessment - 03/15/21 1644     Subjective Patient reports not feeling well at all today and being nauseated. He reports he has been in ED 2 times in past 2 weeks and having difficulty feeling better. He reports he wants to do what he can today as he doesn't want to lose ground on his strengthening.    Patient is accompained by: Family member   [name] (she brought him but did not come back during eval)    Pertinent History Patient is a 65 year old male with recent referral for abnomality of gait and recent liver transplant in November 2021. He has past medical history significant for Liver transplant (02/18/2020), CKD, End stage renal disease- On hemodialyis T/TH/Sat., Anemia, DM with neuropathy, Non-stemi, Coronary artery disease, Feeding tube with poor appetite.    Limitations Standing;Lifting;House hold activities;Walking    How long can you sit comfortably? no issues    How long can you stand comfortably? < 52min    How long can you walk comfortably? < 2 min    Patient Stated Goals I want to return to my normal self- Independent with all mobility, not fall, with improved overall stamina.    Currently in Pain? No/denies              INTERVENTIONS:   *Patient reports very nauseated and not feeling well. He was agreeable to seated LE strengthening exercises.  Therex:  Seated March B LE x 10 reps- VC and Visual cues for height of march. Hip abd with GTB around distal thigh- One LE at a time x 10 reps each. Hip flex/abd up and over cone 2 sets of 5 reps Seated knee ext with ball squeeze - Hold  3-5 sec x 10 reps.   *Patient became more nauseated and requested to lay down on his side. He laid down for approx 5 min and stated feeling better but unable to continue today.                               PT Education - 03/15/21 1645     Education Details Seated Exercise technique    Person(s) Educated Patient    Methods Explanation;Demonstration;Tactile cues;Verbal cues    Comprehension Verbalized understanding;Returned demonstration;Verbal cues required;Tactile cues required;Need further instruction              PT Short Term Goals - 02/02/21 1834       PT SHORT TERM GOAL #1   Title Pt will be independent with HEP in order to improve strength and balance in order to decrease fall risk and improve function at home and work.    Baseline 02/02/2021: Patient  reports limited HEP in place currently from [facility] agency    Time 6    Period Weeks    Status New    Target Date 03/16/21               PT Long Term Goals - 02/02/21 1835       PT LONG TERM GOAL #1   Title Pt will improve FOTO to target score of 50 to display perceived improvements in ability to complete ADL's.    Baseline 02/02/2021= 43    Time 12    Period Weeks    Status New    Target Date 04/27/21      PT LONG TERM GOAL #2   Title Pt will decrease 5TSTS by at least 8 seconds in order to demonstrate clinically significant improvement in LE strength.    Baseline 02/02/2021= 28.22 sec with BUE Support    Time 12    Period Weeks    Status New    Target Date 04/27/21      PT LONG TERM GOAL #3   Title Pt will decrease TUG to below 19 seconds/decrease in order to demonstrate decreased fall risk.    Baseline 02/02/2021= 27.30 sec without AD    Time 12    Period Weeks    Status New    Target Date 04/27/21      PT LONG TERM GOAL #4   Title Pt will increase 6MWT by at least 49m (142ft) in order to demonstrate clinically significant improvement in cardiopulmonary endurance and community ambulation    Baseline 02/02/2021= 83 feet in 1 min 10 sec wihtout an AD    Time 12    Period Weeks    Status New    Target Date 04/27/21      PT LONG TERM GOAL #5   Title Pt will increase 10MWT by at least 0.2 m/s in order to demonstrate clinically significant improvement in community ambulation.    Baseline 02/02/2021= 0.42 m/s    Time 12    Period Weeks    Status New    Target Date 04/27/21                   Plan - 03/15/21 1646     Clinical Impression Statement Treatment was very limited today secondary to patient not feeling well. He arrived feeling nauseated but requested to try to participate as able. He was performing all therex with VC and quick rest breaks however he became  more nauseated. He did lay down for 5 min and reported feeling better but was unable to  continue today. Pt will benefit from skilled PT services to address deficits in balance, mobility and QOL while decreasing risk of future falls    Personal Factors and Comorbidities Comorbidity 3+    Comorbidities DM, Liver transplant, ESRD, CAD    Examination-Activity Limitations Carry;Lift;Reach Overhead;Squat;Stairs;Stand;Transfers;Toileting    Examination-Participation Restrictions Cleaning;Community Activity;Driving;Medication Management;Meal Prep;Yard Work    [employer] Evolving/Moderate complexity    Rehab Potential Fair    PT Frequency 2x / week    PT Duration 12 weeks    PT Treatment/Interventions ADLs/Self Care Home Management;Cryotherapy;Moist Heat;DME Instruction;Gait training;Stair training;Functional mobility training;Therapeutic activities;Therapeutic exercise;Balance training;Neuromuscular re-education;Patient/family education;Wheelchair mobility training;Manual techniques;Passive range of motion;Dry needling;Energy conservation;Joint Manipulations    PT Next Visit Plan Progressive Therapeutic exercises, Balance training, transfer/gait training    PT Home Exercise Plan No changes or updates today.    Consulted and Agree with Plan of Care Patient             Patient will benefit from skilled therapeutic intervention in order to improve the following deficits and impairments:  Abnormal gait, Cardiopulmonary status limiting activity, Decreased activity tolerance, Decreased balance, Decreased coordination, Decreased endurance, Decreased mobility, Decreased range of motion, Decreased strength, Difficulty walking, Hypomobility, Impaired flexibility, Impaired UE functional use  Visit Diagnosis: Abnormality of gait and mobility  Difficulty in walking, not elsewhere classified  Muscle weakness (generalized)  Unsteadiness on feet     Problem List Patient Active Problem List   Diagnosis Date Noted   Pancreatic cyst    Verbal auditory hallucination     Early satiety    Feeding tube dysfunction    Generalized weakness 11/26/2020   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[place]) 11/26/2020   Recurrent falls 11/26/2020   Syncope 10/15/2020   Uremia 10/10/2020   ESRD on hemodialysis ([place]) 10/10/2020   Hyperkalemia 10/10/2020   Elevated troponin 10/10/2020   GERD (gastroesophageal reflux disease) 10/10/2020   CAD (coronary artery disease) 10/10/2020   G tube feedings ([place]) 10/10/2020   Diarrhea 10/10/2020   Type II diabetes mellitus with renal manifestations ([place]) 10/10/2020   Weakness    ACS (acute coronary syndrome) ([place])    Liver transplant recipient [facility])    Anemia of chronic disease    Type 2 diabetes mellitus with diabetic neuropathy, with long-term current use of insulin ([place])    ESRD (end stage renal disease) ([place]) 08/19/2020   History of anemia due to chronic kidney disease 08/19/2020   Gait abnormality 10/17/2019   History of fall within past 90 days 10/17/2019   Visual hallucination 10/17/2019   Cirrhosis of liver with ascites ([place]) on CT 10/17/2019   HTN (hypertension) 10/17/2019   History of MI (myocardial infarction) 10/17/2019   OSA (obstructive sleep apnea) 10/17/2019   Nondisplaced comminuted supracondylar fracture without intercondylar fracture of left humerus, subsequent encounter for fracture with nonunion 10/17/2019   Anxiety and depression 10/17/2019    Lewis Moccasin, PT 03/15/2021, 4:53 PM  [place] MAIN [facility] SERVICES [place], [place], [address] Phone: [phone]   Fax:  [phone]  Name: Alex Hampton MRN: [id number] Date of Birth: 07/04/1955

## 2021-03-16 ENCOUNTER — Other Ambulatory Visit: Payer: Self-pay

## 2021-03-16 ENCOUNTER — Ambulatory Visit: Payer: Medicare Other | Admitting: Physical Therapy

## 2021-03-16 ENCOUNTER — Ambulatory Visit: Payer: Medicare Other

## 2021-03-16 DIAGNOSIS — R2681 Unsteadiness on feet: Secondary | ICD-10-CM

## 2021-03-16 DIAGNOSIS — M6281 Muscle weakness (generalized): Secondary | ICD-10-CM

## 2021-03-16 DIAGNOSIS — R269 Unspecified abnormalities of gait and mobility: Secondary | ICD-10-CM | POA: Diagnosis not present

## 2021-03-16 DIAGNOSIS — R262 Difficulty in walking, not elsewhere classified: Secondary | ICD-10-CM

## 2021-03-16 NOTE — Therapy (Signed)
[place] MAIN [facility] SERVICES [address], [place], [address] Phone: [phone]   Fax:  [phone]  Physical Therapy Treatment  Patient Details  Name: Alex Hampton MRN: [id number] Date of Birth: 07-05-1955 No data recorded  Encounter Date: 03/16/2021   PT End of Session - 03/17/21 1122     Visit Number 8    Number of Visits 25    Date for PT Re-Evaluation 04/27/21    Authorization Type Medicare/BCBS    Authorization Time Period Initial Cert= 24/26/8341- 96/22/2979    Progress Note Due on Visit 10    PT Start Time 1300    PT Stop Time 1335    PT Time Calculation (min) 35 min    Equipment Utilized During Treatment Gait belt    Activity Tolerance Patient limited by fatigue;Other (comment)   limited by nausea   Behavior During Therapy WFL for tasks assessed/performed             Past Medical History:  Diagnosis Date   Depression    History of cardiac cath    History of heart artery stent    Hyperlipidemia    Hypertension    MI (myocardial infarction) ([place])    Renal disorder     Past Surgical History:  Procedure Laterality Date   IR [place] TUBE CHANGE  11/22/2020   IR [place] TUBE CHANGE  11/26/2020   IR [place] GASTRO/COLONIC TUBE PERCUT W/FLUORO  10/14/2020   LEFT HEART CATH AND CORONARY ANGIOGRAPHY N/A 08/19/2020   Procedure: LEFT HEART CATH AND CORONARY ANGIOGRAPHY;  Surgeon: Corey Skains, MD;  Location: [address] CV LAB;  Service: Cardiovascular;  Laterality: N/A;   LIVER TRANSPLANT      There were no vitals filed for this visit.   Subjective Assessment - 03/17/21 1120     Subjective Patient reports feeling better today vs. last visit stating he is having "just a touch of nausea but not bad."    Patient is accompained by: Family member   [name] (she brought him but did not come back during eval)   Pertinent History Patient is a 65 year old male with recent referral for abnomality of gait and recent liver  transplant in November 2021. He has past medical history significant for Liver transplant (02/18/2020), CKD, End stage renal disease- On hemodialyis T/TH/Sat., Anemia, DM with neuropathy, Non-stemi, Coronary artery disease, Feeding tube with poor appetite.    Limitations Standing;Lifting;House hold activities;Walking    How long can you sit comfortably? no issues    How long can you stand comfortably? < 76min    How long can you walk comfortably? < 2 min    Patient Stated Goals I want to return to my normal self- Independent with all mobility, not fall, with improved overall stamina.    Currently in Pain? No/denies              INTERVENTIONS:   Therapeutic Exercises:  -Standing Step tap alternating LE's  x 10 reps each (6"block)  (Cues to try to limit UE support on // bars as able) Patient unable to remove UE from support due to weakness/imbalance.   High knee march using GTB x 10 reps (VC to raise knee to height of // bars). Patient rated activity as "hard."  Side step up and over hedgehog x 2 then back opp direction = 1 (x 8 reps) - Patient stopped secondary to overall fatigue. - No LOB but requires BUE Support.  Standing calf raises x 10 reps. VC to push heels up as high as possible without bending knees.   Sit to stand x 5 reps - Patient required increased UE support- fatigued after reps.  Minisquats x 5 reps- VC for slow muscle control.    Ball toss standing in // bars x 45 sec (CGA with SPT assist with tossing ball to patient) Patient able to catch and throw stating for approximately 45 sec.   Finished with seated therex due to patient fatigued with standing.   Seated hip march with 3lb-  10 rep each LE  Seated knee ext with 3lb (VC for full ROM)  10 reps each LE  Seated ham curls with GTB - 10 reps BLE - Patient able to complete but request to finish due to overall fatigue and being more nauseated at end of treatment.    Education provided throughout session via VC/TC  and demonstration to facilitate movement at target joints and correct muscle activation for all testing and exercises performed.                     PT Education - 03/17/21 1121     Education Details Exercise technique    Person(s) Educated Patient    Methods Explanation;Demonstration;Tactile cues;Verbal cues    Comprehension Verbalized understanding;Returned demonstration;Verbal cues required;Tactile cues required;Need further instruction              PT Short Term Goals - 02/02/21 1834       PT SHORT TERM GOAL #1   Title Pt will be independent with HEP in order to improve strength and balance in order to decrease fall risk and improve function at home and work.    Baseline 02/02/2021: Patient reports limited HEP in place currently from [facility] agency    Time 6    Period Weeks    Status New    Target Date 03/16/21               PT Long Term Goals - 02/02/21 1835       PT LONG TERM GOAL #1   Title Pt will improve FOTO to target score of 50 to display perceived improvements in ability to complete ADL's.    Baseline 02/02/2021= 43    Time 12    Period Weeks    Status New    Target Date 04/27/21      PT LONG TERM GOAL #2   Title Pt will decrease 5TSTS by at least 8 seconds in order to demonstrate clinically significant improvement in LE strength.    Baseline 02/02/2021= 28.22 sec with BUE Support    Time 12    Period Weeks    Status New    Target Date 04/27/21      PT LONG TERM GOAL #3   Title Pt will decrease TUG to below 19 seconds/decrease in order to demonstrate decreased fall risk.    Baseline 02/02/2021= 27.30 sec without AD    Time 12    Period Weeks    Status New    Target Date 04/27/21      PT LONG TERM GOAL #4   Title Pt will increase 6MWT by at least 39m (122ft) in order to demonstrate clinically significant improvement in cardiopulmonary endurance and community ambulation    Baseline 02/02/2021= 83 feet in 1 min 10 sec wihtout an AD     Time 12    Period Weeks    Status New  Target Date 04/27/21      PT LONG TERM GOAL #5   Title Pt will increase 10MWT by at least 0.2 m/s in order to demonstrate clinically significant improvement in community ambulation.    Baseline 02/02/2021= 0.42 m/s    Time 12    Period Weeks    Status New    Target Date 04/27/21                   Plan - 03/16/21 1123     Clinical Impression Statement Patient arrived with excellent motivation despite continuing to not feel well with ongoing nausea. He was able to progress to more standing activities and able to participate better prior to undo fatigue. He continues to be limited with all functional endurance but able to complete a combination of standing and seated therex today. Pt will benefit from skilled PT services to address deficits in balance, mobility and QOL while decreasing risk of future falls    Personal Factors and Comorbidities Comorbidity 3+    Comorbidities DM, Liver transplant, ESRD, CAD    Examination-Activity Limitations Carry;Lift;Reach Overhead;Squat;Stairs;Stand;Transfers;Toileting    Examination-Participation Restrictions Cleaning;Community Activity;Driving;Medication Management;Meal Prep;Yard Work    [employer] officer Evolving/Moderate complexity    Rehab Potential Fair    PT Frequency 2x / week    PT Duration 12 weeks    PT Treatment/Interventions ADLs/Self Care Home Management;Cryotherapy;Moist Heat;DME Instruction;Gait training;Stair training;Functional mobility training;Therapeutic activities;Therapeutic exercise;Balance training;Neuromuscular re-education;Patient/family education;Wheelchair mobility training;Manual techniques;Passive range of motion;Dry needling;Energy conservation;Joint Manipulations    PT Next Visit Plan Progressive Therapeutic exercises, Balance training, transfer/gait training    PT Home Exercise Plan No changes or updates today.    Consulted and Agree with Plan of Care  Patient             Patient will benefit from skilled therapeutic intervention in order to improve the following deficits and impairments:  Abnormal gait, Cardiopulmonary status limiting activity, Decreased activity tolerance, Decreased balance, Decreased coordination, Decreased endurance, Decreased mobility, Decreased range of motion, Decreased strength, Difficulty walking, Hypomobility, Impaired flexibility, Impaired UE functional use  Visit Diagnosis: Abnormality of gait and mobility  Difficulty in walking, not elsewhere classified  Muscle weakness (generalized)  Unsteadiness on feet     Problem List Patient Active Problem List   Diagnosis Date Noted   Pancreatic cyst    Verbal auditory hallucination    Early satiety    Feeding tube dysfunction    Generalized weakness 11/26/2020   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[place]) 11/26/2020   Recurrent falls 11/26/2020   Syncope 10/15/2020   Uremia 10/10/2020   ESRD on hemodialysis ([place]) 10/10/2020   Hyperkalemia 10/10/2020   Elevated troponin 10/10/2020   GERD (gastroesophageal reflux disease) 10/10/2020   CAD (coronary artery disease) 10/10/2020   G tube feedings ([place]) 10/10/2020   Diarrhea 10/10/2020   Type II diabetes mellitus with renal manifestations ([place]) 10/10/2020   Weakness    ACS (acute coronary syndrome) ([place])    Liver transplant recipient [facility])    Anemia of chronic disease    Type 2 diabetes mellitus with diabetic neuropathy, with long-term current use of insulin ([place])    ESRD (end stage renal disease) ([place]) 08/19/2020   History of anemia due to chronic kidney disease 08/19/2020   Gait abnormality 10/17/2019   History of fall within past 90 days 10/17/2019   Visual hallucination 10/17/2019   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

## 2021-03-21 ENCOUNTER — Other Ambulatory Visit: Payer: Self-pay

## 2021-03-21 ENCOUNTER — Ambulatory Visit: Payer: Medicare Other

## 2021-03-21 DIAGNOSIS — R269 Unspecified abnormalities of gait and mobility: Secondary | ICD-10-CM

## 2021-03-21 DIAGNOSIS — R262 Difficulty in walking, not elsewhere classified: Secondary | ICD-10-CM

## 2021-03-21 DIAGNOSIS — R296 Repeated falls: Secondary | ICD-10-CM

## 2021-03-21 DIAGNOSIS — R2681 Unsteadiness on feet: Secondary | ICD-10-CM

## 2021-03-21 DIAGNOSIS — R278 Other lack of coordination: Secondary | ICD-10-CM

## 2021-03-21 DIAGNOSIS — R2689 Other abnormalities of gait and mobility: Secondary | ICD-10-CM

## 2021-03-21 DIAGNOSIS — M6281 Muscle weakness (generalized): Secondary | ICD-10-CM

## 2021-03-21 NOTE — Therapy (Signed)
[place] MAIN [facility] SERVICES [address] [place], [place], [address] Phone: [phone]   Fax:  [phone]  Physical Therapy Treatment  Patient Details  Name: Alex Hampton MRN: [id number] Date of Birth: 03-17-1956 No data recorded  Encounter Date: 03/21/2021   PT End of Session - 03/21/21 1459     Visit Number 9    Number of Visits 25    Date for PT Re-Evaluation 04/27/21    Authorization Type Medicare/BCBS    Authorization Time Period Initial Cert= 29/51/8841- 66/09/3014    Progress Note Due on Visit 10    PT Start Time 0109    PT Stop Time 1515    PT Time Calculation (min) 38 min    Equipment Utilized During Treatment Gait belt    Activity Tolerance Patient limited by fatigue;Other (comment)    Behavior During Therapy WFL for tasks assessed/performed             Past Medical History:  Diagnosis Date   Depression    History of cardiac cath    History of heart artery stent    Hyperlipidemia    Hypertension    MI (myocardial infarction) ([place])    Renal disorder     Past Surgical History:  Procedure Laterality Date   IR [place] TUBE CHANGE  11/22/2020   IR [place] TUBE CHANGE  11/26/2020   IR [place] GASTRO/COLONIC TUBE PERCUT W/FLUORO  10/14/2020   LEFT HEART CATH AND CORONARY ANGIOGRAPHY N/A 08/19/2020   Procedure: LEFT HEART CATH AND CORONARY ANGIOGRAPHY;  Surgeon: Corey Skains, MD;  Location: [place] CV LAB;  Service: Cardiovascular;  Laterality: N/A;   LIVER TRANSPLANT      There were no vitals filed for this visit.   Subjective Assessment - 03/21/21 1455     Subjective Pt doing well in general. Chest pain has resolved. No other updates.    Pertinent History Patient is a 65 year old male with recent referral for abnomality of gait and recent liver transplant in November 2021. He has past medical history significant for Liver transplant (02/18/2020), CKD, End stage renal disease- On hemodialyis T/TH/Sat., Anemia, DM  with neuropathy, Non-stemi, Coronary artery disease, Feeding tube with poor appetite.             INTERVENTION:   -Standing Step tap alternating LE x 10 reps each (6"block)    (minGuard assist) -High knee march using GTB x 10 reps (VC to raise knee to height of // bars). Patient rated activity as "hard." -Side step up and over half foam roll x10 bilat, single UE support (supervision level)   -Standing calf raises x 10 reps. VC to push heels up as high as possible without bending knees.  -seated resisted ankle DF x10, 3lb AW bilat  -STS from chair + airex x10, ad lib BUE on chair at low portion  -STS from chair + 2airex, hands free x5   BP assessment:  Seated: 109/85mmHg 70bpm  Standing: 83/41mmHg 78bpm  Standing at 1 minutes (unable)    PT Education - 03/21/21 1705     Education Details PRESENTATION OF orthostatic hypotension and potential utility of medication therapy    Person(s) Educated Patient    Methods Explanation;Demonstration    Comprehension Verbalized understanding              PT Short Term Goals - 02/02/21 1834       PT SHORT TERM GOAL #1   Title Pt will  be independent with HEP in order to improve strength and balance in order to decrease fall risk and improve function at home and work.    Baseline 02/02/2021: Patient reports limited HEP in place currently from [facility] agency    Time 6    Period Weeks    Status New    Target Date 03/16/21               PT Long Term Goals - 02/02/21 1835       PT LONG TERM GOAL #1   Title Pt will improve FOTO to target score of 50 to display perceived improvements in ability to complete ADL's.    Baseline 02/02/2021= 43    Time 12    Period Weeks    Status New    Target Date 04/27/21      PT LONG TERM GOAL #2   Title Pt will decrease 5TSTS by at least 8 seconds in order to demonstrate clinically significant improvement in LE strength.    Baseline 02/02/2021= 28.22 sec with BUE Support    Time 12    Period  Weeks    Status New    Target Date 04/27/21      PT LONG TERM GOAL #3   Title Pt will decrease TUG to below 19 seconds/decrease in order to demonstrate decreased fall risk.    Baseline 02/02/2021= 27.30 sec without AD    Time 12    Period Weeks    Status New    Target Date 04/27/21      PT LONG TERM GOAL #4   Title Pt will increase 6MWT by at least 38m (174ft) in order to demonstrate clinically significant improvement in cardiopulmonary endurance and community ambulation    Baseline 02/02/2021= 83 feet in 1 min 10 sec wihtout an AD    Time 12    Period Weeks    Status New    Target Date 04/27/21      PT LONG TERM GOAL #5   Title Pt will increase 10MWT by at least 0.2 m/s in order to demonstrate clinically significant improvement in community ambulation.    Baseline 02/02/2021= 0.42 m/s    Time 12    Period Weeks    Status New    Target Date 04/27/21                   Plan - 03/21/21 1501     Clinical Impression Statement Continued with basic strengthing and motor control in stance. Pt continues to have consistent temporal limitations to uprght, regardless of activity, very common in chronic orthostatic hypotension. BP screening at end of session revealing of orthostatic hypotension with severe orthostatic inactivity. Educated patient. Drops to 83/52 with first reading, pt unable to tolerate standing long enough to even start a second BP. Pt agreeable for PT to reach out to nephrology regarding any potential changes to midodrine therapies.    Personal Factors and Comorbidities Comorbidity 3+    Comorbidities DM, Liver transplant, ESRD, CAD    Examination-Activity Limitations Carry;Lift;Reach Overhead;Squat;Stairs;Stand;Transfers;Toileting    Examination-Participation Restrictions Cleaning;Community Activity;Driving;Medication Management;Meal Prep;Yard Work    [employer] officer Evolving/Moderate complexity    Clinical Decision Making Moderate    Rehab  Potential Fair    PT Frequency 2x / week    PT Duration 12 weeks    PT Treatment/Interventions ADLs/Self Care Home Management;Cryotherapy;Moist Heat;DME Instruction;Gait training;Stair training;Functional mobility training;Therapeutic activities;Therapeutic exercise;Balance training;Neuromuscular re-education;Patient/family education;Wheelchair mobility training;Manual techniques;Passive range of motion;Dry needling;Energy conservation;Joint  Manipulations    PT Next Visit Plan Progressive Therapeutic exercises, Balance training, transfer/gait training    PT Home Exercise Plan No changes or updates today.    Consulted and Agree with Plan of Care Patient             Patient will benefit from skilled therapeutic intervention in order to improve the following deficits and impairments:  Abnormal gait, Cardiopulmonary status limiting activity, Decreased activity tolerance, Decreased balance, Decreased coordination, Decreased endurance, Decreased mobility, Decreased range of motion, Decreased strength, Difficulty walking, Hypomobility, Impaired flexibility, Impaired UE functional use  Visit Diagnosis: Abnormality of gait and mobility  Difficulty in walking, not elsewhere classified  Muscle weakness (generalized)  Unsteadiness on feet  Other lack of coordination  Other abnormalities of gait and mobility  Repeated falls     Problem List Patient Active Problem List   Diagnosis Date Noted   Pancreatic cyst    Verbal auditory hallucination    Early satiety    Feeding tube dysfunction    Generalized weakness 11/26/2020   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[place]) 11/26/2020   Recurrent falls 11/26/2020   Syncope 10/15/2020   Uremia 10/10/2020   ESRD on hemodialysis ([facility]) 10/10/2020   Hyperkalemia 10/10/2020   Elevated troponin 10/10/2020   GERD (gastroesophageal reflux disease) 10/10/2020   CAD (coronary artery disease) 10/10/2020   G tube feedings ([place]) 10/10/2020   Diarrhea  10/10/2020   Type II diabetes mellitus with renal manifestations ([place]) 10/10/2020   Weakness    ACS (acute coronary syndrome) ([place])    Liver transplant recipient [facility])    Anemia of chronic disease    Type 2 diabetes mellitus with diabetic neuropathy, with long-term current use of insulin ([place])    ESRD (end stage renal disease) ([place]) 08/19/2020   History of anemia due to chronic kidney disease 08/19/2020   Gait abnormality 10/17/2019   History of fall within past 90 days 10/17/2019   Visual hallucination 10/17/2019   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 10/17/2019   HTN (hypertension) 10/17/2019   History of MI (myocardial infarction) 10/17/2019   OSA (obstructive sleep apnea) 10/17/2019   Nondisplaced comminuted supracondylar fracture without intercondylar fracture of left humerus, subsequent encounter for fracture with nonunion 10/17/2019   Anxiety and depression 10/17/2019   5:09 PM, 03/21/21 Etta Grandchild, PT, DPT Physical Therapist - [facility]  Outpatient Physical [place] [phone]     [place], PT 03/21/2021, 5:08 PM  [place] MAIN [facility] SERVICES [address] [place], [place], [address] Phone: [phone]   Fax:  [phone]  Name: Alex Hampton MRN: [id number] Date of Birth: May 26, 1955

## 2021-03-23 ENCOUNTER — Other Ambulatory Visit: Payer: Self-pay

## 2021-03-23 ENCOUNTER — Ambulatory Visit: Payer: Medicare Other

## 2021-03-23 DIAGNOSIS — R2681 Unsteadiness on feet: Secondary | ICD-10-CM

## 2021-03-23 DIAGNOSIS — R269 Unspecified abnormalities of gait and mobility: Secondary | ICD-10-CM

## 2021-03-23 DIAGNOSIS — R262 Difficulty in walking, not elsewhere classified: Secondary | ICD-10-CM

## 2021-03-23 DIAGNOSIS — M6281 Muscle weakness (generalized): Secondary | ICD-10-CM

## 2021-03-23 NOTE — Therapy (Signed)
[place] MAIN [facility] SERVICES [address] [place], [place], [address] Phone: [phone]   Fax:  [phone]  Physical Therapy Treatment Physical Therapy Progress Note   Dates of reporting period  02/02/21   to   03/23/21   Patient Details  Name: Alex Hampton MRN: [id number] Date of Birth: May 28, 1955 No data recorded  Encounter Date: 03/23/2021   PT End of Session - 03/23/21 1646     Visit Number 10    Number of Visits 25    Date for PT Re-Evaluation 04/27/21    Authorization Type Medicare/BCBS; PN 03/23/21    Authorization Time Period Initial Cert= 60/73/7106- 26/94/8546    Progress Note Due on Visit 10    PT Start Time 1600    PT Stop Time 1639    PT Time Calculation (min) 39 min    Equipment Utilized During Treatment Gait belt    Activity Tolerance Patient limited by fatigue;Other (comment)    Behavior During Therapy WFL for tasks assessed/performed             Past Medical History:  Diagnosis Date   Depression    History of cardiac cath    History of heart artery stent    Hyperlipidemia    Hypertension    MI (myocardial infarction) ([place])    Renal disorder     Past Surgical History:  Procedure Laterality Date   IR [place] TUBE CHANGE  11/22/2020   IR [place] TUBE CHANGE  11/26/2020   IR [place] GASTRO/COLONIC TUBE PERCUT W/FLUORO  10/14/2020   LEFT HEART CATH AND CORONARY ANGIOGRAPHY N/A 08/19/2020   Procedure: LEFT HEART CATH AND CORONARY ANGIOGRAPHY;  Surgeon: Corey Skains, MD;  Location: [place] [facility];  Service: Cardiovascular;  Laterality: N/A;   LIVER TRANSPLANT      There were no vitals filed for this visit.   Subjective Assessment - 03/23/21 1647     Subjective Patient reports he is doing well today, his nausea and fatigue is present but manageable.    Pertinent History Patient is a 65 year old male with recent referral for abnomality of gait and recent liver transplant in November 2021. He has past medical  history significant for Liver transplant (02/18/2020), CKD, End stage renal disease- On hemodialyis T/TH/Sat., Anemia, DM with neuropathy, Non-stemi, Coronary artery disease, Feeding tube with poor appetite.    Limitations Standing;Lifting;House hold activities;Walking    How long can you sit comfortably? no issues    How long can you stand comfortably? < 6mn    How long can you walk comfortably? < 2 min    Patient Stated Goals I want to return to my normal self- Independent with all mobility, not fall, with improved overall stamina.    Currently in Pain? No/denies                   Goals:  FOTO: 51 %  5x STS:  23.1 seconds with BUE support  TUG 23 seconds  6 MWT: 200 ft: 3 minutes: BP at walk: 124/78 10 MWT first attempt 21.5 seconds, second attempt 20.1 seconds =0.49 m/s     Treatment:  GTB seated: -hamstring curl 15x each LE -adduction 15x each LE -single limb abduction 15x each LE  -band around bilateral ankles LAQ 10x each LE  Patient's condition has the potential to improve in response to therapy. Maximum improvement is yet to be obtained. The anticipated improvement is attainable and reasonable in a  generally predictable time.   Patient demonstrates excellent progression towards functional goals at this time. He is able to ambulate more than double his initial duration during his six minute walk test. Patient does fatigue and requires intermittent rest breaks.His gait speed has improved as well as his ability to transfer.  Patient's condition has the potential to improve in response to therapy. Maximum improvement is yet to be obtained. The anticipated improvement is attainable and reasonable in a generally predictable time. Pt will benefit from skilled PT services to address deficits in balance, mobility and QOL while decreasing risk of future falls              PT Education - 03/23/21 1646     Education Details goals, strengthening technique     Person(s) Educated Patient    Methods Explanation;Demonstration;Tactile cues;Verbal cues    Comprehension Verbalized understanding;Returned demonstration;Verbal cues required;Tactile cues required              PT Short Term Goals - 03/23/21 1645       PT SHORT TERM GOAL #1   Title Pt will be independent with HEP in order to improve strength and balance in order to decrease fall risk and improve function at home and work.    Baseline 02/02/2021: Patient reports limited HEP in place currently from [facility] agency 12/14 compliance    Time 6    Period Weeks    Status Partially Met    Target Date 03/16/21               PT Long Term Goals - 03/23/21 1609       PT LONG TERM GOAL #1   Title Pt will improve FOTO to target score of 50 to display perceived improvements in ability to complete ADL's.    Baseline 02/02/2021= 43 12/143: 51%    Time 12    Period Weeks    Status Achieved    Target Date 04/27/21      PT LONG TERM GOAL #2   Title Pt will decrease 5TSTS by at least 8 seconds in order to demonstrate clinically significant improvement in LE strength.    Baseline 02/02/2021= 28.22 sec with BUE Support 12/14: 23.1 seconds    Time 12    Period Weeks    Status Partially Met    Target Date 04/27/21      PT LONG TERM GOAL #3   Title Pt will decrease TUG to below 19 seconds/decrease in order to demonstrate decreased fall risk.    Baseline 02/02/2021= 27.30 sec without AD 12/14: 23 seconds    Time 12    Period Weeks    Status Partially Met    Target Date 04/27/21      PT LONG TERM GOAL #4   Title Pt will increase 6MWT by at least 48m(1671f in order to demonstrate clinically significant improvement in cardiopulmonary endurance and community ambulation    Baseline 02/02/2021= 83 feet in 1 min 10 sec wihtout an AD 12/14: 200 ft in  9m41mtes    Time 12    Period Weeks    Status Partially Met    Target Date 04/27/21      PT LONG TERM GOAL #5   Title Pt will increase 10MWT by  at least 0.2 m/s in order to demonstrate clinically significant improvement in community ambulation.    Baseline 02/02/2021= 0.42 m/s 12/14: 0.49 m/s    Time 12    Period Weeks    Status  Partially Met    Target Date 04/27/21                   Plan - 03/23/21 1645     Clinical Impression Statement Patient demonstrates excellent progression towards functional goals at this time. He is able to ambulate more than double his initial duration during his six minute walk test. Patient does fatigue and requires intermittent rest breaks.His gait speed has improved as well as his ability to transfer.  Patient's condition has the potential to improve in response to therapy. Maximum improvement is yet to be obtained. The anticipated improvement is attainable and reasonable in a generally predictable time. Pt will benefit from skilled PT services to address deficits in balance, mobility and QOL while decreasing risk of future falls    Personal Factors and Comorbidities Comorbidity 3+    Comorbidities DM, Liver transplant, ESRD, CAD    Examination-Activity Limitations Carry;Lift;Reach Overhead;Squat;Stairs;Stand;Transfers;Toileting    Examination-Participation Restrictions Cleaning;Community Activity;Driving;Medication Management;Meal Prep;Yard Work    [employer]chant [employer] officer Evolving/Moderate complexity    Rehab Potential Fair    PT Frequency 2x / week    PT Duration 12 weeks    PT Treatment/Interventions ADLs/Self Care Home Management;Cryotherapy;Moist Heat;DME Instruction;Gait training;Stair training;Functional mobility training;Therapeutic activities;Therapeutic exercise;Balance training;Neuromuscular re-education;Patient/family education;Wheelchair mobility training;Manual techniques;Passive range of motion;Dry needling;Energy conservation;Joint Manipulations    PT Next Visit Plan Progressive Therapeutic exercises, Balance training, transfer/gait training    PT Home Exercise Plan No  changes or updates today.    Consulted and Agree with Plan of Care Patient             Patient will benefit from skilled therapeutic intervention in order to improve the following deficits and impairments:  Abnormal gait, Cardiopulmonary status limiting activity, Decreased activity tolerance, Decreased balance, Decreased coordination, Decreased endurance, Decreased mobility, Decreased range of motion, Decreased strength, Difficulty walking, Hypomobility, Impaired flexibility, Impaired UE functional use  Visit Diagnosis: Abnormality of gait and mobility  Difficulty in walking, not elsewhere classified  Muscle weakness (generalized)  Unsteadiness on feet     Problem List Patient Active Problem List   Diagnosis Date Noted   Pancreatic cyst    Verbal auditory hallucination    Early satiety    Feeding tube dysfunction    Generalized weakness 11/26/2020   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[place]) 11/26/2020   Recurrent falls 11/26/2020   Syncope 10/15/2020   Uremia 10/10/2020   ESRD on hemodialysis ([place]) 10/10/2020   Hyperkalemia 10/10/2020   Elevated troponin 10/10/2020   GERD (gastroesophageal reflux disease) 10/10/2020   CAD (coronary artery disease) 10/10/2020   G tube feedings ([place]) 10/10/2020   Diarrhea 10/10/2020   Type II diabetes mellitus with renal manifestations ([place]) 10/10/2020   Weakness    ACS (acute coronary syndrome) ([place])    Liver transplant recipient [facility])    Anemia of chronic disease    Type 2 diabetes mellitus with diabetic neuropathy, with long-term current use of insulin ([place])    ESRD (end stage renal disease) ([place]) 08/19/2020   History of anemia due to chronic kidney disease 08/19/2020   Gait abnormality 10/17/2019   History of fall within past 90 days 10/17/2019   Visual hallucination 10/17/2019   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

## 2021-03-30 ENCOUNTER — Ambulatory Visit: Payer: Medicare Other

## 2021-03-30 ENCOUNTER — Other Ambulatory Visit: Payer: Self-pay

## 2021-03-30 DIAGNOSIS — R269 Unspecified abnormalities of gait and mobility: Secondary | ICD-10-CM | POA: Diagnosis not present

## 2021-03-30 DIAGNOSIS — M6281 Muscle weakness (generalized): Secondary | ICD-10-CM

## 2021-03-30 DIAGNOSIS — R2681 Unsteadiness on feet: Secondary | ICD-10-CM

## 2021-03-30 DIAGNOSIS — R2689 Other abnormalities of gait and mobility: Secondary | ICD-10-CM

## 2021-03-30 NOTE — Therapy (Signed)
[place] MAIN [facility] SERVICES [address] [place], [place], [address] Phone: [phone]   Fax:  [phone]  Physical Therapy Treatment  Patient Details  Name: Alex Hampton MRN: [id number] Date of Birth: 1955-12-13 No data recorded  Encounter Date: 03/30/2021   PT End of Session - 03/30/21 1654     Visit Number 11    Number of Visits 25    Date for PT Re-Evaluation 04/27/21    Authorization Type Medicare/BCBS; PN 03/23/21    Authorization Time Period Initial Cert= 25/36/6440- 34/74/2595    Progress Note Due on Visit 10    PT Start Time 1601    PT Stop Time 1642    PT Time Calculation (min) 41 min    Equipment Utilized During Treatment Gait belt    Activity Tolerance Patient limited by fatigue;Patient tolerated treatment well    Behavior During Therapy WFL for tasks assessed/performed             Past Medical History:  Diagnosis Date   Depression    History of cardiac cath    History of heart artery stent    Hyperlipidemia    Hypertension    MI (myocardial infarction) ([place])    Renal disorder     Past Surgical History:  Procedure Laterality Date   IR [facility] TUBE CHANGE  11/22/2020   IR [place] TUBE CHANGE  11/26/2020   IR [place] GASTRO/COLONIC TUBE PERCUT W/FLUORO  10/14/2020   LEFT HEART CATH AND CORONARY ANGIOGRAPHY N/A 08/19/2020   Procedure: LEFT HEART CATH AND CORONARY ANGIOGRAPHY;  Surgeon: Corey Skains, MD;  Location: [place] [facility];  Service: Cardiovascular;  Laterality: N/A;   LIVER TRANSPLANT      There were no vitals filed for this visit.     Subjective Assessment - 03/30/21 1600     Subjective Pt reports his stomach is a bit upset currently. He reports no pain. Pt reports no stumbles or falls/LOB. Pt reports he has been doing laps in his apartment for exercise. He can walk for up to 2 minutes.    Patient is accompained by: Family member   Dtr- Alex Hampton (she brought him but did not come back during eval)    Pertinent History Patient is a 65 year old male with recent referral for abnomality of gait and recent liver transplant in November 2021. He has past medical history significant for Liver transplant (02/18/2020), CKD, End stage renal disease- On hemodialyis T/TH/Sat., Anemia, DM with neuropathy, Non-stemi, Coronary artery disease, Feeding tube with poor appetite.    Limitations Standing;Lifting;House hold activities;Walking    How long can you sit comfortably? no issues    How long can you stand comfortably? < 45mn    How long can you walk comfortably? < 2 min    Patient Stated Goals I want to return to my normal self- Independent with all mobility, not fall, with improved overall stamina.    Currently in Pain? No/denies            INTERVENTIONS  Treatment:   Therapeutic exercises:    Nustep Interval -1 min at L0 -1 min at L1 -1 min at L0 -1 min at L2 - attempt 1 min at L0 ended at 4 min 46 sec due to fatigue Total distance = 0.16 mi Pt rates level 1 as medium +  Hamstring curl at M[employer]ions [employer] Corporationmachine - 2.5# 2x10 each LE   STS 10x with BUEs used to assist- pt  rates medium Attempts 3x hands free with cuing for technique, but pt is unable to complete reps Pt then performs 1x5 with UUE assist; pt reports this intervention is approaching hard.  Seated adductor squeezes with ball 3x10 BLEs  Pt rates as medium  Seated LAQ with 3# ankle weights 1x5, 2x10 each LE   Ambulation with 4WW, CGA for LE and cardiorespiratory endurance 2x198 ft, 1x60 ft. Pt rates as medium and takes rest intervals between sets with pt monitored for exercise response throughout. The pt does report some increase in nausea.    Pt educated throughout session about proper posture and technique with exercises. Improved exercise technique, movement at target joints, use of target muscles after min to mod verbal, visual, tactile cues.     PT Short Term Goals - 03/23/21 1645       PT SHORT TERM GOAL #1    Title Pt will be independent with HEP in order to improve strength and balance in order to decrease fall risk and improve function at home and work.    Baseline 02/02/2021: Patient reports limited HEP in place currently from [facility] agency 12/14 compliance    Time 6    Period Weeks    Status Partially Met    Target Date 03/16/21               PT Long Term Goals - 03/23/21 1609       PT LONG TERM GOAL #1   Title Pt will improve FOTO to target score of 50 to display perceived improvements in ability to complete ADL's.    Baseline 02/02/2021= 43 12/143: 51%    Time 12    Period Weeks    Status Achieved    Target Date 04/27/21      PT LONG TERM GOAL #2   Title Pt will decrease 5TSTS by at least 8 seconds in order to demonstrate clinically significant improvement in LE strength.    Baseline 02/02/2021= 28.22 sec with BUE Support 12/14: 23.1 seconds    Time 12    Period Weeks    Status Partially Met    Target Date 04/27/21      PT LONG TERM GOAL #3   Title Pt will decrease TUG to below 19 seconds/decrease in order to demonstrate decreased fall risk.    Baseline 02/02/2021= 27.30 sec without AD 12/14: 23 seconds    Time 12    Period Weeks    Status Partially Met    Target Date 04/27/21      PT LONG TERM GOAL #4   Title Pt will increase 6MWT by at least 5m(1692f in order to demonstrate clinically significant improvement in cardiopulmonary endurance and community ambulation    Baseline 02/02/2021= 83 feet in 1 min 10 sec wihtout an AD 12/14: 200 ft in  79m55mtes    Time 12    Period Weeks    Status Partially Met    Target Date 04/27/21      PT LONG TERM GOAL #5   Title Pt will increase 10MWT by at least 0.2 m/s in order to demonstrate clinically significant improvement in community ambulation.    Baseline 02/02/2021= 0.42 m/s 12/14: 0.49 m/s    Time 12    Period Weeks    Status Partially Met    Target Date 04/27/21                   Plan - 03/30/21 1655      Clinical  Impression Statement Pt highly motivated to participate in session. While pt was limited in interventions somewhat due to fatigue, he did perform several minutes of exercises that promote cardiovascular and LE endurance. Pt was able to ambulate a total of 456 ft with rest intervals taken throughout. The pt will continue to benefit from further skilled PT services to improve strength, mobility, gait and balance to increase QOL and decrease fall risk.    Personal Factors and Comorbidities Comorbidity 3+    Comorbidities DM, Liver transplant, ESRD, CAD    Examination-Activity Limitations Carry;Lift;Reach Overhead;Squat;Stairs;Stand;Transfers;Toileting    Examination-Participation Restrictions Cleaning;Community Activity;Driving;Medication Management;Meal Prep;Yard Work    Merchant [employer] officer Evolving/Moderate complexity    Rehab Potential Fair    PT Frequency 2x / week    PT Duration 12 weeks    PT Treatment/Interventions ADLs/Self Care Home Management;Cryotherapy;Moist Heat;DME Instruction;Gait training;Stair training;Functional mobility training;Therapeutic activities;Therapeutic exercise;Balance training;Neuromuscular re-education;Patient/family education;Wheelchair mobility training;Manual techniques;Passive range of motion;Dry needling;Energy conservation;Joint Manipulations    PT Next Visit Plan Progressive Therapeutic exercises, Balance training, transfer/gait training    PT Home Exercise Plan No changes or updates today.    Consulted and Agree with Plan of Care Patient             Patient will benefit from skilled therapeutic intervention in order to improve the following deficits and impairments:  Abnormal gait, Cardiopulmonary status limiting activity, Decreased activity tolerance, Decreased balance, Decreased coordination, Decreased endurance, Decreased mobility, Decreased range of motion, Decreased strength, Difficulty walking, Hypomobility, Impaired flexibility,  Impaired UE functional use  Visit Diagnosis: Muscle weakness (generalized)  Unsteadiness on feet  Other abnormalities of gait and mobility     Problem List Patient Active Problem List   Diagnosis Date Noted   Pancreatic cyst    Verbal auditory hallucination    Early satiety    Feeding tube dysfunction    Generalized weakness 11/26/2020   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[place]) 11/26/2020   Recurrent falls 11/26/2020   Syncope 10/15/2020   Uremia 10/10/2020   ESRD on hemodialysis ([place]) 10/10/2020   Hyperkalemia 10/10/2020   Elevated troponin 10/10/2020   GERD (gastroesophageal reflux disease) 10/10/2020   CAD (coronary artery disease) 10/10/2020   G tube feedings ([place]) 10/10/2020   Diarrhea 10/10/2020   Type II diabetes mellitus with renal manifestations ([place]) 10/10/2020   Weakness    ACS (acute coronary syndrome) ([place])    Liver transplant recipient [facility])    Anemia of chronic disease    Type 2 diabetes mellitus with diabetic neuropathy, with long-term current use of insulin ([place])    ESRD (end stage renal disease) ([place]) 08/19/2020   History of anemia due to chronic kidney disease 08/19/2020   Gait abnormality 10/17/2019   History of fall within past 90 days 10/17/2019   Visual hallucination 10/17/2019   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

## 2021-04-06 ENCOUNTER — Ambulatory Visit: Payer: Medicare Other | Admitting: Physical Therapy

## 2021-04-12 ENCOUNTER — Ambulatory Visit: Payer: Medicare Other

## 2021-04-13 ENCOUNTER — Ambulatory Visit: Payer: Medicare Other | Attending: Gastroenterology | Admitting: Physical Therapy

## 2021-04-13 ENCOUNTER — Encounter: Payer: Self-pay | Admitting: Physical Therapy

## 2021-04-13 ENCOUNTER — Other Ambulatory Visit: Payer: Self-pay

## 2021-04-13 DIAGNOSIS — R2681 Unsteadiness on feet: Secondary | ICD-10-CM

## 2021-04-13 DIAGNOSIS — R2689 Other abnormalities of gait and mobility: Secondary | ICD-10-CM | POA: Diagnosis present

## 2021-04-13 DIAGNOSIS — R296 Repeated falls: Secondary | ICD-10-CM

## 2021-04-13 DIAGNOSIS — R262 Difficulty in walking, not elsewhere classified: Secondary | ICD-10-CM | POA: Diagnosis present

## 2021-04-13 DIAGNOSIS — R278 Other lack of coordination: Secondary | ICD-10-CM | POA: Diagnosis present

## 2021-04-13 DIAGNOSIS — M6281 Muscle weakness (generalized): Secondary | ICD-10-CM | POA: Diagnosis not present

## 2021-04-13 DIAGNOSIS — R269 Unspecified abnormalities of gait and mobility: Secondary | ICD-10-CM | POA: Diagnosis present

## 2021-04-13 NOTE — Therapy (Signed)
[place] MAIN [facility] SERVICES [address] [place], [place], [address] Phone: [phone]   Fax:  [phone]  Physical Therapy Treatment  Patient Details  Name: Alex Hampton MRN: [id number] Date of Birth: Aug 09, 1955 No data recorded  Encounter Date: 04/13/2021   PT End of Session - 04/13/21 1301     Visit Number 12    Number of Visits 25    Date for PT Re-Evaluation 04/27/21    Authorization Type Medicare/BCBS; PN 03/23/21    Authorization Time Period Initial Cert= 93/23/5573- 22/05/5425    Progress Note Due on Visit 10    PT Start Time 1301    PT Stop Time 1343    PT Time Calculation (min) 42 min    Equipment Utilized During Treatment Gait belt    Activity Tolerance Patient limited by fatigue;Patient tolerated treatment well    Behavior During Therapy WFL for tasks assessed/performed             Past Medical History:  Diagnosis Date   Depression    History of cardiac cath    History of heart artery stent    Hyperlipidemia    Hypertension    MI (myocardial infarction) ([place])    Renal disorder     Past Surgical History:  Procedure Laterality Date   IR [place] TUBE CHANGE  11/22/2020   IR [place] TUBE CHANGE  11/26/2020   IR [place] GASTRO/COLONIC TUBE PERCUT W/FLUORO  10/14/2020   LEFT HEART CATH AND CORONARY ANGIOGRAPHY N/A 08/19/2020   Procedure: LEFT HEART CATH AND CORONARY ANGIOGRAPHY;  Surgeon: Corey Skains, MD;  Location: [place] [facility];  Service: Cardiovascular;  Laterality: N/A;   LIVER TRANSPLANT      There were no vitals filed for this visit.   Subjective Assessment - 04/13/21 1304     Subjective Patient reports doing okay. He reports some fatigue and states his stomach has been bothering him but that is chronic and typical. Denies any new falls or stumbles; Denies any pain;    Patient is accompained by: Family member   [name] (she brought him but did not come back during eval)   Pertinent History Patient is a  66 year old male with recent referral for abnomality of gait and recent liver transplant in November 2021. He has past medical history significant for Liver transplant (02/18/2020), CKD, End stage renal disease- On hemodialyis T/TH/Sat., Anemia, DM with neuropathy, Non-stemi, Coronary artery disease, Feeding tube with poor appetite.    Limitations Standing;Lifting;House hold activities;Walking    How long can you sit comfortably? no issues    How long can you stand comfortably? < 88mn    How long can you walk comfortably? < 2 min    Patient Stated Goals I want to return to my normal self- Independent with all mobility, not fall, with improved overall stamina.    Currently in Pain? No/denies    Multiple Pain Sites No              INTERVENTIONS   Treatment:   Therapeutic exercises:    Nustep Interval, seat #13, arm #11 (intermittent UE use) -1 min at L0 -1 min at L1 -1 min at L0 -1 min at L2- rates difficulty at Medium to medium + -1 min at L0 Completed 5 min total, total distance: 0.15 miles  Instructed patient in HEP: Seated with red tband around BLE -hip abduction/ER x15 reps -Hip flexion march x15 reps- rates as medium -  LAQ x10 reps each LE- moderate difficulty, increased fatigue -heel/toe raises x10 reps  Instructed patient in standing exercise for days when he feels stronger: Red tband tied around BLE: -hip abduction x5 reps each -hip extension x5 reps each Educated patient in side stepping with counter use Educated patient in Standing heel raises with counter use -mini squat with BUE rail assist x3 reps, good form and technique;   Patient required min-moderate verbal/tactile cues for correct exercise technique.Provided written HEP for better adherence. Patient educated in packet of exercise to do when feeling weak (bad) days and packet of exercise to do when feeling strong (good) days;    Ambulation with 4WW, CGA for LE and cardiorespiratory endurance 2x140 ft,  Required cues to increase erect posture and increase step length for better gait safety; Reports some fatigue but denies any pain; Required  short seated rest break after walking; Vitals: SPo2 92%, HR 71   Pt educated throughout session about proper posture and technique with exercises. Improved exercise technique, movement at target joints, use of target muscles after min to mod verbal, visual, tactile cues.                          PT Education - 04/13/21 1305     Education Details exercise technique/positioning;    Person(s) Educated Patient    Methods Explanation;Verbal cues    Comprehension Verbalized understanding;Returned demonstration;Verbal cues required;Need further instruction              PT Short Term Goals - 03/23/21 1645       PT SHORT TERM GOAL #1   Title Pt will be independent with HEP in order to improve strength and balance in order to decrease fall risk and improve function at home and work.    Baseline 02/02/2021: Patient reports limited HEP in place currently from [facility] agency 12/14 compliance    Time 6    Period Weeks    Status Partially Met    Target Date 03/16/21               PT Long Term Goals - 03/23/21 1609       PT LONG TERM GOAL #1   Title Pt will improve FOTO to target score of 50 to display perceived improvements in ability to complete ADL's.    Baseline 02/02/2021= 43 12/143: 51%    Time 12    Period Weeks    Status Achieved    Target Date 04/27/21      PT LONG TERM GOAL #2   Title Pt will decrease 5TSTS by at least 8 seconds in order to demonstrate clinically significant improvement in LE strength.    Baseline 02/02/2021= 28.22 sec with BUE Support 12/14: 23.1 seconds    Time 12    Period Weeks    Status Partially Met    Target Date 04/27/21      PT LONG TERM GOAL #3   Title Pt will decrease TUG to below 19 seconds/decrease in order to demonstrate decreased fall risk.    Baseline 02/02/2021= 27.30 sec  without AD 12/14: 23 seconds    Time 12    Period Weeks    Status Partially Met    Target Date 04/27/21      PT LONG TERM GOAL #4   Title Pt will increase 6MWT by at least 61m(1673f in order to demonstrate clinically significant improvement in cardiopulmonary endurance and community ambulation  Baseline 02/02/2021= 83 feet in 1 min 10 sec wihtout an AD 12/14: 200 ft in  55mnutes    Time 12    Period Weeks    Status Partially Met    Target Date 04/27/21      PT LONG TERM GOAL #5   Title Pt will increase 10MWT by at least 0.2 m/s in order to demonstrate clinically significant improvement in community ambulation.    Baseline 02/02/2021= 0.42 m/s 12/14: 0.49 m/s    Time 12    Period Weeks    Status Partially Met    Target Date 04/27/21                   Plan - 04/13/21 1347     Clinical Impression Statement Patient motivated and participated well within session. He reports continued fatigue which limits mobility. Patient was instructed in HEP this session with instruction in exercises for when he is feeling fatigue and exercises for when he is feeling good and strong. Patient required cues for proper positioning and exercise technique. Instructed patient to do resisted exercise only 3x a week to allow for recovery and to reduce excessive fatigue. Patient verbalized understanding. During session he required short seated rest breaks after each standing exercise due to fatigue and weakness. He reports moderate to Hampton fatigue at end of session. He would benefit from additional skilled PT intervention to improve strength and mobility;    Personal Factors and Comorbidities Comorbidity 3+    Comorbidities DM, Liver transplant, ESRD, CAD    Examination-Activity Limitations Carry;Lift;Reach Overhead;Squat;Stairs;Stand;Transfers;Toileting    Examination-Participation Restrictions Cleaning;Community Activity;Driving;Medication Management;Meal Prep;Yard Work    SCopyEvolving/Moderate complexity    Rehab Potential Fair    PT Frequency 2x / week    PT Duration 12 weeks    PT Treatment/Interventions ADLs/Self Care Home Management;Cryotherapy;Moist Heat;DME Instruction;Gait training;Stair training;Functional mobility training;Therapeutic activities;Therapeutic exercise;Balance training;Neuromuscular re-education;Patient/family education;Wheelchair mobility training;Manual techniques;Passive range of motion;Dry needling;Energy conservation;Joint Manipulations    PT Next Visit Plan Progressive Therapeutic exercises, Balance training, transfer/gait training    PT Home Exercise Plan No changes or updates today.    Consulted and Agree with Plan of Care Patient             Patient will benefit from skilled therapeutic intervention in order to improve the following deficits and impairments:  Abnormal gait, Cardiopulmonary status limiting activity, Decreased activity tolerance, Decreased balance, Decreased coordination, Decreased endurance, Decreased mobility, Decreased range of motion, Decreased strength, Difficulty walking, Hypomobility, Impaired flexibility, Impaired UE functional use  Visit Diagnosis: Muscle weakness (generalized)  Unsteadiness on feet  Other abnormalities of gait and mobility  Difficulty in walking, not elsewhere classified  Other lack of coordination  Repeated falls     Problem List Patient Active Problem List   Diagnosis Date Noted   Pancreatic cyst    Verbal auditory hallucination    Early satiety    Feeding tube dysfunction    Generalized weakness 11/26/2020   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[place] 11/26/2020   Recurrent falls 11/26/2020   Syncope 10/15/2020   Uremia 10/10/2020   ESRD on hemodialysis ([facility] 10/10/2020   Hyperkalemia 10/10/2020   Elevated troponin 10/10/2020   GERD (gastroesophageal reflux disease) 10/10/2020   CAD (coronary artery disease) 10/10/2020   G tube feedings ([facility] [facility] 10/10/2020    Diarrhea 10/10/2020   Type II diabetes mellitus with renal manifestations ([facility] 10/10/2020   Weakness    ACS (acute coronary syndrome) ([place]    Liver transplant  recipient [facility])    Anemia of chronic disease    Type 2 diabetes mellitus with diabetic neuropathy, with long-term current use of insulin ([place])    ESRD (end stage renal disease) ([place]) 08/19/2020   History of anemia due to chronic kidney disease 08/19/2020   Gait abnormality 10/17/2019   History of fall within past 90 days 10/17/2019   Visual hallucination 10/17/2019   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

## 2021-04-13 NOTE — Patient Instructions (Signed)
Access Code: [email or web address] URL: [email or web address] Date: 04/13/2021 Prepared by: Blanche East To do when not feeling well and fatigued:  Exercises Seated Hip Abduction with Resistance - 1 x daily - 3 x weekly - 2 sets - 15 reps Seated Hip Flexion - 1 x daily - 3 x weekly - 2 sets - 15 reps Sitting Knee Extension with Resistance - 1 x daily - 3 x weekly - 2 sets - 10 reps Seated Heel Toe Raises - 1 x daily - 3 x weekly - 2 sets - 15 reps   For when having a good day and not feeling weak:  Access Code: [email or web address] URL: [email or web address] Date: 04/13/2021 Prepared by: Blanche East  Exercises Standing Hip Abduction with Resistance at Ankles and Counter Support - 1 x daily - 3 x weekly - 1 sets - 10 reps Standing Hip Extension with Resistance at Ankles and Unilateral Counter Support - 1 x daily - 3 x weekly - 1 sets - 10 reps Side Stepping with Resistance at Ankles and Counter Support - 1 x daily - 3 x weekly - 1 sets - 10 reps Standing Partial Squat - 1 x daily - 3 x weekly - 1 sets - 10 reps Standing Heel Raises - 1 x daily - 3 x weekly - 1 sets - 10 reps

## 2021-04-19 ENCOUNTER — Ambulatory Visit: Payer: Medicare Other

## 2021-04-20 ENCOUNTER — Other Ambulatory Visit: Payer: Self-pay

## 2021-04-20 ENCOUNTER — Ambulatory Visit: Payer: Medicare Other

## 2021-04-20 ENCOUNTER — Ambulatory Visit: Payer: Medicare Other | Admitting: Physical Therapy

## 2021-04-20 DIAGNOSIS — R2681 Unsteadiness on feet: Secondary | ICD-10-CM

## 2021-04-20 DIAGNOSIS — R262 Difficulty in walking, not elsewhere classified: Secondary | ICD-10-CM

## 2021-04-20 DIAGNOSIS — R269 Unspecified abnormalities of gait and mobility: Secondary | ICD-10-CM

## 2021-04-20 DIAGNOSIS — M6281 Muscle weakness (generalized): Secondary | ICD-10-CM | POA: Diagnosis not present

## 2021-04-20 DIAGNOSIS — R2689 Other abnormalities of gait and mobility: Secondary | ICD-10-CM

## 2021-04-20 NOTE — Therapy (Signed)
[place] MAIN [facility] SERVICES [address] [place], [place], [address] Phone: [phone]   Fax:  [phone]  Physical Therapy Treatment  Patient Details  Name: Alex Hampton MRN: [id number] Date of Birth: [birth year] No data recorded  Encounter Date: 04/20/2021   PT End of Session - 04/20/21 1714     Visit Number 13    Number of Visits 25    Date for PT Re-Evaluation 04/27/21    Authorization Type Medicare/BCBS; PN 03/23/21    Authorization Time Period Initial Cert= 34/74/2595- 63/87/5643    Progress Note Due on Visit 20    PT Start Time 1346    PT Stop Time 1430    PT Time Calculation (min) 44 min    Equipment Utilized During Treatment Gait belt    Activity Tolerance Patient limited by fatigue;Patient tolerated treatment well    Behavior During Therapy [facility] for tasks assessed/performed             Past Medical History:  Diagnosis Date   Depression    History of cardiac cath    History of heart artery stent    Hyperlipidemia    Hypertension    MI (myocardial infarction) ([place])    Renal disorder     Past Surgical History:  Procedure Laterality Date   IR GJ TUBE CHANGE  11/22/2020   IR [place] TUBE CHANGE  11/26/2020   IR [place] GASTRO/COLONIC TUBE PERCUT W/FLUORO  10/14/2020   LEFT HEART CATH AND CORONARY ANGIOGRAPHY N/A 08/19/2020   Procedure: LEFT HEART CATH AND CORONARY ANGIOGRAPHY;  Surgeon: Corey Skains, MD;  Location: [place] [facility];  Service: Cardiovascular;  Laterality: N/A;   LIVER TRANSPLANT      There were no vitals filed for this visit.   Subjective Assessment - 04/20/21 1712     Subjective Patient reports doing okay. he report ssom enausea at beginning of session and requests emitus bag in case he needs to vomit. No other changes since last visit.    Patient is accompained by: Family member   [name] (she brought him but did not come back during eval)   Pertinent History Patient is a 66 year old male  with recent referral for abnomality of gait and recent liver transplant in November 2021. He has past medical history significant for Liver transplant (02/18/2020), CKD, End stage renal disease- On hemodialyis T/TH/Sat., Anemia, DM with neuropathy, Non-stemi, Coronary artery disease, Feeding tube with poor appetite.    Limitations Standing;Lifting;House hold activities;Walking    How long can you sit comfortably? no issues    How long can you stand comfortably? < 90mn    How long can you walk comfortably? < 2 min    Patient Stated Goals I want to return to my normal self- Independent with all mobility, not fall, with improved overall stamina.              INTERVENTIONS   Treatment:   Therapeutic exercises:    Nustep Interval, seat #13, arm #11 (intermittent UE use) -1 min at L0 -1 min at L1 -1 min at L0 -1 min at L2- rates difficulty at Medium to medium + -1 min at L0 1 min level 2 Completed 6 min total, total distance: 0.15 miles  Seated LAQ with RTB around ankles x 15 ea Seated marching with 3# AW x 15 ea Heel toe raises 2 x 15 with 3# AW donned  Seated hurdle step over with 3# AW  x 10 ea    Ambulation with 4WW, CGA for LE and cardiorespiratory endurance  3x140 ft, Required cues to increase erect posture and increase step length for better gait safety; Required  short seated rest break after walking; Vitals: SPo2 96 following ambulatory bouts and HR 85 max    Pt educated throughout session about proper posture and technique with exercises. Improved exercise technique, movement at target joints, use of target muscles after min to mod verbal, visual, tactile cues.                             PT Education - 04/20/21 1714     Education Details Progress with endurance this session    Person(s) Educated Patient    Methods Explanation    Comprehension Verbalized understanding              PT Short Term Goals - 03/23/21 1645       PT SHORT  TERM GOAL #1   Title Pt will be independent with HEP in order to improve strength and balance in order to decrease fall risk and improve function at home and work.    Baseline 02/02/2021: Patient reports limited HEP in place currently from [facility] agency 12/14 compliance    Time 6    Period Weeks    Status Partially Met    Target Date 03/16/21               PT Long Term Goals - 03/23/21 1609       PT LONG TERM GOAL #1   Title Pt will improve FOTO to target score of 50 to display perceived improvements in ability to complete ADL's.    Baseline 02/02/2021= 43 12/143: 51%    Time 12    Period Weeks    Status Achieved    Target Date 04/27/21      PT LONG TERM GOAL #2   Title Pt will decrease 5TSTS by at least 8 seconds in order to demonstrate clinically significant improvement in LE strength.    Baseline 02/02/2021= 28.22 sec with BUE Support 12/14: 23.1 seconds    Time 12    Period Weeks    Status Partially Met    Target Date 04/27/21      PT LONG TERM GOAL #3   Title Pt will decrease TUG to below 19 seconds/decrease in order to demonstrate decreased fall risk.    Baseline 02/02/2021= 27.30 sec without AD 12/14: 23 seconds    Time 12    Period Weeks    Status Partially Met    Target Date 04/27/21      PT LONG TERM GOAL #4   Title Pt will increase 6MWT by at least 10m(1697f in order to demonstrate clinically significant improvement in cardiopulmonary endurance and community ambulation    Baseline 02/02/2021= 83 feet in 1 min 10 sec wihtout an AD 12/14: 200 ft in  16m37mtes    Time 12    Period Weeks    Status Partially Met    Target Date 04/27/21      PT LONG TERM GOAL #5   Title Pt will increase 10MWT by at least 0.2 m/s in order to demonstrate clinically significant improvement in community ambulation.    Baseline 02/02/2021= 0.42 m/s 12/14: 0.49 m/s    Time 12    Period Weeks    Status Partially Met    Target Date 04/27/21  Plan -  04/20/21 1715     Clinical Impression Statement Patient motivated participated well within physical therapy session.  Patient does continue to have fatigue as well as nausea which limits his mobility and endurance.  Patient was able to ambulate prolonged distances during today's session utilizing interval walking training.  Patient did have slight decrease in oxygen saturation and patient recovered to normal within several minutes and oxygen saturation did not drop below 96 during today's session.  Patient does continue to have some nausea during session and required emesis bag but did not have any symptoms of vomiting during today's session.  Patient did require seated rest breaks throughout session prevent excessive fatigue and nausea.  Patient continue to benefit from skilled physical therapy intervention in order to improve his strength, mobility, and overall quality of life.    Personal Factors and Comorbidities Comorbidity 3+    Comorbidities DM, Liver transplant, ESRD, CAD    Examination-Activity Limitations Carry;Lift;Reach Overhead;Squat;Stairs;Stand;Transfers;Toileting    Examination-Participation Restrictions Cleaning;Community Activity;Driving;Medication Management;Meal Prep;Yard Work    Merchant navy officer Evolving/Moderate complexity    Rehab Potential Fair    PT Frequency 2x / week    PT Duration 12 weeks    PT Treatment/Interventions ADLs/Self Care Home Management;Cryotherapy;Moist Heat;DME Instruction;Gait training;Stair training;Functional mobility training;Therapeutic activities;Therapeutic exercise;Balance training;Neuromuscular re-education;Patient/family education;Wheelchair mobility training;Manual techniques;Passive range of motion;Dry needling;Energy conservation;Joint Manipulations    PT Next Visit Plan Progressive Therapeutic exercises, Balance training, transfer/gait training    PT Home Exercise Plan No changes or updates today.    Consulted and Agree with  Plan of Care Patient             Patient will benefit from skilled therapeutic intervention in order to improve the following deficits and impairments:  Abnormal gait, Cardiopulmonary status limiting activity, Decreased activity tolerance, Decreased balance, Decreased coordination, Decreased endurance, Decreased mobility, Decreased range of motion, Decreased strength, Difficulty walking, Hypomobility, Impaired flexibility, Impaired UE functional use  Visit Diagnosis: Unsteadiness on feet  Other abnormalities of gait and mobility  Difficulty in walking, not elsewhere classified  Abnormality of gait and mobility     Problem List Patient Active Problem List   Diagnosis Date Noted   Pancreatic cyst    Verbal auditory hallucination    Early satiety    Feeding tube dysfunction    Generalized weakness 11/26/2020   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[place]) 11/26/2020   Recurrent falls 11/26/2020   Syncope 10/15/2020   Uremia 10/10/2020   ESRD on hemodialysis ([facility]) 10/10/2020   Hyperkalemia 10/10/2020   Elevated troponin 10/10/2020   GERD (gastroesophageal reflux disease) 10/10/2020   CAD (coronary artery disease) 10/10/2020   G tube feedings ([place]) 10/10/2020   Diarrhea 10/10/2020   Type II diabetes mellitus with renal manifestations ([place]) 10/10/2020   Weakness    ACS (acute coronary syndrome) ([place])    Liver transplant recipient [facility])    Anemia of chronic disease    Type 2 diabetes mellitus with diabetic neuropathy, with long-term current use of insulin ([place])    ESRD (end stage renal disease) ([place]) 08/19/2020   History of anemia due to chronic kidney disease 08/19/2020   Gait abnormality 10/17/2019   History of fall within past 90 days 10/17/2019   Visual hallucination 10/17/2019   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

## 2021-04-25 ENCOUNTER — Other Ambulatory Visit: Payer: Self-pay

## 2021-04-25 ENCOUNTER — Ambulatory Visit: Payer: Medicare Other

## 2021-04-25 DIAGNOSIS — M6281 Muscle weakness (generalized): Secondary | ICD-10-CM | POA: Diagnosis not present

## 2021-04-25 DIAGNOSIS — R262 Difficulty in walking, not elsewhere classified: Secondary | ICD-10-CM

## 2021-04-25 DIAGNOSIS — R2689 Other abnormalities of gait and mobility: Secondary | ICD-10-CM

## 2021-04-25 DIAGNOSIS — R2681 Unsteadiness on feet: Secondary | ICD-10-CM

## 2021-04-25 NOTE — Therapy (Signed)
[place] MAIN [facility] SERVICES [address] [place], [place], [address] Phone: [phone]   Fax:  [phone]  Physical Therapy Treatment  Patient Details  Name: Alex Hampton MRN: [id number] Date of Birth: Sep 29, 1955 No data recorded  Encounter Date: 04/25/2021   PT End of Session - 04/25/21 1435     Visit Number 14    Number of Visits 25    Date for PT Re-Evaluation 04/27/21    Authorization Type Medicare/BCBS; PN 03/23/21    Authorization Time Period Initial Cert= 70/48/8891- 69/45/0388    Progress Note Due on Visit 20    PT Start Time 1430    PT Stop Time 1514    PT Time Calculation (min) 44 min    Equipment Utilized During Treatment Gait belt    Activity Tolerance Patient limited by fatigue;Patient tolerated treatment well    Behavior During Therapy WFL for tasks assessed/performed             Past Medical History:  Diagnosis Date   Depression    History of cardiac cath    History of heart artery stent    Hyperlipidemia    Hypertension    MI (myocardial infarction) ([place])    Renal disorder     Past Surgical History:  Procedure Laterality Date   IR [place] TUBE CHANGE  11/22/2020   IR [place] TUBE CHANGE  11/26/2020   IR [place] GASTRO/COLONIC TUBE PERCUT W/FLUORO  10/14/2020   LEFT HEART CATH AND CORONARY ANGIOGRAPHY N/A 08/19/2020   Procedure: LEFT HEART CATH AND CORONARY ANGIOGRAPHY;  Surgeon: Corey Skains, MD;  Location: [facility];  Service: Cardiovascular;  Laterality: N/A;   LIVER TRANSPLANT      There were no vitals filed for this visit.   Subjective Assessment - 04/25/21 1433     Subjective Patient reports feeling nausea today, feels like he isn't eating enough. No stumbles or falls since last session.    Patient is accompained by: Family member   Dtr- Alex Hampton (she brought him but did not come back during eval)   Pertinent History Patient is a 66 year old male with recent referral for abnomality of gait and  recent liver transplant in November 2021. He has past medical history significant for Liver transplant (02/18/2020), CKD, End stage renal disease- On hemodialyis T/TH/Sat., Anemia, DM with neuropathy, Non-stemi, Coronary artery disease, Feeding tube with poor appetite.    Limitations Standing;Lifting;House hold activities;Walking    How long can you sit comfortably? no issues    How long can you stand comfortably? < 74mn    How long can you walk comfortably? < 2 min    Patient Stated Goals I want to return to my normal self- Independent with all mobility, not fall, with improved overall stamina.    Currently in Pain? No/denies                 INTERVENTIONS   Treatment:   Therapeutic exercises:    Nustep Interval, seat #13, arm #13 (intermittent UE use) -1 min at L1 -1 min at L0 -1 min at L1 -1 min at L2- rates difficulty at Medium to medium + -1 min at L1 Completed 5 min total, total distance: 0.12 miles     Therapeutic Exercises:  -Standing Step tap alternating LE's  x 10 reps each (6"block)  (Cues to try to limit UE support on // bars as able) Patient unable to remove UE from support due to  weakness/imbalance.   Lateral step up/down 6" step sandwiched between two airex pads; patient reports severe fatigue ; 8x each direction  Airex pad 6" step modified tandem stance 2x30 seconds each LE placement  High knee march using GTB across // bars x 15 reps (VC to raise knee to height of // bars). Patient rated activity as medium; PT foot between patients feet to increase BOS  Seated: GTB hamstring curl 15x each LE   GTB alternating Er/IR 15x each LE  10x STS -terminated at 8 due to severe nausea   Pt educated throughout session about proper posture and technique with exercises. Improved exercise technique, movement at target joints, use of target muscles after min to mod verbal, visual, tactile cues.    Patient presents with excellent motivation despite fatigue and nausea.  Vitals monitored and remained in therapeutic range. Patient had episode of nausea requiring him to lay down.  Patient's symptoms decreased and patient was able to go home with son.  Patient continue to benefit from skilled physical therapy intervention in order to improve his strength, mobility, and overall quality of life                    PT Education - 04/25/21 1434     Education Details exercise technique, body mechanics    Person(s) Educated Patient    Methods Explanation;Demonstration;Tactile cues;Verbal cues    Comprehension Verbalized understanding;Returned demonstration;Verbal cues required;Tactile cues required              PT Short Term Goals - 03/23/21 1645       PT SHORT TERM GOAL #1   Title Pt will be independent with HEP in order to improve strength and balance in order to decrease fall risk and improve function at home and work.    Baseline 02/02/2021: Patient reports limited HEP in place currently from [facility] agency 12/14 compliance    Time 6    Period Weeks    Status Partially Met    Target Date 03/16/21               PT Long Term Goals - 03/23/21 1609       PT LONG TERM GOAL #1   Title Pt will improve FOTO to target score of 50 to display perceived improvements in ability to complete ADL's.    Baseline 02/02/2021= 43 12/143: 51%    Time 12    Period Weeks    Status Achieved    Target Date 04/27/21      PT LONG TERM GOAL #2   Title Pt will decrease 5TSTS by at least 8 seconds in order to demonstrate clinically significant improvement in LE strength.    Baseline 02/02/2021= 28.22 sec with BUE Support 12/14: 23.1 seconds    Time 12    Period Weeks    Status Partially Met    Target Date 04/27/21      PT LONG TERM GOAL #3   Title Pt will decrease TUG to below 19 seconds/decrease in order to demonstrate decreased fall risk.    Baseline 02/02/2021= 27.30 sec without AD 12/14: 23 seconds    Time 12    Period Weeks    Status Partially  Met    Target Date 04/27/21      PT LONG TERM GOAL #4   Title Pt will increase 6MWT by at least 64m(1670f in order to demonstrate clinically significant improvement in cardiopulmonary endurance and community ambulation    Baseline 02/02/2021=  83 feet in 1 min 10 sec wihtout an AD 12/14: 200 ft in  62mnutes    Time 12    Period Weeks    Status Partially Met    Target Date 04/27/21      PT LONG TERM GOAL #5   Title Pt will increase 10MWT by at least 0.2 m/s in order to demonstrate clinically significant improvement in community ambulation.    Baseline 02/02/2021= 0.42 m/s 12/14: 0.49 m/s    Time 12    Period Weeks    Status Partially Met    Target Date 04/27/21                   Plan - 04/25/21 1508     Clinical Impression Statement Patient presents with excellent motivation despite fatigue and nausea. Vitals monitored and remained in therapeutic range. Patient had episode of nausea requiring him to lay down.  Patient's symptoms decreased and patient was able to go home with son.  Patient continue to benefit from skilled physical therapy intervention in order to improve his strength, mobility, and overall quality of life    Personal Factors and Comorbidities Comorbidity 3+    Comorbidities DM, Liver transplant, ESRD, CAD    Examination-Activity Limitations Carry;Lift;Reach Overhead;Squat;Stairs;Stand;Transfers;Toileting    Examination-Participation Restrictions Cleaning;Community Activity;Driving;Medication Management;Meal Prep;Yard Work    SMerchant navy officerEvolving/Moderate complexity    Rehab Potential Fair    PT Frequency 2x / week    PT Duration 12 weeks    PT Treatment/Interventions ADLs/Self Care Home Management;Cryotherapy;Moist Heat;DME Instruction;Gait training;Stair training;Functional mobility training;Therapeutic activities;Therapeutic exercise;Balance training;Neuromuscular re-education;Patient/family education;Wheelchair mobility  training;Manual techniques;Passive range of motion;Dry needling;Energy conservation;Joint Manipulations    PT Next Visit Plan Progressive Therapeutic exercises, Balance training, transfer/gait training    PT Home Exercise Plan No changes or updates today.    Consulted and Agree with Plan of Care Patient             Patient will benefit from skilled therapeutic intervention in order to improve the following deficits and impairments:  Abnormal gait, Cardiopulmonary status limiting activity, Decreased activity tolerance, Decreased balance, Decreased coordination, Decreased endurance, Decreased mobility, Decreased range of motion, Decreased strength, Difficulty walking, Hypomobility, Impaired flexibility, Impaired UE functional use  Visit Diagnosis: Unsteadiness on feet  Other abnormalities of gait and mobility  Difficulty in walking, not elsewhere classified     Problem List Patient Active Problem List   Diagnosis Date Noted   Pancreatic cyst    Verbal auditory hallucination    Early satiety    Feeding tube dysfunction    Generalized weakness 11/26/2020   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[place] 11/26/2020   Recurrent falls 11/26/2020   Syncope 10/15/2020   Uremia 10/10/2020   ESRD on hemodialysis ([place] 10/10/2020   Hyperkalemia 10/10/2020   Elevated troponin 10/10/2020   GERD (gastroesophageal reflux disease) 10/10/2020   CAD (coronary artery disease) 10/10/2020   G tube feedings ([place] 10/10/2020   Diarrhea 10/10/2020   Type II diabetes mellitus with renal manifestations ([place] 10/10/2020   Weakness    ACS (acute coronary syndrome) ([place]    L[facility] transplant recipient ([facility]    Anemia of chronic disease    Type 2 diabetes mellitus with diabetic neuropathy, with long-term current use of insulin ([place]    ESRD (end stage renal disease) ([place] 08/19/2020   History of anemia due to chronic kidney disease 08/19/2020   Gait abnormality 10/17/2019   History of fall within past 90 days  10/17/2019   Visual  hallucination 10/17/2019   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 [place], [place], [address] Phone: [phone]   Fax:  [phone]  Name: Alex Hampton MRN: [id number] Date of Birth: 05-15-1955

## 2021-04-26 ENCOUNTER — Ambulatory Visit: Payer: Medicare Other | Admitting: Physical Therapy

## 2021-04-27 ENCOUNTER — Ambulatory Visit: Payer: Medicare Other

## 2021-04-28 ENCOUNTER — Ambulatory Visit: Payer: Medicare Other | Admitting: Physical Therapy

## 2021-04-29 ENCOUNTER — Ambulatory Visit: Payer: Medicare Other

## 2021-04-29 ENCOUNTER — Other Ambulatory Visit: Payer: Self-pay

## 2021-04-29 DIAGNOSIS — R269 Unspecified abnormalities of gait and mobility: Secondary | ICD-10-CM

## 2021-04-29 DIAGNOSIS — M6281 Muscle weakness (generalized): Secondary | ICD-10-CM | POA: Diagnosis not present

## 2021-04-29 DIAGNOSIS — R2681 Unsteadiness on feet: Secondary | ICD-10-CM

## 2021-04-29 DIAGNOSIS — R262 Difficulty in walking, not elsewhere classified: Secondary | ICD-10-CM

## 2021-04-29 NOTE — Therapy (Signed)
[place] MAIN [facility] SERVICES [address] [place], [place], [address] Phone: [phone]   Fax:  [phone]  Physical Therapy Treatment/Recertification for dates 04/29/2021- 07/22/2021  Patient Details  Name: Alex Hampton MRN: [id number] Date of Birth: [birth year] No data recorded  Encounter Date: 04/29/2021   PT End of Session - 04/29/21 1056     Visit Number 15    Number of Visits 40    Date for PT Re-Evaluation 04/27/21    Authorization Type Medicare/BCBS; PN 03/23/21    Authorization Time Period Initial Cert= 40/01/2724- 36/64/4034    Progress Note Due on Visit 20    PT Start Time 1020    PT Stop Time 1048    PT Time Calculation (min) 28 min    Equipment Utilized During Treatment Gait belt    Activity Tolerance Patient limited by fatigue;Patient limited by lethargy;Other (comment)   Patient became nauseous with mobility/testing today   Behavior During Therapy WFL for tasks assessed/performed             Past Medical History:  Diagnosis Date   Depression    History of cardiac cath    History of heart artery stent    Hyperlipidemia    Hypertension    MI (myocardial infarction) ([place])    Renal disorder     Past Surgical History:  Procedure Laterality Date   IR GJ TUBE CHANGE  11/22/2020   IR [place] TUBE CHANGE  11/26/2020   IR [place] GASTRO/COLONIC TUBE PERCUT W/FLUORO  10/14/2020   LEFT HEART CATH AND CORONARY ANGIOGRAPHY N/A 08/19/2020   Procedure: LEFT HEART CATH AND CORONARY ANGIOGRAPHY;  Surgeon: Corey Skains, MD;  Location: [facility];  Service: Cardiovascular;  Laterality: N/A;   LIVER TRANSPLANT      There were no vitals filed for this visit.   Subjective Assessment - 04/29/21 1032     Subjective Patient reports just waking up about 30 min prior to visit-    Patient is accompained by: Family member   [name] (she brought him but did not come back during eval)   Pertinent History Patient is a 66 year  old male with recent referral for abnomality of gait and recent liver transplant in November 2021. He has past medical history significant for Liver transplant (02/18/2020), CKD, End stage renal disease- On hemodialyis T/TH/Sat., Anemia, DM with neuropathy, Non-stemi, Coronary artery disease, Feeding tube with poor appetite.    Limitations Standing;Lifting;House hold activities;Walking    How long can you sit comfortably? no issues    How long can you stand comfortably? < 59mn    How long can you walk comfortably? < 2 min    Patient Stated Goals I want to return to my normal self- Independent with all mobility, not fall, with improved overall stamina.            BP= 109/73; HR=76 bpm; O2= 92%   HEP: Patient states no questions regarding his current exercise regimen and states he is compliant.  FOTO: 53 (improved from 51 last assessment)   5xSTS: Unable to test today due to patient became nauseous with 6 min walk test and attempted TUG- unable to continue today.   TUG: Attempted- Patient stood up (after recovering from being nauseated during 6 min walk test) yet sat right back down stating he was getting sick again and requested to terminate visit today.   6 MWT: Patient ambulated approx 60 feet yet had to stop  due onset of nausea and unable to complete today.   10MWT:  Patient unable to test today secondary to being nauseated.                         PT Education - 04/29/21 1055     Education Details Purpose of functional outcome measures and Plan of care for Recert    Person(s) Educated Patient    Methods Explanation;Demonstration;Tactile cues;Verbal cues    Comprehension Verbalized understanding;Returned demonstration;Verbal cues required;Tactile cues required;Need further instruction              PT Short Term Goals - 04/29/21 1058       PT SHORT TERM GOAL #1   Title Pt will be independent with HEP in order to improve strength and balance in order  to decrease fall risk and improve function at home and work.    Baseline 02/02/2021: Patient reports limited HEP in place currently from [facility] agency 12/14 compliance; 1/20/2023Patient states no questions regarding his current exercise regimen and states he is compliant.    Time 6    Period Weeks    Status Achieved    Target Date 03/16/21               PT Long Term Goals - 04/29/21 1059       PT LONG TERM GOAL #1   Title Pt will improve FOTO to target score of 50 to display perceived improvements in ability to complete ADL's.    Baseline 02/02/2021= 43 12/143: 51%; 04/29/2021= 53%    Time 12    Period Weeks    Status Achieved      PT LONG TERM GOAL #2   Title Pt will decrease 5TSTS by at least 8 seconds in order to demonstrate clinically significant improvement in LE strength.    Baseline 02/02/2021= 28.22 sec with BUE Support 12/14: 23.1 seconds; 04/29/2021=Unable to test today due to patient became nauseous with 6 min walk test and attempted TUG- unable to continue- will reassess next 1-2 visits    Time 12    Period Weeks    Status Partially Met    Target Date 07/22/21      PT LONG TERM GOAL #3   Title Pt will decrease TUG to below 19 seconds/decrease in order to demonstrate decreased fall risk.    Baseline 02/02/2021= 27.30 sec without AD 12/14: 23 seconds; 04/29/2021- Unable to assess as patient became very nauseated upon standing and request to stop treatment.    Time 12    Period Weeks    Status Partially Met    Target Date 07/22/21      PT LONG TERM GOAL #4   Title Pt will increase 6MWT by at least 34m(1625f in order to demonstrate clinically significant improvement in cardiopulmonary endurance and community ambulation    Baseline 02/02/2021= 83 feet in 1 min 10 sec wihtout an AD 12/14: 200 ft in  8m1mtes; 04/29/2021=Patient ambulated approx 60 feet yet had to stop due ?onset of nausea and unable to complete today    Time 12    Period Weeks    Status Partially Met     Target Date 07/22/21      PT LONG TERM GOAL #5   Title Pt will increase 10MWT by at least 0.2 m/s in order to demonstrate clinically significant improvement in community ambulation.    Baseline 02/02/2021= 0.42 m/s 12/14: 0.49 m/s; 04/29/2021=Patient unable to test today secondary  to being nauseated.    Time 12    Period Weeks    Status Partially Met    Target Date 07/22/21                   Plan - 04/29/21 1057     Clinical Impression Statement Treatment severely limited for recert visit secondary to patient became nauseated with functional outcome testing and unable to recover. He has progressed though in the past 12 weeks despite being intermittently sick and receiving dialysis 3x/week. He is knowledgeable of a home program and continues to score higher on his FOTO showing improved self - perceived abilities. He did make progress in December as seen by values documented for progress note. Patient's condition has the potential to improve in response to therapy. Maximum improvement is yet to be obtained. The anticipated improvement is attainable and reasonable in a generally predictable time.  Will attempt to retest several goals next visit as appropriate and update goals accordingly.    Personal Factors and Comorbidities Comorbidity 3+    Comorbidities DM, Liver transplant, ESRD, CAD    Examination-Activity Limitations Carry;Lift;Reach Overhead;Squat;Stairs;Stand;Transfers;Toileting    Examination-Participation Restrictions Cleaning;Community Activity;Driving;Medication Management;Meal Prep;Yard Work    Merchant navy officer Evolving/Moderate complexity    Rehab Potential Fair    PT Frequency 2x / week    PT Duration 12 weeks    PT Treatment/Interventions ADLs/Self Care Home Management;Cryotherapy;Moist Heat;DME Instruction;Gait training;Stair training;Functional mobility training;Therapeutic activities;Therapeutic exercise;Balance training;Neuromuscular  re-education;Patient/family education;Wheelchair mobility training;Manual techniques;Passive range of motion;Dry needling;Energy conservation;Joint Manipulations    PT Next Visit Plan Progressive Therapeutic exercises, Balance training, transfer/gait training    PT Home Exercise Plan No changes or updates today.    Consulted and Agree with Plan of Care Patient             Patient will benefit from skilled therapeutic intervention in order to improve the following deficits and impairments:  Abnormal gait, Cardiopulmonary status limiting activity, Decreased activity tolerance, Decreased balance, Decreased coordination, Decreased endurance, Decreased mobility, Decreased range of motion, Decreased strength, Difficulty walking, Hypomobility, Impaired flexibility, Impaired UE functional use  Visit Diagnosis: Abnormality of gait and mobility  Difficulty in walking, not elsewhere classified  Muscle weakness (generalized)  Unsteadiness on feet     Problem List Patient Active Problem List   Diagnosis Date Noted   Pancreatic cyst    Verbal auditory hallucination    Early satiety    Feeding tube dysfunction    Generalized weakness 11/26/2020   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[place]) 11/26/2020   Recurrent falls 11/26/2020   Syncope 10/15/2020   Uremia 10/10/2020   ESRD on hemodialysis ([place]) 10/10/2020   Hyperkalemia 10/10/2020   Elevated troponin 10/10/2020   GERD (gastroesophageal reflux disease) 10/10/2020   CAD (coronary artery disease) 10/10/2020   G tube feedings ([place]) 10/10/2020   Diarrhea 10/10/2020   Type II diabetes mellitus with renal manifestations ([place]) 10/10/2020   Weakness    ACS (acute coronary syndrome) ([place])    Liver transplant recipient [facility])    Anemia of chronic disease    Type 2 diabetes mellitus with diabetic neuropathy, with long-term current use of insulin ([place])    ESRD (end stage renal disease) ([place]) 08/19/2020   History of anemia due to chronic kidney  disease 08/19/2020   Gait abnormality 10/17/2019   History of fall within past 90 days 10/17/2019   Visual hallucination 10/17/2019   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

## 2021-05-03 ENCOUNTER — Ambulatory Visit: Payer: Medicare Other

## 2021-05-04 ENCOUNTER — Ambulatory Visit: Payer: Medicare Other

## 2021-05-05 ENCOUNTER — Ambulatory Visit: Payer: Medicare Other

## 2021-05-09 ENCOUNTER — Ambulatory Visit: Payer: Medicare Other

## 2021-05-11 ENCOUNTER — Ambulatory Visit: Payer: Medicare Other | Attending: Gastroenterology

## 2021-05-11 DIAGNOSIS — R262 Difficulty in walking, not elsewhere classified: Secondary | ICD-10-CM | POA: Insufficient documentation

## 2021-05-11 DIAGNOSIS — M6281 Muscle weakness (generalized): Secondary | ICD-10-CM | POA: Insufficient documentation

## 2021-05-11 DIAGNOSIS — R296 Repeated falls: Secondary | ICD-10-CM | POA: Insufficient documentation

## 2021-05-11 DIAGNOSIS — R269 Unspecified abnormalities of gait and mobility: Secondary | ICD-10-CM | POA: Insufficient documentation

## 2021-05-11 DIAGNOSIS — R2681 Unsteadiness on feet: Secondary | ICD-10-CM | POA: Insufficient documentation

## 2021-05-11 DIAGNOSIS — R2689 Other abnormalities of gait and mobility: Secondary | ICD-10-CM | POA: Insufficient documentation

## 2021-05-11 DIAGNOSIS — R278 Other lack of coordination: Secondary | ICD-10-CM | POA: Insufficient documentation

## 2021-05-16 ENCOUNTER — Other Ambulatory Visit: Payer: Self-pay

## 2021-05-16 ENCOUNTER — Ambulatory Visit: Payer: Medicare Other

## 2021-05-16 DIAGNOSIS — M6281 Muscle weakness (generalized): Secondary | ICD-10-CM

## 2021-05-16 DIAGNOSIS — R269 Unspecified abnormalities of gait and mobility: Secondary | ICD-10-CM

## 2021-05-16 DIAGNOSIS — R262 Difficulty in walking, not elsewhere classified: Secondary | ICD-10-CM

## 2021-05-16 DIAGNOSIS — R2681 Unsteadiness on feet: Secondary | ICD-10-CM

## 2021-05-16 DIAGNOSIS — R296 Repeated falls: Secondary | ICD-10-CM | POA: Diagnosis present

## 2021-05-16 DIAGNOSIS — R2689 Other abnormalities of gait and mobility: Secondary | ICD-10-CM | POA: Diagnosis present

## 2021-05-16 DIAGNOSIS — R278 Other lack of coordination: Secondary | ICD-10-CM | POA: Diagnosis present

## 2021-05-16 NOTE — Therapy (Signed)
[place] MAIN [facility] SERVICES [address] [place], [place], [address] Phone: [phone]   Fax:  [phone]  Physical Therapy Treatment  Patient Details  Name: Alex Hampton MRN: [id number] Date of Birth: 07/24/1955 No data recorded  Encounter Date: 05/16/2021   PT End of Session - 05/16/21 0954     Visit Number 16    Number of Visits 40    Date for PT Re-Evaluation 04/27/21    Authorization Type Medicare/BCBS; PN 03/23/21    Authorization Time Period Initial Cert= 42/70/6237- 62/83/1517    Progress Note Due on Visit 20    PT Start Time 6160    PT Stop Time 1342    PT Time Calculation (min) 37 min    Equipment Utilized During Treatment Gait belt    Activity Tolerance Patient limited by fatigue;Patient limited by lethargy;Other (comment)   Patient became nauseous with mobility/testing today   Behavior During Therapy WFL for tasks assessed/performed             Past Medical History:  Diagnosis Date   Depression    History of cardiac cath    History of heart artery stent    Hyperlipidemia    Hypertension    MI (myocardial infarction) ([place])    Renal disorder     Past Surgical History:  Procedure Laterality Date   IR [place] TUBE CHANGE  11/22/2020   IR [place] TUBE CHANGE  11/26/2020   IR [place] GASTRO/COLONIC TUBE PERCUT W/FLUORO  10/14/2020   LEFT HEART CATH AND CORONARY ANGIOGRAPHY N/A 08/19/2020   Procedure: LEFT HEART CATH AND CORONARY ANGIOGRAPHY;  Surgeon: Corey Skains, MD;  Location: [place] [facility];  Service: Cardiovascular;  Laterality: N/A;   LIVER TRANSPLANT      There were no vitals filed for this visit.   Subjective Assessment - 05/16/21 1309     Subjective Patient reports he was re-hospitalized on Saturday 05/07/2021 with generalized weakness and nausea.    Patient is accompained by: Family member   [name] (she brought him but did not come back during eval)   Pertinent History Patient is a 66 year old male  with recent referral for abnomality of gait and recent liver transplant in November 2021. He has past medical history significant for Liver transplant (02/18/2020), CKD, End stage renal disease- On hemodialyis T/TH/Sat., Anemia, DM with neuropathy, Non-stemi, Coronary artery disease, Feeding tube with poor appetite.    Limitations Standing;Lifting;House hold activities;Walking    How long can you sit comfortably? no issues    How long can you stand comfortably? < 61mn    How long can you walk comfortably? < 2 min    Patient Stated Goals I want to return to my normal self- Independent with all mobility, not fall, with improved overall stamina.    Currently in Pain? No/denies              INTERVENTIONS:   Therapeutic Exercises:  Seated LE strengthening exercises:   -Hip march alt LE's x 10 reps each using 2lb AW -Hip flex/abd up and over hedgehog x 10 reps each LE using 2lb AW -Knee ext alt LE's x 10 reps using 2lb. AW -Hamstring curls Alt LE's x 10 reps. Using blue theraband    -Scap rows with RTB 2 sets of 10 reps Horizontal Shoulder abd- Difficulty keeping arms up x 10 reps   Patient request to go to bathroom secondary to having some stomach issues- intermittent dirrhea-  Assisted patient to bathroom. After bathroom-   Patient requested to lay down and rest- BP= 122/73 mmHg right UE supine at rest.  Patient laid down for approx 10 min and requested to go home. Session terminated and patient assisted from supine to sit and to transport chair and  informed his son of details of today's session.                            PT Education - 05/16/21 1334     Education Details Exercise technique    Person(s) Educated Patient    Methods Explanation;Demonstration;Tactile cues;Verbal cues    Comprehension Verbalized understanding;Returned demonstration;Verbal cues required;Tactile cues required;Need further instruction              PT Short Term Goals -  04/29/21 1058       PT SHORT TERM GOAL #1   Title Pt will be independent with HEP in order to improve strength and balance in order to decrease fall risk and improve function at home and work.    Baseline 02/02/2021: Patient reports limited HEP in place currently from [facility] agency 12/14 compliance; 1/20/2023Patient states no questions regarding his current exercise regimen and states he is compliant.    Time 6    Period Weeks    Status Achieved    Target Date 03/16/21               PT Long Term Goals - 04/29/21 1059       PT LONG TERM GOAL #1   Title Pt will improve FOTO to target score of 50 to display perceived improvements in ability to complete ADL's.    Baseline 02/02/2021= 43 12/143: 51%; 04/29/2021= 53%    Time 12    Period Weeks    Status Achieved      PT LONG TERM GOAL #2   Title Pt will decrease 5TSTS by at least 8 seconds in order to demonstrate clinically significant improvement in LE strength.    Baseline 02/02/2021= 28.22 sec with BUE Support 12/14: 23.1 seconds; 04/29/2021=Unable to test today due to patient became nauseous with 6 min walk test and attempted TUG- unable to continue- will reassess next 1-2 visits    Time 12    Period Weeks    Status Partially Met    Target Date 07/22/21      PT LONG TERM GOAL #3   Title Pt will decrease TUG to below 19 seconds/decrease in order to demonstrate decreased fall risk.    Baseline 02/02/2021= 27.30 sec without AD 12/14: 23 seconds; 04/29/2021- Unable to assess as patient became very nauseated upon standing and request to stop treatment.    Time 12    Period Weeks    Status Partially Met    Target Date 07/22/21      PT LONG TERM GOAL #4   Title Pt will increase 6MWT by at least 42m(1686f in order to demonstrate clinically significant improvement in cardiopulmonary endurance and community ambulation    Baseline 02/02/2021= 83 feet in 1 min 10 sec wihtout an AD 12/14: 200 ft in  74m77mtes; 04/29/2021=Patient ambulated  approx 60 feet yet had to stop due ?onset of nausea and unable to complete today    Time 12    Period Weeks    Status Partially Met    Target Date 07/22/21      PT LONG TERM GOAL #5   Title Pt will increase 10MWT  by at least 0.2 m/s in order to demonstrate clinically significant improvement in community ambulation.    Baseline 02/02/2021= 0.42 m/s 12/14: 0.49 m/s; 04/29/2021=Patient unable to test today secondary to being nauseated.    Time 12    Period Weeks    Status Partially Met    Target Date 07/22/21                   Plan - 05/16/21 8891     Clinical Impression Statement Patient arrived with initial good motivation to try to exercise but did report not feeling well stating his diarrhea had returned earlier today. He was able to follow VC and perform some basic seated therex today- but quickly fatigued and treatment was limited due to stomach issues which he ultimately had to go to bathroom, lay down and unable to continue. Patient continue to benefit from skilled physical therapy intervention in order to improve his strength, mobility, and overall quality of life    Personal Factors and Comorbidities Comorbidity 3+    Comorbidities DM, Liver transplant, ESRD, CAD    Examination-Activity Limitations Carry;Lift;Reach Overhead;Squat;Stairs;Stand;Transfers;Toileting    Examination-Participation Restrictions Cleaning;Community Activity;Driving;Medication Management;Meal Prep;Yard Work    [employer] officer Evolving/Moderate complexity    Rehab Potential Fair    PT Frequency 2x / week    PT Duration 12 weeks    PT Treatment/Interventions ADLs/Self Care Home Management;Cryotherapy;Moist Heat;DME Instruction;Gait training;Stair training;Functional mobility training;Therapeutic activities;Therapeutic exercise;Balance training;Neuromuscular re-education;Patient/family education;Wheelchair mobility training;Manual techniques;Passive range of motion;Dry needling;Energy  conservation;Joint Manipulations    PT Next Visit Plan Progressive Therapeutic exercises, Balance training, transfer/gait training    PT Home Exercise Plan No changes or updates today.    Consulted and Agree with Plan of Care Patient             Patient will benefit from skilled therapeutic intervention in order to improve the following deficits and impairments:  Abnormal gait, Cardiopulmonary status limiting activity, Decreased activity tolerance, Decreased balance, Decreased coordination, Decreased endurance, Decreased mobility, Decreased range of motion, Decreased strength, Difficulty walking, Hypomobility, Impaired flexibility, Impaired UE functional use  Visit Diagnosis: Abnormality of gait and mobility  Difficulty in walking, not elsewhere classified  Muscle weakness (generalized)  Unsteadiness on feet     Problem List Patient Active Problem List   Diagnosis Date Noted   Pancreatic cyst    Verbal auditory hallucination    Early satiety    Feeding tube dysfunction    Generalized weakness 11/26/2020   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[place]) 11/26/2020   Recurrent falls 11/26/2020   Syncope 10/15/2020   Uremia 10/10/2020   ESRD on hemodialysis ([place]) 10/10/2020   Hyperkalemia 10/10/2020   Elevated troponin 10/10/2020   GERD (gastroesophageal reflux disease) 10/10/2020   CAD (coronary artery disease) 10/10/2020   G tube feedings ([place]) 10/10/2020   Diarrhea 10/10/2020   Type II diabetes mellitus with renal manifestations ([place]) 10/10/2020   Weakness    ACS (acute coronary syndrome) ([place])    Liver transplant recipient [facility])    Anemia of chronic disease    Type 2 diabetes mellitus with diabetic neuropathy, with long-term current use of insulin ([place])    ESRD (end stage renal disease) ([place]) 08/19/2020   History of anemia due to chronic kidney disease 08/19/2020   Gait abnormality 10/17/2019   History of fall within past 90 days 10/17/2019   Visual hallucination 10/17/2019    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

## 2021-05-18 ENCOUNTER — Ambulatory Visit: Payer: Medicare Other

## 2021-05-23 ENCOUNTER — Other Ambulatory Visit: Payer: Self-pay

## 2021-05-23 ENCOUNTER — Ambulatory Visit: Payer: Medicare Other | Admitting: Physical Therapy

## 2021-05-23 DIAGNOSIS — R278 Other lack of coordination: Secondary | ICD-10-CM

## 2021-05-23 DIAGNOSIS — R2689 Other abnormalities of gait and mobility: Secondary | ICD-10-CM

## 2021-05-23 DIAGNOSIS — M6281 Muscle weakness (generalized): Secondary | ICD-10-CM | POA: Diagnosis not present

## 2021-05-23 DIAGNOSIS — R296 Repeated falls: Secondary | ICD-10-CM

## 2021-05-23 DIAGNOSIS — R2681 Unsteadiness on feet: Secondary | ICD-10-CM

## 2021-05-23 DIAGNOSIS — R269 Unspecified abnormalities of gait and mobility: Secondary | ICD-10-CM

## 2021-05-23 DIAGNOSIS — R262 Difficulty in walking, not elsewhere classified: Secondary | ICD-10-CM

## 2021-05-23 NOTE — Therapy (Signed)
[place] MAIN [facility] SERVICES [address] [place], [place], [address] Phone: [phone]   Fax:  [phone]  Physical Therapy Treatment  Patient Details  Name: Alex Hampton MRN: [id number] Date of Birth: [birth year] No data recorded  Encounter Date: 05/23/2021   PT End of Session - 05/23/21 1312     Visit Number 17    Number of Visits 40    Date for PT Re-Evaluation 04/27/21    Authorization Type Medicare/BCBS; PN 03/23/21    Authorization Time Period Initial Cert= 56/38/7564- 33/29/5188    Progress Note Due on Visit 20    PT Start Time 1302    PT Stop Time 1325    PT Time Calculation (min) 23 min    Equipment Utilized During Treatment Gait belt    Activity Tolerance Patient limited by fatigue;Patient limited by lethargy;Other (comment)   Patient became nauseous with mobility/testing today   Behavior During Therapy WFL for tasks assessed/performed             Past Medical History:  Diagnosis Date   Depression    History of cardiac cath    History of heart artery stent    Hyperlipidemia    Hypertension    MI (myocardial infarction) ([place])    Renal disorder     Past Surgical History:  Procedure Laterality Date   IR [place] TUBE CHANGE  11/22/2020   IR [place] TUBE CHANGE  11/26/2020   IR [place] GASTRO/COLONIC TUBE PERCUT W/FLUORO  10/14/2020   LEFT HEART CATH AND CORONARY ANGIOGRAPHY N/A 08/19/2020   Procedure: LEFT HEART CATH AND CORONARY ANGIOGRAPHY;  Surgeon: Corey Skains, MD;  Location: [place] [facility];  Service: Cardiovascular;  Laterality: N/A;   LIVER TRANSPLANT      There were no vitals filed for this visit.   Subjective Assessment - 05/23/21 1311     Subjective Patient reports he is feeling weaker than normal today. BP prior to session was 118/70. Denies pain. No falls/LOB.    Patient is accompained by: Family member   [name] (she brought him but did not come back during eval)   Pertinent History Patient is a 66  year old male with recent referral for abnomality of gait and recent liver transplant in November 2021. He has past medical history significant for Liver transplant (02/18/2020), CKD, End stage renal disease- On hemodialyis T/TH/Sat., Anemia, DM with neuropathy, Non-stemi, Coronary artery disease, Feeding tube with poor appetite.    Limitations Standing;Lifting;House hold activities;Walking    How long can you sit comfortably? no issues    How long can you stand comfortably? < 80mn    How long can you walk comfortably? < 2 min    Patient Stated Goals I want to return to my normal self- Independent with all mobility, not fall, with improved overall stamina.               INTERVENTIONS:    Therapeutic Exercises:  Seated LE strengthening exercises:    -Hip march alt LE's 2 x 10 reps each using 2lb AW -Hip flex/abd up and over hedgehog 2 x 10 reps each LE using 2lb AW -Knee ext alt LE's 2 x 10 reps using 2lb. AW -Hamstring curls Alt LE's  x 10 reps. Using blue theraband.   Stand pivot transfers (x2 reps) and bed mobility performed with CGA for safety.    Clinical Impression: Pt arrives to session with son who waits in the lobby. Pt  was able to perform 4 seated LE strengthening exercises prior to stating he did not feel well and needed to lie down. Pt reported mild nausea and significant fatigue. Pt remained lying down for ~5 minutes before stating he could not continue and needed to end the session early. Patient continue to benefit from skilled physical therapy intervention in order to improve his strength, mobility, and overall quality of life.      PT Short Term Goals - 04/29/21 1058       PT SHORT TERM GOAL #1   Title Pt will be independent with HEP in order to improve strength and balance in order to decrease fall risk and improve function at home and work.    Baseline 02/02/2021: Patient reports limited HEP in place currently from [facility] agency 12/14 compliance; 1/20/2023Patient  states no questions regarding his current exercise regimen and states he is compliant.    Time 6    Period Weeks    Status Achieved    Target Date 03/16/21               PT Long Term Goals - 04/29/21 1059       PT LONG TERM GOAL #1   Title Pt will improve FOTO to target score of 50 to display perceived improvements in ability to complete ADL's.    Baseline 02/02/2021= 43 12/143: 51%; 04/29/2021= 53%    Time 12    Period Weeks    Status Achieved      PT LONG TERM GOAL #2   Title Pt will decrease 5TSTS by at least 8 seconds in order to demonstrate clinically significant improvement in LE strength.    Baseline 02/02/2021= 28.22 sec with BUE Support 12/14: 23.1 seconds; 04/29/2021=Unable to test today due to patient became nauseous with 6 min walk test and attempted TUG- unable to continue- will reassess next 1-2 visits    Time 12    Period Weeks    Status Partially Met    Target Date 07/22/21      PT LONG TERM GOAL #3   Title Pt will decrease TUG to below 19 seconds/decrease in order to demonstrate decreased fall risk.    Baseline 02/02/2021= 27.30 sec without AD 12/14: 23 seconds; 04/29/2021- Unable to assess as patient became very nauseated upon standing and request to stop treatment.    Time 12    Period Weeks    Status Partially Met    Target Date 07/22/21      PT LONG TERM GOAL #4   Title Pt will increase 6MWT by at least 28m(1648f in order to demonstrate clinically significant improvement in cardiopulmonary endurance and community ambulation    Baseline 02/02/2021= 83 feet in 1 min 10 sec wihtout an AD 12/14: 200 ft in  33m80mtes; 04/29/2021=Patient ambulated approx 60 feet yet had to stop due ?onset of nausea and unable to complete today    Time 12    Period Weeks    Status Partially Met    Target Date 07/22/21      PT LONG TERM GOAL #5   Title Pt will increase 10MWT by at least 0.2 m/s in order to demonstrate clinically significant improvement in community  ambulation.    Baseline 02/02/2021= 0.42 m/s 12/14: 0.49 m/s; 04/29/2021=Patient unable to test today secondary to being nauseated.    Time 12    Period Weeks    Status Partially Met    Target Date 07/22/21  Plan - 05/23/21 1355     Clinical Impression Statement Pt arrives to session with son who waits in the lobby. Pt was able to perform 4 seated LE strengthening exercises prior to stating he did not feel well and needed to lie down. Pt reported mild nausea and significant fatigue. Pt remained lying down for ~5 minutes before stating he could not continue and needed to end the session early. Patient continue to benefit from skilled physical therapy intervention in order to improve his strength, mobility, and overall quality of life.    Personal Factors and Comorbidities Comorbidity 3+    Comorbidities DM, Liver transplant, ESRD, CAD    Examination-Activity Limitations Carry;Lift;Reach Overhead;Squat;Stairs;Stand;Transfers;Toileting    Examination-Participation Restrictions Cleaning;Community Activity;Driving;Medication Management;Meal Prep;Yard Work    Merchant [employer] officer Evolving/Moderate complexity    Rehab Potential Fair    PT Frequency 2x / week    PT Duration 12 weeks    PT Treatment/Interventions ADLs/Self Care Home Management;Cryotherapy;Moist Heat;DME Instruction;Gait training;Stair training;Functional mobility training;Therapeutic activities;Therapeutic exercise;Balance training;Neuromuscular re-education;Patient/family education;Wheelchair mobility training;Manual techniques;Passive range of motion;Dry needling;Energy conservation;Joint Manipulations    PT Next Visit Plan Progressive Therapeutic exercises, Balance training, transfer/gait training    PT Home Exercise Plan No changes or updates today.    Consulted and Agree with Plan of Care Patient             Patient will benefit from skilled therapeutic intervention in order to improve the  following deficits and impairments:  Abnormal gait, Cardiopulmonary status limiting activity, Decreased activity tolerance, Decreased balance, Decreased coordination, Decreased endurance, Decreased mobility, Decreased range of motion, Decreased strength, Difficulty walking, Hypomobility, Impaired flexibility, Impaired UE functional use  Visit Diagnosis: Abnormality of gait and mobility  Difficulty in walking, not elsewhere classified  Muscle weakness (generalized)  Unsteadiness on feet  Other abnormalities of gait and mobility  Other lack of coordination  Repeated falls     Problem List Patient Active Problem List   Diagnosis Date Noted   Pancreatic cyst    Verbal auditory hallucination    Early satiety    Feeding tube dysfunction    Generalized weakness 11/26/2020   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[place]) 11/26/2020   Recurrent falls 11/26/2020   Syncope 10/15/2020   Uremia 10/10/2020   ESRD on hemodialysis ([facility]) 10/10/2020   Hyperkalemia 10/10/2020   Elevated troponin 10/10/2020   GERD (gastroesophageal reflux disease) 10/10/2020   CAD (coronary artery disease) 10/10/2020   G tube feedings ([place]) 10/10/2020   Diarrhea 10/10/2020   Type II diabetes mellitus with renal manifestations ([facility]) 10/10/2020   Weakness    ACS (acute coronary syndrome) ([place])    Liver transplant recipient [facility])    Anemia of chronic disease    Type 2 diabetes mellitus with diabetic neuropathy, with long-term current use of insulin ([place])    ESRD (end stage renal disease) ([place]) 08/19/2020   History of anemia due to chronic kidney disease 08/19/2020   Gait abnormality 10/17/2019   History of fall within past 90 days 10/17/2019   Visual hallucination 10/17/2019   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

## 2021-05-25 ENCOUNTER — Ambulatory Visit: Payer: Medicare Other

## 2021-05-30 ENCOUNTER — Ambulatory Visit: Payer: Medicare Other

## 2021-05-30 ENCOUNTER — Other Ambulatory Visit: Payer: Self-pay

## 2021-05-30 DIAGNOSIS — M6281 Muscle weakness (generalized): Secondary | ICD-10-CM | POA: Diagnosis not present

## 2021-05-30 DIAGNOSIS — R262 Difficulty in walking, not elsewhere classified: Secondary | ICD-10-CM

## 2021-05-30 DIAGNOSIS — R2681 Unsteadiness on feet: Secondary | ICD-10-CM

## 2021-05-30 DIAGNOSIS — R269 Unspecified abnormalities of gait and mobility: Secondary | ICD-10-CM

## 2021-05-30 NOTE — Therapy (Signed)
[place] MAIN [facility] SERVICES [address] [place], [place], [address] Phone: [phone]   Fax:  [phone]  Physical Therapy Treatment  Patient Details  Name: Alex Hampton MRN: [id number] Date of Birth: 05-09-1955 No data recorded  Encounter Date: 05/30/2021   PT End of Session - 05/30/21 1256     Visit Number 18    Number of Visits 40    Date for PT Re-Evaluation 07/22/21    Authorization Type Medicare/BCBS; PN 03/23/21    Authorization Time Period Initial Cert= 73/42/8768- 11/57/2620; Recert 3/55/9741-6/38/4536    Progress Note Due on Visit 20    PT Start Time 1258    PT Stop Time 1329    PT Time Calculation (min) 31 min    Equipment Utilized During Treatment Gait belt    Activity Tolerance Patient limited by fatigue;Patient limited by lethargy;Other (comment)   Patient became nauseous with mobility/testing today   Behavior During Therapy WFL for tasks assessed/performed             Past Medical History:  Diagnosis Date   Depression    History of cardiac cath    History of heart artery stent    Hyperlipidemia    Hypertension    MI (myocardial infarction) ([place])    Renal disorder     Past Surgical History:  Procedure Laterality Date   IR GJ TUBE CHANGE  11/22/2020   IR [place] TUBE CHANGE  11/26/2020   IR [place] GASTRO/COLONIC TUBE PERCUT W/FLUORO  10/14/2020   LEFT HEART CATH AND CORONARY ANGIOGRAPHY N/A 08/19/2020   Procedure: LEFT HEART CATH AND CORONARY ANGIOGRAPHY;  Surgeon: Corey Skains, MD;  Location: [place] [facility];  Service: Cardiovascular;  Laterality: N/A;   LIVER TRANSPLANT      There were no vitals filed for this visit.   Subjective Assessment - 05/30/21 1255     Subjective Patient reports doing some better today- states still very weak but not quite as nauseated today.    Patient is accompained by: Family member   [name] (she brought him but did not come back during eval)   Pertinent History  Patient is a 66 year old male with recent referral for abnomality of gait and recent liver transplant in November 2021. He has past medical history significant for Liver transplant (02/18/2020), CKD, End stage renal disease- On hemodialyis T/TH/Sat., Anemia, DM with neuropathy, Non-stemi, Coronary artery disease, Feeding tube with poor appetite.    Limitations Standing;Lifting;House hold activities;Walking    How long can you sit comfortably? no issues    How long can you stand comfortably? < 65mn    How long can you walk comfortably? < 2 min    Patient Stated Goals I want to return to my normal self- Independent with all mobility, not fall, with improved overall stamina.    Currently in Pain? No/denies             INTERVENTIONS:   BP (supine) = 131/81 mmHg  Supine therex: performed with 2.5 lb ankle weight x 10 reps each leg  SAQ BLE - 10 reps each Heel Slides- 10 reps each with slide board Hip abd- 10 reps with slide board Straight leg raises- x 10 reps each leg Bridges x 10 reps  Knee to chest x 10 reps each *patient reported increased fatigue overall- but no worse with nausea.    Ambulation - walking without an AD for endurance- approx 75 feet today- Stopped secondary  to undo fatigue. Patient denied any worsening of nausea but reports being exhausted after workout.   *Patient limited session to 30 min secondary to stating he had to leave to make it to a telehealth visit with MD.   Education provided throughout session via VC/TC and demonstration to facilitate movement at target joints and correct muscle activation for all testing and exercises performed.                         PT Education - 05/30/21 1255     Education Details Exercise technique    Person(s) Educated Patient    Methods Explanation;Demonstration;Tactile cues;Verbal cues    Comprehension Verbalized understanding;Returned demonstration;Tactile cues required;Verbal cues required;Need  further instruction              PT Short Term Goals - 04/29/21 1058       PT SHORT TERM GOAL #1   Title Pt will be independent with HEP in order to improve strength and balance in order to decrease fall risk and improve function at home and work.    Baseline 02/02/2021: Patient reports limited HEP in place currently from [facility] agency 12/14 compliance; 1/20/2023Patient states no questions regarding his current exercise regimen and states he is compliant.    Time 6    Period Weeks    Status Achieved    Target Date 03/16/21               PT Long Term Goals - 04/29/21 1059       PT LONG TERM GOAL #1   Title Pt will improve FOTO to target score of 50 to display perceived improvements in ability to complete ADL's.    Baseline 02/02/2021= 43 12/143: 51%; 04/29/2021= 53%    Time 12    Period Weeks    Status Achieved      PT LONG TERM GOAL #2   Title Pt will decrease 5TSTS by at least 8 seconds in order to demonstrate clinically significant improvement in LE strength.    Baseline 02/02/2021= 28.22 sec with BUE Support 12/14: 23.1 seconds; 04/29/2021=Unable to test today due to patient became nauseous with 6 min walk test and attempted TUG- unable to continue- will reassess next 1-2 visits    Time 12    Period Weeks    Status Partially Met    Target Date 07/22/21      PT LONG TERM GOAL #3   Title Pt will decrease TUG to below 19 seconds/decrease in order to demonstrate decreased fall risk.    Baseline 02/02/2021= 27.30 sec without AD 12/14: 23 seconds; 04/29/2021- Unable to assess as patient became very nauseated upon standing and request to stop treatment.    Time 12    Period Weeks    Status Partially Met    Target Date 07/22/21      PT LONG TERM GOAL #4   Title Pt will increase 6MWT by at least 59m(1619f in order to demonstrate clinically significant improvement in cardiopulmonary endurance and community ambulation    Baseline 02/02/2021= 83 feet in 1 min 10 sec wihtout an  AD 12/14: 200 ft in  80m87mtes; 04/29/2021=Patient ambulated approx 60 feet yet had to stop due ?onset of nausea and unable to complete today    Time 12    Period Weeks    Status Partially Met    Target Date 07/22/21      PT LONG TERM GOAL #5   Title Pt  will increase 10MWT by at least 0.2 m/s in order to demonstrate clinically significant improvement in community ambulation.    Baseline 02/02/2021= 0.42 m/s 12/14: 0.49 m/s; 04/29/2021=Patient unable to test today secondary to being nauseated.    Time 12    Period Weeks    Status Partially Met    Target Date 07/22/21                   Plan - 05/30/21 1257     Clinical Impression Statement Patient presents with good motivation- reporting nauseated today but not as much as previous session. Started session in supine to try to see if he would be able to perform more today. He did indeed perform more LE strengthening today from supine position and in supine position- did not report worsening of nausea. He ended with walking today and fatigued quickly but stated he has not been walking much in past few weeks. Patient will continue to benefit from skilled physical therapy intervention in order to improve his strength, mobility, and overall quality of life.    Personal Factors and Comorbidities Comorbidity 3+    Comorbidities DM, Liver transplant, ESRD, CAD    Examination-Activity Limitations Carry;Lift;Reach Overhead;Squat;Stairs;Stand;Transfers;Toileting    Examination-Participation Restrictions Cleaning;Community Activity;Driving;Medication Management;Meal Prep;Yard Work    [employer] officer Evolving/Moderate complexity    Rehab Potential Fair    PT Frequency 2x / week    PT Duration 12 weeks    PT Treatment/Interventions ADLs/Self Care Home Management;Cryotherapy;Moist Heat;DME Instruction;Gait training;Stair training;Functional mobility training;Therapeutic activities;Therapeutic exercise;Balance training;Neuromuscular  re-education;Patient/family education;Wheelchair mobility training;Manual techniques;Passive range of motion;Dry needling;Energy conservation;Joint Manipulations    PT Next Visit Plan Progressive Therapeutic exercises, Balance training, transfer/gait training    PT Home Exercise Plan No changes or updates today.    Consulted and Agree with Plan of Care Patient             Patient will benefit from skilled therapeutic intervention in order to improve the following deficits and impairments:  Abnormal gait, Cardiopulmonary status limiting activity, Decreased activity tolerance, Decreased balance, Decreased coordination, Decreased endurance, Decreased mobility, Decreased range of motion, Decreased strength, Difficulty walking, Hypomobility, Impaired flexibility, Impaired UE functional use  Visit Diagnosis: Abnormality of gait and mobility  Difficulty in walking, not elsewhere classified  Muscle weakness (generalized)  Unsteadiness on feet     Problem List Patient Active Problem List   Diagnosis Date Noted   Pancreatic cyst    Verbal auditory hallucination    Early satiety    Feeding tube dysfunction    Generalized weakness 11/26/2020   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[place]) 11/26/2020   Recurrent falls 11/26/2020   Syncope 10/15/2020   Uremia 10/10/2020   ESRD on hemodialysis ([place]) 10/10/2020   Hyperkalemia 10/10/2020   Elevated troponin 10/10/2020   GERD (gastroesophageal reflux disease) 10/10/2020   CAD (coronary artery disease) 10/10/2020   G tube feedings ([place]) 10/10/2020   Diarrhea 10/10/2020   Type II diabetes mellitus with renal manifestations ([place]) 10/10/2020   Weakness    ACS (acute coronary syndrome) ([place])    Liver transplant recipient [facility])    Anemia of chronic disease    Type 2 diabetes mellitus with diabetic neuropathy, with long-term current use of insulin ([place])    ESRD (end stage renal disease) ([place]) 08/19/2020   History of anemia due to chronic kidney  disease 08/19/2020   Gait abnormality 10/17/2019   History of fall within past 90 days 10/17/2019   Visual hallucination 10/17/2019   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

## 2021-06-01 ENCOUNTER — Ambulatory Visit: Payer: Medicare Other

## 2021-06-01 ENCOUNTER — Other Ambulatory Visit: Payer: Self-pay

## 2021-06-01 DIAGNOSIS — R262 Difficulty in walking, not elsewhere classified: Secondary | ICD-10-CM

## 2021-06-01 DIAGNOSIS — M6281 Muscle weakness (generalized): Secondary | ICD-10-CM | POA: Diagnosis not present

## 2021-06-01 DIAGNOSIS — R269 Unspecified abnormalities of gait and mobility: Secondary | ICD-10-CM

## 2021-06-01 DIAGNOSIS — R2681 Unsteadiness on feet: Secondary | ICD-10-CM

## 2021-06-01 NOTE — Therapy (Signed)
[place] MAIN [facility] SERVICES [address] [place], [place], [address] Phone: [phone]   Fax:  [phone]  Physical Therapy Treatment  Patient Details  Name: Alex Hampton MRN: [id number] Date of Birth: Jul 04, 1955 No data recorded  Encounter Date: 06/01/2021   PT End of Session - 06/01/21 1259     Visit Number 19    Number of Visits 40    Date for PT Re-Evaluation 07/22/21    Authorization Type Medicare/BCBS; PN 03/23/21    Authorization Time Period Initial Cert= 41/28/7867- 67/20/9470; Recert 9/62/8366-2/94/7654    Progress Note Due on Visit 20    PT Start Time 1300    PT Stop Time 1343    PT Time Calculation (min) 43 min    Equipment Utilized During Treatment Gait belt    Activity Tolerance Patient limited by fatigue;Patient limited by lethargy;Other (comment)   Patient became nauseous with mobility/testing today   Behavior During Therapy WFL for tasks assessed/performed             Past Medical History:  Diagnosis Date   Depression    History of cardiac cath    History of heart artery stent    Hyperlipidemia    Hypertension    MI (myocardial infarction) ([place])    Renal disorder     Past Surgical History:  Procedure Laterality Date   IR GJ TUBE CHANGE  11/22/2020   IR [place] TUBE CHANGE  11/26/2020   IR [place] GASTRO/COLONIC TUBE PERCUT W/FLUORO  10/14/2020   LEFT HEART CATH AND CORONARY ANGIOGRAPHY N/A 08/19/2020   Procedure: LEFT HEART CATH AND CORONARY ANGIOGRAPHY;  Surgeon: Corey Skains, MD;  Location: [place] [facility];  Service: Cardiovascular;  Laterality: N/A;   LIVER TRANSPLANT      There were no vitals filed for this visit.   Subjective Assessment - 06/01/21 1259     Subjective Patient reports feeling about the same as last visit- Slightly nauseated and weak/tired feeling.    Patient is accompained by: Family member   [name] (she brought him but did not come back during eval)   Pertinent History  Patient is a 66 year old male with recent referral for abnomality of gait and recent liver transplant in November 2021. He has past medical history significant for Liver transplant (02/18/2020), CKD, End stage renal disease- On hemodialyis T/TH/Sat., Anemia, DM with neuropathy, Non-stemi, Coronary artery disease, Feeding tube with poor appetite.    Limitations Standing;Lifting;House hold activities;Walking    How long can you sit comfortably? no issues    How long can you stand comfortably? < 18mn    How long can you walk comfortably? < 2 min    Patient Stated Goals I want to return to my normal self- Independent with all mobility, not fall, with improved overall stamina.    Currently in Pain? No/denies             INTERVENTIONS:       INTERVENTIONS:     Supine therex: performed with 3 lb ankle weight x 10 reps each leg   SAQ BLE - 10 reps each Heel Slides- 10 reps each with slide board Hip abd- 10 reps with no weight and yet no slide board Straight leg raises up and over cone- x 10 reps each leg Bridges x 10 reps  Quad sets 3 sec hold x 10 reps each Gluteal sets 3 sec hold x 10 reps each Hip add with ball squeeze -  3 sec hold x 10 reps each *patient reported increased fatigue overall- but no worse with nausea.      Ambulation - walking without an AD for endurance- 150 feet today- Patient required to rest afterward- laying down. Patient denied any worsening of nausea but reports being exhausted after workout.                          PT Education - 06/01/21 1259     Education Details Exercise technique    Person(s) Educated Patient    Methods Explanation;Demonstration;Tactile cues;Verbal cues    Comprehension Verbalized understanding;Returned demonstration;Verbal cues required;Tactile cues required;Need further instruction              PT Short Term Goals - 04/29/21 1058       PT SHORT TERM GOAL #1   Title Pt will be independent with HEP in  order to improve strength and balance in order to decrease fall risk and improve function at home and work.    Baseline 02/02/2021: Patient reports limited HEP in place currently from [facility] agency 12/14 compliance; 1/20/2023Patient states no questions regarding his current exercise regimen and states he is compliant.    Time 6    Period Weeks    Status Achieved    Target Date 03/16/21               PT Long Term Goals - 04/29/21 1059       PT LONG TERM GOAL #1   Title Pt will improve FOTO to target score of 50 to display perceived improvements in ability to complete ADL's.    Baseline 02/02/2021= 43 12/143: 51%; 04/29/2021= 53%    Time 12    Period Weeks    Status Achieved      PT LONG TERM GOAL #2   Title Pt will decrease 5TSTS by at least 8 seconds in order to demonstrate clinically significant improvement in LE strength.    Baseline 02/02/2021= 28.22 sec with BUE Support 12/14: 23.1 seconds; 04/29/2021=Unable to test today due to patient became nauseous with 6 min walk test and attempted TUG- unable to continue- will reassess next 1-2 visits    Time 12    Period Weeks    Status Partially Met    Target Date 07/22/21      PT LONG TERM GOAL #3   Title Pt will decrease TUG to below 19 seconds/decrease in order to demonstrate decreased fall risk.    Baseline 02/02/2021= 27.30 sec without AD 12/14: 23 seconds; 04/29/2021- Unable to assess as patient became very nauseated upon standing and request to stop treatment.    Time 12    Period Weeks    Status Partially Met    Target Date 07/22/21      PT LONG TERM GOAL #4   Title Pt will increase 6MWT by at least 54m(1646f in order to demonstrate clinically significant improvement in cardiopulmonary endurance and community ambulation    Baseline 02/02/2021= 83 feet in 1 min 10 sec wihtout an AD 12/14: 200 ft in  12m112mtes; 04/29/2021=Patient ambulated approx 60 feet yet had to stop due ?onset of nausea and unable to complete today    Time 12     Period Weeks    Status Partially Met    Target Date 07/22/21      PT LONG TERM GOAL #5   Title Pt will increase 10MWT by at least 0.2 m/s in order to demonstrate  clinically significant improvement in community ambulation.    Baseline 02/02/2021= 0.42 m/s 12/14: 0.49 m/s; 04/29/2021=Patient unable to test today secondary to being nauseated.    Time 12    Period Weeks    Status Partially Met    Target Date 07/22/21                   Plan - 06/01/21 1259     Clinical Impression Statement Patient presents with good motivation and able to participate slightly more today including more active Motion with LE and improved ambulation distance. He remains very limited overall and fatigues with minimal exertion. Patient will continue to benefit from skilled physical therapy intervention in order to improve his strength, mobility, and overall quality of life.    Personal Factors and Comorbidities Comorbidity 3+    Comorbidities DM, Liver transplant, ESRD, CAD    Examination-Activity Limitations Carry;Lift;Reach Overhead;Squat;Stairs;Stand;Transfers;Toileting    Examination-Participation Restrictions Cleaning;Community Activity;Driving;Medication Management;Meal Prep;Yard Work    [employer] officer Evolving/Moderate complexity    Rehab Potential Fair    PT Frequency 2x / week    PT Duration 12 weeks    PT Treatment/Interventions ADLs/Self Care Home Management;Cryotherapy;Moist Heat;DME Instruction;Gait training;Stair training;Functional mobility training;Therapeutic activities;Therapeutic exercise;Balance training;Neuromuscular re-education;Patient/family education;Wheelchair mobility training;Manual techniques;Passive range of motion;Dry needling;Energy conservation;Joint Manipulations    PT Next Visit Plan Progressive Therapeutic exercises, Balance training, transfer/gait training    PT Home Exercise Plan No changes or updates today.    Consulted and Agree with Plan of  Care Patient             Patient will benefit from skilled therapeutic intervention in order to improve the following deficits and impairments:  Abnormal gait, Cardiopulmonary status limiting activity, Decreased activity tolerance, Decreased balance, Decreased coordination, Decreased endurance, Decreased mobility, Decreased range of motion, Decreased strength, Difficulty walking, Hypomobility, Impaired flexibility, Impaired UE functional use  Visit Diagnosis: Muscle weakness (generalized)  Difficulty in walking, not elsewhere classified  Unsteadiness on feet  Abnormality of gait and mobility     Problem List Patient Active Problem List   Diagnosis Date Noted   Pancreatic cyst    Verbal auditory hallucination    Early satiety    Feeding tube dysfunction    Generalized weakness 11/26/2020   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[place]) 11/26/2020   Recurrent falls 11/26/2020   Syncope 10/15/2020   Uremia 10/10/2020   ESRD on hemodialysis ([place]) 10/10/2020   Hyperkalemia 10/10/2020   Elevated troponin 10/10/2020   GERD (gastroesophageal reflux disease) 10/10/2020   CAD (coronary artery disease) 10/10/2020   G tube feedings ([place]) 10/10/2020   Diarrhea 10/10/2020   Type II diabetes mellitus with renal manifestations ([place]) 10/10/2020   Weakness    ACS (acute coronary syndrome) ([place])    Liver transplant recipient [facility])    Anemia of chronic disease    Type 2 diabetes mellitus with diabetic neuropathy, with long-term current use of insulin ([place])    ESRD (end stage renal disease) ([place]) 08/19/2020   History of anemia due to chronic kidney disease 08/19/2020   Gait abnormality 10/17/2019   History of fall within past 90 days 10/17/2019   Visual hallucination 10/17/2019   Cirrhosis of liver with ascites ([place]) on CT 10/17/2019   HTN (hypertension) 10/17/2019   History of MI (myocardial infarction) 10/17/2019   OSA (obstructive sleep apnea) 10/17/2019   Nondisplaced comminuted  supracondylar fracture without intercondylar fracture of left humerus, subsequent encounter for fracture with nonunion 10/17/2019   Anxiety and depression 10/17/2019  Lewis Moccasin, PT 06/02/2021, 1:22 PM  [place] MAIN [facility] SERVICES [address] [place], [place], [address] Phone: [phone]   Fax:  [phone]  Name: NAKSH RADI MRN: [id number] Date of Birth: 1955/12/18

## 2021-06-06 ENCOUNTER — Ambulatory Visit: Payer: Medicare Other

## 2021-06-08 ENCOUNTER — Other Ambulatory Visit: Payer: Self-pay

## 2021-06-08 ENCOUNTER — Ambulatory Visit: Payer: Medicare Other | Attending: Gastroenterology

## 2021-06-08 DIAGNOSIS — R269 Unspecified abnormalities of gait and mobility: Secondary | ICD-10-CM | POA: Insufficient documentation

## 2021-06-08 DIAGNOSIS — R2689 Other abnormalities of gait and mobility: Secondary | ICD-10-CM | POA: Insufficient documentation

## 2021-06-08 DIAGNOSIS — R262 Difficulty in walking, not elsewhere classified: Secondary | ICD-10-CM | POA: Insufficient documentation

## 2021-06-08 DIAGNOSIS — R278 Other lack of coordination: Secondary | ICD-10-CM | POA: Insufficient documentation

## 2021-06-08 DIAGNOSIS — M6281 Muscle weakness (generalized): Secondary | ICD-10-CM | POA: Diagnosis not present

## 2021-06-08 DIAGNOSIS — R2681 Unsteadiness on feet: Secondary | ICD-10-CM | POA: Insufficient documentation

## 2021-06-08 DIAGNOSIS — R296 Repeated falls: Secondary | ICD-10-CM | POA: Insufficient documentation

## 2021-06-08 NOTE — Therapy (Signed)
[place] MAIN [facility] SERVICES [address] [place], [place], [address] Phone: [phone]   Fax:  [phone]  Physical Therapy Treatment/Physical Therapy Progress Note   Dates of reporting period  03/23/2021  to   06/08/2021  Patient Details  Name: GENNIE DIB MRN: [id number] Date of Birth: [birth year] No data recorded  Encounter Date: 06/08/2021   PT End of Session - 06/08/21 1301     Visit Number 20    Number of Visits 40    Date for PT Re-Evaluation 07/22/21    Authorization Type Medicare/BCBS; PN 03/23/21    Authorization Time Period Initial Cert= 29/52/8413- 24/40/1027; Recert 2/53/6644-0/34/7425    Progress Note Due on Visit 20    PT Start Time 1300    PT Stop Time 1340    PT Time Calculation (min) 40 min    Equipment Utilized During Treatment Gait belt    Activity Tolerance Patient limited by fatigue;Patient limited by lethargy;Other (comment)   Patient became nauseous with mobility/testing today   Behavior During Therapy WFL for tasks assessed/performed             Past Medical History:  Diagnosis Date   Depression    History of cardiac cath    History of heart artery stent    Hyperlipidemia    Hypertension    MI (myocardial infarction) ([place])    Renal disorder     Past Surgical History:  Procedure Laterality Date   IR [place] TUBE CHANGE  11/22/2020   IR [place] TUBE CHANGE  11/26/2020   IR [place] GASTRO/COLONIC TUBE PERCUT W/FLUORO  10/14/2020   LEFT HEART CATH AND CORONARY ANGIOGRAPHY N/A 08/19/2020   Procedure: LEFT HEART CATH AND CORONARY ANGIOGRAPHY;  Surgeon: Corey Skains, MD;  Location: [place] [facility];  Service: Cardiovascular;  Laterality: N/A;   LIVER TRANSPLANT      There were no vitals filed for this visit.   Subjective Assessment - 06/08/21 1300     Subjective Patient reports better overall but still dealing with some weakness and nausea.    Patient is accompained by: Family member   [name] (she  brought him but did not come back during eval)   Pertinent History Patient is a 66 year old male with recent referral for abnomality of gait and recent liver transplant in November 2021. He has past medical history significant for Liver transplant (02/18/2020), CKD, End stage renal disease- On hemodialyis T/TH/Sat., Anemia, DM with neuropathy, Non-stemi, Coronary artery disease, Feeding tube with poor appetite.    Limitations Standing;Lifting;House hold activities;Walking    How long can you sit comfortably? no issues    How long can you stand comfortably? < 65mn    How long can you walk comfortably? < 2 min    Patient Stated Goals I want to return to my normal self- Independent with all mobility, not fall, with improved overall stamina.    Currently in Pain? No/denies             INTERVENTIONS:   Reassessed goals for progress note today.  Long term goal:   5 x STS= 25.1 sec with min UE Support  TUG= 19.10 sec without an AD  10 MWT= 0.62 m/s   Therex:   Patient instructed in the following for HEP on non-dialysis days:  Seated hip flex Seated Knee ext Seated hip abd Seated hip add squeeze with ball Seated hee/toe raises 10 reps each LE  Education provided throughout session  via VC/TC and demonstration to facilitate movement at target joints and correct muscle activation for all testing and exercises performed.    Access Code: [email or web address] URL: [email or web address] Date: 06/08/2021 Prepared by: Sande Brothers  Exercises Seated Hip Flexion March with Ankle Weights - 1 x daily - 2 x weekly - 3 sets - 10 reps - 2 hold Seated Long Arc Quad with Ankle Weight - 1 x daily - 2 x weekly - 3 sets - 10 reps - 2 hold Seated Hip Abduction - 1 x daily - 2 x weekly - 3 sets - 10 reps - 2 hold Seated Hip Adduction Squeeze with Ball - 1 x daily - 2 x weekly - 3 sets - 10 reps - 2 hold Seated Heel Toe Raises - 1 x daily - 2 x weekly - 3 sets - 10 reps - 2 hold                              PT Education - 06/08/21 1300     Education Details Exercise technique    Person(s) Educated Patient    Methods Explanation;Demonstration;Tactile cues;Verbal cues    Comprehension Verbalized understanding;Returned demonstration;Verbal cues required;Tactile cues required;Need further instruction              PT Short Term Goals - 04/29/21 1058       PT SHORT TERM GOAL #1   Title Pt will be independent with HEP in order to improve strength and balance in order to decrease fall risk and improve function at home and work.    Baseline 02/02/2021: Patient reports limited HEP in place currently from [facility] agency 12/14 compliance; 1/20/2023Patient states no questions regarding his current exercise regimen and states he is compliant.    Time 6    Period Weeks    Status Achieved    Target Date 03/16/21               PT Long Term Goals - 06/08/21 1511       PT LONG TERM GOAL #1   Title Pt will improve FOTO to target score of 50 to display perceived improvements in ability to complete ADL's.    Baseline 02/02/2021= 43 12/143: 51%; 04/29/2021= 53%    Time 12    Period Weeks    Status Achieved      PT LONG TERM GOAL #2   Title Pt will decrease 5TSTS by at least 8 seconds in order to demonstrate clinically significant improvement in LE strength.    Baseline 02/02/2021= 28.22 sec with BUE Support 12/14: 23.1 seconds; 04/29/2021=Unable to test today due to patient became nauseous with 6 min walk test and attempted TUG- unable to continue- will reassess next 1-2 visits; 06/08/2021= 25.1 sec with min BUE support    Time 12    Period Weeks    Status Partially Met    Target Date 07/22/21      PT LONG TERM GOAL #3   Title Pt will decrease TUG to below 19 seconds/decrease in order to demonstrate decreased fall risk.    Baseline 02/02/2021= 27.30 sec without AD 12/14: 23 seconds; 04/29/2021- Unable to assess as patient became very nauseated upon standing  and request to stop treatment. 06/08/2021= 19.10 sec without an AD    Time 12    Period Weeks    Status Partially Met    Target Date 07/22/21      PT LONG TERM GOAL #4  Title Pt will increase 6MWT by at least 109m(1628f in order to demonstrate clinically significant improvement in cardiopulmonary endurance and community ambulation    Baseline 02/02/2021= 83 feet in 1 min 10 sec wihtout an AD 12/14: 200 ft in  67m26mtes; 04/29/2021=Patient ambulated approx 60 feet yet had to stop due ?onset of nausea and unable to complete today    Time 12    Period Weeks    Status Partially Met    Target Date 07/22/21      PT LONG TERM GOAL #5   Title Pt will increase 10MWT by at least 0.2 m/s in order to demonstrate clinically significant improvement in community ambulation.    Baseline 02/02/2021= 0.42 m/s 12/14: 0.49 m/s; 04/29/2021=Patient unable to test today secondary to being nauseated. 06/08/2021= 0.62 m/s    Time 12    Period Weeks    Status Partially Met    Target Date 07/22/21                   Plan - 06/08/21 1301     Clinical Impression Statement Patient presents with less overall nausea but continued per his report. He was able to perform some functional outcome testing as well as instruction in seated LE strengthening. He is demonstrating progress overall including TUG, 10 MWT for improved overall mobility. Did not test his 6 min walk test due to fatigue.  Patient's condition has the potential to improve in response to therapy. Maximum improvement is yet to be obtained. The anticipated improvement is attainable and reasonable in a generally predictable time. Pt will benefit from skilled PT services to address deficits in balance, mobility and QOL while decreasing risk of future falls    Personal Factors and Comorbidities Comorbidity 3+    Comorbidities DM, Liver transplant, ESRD, CAD    Examination-Activity Limitations Carry;Lift;Reach Overhead;Squat;Stairs;Stand;Transfers;Toileting     Examination-Participation Restrictions Cleaning;Community Activity;Driving;Medication Management;Meal Prep;Yard Work    StaMerchant navy officerolving/Moderate complexity    Rehab Potential Fair    PT Frequency 2x / week    PT Duration 12 weeks    PT Treatment/Interventions ADLs/Self Care Home Management;Cryotherapy;Moist Heat;DME Instruction;Gait training;Stair training;Functional mobility training;Therapeutic activities;Therapeutic exercise;Balance training;Neuromuscular re-education;Patient/family education;Wheelchair mobility training;Manual techniques;Passive range of motion;Dry needling;Energy conservation;Joint Manipulations    PT Next Visit Plan Progressive Therapeutic exercises, Balance training, transfer/gait training    PT Home Exercise Plan No changes or updates today.    Consulted and Agree with Plan of Care Patient             Patient will benefit from skilled therapeutic intervention in order to improve the following deficits and impairments:  Abnormal gait, Cardiopulmonary status limiting activity, Decreased activity tolerance, Decreased balance, Decreased coordination, Decreased endurance, Decreased mobility, Decreased range of motion, Decreased strength, Difficulty walking, Hypomobility, Impaired flexibility, Impaired UE functional use  Visit Diagnosis: Muscle weakness (generalized)  Difficulty in walking, not elsewhere classified  Abnormality of gait and mobility  Unsteadiness on feet     Problem List Patient Active Problem List   Diagnosis Date Noted   Pancreatic cyst    Verbal auditory hallucination    Early satiety    Feeding tube dysfunction    Generalized weakness 11/26/2020   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[place]/19/2022   Recurrent falls 11/26/2020   Syncope 10/15/2020   Uremia 10/10/2020   ESRD on hemodialysis ([facility]/06/2020   Hyperkalemia 10/10/2020   Elevated troponin 10/10/2020   GERD (gastroesophageal reflux disease)  10/10/2020   CAD (coronary artery disease)  10/10/2020   G tube feedings ([place]) 10/10/2020   Diarrhea 10/10/2020   Type II diabetes mellitus with renal manifestations ([place]) 10/10/2020   Weakness    ACS (acute coronary syndrome) [facility])    Liver transplant recipient [facility])    Anemia of chronic disease    Type 2 diabetes mellitus with diabetic neuropathy, with long-term current use of insulin ([place])    ESRD (end stage renal disease) ([place]) 08/19/2020   History of anemia due to chronic kidney disease 08/19/2020   Gait abnormality 10/17/2019   History of fall within past 90 days 10/17/2019   Visual hallucination 10/17/2019   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 [place], [place], [address] Phone: [phone]   Fax:  [phone]  Name: DANTRELL SCHERTZER MRN: [id number] Date of Birth: 1955-06-03

## 2021-06-13 ENCOUNTER — Other Ambulatory Visit: Payer: Self-pay

## 2021-06-13 ENCOUNTER — Ambulatory Visit: Payer: Medicare Other

## 2021-06-13 DIAGNOSIS — R2689 Other abnormalities of gait and mobility: Secondary | ICD-10-CM

## 2021-06-13 DIAGNOSIS — M6281 Muscle weakness (generalized): Secondary | ICD-10-CM | POA: Diagnosis not present

## 2021-06-13 DIAGNOSIS — R262 Difficulty in walking, not elsewhere classified: Secondary | ICD-10-CM

## 2021-06-13 DIAGNOSIS — R2681 Unsteadiness on feet: Secondary | ICD-10-CM

## 2021-06-13 NOTE — Therapy (Signed)
[place] MAIN [facility] SERVICES [address] [place], [place], [address] Phone: [phone]   Fax:  [phone]  Physical Therapy Treatment  Patient Details  Name: Alex Hampton MRN: [id number] Date of Birth: 06/29/1955 No data recorded  Encounter Date: 06/13/2021   PT End of Session - 06/13/21 0827     Visit Number 21    Number of Visits 40    Date for PT Re-Evaluation 07/22/21    Authorization Type Medicare/BCBS; PN 03/23/21    Authorization Time Period Initial Cert= 21/22/4825- 00/37/0488; Recert 8/91/6945-0/38/8828    Progress Note Due on Visit 20    PT Start Time 1300    PT Stop Time 1332    PT Time Calculation (min) 32 min    Equipment Utilized During Treatment Gait belt    Activity Tolerance Patient limited by fatigue;Patient limited by lethargy;Other (comment)   Patient became nauseous with mobility/testing today   Behavior During Therapy WFL for tasks assessed/performed             Past Medical History:  Diagnosis Date   Depression    History of cardiac cath    History of heart artery stent    Hyperlipidemia    Hypertension    MI (myocardial infarction) ([place])    Renal disorder     Past Surgical History:  Procedure Laterality Date   IR [place] TUBE CHANGE  11/22/2020   IR [place] TUBE CHANGE  11/26/2020   IR [place] GASTRO/COLONIC TUBE PERCUT W/FLUORO  10/14/2020   LEFT HEART CATH AND CORONARY ANGIOGRAPHY N/A 08/19/2020   Procedure: LEFT HEART CATH AND CORONARY ANGIOGRAPHY;  Surgeon: Corey Skains, MD;  Location: [place] [facility];  Service: Cardiovascular;  Laterality: N/A;   LIVER TRANSPLANT      There were no vitals filed for this visit.   Subjective Assessment - 06/13/21 0825     Subjective Patient reports not feeling well today- Nauseous and feeling really weak. States he wants to do what he can as far as exercises.    Patient is accompained by: Family member   [name] (she brought him but did not come back during  eval)   Pertinent History Patient is a 66 year old male with recent referral for abnomality of gait and recent liver transplant in November 2021. He has past medical history significant for Liver transplant (02/18/2020), CKD, End stage renal disease- On hemodialyis T/TH/Sat., Anemia, DM with neuropathy, Non-stemi, Coronary artery disease, Feeding tube with poor appetite.    Limitations Standing;Lifting;House hold activities;Walking    How long can you sit comfortably? no issues    How long can you stand comfortably? < 64mn    How long can you walk comfortably? < 2 min    Patient Stated Goals I want to return to my normal self- Independent with all mobility, not fall, with improved overall stamina.    Currently in Pain? No/denies              INTERVENTIONS:   Therex  Seated hip march- Tap onto top of cone x 10 reps each leg   Hip flex/abd (straight leg) seated - lifting heel up and over cone x 10 reps B LE   seated hamstring curl B LE using GTB- 12 reps   In // bars - forward stepping over  positioned 3 bands of TB placed on floor- working on step length- length of bars x 2  In // bars- Side stepping over TB (  4 pieces on floor) Spread out for wider step- Min UE support.  X length of bars x 2  Seated Toe raises x 10 reps BLE Seated heel raises x 10 reps BLE Seated hip add squeeze with ball - 3 sec hold x 10 reps  Scap row with RTB 2 sets of 10 reps    Sit to stand with minimal UE support x 5 reps each.   Resistive Hip march- RTB- BLE x 10 reps  Education provided throughout session via VC/TC and demonstration to facilitate movement at target joints and correct muscle activation for all testing and exercises performed.               PT Education - 06/14/21 0826     Education Details Exercise technique    Person(s) Educated Patient    Methods Explanation;Demonstration;Tactile cues;Verbal cues    Comprehension Verbalized understanding;Returned  demonstration;Verbal cues required;Tactile cues required;Need further instruction              PT Short Term Goals - 04/29/21 1058       PT SHORT TERM GOAL #1   Title Pt will be independent with HEP in order to improve strength and balance in order to decrease fall risk and improve function at home and work.    Baseline 02/02/2021: Patient reports limited HEP in place [facility] 12/14 compliance; 1/20/2023Patient states no questions regarding his current exercise regimen and states he is compliant.    Time 6    Period Weeks    Status Achieved    Target Date 03/16/21               PT Long Term Goals - 06/08/21 1511       PT LONG TERM GOAL #1   Title Pt will improve FOTO to target score of 50 to display perceived improvements in ability to complete ADL's.    Baseline 02/02/2021= 43 12/143: 51%; 04/29/2021= 53%    Time 12    Period Weeks    Status Achieved      PT LONG TERM GOAL #2   Title Pt will decrease 5TSTS by at least 8 seconds in order to demonstrate clinically significant improvement in LE strength.    Baseline 02/02/2021= 28.22 sec with BUE Support 12/14: 23.1 seconds; 04/29/2021=Unable to test today due to patient became nauseous with 6 min walk test and attempted TUG- unable to continue- will reassess next 1-2 visits; 06/08/2021= 25.1 sec with min BUE support    Time 12    Period Weeks    Status Partially Met    Target Date 07/22/21      PT LONG TERM GOAL #3   Title Pt will decrease TUG to below 19 seconds/decrease in order to demonstrate decreased fall risk.    Baseline 02/02/2021= 27.30 sec without AD 12/14: 23 seconds; 04/29/2021- Unable to assess as patient became very nauseated upon standing and request to stop treatment. 06/08/2021= 19.10 sec without an AD    Time 12    Period Weeks    Status Partially Met    Target Date 07/22/21      PT LONG TERM GOAL #4   Title Pt will increase 6MWT by at least 11m(1674f in order to demonstrate clinically  significant improvement in cardiopulmonary endurance and community ambulation    Baseline 02/02/2021= 83 feet in 1 min 10 sec wihtout an AD 12/14: 200 ft in  38m11mtes; 04/29/2021=Patient ambulated approx 60 feet yet had to stop due ?  onset of nausea and unable to complete today    Time 12    Period Weeks    Status Partially Met    Target Date 07/22/21      PT LONG TERM GOAL #5   Title Pt will increase 10MWT by at least 0.2 m/s in order to demonstrate clinically significant improvement in community ambulation.    Baseline 02/02/2021= 0.42 m/s 12/14: 0.49 m/s; 04/29/2021=Patient unable to test today secondary to being nauseated. 06/08/2021= 0.62 m/s    Time 12    Period Weeks    Status Partially Met    Target Date 07/22/21                   Plan - 06/13/21 0827     Clinical Impression Statement Patient presents with incresaed nausea and treatment modified and terminated early due to patient fatiguing very quickly and remained nauseated throughout session. He was able to perform some seated and standing interventions with rest and modifications required. He remains in good spirits and motivated to do what he can to improve but just very limited due to not feeling well.  Pt will benefit from skilled PT services to address deficits in balance, mobility and QOL while decreasing risk of future falls    Personal Factors and Comorbidities Comorbidity 3+    Comorbidities DM, Liver transplant, ESRD, CAD    Examination-Activity Limitations Carry;Lift;Reach Overhead;Squat;Stairs;Stand;Transfers;Toileting    Examination-Participation Restrictions Cleaning;Community Activity;Driving;Medication Management;Meal Prep;Yard Work    Merchant navy officer Evolving/Moderate complexity    Rehab Potential Fair    PT Frequency 2x / week    PT Duration 12 weeks    PT Treatment/Interventions ADLs/Self Care Home Management;Cryotherapy;Moist Heat;DME Instruction;Gait training;Stair training;Functional  mobility training;Therapeutic activities;Therapeutic exercise;Balance training;Neuromuscular re-education;Patient/family education;Wheelchair mobility training;Manual techniques;Passive range of motion;Dry needling;Energy conservation;Joint Manipulations    PT Next Visit Plan Progressive Therapeutic exercises, Balance training, transfer/gait training    PT Home Exercise Plan No changes or updates today.    Consulted and Agree with Plan of Care Patient             Patient will benefit from skilled therapeutic intervention in order to improve the following deficits and impairments:  Abnormal gait, Cardiopulmonary status limiting activity, Decreased activity tolerance, Decreased balance, Decreased coordination, Decreased endurance, Decreased mobility, Decreased range of motion, Decreased strength, Difficulty walking, Hypomobility, Impaired flexibility, Impaired UE functional use  Visit Diagnosis: Difficulty in walking, not elsewhere classified  Other abnormalities of gait and mobility  Unsteadiness on feet     Problem List Patient Active Problem List   Diagnosis Date Noted   Pancreatic cyst    Verbal auditory hallucination    Early satiety    Feeding tube dysfunction    Generalized weakness 11/26/2020   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[place]) 11/26/2020   Recurrent falls 11/26/2020   Syncope 10/15/2020   Uremia 10/10/2020   ESRD on hemodialysis ([place]) 10/10/2020   Hyperkalemia 10/10/2020   Elevated troponin 10/10/2020   GERD (gastroesophageal reflux disease) 10/10/2020   CAD (coronary artery disease) 10/10/2020   G tube feedings ([place]) 10/10/2020   Diarrhea 10/10/2020   Type II diabetes mellitus with renal manifestations ([place]) 10/10/2020   Weakness    ACS (acute coronary syndrome) ([place])    Liver transplant recipient [facility])    Anemia of chronic disease    Type 2 diabetes mellitus with diabetic neuropathy, with long-term current use of insulin ([place])    ESRD (end stage renal  disease) ([place]) 08/19/2020   History of  anemia due to chronic kidney disease 08/19/2020   Gait abnormality 10/17/2019   History of fall within past 90 days 10/17/2019   Visual hallucination 10/17/2019   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 [place], [place], [address] Phone: [phone]   Fax:  [phone]  Name: Alex Hampton MRN: [id number] Date of Birth: Dec 01, 1955

## 2021-06-15 ENCOUNTER — Ambulatory Visit: Payer: Medicare Other

## 2021-06-15 ENCOUNTER — Other Ambulatory Visit: Payer: Self-pay

## 2021-06-15 DIAGNOSIS — R269 Unspecified abnormalities of gait and mobility: Secondary | ICD-10-CM

## 2021-06-15 DIAGNOSIS — R278 Other lack of coordination: Secondary | ICD-10-CM

## 2021-06-15 DIAGNOSIS — M6281 Muscle weakness (generalized): Secondary | ICD-10-CM | POA: Diagnosis not present

## 2021-06-15 DIAGNOSIS — R262 Difficulty in walking, not elsewhere classified: Secondary | ICD-10-CM

## 2021-06-15 NOTE — Therapy (Signed)
[place] MAIN [facility] SERVICES [address] [place], [place], [address] Phone: [phone]   Fax:  [phone]  Physical Therapy Treatment  Patient Details  Name: Alex Hampton MRN: [id number] Date of Birth: 05/08/1955 No data recorded  Encounter Date: 06/15/2021   PT End of Session - 06/15/21 1307     Visit Number 22    Number of Visits 40    Date for PT Re-Evaluation 07/22/21    Authorization Type Medicare/BCBS; PN 03/23/21    Authorization Time Period Initial Cert= 99/24/2683- 41/96/2229; Recert 7/98/9211-9/41/7408    Progress Note Due on Visit 20    PT Start Time 1303    PT Stop Time 1336    PT Time Calculation (min) 33 min    Equipment Utilized During Treatment Gait belt    Activity Tolerance Patient limited by fatigue;Patient limited by lethargy;Other (comment)   Patient became nauseous with mobility/testing today   Behavior During Therapy WFL for tasks assessed/performed             Past Medical History:  Diagnosis Date   Depression    History of cardiac cath    History of heart artery stent    Hyperlipidemia    Hypertension    MI (myocardial infarction) ([place])    Renal disorder     Past Surgical History:  Procedure Laterality Date   IR [place] TUBE CHANGE  11/22/2020   IR [place] TUBE CHANGE  11/26/2020   IR [place] GASTRO/COLONIC TUBE PERCUT W/FLUORO  10/14/2020   LEFT HEART CATH AND CORONARY ANGIOGRAPHY N/A 08/19/2020   Procedure: LEFT HEART CATH AND CORONARY ANGIOGRAPHY;  Surgeon: Corey Skains, MD;  Location: [place] [facility];  Service: Cardiovascular;  Laterality: N/A;   LIVER TRANSPLANT      There were no vitals filed for this visit.   Subjective Assessment - 06/15/21 1306     Subjective Patient reports continued nausea and overall weakness.    Patient is accompained by: Family member   [name] (she brought him but did not come back during eval)   Pertinent History Patient is a 66 year old male with recent  referral for abnomality of gait and recent liver transplant in November 2021. He has past medical history significant for Liver transplant (02/18/2020), CKD, End stage renal disease- On hemodialyis T/TH/Sat., Anemia, DM with neuropathy, Non-stemi, Coronary artery disease, Feeding tube with poor appetite.    Limitations Standing;Lifting;House hold activities;Walking    How long can you sit comfortably? no issues    How long can you stand comfortably? < 17mn    How long can you walk comfortably? < 2 min    Patient Stated Goals I want to return to my normal self- Independent with all mobility, not fall, with improved overall stamina.    Currently in Pain? No/denies                 INTERVENTIONS:   Therex:   Nustep L0 - UE/LE at 11/seat at 12 x 5 min   Seated hamstring curl using matrix cable system- 2.5 lb 2 sets of 12 reps each LE.   Scap retraction using Matrix cable system- 7.5 lb 2 sets of 10 reps  In // bars:  Standing step up onto 4" step block x 10 reps each leg  Static stand on airex pad without UE support x 20 sec x 3. Patient with mild unsteadiness - able to use ankle righting reactions and no significant LOB.  Patient reported very fatigued and said- "I have hit my limit today."   Education provided throughout session via VC/TC and demonstration to facilitate movement at target joints and correct muscle activation for all testing and exercises performed.                     PT Education - 06/15/21 1307     Education Details Exercise technique    Person(s) Educated Patient    Methods Explanation;Demonstration;Tactile cues;Verbal cues    Comprehension Verbalized understanding;Returned demonstration;Verbal cues required;Tactile cues required;Need further instruction              PT Short Term Goals - 04/29/21 1058       PT SHORT TERM GOAL #1   Title Pt will be independent with HEP in order to improve strength and balance in order to decrease  fall risk and improve function at home and work.    Baseline 02/02/2021: Patient reports limited HEP in place currently from [facility] agency 12/14 compliance; 1/20/2023Patient states no questions regarding his current exercise regimen and states he is compliant.    Time 6    Period Weeks    Status Achieved    Target Date 03/16/21               PT Long Term Goals - 06/08/21 1511       PT LONG TERM GOAL #1   Title Pt will improve FOTO to target score of 50 to display perceived improvements in ability to complete ADL's.    Baseline 02/02/2021= 43 12/143: 51%; 04/29/2021= 53%    Time 12    Period Weeks    Status Achieved      PT LONG TERM GOAL #2   Title Pt will decrease 5TSTS by at least 8 seconds in order to demonstrate clinically significant improvement in LE strength.    Baseline 02/02/2021= 28.22 sec with BUE Support 12/14: 23.1 seconds; 04/29/2021=Unable to test today due to patient became nauseous with 6 min walk test and attempted TUG- unable to continue- will reassess next 1-2 visits; 06/08/2021= 25.1 sec with min BUE support    Time 12    Period Weeks    Status Partially Met    Target Date 07/22/21      PT LONG TERM GOAL #3   Title Pt will decrease TUG to below 19 seconds/decrease in order to demonstrate decreased fall risk.    Baseline 02/02/2021= 27.30 sec without AD 12/14: 23 seconds; 04/29/2021- Unable to assess as patient became very nauseated upon standing and request to stop treatment. 06/08/2021= 19.10 sec without an AD    Time 12    Period Weeks    Status Partially Met    Target Date 07/22/21      PT LONG TERM GOAL #4   Title Pt will increase 6MWT by at least 31m(1627f in order to demonstrate clinically significant improvement in cardiopulmonary endurance and community ambulation    Baseline 02/02/2021= 83 feet in 1 min 10 sec wihtout an AD 12/14: 200 ft in  84m49mtes; 04/29/2021=Patient ambulated approx 60 feet yet had to stop due ?onset of nausea and unable to complete  today    Time 12    Period Weeks    Status Partially Met    Target Date 07/22/21      PT LONG TERM GOAL #5   Title Pt will increase 10MWT by at least 0.2 m/s in order to demonstrate clinically significant improvement in community ambulation.  Baseline 02/02/2021= 0.42 m/s 12/14: 0.49 m/s; 04/29/2021=Patient unable to test today secondary to being nauseated. 06/08/2021= 0.62 m/s    Time 12    Period Weeks    Status Partially Met    Target Date 07/22/21                   Plan - 06/15/21 1307     Clinical Impression Statement Patient arrived with good motivation despite not feeling well again today. He was able to tolerate approx 30 min of workout before stating he could not continue. His affect remains flat- not sure if mostly because he is not feeling well or just his nature. He is very quiet during session but attempts all prescribed exercises. He was able to progress to some more resistive strength training as well as some standing activities. Patient will continue to benefit from skilled physical therapy intervention in order to improve his strength, mobility, and overall quality of life    Personal Factors and Comorbidities Comorbidity 3+    Comorbidities DM, Liver transplant, ESRD, CAD    Examination-Activity Limitations Carry;Lift;Reach Overhead;Squat;Stairs;Stand;Transfers;Toileting    Examination-Participation Restrictions Cleaning;Community Activity;Driving;Medication Management;Meal Prep;Yard Work    [employer] officer Evolving/Moderate complexity    Rehab Potential Fair    PT Frequency 2x / week    PT Duration 12 weeks    PT Treatment/Interventions ADLs/Self Care Home Management;Cryotherapy;Moist Heat;DME Instruction;Gait training;Stair training;Functional mobility training;Therapeutic activities;Therapeutic exercise;Balance training;Neuromuscular re-education;Patient/family education;Wheelchair mobility training;Manual techniques;Passive range of  motion;Dry needling;Energy conservation;Joint Manipulations    PT Next Visit Plan Progressive Therapeutic exercises, Balance training, transfer/gait training    PT Home Exercise Plan No changes or updates today.    Consulted and Agree with Plan of Care Patient             Patient will benefit from skilled therapeutic intervention in order to improve the following deficits and impairments:  Abnormal gait, Cardiopulmonary status limiting activity, Decreased activity tolerance, Decreased balance, Decreased coordination, Decreased endurance, Decreased mobility, Decreased range of motion, Decreased strength, Difficulty walking, Hypomobility, Impaired flexibility, Impaired UE functional use  Visit Diagnosis: Muscle weakness (generalized)  Difficulty in walking, not elsewhere classified  Abnormality of gait and mobility  Other lack of coordination     Problem List Patient Active Problem List   Diagnosis Date Noted   Pancreatic cyst    Verbal auditory hallucination    Early satiety    Feeding tube dysfunction    Generalized weakness 11/26/2020   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[place]) 11/26/2020   Recurrent falls 11/26/2020   Syncope 10/15/2020   Uremia 10/10/2020   ESRD on hemodialysis ([place]) 10/10/2020   Hyperkalemia 10/10/2020   Elevated troponin 10/10/2020   GERD (gastroesophageal reflux disease) 10/10/2020   CAD (coronary artery disease) 10/10/2020   G tube feedings ([place]) 10/10/2020   Diarrhea 10/10/2020   Type II diabetes mellitus with renal manifestations ([place]) 10/10/2020   Weakness    ACS (acute coronary syndrome) ([place])    Liver transplant recipient [facility])    Anemia of chronic disease    Type 2 diabetes mellitus with diabetic neuropathy, with long-term current use of insulin ([place])    ESRD (end stage renal disease) ([place]) 08/19/2020   History of anemia due to chronic kidney disease 08/19/2020   Gait abnormality 10/17/2019   History of fall within past 90 days 10/17/2019    Visual hallucination 10/17/2019   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 [place], [place], [address] Phone: [phone]   Fax:  [phone]  Name: Alex Hampton MRN: [id number] Date of Birth: [birth year]

## 2021-06-20 ENCOUNTER — Other Ambulatory Visit: Payer: Self-pay

## 2021-06-20 ENCOUNTER — Ambulatory Visit: Payer: Medicare Other

## 2021-06-20 DIAGNOSIS — M6281 Muscle weakness (generalized): Secondary | ICD-10-CM | POA: Diagnosis not present

## 2021-06-20 DIAGNOSIS — R269 Unspecified abnormalities of gait and mobility: Secondary | ICD-10-CM

## 2021-06-20 DIAGNOSIS — R262 Difficulty in walking, not elsewhere classified: Secondary | ICD-10-CM

## 2021-06-20 DIAGNOSIS — R2681 Unsteadiness on feet: Secondary | ICD-10-CM

## 2021-06-20 NOTE — Therapy (Signed)
[place] MAIN [facility] SERVICES [address] [place], [place], [address] Phone: [phone]   Fax:  [phone]  Physical Therapy Treatment  Patient Details  Name: Alex Hampton MRN: [id number] Date of Birth: [birth year] No data recorded  Encounter Date: 06/20/2021   PT End of Session - 06/20/21 1329     Visit Number 23    Number of Visits 40    Date for PT Re-Evaluation 07/22/21    Authorization Type Medicare/BCBS; PN 03/23/21    Authorization Time Period Initial Cert= 26/41/5830- 94/10/6806; Recert 11/18/313-9/45/8592    Progress Note Due on Visit 20    PT Start Time 1300    PT Stop Time 1321    PT Time Calculation (min) 21 min    Equipment Utilized During Treatment Gait belt    Activity Tolerance Patient limited by fatigue;Patient limited by lethargy;Other (comment)   Patient became nauseous with mobility/testing today   Behavior During Therapy WFL for tasks assessed/performed             Past Medical History:  Diagnosis Date   Depression    History of cardiac cath    History of heart artery stent    Hyperlipidemia    Hypertension    MI (myocardial infarction) ([place])    Renal disorder     Past Surgical History:  Procedure Laterality Date   IR [place]J TUBE CHANGE  11/22/2020   IR [place] TUBE CHANGE  11/26/2020   IR [place] GASTRO/COLONIC TUBE PERCUT W/FLUORO  10/14/2020   LEFT HEART CATH AND CORONARY ANGIOGRAPHY N/A 08/19/2020   Procedure: LEFT HEART CATH AND CORONARY ANGIOGRAPHY;  Surgeon: Corey Skains, MD;  Location: [place] [facility];  Service: Cardiovascular;  Laterality: N/A;   LIVER TRANSPLANT      There were no vitals filed for this visit.   Subjective Assessment - 06/20/21 1328     Subjective Patient reports he was feeling rough but feeling even more exhausted and nauseated after arriving today.    Patient is accompained by: Family member   [name] (she brought him but did not come back during eval)   Pertinent  History Patient is a 66 year old male with recent referral for abnomality of gait and recent liver transplant in November 2021. He has past medical history significant for Liver transplant (02/18/2020), CKD, End stage renal disease- On hemodialyis T/TH/Sat., Anemia, DM with neuropathy, Non-stemi, Coronary artery disease, Feeding tube with poor appetite.    Limitations Standing;Lifting;House hold activities;Walking    How long can you sit comfortably? no issues    How long can you stand comfortably? < 57mn    How long can you walk comfortably? < 2 min    Patient Stated Goals I want to return to my normal self- Independent with all mobility, not fall, with improved overall stamina.    Currently in Pain? No/denies             INTERVENTIONS:   Seated Therex:   Seated hip flex 2.5 lb x 10 rep Seated calf raises 2.5 lb x 10 reps  Seated hip abd/abd  2.5 lb. up and over 1/2 foam x 10 reps BLE Seated LAQ 2.5 lb x 10 reps   Increased weakness noted on right LE than normal.  Patient was limited secondary to being nauseated/weak.   Patient requested to stop after 1 round of above exercises citing undo fatigue today.  PT Education - 06/20/21 1329     Education Details Exercise technique    Person(s) Educated Patient    Methods Explanation;Demonstration;Tactile cues;Verbal cues    Comprehension Verbalized understanding;Returned demonstration;Verbal cues required;Tactile cues required;Need further instruction              PT Short Term Goals - 04/29/21 1058       PT SHORT TERM GOAL #1   Title Pt will be independent with HEP in order to improve strength and balance in order to decrease fall risk and improve function at home and work.    Baseline 02/02/2021: Patient reports limited HEP in place currently from [facility] agency 12/14 compliance; 1/20/2023Patient states no questions regarding his current exercise regimen and states he is  compliant.    Time 6    Period Weeks    Status Achieved    Target Date 03/16/21               PT Long Term Goals - 06/08/21 1511       PT LONG TERM GOAL #1   Title Pt will improve FOTO to target score of 50 to display perceived improvements in ability to complete ADL's.    Baseline 02/02/2021= 43 12/143: 51%; 04/29/2021= 53%    Time 12    Period Weeks    Status Achieved      PT LONG TERM GOAL #2   Title Pt will decrease 5TSTS by at least 8 seconds in order to demonstrate clinically significant improvement in LE strength.    Baseline 02/02/2021= 28.22 sec with BUE Support 12/14: 23.1 seconds; 04/29/2021=Unable to test today due to patient became nauseous with 6 min walk test and attempted TUG- unable to continue- will reassess next 1-2 visits; 06/08/2021= 25.1 sec with min BUE support    Time 12    Period Weeks    Status Partially Met    Target Date 07/22/21      PT LONG TERM GOAL #3   Title Pt will decrease TUG to below 19 seconds/decrease in order to demonstrate decreased fall risk.    Baseline 02/02/2021= 27.30 sec without AD 12/14: 23 seconds; 04/29/2021- Unable to assess as patient became very nauseated upon standing and request to stop treatment. 06/08/2021= 19.10 sec without an AD    Time 12    Period Weeks    Status Partially Met    Target Date 07/22/21      PT LONG TERM GOAL #4   Title Pt will increase 6MWT by at least 89m(1664f in order to demonstrate clinically significant improvement in cardiopulmonary endurance and community ambulation    Baseline 02/02/2021= 83 feet in 1 min 10 sec wihtout an AD 12/14: 200 ft in  52m352mtes; 04/29/2021=Patient ambulated approx 60 feet yet had to stop due ?onset of nausea and unable to complete today    Time 12    Period Weeks    Status Partially Met    Target Date 07/22/21      PT LONG TERM GOAL #5   Title Pt will increase 10MWT by at least 0.2 m/s in order to demonstrate clinically significant improvement in community ambulation.     Baseline 02/02/2021= 0.42 m/s 12/14: 0.49 m/s; 04/29/2021=Patient unable to test today secondary to being nauseated. 06/08/2021= 0.62 m/s    Time 12    Period Weeks    Status Partially Met    Target Date 07/22/21  Plan - 06/20/21 1327     Clinical Impression Statement Treatment was limited today secondary to patient continues to be nauseated and weak. Reports has follow up appoint with Liver transplant team this week to discuss these issues. His participation has been limited to date and although he would benefit from continued PT - remains very limited during sessions. If he continues to struggle may consider [facility] services as an alternative. Patient will continue to benefit from skilled physical therapy intervention in order to improve his strength, mobility, and overall quality of life    Personal Factors and Comorbidities Comorbidity 3+    Comorbidities DM, Liver transplant, ESRD, CAD    Examination-Activity Limitations Carry;Lift;Reach Overhead;Squat;Stairs;Stand;Transfers;Toileting    Examination-Participation Restrictions Cleaning;Community Activity;Driving;Medication Management;Meal Prep;Yard Work    Merchant [employer] officer Evolving/Moderate complexity    Rehab Potential Fair    PT Frequency 2x / week    PT Duration 12 weeks    PT Treatment/Interventions ADLs/Self Care Home Management;Cryotherapy;Moist Heat;DME Instruction;Gait training;Stair training;Functional mobility training;Therapeutic activities;Therapeutic exercise;Balance training;Neuromuscular re-education;Patient/family education;Wheelchair mobility training;Manual techniques;Passive range of motion;Dry needling;Energy conservation;Joint Manipulations    PT Next Visit Plan Progressive Therapeutic exercises, Balance training, transfer/gait training    PT Home Exercise Plan No changes or updates today.    Consulted and Agree with Plan of Care Patient             Patient will benefit  from skilled therapeutic intervention in order to improve the following deficits and impairments:  Abnormal gait, Cardiopulmonary status limiting activity, Decreased activity tolerance, Decreased balance, Decreased coordination, Decreased endurance, Decreased mobility, Decreased range of motion, Decreased strength, Difficulty walking, Hypomobility, Impaired flexibility, Impaired UE functional use  Visit Diagnosis: Abnormality of gait and mobility  Difficulty in walking, not elsewhere classified  Muscle weakness (generalized)  Unsteadiness on feet     Problem List Patient Active Problem List   Diagnosis Date Noted   Pancreatic cyst    Verbal auditory hallucination    Early satiety    Feeding tube dysfunction    Generalized weakness 11/26/2020   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[place]) 11/26/2020   Recurrent falls 11/26/2020   Syncope 10/15/2020   Uremia 10/10/2020   ESRD on hemodialysis ([place]) 10/10/2020   Hyperkalemia 10/10/2020   Elevated troponin 10/10/2020   GERD (gastroesophageal reflux disease) 10/10/2020   CAD (coronary artery disease) 10/10/2020   G tube feedings ([place]) 10/10/2020   Diarrhea 10/10/2020   Type II diabetes mellitus with renal manifestations ([place]) 10/10/2020   Weakness    ACS (acute coronary syndrome) ([place])    Liver transplant recipient [facility])    Anemia of chronic disease    Type 2 diabetes mellitus with diabetic neuropathy, with long-term current use of insulin ([place])    ESRD (end stage renal disease) ([place]) 08/19/2020   History of anemia due to chronic kidney disease 08/19/2020   Gait abnormality 10/17/2019   History of fall within past 90 days 10/17/2019   Visual hallucination 10/17/2019   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

## 2021-06-22 ENCOUNTER — Ambulatory Visit: Payer: Medicare Other

## 2021-06-27 ENCOUNTER — Other Ambulatory Visit: Payer: Self-pay

## 2021-06-27 ENCOUNTER — Ambulatory Visit: Payer: Medicare Other

## 2021-06-27 DIAGNOSIS — M6281 Muscle weakness (generalized): Secondary | ICD-10-CM

## 2021-06-27 DIAGNOSIS — R262 Difficulty in walking, not elsewhere classified: Secondary | ICD-10-CM

## 2021-06-27 DIAGNOSIS — R278 Other lack of coordination: Secondary | ICD-10-CM

## 2021-06-27 DIAGNOSIS — R2681 Unsteadiness on feet: Secondary | ICD-10-CM

## 2021-06-27 DIAGNOSIS — R269 Unspecified abnormalities of gait and mobility: Secondary | ICD-10-CM

## 2021-06-27 NOTE — Therapy (Signed)
[place] ?[place], [place], [address] ?Phone: [phone]   Fax:  [phone] ? ?Physical Therapy Treatment ? ?Patient Details  ?Name: Alex Hampton ?MRN: [id number] ?Date of Birth: [birth year] ?No data recorded ? ?Encounter Date: 06/27/2021 ? ? PT End of Session - 06/27/21 1303   ? ? Visit Number 24   ? Number of Visits 40   ? Date for PT Re-Evaluation 07/22/21   ? Authorization Type Medicare/BCBS; PN 03/23/21   ? Authorization Time Period Initial Cert= 10/01/7626- 31/51/7616; Recert 0/73/7106-2/69/4854   ? Progress Note Due on Visit 20   ? PT Start Time 1301   ? PT Stop Time 1333   ? PT Time Calculation (min) 32 min   ? Equipment Utilized During Treatment Gait belt   ? Activity Tolerance Patient limited by fatigue;Patient limited by lethargy;Other (comment)   Patient became nauseous with mobility/testing today  ? Behavior During Therapy [facility] for tasks assessed/performed   ? ?  ?  ? ?  ? ? ?Past Medical History:  ?Diagnosis Date  ? Depression   ? History of cardiac cath   ? History of heart artery stent   ? Hyperlipidemia   ? Hypertension   ? MI (myocardial infarction) ([place])   ? Renal disorder   ? ? ?Past Surgical History:  ?Procedure Laterality Date  ? IR GJ TUBE CHANGE  11/22/2020  ? IR GJ TUBE CHANGE  11/26/2020  ? IR REPLC GASTRO/COLONIC TUBE PERCUT W/FLUORO  10/14/2020  ? LEFT HEART CATH AND CORONARY ANGIOGRAPHY N/A 08/19/2020  ? Procedure: LEFT HEART CATH AND CORONARY ANGIOGRAPHY;  Surgeon: Corey Skains, MD;  Location: [facility];  Service: Cardiovascular;  Laterality: N/A;  ? LIVER TRANSPLANT    ? ? ?There were no vitals filed for this visit. ? ? Subjective Assessment - 06/27/21 1300   ? ? Subjective Patient reports he is still very weak but denies any nausea today.   ? Patient is accompained by: Family member   [name] (she brought him but did not come back during eval)  ? Pertinent History Patient is a 66 year old male with  recent referral for abnomality of gait and recent liver transplant in November 2021. He has past medical history significant for Liver transplant (02/18/2020), CKD, End stage renal disease- On hemodialyis T/TH/Sat., Anemia, DM with neuropathy, Non-stemi, Coronary artery disease, Feeding tube with poor appetite.   ? Limitations Standing;Lifting;House hold activities;Walking   ? How long can you sit comfortably? no issues   ? How long can you stand comfortably? < 17mn   ? How long can you walk comfortably? < 2 min   ? Patient Stated Goals I want to return to my normal self- Independent with all mobility, not fall, with improved overall stamina.   ? Currently in Pain? No/denies   ? ?  ?  ? ?  ? ? ?INTERVENTIONS:  ? ?Therapeutic Exercises:  ? ?Standing hip march 2.5lb AW alt LE x 10 reps ? ?Standing hip ext 2.5 lb AW alt LE x 10 reps ? ?Standing hip abd 2.5 lb AW alt LE x 10 reps  ? ?Standing ankle calf raises 2.5 lb  AW BLE x 10 reps ? ?Seated knee ext 2.5 lb AW Alt LE x 10 reps.  ? ? ?Seated hip flex 2.5 lb AW alt LE x 10 reps ? ?Seated hamstring curl with RTB BLE x 10 reps ? ?Seated ankle DF  2.5 AW wrapped around toe box x BLE x 10. ? ?Gait in clinic around 100 feet without an AD - focusing on overall endurance- Short reciprocal steps- Stopped due to undo fatigue.  ? ?Education provided throughout session via VC/TC and demonstration to facilitate movement at target joints and correct muscle activation for all testing and exercises performed.  ? ? ? ? ? ? ? ? ? ? ? ? ? ? ? ? ? ? ? ? ? ? ? ? ? PT Education - 06/27/21 1300   ? ? Education Details Exercise technique   ? Person(s) Educated Patient   ? Methods Explanation;Demonstration;Tactile cues;Verbal cues   ? Comprehension Verbalized understanding;Returned demonstration;Tactile cues required;Verbal cues required;Need further instruction   ? ?  ?  ? ?  ? ? ? PT Short Term Goals - 04/29/21 1058   ? ?  ? PT SHORT TERM GOAL #1  ? Title Pt will be independent with HEP in  order to improve strength and balance in order to decrease fall risk and improve function at home and work.   ? Baseline 02/02/2021: Patient reports limited HEP in place currently from [facility] agency 12/14 compliance; 1/20/2023Patient states no questions regarding his current exercise regimen and states he is compliant.   ? Time 6   ? Period Weeks   ? Status Achieved   ? Target Date 03/16/21   ? ?  ?  ? ?  ? ? ? ? PT Long Term Goals - 06/08/21 1511   ? ?  ? PT LONG TERM GOAL #1  ? Title Pt will improve FOTO to target score of 50 to display perceived improvements in ability to complete ADL's.   ? Baseline 02/02/2021= 43 12/143: 51%; 04/29/2021= 53%   ? Time 12   ? Period Weeks   ? Status Achieved   ?  ? PT LONG TERM GOAL #2  ? Title Pt will decrease 5TSTS by at least 8 seconds in order to demonstrate clinically significant improvement in LE strength.   ? Baseline 02/02/2021= 28.22 sec with BUE Support 12/14: 23.1 seconds; 04/29/2021=Unable to test today due to patient became nauseous with 6 min walk test and attempted TUG- unable to continue- will reassess next 1-2 visits; 06/08/2021= 25.1 sec with min BUE support   ? Time 12   ? Period Weeks   ? Status Partially Met   ? Target Date 07/22/21   ?  ? PT LONG TERM GOAL #3  ? Title Pt will decrease TUG to below 19 seconds/decrease in order to demonstrate decreased fall risk.   ? Baseline 02/02/2021= 27.30 sec without AD 12/14: 23 seconds; 04/29/2021- Unable to assess as patient became very nauseated upon standing and request to stop treatment. 06/08/2021= 19.10 sec without an AD   ? Time 12   ? Period Weeks   ? Status Partially Met   ? Target Date 07/22/21   ?  ? PT LONG TERM GOAL #4  ? Title Pt will increase 6MWT by at least 1m(1649f in order to demonstrate clinically significant improvement in cardiopulmonary endurance and community ambulation   ? Baseline 02/02/2021= 83 feet in 1 min 10 sec wihtout an AD 12/14: 200 ft in  71m85mtes; 04/29/2021=Patient ambulated approx 60 feet  yet had to stop due ?onset of nausea and unable to complete today   ? Time 12   ? Period Weeks   ? Status Partially Met   ? Target Date 07/22/21   ?  ?  PT LONG TERM GOAL #5  ? Title Pt will increase 10MWT by at least 0.2 m/s in order to demonstrate clinically significant improvement in community ambulation.   ? Baseline 02/02/2021= 0.42 m/s 12/14: 0.49 m/s; 04/29/2021=Patient unable to test today secondary to being nauseated. 06/08/2021= 0.62 m/s   ? Time 12   ? Period Weeks   ? Status Partially Met   ? Target Date 07/22/21   ? ?  ?  ? ?  ? ? ? ? ? ? ? ? Plan - 06/27/21 1303   ? ? Clinical Impression Statement Patient presented today feeling fair- reported weak but not as nauseated. Continued focus on LE strengthening and endurance within his energy level. Several short rest breaks, but patient with much improved ability to participate today and completed 10 reps of several standing and later seated exercises. He continues to be very limited due to weakness and fatigue. Patient will continue to benefit from skilled physical therapy intervention in order to improve his strength, mobility, and overall quality of life   ? Personal Factors and Comorbidities Comorbidity 3+   ? Comorbidities DM, Liver transplant, ESRD, CAD   ? Examination-Activity Limitations Carry;Lift;Reach Overhead;Squat;Stairs;Stand;Transfers;Toileting   ? Examination-Participation Restrictions Cleaning;Community Activity;Driving;Medication Management;Meal Prep;[employer] Work   ? Stability/Clinical Decision Making Evolving/Moderate complexity   ? Rehab Potential Fair   ? PT Frequency 2x / week   ? PT Duration 12 weeks   ? PT Treatment/Interventions ADLs/Self Care Home Management;Cryotherapy;Moist Heat;DME Instruction;Gait training;Stair training;Functional mobility training;Therapeutic activities;Therapeutic exercise;Balance training;Neuromuscular re-education;Patient/family education;Wheelchair mobility training;Manual techniques;Passive range of motion;Dry  needling;Energy conservation;Joint Manipulations   ? PT Next Visit Plan Progressive Therapeutic exercises, Balance training, transfer/gait training   ? PT Home Exercise Plan No changes or updates today.

## 2021-06-29 ENCOUNTER — Ambulatory Visit: Payer: Medicare Other

## 2021-06-29 ENCOUNTER — Other Ambulatory Visit: Payer: Self-pay

## 2021-06-29 DIAGNOSIS — R262 Difficulty in walking, not elsewhere classified: Secondary | ICD-10-CM

## 2021-06-29 DIAGNOSIS — R2689 Other abnormalities of gait and mobility: Secondary | ICD-10-CM

## 2021-06-29 DIAGNOSIS — M6281 Muscle weakness (generalized): Secondary | ICD-10-CM

## 2021-06-29 DIAGNOSIS — R2681 Unsteadiness on feet: Secondary | ICD-10-CM

## 2021-06-29 DIAGNOSIS — R278 Other lack of coordination: Secondary | ICD-10-CM

## 2021-06-29 DIAGNOSIS — R269 Unspecified abnormalities of gait and mobility: Secondary | ICD-10-CM

## 2021-06-29 NOTE — Therapy (Signed)
[place] [facility] ?[place], [place], [address] ?Phone: [phone]   Fax:  [phone] ? ?Physical Therapy Treatment ? ?Patient Details  ?Name: Alex Hampton ?MRN: [id number] ?Date of Birth: 01-08-1956 ?No data recorded ? ?Encounter Date: 06/29/2021 ? ? PT End of Session - 06/29/21 1303   ? ? Visit Number 25   ? Number of Visits 40   ? Date for PT Re-Evaluation 07/22/21   ? Authorization Type Medicare/BCBS; PN 03/23/21   ? Authorization Time Period Initial Cert= 74/03/8785- 76/72/0947; Recert 0/96/2836-10/07/4763   ? Progress Note Due on Visit 20   ? PT Start Time 1300   ? PT Stop Time 1325   ? PT Time Calculation (min) 25 min   ? Equipment Utilized During Treatment Gait belt   ? Activity Tolerance Patient limited by fatigue;Patient limited by lethargy;Other (comment)   Patient became nauseous with mobility/testing today  ? Behavior During Therapy [facility] for tasks assessed/performed   ? ?  ?  ? ?  ? ? ?Past Medical History:  ?Diagnosis Date  ? Depression   ? History of cardiac cath   ? History of heart artery stent   ? Hyperlipidemia   ? Hypertension   ? MI (myocardial infarction) ([place])   ? Renal disorder   ? ? ?Past Surgical History:  ?Procedure Laterality Date  ? IR GJ TUBE CHANGE  11/22/2020  ? IR GJ TUBE CHANGE  11/26/2020  ? IR REPLC GASTRO/COLONIC TUBE PERCUT W/FLUORO  10/14/2020  ? LEFT HEART CATH AND CORONARY ANGIOGRAPHY N/A 08/19/2020  ? Procedure: LEFT HEART CATH AND CORONARY ANGIOGRAPHY;  Surgeon: Corey Skains, MD;  Location: [facility];  Service: Cardiovascular;  Laterality: N/A;  ? LIVER TRANSPLANT    ? ? ?There were no vitals filed for this visit. ? ? Subjective Assessment - 06/29/21 1302   ? ? Subjective Patient reports feeling some stronger today. States he rested after last session but no lingering issues.   ? Patient is accompained by: Family member   [name] (she brought him but did not come back during eval)  ? Pertinent  History Patient is a 66 year old male with recent referral for abnomality of gait and recent liver transplant in November 2021. He has past medical history significant for Liver transplant (02/18/2020), CKD, End stage renal disease- On hemodialyis T/TH/Sat., Anemia, DM with neuropathy, Non-stemi, Coronary artery disease, Feeding tube with poor appetite.   ? Limitations Standing;Lifting;House hold activities;Walking   ? How long can you sit comfortably? no issues   ? How long can you stand comfortably? < 41mn   ? How long can you walk comfortably? < 2 min   ? Patient Stated Goals I want to return to my normal self- Independent with all mobility, not fall, with improved overall stamina.   ? Currently in Pain? No/denies   ? ?  ?  ? ?  ? ? ? ?INTERVENTIONS:  ? ? ? ?INTERVENTIONS:  ?  ?Therapeutic Exercises:  ? ? ?Seated hip flex 2.5 lb AW alt LE x 10 reps ? ?Seated knee ext 2.5 lb AW Alt LE x 10 reps.  ? ?Standing ankle calf raises into toe raises 2.5 lb  AW BLE x 10 reps ? ?Standing hip abd 2.5 lb AW alt LE x 10 reps  ?  ?  ?Standing hip march 2.5lb AW alt LE x 10 reps ?  ?Standing hip ext 2.5 lb AW alt  LE x 10 reps ?  ?*Patient requested to end early as he became overwhelmingly fatigued.   ?  ?  ?Education provided throughout session via VC/TC and demonstration to facilitate movement at target joints and correct muscle activation for all testing and exercises performed.  ? ? ? ?  ?  ? ? ? ? ? ? ? ? ? ? ? ? ? ? ? ? ? ? ? ? ? ? ? ? PT Education - 06/29/21 1303   ? ? Education Details Exercise technique   ? Person(s) Educated Patient   ? Methods Explanation;Demonstration;Tactile cues;Verbal cues   ? Comprehension Verbalized understanding;Returned demonstration;Verbal cues required;Tactile cues required;Need further instruction   ? ?  ?  ? ?  ? ? ? PT Short Term Goals - 04/29/21 1058   ? ?  ? PT SHORT TERM GOAL #1  ? Title Pt will be independent with HEP in order to improve strength and balance in order to decrease fall  risk and improve function at home and work.   ? Baseline 02/02/2021: Patient reports limited HEP in place currently from [facility] agency 12/14 compliance; 1/20/2023Patient states no questions regarding his current exercise regimen and states he is compliant.   ? Time 6   ? Period Weeks   ? Status Achieved   ? Target Date 03/16/21   ? ?  ?  ? ?  ? ? ? ? PT Long Term Goals - 06/08/21 1511   ? ?  ? PT LONG TERM GOAL #1  ? Title Pt will improve FOTO to target score of 50 to display perceived improvements in ability to complete ADL's.   ? Baseline 02/02/2021= 43 12/143: 51%; 04/29/2021= 53%   ? Time 12   ? Period Weeks   ? Status Achieved   ?  ? PT LONG TERM GOAL #2  ? Title Pt will decrease 5TSTS by at least 8 seconds in order to demonstrate clinically significant improvement in LE strength.   ? Baseline 02/02/2021= 28.22 sec with BUE Support 12/14: 23.1 seconds; 04/29/2021=Unable to test today due to patient became nauseous with 6 min walk test and attempted TUG- unable to continue- will reassess next 1-2 visits; 06/08/2021= 25.1 sec with min BUE support   ? Time 12   ? Period Weeks   ? Status Partially Met   ? Target Date 07/22/21   ?  ? PT LONG TERM GOAL #3  ? Title Pt will decrease TUG to below 19 seconds/decrease in order to demonstrate decreased fall risk.   ? Baseline 02/02/2021= 27.30 sec without AD 12/14: 23 seconds; 04/29/2021- Unable to assess as patient became very nauseated upon standing and request to stop treatment. 06/08/2021= 19.10 sec without an AD   ? Time 12   ? Period Weeks   ? Status Partially Met   ? Target Date 07/22/21   ?  ? PT LONG TERM GOAL #4  ? Title Pt will increase 6MWT by at least 746m(1672f in order to demonstrate clinically significant improvement in cardiopulmonary endurance and community ambulation   ? Baseline 02/02/2021= 83 feet in 1 min 10 sec wihtout an AD 12/14: 200 ft in  46m46mtes; 04/29/2021=Patient ambulated approx 60 feet yet had to stop due ?onset of nausea and unable to complete today    ? Time 12   ? Period Weeks   ? Status Partially Met   ? Target Date 07/22/21   ?  ? PT LONG TERM GOAL #5  ?  Title Pt will increase 10MWT by at least 0.2 m/s in order to demonstrate clinically significant improvement in community ambulation.   ? Baseline 02/02/2021= 0.42 m/s 12/14: 0.49 m/s; 04/29/2021=Patient unable to test today secondary to being nauseated. 06/08/2021= 0.62 m/s   ? Time 12   ? Period Weeks   ? Status Partially Met   ? Target Date 07/22/21   ? ?  ?  ? ?  ? ? ? ? ? ? ? ? Plan - 06/29/21 1307   ? ? Clinical Impression Statement Patient presented with good motivation and able to do many of the activities he completed in last session including some more standing. He was not limited by nausea today but self limiting due to reported undo fatigue. Despite rest breaks patient continues to demon increased overall weakness in sessions. Patient will continue to benefit from skilled physical therapy intervention in order to improve his strength, mobility, and overall quality of life   ? Personal Factors and Comorbidities Comorbidity 3+   ? Comorbidities DM, Liver transplant, ESRD, CAD   ? Examination-Activity Limitations Carry;Lift;Reach Overhead;Squat;Stairs;Stand;Transfers;Toileting   ? Examination-Participation Restrictions Cleaning;Community Activity;Driving;Medication Management;Meal Prep;[employer] Work   ? Stability/Clinical Decision Making Evolving/Moderate complexity   ? Rehab Potential Fair   ? PT Frequency 2x / week   ? PT Duration 12 weeks   ? PT Treatment/Interventions ADLs/Self Care Home Management;Cryotherapy;Moist Heat;DME Instruction;Gait training;Stair training;Functional mobility training;Therapeutic activities;Therapeutic exercise;Balance training;Neuromuscular re-education;Patient/family education;Wheelchair mobility training;Manual techniques;Passive range of motion;Dry needling;Energy conservation;Joint Manipulations   ? PT Next Visit Plan Progressive Therapeutic exercises, Balance training,  transfer/gait training   ? PT Home Exercise Plan No changes or updates today.   ? Consulted and Agree with Plan of Care Patient   ? ?  ?  ? ?  ? ? ?Patient will benefit from skilled therapeutic intervention

## 2021-07-04 ENCOUNTER — Other Ambulatory Visit: Payer: Self-pay

## 2021-07-04 ENCOUNTER — Ambulatory Visit: Payer: Medicare Other

## 2021-07-04 DIAGNOSIS — M6281 Muscle weakness (generalized): Secondary | ICD-10-CM | POA: Diagnosis not present

## 2021-07-04 DIAGNOSIS — R2681 Unsteadiness on feet: Secondary | ICD-10-CM

## 2021-07-04 DIAGNOSIS — R269 Unspecified abnormalities of gait and mobility: Secondary | ICD-10-CM

## 2021-07-04 DIAGNOSIS — R278 Other lack of coordination: Secondary | ICD-10-CM

## 2021-07-04 DIAGNOSIS — R2689 Other abnormalities of gait and mobility: Secondary | ICD-10-CM

## 2021-07-04 DIAGNOSIS — R262 Difficulty in walking, not elsewhere classified: Secondary | ICD-10-CM

## 2021-07-04 NOTE — Therapy (Signed)
?[facility] ?[place], [place], [address] ?Phone: [phone]   Fax:  [phone] ? ?Physical Therapy Treatment ? ?Patient Details  ?Name: Alex Hampton ?MRN: [id number] ?Date of Birth: [birth year] ?No data recorded ? ?Encounter Date: 07/04/2021 ? ? PT End of Session - 07/04/21 1309   ? ? Visit Number 26   ? Number of Visits 40   ? Date for PT Re-Evaluation 07/22/21   ? Authorization Type Medicare/BCBS; PN 03/23/21   ? Authorization Time Period Initial Cert= 02/63/7858- 85/05/7739; Recert 2/87/8676-10/27/9468   ? Progress Note Due on Visit 20   ? PT Start Time 1302   ? PT Stop Time 1342   ? PT Time Calculation (min) 40 min   ? Equipment Utilized During Treatment Gait belt   ? Activity Tolerance Patient limited by fatigue;Patient limited by lethargy;Other (comment)   Patient became nauseous with mobility/testing today  ? Behavior During Therapy [facility] for tasks assessed/performed   ? ?  ?  ? ?  ? ? ?Past Medical History:  ?Diagnosis Date  ? Depression   ? History of cardiac cath   ? History of heart artery stent   ? Hyperlipidemia   ? Hypertension   ? MI (myocardial infarction) ([place])   ? Renal disorder   ? ? ?Past Surgical History:  ?Procedure Laterality Date  ? IR GJ TUBE CHANGE  11/22/2020  ? IR GJ TUBE CHANGE  11/26/2020  ? IR REPLC GASTRO/COLONIC TUBE PERCUT W/FLUORO  10/14/2020  ? LEFT HEART CATH AND CORONARY ANGIOGRAPHY N/A 08/19/2020  ? Procedure: LEFT HEART CATH AND CORONARY ANGIOGRAPHY;  Surgeon: Corey Skains, MD;  Location: [facility];  Service: Cardiovascular;  Laterality: N/A;  ? LIVER TRANSPLANT    ? ? ?There were no vitals filed for this visit. ? ? Subjective Assessment - 07/04/21 1306   ? ? Subjective Patient reports feel "whoozy" today.   ? Patient is accompained by: Family member   [name] (she brought him but did not come back during eval)  ? Pertinent History Patient is a 66 year old male with recent referral for abnomality  of gait and recent liver transplant in November 2021. He has past medical history significant for Liver transplant (02/18/2020), CKD, End stage renal disease- On hemodialyis T/TH/Sat., Anemia, DM with neuropathy, Non-stemi, Coronary artery disease, Feeding tube with poor appetite.   ? Limitations Standing;Lifting;House hold activities;Walking   ? How long can you sit comfortably? no issues   ? How long can you stand comfortably? < 27mn   ? How long can you walk comfortably? < 2 min   ? Patient Stated Goals I want to return to my normal self- Independent with all mobility, not fall, with improved overall stamina.   ? Currently in Pain? No/denies   ? ?  ?  ? ?  ? ? ?INTERVENTIONS:  ? ?Therex:  ? ?Standing Hip march - Step tap onto 1st step-reps alt LE ?Seated hip march - step tap onto 1st step 10 reps alt LE ? ?Seated Hip abd using RTB x 10 reps ?Standing Hip abd  alt LE x 10 reps ? ?Seated hamstring curl with RTB x 10 reps ?Standing Hamstring curl at steps x 10 reps alt LE's ? ?Seated knee ext- x 10 reps alt LE  ?Standing Minisquats x 10 reps  ? ? ?Seated Hip add with red ball x 5 sec hold x 5  ?Ambulation around 52 feet  without AD (O2 sat=97% and HR= 77 bpm)  ? ?Seated Calf raises BLE x 10 reps ?Ambulation around 61 feet without AD ? ?Seated toe raises BLE x 10 reps ?Ambulation around 52 feet without AD ? ?Education provided throughout session via VC/TC and demonstration to facilitate movement at target joints and correct muscle activation for all testing and exercises performed.  ? ? ? ? ? ? ? ? ? ? ? ? ? ? ? ? ? ? ? ? ? ? ? PT Education - 07/04/21 1320   ? ? Education Details Exercise technique   ? Person(s) Educated Patient   ? Methods Explanation;Demonstration;Tactile cues;Verbal cues   ? Comprehension Verbalized understanding;Returned demonstration;Verbal cues required;Need further instruction;Tactile cues required   ? ?  ?  ? ?  ? ? ? PT Short Term Goals - 04/29/21 1058   ? ?  ? PT SHORT TERM GOAL #1  ? Title  Pt will be independent with HEP in order to improve strength and balance in order to decrease fall risk and improve function at home and work.   ? Baseline 02/02/2021: Patient reports limited HEP in place currently from [facility] agency 12/14 compliance; 1/20/2023Patient states no questions regarding his current exercise regimen and states he is compliant.   ? Time 6   ? Period Weeks   ? Status Achieved   ? Target Date 03/16/21   ? ?  ?  ? ?  ? ? ? ? PT Long Term Goals - 06/08/21 1511   ? ?  ? PT LONG TERM GOAL #1  ? Title Pt will improve FOTO to target score of 50 to display perceived improvements in ability to complete ADL's.   ? Baseline 02/02/2021= 43 12/143: 51%; 04/29/2021= 53%   ? Time 12   ? Period Weeks   ? Status Achieved   ?  ? PT LONG TERM GOAL #2  ? Title Pt will decrease 5TSTS by at least 8 seconds in order to demonstrate clinically significant improvement in LE strength.   ? Baseline 02/02/2021= 28.22 sec with BUE Support 12/14: 23.1 seconds; 04/29/2021=Unable to test today due to patient became nauseous with 6 min walk test and attempted TUG- unable to continue- will reassess next 1-2 visits; 06/08/2021= 25.1 sec with min BUE support   ? Time 12   ? Period Weeks   ? Status Partially Met   ? Target Date 07/22/21   ?  ? PT LONG TERM GOAL #3  ? Title Pt will decrease TUG to below 19 seconds/decrease in order to demonstrate decreased fall risk.   ? Baseline 02/02/2021= 27.30 sec without AD 12/14: 23 seconds; 04/29/2021- Unable to assess as patient became very nauseated upon standing and request to stop treatment. 06/08/2021= 19.10 sec without an AD   ? Time 12   ? Period Weeks   ? Status Partially Met   ? Target Date 07/22/21   ?  ? PT LONG TERM GOAL #4  ? Title Pt will increase 6MWT by at least 76m(1625f in order to demonstrate clinically significant improvement in cardiopulmonary endurance and community ambulation   ? Baseline 02/02/2021= 83 feet in 1 min 10 sec wihtout an AD 12/14: 200 ft in  47m84mtes;  04/29/2021=Patient ambulated approx 60 feet yet had to stop due ?onset of nausea and unable to complete today   ? Time 12   ? Period Weeks   ? Status Partially Met   ? Target Date 07/22/21   ?  ?  PT LONG TERM GOAL #5  ? Title Pt will increase 10MWT by at least 0.2 m/s in order to demonstrate clinically significant improvement in community ambulation.   ? Baseline 02/02/2021= 0.42 m/s 12/14: 0.49 m/s; 04/29/2021=Patient unable to test today secondary to being nauseated. 06/08/2021= 0.62 m/s   ? Time 12   ? Period Weeks   ? Status Partially Met   ? Target Date 07/22/21   ? ?  ?  ? ?  ? ? ? ? ? ? ? ? Plan - 07/04/21 1310   ? ? Clinical Impression Statement Patient presented with good motivation for today's session. Patient performed better today mixing some standing and seated LE strengthening exercises. He was able to complete more strengthening exercises than in previous visits today. He was still limited with overall endurance but much improved overall today. Patient will continue to benefit from skilled physical therapy intervention in order to improve his strength, mobility, and overall quality of life   ? Personal Factors and Comorbidities Comorbidity 3+   ? Comorbidities DM, Liver transplant, ESRD, CAD   ? Examination-Activity Limitations Carry;Lift;Reach Overhead;Squat;Stairs;Stand;Transfers;Toileting   ? Examination-Participation Restrictions Cleaning;Community Activity;Driving;Medication Management;Meal Prep;[employer] Work   ? Stability/Clinical Decision Making Evolving/Moderate complexity   ? Rehab Potential Fair   ? PT Frequency 2x / week   ? PT Duration 12 weeks   ? PT Treatment/Interventions ADLs/Self Care Home Management;Cryotherapy;Moist Heat;DME Instruction;Gait training;Stair training;Functional mobility training;Therapeutic activities;Therapeutic exercise;Balance training;Neuromuscular re-education;Patient/family education;Wheelchair mobility training;Manual techniques;Passive range of motion;Dry  needling;Energy conservation;Joint Manipulations   ? PT Next Visit Plan Progressive Therapeutic exercises, Balance training, transfer/gait training   ? PT Home Exercise Plan No changes or updates today.   ? Consulted and

## 2021-07-06 ENCOUNTER — Ambulatory Visit: Payer: Medicare Other

## 2021-07-06 DIAGNOSIS — R296 Repeated falls: Secondary | ICD-10-CM

## 2021-07-06 DIAGNOSIS — R2689 Other abnormalities of gait and mobility: Secondary | ICD-10-CM

## 2021-07-06 DIAGNOSIS — M6281 Muscle weakness (generalized): Secondary | ICD-10-CM

## 2021-07-06 DIAGNOSIS — R278 Other lack of coordination: Secondary | ICD-10-CM

## 2021-07-06 DIAGNOSIS — R262 Difficulty in walking, not elsewhere classified: Secondary | ICD-10-CM

## 2021-07-06 DIAGNOSIS — R2681 Unsteadiness on feet: Secondary | ICD-10-CM

## 2021-07-06 DIAGNOSIS — R269 Unspecified abnormalities of gait and mobility: Secondary | ICD-10-CM

## 2021-07-06 NOTE — Therapy (Signed)
[facility] ?[place], [place], [address] ?Phone: [phone]   Fax:  [phone] ? ?Physical Therapy Treatment ? ?Patient Details  ?Name: Alex Hampton ?MRN: [id number] ?Date of Birth: 05-Dec-1955 ?No data recorded ? ?Encounter Date: 07/06/2021 ? ? PT End of Session - 07/06/21 1306   ? ? Visit Number 27   ? Number of Visits 40   ? Date for PT Re-Evaluation 07/22/21   ? Authorization Type Medicare/BCBS; PN 03/23/21   ? Authorization Time Period Initial Cert= 44/06/4740- 59/56/3875; Recert 6/43/3295-1/88/4166   ? Progress Note Due on Visit 20   ? PT Start Time 1300   ? PT Stop Time 1335   ? PT Time Calculation (min) 35 min   ? Equipment Utilized During Treatment Gait belt   ? Activity Tolerance Patient limited by fatigue;Patient limited by lethargy;Other (comment)   Patient became nauseous with mobility/testing today  ? Behavior During Therapy [facility] for tasks assessed/performed   ? ?  ?  ? ?  ? ? ?Past Medical History:  ?Diagnosis Date  ? Depression   ? History of cardiac cath   ? History of heart artery stent   ? Hyperlipidemia   ? Hypertension   ? MI (myocardial infarction) ([place])   ? Renal disorder   ? ? ?Past Surgical History:  ?Procedure Laterality Date  ? IR GJ TUBE CHANGE  11/22/2020  ? IR GJ TUBE CHANGE  11/26/2020  ? IR REPLC GASTRO/COLONIC TUBE PERCUT W/FLUORO  10/14/2020  ? LEFT HEART CATH AND CORONARY ANGIOGRAPHY N/A 08/19/2020  ? Procedure: LEFT HEART CATH AND CORONARY ANGIOGRAPHY;  Surgeon: Corey Skains, MD;  Location: [facility];  Service: Cardiovascular;  Laterality: N/A;  ? LIVER TRANSPLANT    ? ? ?There were no vitals filed for this visit. ? ? Subjective Assessment - 07/06/21 1029   ? ? Subjective Patient reports feeling lightheaded today. Denies any pain or falls   ? Patient is accompained by: Family member   [name] (she brought him but did not come back during eval)  ? Pertinent History Patient is a 65 year old male with  recent referral for abnomality of gait and recent liver transplant in November 2021. He has past medical history significant for Liver transplant (02/18/2020), CKD, End stage renal disease- On hemodialyis T/TH/Sat., Anemia, DM with neuropathy, Non-stemi, Coronary artery disease, Feeding tube with poor appetite.   ? Limitations Standing;Lifting;House hold activities;Walking   ? How long can you sit comfortably? no issues   ? How long can you stand comfortably? < 48mn   ? How long can you walk comfortably? < 2 min   ? Patient Stated Goals I want to return to my normal self- Independent with all mobility, not fall, with improved overall stamina.   ? Currently in Pain? No/denies   ? ?  ?  ? ?  ? ? ?INTERVENTIONS:  ? ?*Patient reported being slightly lightheaded upon arrival but feeling well in general.  ? ?BP- sitting- 92/59 mmHg Right UE ?BP- Standing-72/51 mmHg Right UE ?*Patient drank approx 4 oz water- and BP= 75/47 ? ?Seated Hip march  3lb AW x 10 reps ? ?Seated knee ext 3lb AW x 10 reps ? ?Seated hip add - ball squeeze with 5 sec hold x 10 reps ? ?Seated hip abd - with use of RTB- 10 reps ? ?Hamstring curl- using RTB BLE x 10 reps ? ?Calf raises - 3lb AW BLE  x 10 reps ? ?Toe raises- 3lb AW BLE x 10 reps ? ?-Patient then performed a second set of 10 of all above  therex without any resistance - No complaint of pain- only limited by fatigue and some nausea ? ? ? ? ?Education provided throughout session via VC/TC and demonstration to facilitate movement at target joints and correct muscle activation for all testing and exercises performed.  ? ? ? ? ? ? ? ? ? ? ? ? ? ? ? ? ? ? ? ? ? ? ? ? PT Education - 07/07/21 1029   ? ? Education Details Exercise technique   ? Person(s) Educated Patient   ? Methods Explanation;Demonstration;Tactile cues;Verbal cues   ? Comprehension Verbalized understanding;Returned demonstration;Verbal cues required;Tactile cues required;Need further instruction   ? ?  ?  ? ?  ? ? ? PT Short Term  Goals - 04/29/21 1058   ? ?  ? PT SHORT TERM GOAL #1  ? Title Pt will be independent with HEP in order to improve strength and balance in order to decrease fall risk and improve function at home and work.   ? Baseline 02/02/2021: Patient reports limited HEP in place currently from [facility] agency 12/14 compliance; 1/20/2023Patient states no questions regarding his current exercise regimen and states he is compliant.   ? Time 6   ? Period Weeks   ? Status Achieved   ? Target Date 03/16/21   ? ?  ?  ? ?  ? ? ? ? PT Long Term Goals - 06/08/21 1511   ? ?  ? PT LONG TERM GOAL #1  ? Title Pt will improve FOTO to target score of 50 to display perceived improvements in ability to complete ADL's.   ? Baseline 02/02/2021= 43 12/143: 51%; 04/29/2021= 53%   ? Time 12   ? Period Weeks   ? Status Achieved   ?  ? PT LONG TERM GOAL #2  ? Title Pt will decrease 5TSTS by at least 8 seconds in order to demonstrate clinically significant improvement in LE strength.   ? Baseline 02/02/2021= 28.22 sec with BUE Support 12/14: 23.1 seconds; 04/29/2021=Unable to test today due to patient became nauseous with 6 min walk test and attempted TUG- unable to continue- will reassess next 1-2 visits; 06/08/2021= 25.1 sec with min BUE support   ? Time 12   ? Period Weeks   ? Status Partially Met   ? Target Date 07/22/21   ?  ? PT LONG TERM GOAL #3  ? Title Pt will decrease TUG to below 19 seconds/decrease in order to demonstrate decreased fall risk.   ? Baseline 02/02/2021= 27.30 sec without AD 12/14: 23 seconds; 04/29/2021- Unable to assess as patient became very nauseated upon standing and request to stop treatment. 06/08/2021= 19.10 sec without an AD   ? Time 12   ? Period Weeks   ? Status Partially Met   ? Target Date 07/22/21   ?  ? PT LONG TERM GOAL #4  ? Title Pt will increase 6MWT by at least 51m(1643f in order to demonstrate clinically significant improvement in cardiopulmonary endurance and community ambulation   ? Baseline 02/02/2021= 83 feet in 1  min 10 sec wihtout an AD 12/14: 200 ft in  71m65mtes; 04/29/2021=Patient ambulated approx 60 feet yet had to stop due ?onset of nausea and unable to complete today   ? Time 12   ? Period Weeks   ? Status Partially Met   ?  Target Date 07/22/21   ?  ? PT LONG TERM GOAL #5  ? Title Pt will increase 10MWT by at least 0.2 m/s in order to demonstrate clinically significant improvement in community ambulation.   ? Baseline 02/02/2021= 0.42 m/s 12/14: 0.49 m/s; 04/29/2021=Patient unable to test today secondary to being nauseated. 06/08/2021= 0.62 m/s   ? Time 12   ? Period Weeks   ? Status Partially Met   ? Target Date 07/22/21   ? ?  ?  ? ?  ? ? ? ? ? ? ? ? Plan - 07/06/21 1027   ? ? Clinical Impression Statement Patient limited today- not feeling well overall- lightheaded- Did drink some water yet no improvement with standing BP so treatment deferred to only sitting exercise with no report of lightheadness. He was able to perform 10 reps with slight rest break today and remained motivated to do what he could despite not feeling well. Unable to progress to more standing today but will attempt again next visit. Patient will continue to benefit from skilled physical therapy intervention in order to improve his strength, mobility, and overall quality of life   ? Personal Factors and Comorbidities Comorbidity 3+   ? Comorbidities DM, Liver transplant, ESRD, CAD   ? Examination-Activity Limitations Carry;Lift;Reach Overhead;Squat;Stairs;Stand;Transfers;Toileting   ? Examination-Participation Restrictions Cleaning;Community Activity;Driving;Medication Management;Meal Prep;Valla Leaver Work   ? Stability/Clinical Decision Making Evolving/Moderate complexity   ? Rehab Potential Fair   ? PT Frequency 2x / week   ? PT Duration 12 weeks   ? PT Treatment/Interventions ADLs/Self Care Home Management;Cryotherapy;Moist Heat;DME Instruction;Gait training;Stair training;Functional mobility training;Therapeutic activities;Therapeutic exercise;Balance  training;Neuromuscular re-education;Patient/family education;Wheelchair mobility training;Manual techniques;Passive range of motion;Dry needling;Energy conservation;Joint Manipulations   ? PT Next Visit

## 2021-07-12 ENCOUNTER — Ambulatory Visit: Payer: Medicare Other

## 2021-07-13 ENCOUNTER — Ambulatory Visit: Payer: Medicare Other | Attending: Gastroenterology

## 2021-07-13 DIAGNOSIS — R2689 Other abnormalities of gait and mobility: Secondary | ICD-10-CM | POA: Diagnosis present

## 2021-07-13 DIAGNOSIS — R296 Repeated falls: Secondary | ICD-10-CM | POA: Diagnosis present

## 2021-07-13 DIAGNOSIS — M6281 Muscle weakness (generalized): Secondary | ICD-10-CM | POA: Diagnosis present

## 2021-07-13 DIAGNOSIS — R262 Difficulty in walking, not elsewhere classified: Secondary | ICD-10-CM | POA: Diagnosis present

## 2021-07-13 DIAGNOSIS — R278 Other lack of coordination: Secondary | ICD-10-CM | POA: Insufficient documentation

## 2021-07-13 DIAGNOSIS — R269 Unspecified abnormalities of gait and mobility: Secondary | ICD-10-CM | POA: Insufficient documentation

## 2021-07-13 DIAGNOSIS — R2681 Unsteadiness on feet: Secondary | ICD-10-CM | POA: Insufficient documentation

## 2021-07-13 NOTE — Therapy (Signed)
[place] [facility] ?[place], [place], [address] ?Phone: [phone]   Fax:  [phone] ? ?Physical Therapy Treatment ? ?Patient Details  ?Name: Alex Hampton ?MRN: [id number] ?Date of Birth: Dec 02, 1955 ?No data recorded ? ?Encounter Date: 07/13/2021 ? ? PT End of Session - 07/13/21 1107   ? ? Visit Number 28   ? Number of Visits 40   ? Date for PT Re-Evaluation 07/22/21   ? Authorization Type Medicare/BCBS; PN 03/23/21   ? Authorization Time Period Initial Cert= 24/26/8341- 96/22/2979; Recert 8/92/1194-1/74/0814   ? Progress Note Due on Visit 20   ? PT Start Time 1102   ? PT Stop Time 1136   ? PT Time Calculation (min) 34 min   ? Equipment Utilized During Treatment Gait belt   ? Activity Tolerance Patient limited by fatigue;Patient limited by lethargy;Other (comment)   Patient became nauseous with mobility/testing today  ? Behavior During Therapy [facility] for tasks assessed/performed   ? ?  ?  ? ?  ? ? ?Past Medical History:  ?Diagnosis Date  ? Depression   ? History of cardiac cath   ? History of heart artery stent   ? Hyperlipidemia   ? Hypertension   ? MI (myocardial infarction) ([place])   ? Renal disorder   ? ? ?Past Surgical History:  ?Procedure Laterality Date  ? IR GJ TUBE CHANGE  11/22/2020  ? IR GJ TUBE CHANGE  11/26/2020  ? IR REPLC GASTRO/COLONIC TUBE PERCUT W/FLUORO  10/14/2020  ? LEFT HEART CATH AND CORONARY ANGIOGRAPHY N/A 08/19/2020  ? Procedure: LEFT HEART CATH AND CORONARY ANGIOGRAPHY;  Surgeon: Corey Skains, MD;  Location: [facility];  Service: Cardiovascular;  Laterality: N/A;  ? LIVER TRANSPLANT    ? ? ?There were no vitals filed for this visit. ? ? Subjective Assessment - 07/13/21 1106   ? ? Subjective Patient reports stomach is upset some and low energy today.   ? Patient is accompained by: Family member   [name] (she brought him but did not come back during eval)  ? Pertinent History Patient is a 66 year old male with recent  referral for abnomality of gait and recent liver transplant in November 2021. He has past medical history significant for Liver transplant (02/18/2020), CKD, End stage renal disease- On hemodialyis T/TH/Sat., Anemia, DM with neuropathy, Non-stemi, Coronary artery disease, Feeding tube with poor appetite.   ? Limitations Standing;Lifting;House hold activities;Walking   ? How long can you sit comfortably? no issues   ? How long can you stand comfortably? < 19mn   ? How long can you walk comfortably? < 2 min   ? Patient Stated Goals I want to return to my normal self- Independent with all mobility, not fall, with improved overall stamina.   ? Currently in Pain? No/denies   ? ?  ?  ? ?  ? ? ? ? ?INTERVENTIONS:  ? ?Therex:  ? ? ?LE strengthening  ? ?Attempted standing to play word game (hangman)- and able to stand 453m 43 sec prior to requesting to sit due to weakness and c/o of mild headache.  ? ?Seated Hip march  3lb AW x 10 reps BLE (patient reports as medium)  ? ?Seated knee ext 3lb AW x 10 reps (Patient reports as medium- VC for full ROM)  ?  ?Seated hip add - ball squeeze with 5 sec hold x 10 reps ? ?Seated hip abd - with use  of GTB- 10 reps ? ?Seated hip flex/abd with 3# up and over hedgehog x 10 reps each LE ?  ?Hamstring curl- using GTB BLE x 10 reps ?  ?Calf raises - 3lb AW BLE x 10 reps ?  ?Toe raises- 3lb AW BLE x 10 reps ? ?Short rest break between each exercise and then performed a 2nd set of Toe raises, Calf raises and seated hip march before requesting to finish due to undo fatigue.  ? ?Education provided throughout session via VC/TC and demonstration to facilitate movement at target joints and correct muscle activation for all testing and exercises performed.  ?  ?  ? ? ? ? ? ? ? ? ? ? ? ? ? ? ? ? ? PT Education - 07/13/21 1107   ? ? Education Details Exercise technique   ? Person(s) Educated Patient   ? Methods Explanation;Demonstration;Tactile cues;Verbal cues   ? Comprehension Verbalized  understanding;Returned demonstration;Tactile cues required;Need further instruction;Verbal cues required   ? ?  ?  ? ?  ? ? ? PT Short Term Goals - 04/29/21 1058   ? ?  ? PT SHORT TERM GOAL #1  ? Title Pt will be independent with HEP in order to improve strength and balance in order to decrease fall risk and improve function at home and work.   ? Baseline 02/02/2021: Patient reports limited HEP in place currently from [facility] agency 12/14 compliance; 1/20/2023Patient states no questions regarding his current exercise regimen and states he is compliant.   ? Time 6   ? Period Weeks   ? Status Achieved   ? Target Date 03/16/21   ? ?  ?  ? ?  ? ? ? ? PT Long Term Goals - 06/08/21 1511   ? ?  ? PT LONG TERM GOAL #1  ? Title Pt will improve FOTO to target score of 50 to display perceived improvements in ability to complete ADL's.   ? Baseline 02/02/2021= 43 12/143: 51%; 04/29/2021= 53%   ? Time 12   ? Period Weeks   ? Status Achieved   ?  ? PT LONG TERM GOAL #2  ? Title Pt will decrease 5TSTS by at least 8 seconds in order to demonstrate clinically significant improvement in LE strength.   ? Baseline 02/02/2021= 28.22 sec with BUE Support 12/14: 23.1 seconds; 04/29/2021=Unable to test today due to patient became nauseous with 6 min walk test and attempted TUG- unable to continue- will reassess next 1-2 visits; 06/08/2021= 25.1 sec with min BUE support   ? Time 12   ? Period Weeks   ? Status Partially Met   ? Target Date 07/22/21   ?  ? PT LONG TERM GOAL #3  ? Title Pt will decrease TUG to below 19 seconds/decrease in order to demonstrate decreased fall risk.   ? Baseline 02/02/2021= 27.30 sec without AD 12/14: 23 seconds; 04/29/2021- Unable to assess as patient became very nauseated upon standing and request to stop treatment. 06/08/2021= 19.10 sec without an AD   ? Time 12   ? Period Weeks   ? Status Partially Met   ? Target Date 07/22/21   ?  ? PT LONG TERM GOAL #4  ? Title Pt will increase 6MWT by at least 54m(1635f in order to  demonstrate clinically significant improvement in cardiopulmonary endurance and community ambulation   ? Baseline 02/02/2021= 83 feet in 1 min 10 sec wihtout an AD 12/14: 200 ft in  40m340mtes; 04/29/2021=Patient ambulated  approx 60 feet yet had to stop due ?onset of nausea and unable to complete today   ? Time 12   ? Period Weeks   ? Status Partially Met   ? Target Date 07/22/21   ?  ? PT LONG TERM GOAL #5  ? Title Pt will increase 10MWT by at least 0.2 m/s in order to demonstrate clinically significant improvement in community ambulation.   ? Baseline 02/02/2021= 0.42 m/s 12/14: 0.49 m/s; 04/29/2021=Patient unable to test today secondary to being nauseated. 06/08/2021= 0.62 m/s   ? Time 12   ? Period Weeks   ? Status Partially Met   ? Target Date 07/22/21   ? ?  ?  ? ?  ? ? ? ? ? ? ? ? Plan - 07/13/21 1141   ? ? Clinical Impression Statement Patient presented to clinic today with good motivation. He started off attempt to focus more on standing functional endurance yet fatigued quickly and complained of mild headache. He was able to rest briefly and continue with LE strengthening. He was able to perform a couple of exercises with more resistance today and even began a second round but fatigued out after 3 exercises of round 2. His progress is slow as he continues to be very limited by stomach issues and fatigue. Patient will continue to benefit from skilled physical therapy intervention in order to improve his strength, mobility, and overall quality of life   ? Personal Factors and Comorbidities Comorbidity 3+   ? Comorbidities DM, Liver transplant, ESRD, CAD   ? Examination-Activity Limitations Carry;Lift;Reach Overhead;Squat;Stairs;Stand;Transfers;Toileting   ? Examination-Participation Restrictions Cleaning;Community Activity;Driving;Medication Management;Meal Prep;V[employer] Work   ? Stability/Clinical Decision Making Evolving/Moderate complexity   ? Rehab Potential Fair   ? PT Frequency 2x / week   ? PT Duration 12 weeks    ? PT Treatment/Interventions ADLs/Self Care Home Management;Cryotherapy;Moist Heat;DME Instruction;Gait training;Stair training;Functional mobility training;Therapeutic activities;Therapeutic exercise;Balance tr

## 2021-07-14 ENCOUNTER — Ambulatory Visit: Payer: Medicare Other

## 2021-07-15 ENCOUNTER — Ambulatory Visit: Payer: Medicare Other

## 2021-07-15 DIAGNOSIS — R269 Unspecified abnormalities of gait and mobility: Secondary | ICD-10-CM | POA: Diagnosis not present

## 2021-07-15 DIAGNOSIS — R2689 Other abnormalities of gait and mobility: Secondary | ICD-10-CM

## 2021-07-15 DIAGNOSIS — M6281 Muscle weakness (generalized): Secondary | ICD-10-CM

## 2021-07-15 DIAGNOSIS — R262 Difficulty in walking, not elsewhere classified: Secondary | ICD-10-CM

## 2021-07-15 DIAGNOSIS — R278 Other lack of coordination: Secondary | ICD-10-CM

## 2021-07-15 DIAGNOSIS — R2681 Unsteadiness on feet: Secondary | ICD-10-CM

## 2021-07-15 NOTE — Therapy (Signed)
[place] [facility] ?[place], [place], [address] ?Phone: [phone]   Fax:  [phone] ? ?Physical Therapy Treatment ? ?Patient Details  ?Name: Alex Hampton ?MRN: [id number] ?Date of Birth: 10-04-1955 ?No data recorded ? ?Encounter Date: 07/15/2021 ? ? PT End of Session - 07/15/21 0933   ? ? Visit Number 29   ? Number of Visits 40   ? Date for PT Re-Evaluation 07/22/21   ? Authorization Type Medicare/BCBS; PN 03/23/21   ? Authorization Time Period Initial Cert= 23/76/2831- 51/76/1607; Recert 3/71/0626-9/48/5462   ? Progress Note Due on Visit 20   ? PT Start Time 0930   ? PT Stop Time 1004   ? PT Time Calculation (min) 34 min   ? Equipment Utilized During Treatment Gait belt   ? Activity Tolerance Patient limited by fatigue;Patient limited by lethargy;Other (comment)   Patient became nauseous with mobility/testing today  ? Behavior During Therapy [facility] for tasks assessed/performed   ? ?  ?  ? ?  ? ? ?Past Medical History:  ?Diagnosis Date  ? Depression   ? History of cardiac cath   ? History of heart artery stent   ? Hyperlipidemia   ? Hypertension   ? MI (myocardial infarction) ([place])   ? Renal disorder   ? ? ?Past Surgical History:  ?Procedure Laterality Date  ? IR GJ TUBE CHANGE  11/22/2020  ? IR GJ TUBE CHANGE  11/26/2020  ? IR REPLC GASTRO/COLONIC TUBE PERCUT W/FLUORO  10/14/2020  ? LEFT HEART CATH AND CORONARY ANGIOGRAPHY N/A 08/19/2020  ? Procedure: LEFT HEART CATH AND CORONARY ANGIOGRAPHY;  Surgeon: Corey Skains, MD;  Location: [facility];  Service: Cardiovascular;  Laterality: N/A;  ? LIVER TRANSPLANT    ? ? ?There were no vitals filed for this visit. ? ? Subjective Assessment - 07/15/21 0933   ? ? Subjective Patient reports feeling some better but continues to have rather constant nausea and continued low energy.   ? Patient is accompained by: Family member   [name] (she brought him but did not come back during eval)  ? Pertinent  History Patient is a 66 year old male with recent referral for abnomality of gait and recent liver transplant in November 2021. He has past medical history significant for Liver transplant (02/18/2020), CKD, End stage renal disease- On hemodialyis T/TH/Sat., Anemia, DM with neuropathy, Non-stemi, Coronary artery disease, Feeding tube with poor appetite.   ? Limitations Standing;Lifting;House hold activities;Walking   ? How long can you sit comfortably? no issues   ? How long can you stand comfortably? < 81mn   ? How long can you walk comfortably? < 2 min   ? Patient Stated Goals I want to return to my normal self- Independent with all mobility, not fall, with improved overall stamina.   ? Currently in Pain? No/denies   ? ?  ?  ? ?  ? ? ?INTERVENTIONS:  ? ?Therapeutic Exercises:  ? ?Standing calf raises- 3#- BLE x 10 reps ?Standing toe raises-3#- BLE x 10 reps ?Standing Hip ext 3#- Alt LE x 10 reps ?Standing Hip abd 3#-Alt LE x 10 reps ?Standing Mini squats - (almost to chair)  x 10 reps ?Standing Ham curls- 3# (alt LE) x 10  reps ? ?6 min walk test = amb for 2 min 25 sec at 330 feet (progressing)  ? ?Patient reported undo fatigue after above activities so session ended early.  ? ?  Education provided throughout session via VC/TC and demonstration to facilitate movement at target joints and correct muscle activation for all testing and exercises performed.  ? ? ? ? ? ? ? ? ? ? ? ? ? ? ? ? ? ? ? ? ? ? ? ? ? PT Education - 07/15/21 1115   ? ? Education Details Standing Exercise technique   ? Person(s) Educated Patient   ? Methods Explanation;Demonstration;Tactile cues;Verbal cues   ? Comprehension Verbalized understanding;Returned demonstration;Verbal cues required;Tactile cues required;Need further instruction   ? ?  ?  ? ?  ? ? ? PT Short Term Goals - 04/29/21 1058   ? ?  ? PT SHORT TERM GOAL #1  ? Title Pt will be independent with HEP in order to improve strength and balance in order to decrease fall risk and improve  function at home and work.   ? Baseline 02/02/2021: Patient reports limited HEP in place currently from [facility] agency 12/14 compliance; 1/20/2023Patient states no questions regarding his current exercise regimen and states he is compliant.   ? Time 6   ? Period Weeks   ? Status Achieved   ? Target Date 03/16/21   ? ?  ?  ? ?  ? ? ? ? PT Long Term Goals - 07/15/21 1116   ? ?  ? PT LONG TERM GOAL #1  ? Title Pt will improve FOTO to target score of 50 to display perceived improvements in ability to complete ADL's.   ? Baseline 02/02/2021= 43 12/143: 51%; 04/29/2021= 53%   ? Time 12   ? Period Weeks   ? Status Achieved   ?  ? PT LONG TERM GOAL #2  ? Title Pt will decrease 5TSTS by at least 8 seconds in order to demonstrate clinically significant improvement in LE strength.   ? Baseline 02/02/2021= 28.22 sec with BUE Support 12/14: 23.1 seconds; 04/29/2021=Unable to test today due to patient became nauseous with 6 min walk test and attempted TUG- unable to continue- will reassess next 1-2 visits; 06/08/2021= 25.1 sec with min BUE support   ? Time 12   ? Period Weeks   ? Status Partially Met   ? Target Date 07/22/21   ?  ? PT LONG TERM GOAL #3  ? Title Pt will decrease TUG to below 19 seconds/decrease in order to demonstrate decreased fall risk.   ? Baseline 02/02/2021= 27.30 sec without AD 12/14: 23 seconds; 04/29/2021- Unable to assess as patient became very nauseated upon standing and request to stop treatment. 06/08/2021= 19.10 sec without an AD   ? Time 12   ? Period Weeks   ? Status Partially Met   ? Target Date 07/22/21   ?  ? PT LONG TERM GOAL #4  ? Title Pt will increase 6MWT by at least 38m(1687f in order to demonstrate clinically significant improvement in cardiopulmonary endurance and community ambulation   ? Baseline 02/02/2021= 83 feet in 1 min 10 sec wihtout an AD 12/14: 200 ft in  56m29mtes; 04/29/2021=Patient ambulated approx 60 feet yet had to stop due onset of nausea and unable to complete today; 07/15/2021= 330  feet in 2 min 25 sec without AD   ? Time 12   ? Period Weeks   ? Status Partially Met   ? Target Date 07/22/21   ?  ? PT LONG TERM GOAL #5  ? Title Pt will increase 10MWT by at least 0.2 m/s in order to demonstrate clinically significant improvement  in community ambulation.   ? Baseline 02/02/2021= 0.42 m/s 12/14: 0.49 m/s; 04/29/2021=Patient unable to test today secondary to being nauseated. 06/08/2021= 0.62 m/s   ? Time 12   ? Period Weeks   ? Status Partially Met   ? Target Date 07/22/21   ? ?  ?  ? ?  ? ? ? ? ? ? ? ? Plan - 07/15/21 0937   ? ? Clinical Impression Statement Patient presented with good motivation overall today. He was able to participate more in standing than previous visits with overall less rest breaks. He was also able to walk today and performed the 6 min walk test. He is still limited by fatigue yet exhibited progressing distance and quality of gait as seen by marked improvement in gait distance. Patient will continue to benefit from skilled physical therapy intervention in order to improve his strength, mobility, and overall quality of life   ? Personal Factors and Comorbidities Comorbidity 3+   ? Comorbidities DM, Liver transplant, ESRD, CAD   ? Examination-Activity Limitations Carry;Lift;Reach Overhead;Squat;Stairs;Stand;Transfers;Toileting   ? Examination-Participation Restrictions Cleaning;Community Activity;Driving;Medication Management;Meal Prep;V[employer] Le[employer] Work   ? Stability/Clinical Decision Making Evolving/Moderate complexity   ? Rehab Potential Fair   ? PT Frequency 2x / week   ? PT Duration 12 weeks   ? PT Treatment/Interventions ADLs/Self Care Home Management;Cryotherapy;Moist Heat;DME Instruction;Gait training;Stair training;Functional mobility training;Therapeutic activities;Therapeutic exercise;Balance training;Neuromuscular re-education;Patient/family education;Wheelchair mobility training;Manual techniques;Passive range of motion;Dry needling;Energy conservation;Joint Manipulations    ? PT Next Visit Plan Progressive Therapeutic exercises, Balance training, transfer/gait training   ? PT Home Exercise Plan No changes or updates today.   ? Consulted and Agree with Plan of Care Patient   ? ?

## 2021-07-20 ENCOUNTER — Ambulatory Visit: Payer: Medicare Other

## 2021-07-20 DIAGNOSIS — R278 Other lack of coordination: Secondary | ICD-10-CM

## 2021-07-20 DIAGNOSIS — R269 Unspecified abnormalities of gait and mobility: Secondary | ICD-10-CM | POA: Diagnosis not present

## 2021-07-20 DIAGNOSIS — R2689 Other abnormalities of gait and mobility: Secondary | ICD-10-CM

## 2021-07-20 DIAGNOSIS — R296 Repeated falls: Secondary | ICD-10-CM

## 2021-07-20 DIAGNOSIS — M6281 Muscle weakness (generalized): Secondary | ICD-10-CM

## 2021-07-20 DIAGNOSIS — R2681 Unsteadiness on feet: Secondary | ICD-10-CM

## 2021-07-20 DIAGNOSIS — R262 Difficulty in walking, not elsewhere classified: Secondary | ICD-10-CM

## 2021-07-20 NOTE — Therapy (Signed)
[facility] ?[place], [place], [address] ?Phone: [phone]   Fax:  [phone] ? ?Physical Therapy Treatment/Recertification for dates 07/20/2021- 10/12/2021/Physical Therapy Progress Note ? ? ?Dates of reporting period  06/08/2021  to   07/20/2021 ? ?Patient Details  ?Name: Alex Hampton ?MRN: [id number] ?Date of Birth: 1955-07-07 ?No data recorded ? ?Encounter Date: 07/20/2021 ? ? PT End of Session - 07/20/21 1500   ? ? Visit Number 30   ? Number of Visits 40   ? Date for PT Re-Evaluation 10/12/21   ? Authorization Type Medicare/BCBS; PN 03/23/21   ? Authorization Time Period Initial Cert= 82/42/3536- 14/43/1540; Recert 0/86/7619-08/16/3265: Recert 05/04/5807- 12/16/3380   ? Progress Note Due on Visit 40   ? PT Start Time 5053   ? PT Stop Time 9767   ? PT Time Calculation (min) 38 min   ? Equipment Utilized During Treatment Gait belt   ? Activity Tolerance Patient limited by fatigue;Patient limited by lethargy;Other (comment)   Patient became nauseous with mobility/testing today  ? Behavior During Therapy [facility] for tasks assessed/performed   ? ?  ?  ? ?  ? ? ?Past Medical History:  ?Diagnosis Date  ? Depression   ? History of cardiac cath   ? History of heart artery stent   ? Hyperlipidemia   ? Hypertension   ? MI (myocardial infarction) ([place])   ? Renal disorder   ? ? ?Past Surgical History:  ?Procedure Laterality Date  ? IR GJ TUBE CHANGE  11/22/2020  ? IR GJ TUBE CHANGE  11/26/2020  ? IR REPLC GASTRO/COLONIC TUBE PERCUT W/FLUORO  10/14/2020  ? LEFT HEART CATH AND CORONARY ANGIOGRAPHY N/A 08/19/2020  ? Procedure: LEFT HEART CATH AND CORONARY ANGIOGRAPHY;  Surgeon: Corey Skains, MD;  Location: [facility];  Service: Cardiovascular;  Laterality: N/A;  ? LIVER TRANSPLANT    ? ? ?There were no vitals filed for this visit. ? ? Subjective Assessment - 07/20/21 1521   ? ? Patient is accompained by: Family member   [name] (she brought him but did not  come back during eval)  ? Pertinent History Patient is a 66 year old male with recent referral for abnomality of gait and recent liver transplant in November 2021. He has past medical history significant for Liver transplant (02/18/2020), CKD, End stage renal disease- On hemodialyis T/TH/Sat., Anemia, DM with neuropathy, Non-stemi, Coronary artery disease, Feeding tube with poor appetite.   ? Limitations Standing;Lifting;House hold activities;Walking   ? How long can you sit comfortably? no issues   ? How long can you stand comfortably? < 105mn   ? How long can you walk comfortably? < 2 min   ? Patient Stated Goals I want to return to my normal self- Independent with all mobility, not fall, with improved overall stamina.   ? ?  ?  ? ?  ? ? ? ? ?INTERVENTIONS:  ? ?Reassessment of goals for PT recer/progress note ? ?5 time STS= 17.75 with BUE support (min)  ? ?TUG= 15.68 sec without AD (GOAL MET) ? ?10MWT= 0.87 m/s without an AD (GOAL MET)  ? ? ?Seated LE Strengthening: ?Hip march 2.5 lb BLE x 10 reps  ?Knee ext 2.5 lb BLE x 10 reps ?Hip abd 2.5 lb BLE x 10 reps ?Heel Raises 2.5 lb X 10 reps ?Toe raises 2.5 lb x 10 reps ?Ham curls GTB x 10 reps BLE.  ? ?Education provided  throughout session via VC/TC and demonstration to facilitate movement at target joints and correct muscle activation for all testing and exercises performed.  ?   ? ? ? ? ? ? ? ? ? ? ? ? ? ? ? ? ? ? ? PT Short Term Goals - 04/29/21 1058   ? ?  ? PT SHORT TERM GOAL #1  ? Title Pt will be independent with HEP in order to improve strength and balance in order to decrease fall risk and improve function at home and work.   ? Baseline 02/02/2021: Patient reports limited HEP in place currently from [facility] agency 12/14 compliance; 1/20/2023Patient states no questions regarding his current exercise regimen and states he is compliant.   ? Time 6   ? Period Weeks   ? Status Achieved   ? Target Date 03/16/21   ? ?  ?  ? ?  ? ? ? ? PT Long Term Goals - 07/20/21 1526    ? ?  ? PT LONG TERM GOAL #1  ? Title Pt will improve FOTO to target score of 50 to display perceived improvements in ability to complete ADL's.   ? Baseline 02/02/2021= 43 12/143: 51%; 04/29/2021= 53%   ? Time 12   ? Period Weeks   ? Status Achieved   ?  ? PT LONG TERM GOAL #2  ? Title Pt will decrease 5TSTS by at least 8 seconds in order to demonstrate clinically significant improvement in LE strength.   ? Baseline 02/02/2021= 28.22 sec with BUE Support 12/14: 23.1 seconds; 04/29/2021=Unable to test today due to patient became nauseous with 6 min walk test and attempted TUG- unable to continue- will reassess next 1-2 visits; 06/08/2021= 25.1 sec with min BUE support; 07/20/2021= 17.75 with min BUE support   ? Time 12   ? Period Weeks   ? Status Achieved   ? Target Date 07/22/21   ?  ? PT LONG TERM GOAL #3  ? Title Pt will decrease TUG to below 19 seconds/decrease in order to demonstrate decreased fall risk.   ? Baseline 02/02/2021= 27.30 sec without AD 12/14: 23 seconds; 04/29/2021- Unable to assess as patient became very nauseated upon standing and request to stop treatment. 06/08/2021= 19.10 sec without an AD; 07/20/2021= 15.68 sec without UE Support   ? Time 12   ? Period Weeks   ? Status Achieved   ? Target Date 07/22/21   ?  ? PT LONG TERM GOAL #4  ? Title Pt will increase 6MWT by at least 61m(162f in order to demonstrate clinically significant improvement in cardiopulmonary endurance and community ambulation   ? Baseline 02/02/2021= 83 feet in 1 min 10 sec wihtout an AD 12/14: 200 ft in  74m20mtes; 04/29/2021=Patient ambulated approx 60 feet yet had to stop due onset of nausea and unable to complete today; 07/15/2021= 330 feet in 2 min 25 sec without AD   ? Time 12   ? Period Weeks   ? Status Partially Met   ? Target Date 07/22/21   ?  ? PT LONG TERM GOAL #5  ? Title Pt will increase 10MWT by at least 0.2 m/s in order to demonstrate clinically significant improvement in community ambulation.   ? Baseline 02/02/2021=  0.42 m/s 12/14: 0.49 m/s; 04/29/2021=Patient unable to test today secondary to being nauseated. 06/08/2021= 0.62 m/s; 07/20/2021= 0.87 m/s   ? Time 12   ? Period Weeks   ? Status Achieved   ? Target Date  07/22/21   ?  ? Additional Long Term Goals  ? Additional Long Term Goals Yes   ?  ? PT LONG TERM GOAL #6  ? Title Pt will decrease 5TSTS by at least 3 seconds in order to demonstrate clinically significant improvement in LE strength.   ? Baseline 07/20/2021= 17.75 sec with Minimal BUE Support   ? Time 12   ? Period Weeks   ? Status New   ? Target Date 10/12/21   ? ?  ?  ? ?  ? ? ? ? ? ? ? ? Plan - 07/21/21 1502   ? ? Clinical Impression Statement Patient presents with good motivation and able to increase participation well. He is demonstrating progress as seen by progression of goas and even met his current strength, TUG, and 10MWT indicating improved overall strength and mobility. Despite these gains he is still deconditioned and limited by weakness and faitgue.Patient's condition has the potential to improve in response to therapy. Maximum improvement is yet to be obtained. The anticipated improvement is attainable and reasonable in a generally predictable time.   ? Personal Factors and Comorbidities Comorbidity 3+   ? Comorbidities DM, Liver transplant, ESRD, CAD   ? Examination-Activity Limitations Carry;Lift;Reach Overhead;Squat;Stairs;Stand;Transfers;Toileting   ? Examination-Participation Restrictions Cleaning;Community Activity;Driving;Medication Management;Meal Prep;[employer] Work   ? Stability/Clinical Decision Making Evolving/Moderate complexity   ? Rehab Potential Fair   ? PT Frequency 2x / week   ? PT Duration 12 weeks   ? PT Treatment/Interventions ADLs/Self Care Home Management;Cryotherapy;Moist Heat;DME Instruction;Gait training;Stair training;Functional mobility training;Therapeutic activities;Therapeutic exercise;Balance training;Neuromuscular re-education;Patient/family education;Wheelchair mobility  training;Manual techniques;Passive range of motion;Dry needling;Energy conservation;Joint Manipulations   ? PT Next Visit Plan Progressive Therapeutic exercises, Balance training, transfer/gait training   ?

## 2021-07-21 ENCOUNTER — Ambulatory Visit: Payer: Medicare Other | Admitting: Physical Therapy

## 2021-07-22 ENCOUNTER — Ambulatory Visit: Payer: Medicare Other

## 2021-07-22 DIAGNOSIS — M6281 Muscle weakness (generalized): Secondary | ICD-10-CM

## 2021-07-22 DIAGNOSIS — R2681 Unsteadiness on feet: Secondary | ICD-10-CM

## 2021-07-22 DIAGNOSIS — R262 Difficulty in walking, not elsewhere classified: Secondary | ICD-10-CM

## 2021-07-22 DIAGNOSIS — R269 Unspecified abnormalities of gait and mobility: Secondary | ICD-10-CM | POA: Diagnosis not present

## 2021-07-22 DIAGNOSIS — R278 Other lack of coordination: Secondary | ICD-10-CM

## 2021-07-22 DIAGNOSIS — R296 Repeated falls: Secondary | ICD-10-CM

## 2021-07-22 DIAGNOSIS — R2689 Other abnormalities of gait and mobility: Secondary | ICD-10-CM

## 2021-07-22 NOTE — Therapy (Signed)
[facility] ?[place], [place], [address] ?Phone: [phone]   Fax:  [phone] ? ?Physical Therapy Treatment ? ?Patient Details  ?Name: Alex Hampton ?MRN: [id number] ?Date of Birth: 12-Aug-1955 ?No data recorded ? ?Encounter Date: 07/22/2021 ? ? PT End of Session - 07/22/21 0942   ? ? Visit Number 31   ? Number of Visits 40   ? Date for PT Re-Evaluation 10/12/21   ? Authorization Type Medicare/BCBS; PN 03/23/21   ? Authorization Time Period Initial Cert= 50/93/2671- 24/58/0998; Recert 3/38/2505-3/97/6734: Recert 1/93/7902- 4/0/9735   ? Progress Note Due on Visit 40   ? PT Start Time 0930   ? PT Stop Time 1004   ? PT Time Calculation (min) 34 min   ? Equipment Utilized During Treatment Gait belt   ? Activity Tolerance Patient limited by fatigue;Patient limited by lethargy;Other (comment)   Patient became nauseous with mobility/testing today  ? Behavior During Therapy [facility] for tasks assessed/performed   ? ?  ?  ? ?  ? ? ?Past Medical History:  ?Diagnosis Date  ? Depression   ? History of cardiac cath   ? History of heart artery stent   ? Hyperlipidemia   ? Hypertension   ? MI (myocardial infarction) ([place])   ? Renal disorder   ? ? ?Past Surgical History:  ?Procedure Laterality Date  ? IR GJ TUBE CHANGE  11/22/2020  ? IR GJ TUBE CHANGE  11/26/2020  ? IR REPLC GASTRO/COLONIC TUBE PERCUT W/FLUORO  10/14/2020  ? LEFT HEART CATH AND CORONARY ANGIOGRAPHY N/A 08/19/2020  ? Procedure: LEFT HEART CATH AND CORONARY ANGIOGRAPHY;  Surgeon: Corey Skains, MD;  Location: [facility];  Service: Cardiovascular;  Laterality: N/A;  ? LIVER TRANSPLANT    ? ? ?There were no vitals filed for this visit. ? ? Subjective Assessment - 07/22/21 0941   ? ? Subjective Patient reports feeling okay- stomach is upset but otherwise not bad.   ? Patient is accompained by: Family member   [name] (she brought him but did not come back during eval)  ? Pertinent History  Patient is a 66 year old male with recent referral for abnomality of gait and recent liver transplant in November 2021. He has past medical history significant for Liver transplant (02/18/2020), CKD, End stage renal disease- On hemodialyis T/TH/Sat., Anemia, DM with neuropathy, Non-stemi, Coronary artery disease, Feeding tube with poor appetite.   ? Limitations Standing;Lifting;House hold activities;Walking   ? How long can you sit comfortably? no issues   ? How long can you stand comfortably? < 61mn   ? How long can you walk comfortably? < 2 min   ? Patient Stated Goals I want to return to my normal self- Independent with all mobility, not fall, with improved overall stamina.   ? Currently in Pain? No/denies   ? ?  ?  ? ?  ? ? ?Therapeutic Exercises:  ? ?Standing hip march alt LE x 10 reps ?Seated Hip march/ABD up/over hedgehog x 10 reps BLE ?Standing step up alt LE x 10 reps ?Seated calf raise BLE x 10 reps ?Standing mini squat x 10 reps ?Seated knee ext alt LE x 10 reps ?Ladder drill -forward (1 foot per square x length of // bars and back x 2) - stopped secondary to fatigue.  ?O2 sat= 97% and HR= 80 bpm. Modified RPE= 4/10 ?Ladder drill -side step x length of // bars and back  ?  O2 sat= 97% and HR= 84 bpm ?Backward ladder drill  x length of //bars  ? ?Patient was very fatigued so stopped treatment.  ? ? ? ? ? ? ? ? ? ? ? ? ? ? ? ? ? ? ? ? ? ? ? ? ? PT Education - 07/22/21 0941   ? ? Education Details Standing/seated exercise technique   ? Person(s) Educated Patient   ? Methods Explanation;Demonstration;Tactile cues;Verbal cues   ? Comprehension Verbalized understanding;Returned demonstration;Verbal cues required;Tactile cues required;Need further instruction   ? ?  ?  ? ?  ? ? ? PT Short Term Goals - 04/29/21 1058   ? ?  ? PT SHORT TERM GOAL #1  ? Title Pt will be independent with HEP in order to improve strength and balance in order to decrease fall risk and improve function at home and work.   ? Baseline  02/02/2021: Patient reports limited HEP in place currently from [facility] agency 12/14 compliance; 1/20/2023Patient states no questions regarding his current exercise regimen and states he is compliant.   ? Time 6   ? Period Weeks   ? Status Achieved   ? Target Date 03/16/21   ? ?  ?  ? ?  ? ? ? ? PT Long Term Goals - 07/20/21 1526   ? ?  ? PT LONG TERM GOAL #1  ? Title Pt will improve FOTO to target score of 50 to display perceived improvements in ability to complete ADL's.   ? Baseline 02/02/2021= 43 12/143: 51%; 04/29/2021= 53%   ? Time 12   ? Period Weeks   ? Status Achieved   ?  ? PT LONG TERM GOAL #2  ? Title Pt will decrease 5TSTS by at least 8 seconds in order to demonstrate clinically significant improvement in LE strength.   ? Baseline 02/02/2021= 28.22 sec with BUE Support 12/14: 23.1 seconds; 04/29/2021=Unable to test today due to patient became nauseous with 6 min walk test and attempted TUG- unable to continue- will reassess next 1-2 visits; 06/08/2021= 25.1 sec with min BUE support; 07/20/2021= 17.75 with min BUE support   ? Time 12   ? Period Weeks   ? Status Achieved   ? Target Date 07/22/21   ?  ? PT LONG TERM GOAL #3  ? Title Pt will decrease TUG to below 19 seconds/decrease in order to demonstrate decreased fall risk.   ? Baseline 02/02/2021= 27.30 sec without AD 12/14: 23 seconds; 04/29/2021- Unable to assess as patient became very nauseated upon standing and request to stop treatment. 06/08/2021= 19.10 sec without an AD; 07/20/2021= 15.68 sec without UE Support   ? Time 12   ? Period Weeks   ? Status Achieved   ? Target Date 07/22/21   ?  ? PT LONG TERM GOAL #4  ? Title Pt will increase 6MWT by at least 60m(1661f in order to demonstrate clinically significant improvement in cardiopulmonary endurance and community ambulation   ? Baseline 02/02/2021= 83 feet in 1 min 10 sec wihtout an AD 12/14: 200 ft in  34m71mtes; 04/29/2021=Patient ambulated approx 60 feet yet had to stop due onset of nausea and unable to  complete today; 07/15/2021= 330 feet in 2 min 25 sec without AD   ? Time 12   ? Period Weeks   ? Status Partially Met   ? Target Date 07/22/21   ?  ? PT LONG TERM GOAL #5  ? Title Pt will increase 10MWT by at  least 0.2 m/s in order to demonstrate clinically significant improvement in community ambulation.   ? Baseline 02/02/2021= 0.42 m/s 12/14: 0.49 m/s; 04/29/2021=Patient unable to test today secondary to being nauseated. 06/08/2021= 0.62 m/s; 07/20/2021= 0.87 m/s   ? Time 12   ? Period Weeks   ? Status Achieved   ? Target Date 07/22/21   ?  ? Additional Long Term Goals  ? Additional Long Term Goals Yes   ?  ? PT LONG TERM GOAL #6  ? Title Pt will decrease 5TSTS by at least 3 seconds in order to demonstrate clinically significant improvement in LE strength.   ? Baseline 07/20/2021= 17.75 sec with Minimal BUE Support   ? Time 12   ? Period Weeks   ? Status New   ? Target Date 10/12/21   ? ?  ?  ? ?  ? ? ? ? ? ? ? ? Plan - 07/22/21 0942   ? ? Clinical Impression Statement Overall, patient performed well today. He was motivated and participated in combination of seated and standing activities to challenge his strength and endurance. He was able to stand longer and progress his overall strength today with brief rest breaks. Patient will continue to benefit from skilled physical therapy intervention in order to improve his strength, mobility, and overall quality of life   ? Personal Factors and Comorbidities Comorbidity 3+   ? Comorbidities DM, Liver transplant, ESRD, CAD   ? Examination-Activity Limitations Carry;Lift;Reach Overhead;Squat;Stairs;Stand;Transfers;Toileting   ? Examination-Participation Restrictions Cleaning;Community Activity;Driving;Medication Management;Meal Prep;V[employer] Le[employer] Work   ? Stability/Clinical Decision Making Evolving/Moderate complexity   ? Rehab Potential Fair   ? PT Frequency 2x / week   ? PT Duration 12 weeks   ? PT Treatment/Interventions ADLs/Self Care Home Management;Cryotherapy;Moist Heat;DME  Instruction;Gait training;Stair training;Functional mobility training;Therapeutic activities;Therapeutic exercise;Balance training;Neuromuscular re-education;Patient/family education;Wheelchair mobility training;Manual

## 2021-07-26 ENCOUNTER — Ambulatory Visit: Payer: Medicare Other

## 2021-07-27 ENCOUNTER — Ambulatory Visit: Payer: Medicare Other

## 2021-07-28 ENCOUNTER — Ambulatory Visit: Payer: Medicare Other

## 2021-07-29 ENCOUNTER — Ambulatory Visit: Payer: Medicare Other

## 2021-07-29 DIAGNOSIS — R2681 Unsteadiness on feet: Secondary | ICD-10-CM

## 2021-07-29 DIAGNOSIS — R278 Other lack of coordination: Secondary | ICD-10-CM

## 2021-07-29 DIAGNOSIS — R296 Repeated falls: Secondary | ICD-10-CM

## 2021-07-29 DIAGNOSIS — M6281 Muscle weakness (generalized): Secondary | ICD-10-CM

## 2021-07-29 DIAGNOSIS — R262 Difficulty in walking, not elsewhere classified: Secondary | ICD-10-CM

## 2021-07-29 DIAGNOSIS — R2689 Other abnormalities of gait and mobility: Secondary | ICD-10-CM

## 2021-07-29 DIAGNOSIS — R269 Unspecified abnormalities of gait and mobility: Secondary | ICD-10-CM | POA: Diagnosis not present

## 2021-07-29 NOTE — Therapy (Signed)
[facility] ?[place], [place], [address] ?Phone: [phone]   Fax:  [phone] ? ?Physical Therapy Treatment ? ?Patient Details  ?Name: Alex Hampton ?MRN: [id number] ?Date of Birth: Feb 09, 1956 ?No data recorded ? ?Encounter Date: 07/29/2021 ? ? PT End of Session - 07/29/21 0949   ? ? Visit Number 32   ? Number of Visits 40   ? Date for PT Re-Evaluation 10/12/21   ? Authorization Type Medicare/BCBS; PN 03/23/21   ? Authorization Time Period Initial Cert= 81/04/7508- 25/85/2778; Recert 2/42/3536-1/44/3154: Recert 0/11/6759- 12/14/930   ? Progress Note Due on Visit 40   ? PT Start Time [phone]   ? PT Stop Time 1014   ? PT Time Calculation (min) 32 min   ? Equipment Utilized During Treatment Gait belt   ? Activity Tolerance Patient limited by fatigue;Patient limited by lethargy;Other (comment)   Patient became nauseous with mobility/testing today  ? Behavior During Therapy [facility] for tasks assessed/performed   ? ?  ?  ? ?  ? ? ?Past Medical History:  ?Diagnosis Date  ? Depression   ? History of cardiac cath   ? History of heart artery stent   ? Hyperlipidemia   ? Hypertension   ? MI (myocardial infarction) ([place])   ? Renal disorder   ? ? ?Past Surgical History:  ?Procedure Laterality Date  ? IR GJ TUBE CHANGE  11/22/2020  ? IR GJ TUBE CHANGE  11/26/2020  ? IR REPLC GASTRO/COLONIC TUBE PERCUT W/FLUORO  10/14/2020  ? LEFT HEART CATH AND CORONARY ANGIOGRAPHY N/A 08/19/2020  ? Procedure: LEFT HEART CATH AND CORONARY ANGIOGRAPHY;  Surgeon: Corey Skains, MD;  Location: [facility];  Service: Cardiovascular;  Laterality: N/A;  ? LIVER TRANSPLANT    ? ? ?There were no vitals filed for this visit. ? ? Subjective Assessment - 07/29/21 0947   ? ? Subjective Patient reports feeling worn down today "Like I just woke up and moving slow."   ? Patient is accompained by: Family member   Dtr- Alex Hampton (she brought him but did not come back during eval)  ? Pertinent  History Patient is a 66 year old male with recent referral for abnomality of gait and recent liver transplant in November 2021. He has past medical history significant for Liver transplant (02/18/2020), CKD, End stage renal disease- On hemodialyis T/TH/Sat., Anemia, DM with neuropathy, Non-stemi, Coronary artery disease, Feeding tube with poor appetite.   ? Limitations Standing;Lifting;House hold activities;Walking   ? How long can you sit comfortably? no issues   ? How long can you stand comfortably? < 74mn   ? How long can you walk comfortably? < 2 min   ? Patient Stated Goals I want to return to my normal self- Independent with all mobility, not fall, with improved overall stamina.   ? Currently in Pain? No/denies   ? ?  ?  ? ?  ? ? ? ? ?INTERVENTIONS: ? ?Nustep L0 with RUE/BLE at seat 12/ arms at 12 ?Total of 5 min for muscle endurance and cardiovascular response ?O2 sat=99 ; HR= 87 bpm; RPE=4 /10 ? ?Standing hip march alt LE  using 2.5lb AW x 10 reps ?Short walk with 2.5 lb 44 feet ?Standing calf raise 2.5 lb. AW x 10 reps  ?Short walk with 2.5 lb 44 feet ?Standing Hip ext 2.5 lb AW alt LE x 10 reps ?Short walk with 2.5 lb 44 feet ?Standing hip abd  2.5 lb AW alt LE x 10 reps ?Short walk with 2.5 lb 44 feet ?. O2 sat= 98% and HR= 93 bpm. Modified RPE= 4/10 ? ?Sit to stand x 5 with min BUE support ?Short walk with 2.5 lb 44 feet ?  ? ?Education provided throughout session via VC/TC and demonstration to facilitate movement at target joints and correct muscle activation for all testing and exercises performed.  ? ? ? ? ? ? ? ? ? ? ? ? ? ? ? ? ? ? ? PT Education - 07/29/21 0947   ? ? Education Details Function endurance technique; use of BORG scale   ? Person(s) Educated Patient   ? Methods Explanation   ? Comprehension Verbalized understanding;Returned demonstration;Verbal cues required;Tactile cues required;Need further instruction   ? ?  ?  ? ?  ? ? ? PT Short Term Goals - 04/29/21 1058   ? ?  ? PT SHORT TERM  GOAL #1  ? Title Pt will be independent with HEP in order to improve strength and balance in order to decrease fall risk and improve function at home and work.   ? Baseline 02/02/2021: Patient reports limited HEP in place currently from [facility] agency 12/14 compliance; 1/20/2023Patient states no questions regarding his current exercise regimen and states he is compliant.   ? Time 6   ? Period Weeks   ? Status Achieved   ? Target Date 03/16/21   ? ?  ?  ? ?  ? ? ? ? PT Long Term Goals - 07/20/21 1526   ? ?  ? PT LONG TERM GOAL #1  ? Title Pt will improve FOTO to target score of 50 to display perceived improvements in ability to complete ADL's.   ? Baseline 02/02/2021= 43 12/143: 51%; 04/29/2021= 53%   ? Time 12   ? Period Weeks   ? Status Achieved   ?  ? PT LONG TERM GOAL #2  ? Title Pt will decrease 5TSTS by at least 8 seconds in order to demonstrate clinically significant improvement in LE strength.   ? Baseline 02/02/2021= 28.22 sec with BUE Support 12/14: 23.1 seconds; 04/29/2021=Unable to test today due to patient became nauseous with 6 min walk test and attempted TUG- unable to continue- will reassess next 1-2 visits; 06/08/2021= 25.1 sec with min BUE support; 07/20/2021= 17.75 with min BUE support   ? Time 12   ? Period Weeks   ? Status Achieved   ? Target Date 07/22/21   ?  ? PT LONG TERM GOAL #3  ? Title Pt will decrease TUG to below 19 seconds/decrease in order to demonstrate decreased fall risk.   ? Baseline 02/02/2021= 27.30 sec without AD 12/14: 23 seconds; 04/29/2021- Unable to assess as patient became very nauseated upon standing and request to stop treatment. 06/08/2021= 19.10 sec without an AD; 07/20/2021= 15.68 sec without UE Support   ? Time 12   ? Period Weeks   ? Status Achieved   ? Target Date 07/22/21   ?  ? PT LONG TERM GOAL #4  ? Title Pt will increase 6MWT by at least 31m(1684f in order to demonstrate clinically significant improvement in cardiopulmonary endurance and community ambulation   ? Baseline  02/02/2021= 83 feet in 1 min 10 sec wihtout an AD 12/14: 200 ft in  52m60mtes; 04/29/2021=Patient ambulated approx 60 feet yet had to stop due onset of nausea and unable to complete today; 07/15/2021= 330 feet in 2 min 25 sec without  AD   ? Time 12   ? Period Weeks   ? Status Partially Met   ? Target Date 07/22/21   ?  ? PT LONG TERM GOAL #5  ? Title Pt will increase 10MWT by at least 0.2 m/s in order to demonstrate clinically significant improvement in community ambulation.   ? Baseline 02/02/2021= 0.42 m/s 12/14: 0.49 m/s; 04/29/2021=Patient unable to test today secondary to being nauseated. 06/08/2021= 0.62 m/s; 07/20/2021= 0.87 m/s   ? Time 12   ? Period Weeks   ? Status Achieved   ? Target Date 07/22/21   ?  ? Additional Long Term Goals  ? Additional Long Term Goals Yes   ?  ? PT LONG TERM GOAL #6  ? Title Pt will decrease 5TSTS by at least 3 seconds in order to demonstrate clinically significant improvement in LE strength.   ? Baseline 07/20/2021= 17.75 sec with Minimal BUE Support   ? Time 12   ? Period Weeks   ? Status New   ? Target Date 10/12/21   ? ?  ?  ? ?  ? ? ? ? ? ? ? ? Plan - 07/29/21 0949   ? ? Clinical Impression Statement Patient  presented with good motivation today. He was able to demonstrate progress again today with improved overall standing endurance and working with some resistance with standing LE strengthening. Patient utilized RPE scale well without significant difficulty. Patient will continue to benefit from skilled physical therapy intervention in order to improve his strength, mobility, and overall quality of life.   ? Personal Factors and Comorbidities Comorbidity 3+   ? Comorbidities DM, Liver transplant, ESRD, CAD   ? Examination-Activity Limitations Carry;Lift;Reach Overhead;Squat;Stairs;Stand;Transfers;Toileting   ? Examination-Participation Restrictions Cleaning;Community Activity;Driving;Medication Management;Meal Prep;[employer] Work   ? Stability/Clinical Decision Making Evolving/Moderate  complexity   ? Rehab Potential Fair   ? PT Frequency 2x / week   ? PT Duration 12 weeks   ? PT Treatment/Interventions ADLs/Self Care Home Management;Cryotherapy;Moist Heat;DME Instruction;Gait training;Stai

## 2021-08-03 ENCOUNTER — Ambulatory Visit: Payer: Medicare Other

## 2021-08-03 DIAGNOSIS — R2689 Other abnormalities of gait and mobility: Secondary | ICD-10-CM

## 2021-08-03 DIAGNOSIS — M6281 Muscle weakness (generalized): Secondary | ICD-10-CM

## 2021-08-03 DIAGNOSIS — R269 Unspecified abnormalities of gait and mobility: Secondary | ICD-10-CM

## 2021-08-03 DIAGNOSIS — R262 Difficulty in walking, not elsewhere classified: Secondary | ICD-10-CM

## 2021-08-03 NOTE — Therapy (Signed)
[facility] [facility] ?[place], [place], [address] ?Phone: [phone]   Fax:  [phone] ? ?Physical Therapy Treatment ? ?Patient Details  ?Name: Alex Hampton ?MRN: [id number] ?Date of Birth: Jul 11, 1955 ?No data recorded ? ?Encounter Date: 08/03/2021 ? ? PT End of Session - 08/03/21 1524   ? ? Visit Number 33   ? Number of Visits 40   ? Date for PT Re-Evaluation 10/12/21   ? Authorization Type Medicare/BCBS; PN 03/23/21   ? Authorization Time Period Initial Cert= 59/56/3875- 64/33/2951; Recert 8/84/1660-10/08/1599: Recert 0/93/2355- 10/10/2200   ? Progress Note Due on Visit 40   ? PT Start Time 1515   ? PT Stop Time 5427   ? PT Time Calculation (min) 44 min   ? Equipment Utilized During Treatment Gait belt   ? Activity Tolerance Patient limited by fatigue;Patient limited by lethargy;Other (comment)   Patient became nauseous with mobility/testing today  ? Behavior During Therapy [facility] for tasks assessed/performed   ? ?  ?  ? ?  ? ? ?Past Medical History:  ?Diagnosis Date  ? Depression   ? History of cardiac cath   ? History of heart artery stent   ? Hyperlipidemia   ? Hypertension   ? MI (myocardial infarction) ([place])   ? Renal disorder   ? ? ?Past Surgical History:  ?Procedure Laterality Date  ? IR GJ TUBE CHANGE  11/22/2020  ? IR GJ TUBE CHANGE  11/26/2020  ? IR REPLC GASTRO/COLONIC TUBE PERCUT W/FLUORO  10/14/2020  ? LEFT HEART CATH AND CORONARY ANGIOGRAPHY N/A 08/19/2020  ? Procedure: LEFT HEART CATH AND CORONARY ANGIOGRAPHY;  Surgeon: Corey Skains, MD;  Location: [facility];  Service: Cardiovascular;  Laterality: N/A;  ? LIVER TRANSPLANT    ? ? ?There were no vitals filed for this visit. ? ? Subjective Assessment - 08/03/21 1523   ? ? Subjective Patient his shoulder has been aching a lot lately. No falls or LOB since last session.   ? Patient is accompained by: Family member   Dtr- Alex Hampton (she brought him but did not come back during eval)  ?  Pertinent History Patient is a 66 year old male with recent referral for abnomality of gait and recent liver transplant in November 2021. He has past medical history significant for Liver transplant (02/18/2020), CKD, End stage renal disease- On hemodialyis T/TH/Sat., Anemia, DM with neuropathy, Non-stemi, Coronary artery disease, Feeding tube with poor appetite.   ? Limitations Standing;Lifting;House hold activities;Walking   ? How long can you sit comfortably? no issues   ? How long can you stand comfortably? < 64mn   ? How long can you walk comfortably? < 2 min   ? Patient Stated Goals I want to return to my normal self- Independent with all mobility, not fall, with improved overall stamina.   ? Currently in Pain? No/denies   ? ?  ?  ? ?  ? ? ? ?Superset:  ?Exercise 1: lap 60 ft with cues for wide BOS  ?Exercise 2: STS 10x  ? ?Exercise 1: lap 60 ft with cues for widened BOS ?Exercise 2: alternating LAQ 10x each LE  ? ?Exercise 1: lap 60 ft with cues for widened BOS ?Exercise 2: march with GTB around bilateral knees 10x  ? ?Exercise 1: lap 160 ft with close CGA  ?Exercise 2: GTB abduction 15x  ? ? ?Exercise 1: weave between 5 cones x 2 trials ?Exercise 2:  LAQ with ball between feet 10x  ? ?Exercise 1: forward/backwards weave 5 cones x 2 trials  ?Exercise 2: adduction ball squeezes 10x 3 second holds  ?  ?Exercise 1: 6" step toe taps 10x each LE  ?Exercise 2: GTB alternating LAQ with band across ankles 10x each LE ? ?Exercise 1: sit to stand 10x ?Exercise 2: heel toe raises 10x ? ?Education provided throughout session via VC/TC and demonstration to facilitate movement at target joints and correct muscle activation for all testing and exercises performed.  ?  ?  ? ?Patient tolerates supersets of interventions for increased endurance and capacity for functional activities. He does require frequent rest breaks throughout session due to fatigue. Patient is able to ambulate backwards for short durations but this will  continue to be an area of improvement as patient is fearful and has shortened steps. Patient will continue to benefit from skilled physical therapy intervention in order to improve his strength, mobility, and overall quality of life ? ? ? ? ? ? ? ? ? ? ? ? ? ? ? ? ? ? ? ? ? PT Education - 08/03/21 1523   ? ? Education Details exercise technique, body mechanics   ? Person(s) Educated Patient   ? Methods Explanation;Demonstration;Tactile cues;Verbal cues   ? Comprehension Verbalized understanding;Returned demonstration;Tactile cues required;Verbal cues required   ? ?  ?  ? ?  ? ? ? PT Short Term Goals - 04/29/21 1058   ? ?  ? PT SHORT TERM GOAL #1  ? Title Pt will be independent with HEP in order to improve strength and balance in order to decrease fall risk and improve function at home and work.   ? Baseline 02/02/2021: Patient reports limited HEP in place currently from [facility] agency 12/14 compliance; 1/20/2023Patient states no questions regarding his current exercise regimen and states he is compliant.   ? Time 6   ? Period Weeks   ? Status Achieved   ? Target Date 03/16/21   ? ?  ?  ? ?  ? ? ? ? PT Long Term Goals - 07/20/21 1526   ? ?  ? PT LONG TERM GOAL #1  ? Title Pt will improve FOTO to target score of 50 to display perceived improvements in ability to complete ADL's.   ? Baseline 02/02/2021= 43 12/143: 51%; 04/29/2021= 53%   ? Time 12   ? Period Weeks   ? Status Achieved   ?  ? PT LONG TERM GOAL #2  ? Title Pt will decrease 5TSTS by at least 8 seconds in order to demonstrate clinically significant improvement in LE strength.   ? Baseline 02/02/2021= 28.22 sec with BUE Support 12/14: 23.1 seconds; 04/29/2021=Unable to test today due to patient became nauseous with 6 min walk test and attempted TUG- unable to continue- will reassess next 1-2 visits; 06/08/2021= 25.1 sec with min BUE support; 07/20/2021= 17.75 with min BUE support   ? Time 12   ? Period Weeks   ? Status Achieved   ? Target Date 07/22/21   ?  ? PT LONG  TERM GOAL #3  ? Title Pt will decrease TUG to below 19 seconds/decrease in order to demonstrate decreased fall risk.   ? Baseline 02/02/2021= 27.30 sec without AD 12/14: 23 seconds; 04/29/2021- Unable to assess as patient became very nauseated upon standing and request to stop treatment. 06/08/2021= 19.10 sec without an AD; 07/20/2021= 15.68 sec without UE Support   ? Time 12   ?  Period Weeks   ? Status Achieved   ? Target Date 07/22/21   ?  ? PT LONG TERM GOAL #4  ? Title Pt will increase 6MWT by at least 47m(1650f in order to demonstrate clinically significant improvement in cardiopulmonary endurance and community ambulation   ? Baseline 02/02/2021= 83 feet in 1 min 10 sec wihtout an AD 12/14: 200 ft in  8m38mtes; 04/29/2021=Patient ambulated approx 60 feet yet had to stop due onset of nausea and unable to complete today; 07/15/2021= 330 feet in 2 min 25 sec without AD   ? Time 12   ? Period Weeks   ? Status Partially Met   ? Target Date 07/22/21   ?  ? PT LONG TERM GOAL #5  ? Title Pt will increase 10MWT by at least 0.2 m/s in order to demonstrate clinically significant improvement in community ambulation.   ? Baseline 02/02/2021= 0.42 m/s 12/14: 0.49 m/s; 04/29/2021=Patient unable to test today secondary to being nauseated. 06/08/2021= 0.62 m/s; 07/20/2021= 0.87 m/s   ? Time 12   ? Period Weeks   ? Status Achieved   ? Target Date 07/22/21   ?  ? Additional Long Term Goals  ? Additional Long Term Goals Yes   ?  ? PT LONG TERM GOAL #6  ? Title Pt will decrease 5TSTS by at least 3 seconds in order to demonstrate clinically significant improvement in LE strength.   ? Baseline 07/20/2021= 17.75 sec with Minimal BUE Support   ? Time 12   ? Period Weeks   ? Status New   ? Target Date 10/12/21   ? ?  ?  ? ?  ? ? ? ? ? ? ? ? Plan - 08/03/21 1543   ? ? Clinical Impression Statement Patient tolerates supersets of interventions for increased endurance and capacity for functional activities. He does require frequent rest breaks  throughout session due to fatigue. Patient is able to ambulate backwards for short durations but this will continue to be an area of improvement as patient is fearful and has shortened steps. Patient will continue

## 2021-08-04 ENCOUNTER — Ambulatory Visit: Payer: Medicare Other

## 2021-08-05 ENCOUNTER — Ambulatory Visit: Payer: Medicare Other

## 2021-08-05 DIAGNOSIS — R278 Other lack of coordination: Secondary | ICD-10-CM

## 2021-08-05 DIAGNOSIS — R2681 Unsteadiness on feet: Secondary | ICD-10-CM

## 2021-08-05 DIAGNOSIS — R269 Unspecified abnormalities of gait and mobility: Secondary | ICD-10-CM | POA: Diagnosis not present

## 2021-08-05 DIAGNOSIS — R2689 Other abnormalities of gait and mobility: Secondary | ICD-10-CM

## 2021-08-05 DIAGNOSIS — R262 Difficulty in walking, not elsewhere classified: Secondary | ICD-10-CM

## 2021-08-05 DIAGNOSIS — M6281 Muscle weakness (generalized): Secondary | ICD-10-CM

## 2021-08-05 NOTE — Therapy (Signed)
[facility] ?[place], [place], [address] ?Phone: [phone]   Fax:  [phone] ? ?Physical Therapy Treatment ? ?Patient Details  ?Name: Alex Hampton ?MRN: [id number] ?Date of Birth: September 27, 1955 ?No data recorded ? ?Encounter Date: 08/05/2021 ? ? PT End of Session - 08/05/21 0940   ? ? Visit Number 34   ? Number of Visits 40   ? Date for PT Re-Evaluation 10/12/21   ? Authorization Type Medicare/BCBS; PN 03/23/21   ? Authorization Time Period Initial Cert= 95/32/0233- 43/56/8616; Recert 8/37/2902-04/20/5518: Recert 11/10/2334- 04/12/2447   ? Progress Note Due on Visit 40   ? PT Start Time [id number]   ? PT Stop Time 1005   ? PT Time Calculation (min) 31 min   ? Equipment Utilized During Treatment Gait belt   ? Activity Tolerance Patient limited by fatigue;Patient limited by lethargy;Other (comment)   Patient became nauseous with mobility/testing today  ? Behavior During Therapy [facility] for tasks assessed/performed   ? ?  ?  ? ?  ? ? ?Past Medical History:  ?Diagnosis Date  ? Depression   ? History of cardiac cath   ? History of heart artery stent   ? Hyperlipidemia   ? Hypertension   ? MI (myocardial infarction) ([place])   ? Renal disorder   ? ? ?Past Surgical History:  ?Procedure Laterality Date  ? IR GJ TUBE CHANGE  11/22/2020  ? IR GJ TUBE CHANGE  11/26/2020  ? IR REPLC GASTRO/COLONIC TUBE PERCUT W/FLUORO  10/14/2020  ? LEFT HEART CATH AND CORONARY ANGIOGRAPHY N/A 08/19/2020  ? Procedure: LEFT HEART CATH AND CORONARY ANGIOGRAPHY;  Surgeon: Corey Skains, MD;  Location: [facility];  Service: Cardiovascular;  Laterality: N/A;  ? LIVER TRANSPLANT    ? ? ?There were no vitals filed for this visit. ? ? Subjective Assessment - 08/05/21 0939   ? ? Subjective Pt reports still having some arm pain. SOme fatigue and stomach discomfort but pt reports it is at its baseline level.   ? Patient is accompained by: Family member   [name] (she brought him but did not  come back during eval)  ? Pertinent History Patient is a 66 year old male with recent referral for abnomality of gait and recent liver transplant in November 2021. He has past medical history significant for Liver transplant (02/18/2020), CKD, End stage renal disease- On hemodialyis T/TH/Sat., Anemia, DM with neuropathy, Non-stemi, Coronary artery disease, Feeding tube with poor appetite.   ? Limitations Standing;Lifting;House hold activities;Walking   ? How long can you sit comfortably? no issues   ? How long can you stand comfortably? < 1mn   ? How long can you walk comfortably? < 2 min   ? Patient Stated Goals I want to return to my normal self- Independent with all mobility, not fall, with improved overall stamina.   ? Currently in Pain? No/denies   ? ?  ?  ? ?  ? ?There.ex: ? ?Superset:  ?Exercise 1: lap 60 ft with cues for wide BOS  ?Exercise 2: STS 10x, VC's for upright posture and increased glut activation ?  ?Exercise 1: lap 60 ft with cues for widened BOS. VC's for reciprocal arm swing ?Exercise 2: alternating LAQ 10x each LE, 2# AW's/LE ?  ?Exercise 1: lap 60 ft with cues for reciprocal arm swing  ?Exercise 2: march with 4# AW's  ?  ?Exercise 1: lap 160 ft with close CGA  ?  Exercise 2: standing hip abduction with 2# AW's at treadmill bar, x10/LE.   ?  ?  ?Exercise 1: Forward/backward weave between 7 cones x1 trial ?Exercise 2: Heel raises at treadmill bar: x20.  ? ? ? ? ? ? ? ?Seated rest required b/t each exercise and sets of superset exercises due to LE fatigue. Session ended early due to pt reports increased stomach discomfort.  ? ? PT Education - 08/05/21 0940   ? ? Education Details form/technique with exercise.   ? Person(s) Educated Patient   ? Methods Explanation;Demonstration;Tactile cues;Verbal cues   ? Comprehension Verbalized understanding;Returned demonstration;Verbal cues required;Tactile cues required   ? ?  ?  ? ?  ? ? ? PT Short Term Goals - 04/29/21 1058   ? ?  ? PT SHORT TERM GOAL #1  ?  Title Pt will be independent with HEP in order to improve strength and balance in order to decrease fall risk and improve function at home and work.   ? Baseline 02/02/2021: Patient reports limited HEP in place currently from [facility] agency 12/14 compliance; 1/20/2023Patient states no questions regarding his current exercise regimen and states he is compliant.   ? Time 6   ? Period Weeks   ? Status Achieved   ? Target Date 03/16/21   ? ?  ?  ? ?  ? ? ? ? PT Long Term Goals - 07/20/21 1526   ? ?  ? PT LONG TERM GOAL #1  ? Title Pt will improve FOTO to target score of 50 to display perceived improvements in ability to complete ADL's.   ? Baseline 02/02/2021= 43 12/143: 51%; 04/29/2021= 53%   ? Time 12   ? Period Weeks   ? Status Achieved   ?  ? PT LONG TERM GOAL #2  ? Title Pt will decrease 5TSTS by at least 8 seconds in order to demonstrate clinically significant improvement in LE strength.   ? Baseline 02/02/2021= 28.22 sec with BUE Support 12/14: 23.1 seconds; 04/29/2021=Unable to test today due to patient became nauseous with 6 min walk test and attempted TUG- unable to continue- will reassess next 1-2 visits; 06/08/2021= 25.1 sec with min BUE support; 07/20/2021= 17.75 with min BUE support   ? Time 12   ? Period Weeks   ? Status Achieved   ? Target Date 07/22/21   ?  ? PT LONG TERM GOAL #3  ? Title Pt will decrease TUG to below 19 seconds/decrease in order to demonstrate decreased fall risk.   ? Baseline 02/02/2021= 27.30 sec without AD 12/14: 23 seconds; 04/29/2021- Unable to assess as patient became very nauseated upon standing and request to stop treatment. 06/08/2021= 19.10 sec without an AD; 07/20/2021= 15.68 sec without UE Support   ? Time 12   ? Period Weeks   ? Status Achieved   ? Target Date 07/22/21   ?  ? PT LONG TERM GOAL #4  ? Title Pt will increase 6MWT by at least 24m(162f in order to demonstrate clinically significant improvement in cardiopulmonary endurance and community ambulation   ? Baseline 02/02/2021=  83 feet in 1 min 10 sec wihtout an AD 12/14: 200 ft in  65m14mtes; 04/29/2021=Patient ambulated approx 60 feet yet had to stop due onset of nausea and unable to complete today; 07/15/2021= 330 feet in 2 min 25 sec without AD   ? Time 12   ? Period Weeks   ? Status Partially Met   ? Target Date 07/22/21   ?  ?  PT LONG TERM GOAL #5  ? Title Pt will increase 10MWT by at least 0.2 m/s in order to demonstrate clinically significant improvement in community ambulation.   ? Baseline 02/02/2021= 0.42 m/s 12/14: 0.49 m/s; 04/29/2021=Patient unable to test today secondary to being nauseated. 06/08/2021= 0.62 m/s; 07/20/2021= 0.87 m/s   ? Time 12   ? Period Weeks   ? Status Achieved   ? Target Date 07/22/21   ?  ? Additional Long Term Goals  ? Additional Long Term Goals Yes   ?  ? PT LONG TERM GOAL #6  ? Title Pt will decrease 5TSTS by at least 3 seconds in order to demonstrate clinically significant improvement in LE strength.   ? Baseline 07/20/2021= 17.75 sec with Minimal BUE Support   ? Time 12   ? Period Weeks   ? Status New   ? Target Date 10/12/21   ? ?  ?  ? ?  ? ? ? ? ? ? ? ? Plan - 08/05/21 1128   ? ? Clinical Impression Statement Initially continuing superset exercise to improve pt endurance and capacity for functional activity as pt reporting responding positively to it. Pt tolerating additional resistance and increased standing LE exercise with supersets. However pt limited to shorter session requesting to be done early due to increased stomach discomfort. Pt will continue to benefit from skilled PT services to further improve strength, mobility and QoL.   ? Personal Factors and Comorbidities Comorbidity 3+   ? Comorbidities DM, Liver transplant, ESRD, CAD   ? Examination-Activity Limitations Carry;Lift;Reach Overhead;Squat;Stairs;Stand;Transfers;Toileting   ? Examination-Participation Restrictions Cleaning;Community Activity;Driving;Medication Management;Meal Prep;[place] [employer] Work   ? Stability/Clinical Decision Making  Evolving/Moderate complexity   ? Rehab Potential Fair   ? PT Frequency 2x / week   ? PT Duration 12 weeks   ? PT Treatment/Interventions ADLs/Self Care Home Management;Cryotherapy;Moist Heat;DME Instruction;

## 2021-08-07 ENCOUNTER — Other Ambulatory Visit: Payer: Self-pay

## 2021-08-07 ENCOUNTER — Encounter: Payer: Self-pay | Admitting: Emergency Medicine

## 2021-08-07 ENCOUNTER — Emergency Department: Payer: Medicare Other

## 2021-08-07 ENCOUNTER — Emergency Department
Admission: EM | Admit: 2021-08-07 | Discharge: 2021-08-07 | Disposition: A | Discharge status: Home / Self Care | Payer: Medicare Other | Attending: Emergency Medicine | Admitting: Emergency Medicine

## 2021-08-07 DIAGNOSIS — I120 Hypertensive chronic kidney disease with stage 5 chronic kidney disease or end stage renal disease: Secondary | ICD-10-CM | POA: Diagnosis not present

## 2021-08-07 DIAGNOSIS — R0789 Other chest pain: Secondary | ICD-10-CM | POA: Diagnosis not present

## 2021-08-07 DIAGNOSIS — E876 Hypokalemia: Secondary | ICD-10-CM | POA: Insufficient documentation

## 2021-08-07 DIAGNOSIS — R0602 Shortness of breath: Secondary | ICD-10-CM | POA: Diagnosis present

## 2021-08-07 DIAGNOSIS — R079 Chest pain, unspecified: Secondary | ICD-10-CM

## 2021-08-07 DIAGNOSIS — N186 End stage renal disease: Secondary | ICD-10-CM | POA: Diagnosis not present

## 2021-08-07 LAB — CBC
HCT: 26.9 % — ABNORMAL LOW (ref 39.0–52.0)
Hemoglobin: 9.1 g/dL — ABNORMAL LOW (ref 13.0–17.0)
MCH: 31.2 pg (ref 26.0–34.0)
MCHC: 33.8 g/dL (ref 30.0–36.0)
MCV: 92.1 fL (ref 80.0–100.0)
Platelets: 187 10*3/uL (ref 150–400)
RBC: 2.92 MIL/uL — ABNORMAL LOW (ref 4.22–5.81)
RDW: 14.5 % (ref 11.5–15.5)
WBC: 6.3 10*3/uL (ref 4.0–10.5)
nRBC: 0 % (ref 0.0–0.2)

## 2021-08-07 LAB — COMPREHENSIVE METABOLIC PANEL
ALT: 20 U/L (ref 0–44)
AST: 25 U/L (ref 15–41)
Albumin: 3.7 g/dL (ref 3.5–5.0)
Alkaline Phosphatase: 118 U/L (ref 38–126)
Anion gap: 13 (ref 5–15)
BUN: 34 mg/dL — ABNORMAL HIGH (ref 8–23)
CO2: 28 mmol/L (ref 22–32)
Calcium: 9.4 mg/dL (ref 8.9–10.3)
Chloride: 92 mmol/L — ABNORMAL LOW (ref 98–111)
Creatinine, Ser: 2.61 mg/dL — ABNORMAL HIGH (ref 0.61–1.24)
GFR, Estimated: 26 mL/min — ABNORMAL LOW (ref >60)
Glucose, Bld: 170 mg/dL — ABNORMAL HIGH (ref 70–99)
Potassium: 3.2 mmol/L — ABNORMAL LOW (ref 3.5–5.1)
Sodium: 133 mmol/L — ABNORMAL LOW (ref 135–145)
Total Bilirubin: 0.6 mg/dL (ref 0.3–1.2)
Total Protein: 6.7 g/dL (ref 6.5–8.1)

## 2021-08-07 LAB — BRAIN NATRIURETIC PEPTIDE: B Natriuretic Peptide: 50.8 pg/mL (ref 0.0–100.0)

## 2021-08-07 LAB — TROPONIN I (HIGH SENSITIVITY)
Troponin I (High Sensitivity): 20 ng/L — ABNORMAL HIGH (ref <18)
Troponin I (High Sensitivity): 19 ng/L — ABNORMAL HIGH (ref <18)

## 2021-08-07 NOTE — ED Notes (Signed)
Dc ppw provided to patient. Pt denies any questions and provides verbal consent for dc. Pt assisted to front entrance with son and walker alert and oriented ?

## 2021-08-07 NOTE — ED Triage Notes (Signed)
Pt reports chest pain that is pressure like in nature this am. Pt reports intermittent episodes of SOB with the pain. Pt reports pain does not raidate ?

## 2021-08-07 NOTE — ED Provider Notes (Signed)
? ?[facility] ?Provider Note ? ? ? Event Date/Time  ? First MD Initiated Contact with Patient 08/07/21 1235   ?  (approximate) ? ?History  ? ?Chief Complaint: Chest Pain and Shortness of Breath ? ?HPI ? ?Alex Hampton is a 66 y.o. male with a past medical history of hypertension, hyperlipidemia, liver transplant in 2021, ESRD on HD, MI x2 with 1 stent presents to the emergency department for mild shortness of breath and chest pain.  According to the patient since this morning he has been experiencing a pressure sensation in the center of his chest with some slight shortness of breath at times.  Denies any shortness of breath currently.  Denies any nausea or diaphoresis.  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  Follows up with Dr. Saralyn Pilar of cardiology.  Patient is also end-stage renal on hemodialysis, last dialysis yesterday, still produces urine each day per patient. ? ?Physical Exam  ? ?Triage Vital Signs: ?ED Triage Vitals  ?Enc Vitals Group  ?   BP 08/07/21 1228 116/79  ?   Pulse Rate 08/07/21 1228 71  ?   Resp 08/07/21 1228 20  ?   Temp 08/07/21 1228 98.4 ?F (36.9 ?C)  ?   Temp Source 08/07/21 1228 Oral  ?   SpO2 08/07/21 1228 99 %  ?   Weight 08/07/21 1226 184 lb (83.5 kg)  ?   Height 08/07/21 1226 '6\' 1"'$  (1.854 m)  ?   Head Circumference --   ?   Peak Flow --   ?   Pain Score 08/07/21 1226 4  ?   Pain Loc --   ?   Pain Edu? --   ?   Excl. in [place]? --   ? ? ?Most recent vital signs: ?Vitals:  ? 08/07/21 1228  ?BP: 116/79  ?Pulse: 71  ?Resp: 20  ?Temp: 98.4 ?F (36.9 ?C)  ?SpO2: 99%  ? ? ?General: Awake, no distress.  ?CV:  Good peripheral perfusion.  Regular rate and rhythm  ?Resp:  Normal effort.  Equal breath sounds bilaterally.  Right HD subclavian cath present. ?Abd:  No distention.  Soft, nontender.  No rebound or guarding.  PEG tube. ? ? ? ?ED Results / Procedures / Treatments  ? ?EKG ? ?EKG viewed and interpreted by myself shows a normal sinus  rhythm at 72 bpm with a narrow QRS, normal axis, normal intervals, no concerning ST changes. ? ?RADIOLOGY ? ?I personally viewed the chest x-ray images.  Patient has a right-sided subclavian central line but no other acute findings on my evaluation. ?Radiology is read the x-ray is negative. ? ? ?MEDICATIONS ORDERED IN ED: ?Medications - No data to display ? ? ?IMPRESSION / MDM / ASSESSMENT AND PLAN / ED COURSE  ?I reviewed the triage vital signs and the nursing notes. ? ?Patient presents emergency department for chest discomfort/pressure.  Patient has a history of 2 prior MIs with stent placed in 2004.  He also has a history of ESRD on HD as well as a liver transplant 2021.  Currently the patient appears well denies any shortness of breath currently.  States a very mild chest pressure.  Vital signs are reassuring.  Physical exam is reassuring with clear lung sounds.  We will check labs including cardiac enzymes and obtain a chest x-ray.  Differential would include ACS, CHF, fluid overload, pneumonia.  Patient agreeable to the work-up. ? ?Patient's work-up is overall  reassuring.  CBC shows no significant findings, troponin slightly elevated at 20 however repeat troponin is 19 after several hours.  Chemistry shows no concerning abnormality potassium currently 3.2.  EKG is reassuring, chest x-ray reassuring.  Given the patient's reassuring work-up and negative troponin x2 I believe the patient will be safe for discharge home with cardiology follow-up with Dr. Saralyn Pilar.  Patient agreeable to plan of care. ? ?FINAL CLINICAL IMPRESSION(S) / ED DIAGNOSES  ? ?Chest pain ? ?Rx / DC Orders  ? ?Cardiology follow-up ? ?Note:  This document was prepared using Dragon voice recognition software and may include unintentional dictation errors. ?  ?Harvest Dark, MD ?08/07/21 1452 ? ?

## 2021-08-08 ENCOUNTER — Observation Stay
Admission: EM | Admit: 2021-08-08 | Discharge: 2021-08-09 | Disposition: A | Discharge status: Home / Self Care | Payer: Medicare Other | Attending: Hospitalist | Admitting: Hospitalist

## 2021-08-08 ENCOUNTER — Emergency Department: Payer: Medicare Other

## 2021-08-08 ENCOUNTER — Other Ambulatory Visit: Payer: Self-pay

## 2021-08-08 ENCOUNTER — Encounter: Payer: Self-pay | Admitting: Emergency Medicine

## 2021-08-08 DIAGNOSIS — Z992 Dependence on renal dialysis: Secondary | ICD-10-CM | POA: Diagnosis not present

## 2021-08-08 DIAGNOSIS — E1122 Type 2 diabetes mellitus with diabetic chronic kidney disease: Secondary | ICD-10-CM | POA: Diagnosis not present

## 2021-08-08 DIAGNOSIS — I2699 Other pulmonary embolism without acute cor pulmonale: Secondary | ICD-10-CM | POA: Diagnosis present

## 2021-08-08 DIAGNOSIS — R1032 Left lower quadrant pain: Secondary | ICD-10-CM | POA: Diagnosis not present

## 2021-08-08 DIAGNOSIS — Z7902 Long term (current) use of antithrombotics/antiplatelets: Secondary | ICD-10-CM | POA: Diagnosis not present

## 2021-08-08 DIAGNOSIS — Z794 Long term (current) use of insulin: Secondary | ICD-10-CM | POA: Insufficient documentation

## 2021-08-08 DIAGNOSIS — I252 Old myocardial infarction: Secondary | ICD-10-CM | POA: Insufficient documentation

## 2021-08-08 DIAGNOSIS — F419 Anxiety disorder, unspecified: Secondary | ICD-10-CM | POA: Diagnosis present

## 2021-08-08 DIAGNOSIS — E46 Unspecified protein-calorie malnutrition: Secondary | ICD-10-CM | POA: Diagnosis present

## 2021-08-08 DIAGNOSIS — E876 Hypokalemia: Secondary | ICD-10-CM | POA: Diagnosis not present

## 2021-08-08 DIAGNOSIS — I120 Hypertensive chronic kidney disease with stage 5 chronic kidney disease or end stage renal disease: Secondary | ICD-10-CM | POA: Diagnosis not present

## 2021-08-08 DIAGNOSIS — E119 Type 2 diabetes mellitus without complications: Secondary | ICD-10-CM

## 2021-08-08 DIAGNOSIS — J189 Pneumonia, unspecified organism: Secondary | ICD-10-CM | POA: Insufficient documentation

## 2021-08-08 DIAGNOSIS — Z944 Liver transplant status: Secondary | ICD-10-CM

## 2021-08-08 DIAGNOSIS — I2693 Single subsegmental pulmonary embolism without acute cor pulmonale: Principal | ICD-10-CM | POA: Insufficient documentation

## 2021-08-08 DIAGNOSIS — Z7982 Long term (current) use of aspirin: Secondary | ICD-10-CM | POA: Insufficient documentation

## 2021-08-08 DIAGNOSIS — R531 Weakness: Secondary | ICD-10-CM | POA: Diagnosis present

## 2021-08-08 DIAGNOSIS — I2510 Atherosclerotic heart disease of native coronary artery without angina pectoris: Secondary | ICD-10-CM | POA: Insufficient documentation

## 2021-08-08 DIAGNOSIS — Z87891 Personal history of nicotine dependence: Secondary | ICD-10-CM | POA: Diagnosis not present

## 2021-08-08 DIAGNOSIS — K219 Gastro-esophageal reflux disease without esophagitis: Secondary | ICD-10-CM | POA: Diagnosis present

## 2021-08-08 DIAGNOSIS — N186 End stage renal disease: Secondary | ICD-10-CM | POA: Diagnosis not present

## 2021-08-08 LAB — CBG MONITORING, ED: Glucose-Capillary: 141 mg/dL — ABNORMAL HIGH (ref 70–99)

## 2021-08-08 LAB — URINALYSIS, ROUTINE W REFLEX MICROSCOPIC
Bilirubin Urine: NEGATIVE
Glucose, UA: NEGATIVE mg/dL
Ketones, ur: NEGATIVE mg/dL
Nitrite: NEGATIVE
Protein, ur: NEGATIVE mg/dL
Specific Gravity, Urine: 1.023 (ref 1.005–1.030)
Squamous Epithelial / HPF: NONE SEEN (ref 0–5)
pH: 5 (ref 5.0–8.0)

## 2021-08-08 LAB — CBC
HCT: 32 % — ABNORMAL LOW (ref 39.0–52.0)
Hemoglobin: 10.5 g/dL — ABNORMAL LOW (ref 13.0–17.0)
MCH: 30.9 pg (ref 26.0–34.0)
MCHC: 32.8 g/dL (ref 30.0–36.0)
MCV: 94.1 fL (ref 80.0–100.0)
Platelets: 190 10*3/uL (ref 150–400)
RBC: 3.4 MIL/uL — ABNORMAL LOW (ref 4.22–5.81)
RDW: 14.5 % (ref 11.5–15.5)
WBC: 7.6 10*3/uL (ref 4.0–10.5)
nRBC: 0 % (ref 0.0–0.2)

## 2021-08-08 LAB — BASIC METABOLIC PANEL WITH GFR
Anion gap: 11 (ref 5–15)
BUN: 47 mg/dL — ABNORMAL HIGH (ref 8–23)
CO2: 28 mmol/L (ref 22–32)
Calcium: 9.7 mg/dL (ref 8.9–10.3)
Chloride: 93 mmol/L — ABNORMAL LOW (ref 98–111)
Creatinine, Ser: 3.12 mg/dL — ABNORMAL HIGH (ref 0.61–1.24)
GFR, Estimated: 21 mL/min — ABNORMAL LOW
Glucose, Bld: 211 mg/dL — ABNORMAL HIGH (ref 70–99)
Potassium: 3.3 mmol/L — ABNORMAL LOW (ref 3.5–5.1)
Sodium: 132 mmol/L — ABNORMAL LOW (ref 135–145)

## 2021-08-08 LAB — GLUCOSE, CAPILLARY
Glucose-Capillary: 142 mg/dL — ABNORMAL HIGH (ref 70–99)
Glucose-Capillary: 151 mg/dL — ABNORMAL HIGH (ref 70–99)

## 2021-08-08 LAB — HEMOGLOBIN A1C
Hgb A1c MFr Bld: 6.3 % — ABNORMAL HIGH (ref 4.8–5.6)
Mean Plasma Glucose: 134.11 mg/dL

## 2021-08-08 MED ORDER — METOPROLOL TARTRATE 25 MG PO TABS
25.0000 mg | ORAL_TABLET | Freq: Two times a day (BID) | ORAL | Status: DC
  Administered 2021-08-08: 25 mg via ORAL
  Filled 2021-08-08: qty 1

## 2021-08-08 MED ORDER — SODIUM CHLORIDE 0.9 % IV SOLN
250.0000 mL | INTRAVENOUS | Status: DC | PRN
Start: 2021-08-08 — End: 2021-08-10

## 2021-08-08 MED ORDER — SUVOREXANT 10 MG PO TABS: 1.0000 | ORAL_TABLET | Freq: Every day | ORAL | Status: DC

## 2021-08-08 MED ORDER — ESCITALOPRAM OXALATE 10 MG PO TABS
10.0000 mg | ORAL_TABLET | Freq: Every day | ORAL | Status: DC
  Administered 2021-08-09: 10 mg via ORAL
  Filled 2021-08-08: qty 1

## 2021-08-08 MED ORDER — MYCOPHENOLATE SODIUM 180 MG PO TBEC
360.0000 mg | DELAYED_RELEASE_TABLET | Freq: Two times a day (BID) | ORAL | Status: DC
  Administered 2021-08-08 – 2021-08-09 (×2): 360 mg via ORAL
  Filled 2021-08-08 (×4): qty 2

## 2021-08-08 MED ORDER — ONDANSETRON HCL 4 MG PO TABS
4.0000 mg | ORAL_TABLET | Freq: Four times a day (QID) | ORAL | Status: DC | PRN
  Administered 2021-08-08: 4 mg via ORAL
  Filled 2021-08-08: qty 1

## 2021-08-08 MED ORDER — GABAPENTIN 100 MG PO CAPS
100.0000 mg | ORAL_CAPSULE | Freq: Two times a day (BID) | ORAL | Status: DC
  Filled 2021-08-08: qty 1

## 2021-08-08 MED ORDER — HEPARIN BOLUS VIA INFUSION
6000.0000 [IU] | Freq: Once | INTRAVENOUS | Status: AC
  Administered 2021-08-08: 6000 [IU] via INTRAVENOUS
  Filled 2021-08-08: qty 6000

## 2021-08-08 MED ORDER — MIRTAZAPINE 15 MG PO TABS
15.0000 mg | ORAL_TABLET | Freq: Every day | ORAL | Status: DC
  Administered 2021-08-08: 15 mg via ORAL
  Filled 2021-08-08: qty 1

## 2021-08-08 MED ORDER — INSULIN ASPART 100 UNIT/ML IJ SOLN
0.0000 [IU] | INTRAMUSCULAR | Status: DC
  Administered 2021-08-08 – 2021-08-09 (×3): 1 [IU] via SUBCUTANEOUS
  Filled 2021-08-08 (×3): qty 1

## 2021-08-08 MED ORDER — BUSPIRONE HCL 5 MG PO TABS
2.5000 mg | ORAL_TABLET | Freq: Two times a day (BID) | ORAL | Status: DC
  Administered 2021-08-09: 2.5 mg via ORAL
  Filled 2021-08-08 (×3): qty 0.5

## 2021-08-08 MED ORDER — PANTOPRAZOLE SODIUM 40 MG PO TBEC: 40.0000 mg | DELAYED_RELEASE_TABLET | Freq: Two times a day (BID) | ORAL | Status: DC

## 2021-08-08 MED ORDER — POTASSIUM CHLORIDE CRYS ER 20 MEQ PO TBCR
20.0000 meq | EXTENDED_RELEASE_TABLET | Freq: Once | ORAL | Status: AC
  Administered 2021-08-08: 20 meq via ORAL
  Filled 2021-08-08: qty 1

## 2021-08-08 MED ORDER — ACETAMINOPHEN 325 MG PO TABS
650.0000 mg | ORAL_TABLET | Freq: Three times a day (TID) | ORAL | Status: DC | PRN
  Administered 2021-08-08: 650 mg via ORAL
  Filled 2021-08-08: qty 2

## 2021-08-08 MED ORDER — FERROUS SULFATE 325 (65 FE) MG PO TABS
325.0000 mg | ORAL_TABLET | Freq: Every morning | ORAL | Status: DC
Start: 2021-08-09 — End: 2021-08-10
  Administered 2021-08-09: 325 mg via ORAL
  Filled 2021-08-08: qty 1

## 2021-08-08 MED ORDER — IOHEXOL 350 MG/ML SOLN
75.0000 mL | Freq: Once | INTRAVENOUS | Status: AC | PRN
  Administered 2021-08-08: 75 mL via INTRAVENOUS

## 2021-08-08 MED ORDER — ASPIRIN EC 81 MG PO TBEC
81.0000 mg | DELAYED_RELEASE_TABLET | Freq: Every day | ORAL | Status: DC
  Administered 2021-08-09: 81 mg via ORAL
  Filled 2021-08-08: qty 1

## 2021-08-08 MED ORDER — SODIUM CHLORIDE 0.9% FLUSH: 3.0000 mL | Freq: Two times a day (BID) | INTRAVENOUS | Status: DC

## 2021-08-08 MED ORDER — MELATONIN 5 MG PO TABS
10.0000 mg | ORAL_TABLET | Freq: Every day | ORAL | Status: DC
  Administered 2021-08-08: 10 mg via ORAL
  Filled 2021-08-08: qty 2

## 2021-08-08 MED ORDER — SODIUM CHLORIDE 0.9 % IV SOLN
500.0000 mg | Freq: Once | INTRAVENOUS | Status: DC
Start: 2021-08-08 — End: 2021-08-08
  Filled 2021-08-08: qty 5

## 2021-08-08 MED ORDER — NITROGLYCERIN 0.4 MG SL SUBL: 0.4000 mg | SUBLINGUAL_TABLET | SUBLINGUAL | Status: DC | PRN

## 2021-08-08 MED ORDER — LEMBOREXANT 5 MG PO TABS: 1.0000 | ORAL_TABLET | Freq: Every day | ORAL | Status: DC

## 2021-08-08 MED ORDER — SODIUM CHLORIDE 0.9% FLUSH: 3.0000 mL | INTRAVENOUS | Status: DC | PRN

## 2021-08-08 MED ORDER — HEPARIN (PORCINE) 25000 UT/250ML-% IV SOLN
1300.0000 [IU]/h | INTRAVENOUS | Status: DC
  Administered 2021-08-08: 1400 [IU]/h via INTRAVENOUS
  Administered 2021-08-09: 1300 [IU]/h via INTRAVENOUS
  Filled 2021-08-08 (×2): qty 250

## 2021-08-08 MED ORDER — ONDANSETRON HCL 4 MG/2ML IJ SOLN: 4.0000 mg | Freq: Four times a day (QID) | INTRAMUSCULAR | Status: DC | PRN

## 2021-08-08 MED ORDER — SODIUM CHLORIDE 0.9 % IV SOLN
500.0000 mg | INTRAVENOUS | Status: DC
  Administered 2021-08-08: 500 mg via INTRAVENOUS
  Filled 2021-08-08: qty 5

## 2021-08-08 MED ORDER — NEPRO/CARBSTEADY PO LIQD
600.0000 mL | Freq: Every day | ORAL | Status: DC
  Administered 2021-08-08: 600 mL

## 2021-08-08 MED ORDER — NEPRO/CARBSTEADY PO LIQD: 75.0000 mL | Freq: Every day | ORAL | Status: DC

## 2021-08-08 MED ORDER — CLOPIDOGREL BISULFATE 75 MG PO TABS
75.0000 mg | ORAL_TABLET | Freq: Every day | ORAL | Status: DC
  Administered 2021-08-09: 75 mg via ORAL
  Filled 2021-08-08: qty 1

## 2021-08-08 MED ORDER — LIDOCAINE 5 % EX PTCH
1.0000 | MEDICATED_PATCH | CUTANEOUS | Status: DC
  Administered 2021-08-08: 1 via TRANSDERMAL
  Filled 2021-08-08 (×2): qty 1

## 2021-08-08 MED ORDER — SODIUM CHLORIDE 0.9 % IV SOLN: 500.0000 mg | INTRAVENOUS | Status: DC

## 2021-08-08 MED ORDER — METOCLOPRAMIDE HCL 10 MG PO TABS
5.0000 mg | ORAL_TABLET | Freq: Three times a day (TID) | ORAL | Status: DC
  Administered 2021-08-08 – 2021-08-09 (×2): 5 mg via ORAL
  Filled 2021-08-08 (×2): qty 1

## 2021-08-08 MED ORDER — TACROLIMUS 1 MG PO CAPS
1.0000 mg | ORAL_CAPSULE | Freq: Two times a day (BID) | ORAL | Status: DC
  Administered 2021-08-08 – 2021-08-09 (×2): 1 mg via ORAL
  Filled 2021-08-08 (×4): qty 1

## 2021-08-08 MED ORDER — SODIUM CHLORIDE 0.9 % IV SOLN
1.0000 g | Freq: Once | INTRAVENOUS | Status: AC
  Administered 2021-08-08: 1 g via INTRAVENOUS
  Filled 2021-08-08: qty 10

## 2021-08-08 MED ORDER — ATORVASTATIN CALCIUM 20 MG PO TABS
40.0000 mg | ORAL_TABLET | Freq: Every day | ORAL | Status: DC
  Administered 2021-08-08: 40 mg via ORAL
  Filled 2021-08-08: qty 2

## 2021-08-08 MED ORDER — FAMOTIDINE 20 MG PO TABS: 40.0000 mg | ORAL_TABLET | Freq: Every day | ORAL | Status: DC

## 2021-08-08 MED ORDER — DOCUSATE SODIUM 50 MG/5ML PO LIQD
100.0000 mg | Freq: Two times a day (BID) | ORAL | Status: DC
  Filled 2021-08-08 (×2): qty 10

## 2021-08-08 MED ORDER — TRAZODONE HCL 50 MG PO TABS
50.0000 mg | ORAL_TABLET | Freq: Every day | ORAL | Status: DC
  Administered 2021-08-08: 50 mg via ORAL
  Filled 2021-08-08: qty 1

## 2021-08-08 MED ORDER — MIDODRINE HCL 5 MG PO TABS
10.0000 mg | ORAL_TABLET | ORAL | Status: DC
  Administered 2021-08-09: 10 mg via ORAL

## 2021-08-08 MED ORDER — SODIUM CHLORIDE 0.9 % IV SOLN: 2.0000 g | INTRAVENOUS | Status: DC

## 2021-08-08 MED ORDER — SODIUM CHLORIDE 0.9 % IV SOLN
2.0000 g | INTRAVENOUS | Status: DC
  Filled 2021-08-08: qty 20

## 2021-08-08 NOTE — Assessment & Plan Note (Signed)
Patient has a history of protein calorie malnutrition and has a PEG tube in place ?Continue nocturnal tube feeds from 10 PM to 8 AM ?

## 2021-08-08 NOTE — Assessment & Plan Note (Signed)
Patient presents for evaluation of chest pain and shortness of breath.  He has a cough which is occasionally productive of clear phlegm but denies having any fever or chills. ?Imaging shows minimal patchy consolidation and nodularity in the central right middle lobe, likely infectious/inflammatory in etiology. ??  For possible pulmonary infarct ?We will treat patient empirically with Rocephin and Zithromax ?

## 2021-08-08 NOTE — Assessment & Plan Note (Signed)
Treated and resolved 

## 2021-08-08 NOTE — Consult Note (Signed)
ANTICOAGULATION CONSULT NOTE - Initial Consult ? ?Pharmacy Consult for heparin infusion ?Indication: pulmonary embolus ? ?No Known Allergies ? ?Patient Measurements: ?Height: '6\' 1"'$  (185.4 cm) ?Weight: 83.5 kg (184 lb) ?IBW/kg (Calculated) : 79.9 ?Heparin Dosing Weight: 83.5 kg  ? ?Vital Signs: ?Temp: 98.7 ?F (37.1 ?C) (05/01 7026) ?Temp Source: Oral (05/01 3785) ?BP: 127/74 (05/01 1530) ?Pulse Rate: 68 (05/01 1530) ? ?Labs: ?Recent Labs  ?  08/07/21 ?1239 08/07/21 ?1248 08/08/21 ?0956  ?HGB 9.1*  --  10.5*  ?HCT 26.9*  --  32.0*  ?PLT 187  --  190  ?CREATININE 2.61*  --  3.12*  ?TROPONINIHS 20* 19*  --   ? ? ?Estimated Creatinine Clearance: 26 mL/min (A) (by C-G formula based on SCr of 3.12 mg/dL (H)). ? ? ?Medical History: ?Past Medical History:  ?Diagnosis Date  ? Depression   ? History of cardiac cath   ? History of heart artery stent   ? Hyperlipidemia   ? Hypertension   ? MI (myocardial infarction) ([place])   ? Renal disorder   ? ? ?Medications:  ?No prior anticoagulation noted  ? ?Assessment: ?66 y.o. male with a history of a liver transplant in 2021, end-stage renal disease who presents with complaints of chest discomfort as well as left lower back pain. Found to have PE on imaging. Pharmacy consulted to initiate and manage heparin infusion ? ?Goal of Therapy:  ?Heparin level 0.3-0.7 units/ml ?Monitor platelets by anticoagulation protocol: Yes ?  ?Plan:  ?Give 6000 units bolus x 1 ?Start heparin infusion at 1400 units/hr ?Check anti-Xa level in 8 hours ?Continue to monitor H&H and platelets ? ?Dorothe Pea, PharmD, BCPS ?Clinical Pharmacist   ?08/08/2021,3:46 PM ? ? ?

## 2021-08-08 NOTE — Assessment & Plan Note (Signed)
Stable.  Continue Pepcid. 

## 2021-08-08 NOTE — ED Notes (Signed)
Patient given pillow and door shut ? ?

## 2021-08-08 NOTE — ED Notes (Signed)
Patient transported to CT 

## 2021-08-08 NOTE — ED Provider Notes (Signed)
? ?[facility] ?Provider Note ? ? ? Event Date/Time  ? First MD Initiated Contact with Patient 08/08/21 1037   ?  (approximate) ? ? ?History  ? ?Back Pain and Weakness ? ? ?HPI ? ?Alex Hampton is a 66 y.o. male with a history of a liver transplant in 2021, end-stage renal disease who presents with complaints of chest discomfort as well as left lower back pain.  Patient reports chest discomfort is been going on for at least 2 days now.  He has had a mild cough, denies fevers.  He is immunosuppressed is compliant with his medications.  No diaphoresis.  No dysuria or hematuria. ?  ? ? ?Physical Exam  ? ?Triage Vital Signs: ?ED Triage Vitals  ?Enc Vitals Group  ?   BP 08/08/21 0954 115/75  ?   Pulse Rate 08/08/21 0954 79  ?   Resp 08/08/21 0954 16  ?   Temp 08/08/21 0954 98.7 ?F (37.1 ?C)  ?   Temp Source 08/08/21 0954 Oral  ?   SpO2 08/08/21 0954 98 %  ?   Weight 08/08/21 0955 83.5 kg (184 lb)  ?   Height 08/08/21 0955 1.854 m ('6\' 1"'$ )  ?   Head Circumference --   ?   Peak Flow --   ?   Pain Score 08/08/21 1007 4  ?   Pain Loc --   ?   Pain Edu? --   ?   Excl. in [place]? --   ? ? ?Most recent vital signs: ?Vitals:  ? 08/08/21 1230 08/08/21 1300  ?BP: 130/76 125/76  ?Pulse: 65 66  ?Resp:  16  ?Temp:    ?SpO2: 100% 100%  ? ? ? ?General: Awake, no distress.  ?CV:  Good peripheral perfusion.  Regular rate and rhythm ?Resp:  Normal effort.  CTA bilaterally ?Abd:  No distention.  Soft, nontender, no flank pain, no rash ?Other:   ? ? ?ED Results / Procedures / Treatments  ? ?Labs ?(all labs ordered are listed, but only abnormal results are displayed) ?Labs Reviewed  ?BASIC METABOLIC PANEL - Abnormal; Notable for the following components:  ?    Result Value  ? Sodium 132 (*)   ? Potassium 3.3 (*)   ? Chloride 93 (*)   ? Glucose, Bld 211 (*)   ? BUN 47 (*)   ? Creatinine, Ser 3.12 (*)   ? GFR, Estimated 21 (*)   ? All other components within normal limits  ?CBC - Abnormal; Notable for the following components:   ? RBC 3.40 (*)   ? Hemoglobin 10.5 (*)   ? HCT 32.0 (*)   ? All other components within normal limits  ?URINALYSIS, ROUTINE W REFLEX MICROSCOPIC - Abnormal; Notable for the following components:  ? Color, Urine YELLOW (*)   ? APPearance HAZY (*)   ? Hgb urine dipstick SMALL (*)   ? Leukocytes,Ua TRACE (*)   ? Bacteria, UA RARE (*)   ? All other components within normal limits  ?CULTURE, BLOOD (SINGLE)  ?CBG MONITORING, ED  ? ? ? ?EKG ? ? ? ? ?RADIOLOGY ?CT chest reviewed by me, concerning for possible infiltrate, radiology notes small submental PE as well ? ? ? ?PROCEDURES: ? ?Critical Care performed:  ? ?Procedures ? ? ?MEDICATIONS ORDERED IN ED: ?Medications  ?cefTRIAXone (ROCEPHIN) 1 g in sodium chloride 0.9 % 100 mL IVPB (has no administration in time range)  ?azithromycin (ZITHROMAX) 500 mg in sodium chloride 0.9 %  250 mL IVPB (has no administration in time range)  ?iohexol (OMNIPAQUE) 350 MG/ML injection 75 mL (75 mLs Intravenous Contrast Given 08/08/21 1318)  ? ? ? ?IMPRESSION / MDM / ASSESSMENT AND PLAN / ED COURSE  ?I reviewed the triage vital signs and the nursing notes. ? ? ?Patient with a history of liver transplant, end-stage renal disease, immunosuppressed who presents with continued chest discomfort as well as left low back pain.  Possibility of musculoskeletal pain in the back, UTI, ureterolithiasis although the location is somewhat low.  He describes central chest discomfort as well, cough. ? ?Given continued symptoms will send for CT chest and CT abdomen pelvis.  Lab work today is reassuring ? ?CT chest demonstrates likely pneumonia as well as small subsegmental PE ? ?CT abdomen pelvis is overall reassuring ? ?Given his immunosuppressed status will admit for IV antibiotics ? ? ? ? ?  ? ? ?FINAL CLINICAL IMPRESSION(S) / ED DIAGNOSES  ? ?Final diagnoses:  ?Community acquired pneumonia of right middle lobe of lung  ?Single subsegmental pulmonary embolism without acute cor pulmonale (HCC)  ? ? ? ?Rx /  DC Orders  ? ?ED Discharge Orders   ? ? None  ? ?  ? ? ? ?Note:  This document was prepared using Dragon voice recognition software and may include unintentional dictation errors. ?  ?Lavonia Drafts, MD ?08/08/21 1510 ? ?

## 2021-08-08 NOTE — Assessment & Plan Note (Signed)
Maintain consistent carbohydrate diet Glycemic control with sliding scale insulin 

## 2021-08-08 NOTE — H&P (Addendum)
?History and Physical  ? ? ?Patient: Alex Hampton ZJQ:[id number] DOB: [birth year] ?DOA: 08/08/2021 ?DOS: the patient was seen and examined on 08/08/2021 ?PCP: Juluis Pitch, MD  ?Patient coming from: Home ? ?Chief Complaint:  ?Chief Complaint  ?Patient presents with  ? Back Pain  ? Weakness  ? ?HPI: Alex Hampton is a 66 y.o. 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 ?Patient states that he developed chest pain over the anterior chest wall while at rest associated with shortness of breath.  He denied having any nausea, no palpitations or diaphoresis.  He was seen in the ER and ruled out for an acute coronary syndrome and discharged home since his troponin was flat. ?He presents 24 hours later with complaints of persistent chest pain over the left anterior chest wall with radiation to his lower back.  Chest pain is nonpleuritic.  He complains of feeling weak but denies having any nausea, no vomiting, no diaphoresis, no urinary frequency, no hematuria, no nocturia, no fever, no chills, no changes in his bowel habits, no blurred vision or any focal deficits. ?He has a cough which is occasionally productive of clear phlegm and he has had that for about a month.  States the cough is not worse but has not resolved either. ?He has no lower extremity swelling, denies feeling dizzy or lightheaded. ? ?Review of Systems: As mentioned in the history of present illness. All other systems reviewed and are negative. ?Past Medical History:  ?Diagnosis Date  ? Depression   ? History of cardiac cath   ? History of heart artery stent   ?  Hyperlipidemia   ? Hypertension   ? MI (myocardial infarction) ([place])   ? Renal disorder   ? ?Past Surgical History:  ?Procedure Laterality Date  ? IR GJ TUBE CHANGE  11/22/2020  ? IR GJ TUBE CHANGE  11/26/2020  ? IR REPLC GASTRO/COLONIC TUBE PERCUT W/FLUORO  10/14/2020  ? LEFT HEART CATH AND CORONARY ANGIOGRAPHY N/A 08/19/2020  ? Procedure: LEFT HEART CATH AND CORONARY ANGIOGRAPHY;  Surgeon: Corey Skains, MD;  Location: [facility];  Service: Cardiovascular;  Laterality: N/A;  ? LIVER TRANSPLANT    ? ?Social History:  reports that he has quit smoking. He has never used smokeless tobacco. He reports current alcohol use. He reports that he does not use drugs. ? ?No Known Allergies ? ?Family History  ?Problem Relation Age of Onset  ? Diabetes Mellitus II Mother   ? Pancreatic cancer Father   ? ? ?Prior to Admission medications   ?Medication Sig Start Date End Date Taking? Authorizing Provider  ?acetaminophen (TYLENOL) 325 MG tablet Take 650 mg by mouth every 8 (eight) hours as needed for mild pain or moderate pain.    [provider]  ?aspirin 81 MG EC tablet Take 81 mg by mouth daily.    [provider]  ?atorvastatin (LIPITOR) 40 MG tablet Take 40 mg by mouth at bedtime.    [provider]  ?BELSOMRA 10 MG TABS Take by mouth. 07/04/21   [provider]  ?busPIRone (BUSPAR) 5 MG tablet Take 2.5 mg by mouth 2 (two) times daily. 07/23/21  [provider]  ?clopidogrel (PLAVIX) 75 MG tablet Take 1 tablet (75 mg total) by mouth daily. 08/20/20   Loletha Grayer, MD  ?DAYVIGO 5 MG TABS Take by mouth. 07/07/21   [provider]  ?docusate (COLACE) 50 MG/5ML liquid Take 100 mg by mouth 2 (two) times daily.    [provider]  ?escitalopram (LEXAPRO) 10 MG tablet Take 10 mg by mouth daily. 08/06/20   [provider]  ?famotidine (PEPCID) 40 MG tablet Take 40 mg by mouth at bedtime. 12/08/20 12/08/21  [provider]  ?ferrous sulfate 325 (65  FE) MG EC tablet Take 325 mg by mouth every morning. 12/28/20   [provider]  ?gabapentin (NEURONTIN) 100 MG capsule Take 100 mg by mouth 2 (two) times daily. 05/11/21   [provider]  ?lidocaine (LIDODERM) 5 % Place 1 patch onto the skin every 12 (twelve) hours. Remove & Discard patch within 12 hours or as directed by MD 11/09/20 11/09/21  Blake Divine, MD  ?melatonin 3 MG TABS tablet Take 9 mg by mouth at bedtime.    [provider]  ?metoCLOPramide (REGLAN) 5 MG tablet Take 5 mg by mouth 3 (three) times daily before meals.    [provider]  ?metoprolol tartrate (LOPRESSOR) 25 MG tablet Take 25 mg by mouth 2 (two) times daily. 12/21/20   [provider]  ?midodrine (PROAMATINE) 10 MG tablet Take 1 tablet (10 mg total) by mouth daily. ?Patient taking differently: Take 10 mg by mouth Every Tuesday,Thursday,and Saturday with dialysis. 11/30/20   Loletha Grayer, MD  ?mirtazapine (REMERON) 15 MG tablet Take 15 mg by mouth at bedtime. 11/10/20   [provider]  ?mycophenolate (CELLCEPT) 250 MG capsule Take 2 capsules (500 mg total) by mouth 2 (two) times daily. ?Patient not taking: Reported on 03/01/2021 11/30/20   Loletha Grayer, MD  ?mycophenolate (MYFORTIC) 360 MG TBEC EC tablet Take 360 mg by mouth every 12 (twelve) hours. 12/08/20 12/08/21  [provider]  ?nitroGLYCERIN (NITROSTAT) 0.4 MG SL tablet Place 1 tablet (0.4 mg total) under the tongue every 5 (five) minutes as needed for chest pain. 08/20/20   Loletha Grayer, MD  ?Helmut Muster R 100 UNIT/ML injection Inject 0-11 Units into the skin See admin instructions. Take 6 units at 6pm plus correction with meal time. Correction scale with meals.  Glucose Range  <200 none, 201 - 250 mg/dL 1 unit, 251 - 300 mg/dL 2 units, 301 - 350 mg/dL 3 units, 351- 400 mg/dl 4 units , >400 mg/dL take 5 units and call your provider    [provider]  ?Nutritional Supplements (NOVASOURCE RENAL) LIQD Give by tube.  Novasource Renal @ 75 ml/hr 8 hours /night (10 pm -8 am)    [provider]  ?ondansetron (ZOFRAN-ODT) 4 MG disintegrating tablet Take 4 mg by mouth 2 (two) times daily as needed for nausea.    [provider]  ?pantoprazole (PROTONIX) 40 MG tablet Take 1 tablet (40 mg total) by mouth daily. ?Patient taking differently: Take 40 mg by mouth every 12 (twelve) hours. 10/14/20   Enzo Bi, MD  ?predniSONE (DELTASONE) 5 MG tablet Take 2.5 mg by mouth daily with breakfast.    [provider]  ?senna-docusate (SENOKOT-S) 8.6-50 MG tablet Take 2 tablets by mouth 2 (two) times daily as needed for mild constipation or moderate constipation. 10/14/20   Enzo Bi, MD  ?tacrolimus (PROGRAF) 0.5 MG capsule Take 0.5 mg by mouth every 12 (twelve) hours. 08/02/21  [provider]  ?tacrolimus (PROGRAF) 1 MG capsule Take 1 mg by mouth every 12 (twelve) hours.    [provider]  ?traZODone (DESYREL) 100 MG tablet Take 50 mg by mouth at bedtime.    [provider]  ? ? ?Physical Exam: ?Vitals:  ? 08/08/21 1400 08/08/21 1430 08/08/21 1500 08/08/21 1530  ?BP: 123/75 128/86 126/81 127/74  ?Pulse: 65 74 66 68  ?Resp:      ?Temp:      ?TempSrc:      ?SpO2: 100% 100% 100% 100%  ?Weight:      ?Height:      ? ?Physical Exam ?Vitals and nursing note reviewed.  ?Constitutional:   ?   Comments: Chronically ill-appearing  ?HENT:  ?   Head: Normocephalic and atraumatic.  ?   Nose: Nose normal.  ?   Mouth/Throat:  ?   Mouth: Mucous membranes are moist.  ?Eyes:  ?   Conjunctiva/sclera: Conjunctivae normal.  ?Cardiovascular:  ?   Rate and Rhythm: Normal rate and regular rhythm.  ?   Comments: PermCath for dialysis ?Pulmonary:  ?   Effort: Pulmonary effort is normal.  ?   Breath sounds: Normal breath sounds.  ?Abdominal:  ?   General: Abdomen is flat. Bowel sounds are normal.  ?   Palpations: Abdomen is soft.  ?   Comments: PEG tube in place  ?Musculoskeletal:     ?   General: Normal range of motion.   ?   Cervical back: Normal range of motion and neck supple.  ?Skin: ?   General: Skin is warm and dry.  ?Neurological:  ?   Mental Status: He is alert and oriented to person, place, and time.  ?   Motor: Wea

## 2021-08-08 NOTE — Assessment & Plan Note (Signed)
Stable ?Continue BuSpar, Lexapro, mirtazapine and trazodone ?

## 2021-08-08 NOTE — Assessment & Plan Note (Signed)
Status post liver transplant for cirrhosis related to [place] ?Continue immunosuppressive therapy with mycophenolate and tacrolimus ?Follow-up with transplant specialist as an outpatient ?

## 2021-08-08 NOTE — Progress Notes (Signed)
IVT consulted for difficult PIV placement.  Upon arrival, male at bedside asked if port can be accessed.  RN made aware pt has port and will access.  Port is a dual. ?

## 2021-08-08 NOTE — Assessment & Plan Note (Addendum)
Status post stent angioplasty ?Patient ruled out for acute coronary syndrome and troponin is flat ?Continue aspirin, Plavix, atorvastatin and metoprolol on nondialysis days ?

## 2021-08-08 NOTE — Assessment & Plan Note (Signed)
Patient has a history of end-stage renal disease on hemodialysis.  Dialysis days are T/TH/S ?Nephrology consult for renal replacement therapy ?

## 2021-08-08 NOTE — Assessment & Plan Note (Signed)
Patient presents for evaluation of chest pain and shortness of breath. ?He had a CT angiogram of the chest which showed single subsegmental pulmonary embolus in the right lower lobe ?Discussed with vascular surgery who agrees with anticoagulation for now unless there is a contraindication. ?We will start patient on heparin drip ?Obtain 2D echocardiogram to assess LVEF ?

## 2021-08-08 NOTE — ED Triage Notes (Signed)
Pt via POV from c/o  L lower back pain. Pt denies any urinary symptoms. Pt is A&OX4 and NAD. Pt has a hx of a kidney stone. Pt also endorsing weakness. Pt is a liver transplant pt and dialysis pt. Pt was seen yesterday for CP.  ?

## 2021-08-09 ENCOUNTER — Inpatient Hospital Stay
Admit: 2021-08-09 | Discharge: 2021-08-09 | Disposition: A | Discharge status: Home / Self Care | Payer: Medicare Other | Attending: Internal Medicine | Admitting: Internal Medicine

## 2021-08-09 DIAGNOSIS — I2699 Other pulmonary embolism without acute cor pulmonale: Secondary | ICD-10-CM | POA: Diagnosis not present

## 2021-08-09 DIAGNOSIS — I2693 Single subsegmental pulmonary embolism without acute cor pulmonale: Secondary | ICD-10-CM | POA: Diagnosis not present

## 2021-08-09 LAB — CBC
HCT: 26.4 % — ABNORMAL LOW (ref 39.0–52.0)
Hemoglobin: 9.1 g/dL — ABNORMAL LOW (ref 13.0–17.0)
MCH: 32.3 pg (ref 26.0–34.0)
MCHC: 34.5 g/dL (ref 30.0–36.0)
MCV: 93.6 fL (ref 80.0–100.0)
Platelets: 170 10*3/uL (ref 150–400)
RBC: 2.82 MIL/uL — ABNORMAL LOW (ref 4.22–5.81)
RDW: 14.3 % (ref 11.5–15.5)
WBC: 4.9 10*3/uL (ref 4.0–10.5)
nRBC: 0 % (ref 0.0–0.2)

## 2021-08-09 LAB — GLUCOSE, CAPILLARY
Glucose-Capillary: 182 mg/dL — ABNORMAL HIGH (ref 70–99)
Glucose-Capillary: 179 mg/dL — ABNORMAL HIGH (ref 70–99)

## 2021-08-09 LAB — BASIC METABOLIC PANEL
Anion gap: 10 (ref 5–15)
BUN: 51 mg/dL — ABNORMAL HIGH (ref 8–23)
CO2: 29 mmol/L (ref 22–32)
Calcium: 9.3 mg/dL (ref 8.9–10.3)
Chloride: 96 mmol/L — ABNORMAL LOW (ref 98–111)
Creatinine, Ser: 3.33 mg/dL — ABNORMAL HIGH (ref 0.61–1.24)
GFR, Estimated: 19 mL/min — ABNORMAL LOW (ref >60)
Glucose, Bld: 179 mg/dL — ABNORMAL HIGH (ref 70–99)
Potassium: 3.1 mmol/L — ABNORMAL LOW (ref 3.5–5.1)
Sodium: 135 mmol/L (ref 135–145)

## 2021-08-09 LAB — ECHOCARDIOGRAM COMPLETE
AR max vel: 2.7 cm2
AV Area VTI: 2.7 cm2
AV Area mean vel: 2.53 cm2
AV Mean grad: 2 mmHg
AV Peak grad: 4.1 mmHg
Ao pk vel: 1.01 m/s
Area-P 1/2: 2.51 cm2
Height: 73 in
MV VTI: 1.16 cm2
P 1/2 time: 402 msec
S' Lateral: 3.54 cm
Weight: 2940.8 oz

## 2021-08-09 LAB — HEPATITIS B SURFACE ANTIBODY,QUALITATIVE: Hep B S Ab: NONREACTIVE

## 2021-08-09 LAB — HEPATITIS B SURFACE ANTIGEN: Hepatitis B Surface Ag: NONREACTIVE

## 2021-08-09 LAB — HEPARIN LEVEL (UNFRACTIONATED)
Heparin Unfractionated: 0.67 IU/mL (ref 0.30–0.70)
Heparin Unfractionated: 0.79 IU/mL — ABNORMAL HIGH (ref 0.30–0.70)

## 2021-08-09 LAB — PROCALCITONIN: Procalcitonin: <0.1 ng/mL

## 2021-08-09 LAB — TROPONIN I (HIGH SENSITIVITY): Troponin I (High Sensitivity): 20 ng/L — ABNORMAL HIGH (ref <18)

## 2021-08-09 MED ORDER — CHLORHEXIDINE GLUCONATE CLOTH 2 % EX PADS
6.0000 | MEDICATED_PAD | Freq: Every day | CUTANEOUS | Status: DC
  Administered 2021-08-09: 6 via TOPICAL

## 2021-08-09 MED ORDER — ALTEPLASE 2 MG IJ SOLR: 2.0000 mg | Freq: Once | INTRAMUSCULAR | Status: DC | PRN

## 2021-08-09 MED ORDER — HEPARIN SODIUM (PORCINE) 1000 UNIT/ML DIALYSIS: 1000.0000 [IU] | INTRAMUSCULAR | Status: DC | PRN

## 2021-08-09 MED ORDER — MIDODRINE HCL 10 MG PO TABS
10.0000 mg | ORAL_TABLET | ORAL | Status: AC
Start: 2021-08-09 — End: ?

## 2021-08-09 MED ORDER — GABAPENTIN 100 MG PO CAPS: 100.0000 mg | ORAL_CAPSULE | Freq: Two times a day (BID) | ORAL | Status: DC | PRN

## 2021-08-09 MED ORDER — APIXABAN 5 MG PO TABS: 5.0000 mg | ORAL_TABLET | Freq: Two times a day (BID) | ORAL | 2 refills | Status: AC

## 2021-08-09 MED ORDER — HEPARIN SODIUM (PORCINE) 1000 UNIT/ML IJ SOLN
INTRAMUSCULAR | Status: AC
  Filled 2021-08-09: qty 10

## 2021-08-09 MED ORDER — DOCUSATE SODIUM 50 MG/5ML PO LIQD: 100.0000 mg | Freq: Two times a day (BID) | ORAL | 0 refills | Status: AC | PRN

## 2021-08-09 MED ORDER — LIDOCAINE HCL (PF) 1 % IJ SOLN
5.0000 mL | INTRAMUSCULAR | Status: DC | PRN
  Filled 2021-08-09: qty 5

## 2021-08-09 MED ORDER — SODIUM CHLORIDE 0.9 % IV SOLN: 100.0000 mL | INTRAVENOUS | Status: DC | PRN

## 2021-08-09 MED ORDER — PANTOPRAZOLE SODIUM 40 MG PO TBEC: 40.0000 mg | DELAYED_RELEASE_TABLET | Freq: Two times a day (BID) | ORAL | Status: AC

## 2021-08-09 MED ORDER — APIXABAN 5 MG PO TABS
5.0000 mg | ORAL_TABLET | Freq: Two times a day (BID) | ORAL | Status: DC
Start: 2021-08-09 — End: 2021-08-10
  Administered 2021-08-09: 5 mg via ORAL
  Filled 2021-08-09: qty 1

## 2021-08-09 MED ORDER — PENTAFLUOROPROP-TETRAFLUOROETH EX AERO: 1.0000 "application " | INHALATION_SPRAY | CUTANEOUS | Status: DC | PRN

## 2021-08-09 MED ORDER — DOCUSATE SODIUM 50 MG/5ML PO LIQD
100.0000 mg | Freq: Two times a day (BID) | ORAL | Status: DC | PRN
  Filled 2021-08-09: qty 10

## 2021-08-09 MED ORDER — MIDODRINE HCL 5 MG PO TABS
ORAL_TABLET | ORAL | Status: AC
  Filled 2021-08-09: qty 2

## 2021-08-09 MED ORDER — DICLOFENAC SODIUM 1 % EX GEL
2.0000 g | Freq: Four times a day (QID) | CUTANEOUS | 0 refills | Status: AC
Start: 2021-08-09 — End: ?

## 2021-08-09 MED ORDER — LIDOCAINE-PRILOCAINE 2.5-2.5 % EX CREA: 1.0000 "application " | TOPICAL_CREAM | CUTANEOUS | Status: DC | PRN

## 2021-08-09 MED ORDER — DICLOFENAC EPOLAMINE 1.3 % EX PTCH
1.0000 | MEDICATED_PATCH | Freq: Two times a day (BID) | CUTANEOUS | Status: DC
  Filled 2021-08-09 (×2): qty 1

## 2021-08-09 NOTE — Care Management Obs Status (Signed)
MEDICARE OBSERVATION STATUS NOTIFICATION ? ? ?Patient Details  ?Name: Alex Hampton ?MRN: [id number] ?Date of Birth: 05/15/1955 ? ? ?Medicare Observation Status Notification Given:  Yes ? ? ? ?Laurena Slimmer, RN ?08/09/2021, 3:39 PM ?

## 2021-08-09 NOTE — Care Management CC44 (Signed)
Condition Code 44 Documentation Completed ? ?Patient Details  ?Name: Alex Hampton ?MRN: [id number] ?Date of Birth: 09/09/1955 ? ? ?Condition Code 44 given:  Yes ?Patient signature on Condition Code 44 notice:  Yes ?Documentation of 2 MD's agreement:  Yes ?Code 44 added to claim:  Yes ? ? ? ?Laurena Slimmer, RN ?08/09/2021, 3:39 PM ? ?

## 2021-08-09 NOTE — Care Management CC44 (Signed)
Condition Code 44 Documentation Completed ? ?Patient Details  ?Name: Alex Hampton ?MRN: [id number] ?Date of Birth: 1956/02/17 ? ? ?Condition Code 44 given:  Yes ?Patient signature on Condition Code 44 notice:  Yes ?Documentation of 2 MD's agreement:  Yes ?Code 44 added to claim:  Yes ? ? ? ?Laurena Slimmer, RN ?08/09/2021, 3:36 PM ? ?

## 2021-08-09 NOTE — Consult Note (Signed)
ANTICOAGULATION CONSULT NOTE - Initial Consult ? ?Pharmacy Consult for heparin infusion ?Indication: pulmonary embolus ? ?No Known Allergies ? ?Patient Measurements: ?Height: '6\' 1"'$  (185.4 cm) ?Weight: 83.4 kg (183 lb 12.8 oz) ?IBW/kg (Calculated) : 79.9 ?Heparin Dosing Weight: 83.5 kg  ? ?Vital Signs: ?Temp: 97.6 ?F (36.4 ?C) (05/02 0025) ?Temp Source: Oral (05/01 1944) ?BP: 96/55 (05/02 0025) ?Pulse Rate: 67 (05/02 0025) ? ?Labs: ?Recent Labs  ?  08/07/21 ?1239 08/07/21 ?1248 08/08/21 ?0956 08/09/21 ?0019  ?HGB 9.1*  --  10.5*  --   ?HCT 26.9*  --  32.0*  --   ?PLT 187  --  190  --   ?HEPARINUNFRC  --   --   --  0.79*  ?CREATININE 2.61*  --  3.12*  --   ?TROPONINIHS 20* 19*  --   --   ? ? ? ?Estimated Creatinine Clearance: 26 mL/min (A) (by C-G formula based on SCr of 3.12 mg/dL (H)). ? ? ?Medical History: ?Past Medical History:  ?Diagnosis Date  ? Depression   ? History of cardiac cath   ? History of heart artery stent   ? Hyperlipidemia   ? Hypertension   ? MI (myocardial infarction) ([place])   ? Renal disorder   ? ? ?Medications:  ?No prior anticoagulation noted  ? ?Assessment: ?66 y.o. male with a history of a liver transplant in 2021, end-stage renal disease who presents with complaints of chest discomfort as well as left lower back pain. Found to have PE on imaging. Pharmacy consulted to initiate and manage heparin infusion ? ?Goal of Therapy:  ?Heparin level 0.3-0.7 units/ml ?Monitor platelets by anticoagulation protocol: Yes ? ?5/02 0019 HL 0.79, supratherapeutic ?  ?Plan:  ?Decrease heparin infusion to 1300 units/hr ?Recheck HL in 8 hr after rate change. ?Continue to monitor H&H and platelets ? ?Renda Rolls, PharmD, MBA ?08/09/2021 ?1:38 AM ? ? ? ?

## 2021-08-09 NOTE — Progress Notes (Signed)
Discharge instructions explained to pt/ verbalized an understanding/ ivs and tele removed/ will transport off unit via wheelchair when ride arrives 

## 2021-08-09 NOTE — Plan of Care (Signed)
  Problem: Education: Goal: Knowledge of General Education information will improve Description: Including pain rating scale, medication(s)/side effects and non-pharmacologic comfort measures Outcome: Progressing   Problem: Health Behavior/Discharge Planning: Goal: Ability to manage health-related needs will improve Outcome: Progressing   Problem: Clinical Measurements: Goal: Respiratory complications will improve Outcome: Progressing   Problem: Pain Managment: Goal: General experience of comfort will improve Outcome: Progressing   Problem: Safety: Goal: Ability to remain free from injury will improve Outcome: Progressing   Problem: Skin Integrity: Goal: Risk for impaired skin integrity will decrease Outcome: Progressing   

## 2021-08-09 NOTE — TOC Initial Note (Signed)
Transition of Care (TOC) - Initial/Assessment Note  ? ? ?Patient Details  ?Name: Alex Hampton ?MRN: [id number] ?Date of Birth: 12-16-1955 ? ?Transition of Care (TOC) CM/SW Contact:    ?Laurena Slimmer, RN ?Phone Number: ?08/09/2021, 1:44 PM ? ?Clinical Narrative:                 ?Patient admitted from home for PE and CAP. Is active with outpatient therapy. ? ?Transition of Care (TOC) Screening Note ? ? ?Patient Details  ?Name: Alex Hampton ?Date of Birth: Jan 23, 1956 ? ? ?Transition of Care (TOC) CM/SW Contact:    ?Laurena Slimmer, RN ?Phone Number: ?08/09/2021, 1:48 PM ? ? ? ?Transition of Care Department [facility]) has reviewed patient and no TOC needs have been identified at this time. We will continue to monitor patient advancement through interdisciplinary progression rounds. If new patient transition needs arise, please place a TOC consult. ?  ? ?  ?  ? ? ?Patient Goals and CMS Choice ?  ?  ?  ? ?Expected Discharge Plan and Services ?  ?  ?  ?  ?  ?                ?  ?  ?  ?  ?  ?  ?  ?  ?  ?  ? ?Prior Living Arrangements/Services ?  ?  ?  ?       ?  ?  ?  ?  ? ?Activities of Daily Living ?Home Assistive Devices/Equipment: Eyeglasses, Gilford Rile (specify type), Blood pressure cuff, Shower chair with back ?ADL Screening (condition at time of admission) ?Patient's cognitive ability adequate to safely complete daily activities?: Yes ?Is the patient deaf or have difficulty hearing?: No ?Does the patient have difficulty seeing, even when wearing glasses/contacts?: No ?Does the patient have difficulty concentrating, remembering, or making decisions?: No ?Patient able to express need for assistance with ADLs?: Yes ?Does the patient have difficulty dressing or bathing?: No ?Independently performs ADLs?: Yes (appropriate for developmental age) ?Does the patient have difficulty walking or climbing stairs?: No ?Weakness of Legs: None ?Weakness of Arms/Hands: None ? ?Permission Sought/Granted ?  ?  ?   ?   ?   ?   ? ?Emotional Assessment ?   ?  ?  ?  ?  ?  ? ?Admission diagnosis:  Pulmonary embolism ([place]) [I26.99] ?Community acquired pneumonia of right middle lobe of lung [J18.9] ?Single subsegmental pulmonary embolism without acute cor pulmonale ([place]) [I26.93] ?Patient Active Problem List  ? Diagnosis Date Noted  ? Pulmonary embolism ([place]) 08/08/2021  ? CAP (community acquired pneumonia) 08/08/2021  ? Protein calorie malnutrition ([place]) 08/08/2021  ? Hypokalemia 08/08/2021  ? Pancreatic cyst   ? Verbal auditory hallucination   ? Early satiety   ? Feeding tube dysfunction   ? Generalized weakness 11/26/2020  ? 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[place]) 11/26/2020  ? Recurrent falls 11/26/2020  ? Syncope 10/15/2020  ? Uremia 10/10/2020  ? ESRD on hemodialysis ([place]) 10/10/2020  ? Hyperkalemia 10/10/2020  ? Elevated troponin 10/10/2020  ? GERD (gastroesophageal reflux disease) 10/10/2020  ? CAD (coronary artery disease) 10/10/2020  ? G tube feedings ([place]) 10/10/2020  ? Diarrhea 10/10/2020  ? Diabetes mellitus ([place]) 10/10/2020  ? Weakness   ? ACS (acute coronary syndrome) ([place])   ? Liver transplant recipient [facility])   ? Anemia of chronic disease   ? Type 2 diabetes mellitus with diabetic neuropathy, with long-term current use of insulin ([place])   ?  ESRD (end stage renal disease) ([place]) 08/19/2020  ? History of anemia due to chronic kidney disease 08/19/2020  ? Gait abnormality 10/17/2019  ? History of fall within past 90 days 10/17/2019  ? Visual hallucination 10/17/2019  ? Cirrhosis of liver with ascites ([place]) on CT 10/17/2019  ? HTN (hypertension) 10/17/2019  ? History of MI (myocardial infarction) 10/17/2019  ? OSA (obstructive sleep apnea) 10/17/2019  ? Nondisplaced comminuted supracondylar fracture without intercondylar fracture of left humerus, subsequent encounter for fracture with nonunion 10/17/2019  ? Anxiety and depression 10/17/2019  ? ?PCP:  Juluis Pitch, MD ?Pharmacy:   ?[email or web address]?Phone: [phone]: [phone]? ? ? ? ?Social Determinants of Health (SDOH) Interventions ?  ? ?Readmission Risk Interventions ? ?  11/29/2020  ?  5:19 PM 10/11/2020  ? 11:29 AM  ?Readmission Risk Prevention Plan  ?Transportation Screening Complete Complete  ?PCP or Specialist Appt within 3-5 Days  Complete  ?[place] Consult  Complete  ?Social Work Consult for [place]/Counseling  Complete  ?Palliative Care Screening  Not Applicable  ?Medication Review (Press photographer Complete Complete  ?[place] Home Care Consult Complete   ?SW Recovery Care/Counseling Consult Complete   ?Palliative Care Screening Complete   ?[place] Applic[place]   ? ? ? ?

## 2021-08-09 NOTE — Progress Notes (Signed)
?[facility]  ?ROUNDING NOTE  ? ?Subjective:  ? ?Alex Hampton is a 66 year old male with past medical conditions including NASH cirrhosis with liver transplant, JG tube for malnutrition, pancreatic pseudocyst, and end-stage renal disease on hemodialysis.  Patient presents to the emergency department with chest and back pain.  Patient has been admitted under observation for Pulmonary embolism ([place]) [I26.99] ?Community acquired pneumonia of right middle lobe of lung [J18.9] ?Single subsegmental pulmonary embolism without acute cor pulmonale ([place]) [I26.93] ? ?Patient is known to our practice and receives outpatient dialysis treatments at [facility] on a TTS schedule supervised by Dr. Holley Raring.  He completed his last treatment on Saturday.  Patient has presented to the emergency room twice within 24 hours with complaints of chest pain.  He describes this pain is persistent with radiation to lower back.  He does also experience shortness of breath when this occurs.  Denies nausea or diaphoresis.  He was initially ruled out for acute coronary syndrome. ? ?Labs on arrival show sodium 132, potassium 3.3,, glucose 211, BUN 47, and creatinine 3.12 with GFR 21.  CT abdomen pelvis shows hypodense pancreatic lesions likely pseudocyst with recommendation of follow-up in a year.  CT angio chest shows patchy consolidation with pulmonary embolus in right lower lobe. ? ?We have been consulted to manage dialysis needs during this admission. ? ? ?Objective:  ?Vital signs in last 24 hours:  ?Temp:  [97.6 ?F (36.4 ?C)-98.5 ?F (36.9 ?C)] 98.1 ?F (36.7 ?C) (05/02 1120) ?Pulse Rate:  [64-75] 69 (05/02 1316) ?Resp:  [4-20] 13 (05/02 1316) ?BP: (96-148)/(55-99) 103/72 (05/02 1316) ?SpO2:  [98 %-100 %] 100 % (05/02 1200) ?Weight:  [83.4 kg-85.2 kg] 85.2 kg (05/02 1125) ? ?Weight change:  ?Filed Weights  ? 08/08/21 2001 08/09/21 0842 08/09/21 1125  ?Weight: 83.4 kg 83.6 kg 85.2 kg  ? ? ?Intake/Output: ?I/O last 3 completed  shifts: ?In: 1213.6 [P.O.:120; I.V.:221.1; NG/GT:622.5; IV Piggyback:250] ?Out: -  ?  ?Intake/Output this shift: ? Total I/O ?In: 120 [P.O.:120] ?Out: 0  ? ?Physical Exam: ?General: NAD, resting quietly  ?Head: Normocephalic, atraumatic. Moist oral mucosal membranes  ?Eyes: Anicteric  ?Lungs:  Clear to auscultation, normal effort  ?Heart: Regular rate and rhythm  ?Abdomen:  Soft, nontender, JG tube  ?Extremities: No peripheral edema.  ?Neurologic: Nonfocal, moving all four extremities  ?Skin: No lesions  ?Access: Right IJ PermCath  ? ? ?Basic Metabolic Panel: ?Recent Labs  ?Lab 08/07/21 ?1239 08/08/21 ?7412 08/09/21 ?8786  ?NA 133* 132* 135  ?K 3.2* 3.3* 3.1*  ?CL 92* 93* 96*  ?CO2 '28 28 29  '$ ?GLUCOSE 170* 211* 179*  ?BUN 34* 47* 51*  ?CREATININE 2.61* 3.12* 3.33*  ?CALCIUM 9.4 9.7 9.3  ? ? ?Liver Function Tests: ?Recent Labs  ?Lab 08/07/21 ?1239  ?AST 25  ?ALT 20  ?ALKPHOS 118  ?BILITOT 0.6  ?PROT 6.7  ?ALBUMIN 3.7  ? ?No results for input(s): LIPASE, AMYLASE in the last 168 hours. ?No results for input(s): AMMONIA in the last 168 hours. ? ?CBC: ?Recent Labs  ?Lab 08/07/21 ?1239 08/08/21 ?7672 08/09/21 ?0947  ?WBC 6.3 7.6 4.9  ?HGB 9.1* 10.5* 9.1*  ?HCT 26.9* 32.0* 26.4*  ?MCV 92.1 94.1 93.6  ?PLT 187 190 170  ? ? ?Cardiac Enzymes: ?No results for input(s): CKTOTAL, CKMB, CKMBINDEX, TROPONINI in the last 168 hours. ? ?BNP: ?Invalid input(s): POCBNP ? ?CBG: ?Recent Labs  ?Lab 08/08/21 ?1613 08/08/21 ?2008 08/08/21 ?2332 08/09/21 ?0962 08/09/21 ?0744  ?GLUCAP 141* 151* 142*  182* 179*  ? ? ?Microbiology: ?Results for orders placed or performed during the hospital encounter of 08/08/21  ?Blood culture (single)     Status: None (Preliminary result)  ? Collection Time: 08/08/21  3:29 PM  ? Specimen: BLOOD  ?Result Value Ref Range Status  ? Specimen Description BLOOD BLOOD LEFT FOREARM  Final  ? Special Requests   Final  ?  BOTTLES DRAWN AEROBIC AND ANAEROBIC Blood Culture results may not be optimal due to an excessive  volume of blood received in culture bottles  ? Culture   Final  ?  NO GROWTH < 12 HOURS ?Performed at [facility], [address], [place], [place] [address] ?  ? Report Status PENDING  Incomplete  ? ? ?Coagulation Studies: ?No results for input(s): LABPROT, INR in the last 72 hours. ? ?Urinalysis: ?Recent Labs  ?  08/08/21 ?1445  ?COLORURINE YELLOW*  ?LABSPEC 1.023  ?PHURINE 5.0  ?GLUCOSEU NEGATIVE  ?HGBUR SMALL*  ?BILIRUBINUR NEGATIVE  ?KETONESUR NEGATIVE  ?PROTEINUR NEGATIVE  ?NITRITE NEGATIVE  ?LEUKOCYTESUR TRACE*  ?  ? ? ?Imaging: ?CT Angio Chest PE W and/or Wo Contrast ? ?Result Date: 08/08/2021 ?CLINICAL DATA:  Left lower back pain, weakness, liver transplant, chest pain. EXAM: CT ANGIOGRAPHY CHEST WITH CONTRAST TECHNIQUE: Multidetector CT imaging of the chest was performed using the standard protocol during bolus administration of intravenous contrast. Multiplanar CT image reconstructions and MIPs were obtained to evaluate the vascular anatomy. RADIATION DOSE REDUCTION: This exam was performed according to the departmental dose-optimization program which includes automated exposure control, adjustment of the mA and/or kV according to patient size and/or use of iterative reconstruction technique. CONTRAST:  63m OMNIPAQUE IOHEXOL 350 MG/ML SOLN COMPARISON:  03/08/2021. FINDINGS: Cardiovascular: Single subsegmental pulmonary arterial filling defect in the right lower lobe (6/262). Coronary artery calcification. Heart is enlarged. No pericardial effusion. Right IJ dialysis catheter terminates in the right atrium. Mediastinum/Nodes: No pathologically enlarged mediastinal, hilar or axillary lymph nodes. Esophagus is grossly unremarkable. Lungs/Pleura: Image quality is slightly degraded by expiratory phase imaging, which creates increased density in the lungs. Minimal patchy consolidation and nodularity in the perihilar right middle lobe (5/70 5-79). Subsegmental scarring in the posterior right lower  lobe. No pleural fluid. Posterior pleural thickening bilaterally. Airway is unremarkable. Upper Abdomen: Liver transplant. Visualized portions of the adrenal glands, spleen, pancreas, stomach and bowel are grossly unremarkable. Musculoskeletal: Degenerative changes in the spine. No worrisome lytic or sclerotic lesions. Review of the MIP images confirms the above findings. IMPRESSION: 1. Single subsegmental pulmonary embolus in the right lower lobe, of questionable clinical significance. 2. Minimal patchy consolidation and nodularity in the central right middle lobe, likely infectious/inflammatory in etiology. 3. Coronary artery calcification. Electronically Signed   By: MLorin PicketM.D.   On: 08/08/2021 13:46  ? ?CT ABDOMEN PELVIS W CONTRAST ? ?Result Date: 08/08/2021 ?CLINICAL DATA:  Left lower quadrant pain and left back pain. History of kidney stones. Weakness. Liver transplant. EXAM: CT ABDOMEN AND PELVIS WITH CONTRAST TECHNIQUE: Multidetector CT imaging of the abdomen and pelvis was performed using the standard protocol following bolus administration of intravenous contrast. RADIATION DOSE REDUCTION: This exam was performed according to the departmental dose-optimization program which includes automated exposure control, adjustment of the mA and/or kV according to patient size and/or use of iterative reconstruction technique. CONTRAST:  792mOMNIPAQUE IOHEXOL 350 MG/ML SOLN COMPARISON:  03/01/2021. FINDINGS: Lower chest: Full CT chest dictated separately. Hepatobiliary: Liver transplant. Liver is grossly unremarkable. Cholecystectomy. Similar biliary ductal dilatation. Pancreas:  7 mm low-attenuation lesion in the head of the pancreas (2/40). Hyperdense lesion off the uncinate process of the pancreas measures 14 mm and 47 Hounsfield units (2/45), similar dating back to 10/16/2019. Mildly hyperdense fluid collections of the tail the pancreas measure up to 1.9 x 2.2 cm, similar as well. No gland atrophy or  ductal dilatation. Spleen: Negative. Adrenals/Urinary Tract: Adrenal glands and kidneys are unremarkable. Ureters are decompressed. Bladder is grossly unremarkable. Stomach/Bowel: Percutaneous gastro jejunostomy. S

## 2021-08-09 NOTE — Consult Note (Addendum)
?[facility] CARDIOLOGY CONSULT NOTE  ? ?    ?Patient ID: ?Alex Hampton ?MRN: [id number] ?DOB/AGE: 04-11-1955 65 y.o. ? ?Admit date: 08/08/2021 ?Referring Physician Dr. Enzo Bi ?Primary Physician Dr. Juluis Pitch ?Primary Cardiologist Dr. Isaias Cowman ?Reason for Consultation chest pain  ? ?HPI: Alex Hampton is a 1yoM with a PMH of CAD s/p inferior STEMI with DES  ostial/prox LCx 02/20/2003 & NSTEMI with heart cath revealing patent stent ostial/proximal LCx, and 99% stenosis distal small caliber RCA with collaterals 08/19/2020, NASH cirrhosis s/p liver transplant 02/18/2020, ESRD on hemodialysis T TH S with associated hypotension (midodrine on dialysis days), type 2 diabetes, hypertension, hyperlipidemia, malnutrition s/p GJT on nocturnal tube feeds who presented to [facility] ED initially 08/07/2021 with chest pain and was discharged home after negative serial troponins and reassuring EKG.  He came back to [facility] ED 5/1 with similar constant chest pain.  Cardiology is consulted for further assistance. ? ?The patient states he has had constant left sided chest tightness that he can localize with some associated pain in his back. He rated it a 4 out of 10 initially two days ago, 5/10 yesterday, and 4/10 during interview today. He says says she he has felt better since he was started on heparin. Laying flat makes the pain somewhat worse and sitting up and walking around makes it slightly better. He says this is different than when he had his heart attacks when his pain was more central in his chest and was a "different tightness." Compliant with his aspirin and plavix since May 2022. Denies exertional symptoms, associated diaphoresis. He also admits to a dry cough for the past two days, denies fever, chills, sick contacts. No missed dialysis treatments. Denies shortness of breath, palpitations, dizziness, presyncope, orthopnea, or [place].  ? ?Vitals are notable for a blood pressure of 139/76, heart rate 69, O2 sat 99%  on room air. His labs are notable for a potassium of 3.1, BUN creatinine 51/3.33, GFR 19.  High-sensitivity troponin 20-19-20, procalcitonin less than 0.10.  H&H 9.1/26.4.  CT angio notable for a single subsegmental PE in the right lower lobe, patchy consolidation nodularity in the central right middle lobe, likely infectious/inflammatory in etiology, with coronary artery calcifications. ? ? ?Review of systems complete and found to be negative unless listed above  ? ? ? ?Past Medical History:  ?Diagnosis Date  ? Depression   ? History of cardiac cath   ? History of heart artery stent   ? Hyperlipidemia   ? Hypertension   ? MI (myocardial infarction) ([place])   ? Renal disorder   ?  ?Past Surgical History:  ?Procedure Laterality Date  ? IR GJ TUBE CHANGE  11/22/2020  ? IR GJ TUBE CHANGE  11/26/2020  ? IR REPLC GASTRO/COLONIC TUBE PERCUT W/FLUORO  10/14/2020  ? LEFT HEART CATH AND CORONARY ANGIOGRAPHY N/A 08/19/2020  ? Procedure: LEFT HEART CATH AND CORONARY ANGIOGRAPHY;  Surgeon: Corey Skains, MD;  Location: [facility];  Service: Cardiovascular;  Laterality: N/A;  ? LIVER TRANSPLANT    ?  ?Medications Prior to Admission  ?Medication Sig Dispense Refill Last Dose  ? acetaminophen (TYLENOL) 325 MG tablet Take 650 mg by mouth every 8 (eight) hours as needed for mild pain or moderate pain.   Past Month  ? aspirin 81 MG EC tablet Take 81 mg by mouth daily.   08/08/2021  ? atorvastatin (LIPITOR) 40 MG tablet Take 40 mg by mouth at bedtime.   08/07/2021  ?  busPIRone (BUSPAR) 5 MG tablet Take 2.5 mg by mouth 2 (two) times daily.   08/08/2021  ? clopidogrel (PLAVIX) 75 MG tablet Take 1 tablet (75 mg total) by mouth daily. 30 tablet 0 08/08/2021  ? docusate (COLACE) 50 MG/5ML liquid Take 100 mg by mouth 2 (two) times daily.   Past Month  ? escitalopram (LEXAPRO) 10 MG tablet Take 10 mg by mouth daily.   08/08/2021  ? famotidine (PEPCID) 40 MG tablet Take 40 mg by mouth at bedtime.   08/08/2021  ? ferrous sulfate 325 (65 FE) MG EC  tablet Take 325 mg by mouth every morning.   08/08/2021  ? gabapentin (NEURONTIN) 100 MG capsule Take 100 mg by mouth 2 (two) times daily.   08/08/2021  ? lidocaine (LIDODERM) 5 % Place 1 patch onto the skin every 12 (twelve) hours. Remove & Discard patch within 12 hours or as directed by MD 10 patch 0 Past Month  ? melatonin 3 MG TABS tablet Take 9 mg by mouth at bedtime.   08/07/2021  ? metoCLOPramide (REGLAN) 5 MG tablet Take 5 mg by mouth 3 (three) times daily before meals.   08/08/2021  ? metoprolol tartrate (LOPRESSOR) 25 MG tablet Take 25 mg by mouth 2 (two) times daily.   08/08/2021  ? mirtazapine (REMERON) 15 MG tablet Take 15 mg by mouth at bedtime.   08/07/2021  ? mycophenolate (CELLCEPT) 250 MG capsule Take 2 capsules (500 mg total) by mouth 2 (two) times daily.   08/08/2021  ? mycophenolate (MYFORTIC) 360 MG TBEC EC tablet Take 360 mg by mouth every 12 (twelve) hours.   08/08/2021  ? Nutritional Supplements (NOVASOURCE RENAL) LIQD Give by tube. Novasource Renal @ 75 ml/hr 8 hours /night (10 pm -8 am)   08/07/2021  ? pantoprazole (PROTONIX) 40 MG tablet Take 1 tablet (40 mg total) by mouth daily. (Patient taking differently: Take 40 mg by mouth every 12 (twelve) hours.)   08/08/2021  ? tacrolimus (PROGRAF) 1 MG capsule Take 1 mg by mouth every 12 (twelve) hours.   08/08/2021  ? traZODone (DESYREL) 100 MG tablet Take 50 mg by mouth at bedtime.   08/07/2021  ? BELSOMRA 10 MG TABS Take by mouth. (Patient not taking: Reported on 08/08/2021)   Not Taking  ? DAYVIGO 5 MG TABS Take by mouth. (Patient not taking: Reported on 08/08/2021)   Not Taking  ? midodrine (PROAMATINE) 10 MG tablet Take 1 tablet (10 mg total) by mouth daily. (Patient taking differently: Take 10 mg by mouth Every Tuesday,Thursday,and Saturday with dialysis.) 30 tablet 0 08/06/2021  ? nitroGLYCERIN (NITROSTAT) 0.4 MG SL tablet Place 1 tablet (0.4 mg total) under the tongue every 5 (five) minutes as needed for chest pain. 30 tablet 0 prn  ? NOVOLIN R 100 UNIT/ML  injection Inject 0-11 Units into the skin See admin instructions. Take 6 units at 6pm plus correction with meal time. Correction scale with meals.  Glucose Range  <200 none, 201 - 250 mg/dL 1 unit, 251 - 300 mg/dL 2 units, 301 - 350 mg/dL 3 units, 351- 400 mg/dl 4 units , >400 mg/dL take 5 units and call your provider   prn  ? ondansetron (ZOFRAN-ODT) 4 MG disintegrating tablet Take 4 mg by mouth 2 (two) times daily as needed for nausea.   prn  ? predniSONE (DELTASONE) 5 MG tablet Take 2.5 mg by mouth daily with breakfast. (Patient not taking: Reported on 08/08/2021)   Not Taking  ? senna-docusate (SENOKOT-S)  8.6-50 MG tablet Take 2 tablets by mouth 2 (two) times daily as needed for mild constipation or moderate constipation.   prn  ? tacrolimus (PROGRAF) 0.5 MG capsule Take 1 mg by mouth every 12 (twelve) hours. (Patient not taking: Reported on 08/08/2021)   Not Taking  ? ?Social History  ? ?Socioeconomic History  ? Marital status: Divorced  ?  Spouse name: Not on file  ? Number of children: Not on file  ? Years of education: Not on file  ? Highest education level: Not on file  ?Occupational History  ? Not on file  ?Tobacco Use  ? Smoking status: Former  ? Smokeless tobacco: Never  ?Substance and Sexual Activity  ? Alcohol use: Yes  ?  Comment: occasionally  ? Drug use: Never  ? Sexual activity: Not on file  ?Other Topics Concern  ? Not on file  ?Social History Narrative  ? Not on file  ? ?Social Determinants of Health  ? ?Financial Resource Strain: Not on file  ?Food Insecurity: Not on file  ?Transportation Needs: Not on file  ?Physical Activity: Not on file  ?Stress: Not on file  ?Social Connections: Not on file  ?Intimate Partner Violence: Not on file  ?  ?Family History  ?Problem Relation Age of Onset  ? Diabetes Mellitus II Mother   ? Pancreatic cancer Father   ?  ? ?PHYSICAL EXAM ?General: Pleasant Caucasian male , well nourished, in no acute distress. Laying at incline in PCU bed. ?HEENT:  Normocephalic and  atraumatic. ?Neck:  No JVD.  ?Lungs: Normal respiratory effort on room air. Clear bilaterally to auscultation. No wheezes, crackles, rhonchi.  ?Heart: HRRR . Normal S1 and S2 without gallops or murmurs. [place]

## 2021-08-09 NOTE — Discharge Summary (Signed)
? ?Physician Discharge Summary ? ? ?Alex Hampton  male DOB: 04-07-1956  ?VQQ:[id number] ? ?PCP: Juluis Pitch, MD ? ?Admit date: 08/08/2021 ?Discharge date: 08/09/2021 ? ?Admitted From: home ?Disposition:  home ?CODE STATUS: DNR ? ?Discharge Instructions   ? ? Discharge instructions   Complete by: As directed ?  ? Cardiology has seen you in the hospital and does not think your chest pain is cardiac related.  It's likely from the soft tissues around the ribs.  You can try heat pad, lidocaine patch, or diclofenac gel. ? ?You have a small blood clot in your lung.  Please take Eliquis for 3 months.  Please stop aspirin while you are taking Plavix and Eliquis. ? ? ?Dr. Enzo Bi ?- ?-  ? No wound care   Complete by: As directed ?  ? ?  ? ?Hospital Course:  ?For full details, please see H&P, progress notes, consult notes and ancillary notes.  ?Briefly,  ?Alex Hampton is a 66 y.o. 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 ? ?Patient states that he developed chest pain over the anterior chest wall while at rest associated with shortness of breath.  He was seen in the ER and ruled out for an acute coronary syndrome and discharged home since his troponin was flat.  He presented 24 hours later with complaints of persistent chest pain over the left anterior chest wall with radiation to his lower back.  Chest pain is nonpleuritic.  ? ?He has a cough which is occasionally productive of clear phlegm and he has had that for about a month.   ?  ?Chest pain, atypical, likely 2/2 ?Costochondritis ?--cardiology consulted.  Per cardio, Overall his chest  pain does not seem consistent with coronary ischemia, and his troponin has been flat at 20 on several checks.  Echocardiogram shows an EF of 50% with no new wall motion abnormalities.  No need for further cardiac work-up at this time. ?--chest pain was located over anterior left lower chest pain, pin-point, just under the surface.  Given hx of cough, pain is likely MSK.  Diclofenac gel ordered. ? ?CAD (coronary artery disease) ?Status post stent angioplasty ?Continue Plavix, atorvastatin  ?--ASA held since pt was started on Eliquis ? ?* Pulmonary embolism ([place]) ?Patient presents for evaluation of chest pain and shortness of breath. ?CT angiogram of the chest which showed single subsegmental pulmonary embolus in the right lower lobe ?Admitting physician discussed with vascular surgery who agrees with anticoagulation for now unless there is a contraindication. ?--pt was started on heparin gtt and transitioned to Eliquis at discharge.  Since pt was on ASA and plavix PTA, ASA was held while pt is on Eliquis. ?  ?CAP, ruled oout ?He has a cough which is occasionally productive of clear phlegm but denies having any fever or chills.  CXR clear, however, CTA chest showed minimal patchy consolidation and nodularity in the central right middle lobe, therefore, pt was started on ceftriaxone and azithromycin on admission. ?--given no fever, no leukocytosis, no hypoxia and procal neg, pt is unlikely to have bacterial PNA.  Abx d/c'ed. ?  ?Liver transplant recipient [facility]) ?Status post liver transplant for cirrhosis related  to [place] ?Continue immunosuppressive therapy with home mycophenolate and tacrolimus ?  ?ESRD on hemodialysis ([place]) on HD TTS ?--pt received dialysis on his home scheduled day prior to discharge. ?  ?Protein calorie malnutrition ([place]) ?Patient has a history of protein calorie malnutrition and has a PEG tube in place ?Continue nocturnal tube feeds from 10 PM to 8 AM ?  ?GERD (gastroesophageal reflux  disease) ?Stable ?Continue Pepcid ?  ?Anxiety and depression ?Stable ?Continue home BuSpar, Lexapro, mirtazapine and trazodone ?  ?Hypokalemia ?Supplemented potassium ?  ?Diabetes mellitus ([place]) ?A1c 6.3, well controlled.  Pt was discharged on home regimen. ?  ? ?Discharge Diagnoses:  ?Principal Problem: ?  Pulmonary embolism ([place]) ?Active Problems: ?  CAP (community acquired pneumonia) ?  Liver transplant recipient [facility]) ?  ESRD on hemodialysis ([place]) ?  Protein calorie malnutrition ([place]) ?  Anxiety and depression ?  GERD (gastroesophageal reflux disease) ?  CAD (coronary artery disease) ?  Hypokalemia ?  Diabetes mellitus ([place]) ? ? ?30 Day Unplanned Readmission Risk Score   ? ?Flowsheet Row ED to Hosp-Admission (Current) from 08/08/2021 in [facility]  ?30 Day Unplanned Readmission Risk Score (%) 38.71 Filed at 08/09/2021 1200  ? ?  ? ? This score is the patient's risk of an unplanned readmission within 30 days of being discharged (0 -100%). The score is based on dignosis, age, lab data, medications, orders, and past utilization.   ?Low:  0-14.9   Medium: 15-21.9   High: 22-29.9   Extreme: 30 and above ? ?  ? ?  ? ? ?Discharge Instructions: ? ?Allergies as of 08/09/2021   ?No Known Allergies ?  ? ?  ?Medication List  ?  ? ?STOP taking these medications   ? ?aspirin 81 MG EC tablet ?  ?Belsomra 10 MG Tabs ?Generic drug: Suvorexant ?  ?DayVigo 5 MG Tabs ?Generic drug: Lemborexant ?  ?predniSONE 5 MG tablet ?Commonly known as: DELTASONE ?  ? ?  ? ?TAKE these medications   ? ?acetaminophen 325 MG tablet ?Commonly known as: TYLENOL ?Take 650 mg by mouth every 8 (eight) hours as needed for mild pain or moderate pain. ?  ?apixaban 5 MG Tabs tablet ?Commonly known as: ELIQUIS ?Take 1 tablet (5 mg total) by mouth 2 (two) times daily. ?  ?atorvastatin 40 MG tablet ?Commonly known as: LIPITOR ?Take 40 mg by mouth at bedtime. ?  ?busPIRone 5 MG tablet ?Commonly known as: BUSPAR ?Take 2.5 mg by mouth 2  (two) times daily. ?  ?clopidogrel 75 MG tablet ?Commonly known as: PLAVIX ?Take 1 tablet (75 mg total) by mouth daily. ?  ?diclofenac Sodium 1 % Gel ?Commonly known as: VOLTAREN ?Apply 2 g topically 4 (four) times daily. ?  ?docusate 50 MG/5ML liquid ?Commonly known as: COLACE ?Take 10 mLs (100 mg total) by mouth 2 (two) times daily as needed for mild constipation. ?What changed:  ?when to take this ?reasons to take this ?  ?escitalopram 10 MG tablet ?Commonly known as: LEXAPRO ?Take 10 mg by mouth daily. ?  ?famotidine 40 MG tablet ?Commonly known as: PEPCID ?Take 40 mg by mouth at bedtime. ?  ?ferrous sulfate 325 (65 FE) MG EC tablet ?Take 325 mg by mouth every morning. ?  ?gabapentin 100 MG capsule ?Commonly known as: NEURONTIN ?Take 100 mg by mouth 2 (two) times daily. ?  ?lidocaine 5 % ?Commonly known as: Lidoderm ?Place 1 patch onto the skin every 12 (twelve) hours. Remove & Discard patch  within 12 hours or as directed by MD ?  ?melatonin 3 MG Tabs tablet ?Take 9 mg by mouth at bedtime. ?  ?metoCLOPramide 5 MG tablet ?Commonly known as: REGLAN ?Take 5 mg by mouth 3 (three) times daily before meals. ?  ?metoprolol tartrate 25 MG tablet ?Commonly known as: LOPRESSOR ?Take 25 mg by mouth 2 (two) times daily. ?  ?midodrine 10 MG tablet ?Commonly known as: PROAMATINE ?Take 1 tablet (10 mg total) by mouth Every Tuesday,Thursday,and Saturday with dialysis. Home med. ?What changed:  ?when to take this ?additional instructions ?  ?mirtazapine 15 MG tablet ?Commonly known as: REMERON ?Take 15 mg by mouth at bedtime. ?  ?mycophenolate 250 MG capsule ?Commonly known as: CELLCEPT ?Take 2 capsules (500 mg total) by mouth 2 (two) times daily. ?  ?mycophenolate 360 MG Tbec EC tablet ?Commonly known as: MYFORTIC ?Take 360 mg by mouth every 12 (twelve) hours. ?  ?nitroGLYCERIN 0.4 MG SL tablet ?Commonly known as: NITROSTAT ?Place 1 tablet (0.4 mg total) under the tongue every 5 (five) minutes as needed for chest pain. ?   ?NovaSource Renal Liqd ?Give by tube. Novasource Renal @ 75 ml/hr 8 hours /night (10 pm -8 am) ?  ?NovoLIN R 100 units/mL injection ?Generic drug: insulin regular ?Inject 0-11 Units into the skin See admin instructions.

## 2021-08-09 NOTE — Progress Notes (Signed)
ANTICOAGULATION CONSULT NOTE - Initial Consult ? ?Pharmacy Consult for heparin infusion ?Indication: pulmonary embolus ? ?No Known Allergies ? ?Patient Measurements: ?Height: '6\' 1"'$  (185.4 cm) ?Weight: 85.2 kg (187 lb 13.3 oz) ?IBW/kg (Calculated) : 79.9 ?Heparin Dosing Weight: 83.5 kg  ? ?Vital Signs: ?Temp: 98.1 ?F (36.7 ?C) (05/02 1120) ?BP: 104/71 (05/02 1245) ?Pulse Rate: 67 (05/02 1245) ? ?Labs: ?Recent Labs  ?  08/07/21 ?1239 08/07/21 ?1248 08/08/21 ?0956 08/09/21 ?7939 08/09/21 ?0300 08/09/21 ?9233  ?HGB 9.1*  --  10.5*  --  9.1*  --   ?HCT 26.9*  --  32.0*  --  26.4*  --   ?PLT 187  --  190  --  170  --   ?HEPARINUNFRC  --   --   --  0.79*  --  0.67  ?CREATININE 2.61*  --  3.12*  --  3.33*  --   ?TROPONINIHS 20* 19*  --   --   --  20*  ? ? ? ?Estimated Creatinine Clearance: 24.3 mL/min (A) (by C-G formula based on SCr of 3.33 mg/dL (H)). ? ? ?Medical History: ?Past Medical History:  ?Diagnosis Date  ? Depression   ? History of cardiac cath   ? History of heart artery stent   ? Hyperlipidemia   ? Hypertension   ? MI (myocardial infarction) ([place])   ? Renal disorder   ? ? ?Medications:  ?No prior anticoagulation noted  ? ?Assessment: ?66 y.o. male with a history of a liver transplant in 2021, end-stage renal disease who presents with complaints of chest discomfort as well as left lower back pain. Found to have PE on imaging. Pharmacy consulted to initiate and manage heparin infusion ? ?Goal of Therapy:  ?Heparin level 0.3-0.7 units/ml ?Monitor platelets by anticoagulation protocol: Yes ? ?Results ?5/02 0019 HL 0.79, supratherapeutic ?5/02 0912 HL 0.67, therapeutic x 1 ?  ?Plan:  ?Continue heparin infusion at 1300 units/hr ?Recheck HL in 8 hr to confirm therapeutic rate ?Continue to monitor H&H and platelets ? ?Valina Maes Rodriguez-Guzman PharmD, BCPS ?08/09/2021 12:49 PM ? ? ? ?

## 2021-08-10 LAB — HEPATITIS B SURFACE ANTIBODY, QUANTITATIVE: Hep B S AB Quant (Post): <3.1 m[IU]/mL — ABNORMAL LOW (ref >9.9)

## 2021-08-12 ENCOUNTER — Ambulatory Visit: Payer: Medicare Other | Attending: Gastroenterology

## 2021-08-12 DIAGNOSIS — R296 Repeated falls: Secondary | ICD-10-CM | POA: Insufficient documentation

## 2021-08-12 DIAGNOSIS — R2689 Other abnormalities of gait and mobility: Secondary | ICD-10-CM | POA: Insufficient documentation

## 2021-08-12 DIAGNOSIS — M6281 Muscle weakness (generalized): Secondary | ICD-10-CM | POA: Insufficient documentation

## 2021-08-12 DIAGNOSIS — R269 Unspecified abnormalities of gait and mobility: Secondary | ICD-10-CM | POA: Insufficient documentation

## 2021-08-12 DIAGNOSIS — R278 Other lack of coordination: Secondary | ICD-10-CM | POA: Insufficient documentation

## 2021-08-12 DIAGNOSIS — R262 Difficulty in walking, not elsewhere classified: Secondary | ICD-10-CM | POA: Insufficient documentation

## 2021-08-12 DIAGNOSIS — R2681 Unsteadiness on feet: Secondary | ICD-10-CM | POA: Insufficient documentation

## 2021-08-13 LAB — CULTURE, BLOOD (SINGLE): Culture: NO GROWTH

## 2021-08-16 ENCOUNTER — Ambulatory Visit: Payer: Medicare Other | Admitting: Physical Therapy

## 2021-08-17 ENCOUNTER — Ambulatory Visit: Payer: Medicare Other

## 2021-08-17 DIAGNOSIS — R269 Unspecified abnormalities of gait and mobility: Secondary | ICD-10-CM | POA: Diagnosis present

## 2021-08-17 DIAGNOSIS — R278 Other lack of coordination: Secondary | ICD-10-CM | POA: Diagnosis present

## 2021-08-17 DIAGNOSIS — R2689 Other abnormalities of gait and mobility: Secondary | ICD-10-CM | POA: Diagnosis present

## 2021-08-17 DIAGNOSIS — R2681 Unsteadiness on feet: Secondary | ICD-10-CM | POA: Diagnosis present

## 2021-08-17 DIAGNOSIS — R262 Difficulty in walking, not elsewhere classified: Secondary | ICD-10-CM

## 2021-08-17 DIAGNOSIS — R296 Repeated falls: Secondary | ICD-10-CM | POA: Diagnosis present

## 2021-08-17 DIAGNOSIS — M6281 Muscle weakness (generalized): Secondary | ICD-10-CM | POA: Diagnosis present

## 2021-08-17 NOTE — Therapy (Signed)
[facility] ?[place], [place], [address] ?Phone: [phone]   Fax:  [phone] ? ?Physical Therapy Treatment ? ?Patient Details  ?Name: Alex Hampton ?MRN: [id number] ?Date of Birth: 1955-12-27 ?No data recorded ? ?Encounter Date: 08/17/2021 ? ? PT End of Session - 08/17/21 1538   ? ? Visit Number 35   ? Number of Visits 40   ? Date for PT Re-Evaluation 10/12/21   ? Authorization Type Medicare/BCBS; PN 03/23/21   ? Authorization Time Period Initial Cert= 73/71/0626- 94/85/4627; Recert 0/35/0093-11/26/2991: Recert 10/23/9676- 12/11/8099   ? Progress Note Due on Visit 40   ? PT Start Time 1522   ? PT Stop Time 1600   ? PT Time Calculation (min) 38 min   ? Equipment Utilized During Treatment Gait belt   ? Activity Tolerance Patient limited by fatigue;Patient tolerated treatment well;No increased pain   ? Behavior During Therapy [facility] for tasks assessed/performed   ? ?  ?  ? ?  ? ? ?Past Medical History:  ?Diagnosis Date  ? Depression   ? History of cardiac cath   ? History of heart artery stent   ? Hyperlipidemia   ? Hypertension   ? MI (myocardial infarction) ([place])   ? Renal disorder   ? ? ?Past Surgical History:  ?Procedure Laterality Date  ? IR GJ TUBE CHANGE  11/22/2020  ? IR GJ TUBE CHANGE  11/26/2020  ? IR REPLC GASTRO/COLONIC TUBE PERCUT W/FLUORO  10/14/2020  ? LEFT HEART CATH AND CORONARY ANGIOGRAPHY N/A 08/19/2020  ? Procedure: LEFT HEART CATH AND CORONARY ANGIOGRAPHY;  Surgeon: Corey Skains, MD;  Location: [facility];  Service: Cardiovascular;  Laterality: N/A;  ? LIVER TRANSPLANT    ? ? ?There were no vitals filed for this visit. ? ? Subjective Assessment - 08/17/21 1531   ? ? Subjective Pt doing ok today. He was at ED recently and now   ? Pertinent History Patient is a 66 year old male with recent referral for abnomality of gait and recent liver transplant in November 2021. He has past medical history significant for Liver transplant  (02/18/2020), CKD, End stage renal disease- On hemodialyis T/TH/Sat., Anemia, DM with neuropathy, Non-stemi, Coronary artery disease, Feeding tube with poor appetite, PE now on eliquis   ? Patient Stated Goals I want to return to my normal self- Independent with all mobility, not fall, with improved overall stamina.   ? Currently in Pain? No/denies   ? ?  ?  ? ?  ? ? ?INTERVENTION THIS DATE:  ?There ex: ?Superset:  ?Exercise 1: lap 160 ft with cues for wide BOS  ?Exercise 2: STS 10x, VC's for upright posture and increased glute activation ?  ?Exercise 1: lap 160 ft with cues for widened BOS. VC's for reciprocal arm swing ?Exercise 2: alternating LAQ 10x each LE, 2.5# AW's/LE ?*seated on airex pad, increased weight this session) ?  ?Exercise 1: lap 160 ft with cues for reciprocal arm swing  ?Exercise 2: standing march with 4# AW (progressed from seated) ?  ?Exercise 1: lap 160 ft with close CGA  ?Exercise 2: standing hip abduction with 4# AWs at treadmill bar, x10/LE.   ?  ?Exercise 1: lap 160 ft with close CGA  ?*leaves thereafter due to prior engagement, not wanting to be late  ? ? PT Education - 08/17/21 1545   ? ? Education Details safety with movement   ? Person(s) Educated  Patient   ? Methods Explanation   ? Comprehension Verbalized understanding;Need further instruction   ? ?  ?  ? ?  ? ? ? PT Short Term Goals - 04/29/21 1058   ? ?  ? PT SHORT TERM GOAL #1  ? Title Pt will be independent with HEP in order to improve strength and balance in order to decrease fall risk and improve function at home and work.   ? Baseline 02/02/2021: Patient reports limited HEP in place currently from [facility] 12/14 compliance; 1/20/2023Patient states no questions regarding his current exercise regimen and states he is compliant.   ? Time 6   ? Period Weeks   ? Status Achieved   ? Target Date 03/16/21   ? ?  ?  ? ?  ? ? ? ? PT Long Term Goals - 07/20/21 1526   ? ?  ? PT LONG TERM GOAL #1  ? Title Pt will improve FOTO to target  score of 50 to display perceived improvements in ability to complete ADL's.   ? Baseline 02/02/2021= 43 12/143: 51%; 04/29/2021= 53%   ? Time 12   ? Period Weeks   ? Status Achieved   ?  ? PT LONG TERM GOAL #2  ? Title Pt will decrease 5TSTS by at least 8 seconds in order to demonstrate clinically significant improvement in LE strength.   ? Baseline 02/02/2021= 28.22 sec with BUE Support 12/14: 23.1 seconds; 04/29/2021=Unable to test today due to patient became nauseous with 6 min walk test and attempted TUG- unable to continue- will reassess next 1-2 visits; 06/08/2021= 25.1 sec with min BUE support; 07/20/2021= 17.75 with min BUE support   ? Time 12   ? Period Weeks   ? Status Achieved   ? Target Date 07/22/21   ?  ? PT LONG TERM GOAL #3  ? Title Pt will decrease TUG to below 19 seconds/decrease in order to demonstrate decreased fall risk.   ? Baseline 02/02/2021= 27.30 sec without AD 12/14: 23 seconds; 04/29/2021- Unable to assess as patient became very nauseated upon standing and request to stop treatment. 06/08/2021= 19.10 sec without an AD; 07/20/2021= 15.68 sec without UE Support   ? Time 12   ? Period Weeks   ? Status Achieved   ? Target Date 07/22/21   ?  ? PT LONG TERM GOAL #4  ? Title Pt will increase 6MWT by at least 4m(1676f in order to demonstrate clinically significant improvement in cardiopulmonary endurance and community ambulation   ? Baseline 02/02/2021= 83 feet in 1 min 10 sec wihtout an AD 12/14: 200 ft in  48m29mtes; 04/29/2021=Patient ambulated approx 60 feet yet had to stop due onset of nausea and unable to complete today; 07/15/2021= 330 feet in 2 min 25 sec without AD   ? Time 12   ? Period Weeks   ? Status Partially Met   ? Target Date 07/22/21   ?  ? PT LONG TERM GOAL #5  ? Title Pt will increase 10MWT by at least 0.2 m/s in order to demonstrate clinically significant improvement in community ambulation.   ? Baseline 02/02/2021= 0.42 m/s 12/14: 0.49 m/s; 04/29/2021=Patient unable to test today  secondary to being nauseated. 06/08/2021= 0.62 m/s; 07/20/2021= 0.87 m/s   ? Time 12   ? Period Weeks   ? Status Achieved   ? Target Date 07/22/21   ?  ? Additional Long Term Goals  ? Additional Long Term Goals Yes   ?  ?  PT LONG TERM GOAL #6  ? Title Pt will decrease 5TSTS by at least 3 seconds in order to demonstrate clinically significant improvement in LE strength.   ? Baseline 07/20/2021= 17.75 sec with Minimal BUE Support   ? Time 12   ? Period Weeks   ? Status New   ? Target Date 10/12/21   ? ?  ?  ? ?  ? ? ? ? ? ? ? ? Plan - 08/17/21 1545   ? ? Clinical Impression Statement Continued focus on general conditioning and improved activity tolerance. Lots of wlaking activity with recovery intervals taken seated ~2-3 minutes due to exertional dyspnea. Pt feel she continues to make progress toward goals in general.   ? Personal Factors and Comorbidities Comorbidity 3+   ? Comorbidities DM, Liver transplant, ESRD, CAD, feeding tube   ? Examination-Activity Limitations Carry;Lift;Reach Overhead;Squat;Stairs;Stand;Transfers;Toileting   ? Examination-Participation Restrictions Cleaning;Community Activity;Driving;Medication Management;Meal Prep;[place] Work   ? Stability/Clinical Decision Making Evolving/Moderate complexity   ? Clinical Decision Making Moderate   ? Rehab Potential Fair   ? PT Frequency 2x / week   ? PT Duration 12 weeks   ? PT Treatment/Interventions ADLs/Self Care Home Management;Cryotherapy;Moist Heat;DME Instruction;Gait training;Stair training;Functional mobility training;Therapeutic activities;Therapeutic exercise;Balance training;Neuromuscular re-education;Patient/family education;Wheelchair mobility training;Manual techniques;Passive range of motion;Dry needling;Energy conservation;Joint Manipulations   ? PT Next Visit Plan Progressive Therapeutic exercises, Balance training, transfer/gait training   ? PT Home Exercise Plan No changes or updates today.   ? Consulted and Agree with Plan of Care Patient    ? ?  ?  ? ?  ? ? ?Patient will benefit from skilled therapeutic intervention in order to improve the following deficits and impairments:  Abnormal gait, Cardiopulmonary status limiting activity, Decreased a

## 2021-08-18 ENCOUNTER — Ambulatory Visit: Payer: Medicare Other | Admitting: Physical Therapy

## 2021-08-19 ENCOUNTER — Ambulatory Visit: Payer: Medicare Other | Admitting: Physical Therapy

## 2021-08-23 ENCOUNTER — Ambulatory Visit: Payer: Medicare Other

## 2021-08-24 ENCOUNTER — Ambulatory Visit: Payer: Medicare Other

## 2021-08-24 DIAGNOSIS — R262 Difficulty in walking, not elsewhere classified: Secondary | ICD-10-CM

## 2021-08-24 DIAGNOSIS — R269 Unspecified abnormalities of gait and mobility: Secondary | ICD-10-CM | POA: Diagnosis not present

## 2021-08-24 DIAGNOSIS — R2681 Unsteadiness on feet: Secondary | ICD-10-CM

## 2021-08-24 DIAGNOSIS — R278 Other lack of coordination: Secondary | ICD-10-CM

## 2021-08-24 DIAGNOSIS — R2689 Other abnormalities of gait and mobility: Secondary | ICD-10-CM

## 2021-08-24 DIAGNOSIS — R296 Repeated falls: Secondary | ICD-10-CM

## 2021-08-24 DIAGNOSIS — M6281 Muscle weakness (generalized): Secondary | ICD-10-CM

## 2021-08-24 NOTE — Therapy (Signed)
[place] MAIN [facility] SERVICES [address] [place], [place], [address] Phone: [phone]   Fax:  [phone]  Physical Therapy Treatment  Patient Details  Name: Alex Hampton MRN: [id number] Date of Birth: [birth year] No data recorded  Encounter Date: 08/24/2021   PT End of Session - 08/24/21 1354     Visit Number 36    Number of Visits 40    Date for PT Re-Evaluation 10/12/21    Authorization Type Medicare/BCBS; PN 03/23/21    Authorization Time Period Initial Cert= 81/27/5170- 01/74/9449; Recert 6/75/9163-8/46/6599: Recert 3/57/0177- 12/11/9028    Progress Note Due on Visit 40    PT Start Time 0923    PT Stop Time 1416    PT Time Calculation (min) 29 min    Equipment Utilized During Treatment Gait belt    Activity Tolerance Patient limited by fatigue;Patient tolerated treatment well;No increased pain    Behavior During Therapy WFL for tasks assessed/performed             Past Medical History:  Diagnosis Date   Depression    History of cardiac cath    History of heart artery stent    Hyperlipidemia    Hypertension    MI (myocardial infarction) ([place])    Renal disorder     Past Surgical History:  Procedure Laterality Date   IR [place] TUBE CHANGE  11/22/2020   IR [place] TUBE CHANGE  11/26/2020   IR [place] GASTRO/COLONIC TUBE PERCUT W/FLUORO  10/14/2020   LEFT HEART CATH AND CORONARY ANGIOGRAPHY N/A 08/19/2020   Procedure: LEFT HEART CATH AND CORONARY ANGIOGRAPHY;  Surgeon: Corey Skains, MD;  Location: [facility];  Service: Cardiovascular;  Laterality: N/A;   LIVER TRANSPLANT      There were no vitals filed for this visit.   Subjective Assessment - 08/24/21 1429     Subjective Patient reports he was very fatigued after last session and states he won't be able to do as much today secondary to having some stomach issues.    Pertinent History Patient is a 66 year old male with recent referral for abnomality of gait and recent  liver transplant in November 2021. He has past medical history significant for Liver transplant (02/18/2020), CKD, End stage renal disease- On hemodialyis T/TH/Sat., Anemia, DM with neuropathy, Non-stemi, Coronary artery disease, Feeding tube with poor appetite, PE now on eliquis    Patient Stated Goals I want to return to my normal self- Independent with all mobility, not fall, with improved overall stamina.    Currently in Pain? No/denies            INTERVENTIONS:   Therapeutic Exercises: for improved LE strength, balance, and mobility goals.   Forward step over 1/2 foam x 4 in // bars x 4 down and back with minimal to no UE support   Side step up/over 1/2 foam x 4 in // bars x 1 - Required rest break- no UE support x 2 in total.    Standing Hip march-x 10 reps alt LE with 3lb AW  Standing heel raises-x 10 reps BLE  with 3lb AW  Standing Hip ext x 10 reps alt LE with 3lb AW  Standing hip abd x 10 reps alt LE with 3 lb AW  Sit to stand with minimal UE support x 10 reps   Seated Knee ext Alt LE 3# AW x 10 reps.  Patient required brief rest break between exercises- able to recover  faster.    Education provided throughout session via VC/TC and demonstration to facilitate movement at target joints and correct muscle activation for all testing and exercises performed.                               PT Education - 08/25/21 1949     Education Details Exercise technique    Person(s) Educated Patient    Methods Explanation;Demonstration;Tactile cues;Verbal cues    Comprehension Verbalized understanding;Returned demonstration;Verbal cues required;Tactile cues required;Need further instruction              PT Short Term Goals - 04/29/21 1058       PT SHORT TERM GOAL #1   Title Pt will be independent with HEP in order to improve strength and balance in order to decrease fall risk and improve function at home and work.    Baseline 02/02/2021: Patient  reports limited HEP in place currently from [facility] agency 12/14 compliance; 1/20/2023Patient states no questions regarding his current exercise regimen and states he is compliant.    Time 6    Period Weeks    Status Achieved    Target Date 03/16/21               PT Long Term Goals - 07/20/21 1526       PT LONG TERM GOAL #1   Title Pt will improve FOTO to target score of 50 to display perceived improvements in ability to complete ADL's.    Baseline 02/02/2021= 43 12/143: 51%; 04/29/2021= 53%    Time 12    Period Weeks    Status Achieved      PT LONG TERM GOAL #2   Title Pt will decrease 5TSTS by at least 8 seconds in order to demonstrate clinically significant improvement in LE strength.    Baseline 02/02/2021= 28.22 sec with BUE Support 12/14: 23.1 seconds; 04/29/2021=Unable to test today due to patient became nauseous with 6 min walk test and attempted TUG- unable to continue- will reassess next 1-2 visits; 06/08/2021= 25.1 sec with min BUE support; 07/20/2021= 17.75 with min BUE support    Time 12    Period Weeks    Status Achieved    Target Date 07/22/21      PT LONG TERM GOAL #3   Title Pt will decrease TUG to below 19 seconds/decrease in order to demonstrate decreased fall risk.    Baseline 02/02/2021= 27.30 sec without AD 12/14: 23 seconds; 04/29/2021- Unable to assess as patient became very nauseated upon standing and request to stop treatment. 06/08/2021= 19.10 sec without an AD; 07/20/2021= 15.68 sec without UE Support    Time 12    Period Weeks    Status Achieved    Target Date 07/22/21      PT LONG TERM GOAL #4   Title Pt will increase 6MWT by at least 320m(1687f in order to demonstrate clinically significant improvement in cardiopulmonary endurance and community ambulation    Baseline 02/02/2021= 83 feet in 1 min 10 sec wihtout an AD 12/14: 200 ft in  20m76mtes; 04/29/2021=Patient ambulated approx 60 feet yet had to stop due onset of nausea and unable to complete today;  07/15/2021= 330 feet in 2 min 25 sec without AD    Time 12    Period Weeks    Status Partially Met    Target Date 07/22/21      PT LONG TERM GOAL #5  Title Pt will increase 10MWT by at least 0.2 m/s in order to demonstrate clinically significant improvement in community ambulation.    Baseline 02/02/2021= 0.42 m/s 12/14: 0.49 m/s; 04/29/2021=Patient unable to test today secondary to being nauseated. 06/08/2021= 0.62 m/s; 07/20/2021= 0.87 m/s    Time 12    Period Weeks    Status Achieved    Target Date 07/22/21      Additional Long Term Goals   Additional Long Term Goals Yes      PT LONG TERM GOAL #6   Title Pt will decrease 5TSTS by at least 3 seconds in order to demonstrate clinically significant improvement in LE strength.    Baseline 07/20/2021= 17.75 sec with Minimal BUE Support    Time 12    Period Weeks    Status New    Target Date 10/12/21                   Plan - 08/24/21 1356     Clinical Impression Statement Patient continued to focus on LE strengthening and functional endurance. He was able to stand longer but his stomach was bothering him so not able to add walking as in previous visit. He continues to benefit from brief rest breaks to be able to continue with therapeutic exercises. He responded well to [facility] and able to return demo well today but continues to be most limited by weakness and fatigue. Patient will continue to benefit from skilled physical therapy intervention in order to improve his strength, mobility, and overall quality of life    Personal Factors and Comorbidities Comorbidity 3+    Comorbidities DM, Liver transplant, ESRD, CAD, feeding tube    Examination-Activity Limitations Carry;Lift;Reach Overhead;Squat;Stairs;Stand;Transfers;Toileting    Examination-Participation Restrictions Cleaning;Community Activity;Driving;Medication Management;Meal Prep;Yard Work    [employer] officer Evolving/Moderate complexity    Rehab Potential Fair     PT Frequency 2x / week    PT Duration 12 weeks    PT Treatment/Interventions ADLs/Self Care Home Management;Cryotherapy;Moist Heat;DME Instruction;Gait training;Stair training;Functional mobility training;Therapeutic activities;Therapeutic exercise;Balance training;Neuromuscular re-education;Patient/family education;Wheelchair mobility training;Manual techniques;Passive range of motion;Dry needling;Energy conservation;Joint Manipulations    PT Next Visit Plan Progressive Therapeutic exercises, Balance training, transfer/gait training    PT Home Exercise Plan No changes or updates today.    Consulted and Agree with Plan of Care Patient             Patient will benefit from skilled therapeutic intervention in order to improve the following deficits and impairments:  Abnormal gait, Cardiopulmonary status limiting activity, Decreased activity tolerance, Decreased balance, Decreased coordination, Decreased endurance, Decreased mobility, Decreased range of motion, Decreased strength, Difficulty walking, Hypomobility, Impaired flexibility, Impaired UE functional use  Visit Diagnosis: Abnormality of gait and mobility  Difficulty in walking, not elsewhere classified  Muscle weakness (generalized)  Other abnormalities of gait and mobility  Other lack of coordination  Unsteadiness on feet  Repeated falls     Problem List Patient Active Problem List   Diagnosis Date Noted   Pulmonary embolism ([place]) 08/08/2021   CAP (community acquired pneumonia) 08/08/2021   Protein calorie malnutrition ([place]) 08/08/2021   Hypokalemia 08/08/2021   Pancreatic cyst    Verbal auditory hallucination    Early satiety    Feeding tube dysfunction    Generalized weakness 11/26/2020   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[place]) 11/26/2020   Recurrent falls 11/26/2020   Syncope 10/15/2020   Uremia 10/10/2020   ESRD on hemodialysis ([place]) 10/10/2020   Hyperkalemia 10/10/2020   Elevated troponin  10/10/2020   GERD  (gastroesophageal reflux disease) 10/10/2020   CAD (coronary artery disease) 10/10/2020   G tube feedings ([place]) 10/10/2020   Diarrhea 10/10/2020   Diabetes mellitus ([place]) 10/10/2020   Weakness    ACS (acute coronary syndrome) ([place])    Liver transplant recipient [facility])    Anemia of chronic disease    Type 2 diabetes mellitus with diabetic neuropathy, with long-term current use of insulin ([place])    ESRD (end stage renal disease) ([place]) 08/19/2020   History of anemia due to chronic kidney disease 08/19/2020   Gait abnormality 10/17/2019   History of fall within past 90 days 10/17/2019   Visual hallucination 10/17/2019   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

## 2021-08-25 ENCOUNTER — Ambulatory Visit: Payer: Medicare Other

## 2021-08-26 ENCOUNTER — Ambulatory Visit: Payer: Medicare Other

## 2021-08-26 DIAGNOSIS — R262 Difficulty in walking, not elsewhere classified: Secondary | ICD-10-CM

## 2021-08-26 DIAGNOSIS — R269 Unspecified abnormalities of gait and mobility: Secondary | ICD-10-CM | POA: Diagnosis not present

## 2021-08-26 DIAGNOSIS — R278 Other lack of coordination: Secondary | ICD-10-CM

## 2021-08-26 DIAGNOSIS — R2689 Other abnormalities of gait and mobility: Secondary | ICD-10-CM

## 2021-08-26 DIAGNOSIS — M6281 Muscle weakness (generalized): Secondary | ICD-10-CM

## 2021-08-26 DIAGNOSIS — R2681 Unsteadiness on feet: Secondary | ICD-10-CM

## 2021-08-26 NOTE — Therapy (Signed)
[place] MAIN [facility] SERVICES [address] [place], [place], [address] Phone: [phone]   Fax:  [phone]  Physical Therapy Treatment  Patient Details  Name: Alex Hampton MRN: [id number] Date of Birth: 1956-01-11 No data recorded  Encounter Date: 08/26/2021   PT End of Session - 08/26/21 0950     Visit Number 37    Number of Visits 40    Date for PT Re-Evaluation 10/12/21    Authorization Type Medicare/BCBS; PN 03/23/21    Authorization Time Period Initial Cert= 85/27/7824- 23/53/6144; Recert 06/23/4006-6/76/1950: Recert 9/32/6712- 07/13/8097    Progress Note Due on Visit 40    PT Start Time 0934    PT Stop Time 1014    PT Time Calculation (min) 40 min    Equipment Utilized During Treatment Gait belt    Activity Tolerance Patient limited by fatigue;Patient tolerated treatment well;No increased pain    Behavior During Therapy WFL for tasks assessed/performed             Past Medical History:  Diagnosis Date   Depression    History of cardiac cath    History of heart artery stent    Hyperlipidemia    Hypertension    MI (myocardial infarction) ([place])    Renal disorder     Past Surgical History:  Procedure Laterality Date   IR GJ TUBE CHANGE  11/22/2020   IR [place] TUBE CHANGE  11/26/2020   IR [place] GASTRO/COLONIC TUBE PERCUT W/FLUORO  10/14/2020   LEFT HEART CATH AND CORONARY ANGIOGRAPHY N/A 08/19/2020   Procedure: LEFT HEART CATH AND CORONARY ANGIOGRAPHY;  Surgeon: Corey Skains, MD;  Location: [place] [facility];  Service: Cardiovascular;  Laterality: N/A;   LIVER TRANSPLANT      There were no vitals filed for this visit.   Subjective Assessment - 08/26/21 0949     Subjective Pt did not sleep well last night, is more fatigued today. His stomach is giving him some trouble.    Pertinent History Patient is a 66 year old male with recent referral for abnomality of gait and recent liver transplant in November 2021. He has past  medical history significant for Liver transplant (02/18/2020), CKD, End stage renal disease- On hemodialyis T/TH/Sat., Anemia, DM with neuropathy, Non-stemi, Coronary artery disease, Feeding tube with poor appetite, PE now on eliquis    Patient Stated Goals I want to return to my normal self- Independent with all mobility, not fall, with improved overall stamina.    Currently in Pain? No/denies            INTERVENTIONS:    Therapeutic Exercises: for improved LE strength, balance, and mobility goals.    -Forward step over 1/2 foam x 4 in // bars x 2 down and back with minimal to no UE support (added 2lb AW bilat)    *seated recovery due to exertional dyspnea  -Side step up/over 1/2 foam x 4 in // bars x 3 - no UE support x 2 in total. (added 2lb AW bilat)   *seated recovery due to exertional dyspnea  -Standing Hip march-x 10 reps alt LE with 3lb AW  *seated recovery due to exertional dyspnea  -Standing heel raises-x 15 reps BLE with 3lb AW   *seated recovery due to exertional dyspnea  Standing Hip ext x 10 reps alt LE with 3lb AW    *seated recovery due to exertional dyspnea  Standing hip abd x 10 reps alt LE with 3  lb AW    *seated recovery due to exertional dyspnea   *session limited by increaesd queasiness this date, worse after interventions   Patient required brief rest break between exercises- able to recover faster.     PT Education - 08/26/21 0957     Education Details cues for bodily awareness with pressure drops    Person(s) Educated Patient    Methods Explanation;Demonstration    Comprehension Need further instruction              PT Short Term Goals - 04/29/21 1058       PT SHORT TERM GOAL #1   Title Pt will be independent with HEP in order to improve strength and balance in order to decrease fall risk and improve function at home and work.    Baseline 02/02/2021: Patient reports limited HEP in place currently from [facility] agency 12/14 compliance;  1/20/2023Patient states no questions regarding his current exercise regimen and states he is compliant.    Time 6    Period Weeks    Status Achieved    Target Date 03/16/21               PT Long Term Goals - 07/20/21 1526       PT LONG TERM GOAL #1   Title Pt will improve FOTO to target score of 50 to display perceived improvements in ability to complete ADL's.    Baseline 02/02/2021= 43 12/143: 51%; 04/29/2021= 53%    Time 12    Period Weeks    Status Achieved      PT LONG TERM GOAL #2   Title Pt will decrease 5TSTS by at least 8 seconds in order to demonstrate clinically significant improvement in LE strength.    Baseline 02/02/2021= 28.22 sec with BUE Support 12/14: 23.1 seconds; 04/29/2021=Unable to test today due to patient became nauseous with 6 min walk test and attempted TUG- unable to continue- will reassess next 1-2 visits; 06/08/2021= 25.1 sec with min BUE support; 07/20/2021= 17.75 with min BUE support    Time 12    Period Weeks    Status Achieved    Target Date 07/22/21      PT LONG TERM GOAL #3   Title Pt will decrease TUG to below 19 seconds/decrease in order to demonstrate decreased fall risk.    Baseline 02/02/2021= 27.30 sec without AD 12/14: 23 seconds; 04/29/2021- Unable to assess as patient became very nauseated upon standing and request to stop treatment. 06/08/2021= 19.10 sec without an AD; 07/20/2021= 15.68 sec without UE Support    Time 12    Period Weeks    Status Achieved    Target Date 07/22/21      PT LONG TERM GOAL #4   Title Pt will increase 6MWT by at least 86m(1659f in order to demonstrate clinically significant improvement in cardiopulmonary endurance and community ambulation    Baseline 02/02/2021= 83 feet in 1 min 10 sec wihtout an AD 12/14: 200 ft in  86m65mtes; 04/29/2021=Patient ambulated approx 60 feet yet had to stop due onset of nausea and unable to complete today; 07/15/2021= 330 feet in 2 min 25 sec without AD    Time 12    Period Weeks     Status Partially Met    Target Date 07/22/21      PT LONG TERM GOAL #5   Title Pt will increase 10MWT by at least 0.2 m/s in order to demonstrate clinically significant improvement in community ambulation.  Baseline 02/02/2021= 0.42 m/s 12/14: 0.49 m/s; 04/29/2021=Patient unable to test today secondary to being nauseated. 06/08/2021= 0.62 m/s; 07/20/2021= 0.87 m/s    Time 12    Period Weeks    Status Achieved    Target Date 07/22/21      Additional Long Term Goals   Additional Long Term Goals Yes      PT LONG TERM GOAL #6   Title Pt will decrease 5TSTS by at least 3 seconds in order to demonstrate clinically significant improvement in LE strength.    Baseline 07/20/2021= 17.75 sec with Minimal BUE Support    Time 12    Period Weeks    Status New    Target Date 10/12/21                   Plan - 08/26/21 0957     Clinical Impression Statement Vital assessment at beginning of session, pt still has baseline orthostatic hypotension without frank dizziness. Today he does tolerate a 1 minutes BP, whereas in December his legs would grow increasingly hypotonic before a 2nd standing BP could be obtained. 2nd BP showing some beginnings of correction or cessation of drop. Pt consistently has intolerance to upright activity, although likely multifactorial certainly a strong component from hypotension. Noted pt still on midodrine only on HD days, question whether increasing to TID 7x/week could have a favorable affect on quality of life. Pt remains motivated, very good awareness of limitation.    Personal Factors and Comorbidities Comorbidity 3+    Comorbidities DM, Liver transplant, ESRD, CAD, feeding tube    Examination-Activity Limitations Carry;Lift;Reach Overhead;Squat;Stairs;Stand;Transfers;Toileting    Examination-Participation Restrictions Cleaning;Community Activity;Driving;Medication Management;Meal Prep;Yard Work    [employer] officer Evolving/Moderate complexity     Clinical Decision Making Moderate    Rehab Potential Fair    PT Frequency 2x / week    PT Duration 12 weeks    PT Treatment/Interventions ADLs/Self Care Home Management;Cryotherapy;Moist Heat;DME Instruction;Gait training;Stair training;Functional mobility training;Therapeutic activities;Therapeutic exercise;Balance training;Neuromuscular re-education;Patient/family education;Wheelchair mobility training;Manual techniques;Passive range of motion;Dry needling;Energy conservation;Joint Manipulations    PT Next Visit Plan Progressive Therapeutic exercises, Balance training, transfer/gait training    PT Home Exercise Plan No changes or updates today.    Consulted and Agree with Plan of Care Patient             Patient will benefit from skilled therapeutic intervention in order to improve the following deficits and impairments:  Abnormal gait, Cardiopulmonary status limiting activity, Decreased activity tolerance, Decreased balance, Decreased coordination, Decreased endurance, Decreased mobility, Decreased range of motion, Decreased strength, Difficulty walking, Hypomobility, Impaired flexibility, Impaired UE functional use  Visit Diagnosis: Abnormality of gait and mobility  Difficulty in walking, not elsewhere classified  Muscle weakness (generalized)  Other abnormalities of gait and mobility  Other lack of coordination  Unsteadiness on feet     Problem List Patient Active Problem List   Diagnosis Date Noted   Pulmonary embolism ([place]) 08/08/2021   CAP (community acquired pneumonia) 08/08/2021   Protein calorie malnutrition ([place]) 08/08/2021   Hypokalemia 08/08/2021   Pancreatic cyst    Verbal auditory hallucination    Early satiety    Feeding tube dysfunction    Generalized weakness 11/26/2020   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[place]) 11/26/2020   Recurrent falls 11/26/2020   Syncope 10/15/2020   Uremia 10/10/2020   ESRD on hemodialysis ([place]) 10/10/2020   Hyperkalemia  10/10/2020   Elevated troponin 10/10/2020   GERD (gastroesophageal reflux disease) 10/10/2020   CAD (  coronary artery disease) 10/10/2020   G tube feedings ([place]) 10/10/2020   Diarrhea 10/10/2020   Diabetes mellitus (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 [place], [place], [address] Phone: [phone]   Fax:  [phone]  Name: Alex Hampton MRN: [id number] Date of Birth: 19-May-1955

## 2021-08-31 ENCOUNTER — Ambulatory Visit: Payer: Medicare Other | Admitting: Physical Therapy

## 2021-08-31 DIAGNOSIS — M6281 Muscle weakness (generalized): Secondary | ICD-10-CM

## 2021-08-31 DIAGNOSIS — R269 Unspecified abnormalities of gait and mobility: Secondary | ICD-10-CM | POA: Diagnosis not present

## 2021-08-31 DIAGNOSIS — R262 Difficulty in walking, not elsewhere classified: Secondary | ICD-10-CM

## 2021-08-31 DIAGNOSIS — R2689 Other abnormalities of gait and mobility: Secondary | ICD-10-CM

## 2021-08-31 NOTE — Therapy (Signed)
[place] MAIN [facility] SERVICES [address] [place], [place], [address] Phone: [phone]   Fax:  [phone]  Physical Therapy Treatment  Patient Details  Name: Alex Hampton MRN: [id number] Date of Birth: 06-02-1955 No data recorded  Encounter Date: 08/31/2021   PT End of Session - 08/31/21 1405     Visit Number 38    Number of Visits 40    Date for PT Re-Evaluation 10/12/21    Authorization Type Medicare/BCBS; PN 03/23/21    Authorization Time Period Initial Cert= 75/91/6384- 66/59/9357; Recert 0/17/7939-0/30/0923: Recert 3/00/7622- 09/10/3352    Progress Note Due on Visit 40    PT Start Time 1345    PT Stop Time 1429    PT Time Calculation (min) 44 min    Equipment Utilized During Treatment Gait belt    Activity Tolerance Patient limited by fatigue;Patient tolerated treatment well;No increased pain    Behavior During Therapy WFL for tasks assessed/performed             Past Medical History:  Diagnosis Date   Depression    History of cardiac cath    History of heart artery stent    Hyperlipidemia    Hypertension    MI (myocardial infarction) ([place])    Renal disorder     Past Surgical History:  Procedure Laterality Date   IR [place] TUBE CHANGE  11/22/2020   IR [place] TUBE CHANGE  11/26/2020   IR [place] GASTRO/COLONIC TUBE PERCUT W/FLUORO  10/14/2020   LEFT HEART CATH AND CORONARY ANGIOGRAPHY N/A 08/19/2020   Procedure: LEFT HEART CATH AND CORONARY ANGIOGRAPHY;  Surgeon: Corey Skains, MD;  Location: [facility];  Service: Cardiovascular;  Laterality: N/A;   LIVER TRANSPLANT      There were no vitals filed for this visit.   Subjective Assessment - 08/31/21 1350     Subjective Pt reports still having difficulty sleeping at night, no falls or LOB since last session.    Pertinent History Patient is a 66 year old male with recent referral for abnomality of gait and recent liver transplant in November 2021. He has past medical  history significant for Liver transplant (02/18/2020), CKD, End stage renal disease- On hemodialyis T/TH/Sat., Anemia, DM with neuropathy, Non-stemi, Coronary artery disease, Feeding tube with poor appetite, PE now on eliquis    Patient Stated Goals I want to return to my normal self- Independent with all mobility, not fall, with improved overall stamina.                                          PT Short Term Goals - 04/29/21 1058       PT SHORT TERM GOAL #1   Title Pt will be independent with HEP in order to improve strength and balance in order to decrease fall risk and improve function at home and work.    Baseline 02/02/2021: Patient reports limited HEP in place currently from [facility] agency 12/14 compliance; 1/20/2023Patient states no questions regarding his current exercise regimen and states he is compliant.    Time 6    Period Weeks    Status Achieved    Target Date 03/16/21               PT Long Term Goals - 07/20/21 1526       PT LONG TERM GOAL #1  Title Pt will improve FOTO to target score of 50 to display perceived improvements in ability to complete ADL's.    Baseline 02/02/2021= 43 12/143: 51%; 04/29/2021= 53%    Time 12    Period Weeks    Status Achieved      PT LONG TERM GOAL #2   Title Pt will decrease 5TSTS by at least 8 seconds in order to demonstrate clinically significant improvement in LE strength.    Baseline 02/02/2021= 28.22 sec with BUE Support 12/14: 23.1 seconds; 04/29/2021=Unable to test today due to patient became nauseous with 6 min walk test and attempted TUG- unable to continue- will reassess next 1-2 visits; 06/08/2021= 25.1 sec with min BUE support; 07/20/2021= 17.75 with min BUE support    Time 12    Period Weeks    Status Achieved    Target Date 07/22/21      PT LONG TERM GOAL #3   Title Pt will decrease TUG to below 19 seconds/decrease in order to demonstrate decreased fall risk.    Baseline 02/02/2021= 27.30 sec  without AD 12/14: 23 seconds; 04/29/2021- Unable to assess as patient became very nauseated upon standing and request to stop treatment. 06/08/2021= 19.10 sec without an AD; 07/20/2021= 15.68 sec without UE Support    Time 12    Period Weeks    Status Achieved    Target Date 07/22/21      PT LONG TERM GOAL #4   Title Pt will increase 6MWT by at least 52m(161f in order to demonstrate clinically significant improvement in cardiopulmonary endurance and community ambulation    Baseline 02/02/2021= 83 feet in 1 min 10 sec wihtout an AD 12/14: 200 ft in  80m51mtes; 04/29/2021=Patient ambulated approx 60 feet yet had to stop due onset of nausea and unable to complete today; 07/15/2021= 330 feet in 2 min 25 sec without AD    Time 12    Period Weeks    Status Partially Met    Target Date 07/22/21      PT LONG TERM GOAL #5   Title Pt will increase 10MWT by at least 0.2 m/s in order to demonstrate clinically significant improvement in community ambulation.    Baseline 02/02/2021= 0.42 m/s 12/14: 0.49 m/s; 04/29/2021=Patient unable to test today secondary to being nauseated. 06/08/2021= 0.62 m/s; 07/20/2021= 0.87 m/s    Time 12    Period Weeks    Status Achieved    Target Date 07/22/21      Additional Long Term Goals   Additional Long Term Goals Yes      PT LONG TERM GOAL #6   Title Pt will decrease 5TSTS by at least 3 seconds in order to demonstrate clinically significant improvement in LE strength.    Baseline 07/20/2021= 17.75 sec with Minimal BUE Support    Time 12    Period Weeks    Status New    Target Date 10/12/21              INTERVENTIONS:    Therapeutic Exercises: for improved LE strength, balance, and mobility goals.   2.5 # AW donned for all standing activities this session  -Forward step to 6 inch step, no UE support    *seated recovery due to exertional dyspnea  -Side step up/over over hurdle x 10 ea LE with UE support    *seated recovery due to exertional dyspnea  -Standing  heel raises-x 15 reps BLE    *seated recovery due to exertional dyspnea  Standing Hip ext x 10 reps alt LE  -some back stiffness noted at start of movement     *seated recovery due to exertional dyspnea  Standing hip abd x 10 reps alt LE     *seated recovery due to exertional dyspnea  -Standing Hip march-x 10 reps alt LE  *seated recovery due to exertional dyspnea  STS and ambulaiton circuit  -5 STS followed by short rest and 160 feet of ambulation without AD, some fatigue noted with ambulation speed, no LOB  -Pt reports stomach acting up a little bit following this prolonged walk  Prolonged seated recovery following due to dispnea  LAQ with 2.5 # AW  2 x 10 on ea  - fatigue noted on L LE only with this activity, reports fatigue/ discomfort in the quad muscle area   Pt required occasional rest breaks due fatigue, PT was quick to ask when pt appeared to be fatiguing in order to prevent excessive fatigue.    Plan - 08/31/21 1406     Clinical Impression Statement Patient continues to progress with lower extremity strengthening and endurance exercises.  Patient did have significant fatigue following prolonged ambulation this session but with prolonged recovery.  Patient was able to continue with exercises.  Patient does demonstrate significant improvement throughout his therapy sessions as demonstrated by improved ambulatory capacity as well as standing exercise tolerance.  Patient will continue to benefit from skilled physical therapy in order to improve his lower extremity strength, balance, mobility, and quality of life.    Personal Factors and Comorbidities Comorbidity 3+    Comorbidities DM, Liver transplant, ESRD, CAD, feeding tube    Examination-Activity Limitations Carry;Lift;Reach Overhead;Squat;Stairs;Stand;Transfers;Toileting    Examination-Participation Restrictions Cleaning;Community Activity;Driving;Medication Management;Meal Prep;Yard Work    [employer]chant [employer] officer  Evolving/Moderate complexity    Rehab Potential Fair    PT Frequency 2x / week    PT Duration 12 weeks    PT Treatment/Interventions ADLs/Self Care Home Management;Cryotherapy;Moist Heat;DME Instruction;Gait training;Stair training;Functional mobility training;Therapeutic activities;Therapeutic exercise;Balance training;Neuromuscular re-education;Patient/family education;Wheelchair mobility training;Manual techniques;Passive range of motion;Dry needling;Energy conservation;Joint Manipulations    PT Next Visit Plan Progressive Therapeutic exercises, Balance training, transfer/gait training    PT Home Exercise Plan No changes or updates today.    Consulted and Agree with Plan of Care Patient             Patient will benefit from skilled therapeutic intervention in order to improve the following deficits and impairments:  Abnormal gait, Cardiopulmonary status limiting activity, Decreased activity tolerance, Decreased balance, Decreased coordination, Decreased endurance, Decreased mobility, Decreased range of motion, Decreased strength, Difficulty walking, Hypomobility, Impaired flexibility, Impaired UE functional use  Visit Diagnosis: Abnormality of gait and mobility  Difficulty in walking, not elsewhere classified  Muscle weakness (generalized)  Other abnormalities of gait and mobility     Problem List Patient Active Problem List   Diagnosis Date Noted   Pulmonary embolism ([place]) 08/08/2021   CAP (community acquired pneumonia) 08/08/2021   Protein calorie malnutrition ([place]) 08/08/2021   Hypokalemia 08/08/2021   Pancreatic cyst    Verbal auditory hallucination    Early satiety    Feeding tube dysfunction    Generalized weakness 11/26/2020   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[place]) 11/26/2020   Recurrent falls 11/26/2020   Syncope 10/15/2020   Uremia 10/10/2020   ESRD on hemodialysis ([place]) 10/10/2020   Hyperkalemia 10/10/2020   Elevated troponin 10/10/2020   GERD  (gastroesophageal reflux disease) 10/10/2020   CAD (coronary artery disease) 10/10/2020   G  tube feedings ([place]) 10/10/2020   Diarrhea 10/10/2020   Diabetes mellitus ([place]) 10/10/2020   Weakness    ACS (acute coronary syndrome) [facility])    Liver transplant recipient [facility])    Anemia of chronic disease    Type 2 diabetes mellitus with diabetic neuropathy, with long-term current use of insulin ([place])    ESRD (end stage renal disease) ([place]) 08/19/2020   History of anemia due to chronic kidney disease 08/19/2020   Gait abnormality 10/17/2019   History of fall within past 90 days 10/17/2019   Visual hallucination 10/17/2019   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

## 2021-09-02 ENCOUNTER — Ambulatory Visit: Payer: Medicare Other

## 2021-09-02 DIAGNOSIS — R2681 Unsteadiness on feet: Secondary | ICD-10-CM

## 2021-09-02 DIAGNOSIS — R262 Difficulty in walking, not elsewhere classified: Secondary | ICD-10-CM

## 2021-09-02 DIAGNOSIS — R269 Unspecified abnormalities of gait and mobility: Secondary | ICD-10-CM | POA: Diagnosis not present

## 2021-09-02 DIAGNOSIS — R278 Other lack of coordination: Secondary | ICD-10-CM

## 2021-09-02 DIAGNOSIS — R296 Repeated falls: Secondary | ICD-10-CM

## 2021-09-02 DIAGNOSIS — R2689 Other abnormalities of gait and mobility: Secondary | ICD-10-CM

## 2021-09-02 DIAGNOSIS — M6281 Muscle weakness (generalized): Secondary | ICD-10-CM

## 2021-09-02 NOTE — Therapy (Signed)
[place] MAIN [facility] SERVICES [address] [place], [place], [address] Phone: [phone]   Fax:  [phone]  Physical Therapy Treatment  Patient Details  Name: Alex Hampton MRN: [id number] Date of Birth: 10/18/1955 No data recorded  Encounter Date: 09/02/2021   PT End of Session - 09/02/21 1026     Visit Number 39    Number of Visits 40    Date for PT Re-Evaluation 10/12/21    Authorization Type Medicare/BCBS; PN 03/23/21    Authorization Time Period Initial Cert= 25/63/8937- 34/28/7681; Recert 1/57/2620-3/55/9741: Recert 6/38/4536- 07/15/8030    Progress Note Due on Visit 40    PT Start Time 0929    PT Stop Time 1010    PT Time Calculation (min) 41 min    Equipment Utilized During Treatment Gait belt    Activity Tolerance Patient limited by fatigue;Patient tolerated treatment well;No increased pain    Behavior During Therapy WFL for tasks assessed/performed             Past Medical History:  Diagnosis Date   Depression    History of cardiac cath    History of heart artery stent    Hyperlipidemia    Hypertension    MI (myocardial infarction) ([place])    Renal disorder     Past Surgical History:  Procedure Laterality Date   IR GJ TUBE CHANGE  11/22/2020   IR [place] TUBE CHANGE  11/26/2020   IR [place] GASTRO/COLONIC TUBE PERCUT W/FLUORO  10/14/2020   LEFT HEART CATH AND CORONARY ANGIOGRAPHY N/A 08/19/2020   Procedure: LEFT HEART CATH AND CORONARY ANGIOGRAPHY;  Surgeon: Corey Skains, MD;  Location: [place];  Service: Cardiovascular;  Laterality: N/A;   LIVER TRANSPLANT      There were no vitals filed for this visit.   Subjective Assessment - 09/02/21 0935     Subjective Patient reports stomach is a little queasy today but otherwise doing okay    Pertinent History Patient is a 66 year old male with recent referral for abnomality of gait and recent liver transplant in November 2021. He has past medical history  significant for Liver transplant (02/18/2020), CKD, End stage renal disease- On hemodialyis T/TH/Sat., Anemia, DM with neuropathy, Non-stemi, Coronary artery disease, Feeding tube with poor appetite, PE now on eliquis    Patient Stated Goals I want to return to my normal self- Independent with all mobility, not fall, with improved overall stamina.    Currently in Pain? No/denies            INTERVENTIONS:    Therapeutic Exercises: for improved LE strength, balance, and mobility goals.   Interval Nustep- Seat level 12/UE level 12 2 min 30 sec at L0 30 sec at L2 1 min 30 sec at L0  30 sec at L3 1 min at L0 RPE= 4/10  Matrix cable system - ham curls at 7.5 lb x 10 reps (Patient reports as hard)  160 feet of ambulation- without AD- 4/10 on RPE Standing step tap onto green therapad x 10 reps alt LE (mild unsteadiness)  -120 feet of ambulation- without AD- 4/10 on RPE -Standing hip flex/abd up/over PVC x 10 reps each. (Rates as difficult)  -80 feet of ambulation without AD- 4/10 on RPE - Sit to stand x 10 reps with min BUE Support  - 40 feet of ambulation without AD-  *seated recovery due to exertional dyspnea (between each activity)  Pt required occasional rest breaks due  fatigue, PT was quick to ask when pt appeared to be fatiguing in order to prevent excessive fatigue.  Education provided throughout session via VC/TC and demonstration to facilitate movement at target joints and correct muscle activation for all testing and exercises performed.      Pt required occasional rest breaks due fatigue, PT was quick to ask when pt appeared to be fatiguing in order to prevent excessive fatigue.     Patient fatigued and requested to end due to fatigue.                            PT Education - 09/02/21 1026     Education Details Exercise technique    Person(s) Educated Patient    Methods Explanation;Demonstration;Tactile cues;Verbal cues    Comprehension  Verbalized understanding;Returned demonstration;Verbal cues required;Tactile cues required;Need further instruction              PT Short Term Goals - 04/29/21 1058       PT SHORT TERM GOAL #1   Title Pt will be independent with HEP in order to improve strength and balance in order to decrease fall risk and improve function at home and work.    Baseline 02/02/2021: Patient reports limited HEP in place currently from [facility] agency 12/14 compliance; 1/20/2023Patient states no questions regarding his current exercise regimen and states he is compliant.    Time 6    Period Weeks    Status Achieved    Target Date 03/16/21               PT Long Term Goals - 07/20/21 1526       PT LONG TERM GOAL #1   Title Pt will improve FOTO to target score of 50 to display perceived improvements in ability to complete ADL's.    Baseline 02/02/2021= 43 12/143: 51%; 04/29/2021= 53%    Time 12    Period Weeks    Status Achieved      PT LONG TERM GOAL #2   Title Pt will decrease 5TSTS by at least 8 seconds in order to demonstrate clinically significant improvement in LE strength.    Baseline 02/02/2021= 28.22 sec with BUE Support 12/14: 23.1 seconds; 04/29/2021=Unable to test today due to patient became nauseous with 6 min walk test and attempted TUG- unable to continue- will reassess next 1-2 visits; 06/08/2021= 25.1 sec with min BUE support; 07/20/2021= 17.75 with min BUE support    Time 12    Period Weeks    Status Achieved    Target Date 07/22/21      PT LONG TERM GOAL #3   Title Pt will decrease TUG to below 19 seconds/decrease in order to demonstrate decreased fall risk.    Baseline 02/02/2021= 27.30 sec without AD 12/14: 23 seconds; 04/29/2021- Unable to assess as patient became very nauseated upon standing and request to stop treatment. 06/08/2021= 19.10 sec without an AD; 07/20/2021= 15.68 sec without UE Support    Time 12    Period Weeks    Status Achieved    Target Date 07/22/21      PT LONG  TERM GOAL #4   Title Pt will increase 6MWT by at least 7m(1623f in order to demonstrate clinically significant improvement in cardiopulmonary endurance and community ambulation    Baseline 02/02/2021= 83 feet in 1 min 10 sec wihtout an AD 12/14: 200 ft in  5m55mtes; 04/29/2021=Patient ambulated approx 60 feet yet had to  stop due onset of nausea and unable to complete today; 07/15/2021= 330 feet in 2 min 25 sec without AD    Time 12    Period Weeks    Status Partially Met    Target Date 07/22/21      PT LONG TERM GOAL #5   Title Pt will increase 10MWT by at least 0.2 m/s in order to demonstrate clinically significant improvement in community ambulation.    Baseline 02/02/2021= 0.42 m/s 12/14: 0.49 m/s; 04/29/2021=Patient unable to test today secondary to being nauseated. 06/08/2021= 0.62 m/s; 07/20/2021= 0.87 m/s    Time 12    Period Weeks    Status Achieved    Target Date 07/22/21      Additional Long Term Goals   Additional Long Term Goals Yes      PT LONG TERM GOAL #6   Title Pt will decrease 5TSTS by at least 3 seconds in order to demonstrate clinically significant improvement in LE strength.    Baseline 07/20/2021= 17.75 sec with Minimal BUE Support    Time 12    Period Weeks    Status New    Target Date 10/12/21                   Plan - 09/02/21 1024     Clinical Impression Statement Patient presented today with good motivation and participated well with interval strength training. He was able to correctly utilize the RPE scale and take short breaks as needed. He was able to improve with overall endurance with modified distances of ambulation combined with strengthening exercise. He reported no increase in nausea during visit. Patient will continue to benefit from skilled physical therapy in order to improve his lower extremity strength, balance, mobility, and quality of life.    Personal Factors and Comorbidities Comorbidity 3+    Comorbidities DM, Liver transplant, ESRD,  CAD, feeding tube    Examination-Activity Limitations Carry;Lift;Reach Overhead;Squat;Stairs;Stand;Transfers;Toileting    Examination-Participation Restrictions Cleaning;Community Activity;Driving;Medication Management;Meal Prep;Yard Work    Merchant [employer] officer Evolving/Moderate complexity    Rehab Potential Fair    PT Frequency 2x / week    PT Duration 12 weeks    PT Treatment/Interventions ADLs/Self Care Home Management;Cryotherapy;Moist Heat;DME Instruction;Gait training;Stair training;Functional mobility training;Therapeutic activities;Therapeutic exercise;Balance training;Neuromuscular re-education;Patient/family education;Wheelchair mobility training;Manual techniques;Passive range of motion;Dry needling;Energy conservation;Joint Manipulations    PT Next Visit Plan Progressive Therapeutic exercises, Balance training, transfer/gait training    PT Home Exercise Plan No changes or updates today.    Consulted and Agree with Plan of Care Patient             Patient will benefit from skilled therapeutic intervention in order to improve the following deficits and impairments:  Abnormal gait, Cardiopulmonary status limiting activity, Decreased activity tolerance, Decreased balance, Decreased coordination, Decreased endurance, Decreased mobility, Decreased range of motion, Decreased strength, Difficulty walking, Hypomobility, Impaired flexibility, Impaired UE functional use  Visit Diagnosis: Abnormality of gait and mobility  Difficulty in walking, not elsewhere classified  Muscle weakness (generalized)  Other abnormalities of gait and mobility  Other lack of coordination  Unsteadiness on feet  Repeated falls     Problem List Patient Active Problem List   Diagnosis Date Noted   Pulmonary embolism ([place]) 08/08/2021   CAP (community acquired pneumonia) 08/08/2021   Protein calorie malnutrition ([place]) 08/08/2021   Hypokalemia 08/08/2021   Pancreatic cyst    Verbal  auditory hallucination    Early satiety    Feeding tube dysfunction  Generalized weakness 11/26/2020   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[place]) 11/26/2020   Recurrent falls 11/26/2020   Syncope 10/15/2020   Uremia 10/10/2020   ESRD on hemodialysis ([place]) 10/10/2020   Hyperkalemia 10/10/2020   Elevated troponin 10/10/2020   GERD (gastroesophageal reflux disease) 10/10/2020   CAD (coronary artery disease) 10/10/2020   G tube feedings ([place]) 10/10/2020   Diarrhea 10/10/2020   Diabetes mellitus ([place]) 10/10/2020   Weakness    ACS (acute coronary syndrome) ([place])    Liver transplant recipient [facility])    Anemia of chronic disease    Type 2 diabetes mellitus with diabetic neuropathy, with long-term current use of insulin ([place])    ESRD (end stage renal disease) ([place]) 08/19/2020   History of anemia due to chronic kidney disease 08/19/2020   Gait abnormality 10/17/2019   History of fall within past 90 days 10/17/2019   Visual hallucination 10/17/2019   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

## 2021-09-07 ENCOUNTER — Ambulatory Visit: Payer: Medicare Other

## 2021-09-09 ENCOUNTER — Ambulatory Visit: Payer: Medicare Other | Attending: Gastroenterology

## 2021-09-09 DIAGNOSIS — R269 Unspecified abnormalities of gait and mobility: Secondary | ICD-10-CM | POA: Diagnosis present

## 2021-09-09 DIAGNOSIS — R2689 Other abnormalities of gait and mobility: Secondary | ICD-10-CM | POA: Insufficient documentation

## 2021-09-09 DIAGNOSIS — R296 Repeated falls: Secondary | ICD-10-CM | POA: Diagnosis present

## 2021-09-09 DIAGNOSIS — M6281 Muscle weakness (generalized): Secondary | ICD-10-CM | POA: Insufficient documentation

## 2021-09-09 DIAGNOSIS — R278 Other lack of coordination: Secondary | ICD-10-CM | POA: Diagnosis present

## 2021-09-09 DIAGNOSIS — R2681 Unsteadiness on feet: Secondary | ICD-10-CM | POA: Insufficient documentation

## 2021-09-09 DIAGNOSIS — R262 Difficulty in walking, not elsewhere classified: Secondary | ICD-10-CM | POA: Insufficient documentation

## 2021-09-09 NOTE — Therapy (Signed)
[place] MAIN [facility] SERVICES [address] [place], [place], [address] Phone: [phone]   Fax:  [phone]  Physical Therapy Treatment/Physical Therapy Progress Note   Dates of reporting period  07/20/2021  to   09/09/2021  Patient Details  Name: Alex Hampton MRN: [id number] Date of Birth: 10/07/1955 No data recorded  Encounter Date: 09/09/2021   PT End of Session - 09/09/21 1137     Visit Number 40    Number of Visits 24    Date for PT Re-Evaluation 10/12/21    Authorization Type Medicare/BCBS; PN 03/23/21    Authorization Time Period Initial Cert= 43/15/4008- 67/61/9509; Recert 07/04/7122-5/80/9983: Recert 3/82/5053- 12/15/6732    Progress Note Due on Visit 50    PT Start Time 1015    PT Stop Time 1048    PT Time Calculation (min) 33 min    Equipment Utilized During Treatment Gait belt    Activity Tolerance Patient limited by fatigue;Patient tolerated treatment well;No increased pain    Behavior During Therapy WFL for tasks assessed/performed             Past Medical History:  Diagnosis Date   Depression    History of cardiac cath    History of heart artery stent    Hyperlipidemia    Hypertension    MI (myocardial infarction) ([place])    Renal disorder     Past Surgical History:  Procedure Laterality Date   IR [place] TUBE CHANGE  11/22/2020   IR [place] TUBE CHANGE  11/26/2020   IR [place] GASTRO/COLONIC TUBE PERCUT W/FLUORO  10/14/2020   LEFT HEART CATH AND CORONARY ANGIOGRAPHY N/A 08/19/2020   Procedure: LEFT HEART CATH AND CORONARY ANGIOGRAPHY;  Surgeon: Corey Skains, MD;  Location: [place] [facility];  Service: Cardiovascular;  Laterality: N/A;   LIVER TRANSPLANT      There were no vitals filed for this visit.   Subjective Assessment - 09/09/21 1139     Subjective Patient reports increased stomach upset overall today and not feeling well- but agreeable to see what he can do.    Pertinent History Patient is a 66 year old male  with recent referral for abnomality of gait and recent liver transplant in November 2021. He has past medical history significant for Liver transplant (02/18/2020), CKD, End stage renal disease- On hemodialyis T/TH/Sat., Anemia, DM with neuropathy, Non-stemi, Coronary artery disease, Feeding tube with poor appetite, PE now on eliquis    Patient Stated Goals I want to return to my normal self- Independent with all mobility, not fall, with improved overall stamina.    Currently in Pain? No/denies            Reassessed goals for progress note  FOTO= 53 (unchanged from last assessment- yet goal already met)   5 time Sit to Stand= minimal UE Support  6 min walk test= 350 feet in 3 min 45 sec (improving) - Stopped due to undo fatigue.   Therapeutic exercises:   Step tap (seated) with 3lb AW onto 6" block x 10 BLE *seated recovery due to exertional dyspnea - HR= 78 bpm  Knee ext 3 lb BLE x 10 reps  Heel Raises 3 lb X 10 reps (using toe box on 1/2 foam)   Standing side stepping in // bars x length of bars x 2 rounds prior to patient requesting to terminate session secondary to fatigue.   Education provided throughout session via VC/TC and demonstration to facilitate movement at  target joints and correct muscle activation for all testing and exercises performed.                            PT Education - 09/09/21 1140     Education Details Exercise technique    Person(s) Educated Patient    Methods Explanation;Demonstration;Tactile cues;Verbal cues    Comprehension Verbalized understanding;Returned demonstration;Verbal cues required;Tactile cues required;Need further instruction              PT Short Term Goals - 04/29/21 1058       PT SHORT TERM GOAL #1   Title Pt will be independent with HEP in order to improve strength and balance in order to decrease fall risk and improve function at home and work.    Baseline 02/02/2021: Patient reports limited HEP  in place currently from [facility] agency 12/14 compliance; 1/20/2023Patient states no questions regarding his current exercise regimen and states he is compliant.    Time 6    Period Weeks    Status Achieved    Target Date 03/16/21               PT Long Term Goals - 09/09/21 1022       PT LONG TERM GOAL #1   Title Pt will improve FOTO to target score of 50 to display perceived improvements in ability to complete ADL's.    Baseline 02/02/2021= 43 12/143: 51%; 04/29/2021= 53%; 09/09/2021= 53    Time 12    Period Weeks    Status Achieved      PT LONG TERM GOAL #2   Title Pt will decrease 5TSTS by at least 8 seconds in order to demonstrate clinically significant improvement in LE strength.    Baseline 02/02/2021= 28.22 sec with BUE Support 12/14: 23.1 seconds; 04/29/2021=Unable to test today due to patient became nauseous with 6 min walk test and attempted TUG- unable to continue- will reassess next 1-2 visits; 06/08/2021= 25.1 sec with min BUE support; 07/20/2021= 17.75 with min BUE support    Time 12    Period Weeks    Status Achieved    Target Date 07/22/21      PT LONG TERM GOAL #3   Title Pt will decrease TUG to below 19 seconds/decrease in order to demonstrate decreased fall risk.    Baseline 02/02/2021= 27.30 sec without AD 12/14: 23 seconds; 04/29/2021- Unable to assess as patient became very nauseated upon standing and request to stop treatment. 06/08/2021= 19.10 sec without an AD; 07/20/2021= 15.68 sec without UE Support    Time 12    Period Weeks    Status Achieved    Target Date 07/22/21      PT LONG TERM GOAL #4   Title Pt will increase 6MWT by at least 34m(1684f in order to demonstrate clinically significant improvement in cardiopulmonary endurance and community ambulation    Baseline 02/02/2021= 83 feet in 1 min 10 sec wihtout an AD 12/14: 200 ft in  49m24mtes; 04/29/2021=Patient ambulated approx 60 feet yet had to stop due onset of nausea and unable to complete today; 07/15/2021= 330  feet in 2 min 25 sec without AD.  09/09/2021= 350 feet in 3 min 45 sec.    Time 12    Period Weeks    Status Partially Met    Target Date 07/22/21      PT LONG TERM GOAL #5   Title Pt will increase 10MWT by at least  0.2 m/s in order to demonstrate clinically significant improvement in community ambulation.    Baseline 02/02/2021= 0.42 m/s 12/14: 0.49 m/s; 04/29/2021=Patient unable to test today secondary to being nauseated. 06/08/2021= 0.62 m/s; 07/20/2021= 0.87 m/s    Time 12    Period Weeks    Status Achieved    Target Date 07/22/21      PT LONG TERM GOAL #6   Title Pt will decrease 5TSTS by at least 3 seconds in order to demonstrate clinically significant improvement in LE strength.    Baseline 07/20/2021= 17.75 sec with Minimal BUE Support; 09/09/2021= 17.50 sec with very minimal UE Support.    Time 12    Period Weeks    Status New    Target Date 10/12/21                   Plan - 09/09/21 1136     Clinical Impression Statement Patient presents with good motivation for today's 40th visit progress note despite report of stomach upset. He was able to continue to demo some overall progress with slight improvement in gait distance/time on feet with 6 min walk indicating an improvement in his overall functional endurance. He also slightly improved with 5x STS test as well today. He fatigued out with testing and some LE strengthening and continues to be overall limited with functional endurance and LE weakness. Patient's condition has the potential to improve in response to therapy. Maximum improvement is yet to be obtained. The anticipated improvement is attainable and reasonable in a generally predictable time. Patient will continue to benefit from skilled physical therapy intervention in order to improve his strength, mobility, and overall quality of life    Personal Factors and Comorbidities Comorbidity 3+    Comorbidities DM, Liver transplant, ESRD, CAD, feeding tube     Examination-Activity Limitations Carry;Lift;Reach Overhead;Squat;Stairs;Stand;Transfers;Toileting    Examination-Participation Restrictions Cleaning;Community Activity;Driving;Medication Management;Meal Prep;Yard Work    [employer] Evolving/Moderate complexity    Rehab Potential Fair    PT Frequency 2x / week    PT Duration 12 weeks    PT Treatment/Interventions ADLs/Self Care Home Management;Cryotherapy;Moist Heat;DME Instruction;Gait training;Stair training;Functional mobility training;Therapeutic activities;Therapeutic exercise;Balance training;Neuromuscular re-education;Patient/family education;Wheelchair mobility training;Manual techniques;Passive range of motion;Dry needling;Energy conservation;Joint Manipulations    PT Next Visit Plan Progressive Therapeutic exercises, Balance training, transfer/gait training    PT Home Exercise Plan No changes or updates today.    Consulted and Agree with Plan of Care Patient             Patient will benefit from skilled therapeutic intervention in order to improve the following deficits and impairments:  Abnormal gait, Cardiopulmonary status limiting activity, Decreased activity tolerance, Decreased balance, Decreased coordination, Decreased endurance, Decreased mobility, Decreased range of motion, Decreased strength, Difficulty walking, Hypomobility, Impaired flexibility, Impaired UE functional use  Visit Diagnosis: Abnormality of gait and mobility  Difficulty in walking, not elsewhere classified  Muscle weakness (generalized)  Other abnormalities of gait and mobility  Other lack of coordination  Unsteadiness on feet     Problem List Patient Active Problem List   Diagnosis Date Noted   Pulmonary embolism ([place]) 08/08/2021   CAP (community acquired pneumonia) 08/08/2021   Protein calorie malnutrition ([place]) 08/08/2021   Hypokalemia 08/08/2021   Pancreatic cyst    Verbal auditory hallucination    Early satiety     Feeding tube dysfunction    Generalized weakness 11/26/2020   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[place]) 11/26/2020   Recurrent falls 11/26/2020   Syncope  10/15/2020   Uremia 10/10/2020   ESRD on hemodialysis ([place]) 10/10/2020   Hyperkalemia 10/10/2020   Elevated troponin 10/10/2020   GERD (gastroesophageal reflux disease) 10/10/2020   CAD (coronary artery disease) 10/10/2020   G tube feedings ([place]) 10/10/2020   Diarrhea 10/10/2020   Diabetes mellitus ([place]) 10/10/2020   Weakness    ACS (acute coronary syndrome) ([place])    Liver transplant recipient [facility])    Anemia of chronic disease    Type 2 diabetes mellitus with diabetic neuropathy, with long-term current use of insulin ([place])    ESRD (end stage renal disease) ([place]) 08/19/2020   History of anemia due to chronic kidney disease 08/19/2020   Gait abnormality 10/17/2019   History of fall within past 90 days 10/17/2019   Visual hallucination 10/17/2019   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

## 2021-09-14 ENCOUNTER — Ambulatory Visit: Payer: Medicare Other

## 2021-09-14 DIAGNOSIS — R269 Unspecified abnormalities of gait and mobility: Secondary | ICD-10-CM

## 2021-09-14 DIAGNOSIS — R2681 Unsteadiness on feet: Secondary | ICD-10-CM

## 2021-09-14 DIAGNOSIS — R2689 Other abnormalities of gait and mobility: Secondary | ICD-10-CM

## 2021-09-14 DIAGNOSIS — R262 Difficulty in walking, not elsewhere classified: Secondary | ICD-10-CM

## 2021-09-14 DIAGNOSIS — M6281 Muscle weakness (generalized): Secondary | ICD-10-CM

## 2021-09-14 DIAGNOSIS — R278 Other lack of coordination: Secondary | ICD-10-CM

## 2021-09-14 NOTE — Therapy (Signed)
[place] MAIN [facility] SERVICES [address] [place], [place], [address] Phone: [phone]   Fax:  [phone]  Physical Therapy Treatment  Patient Details  Name: Alex Hampton MRN: [id number] Date of Birth: 09/26/1955 No data recorded  Encounter Date: 09/14/2021   PT End of Session - 09/14/21 0828     Visit Number 41    Number of Visits 43    Date for PT Re-Evaluation 10/12/21    Authorization Type Medicare/BCBS; PN 03/23/21    Authorization Time Period Initial Cert= 66/59/9357- 01/77/9390; Recert 3/00/9233-0/10/6224: Recert 3/33/5456- 05/16/6387    Progress Note Due on Visit 50    PT Start Time 1345    PT Stop Time 1426    PT Time Calculation (min) 41 min    Equipment Utilized During Treatment Gait belt    Activity Tolerance Patient limited by fatigue;Patient tolerated treatment well;No increased pain    Behavior During Therapy WFL for tasks assessed/performed             Past Medical History:  Diagnosis Date   Depression    History of cardiac cath    History of heart artery stent    Hyperlipidemia    Hypertension    MI (myocardial infarction) ([place])    Renal disorder     Past Surgical History:  Procedure Laterality Date   IR [place]J TUBE CHANGE  11/22/2020   IR [place] TUBE CHANGE  11/26/2020   IR [place] GASTRO/COLONIC TUBE PERCUT W/FLUORO  10/14/2020   LEFT HEART CATH AND CORONARY ANGIOGRAPHY N/A 08/19/2020   Procedure: LEFT HEART CATH AND CORONARY ANGIOGRAPHY;  Surgeon: Corey Skains, MD;  Location: [place] [facility];  Service: Cardiovascular;  Laterality: N/A;   LIVER TRANSPLANT      There were no vitals filed for this visit.   Subjective Assessment - 09/14/21 1350     Subjective Patient reports continued  stomach issues. Denies any pain or falls.    Pertinent History Patient is a 66 year old male with recent referral for abnomality of gait and recent liver transplant in November 2021. He has past medical history significant  for Liver transplant (02/18/2020), CKD, End stage renal disease- On hemodialyis T/TH/Sat., Anemia, DM with neuropathy, Non-stemi, Coronary artery disease, Feeding tube with poor appetite, PE now on eliquis    Patient Stated Goals I want to return to my normal self- Independent with all mobility, not fall, with improved overall stamina.    Currently in Pain? No/denies                    INTERVENTIONS:   Therex:   Circuit style workout for overall strength and endurance Round 1) 1) ambulation in clinic approx 50 sec  2) Tandem stand- hold 30 sec x 2 trials each LE *seated recovery due to exertional dyspnea   Round 2)  1) ambulation in clinic approx 50 sec 2) Standing posture - Rows followed by shoulder ext 2 sets x 12 reps each *seated recovery due to exertional dyspnea   Round 3)  1) Ambulation in clinic approx 50 sec 2) Bicep curl with 4 lb x 12 reps  *seated recovery due to exertional dyspnea   Round 4)  1) Ambulation in clinic approx 55 sec  2) Dynamic step and reach overhead (note- limited left shoulder mobility)  *seated recovery due to exertional dyspnea  Round 5)  1) Ambulation in clinic approx 1 min  2) Tricep (chair press up) -  x 8 rep  Education provided throughout session via VC/TC and demonstration to facilitate movement at target joints and correct muscle activation for all testing and exercises performed. Rationale for Evaluation and Treatment Rehabilitation                 PT Education - 09/15/21 0827     Education Details Exercise technique    Person(s) Educated Patient    Methods Explanation;Demonstration;Tactile cues;Verbal cues    Comprehension Verbalized understanding;Returned demonstration;Verbal cues required;Tactile cues required;Need further instruction              PT Short Term Goals - 04/29/21 1058       PT SHORT TERM GOAL #1   Title Pt will be independent with HEP in order to improve strength and balance in order  to decrease fall risk and improve function at home and work.    Baseline 02/02/2021: Patient reports limited HEP in place currently from [facility] agency 12/14 compliance; 1/20/2023Patient states no questions regarding his current exercise regimen and states he is compliant.    Time 6    Period Weeks    Status Achieved    Target Date 03/16/21               PT Long Term Goals - 09/09/21 1022       PT LONG TERM GOAL #1   Title Pt will improve FOTO to target score of 50 to display perceived improvements in ability to complete ADL's.    Baseline 02/02/2021= 43 12/143: 51%; 04/29/2021= 53%; 09/09/2021= 53    Time 12    Period Weeks    Status Achieved      PT LONG TERM GOAL #2   Title Pt will decrease 5TSTS by at least 8 seconds in order to demonstrate clinically significant improvement in LE strength.    Baseline 02/02/2021= 28.22 sec with BUE Support 12/14: 23.1 seconds; 04/29/2021=Unable to test today due to patient became nauseous with 6 min walk test and attempted TUG- unable to continue- will reassess next 1-2 visits; 06/08/2021= 25.1 sec with min BUE support; 07/20/2021= 17.75 with min BUE support    Time 12    Period Weeks    Status Achieved    Target Date 07/22/21      PT LONG TERM GOAL #3   Title Pt will decrease TUG to below 19 seconds/decrease in order to demonstrate decreased fall risk.    Baseline 02/02/2021= 27.30 sec without AD 12/14: 23 seconds; 04/29/2021- Unable to assess as patient became very nauseated upon standing and request to stop treatment. 06/08/2021= 19.10 sec without an AD; 07/20/2021= 15.68 sec without UE Support    Time 12    Period Weeks    Status Achieved    Target Date 07/22/21      PT LONG TERM GOAL #4   Title Pt will increase 6MWT by at least 18m (162ft) in order to demonstrate clinically significant improvement in cardiopulmonary endurance and community ambulation    Baseline 02/02/2021= 83 feet in 1 min 10 sec wihtout an AD 12/14: 200 ft in  101minutes;  04/29/2021=Patient ambulated approx 60 feet yet had to stop due onset of nausea and unable to complete today; 07/15/2021= 330 feet in 2 min 25 sec without AD.  09/09/2021= 350 feet in 3 min 45 sec.    Time 12    Period Weeks    Status Partially Met    Target Date 07/22/21      PT LONG TERM GOAL #  5   Title Pt will increase 10MWT by at least 0.2 m/s in order to demonstrate clinically significant improvement in community ambulation.    Baseline 02/02/2021= 0.42 m/s 12/14: 0.49 m/s; 04/29/2021=Patient unable to test today secondary to being nauseated. 06/08/2021= 0.62 m/s; 07/20/2021= 0.87 m/s    Time 12    Period Weeks    Status Achieved    Target Date 07/22/21      PT LONG TERM GOAL #6   Title Pt will decrease 5TSTS by at least 3 seconds in order to demonstrate clinically significant improvement in LE strength.    Baseline 07/20/2021= 17.75 sec with Minimal BUE Support; 09/09/2021= 17.50 sec with very minimal UE Support.    Time 12    Period Weeks    Status New    Target Date 10/12/21                   Plan - 09/14/21 0828     Clinical Impression Statement Patient presented with good motivation and did not complain of any increased stomach issues today. He was able to progress his standing endurance and overall walking today. He was also able to incorporate more upper body strengthening without any negative effects. He continues to require some recovery time but was able to continue with participate for full session today. Patient will continue to benefit from skilled physical therapy in order to improve his lower extremity strength, balance, mobility, and quality of life.    Personal Factors and Comorbidities Comorbidity 3+    Comorbidities DM, Liver transplant, ESRD, CAD, feeding tube    Examination-Activity Limitations Carry;Lift;Reach Overhead;Squat;Stairs;Stand;Transfers;Toileting    Examination-Participation Restrictions Cleaning;Community Activity;Driving;Medication Management;Meal  Prep;Yard Work    [employer] officer Evolving/Moderate complexity    Rehab Potential Fair    PT Frequency 2x / week    PT Duration 12 weeks    PT Treatment/Interventions ADLs/Self Care Home Management;Cryotherapy;Moist Heat;DME Instruction;Gait training;Stair training;Functional mobility training;Therapeutic activities;Therapeutic exercise;Balance training;Neuromuscular re-education;Patient/family education;Wheelchair mobility training;Manual techniques;Passive range of motion;Dry needling;Energy conservation;Joint Manipulations    PT Next Visit Plan Progressive Therapeutic exercises, Balance training, transfer/gait training    PT Home Exercise Plan No changes or updates today.    Consulted and Agree with Plan of Care Patient             Patient will benefit from skilled therapeutic intervention in order to improve the following deficits and impairments:  Abnormal gait, Cardiopulmonary status limiting activity, Decreased activity tolerance, Decreased balance, Decreased coordination, Decreased endurance, Decreased mobility, Decreased range of motion, Decreased strength, Difficulty walking, Hypomobility, Impaired flexibility, Impaired UE functional use  Visit Diagnosis: Abnormality of gait and mobility  Difficulty in walking, not elsewhere classified  Muscle weakness (generalized)  Other abnormalities of gait and mobility  Other lack of coordination  Unsteadiness on feet     Problem List Patient Active Problem List   Diagnosis Date Noted   Pulmonary embolism ([place]) 08/08/2021   CAP (community acquired pneumonia) 08/08/2021   Protein calorie malnutrition ([place]) 08/08/2021   Hypokalemia 08/08/2021   Pancreatic cyst    Verbal auditory hallucination    Early satiety    Feeding tube dysfunction    Generalized weakness 11/26/2020   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[place]) 11/26/2020   Recurrent falls 11/26/2020   Syncope 10/15/2020   Uremia 10/10/2020   ESRD on  hemodialysis ([place]) 10/10/2020   Hyperkalemia 10/10/2020   Elevated troponin 10/10/2020   GERD (gastroesophageal reflux disease) 10/10/2020   CAD (coronary artery disease) 10/10/2020  G tube feedings ([place]) 10/10/2020   Diarrhea 10/10/2020   Diabetes mellitus ([place]) 10/10/2020   Weakness    ACS (acute coronary syndrome) [facility])    Liver transplant recipient [facility])    Anemia of chronic disease    Type 2 diabetes mellitus with diabetic neuropathy, with long-term current use of insulin ([place])    ESRD (end stage renal disease) ([place]) 08/19/2020   History of anemia due to chronic kidney disease 08/19/2020   Gait abnormality 10/17/2019   History of fall within past 90 days 10/17/2019   Visual hallucination 10/17/2019   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

## 2021-09-16 ENCOUNTER — Ambulatory Visit: Payer: Medicare Other

## 2021-09-19 ENCOUNTER — Encounter: Payer: Self-pay | Admitting: Physical Therapy

## 2021-09-19 ENCOUNTER — Ambulatory Visit: Payer: Medicare Other | Admitting: Physical Therapy

## 2021-09-19 DIAGNOSIS — R262 Difficulty in walking, not elsewhere classified: Secondary | ICD-10-CM

## 2021-09-19 DIAGNOSIS — R2689 Other abnormalities of gait and mobility: Secondary | ICD-10-CM

## 2021-09-19 DIAGNOSIS — R269 Unspecified abnormalities of gait and mobility: Secondary | ICD-10-CM

## 2021-09-19 DIAGNOSIS — M6281 Muscle weakness (generalized): Secondary | ICD-10-CM

## 2021-09-19 NOTE — Therapy (Signed)
[place] MAIN [facility] SERVICES [address] [place], [place], [address] Phone: [phone]   Fax:  [phone]  Physical Therapy Treatment  Patient Details  Name: Alex Hampton MRN: [id number] Date of Birth: 03-Feb-1956 No data recorded  Encounter Date: 09/19/2021   PT End of Session - 09/19/21 1527     Visit Number 42    Number of Visits 30    Date for PT Re-Evaluation 10/12/21    Authorization Type Medicare/BCBS; PN 03/23/21    Authorization Time Period Initial Cert= 91/79/1505- 69/79/4801; Recert 6/55/3748-2/70/7867: Recert 5/44/9201- 0/0/7121    Progress Note Due on Visit 50    PT Start Time 1346    PT Stop Time 1426    PT Time Calculation (min) 40 min    Equipment Utilized During Treatment Gait belt    Activity Tolerance Patient limited by fatigue;Patient tolerated treatment well;No increased pain    Behavior During Therapy WFL for tasks assessed/performed             Past Medical History:  Diagnosis Date   Depression    History of cardiac cath    History of heart artery stent    Hyperlipidemia    Hypertension    MI (myocardial infarction) ([place])    Renal disorder     Past Surgical History:  Procedure Laterality Date   IR [place] TUBE CHANGE  11/22/2020   IR [place] TUBE CHANGE  11/26/2020   IR [place] GASTRO/COLONIC TUBE PERCUT W/FLUORO  10/14/2020   LEFT HEART CATH AND CORONARY ANGIOGRAPHY N/A 08/19/2020   Procedure: LEFT HEART CATH AND CORONARY ANGIOGRAPHY;  Surgeon: Corey Skains, MD;  Location: [place] [facility];  Service: Cardiovascular;  Laterality: N/A;   LIVER TRANSPLANT      There were no vitals filed for this visit.   Subjective Assessment - 09/19/21 1342     Subjective Patient reports continued  stomach issues. Reports his stomach has been bothering him today but he is less weak.    Pertinent History Patient is a 66 year old male with recent referral for abnomality of gait and recent liver transplant in November  2021. He has past medical history significant for Liver transplant (02/18/2020), CKD, End stage renal disease- On hemodialyis T/TH/Sat., Anemia, DM with neuropathy, Non-stemi, Coronary artery disease, Feeding tube with poor appetite, PE now on eliquis    Patient Stated Goals I want to return to my normal self- Independent with all mobility, not fall, with improved overall stamina.            herex:   Circuit style workout for overall strength and endurance Round 1 and 2) 1) ambulation in clinic approx 50 sec  2) standing side step x 6 to each side  -completed this circuit 2 x  -second round directly transitioned to side step with no rest break   Round 3)  1) ambulation in clinic approx 50 sec 2) Standing posture - Rows followed by shoulder ext 2 sets x 12 reps each with RTB  *seated recovery due to exertional dyspnea   Round 4)  1) Ambulation in clinic approx 50 sec 2) Bicep curl with 4 lb x 10 reps  *seated recovery due to exertional dyspnea   Round 5)  1) Ambulation in clinic approx 55 sec  2) tandem balance x 30 seconds ea LE  *seated recovery due to exertional dyspnea   Round 6)  1) Ambulation in clinic approx 1 min  2) HS curls  x 8 ea side standing   Round 7)  1) ambulation x 150 feet approximately 1 min 2) LAQ  with 2.5# AW    Education provided throughout session via VC/TC and demonstration to facilitate movement at target joints and correct muscle activation for all testing and exercises performed. Rationale for Evaluation and Treatment Rehabilitation                               PT Education - 09/19/21 1349     Education Details Exercise technique    Person(s) Educated Patient    Methods Explanation;Demonstration;Tactile cues;Verbal cues    Comprehension Verbalized understanding;Returned demonstration;Verbal cues required              PT Short Term Goals - 04/29/21 1058       PT SHORT TERM GOAL #1   Title Pt will be  independent with HEP in order to improve strength and balance in order to decrease fall risk and improve function at home and work.    Baseline 02/02/2021: Patient reports limited HEP in place currently from [facility] agency 12/14 compliance; 1/20/2023Patient states no questions regarding his current exercise regimen and states he is compliant.    Time 6    Period Weeks    Status Achieved    Target Date 03/16/21               PT Long Term Goals - 09/09/21 1022       PT LONG TERM GOAL #1   Title Pt will improve FOTO to target score of 50 to display perceived improvements in ability to complete ADL's.    Baseline 02/02/2021= 43 12/143: 51%; 04/29/2021= 53%; 09/09/2021= 53    Time 12    Period Weeks    Status Achieved      PT LONG TERM GOAL #2   Title Pt will decrease 5TSTS by at least 8 seconds in order to demonstrate clinically significant improvement in LE strength.    Baseline 02/02/2021= 28.22 sec with BUE Support 12/14: 23.1 seconds; 04/29/2021=Unable to test today due to patient became nauseous with 6 min walk test and attempted TUG- unable to continue- will reassess next 1-2 visits; 06/08/2021= 25.1 sec with min BUE support; 07/20/2021= 17.75 with min BUE support    Time 12    Period Weeks    Status Achieved    Target Date 07/22/21      PT LONG TERM GOAL #3   Title Pt will decrease TUG to below 19 seconds/decrease in order to demonstrate decreased fall risk.    Baseline 02/02/2021= 27.30 sec without AD 12/14: 23 seconds; 04/29/2021- Unable to assess as patient became very nauseated upon standing and request to stop treatment. 06/08/2021= 19.10 sec without an AD; 07/20/2021= 15.68 sec without UE Support    Time 12    Period Weeks    Status Achieved    Target Date 07/22/21      PT LONG TERM GOAL #4   Title Pt will increase 6MWT by at least 52m(1648f in order to demonstrate clinically significant improvement in cardiopulmonary endurance and community ambulation    Baseline 02/02/2021= 83  feet in 1 min 10 sec wihtout an AD 12/14: 200 ft in  61m22mtes; 04/29/2021=Patient ambulated approx 60 feet yet had to stop due onset of nausea and unable to complete today; 07/15/2021= 330 feet in 2 min 25 sec without AD.  09/09/2021= 350 feet in 3 min  45 sec.    Time 12    Period Weeks    Status Partially Met    Target Date 07/22/21      PT LONG TERM GOAL #5   Title Pt will increase 10MWT by at least 0.2 m/s in order to demonstrate clinically significant improvement in community ambulation.    Baseline 02/02/2021= 0.42 m/s 12/14: 0.49 m/s; 04/29/2021=Patient unable to test today secondary to being nauseated. 06/08/2021= 0.62 m/s; 07/20/2021= 0.87 m/s    Time 12    Period Weeks    Status Achieved    Target Date 07/22/21      PT LONG TERM GOAL #6   Title Pt will decrease 5TSTS by at least 3 seconds in order to demonstrate clinically significant improvement in LE strength.    Baseline 07/20/2021= 17.75 sec with Minimal BUE Support; 09/09/2021= 17.50 sec with very minimal UE Support.    Time 12    Period Weeks    Status New    Target Date 10/12/21                   Plan - 09/19/21 1522     Clinical Impression Statement Patient presents with good motivation for completion of physical therapy exercises this session.  Patient required decreased duration of rest breaks due to fatigue but is starting to experience some fatigue toward end of session.  Patient completed approximately 40% more intervals this session compared to previous session indicating continued improvement in endurance and strength.  Patient progressing well with balance, upper extremity and lower extremity strengthening exercises at this time.  Patient will continue to benefit from skilled physical therapy to improve his endurance, mobility, balance, and improve his overall quality of life.    Personal Factors and Comorbidities Comorbidity 3+    Comorbidities DM, Liver transplant, ESRD, CAD, feeding tube    Examination-Activity  Limitations Carry;Lift;Reach Overhead;Squat;Stairs;Stand;Transfers;Toileting    Examination-Participation Restrictions Cleaning;Community Activity;Driving;Medication Management;Meal Prep;Yard Work    [employer] officer Evolving/Moderate complexity    Rehab Potential Fair    PT Frequency 2x / week    PT Duration 12 weeks    PT Treatment/Interventions ADLs/Self Care Home Management;Cryotherapy;Moist Heat;DME Instruction;Gait training;Stair training;Functional mobility training;Therapeutic activities;Therapeutic exercise;Balance training;Neuromuscular re-education;Patient/family education;Wheelchair mobility training;Manual techniques;Passive range of motion;Dry needling;Energy conservation;Joint Manipulations    PT Next Visit Plan Progressive Therapeutic exercises, Balance training, transfer/gait training    PT Home Exercise Plan No changes or updates today.    Consulted and Agree with Plan of Care Patient             Patient will benefit from skilled therapeutic intervention in order to improve the following deficits and impairments:  Abnormal gait, Cardiopulmonary status limiting activity, Decreased activity tolerance, Decreased balance, Decreased coordination, Decreased endurance, Decreased mobility, Decreased range of motion, Decreased strength, Difficulty walking, Hypomobility, Impaired flexibility, Impaired UE functional use  Visit Diagnosis: Abnormality of gait and mobility  Difficulty in walking, not elsewhere classified  Muscle weakness (generalized)  Other abnormalities of gait and mobility     Problem List Patient Active Problem List   Diagnosis Date Noted   Pulmonary embolism ([place]) 08/08/2021   CAP (community acquired pneumonia) 08/08/2021   Protein calorie malnutrition ([place]) 08/08/2021   Hypokalemia 08/08/2021   Pancreatic cyst    Verbal auditory hallucination    Early satiety    Feeding tube dysfunction    Generalized weakness 11/26/2020    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[place]) 11/26/2020   Recurrent falls 11/26/2020   Syncope 10/15/2020  Uremia 10/10/2020   ESRD on hemodialysis ([place]) 10/10/2020   Hyperkalemia 10/10/2020   Elevated troponin 10/10/2020   GERD (gastroesophageal reflux disease) 10/10/2020   CAD (coronary artery disease) 10/10/2020   G tube feedings ([place]) 10/10/2020   Diarrhea 10/10/2020   Diabetes mellitus ([place]) 10/10/2020   Weakness    ACS (acute coronary syndrome) ([place])    Liver transplant recipient [facility]    Anemia of chronic disease    Type 2 diabetes mellitus with diabetic neuropathy, with long-term current use of insulin ([place])    ESRD (end stage renal disease) ([place]) 08/19/2020   History of anemia due to chronic kidney disease 08/19/2020   Gait abnormality 10/17/2019   History of fall within past 90 days 10/17/2019   Visual hallucination 10/17/2019   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

## 2021-09-21 ENCOUNTER — Ambulatory Visit: Payer: Medicare Other | Admitting: Physical Therapy

## 2021-09-21 DIAGNOSIS — R262 Difficulty in walking, not elsewhere classified: Secondary | ICD-10-CM

## 2021-09-21 DIAGNOSIS — R269 Unspecified abnormalities of gait and mobility: Secondary | ICD-10-CM

## 2021-09-21 DIAGNOSIS — R2689 Other abnormalities of gait and mobility: Secondary | ICD-10-CM

## 2021-09-21 DIAGNOSIS — M6281 Muscle weakness (generalized): Secondary | ICD-10-CM

## 2021-09-21 NOTE — Therapy (Addendum)
[place] MAIN [facility] SERVICES [address] [place], [place], [address] Phone: [phone]   Fax:  [phone]  Physical Therapy Treatment  Patient Details  Name: Alex Hampton MRN: [id number] Date of Birth: [birth year] No data recorded  Encounter Date: 09/21/2021   PT End of Session - 09/21/21 1352     Visit Number 43    Number of Visits 37    Date for PT Re-Evaluation 10/12/21    Authorization Type Medicare/BCBS; PN 03/23/21    Authorization Time Period Initial Cert= 67/59/1638- 46/65/9935; Recert 10/09/7791-12/12/90: Recert 07/07/760- 05/16/3333    Progress Note Due on Visit 50    PT Start Time 1345    PT Stop Time 1415    PT Time Calculation (min) 30 min    Equipment Utilized During Treatment Gait belt    Activity Tolerance Patient limited by fatigue;Patient tolerated treatment well;No increased pain    Behavior During Therapy WFL for tasks assessed/performed             Past Medical History:  Diagnosis Date   Depression    History of cardiac cath    History of heart artery stent    Hyperlipidemia    Hypertension    MI (myocardial infarction) ([place])    Renal disorder     Past Surgical History:  Procedure Laterality Date   IR GJ TUBE CHANGE  11/22/2020   IR [place] TUBE CHANGE  11/26/2020   IR [place] GASTRO/COLONIC TUBE PERCUT W/FLUORO  10/14/2020   LEFT HEART CATH AND CORONARY ANGIOGRAPHY N/A 08/19/2020   Procedure: LEFT HEART CATH AND CORONARY ANGIOGRAPHY;  Surgeon: Corey Skains, MD;  Location: [place] [facility];  Service: Cardiovascular;  Laterality: N/A;   LIVER TRANSPLANT      There were no vitals filed for this visit.   Subjective Assessment - 09/21/21 1345     Subjective Patient reports continued  stomach issues. Reports his stomach has been bothering him and he is feeling tired and weak at this time.    Pertinent History Patient is a 66 year old male with recent referral for abnomality of gait and recent liver  transplant in November 2021. He has past medical history significant for Liver transplant (02/18/2020), CKD, End stage renal disease- On hemodialyis T/TH/Sat., Anemia, DM with neuropathy, Non-stemi, Coronary artery disease, Feeding tube with poor appetite, PE now on eliquis    Patient Stated Goals I want to return to my normal self- Independent with all mobility, not fall, with improved overall stamina.              Therex:   Circuit style workout for overall strength and endurance Round 1 and  1) ambulation in clinic approx 50 sec  2) standing side step x 12 to each side with 3 # AW donned bilaterally -seated rest break due to exertional dyspnea   Round 2)  1) ambulation x 150 feet approximately 1 min 2) LAQ  with 3# AW  - seated recovery due to exertional dyspnea   Round 3)  1) ambulation in clinic approx 50 sec 2) Seated row with red Tb 2 x 12  *seated recovery due to exertional dyspnea   Round 4)  1) Ambulation in clinic approx 50 sec - Pt elects to end session early today due to fatigue and some nausea. Pt asked if he needed anything following but declines and reports he would like to just go home and rest.    Education provided throughout  session via VC/TC and demonstration to facilitate movement at target joints and correct muscle activation for all testing and exercises performed. Rationale for Evaluation and Treatment Rehabilitation                                 PT Education - 09/21/21 1351     Education Details Exercise technique    Person(s) Educated Patient    Methods Explanation;Demonstration;Tactile cues;Verbal cues    Comprehension Returned demonstration              PT Short Term Goals - 04/29/21 1058       PT SHORT TERM GOAL #1   Title Pt will be independent with HEP in order to improve strength and balance in order to decrease fall risk and improve function at home and work.    Baseline 02/02/2021: Patient reports  limited HEP in place currently from [facility] agency 12/14 compliance; 1/20/2023Patient states no questions regarding his current exercise regimen and states he is compliant.    Time 6    Period Weeks    Status Achieved    Target Date 03/16/21               PT Long Term Goals - 09/09/21 1022       PT LONG TERM GOAL #1   Title Pt will improve FOTO to target score of 50 to display perceived improvements in ability to complete ADL's.    Baseline 02/02/2021= 43 12/143: 51%; 04/29/2021= 53%; 09/09/2021= 53    Time 12    Period Weeks    Status Achieved      PT LONG TERM GOAL #2   Title Pt will decrease 5TSTS by at least 8 seconds in order to demonstrate clinically significant improvement in LE strength.    Baseline 02/02/2021= 28.22 sec with BUE Support 12/14: 23.1 seconds; 04/29/2021=Unable to test today due to patient became nauseous with 6 min walk test and attempted TUG- unable to continue- will reassess next 1-2 visits; 06/08/2021= 25.1 sec with min BUE support; 07/20/2021= 17.75 with min BUE support    Time 12    Period Weeks    Status Achieved    Target Date 07/22/21      PT LONG TERM GOAL #3   Title Pt will decrease TUG to below 19 seconds/decrease in order to demonstrate decreased fall risk.    Baseline 02/02/2021= 27.30 sec without AD 12/14: 23 seconds; 04/29/2021- Unable to assess as patient became very nauseated upon standing and request to stop treatment. 06/08/2021= 19.10 sec without an AD; 07/20/2021= 15.68 sec without UE Support    Time 12    Period Weeks    Status Achieved    Target Date 07/22/21      PT LONG TERM GOAL #4   Title Pt will increase 6MWT by at least 79m(1666f in order to demonstrate clinically significant improvement in cardiopulmonary endurance and community ambulation    Baseline 02/02/2021= 83 feet in 1 min 10 sec wihtout an AD 12/14: 200 ft in  54m57mtes; 04/29/2021=Patient ambulated approx 60 feet yet had to stop due onset of nausea and unable to complete today;  07/15/2021= 330 feet in 2 min 25 sec without AD.  09/09/2021= 350 feet in 3 min 45 sec.    Time 12    Period Weeks    Status Partially Met    Target Date 07/22/21      PT LONG  TERM GOAL #5   Title Pt will increase 10MWT by at least 0.2 m/s in order to demonstrate clinically significant improvement in community ambulation.    Baseline 02/02/2021= 0.42 m/s 12/14: 0.49 m/s; 04/29/2021=Patient unable to test today secondary to being nauseated. 06/08/2021= 0.62 m/s; 07/20/2021= 0.87 m/s    Time 12    Period Weeks    Status Achieved    Target Date 07/22/21      PT LONG TERM GOAL #6   Title Pt will decrease 5TSTS by at least 3 seconds in order to demonstrate clinically significant improvement in LE strength.    Baseline 07/20/2021= 17.75 sec with Minimal BUE Support; 09/09/2021= 17.50 sec with very minimal UE Support.    Time 12    Period Weeks    Status New    Target Date 10/12/21                   Plan - 09/21/21 1353     Clinical Impression Statement Pt presents with good motivation to complete PT activities this session but has some fatigue and c/o weakness a tstart of therapy. Pt provided with extended rest breaks this date as a result. Pt ocntinued with LE strength and endurance with some postural muscle strengthening incorporated. Pt elects to end session early today due to fatigue and some nausea. Pt asked if he needed anything following but declines and reports he would like to just go home and rest.   Pt will coninue to benefit from skilled PT intervention in order to improve his strength, endurance, balance and QOL.    Personal Factors and Comorbidities Comorbidity 3+    Comorbidities DM, Liver transplant, ESRD, CAD, feeding tube    Examination-Activity Limitations Carry;Lift;Reach Overhead;Squat;Stairs;Stand;Transfers;Toileting    Examination-Participation Restrictions Cleaning;Community Activity;Driving;Medication Management;Meal Prep;Yard Work    [employer] officer  Evolving/Moderate complexity    Rehab Potential Fair    PT Frequency 2x / week    PT Duration 12 weeks    PT Treatment/Interventions ADLs/Self Care Home Management;Cryotherapy;Moist Heat;DME Instruction;Gait training;Stair training;Functional mobility training;Therapeutic activities;Therapeutic exercise;Balance training;Neuromuscular re-education;Patient/family education;Wheelchair mobility training;Manual techniques;Passive range of motion;Dry needling;Energy conservation;Joint Manipulations    PT Next Visit Plan Progressive Therapeutic exercises, Balance training, transfer/gait training    PT Home Exercise Plan No changes or updates today.    Consulted and Agree with Plan of Care Patient             Patient will benefit from skilled therapeutic intervention in order to improve the following deficits and impairments:  Abnormal gait, Cardiopulmonary status limiting activity, Decreased activity tolerance, Decreased balance, Decreased coordination, Decreased endurance, Decreased mobility, Decreased range of motion, Decreased strength, Difficulty walking, Hypomobility, Impaired flexibility, Impaired UE functional use  Visit Diagnosis: Abnormality of gait and mobility  Difficulty in walking, not elsewhere classified  Muscle weakness (generalized)  Other abnormalities of gait and mobility     Problem List Patient Active Problem List   Diagnosis Date Noted   Pulmonary embolism ([place]) 08/08/2021   CAP (community acquired pneumonia) 08/08/2021   Protein calorie malnutrition ([place]) 08/08/2021   Hypokalemia 08/08/2021   Pancreatic cyst    Verbal auditory hallucination    Early satiety    Feeding tube dysfunction    Generalized weakness 11/26/2020   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[place]) 11/26/2020   Recurrent falls 11/26/2020   Syncope 10/15/2020   Uremia 10/10/2020   ESRD on hemodialysis ([place]) 10/10/2020   Hyperkalemia 10/10/2020   Elevated troponin 10/10/2020   GERD  (gastroesophageal reflux  disease) 10/10/2020   CAD (coronary artery disease) 10/10/2020   G tube feedings ([place]) 10/10/2020   Diarrhea 10/10/2020   Diabetes mellitus ([place]) 10/10/2020   Weakness    ACS (acute coronary syndrome) [facility])    Liver transplant recipient [facility])    Anemia of chronic disease    Type 2 diabetes mellitus with diabetic neuropathy, with long-term current use of insulin ([place])    ESRD (end stage renal disease) ([place]) 08/19/2020   History of anemia due to chronic kidney disease 08/19/2020   Gait abnormality 10/17/2019   History of fall within past 90 days 10/17/2019   Visual hallucination 10/17/2019   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

## 2021-09-26 ENCOUNTER — Ambulatory Visit: Payer: Medicare Other

## 2021-09-26 DIAGNOSIS — R269 Unspecified abnormalities of gait and mobility: Secondary | ICD-10-CM

## 2021-09-26 DIAGNOSIS — R296 Repeated falls: Secondary | ICD-10-CM

## 2021-09-26 DIAGNOSIS — R2681 Unsteadiness on feet: Secondary | ICD-10-CM

## 2021-09-26 DIAGNOSIS — R278 Other lack of coordination: Secondary | ICD-10-CM

## 2021-09-26 DIAGNOSIS — R262 Difficulty in walking, not elsewhere classified: Secondary | ICD-10-CM

## 2021-09-26 DIAGNOSIS — M6281 Muscle weakness (generalized): Secondary | ICD-10-CM

## 2021-09-26 DIAGNOSIS — R2689 Other abnormalities of gait and mobility: Secondary | ICD-10-CM

## 2021-09-26 NOTE — Therapy (Signed)
[place] MAIN [facility] SERVICES [address] [place], [place], [address] Phone: [phone]   Fax:  [phone]  Physical Therapy Treatment  Patient Details  Name: Alex Hampton MRN: [id number] Date of Birth: 14-Feb-1956 No data recorded  Encounter Date: 09/26/2021   PT End of Session - 09/26/21 1440     Visit Number 44    Number of Visits 4    Date for PT Re-Evaluation 10/12/21    Authorization Type Medicare/BCBS; PN 03/23/21    Authorization Time Period Initial Cert= 14/97/0263- 78/58/8502; Recert 7/74/1287-8/67/6720: Recert 9/47/0962- 11/11/6627    Progress Note Due on Visit 50    PT Start Time 1350    PT Stop Time 1422    PT Time Calculation (min) 32 min    Equipment Utilized During Treatment Gait belt    Activity Tolerance Patient limited by fatigue;Patient tolerated treatment well;No increased pain    Behavior During Therapy WFL for tasks assessed/performed             Past Medical History:  Diagnosis Date   Depression    History of cardiac cath    History of heart artery stent    Hyperlipidemia    Hypertension    MI (myocardial infarction) ([place])    Renal disorder     Past Surgical History:  Procedure Laterality Date   IR [place] TUBE CHANGE  11/22/2020   IR [place] TUBE CHANGE  11/26/2020   IR [place] GASTRO/COLONIC TUBE PERCUT W/FLUORO  10/14/2020   LEFT HEART CATH AND CORONARY ANGIOGRAPHY N/A 08/19/2020   Procedure: LEFT HEART CATH AND CORONARY ANGIOGRAPHY;  Surgeon: Corey Skains, MD;  Location: [facility];  Service: Cardiovascular;  Laterality: N/A;   LIVER TRANSPLANT      There were no vitals filed for this visit.   Subjective Assessment - 09/26/21 1439     Subjective Patient reports continued  stomach issues but not as bad overall. States he will do what he can today.    Pertinent History Patient is a 66 year old male with recent referral for abnomality of gait and recent liver transplant in November 2021. He has  past medical history significant for Liver transplant (02/18/2020), CKD, End stage renal disease- On hemodialyis T/TH/Sat., Anemia, DM with neuropathy, Non-stemi, Coronary artery disease, Feeding tube with poor appetite, PE now on eliquis    Patient Stated Goals I want to return to my normal self- Independent with all mobility, not fall, with improved overall stamina.               INTERVENTIONS:   Therapeutic Exercises:   Circuit style workout  1st rd: UE: Shoulder Flex using PVC 0 to 90 deg LE: Seated hip march with 2.5 lb AW x 10 reps Gait: 75 feet without AD and no AW  2nd rd:  UE: chest press/Row with PVC x 10 reps LE LAQ x 10 reps with 2.5 lb Gait: with 2.5 lb x 75 feet  3rd rd:  UE: Scap retraction seated with BTB x 10 LE: B hamstring curls BTB x 10  Gait: 75 feet without AD with 2.5 lb AW  4th rd:  UE: Bicep curl (pvc with 5lb AW)  x 10  LE: hip activity- up/over pvc pipe x 10 each LE  Gait: 75 feet  without AD and no weight    5th rd:  UE: chair dip x 10 reps LE: Mini squats with slow cadence x 10 reps Gait: 75  feet without AD or weights  6th rd:  UE: Seated shoulder ABD - Alt LE  x 10 reps LE: Stand hip ABD- Alf LE x 10 reps Gait: 75 feet without AD or weights  Education provided throughout session via VC/TC and demonstration to facilitate movement at target joints and correct muscle activation for all testing and exercises performed.   Rationale for Evaluation and Treatment Rehabilitation                  PT Education - 09/26/21 1440     Education Details Exercise technique    Person(s) Educated Patient    Methods Explanation;Demonstration;Tactile cues;Verbal cues    Comprehension Verbalized understanding;Verbal cues required;Returned demonstration;Tactile cues required;Need further instruction              PT Short Term Goals - 04/29/21 1058       PT SHORT TERM GOAL #1   Title Pt will be independent with HEP in order  to improve strength and balance in order to decrease fall risk and improve function at home and work.    Baseline 02/02/2021: Patient reports limited HEP in place currently from [facility] agency 12/14 compliance; 1/20/2023Patient states no questions regarding his current exercise regimen and states he is compliant.    Time 6    Period Weeks    Status Achieved    Target Date 03/16/21               PT Long Term Goals - 09/09/21 1022       PT LONG TERM GOAL #1   Title Pt will improve FOTO to target score of 50 to display perceived improvements in ability to complete ADL's.    Baseline 02/02/2021= 43 12/143: 51%; 04/29/2021= 53%; 09/09/2021= 53    Time 12    Period Weeks    Status Achieved      PT LONG TERM GOAL #2   Title Pt will decrease 5TSTS by at least 8 seconds in order to demonstrate clinically significant improvement in LE strength.    Baseline 02/02/2021= 28.22 sec with BUE Support 12/14: 23.1 seconds; 04/29/2021=Unable to test today due to patient became nauseous with 6 min walk test and attempted TUG- unable to continue- will reassess next 1-2 visits; 06/08/2021= 25.1 sec with min BUE support; 07/20/2021= 17.75 with min BUE support    Time 12    Period Weeks    Status Achieved    Target Date 07/22/21      PT LONG TERM GOAL #3   Title Pt will decrease TUG to below 19 seconds/decrease in order to demonstrate decreased fall risk.    Baseline 02/02/2021= 27.30 sec without AD 12/14: 23 seconds; 04/29/2021- Unable to assess as patient became very nauseated upon standing and request to stop treatment. 06/08/2021= 19.10 sec without an AD; 07/20/2021= 15.68 sec without UE Support    Time 12    Period Weeks    Status Achieved    Target Date 07/22/21      PT LONG TERM GOAL #4   Title Pt will increase 6MWT by at least 38m(1664f in order to demonstrate clinically significant improvement in cardiopulmonary endurance and community ambulation    Baseline 02/02/2021= 83 feet in 1 min 10 sec wihtout  an AD 12/14: 200 ft in  75m25mtes; 04/29/2021=Patient ambulated approx 60 feet yet had to stop due onset of nausea and unable to complete today; 07/15/2021= 330 feet in 2 min 25 sec without AD.  09/09/2021= 350 feet  in 3 min 45 sec.    Time 12    Period Weeks    Status Partially Met    Target Date 07/22/21      PT LONG TERM GOAL #5   Title Pt will increase 10MWT by at least 0.2 m/s in order to demonstrate clinically significant improvement in community ambulation.    Baseline 02/02/2021= 0.42 m/s 12/14: 0.49 m/s; 04/29/2021=Patient unable to test today secondary to being nauseated. 06/08/2021= 0.62 m/s; 07/20/2021= 0.87 m/s    Time 12    Period Weeks    Status Achieved    Target Date 07/22/21      PT LONG TERM GOAL #6   Title Pt will decrease 5TSTS by at least 3 seconds in order to demonstrate clinically significant improvement in LE strength.    Baseline 07/20/2021= 17.75 sec with Minimal BUE Support; 09/09/2021= 17.50 sec with very minimal UE Support.    Time 12    Period Weeks    Status New    Target Date 10/12/21                   Plan - 09/26/21 1441     Clinical Impression Statement Patient presents with good motivation and reports no worsening of stomach with today's session. He performed well with combination of Upper body, lower body and walking circuit workout today. He does fatigue quickly but able to progress overall ability to perform therex today. Pt will coninue to benefit from skilled PT intervention in order to improve his strength, endurance, balance and QOL    Personal Factors and Comorbidities Comorbidity 3+    Comorbidities DM, Liver transplant, ESRD, CAD, feeding tube    Examination-Activity Limitations Carry;Lift;Reach Overhead;Squat;Stairs;Stand;Transfers;Toileting    Examination-Participation Restrictions Cleaning;Community Activity;Driving;Medication Management;Meal Prep;Yard Work    Merchant navy officer Evolving/Moderate complexity    Rehab  Potential Fair    PT Frequency 2x / week    PT Duration 12 weeks    PT Treatment/Interventions ADLs/Self Care Home Management;Cryotherapy;Moist Heat;DME Instruction;Gait training;Stair training;Functional mobility training;Therapeutic activities;Therapeutic exercise;Balance training;Neuromuscular re-education;Patient/family education;Wheelchair mobility training;Manual techniques;Passive range of motion;Dry needling;Energy conservation;Joint Manipulations    PT Next Visit Plan Progressive Therapeutic exercises, Balance training, transfer/gait training    PT Home Exercise Plan No changes or updates today.    Consulted and Agree with Plan of Care Patient             Patient will benefit from skilled therapeutic intervention in order to improve the following deficits and impairments:  Abnormal gait, Cardiopulmonary status limiting activity, Decreased activity tolerance, Decreased balance, Decreased coordination, Decreased endurance, Decreased mobility, Decreased range of motion, Decreased strength, Difficulty walking, Hypomobility, Impaired flexibility, Impaired UE functional use  Visit Diagnosis: Abnormality of gait and mobility  Difficulty in walking, not elsewhere classified  Muscle weakness (generalized)  Other abnormalities of gait and mobility  Other lack of coordination  Unsteadiness on feet  Repeated falls     Problem List Patient Active Problem List   Diagnosis Date Noted   Pulmonary embolism ([place]) 08/08/2021   CAP (community acquired pneumonia) 08/08/2021   Protein calorie malnutrition ([place]) 08/08/2021   Hypokalemia 08/08/2021   Pancreatic cyst    Verbal auditory hallucination    Early satiety    Feeding tube dysfunction    Generalized weakness 11/26/2020   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[place]) 11/26/2020   Recurrent falls 11/26/2020   Syncope 10/15/2020   Uremia 10/10/2020   ESRD on hemodialysis ([place]) 10/10/2020   Hyperkalemia 10/10/2020   Elevated  troponin  10/10/2020   GERD (gastroesophageal reflux disease) 10/10/2020   CAD (coronary artery disease) 10/10/2020   G tube feedings ([place]) 10/10/2020   Diarrhea 10/10/2020   Diabetes mellitus ([place]) 10/10/2020   Weakness    ACS (acute coronary syndrome) ([place])    Liver transplant recipient [facility])    Anemia of chronic disease    Type 2 diabetes mellitus with diabetic neuropathy, with long-term current use of insulin ([place])    ESRD (end stage renal disease) ([place]) 08/19/2020   History of anemia due to chronic kidney disease 08/19/2020   Gait abnormality 10/17/2019   History of fall within past 90 days 10/17/2019   Visual hallucination 10/17/2019   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 [place], [place], [address] Phone: [phone]   Fax:  [phone]  Name: Alex Hampton MRN: [id number] Date of Birth: 09-28-1955

## 2021-09-28 ENCOUNTER — Ambulatory Visit: Payer: Medicare Other

## 2021-09-28 DIAGNOSIS — R262 Difficulty in walking, not elsewhere classified: Secondary | ICD-10-CM

## 2021-09-28 DIAGNOSIS — R296 Repeated falls: Secondary | ICD-10-CM

## 2021-09-28 DIAGNOSIS — R278 Other lack of coordination: Secondary | ICD-10-CM

## 2021-09-28 DIAGNOSIS — R269 Unspecified abnormalities of gait and mobility: Secondary | ICD-10-CM

## 2021-09-28 DIAGNOSIS — R2681 Unsteadiness on feet: Secondary | ICD-10-CM

## 2021-09-28 DIAGNOSIS — R2689 Other abnormalities of gait and mobility: Secondary | ICD-10-CM

## 2021-09-28 DIAGNOSIS — M6281 Muscle weakness (generalized): Secondary | ICD-10-CM

## 2021-09-28 NOTE — Therapy (Signed)
OUTPATIENT PHYSICAL THERAPY TREATMENT NOTE   Patient Name: Alex Hampton MRN: [id number] DOB:1956/03/20, 66 y.o., male Today's Date: 09/29/2021  PCP: Dr. Juluis Pitch REFERRING PROVIDER: Eulis Canner, MD   PT End of Session - 09/28/21 1312     Visit Number 45    Number of Visits 27    Date for PT Re-Evaluation 10/12/21    Authorization Type Medicare/BCBS; PN 03/23/21    Authorization Time Period Initial Cert= 86/57/8469- 62/95/2841; Recert 07/01/4008-2/72/5366: Recert 4/40/3474- 05/15/9561    Progress Note Due on Visit 50    PT Start Time 1300    PT Stop Time 1343    PT Time Calculation (min) 43 min    Equipment Utilized During Treatment Gait belt    Activity Tolerance Patient limited by fatigue;Patient tolerated treatment well;No increased pain    Behavior During Therapy WFL for tasks assessed/performed             Past Medical History:  Diagnosis Date   Depression    History of cardiac cath    History of heart artery stent    Hyperlipidemia    Hypertension    MI (myocardial infarction) ([place])    Renal disorder    Past Surgical History:  Procedure Laterality Date   IR [place]J TUBE CHANGE  11/22/2020   IR [place] TUBE CHANGE  11/26/2020   IR [place] GASTRO/COLONIC TUBE PERCUT W/FLUORO  10/14/2020   LEFT HEART CATH AND CORONARY ANGIOGRAPHY N/A 08/19/2020   Procedure: LEFT HEART CATH AND CORONARY ANGIOGRAPHY;  Surgeon: Corey Skains, MD;  Location: [place] [facility];  Service: Cardiovascular;  Laterality: N/A;   LIVER TRANSPLANT     Patient Active Problem List   Diagnosis Date Noted   Pulmonary embolism ([place]) 08/08/2021   CAP (community acquired pneumonia) 08/08/2021   Protein calorie malnutrition ([place]) 08/08/2021   Hypokalemia 08/08/2021   Pancreatic cyst    Verbal auditory hallucination    Early satiety    Feeding tube dysfunction    Generalized weakness 11/26/2020   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[place]) 11/26/2020   Recurrent falls 11/26/2020    Syncope 10/15/2020   Uremia 10/10/2020   ESRD on hemodialysis ([place]) 10/10/2020   Hyperkalemia 10/10/2020   Elevated troponin 10/10/2020   GERD (gastroesophageal reflux disease) 10/10/2020   CAD (coronary artery disease) 10/10/2020   G tube feedings ([place]) 10/10/2020   Diarrhea 10/10/2020   Diabetes mellitus ([place]) 10/10/2020   Weakness    ACS (acute coronary syndrome) ([place])    Liver transplant recipient [facility])    Anemia of chronic disease    Type 2 diabetes mellitus with diabetic neuropathy, with long-term current use of insulin ([place])    ESRD (end stage renal disease) ([place]) 08/19/2020   History of anemia due to chronic kidney disease 08/19/2020   Gait abnormality 10/17/2019   History of fall within past 90 days 10/17/2019   Visual hallucination 10/17/2019   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 10/17/2019   HTN (hypertension) 10/17/2019   History of MI (myocardial infarction) 10/17/2019   OSA (obstructive sleep apnea) 10/17/2019   Nondisplaced comminuted supracondylar fracture without intercondylar fracture of left humerus, subsequent encounter for fracture with nonunion 10/17/2019   Anxiety and depression 10/17/2019    REFERRING DIAG: Other malaise Z94.4 (ICD-10-CM) - Liver transplant status R26.9 (ICD-10-CM) - Unspecified abnormalities of gait and mobility   THERAPY DIAG:  Abnormality of gait and mobility  Difficulty in walking, not elsewhere classified  Muscle weakness (generalized)  Other  abnormalities of gait and mobility  Other lack of coordination  Unsteadiness on feet  Repeated falls  Rationale for Evaluation and Treatment Rehabilitation  PERTINENT HISTORY: Patient is a 66 year old male with recent referral for abnomality of gait and recent liver transplant in November 2021. He has past medical history significant for Liver transplant (02/18/2020), CKD, End stage renal disease- On hemodialyis T/TH/Sat., Anemia, DM with neuropathy, Non-stemi, Coronary artery  disease, Feeding tube with poor appetite, PE now on eliquis   PRECAUTIONS: none  SUBJECTIVE: Patient reports no new issues- reports ongoing stomach trouble but no worse today.  PAIN:  Are you having pain? No     TODAY'S TREATMENT:  09/28/2021  Therex: Circuit style workout  1st rd - step up  -Scap row -walk-75  2nd rd -minisquats - Shoulder ext with BTB - walk-75   -3rd round - mini lunges - partial push up at support bar - walk-75 feet  4th rd - calf raises -Chair dips -walk - 75 feet   5th rd - Donkey kick x 10 reps - Standing tricep kick back with 3lb - Walk 75 feet   6th round  - Sit to stand x 10 with minimal UE support - Bicep curl 3lb x 10  - walk 75 feet   Education provided throughout session via VC/TC and demonstration to facilitate movement at target joints and correct muscle activation for all testing and exercises performed.      PATIENT EDUCATION: Education details: Exercise technique Person educated: Patient Education method: Explanation, Demonstration, Tactile cues, and Verbal cues Education comprehension: verbalized understanding, returned demonstration, verbal cues required, tactile cues required, and needs further education   HOME EXERCISE PROGRAM: No updates   PT Short Term Goals -      PT SHORT TERM GOAL #1   Title Pt will be independent with HEP in order to improve strength and balance in order to decrease fall risk and improve function at home and work.    Baseline 02/02/2021: Patient reports limited HEP in place currently from [facility] agency 12/14 compliance; 1/20/2023Patient states no questions regarding his current exercise regimen and states he is compliant.    Time 6    Period Weeks    Status Achieved    Target Date 03/16/21              PT Long Term Goals -       PT LONG TERM GOAL #1   Title Pt will improve FOTO to target score of 50 to display perceived improvements in ability to complete ADL's.    Baseline  02/02/2021= 43 12/143: 51%; 04/29/2021= 53%; 09/09/2021= 53    Time 12    Period Weeks    Status Achieved      PT LONG TERM GOAL #2   Title Pt will decrease 5TSTS by at least 8 seconds in order to demonstrate clinically significant improvement in LE strength.    Baseline 02/02/2021= 28.22 sec with BUE Support 12/14: 23.1 seconds; 04/29/2021=Unable to test today due to patient became nauseous with 6 min walk test and attempted TUG- unable to continue- will reassess next 1-2 visits; 06/08/2021= 25.1 sec with min BUE support; 07/20/2021= 17.75 with min BUE support    Time 12    Period Weeks    Status Achieved    Target Date 07/22/21      PT LONG TERM GOAL #3   Title Pt will decrease TUG to below 19 seconds/decrease in order to demonstrate decreased fall  risk.    Baseline 02/02/2021= 27.30 sec without AD 12/14: 23 seconds; 04/29/2021- Unable to assess as patient became very nauseated upon standing and request to stop treatment. 06/08/2021= 19.10 sec without an AD; 07/20/2021= 15.68 sec without UE Support    Time 12    Period Weeks    Status Achieved    Target Date 07/22/21      PT LONG TERM GOAL #4   Title Pt will increase 6MWT by at least 957m(1643f in order to demonstrate clinically significant improvement in cardiopulmonary endurance and community ambulation    Baseline 02/02/2021= 83 feet in 1 min 10 sec wihtout an AD 12/14: 200 ft in  57m42mtes; 04/29/2021=Patient ambulated approx 60 feet yet had to stop due onset of nausea and unable to complete today; 07/15/2021= 330 feet in 2 min 25 sec without AD.  09/09/2021= 350 feet in 3 min 45 sec.    Time 12    Period Weeks    Status Partially Met    Target Date 07/22/21      PT LONG TERM GOAL #5   Title Pt will increase 10MWT by at least 0.2 m/s in order to demonstrate clinically significant improvement in community ambulation.    Baseline 02/02/2021= 0.42 m/s 12/14: 0.49 m/s; 04/29/2021=Patient unable to test today secondary to being nauseated. 06/08/2021=  0.62 m/s; 07/20/2021= 0.87 m/s    Time 12    Period Weeks    Status Achieved    Target Date 07/22/21      PT LONG TERM GOAL #6   Title Pt will decrease 5TSTS by at least 3 seconds in order to demonstrate clinically significant improvement in LE strength.    Baseline 07/20/2021= 17.75 sec with Minimal BUE Support; 09/09/2021= 17.50 sec with very minimal UE Support.    Time 12    Period Weeks    Status New    Target Date 10/12/21              Plan - 09/28/21 1312     Clinical Impression Statement Patient performed well today with circuit style workout. He continues to improve with his overall standing functional endurance and advancing with progressive strengthening exercises with shortened rest breaks overall. He remains limited by fatigue but noticably less rest breaks during session today. Pt will coninue to benefit from skilled PT intervention in order to improve his strength, endurance, balance and QOL    Personal Factors and Comorbidities Comorbidity 3+    Comorbidities DM, Liver transplant, ESRD, CAD, feeding tube    Examination-Activity Limitations Carry;Lift;Reach Overhead;Squat;Stairs;Stand;Transfers;Toileting    Examination-Participation Restrictions Cleaning;Community Activity;Driving;Medication Management;Meal Prep;Yard Work    StaMerchant navy officerolving/Moderate complexity    Rehab Potential Fair    PT Frequency 2x / week    PT Duration 12 weeks    PT Treatment/Interventions ADLs/Self Care Home Management;Cryotherapy;Moist Heat;DME Instruction;Gait training;Stair training;Functional mobility training;Therapeutic activities;Therapeutic exercise;Balance training;Neuromuscular re-education;Patient/family education;Wheelchair mobility training;Manual techniques;Passive range of motion;Dry needling;Energy conservation;Joint Manipulations    PT Next Visit Plan Progressive Therapeutic exercises, Balance training, transfer/gait training    PT Home Exercise Plan No  changes or updates today.    Consulted and Agree with Plan of Care Patient               JefLewis MoccasinT 09/29/2021, 8:46 PM

## 2021-10-03 ENCOUNTER — Ambulatory Visit: Payer: Medicare Other

## 2021-10-05 ENCOUNTER — Ambulatory Visit: Payer: Medicare Other

## 2021-10-05 DIAGNOSIS — R262 Difficulty in walking, not elsewhere classified: Secondary | ICD-10-CM

## 2021-10-05 DIAGNOSIS — R278 Other lack of coordination: Secondary | ICD-10-CM

## 2021-10-05 DIAGNOSIS — R2689 Other abnormalities of gait and mobility: Secondary | ICD-10-CM

## 2021-10-05 DIAGNOSIS — M6281 Muscle weakness (generalized): Secondary | ICD-10-CM

## 2021-10-05 DIAGNOSIS — R269 Unspecified abnormalities of gait and mobility: Secondary | ICD-10-CM

## 2021-10-05 DIAGNOSIS — R2681 Unsteadiness on feet: Secondary | ICD-10-CM

## 2021-10-05 DIAGNOSIS — R296 Repeated falls: Secondary | ICD-10-CM

## 2021-10-05 NOTE — Therapy (Signed)
OUTPATIENT PHYSICAL THERAPY TREATMENT NOTE   Patient Name: Alex Hampton MRN: [id number] DOB:[birth year], 66 y.o., male Today's Date: 10/06/2021  PCP: Dr. Juluis Pitch REFERRING PROVIDER: Eulis Canner, MD   PT End of Session - 10/05/21 1307     Visit Number 46    Number of Visits 23    Date for PT Re-Evaluation 10/12/21    Authorization Type Medicare/BCBS; PN 03/23/21    Authorization Time Period Initial Cert= 87/56/4332- 95/18/8416; Recert 09/14/3014-0/01/9322: Recert 5/57/3220- 05/15/4268    Progress Note Due on Visit 50    PT Start Time 1300    PT Stop Time 1326    PT Time Calculation (min) 26 min    Equipment Utilized During Treatment Gait belt    Activity Tolerance Patient limited by fatigue;Patient tolerated treatment well;No increased pain    Behavior During Therapy WFL for tasks assessed/performed             Past Medical History:  Diagnosis Date   Depression    History of cardiac cath    History of heart artery stent    Hyperlipidemia    Hypertension    MI (myocardial infarction) ([place])    Renal disorder    Past Surgical History:  Procedure Laterality Date   IR [place] TUBE CHANGE  11/22/2020   IR [place] TUBE CHANGE  11/26/2020   IR [place] GASTRO/COLONIC TUBE PERCUT W/FLUORO  10/14/2020   LEFT HEART CATH AND CORONARY ANGIOGRAPHY N/A 08/19/2020   Procedure: LEFT HEART CATH AND CORONARY ANGIOGRAPHY;  Surgeon: Corey Skains, MD;  Location: [facility];  Service: Cardiovascular;  Laterality: N/A;   LIVER TRANSPLANT     Patient Active Problem List   Diagnosis Date Noted   Pulmonary embolism ([place]) 08/08/2021   CAP (community acquired pneumonia) 08/08/2021   Protein calorie malnutrition ([place]) 08/08/2021   Hypokalemia 08/08/2021   Pancreatic cyst    Verbal auditory hallucination    Early satiety    Feeding tube dysfunction    Generalized weakness 11/26/2020   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[place]) 11/26/2020   Recurrent falls 11/26/2020    Syncope 10/15/2020   Uremia 10/10/2020   ESRD on hemodialysis ([place]) 10/10/2020   Hyperkalemia 10/10/2020   Elevated troponin 10/10/2020   GERD (gastroesophageal reflux disease) 10/10/2020   CAD (coronary artery disease) 10/10/2020   G tube feedings ([place]) 10/10/2020   Diarrhea 10/10/2020   Diabetes mellitus ([place]) 10/10/2020   Weakness    ACS (acute coronary syndrome) ([place])    Liver transplant recipient [facility])    Anemia of chronic disease    Type 2 diabetes mellitus with diabetic neuropathy, with long-term current use of insulin ([place])    ESRD (end stage renal disease) ([place]) 08/19/2020   History of anemia due to chronic kidney disease 08/19/2020   Gait abnormality 10/17/2019   History of fall within past 90 days 10/17/2019   Visual hallucination 10/17/2019   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 10/17/2019   HTN (hypertension) 10/17/2019   History of MI (myocardial infarction) 10/17/2019   OSA (obstructive sleep apnea) 10/17/2019   Nondisplaced comminuted supracondylar fracture without intercondylar fracture of left humerus, subsequent encounter for fracture with nonunion 10/17/2019   Anxiety and depression 10/17/2019    REFERRING DIAG: Other malaise Z94.4 (ICD-10-CM) - Liver transplant status R26.9 (ICD-10-CM) - Unspecified abnormalities of gait and mobility   THERAPY DIAG:  Abnormality of gait and mobility  Difficulty in walking, not elsewhere classified  Muscle weakness (generalized)  Other  abnormalities of gait and mobility  Other lack of coordination  Unsteadiness on feet  Repeated falls  Rationale for Evaluation and Treatment Rehabilitation  PERTINENT HISTORY: Patient is a 66 year old male with recent referral for abnomality of gait and recent liver transplant in November 2021. He has past medical history significant for Liver transplant (02/18/2020), CKD, End stage renal disease- On hemodialyis T/TH/Sat., Anemia, DM with neuropathy, Non-stemi, Coronary artery  disease, Feeding tube with poor appetite, PE now on eliquis   PRECAUTIONS: none  SUBJECTIVE: Patient reports his left great toe and stomach are bothering him fairly bad today.  PAIN:  Are you having pain? No     TODAY'S TREATMENT:  10/05/2021  Therex:  - Hip march 3lb AW 2 sets of 10 reps -Scap row- GTB- 2 sets of 10 reps - Bicep curl 4lb bar BUE -2 sets of 10 reps -LAQ 3lb AW - alt LE 2 sets x 10 reps           *Patient requested to stop and end session early due to   Not feeling well.    Education provided throughout session via VC/TC and demonstration to facilitate movement at target joints and correct muscle activation for all testing and exercises performed.      PATIENT EDUCATION: Education details: Exercise technique Person educated: Patient Education method: Explanation, Demonstration, Tactile cues, and Verbal cues Education comprehension: verbalized understanding, returned demonstration, verbal cues required, tactile cues required, and needs further education   HOME EXERCISE PROGRAM: No updates   PT Short Term Goals -      PT SHORT TERM GOAL #1   Title Pt will be independent with HEP in order to improve strength and balance in order to decrease fall risk and improve function at home and work.    Baseline 02/02/2021: Patient reports limited HEP in place currently from [facility] agency 12/14 compliance; 1/20/2023Patient states no questions regarding his current exercise regimen and states he is compliant.    Time 6    Period Weeks    Status Achieved    Target Date 03/16/21              PT Long Term Goals -       PT LONG TERM GOAL #1   Title Pt will improve FOTO to target score of 50 to display perceived improvements in ability to complete ADL's.    Baseline 02/02/2021= 43 12/143: 51%; 04/29/2021= 53%; 09/09/2021= 53    Time 12    Period Weeks    Status Achieved      PT LONG TERM GOAL #2   Title Pt will decrease 5TSTS by at least 8 seconds in order to  demonstrate clinically significant improvement in LE strength.    Baseline 02/02/2021= 28.22 sec with BUE Support 12/14: 23.1 seconds; 04/29/2021=Unable to test today due to patient became nauseous with 6 min walk test and attempted TUG- unable to continue- will reassess next 1-2 visits; 06/08/2021= 25.1 sec with min BUE support; 07/20/2021= 17.75 with min BUE support    Time 12    Period Weeks    Status Achieved    Target Date 07/22/21      PT LONG TERM GOAL #3   Title Pt will decrease TUG to below 19 seconds/decrease in order to demonstrate decreased fall risk.    Baseline 02/02/2021= 27.30 sec without AD 12/14: 23 seconds; 04/29/2021- Unable to assess as patient became very nauseated upon standing and request to stop treatment. 06/08/2021= 19.10 sec  without an AD; 07/20/2021= 15.68 sec without UE Support    Time 12    Period Weeks    Status Achieved    Target Date 07/22/21      PT LONG TERM GOAL #4   Title Pt will increase 6MWT by at least 736m(1651f in order to demonstrate clinically significant improvement in cardiopulmonary endurance and community ambulation    Baseline 02/02/2021= 83 feet in 1 min 10 sec wihtout an AD 12/14: 200 ft in  36m38mtes; 04/29/2021=Patient ambulated approx 60 feet yet had to stop due onset of nausea and unable to complete today; 07/15/2021= 330 feet in 2 min 25 sec without AD.  09/09/2021= 350 feet in 3 min 45 sec.    Time 12    Period Weeks    Status Partially Met    Target Date 07/22/21      PT LONG TERM GOAL #5   Title Pt will increase 10MWT by at least 0.2 m/s in order to demonstrate clinically significant improvement in community ambulation.    Baseline 02/02/2021= 0.42 m/s 12/14: 0.49 m/s; 04/29/2021=Patient unable to test today secondary to being nauseated. 06/08/2021= 0.62 m/s; 07/20/2021= 0.87 m/s    Time 12    Period Weeks    Status Achieved    Target Date 07/22/21      PT LONG TERM GOAL #6   Title Pt will decrease 5TSTS by at least 3 seconds in order to  demonstrate clinically significant improvement in LE strength.    Baseline 07/20/2021= 17.75 sec with Minimal BUE Support; 09/09/2021= 17.50 sec with very minimal UE Support.    Time 12    Period Weeks    Status New    Target Date 10/12/21              Plan -      Clinical Impression Statement Treatment was very limited today secondary to patient requesting to stop- c/o stomach upset and not feeling well. He was performing well overall with more seated exercises but not able to continue treatment. Pt will coninue to benefit from skilled PT intervention in order to improve his strength, endurance, balance and QOL    Personal Factors and Comorbidities Comorbidity 3+    Comorbidities DM, Liver transplant, ESRD, CAD, feeding tube    Examination-Activity Limitations Carry;Lift;Reach Overhead;Squat;Stairs;Stand;Transfers;Toileting    Examination-Participation Restrictions Cleaning;Community Activity;Driving;Medication Management;Meal Prep;Yard Work    StaMerchant navy officerolving/Moderate complexity    Rehab Potential Fair    PT Frequency 2x / week    PT Duration 12 weeks    PT Treatment/Interventions ADLs/Self Care Home Management;Cryotherapy;Moist Heat;DME Instruction;Gait training;Stair training;Functional mobility training;Therapeutic activities;Therapeutic exercise;Balance training;Neuromuscular re-education;Patient/family education;Wheelchair mobility training;Manual techniques;Passive range of motion;Dry needling;Energy conservation;Joint Manipulations    PT Next Visit Plan Progressive Therapeutic exercises, Balance training, transfer/gait training    PT Home Exercise Plan No changes or updates today.    Consulted and Agree with Plan of Care Patient               JefLewis MoccasinT 10/06/2021, 3:39 PM

## 2021-10-10 ENCOUNTER — Ambulatory Visit: Payer: Medicare Other | Attending: Gastroenterology

## 2021-10-10 DIAGNOSIS — R278 Other lack of coordination: Secondary | ICD-10-CM | POA: Insufficient documentation

## 2021-10-10 DIAGNOSIS — M6281 Muscle weakness (generalized): Secondary | ICD-10-CM | POA: Diagnosis present

## 2021-10-10 DIAGNOSIS — R269 Unspecified abnormalities of gait and mobility: Secondary | ICD-10-CM | POA: Insufficient documentation

## 2021-10-10 DIAGNOSIS — R296 Repeated falls: Secondary | ICD-10-CM | POA: Insufficient documentation

## 2021-10-10 DIAGNOSIS — R2689 Other abnormalities of gait and mobility: Secondary | ICD-10-CM | POA: Insufficient documentation

## 2021-10-10 DIAGNOSIS — R2681 Unsteadiness on feet: Secondary | ICD-10-CM | POA: Insufficient documentation

## 2021-10-10 DIAGNOSIS — R262 Difficulty in walking, not elsewhere classified: Secondary | ICD-10-CM | POA: Insufficient documentation

## 2021-10-10 NOTE — Therapy (Signed)
OUTPATIENT PHYSICAL THERAPY TREATMENT NOTE   Patient Name: Alex Hampton MRN: [id number] DOB:July 31, 1955, 66 y.o., male Today's Date: 10/10/2021  PCP: Dr. Juluis Pitch REFERRING PROVIDER: Eulis Canner, MD   PT End of Session - 10/10/21 1309     Visit Number 69    Number of Visits 19    Date for PT Re-Evaluation 10/12/21    Authorization Type Medicare/BCBS; PN 03/23/21    Authorization Time Period Initial Cert= 35/36/1443- 15/40/0867; Recert 09/27/5091-2/67/1245: Recert 11/17/9831- 11/10/5051    Progress Note Due on Visit 50    PT Start Time 9767    PT Stop Time 1340    PT Time Calculation (min) 35 min    Equipment Utilized During Treatment Gait belt    Activity Tolerance Patient limited by fatigue;Patient tolerated treatment well;No increased pain    Behavior During Therapy WFL for tasks assessed/performed             Past Medical History:  Diagnosis Date   Depression    History of cardiac cath    History of heart artery stent    Hyperlipidemia    Hypertension    MI (myocardial infarction) ([place])    Renal disorder    Past Surgical History:  Procedure Laterality Date   IR GJ TUBE CHANGE  11/22/2020   IR [place] TUBE CHANGE  11/26/2020   IR [place] GASTRO/COLONIC TUBE PERCUT W/FLUORO  10/14/2020   LEFT HEART CATH AND CORONARY ANGIOGRAPHY N/A 08/19/2020   Procedure: LEFT HEART CATH AND CORONARY ANGIOGRAPHY;  Surgeon: Corey Skains, MD;  Location: [facility];  Service: Cardiovascular;  Laterality: N/A;   LIVER TRANSPLANT     Patient Active Problem List   Diagnosis Date Noted   Pulmonary embolism ([place]) 08/08/2021   CAP (community acquired pneumonia) 08/08/2021   Protein calorie malnutrition ([place]) 08/08/2021   Hypokalemia 08/08/2021   Pancreatic cyst    Verbal auditory hallucination    Early satiety    Feeding tube dysfunction    Generalized weakness 11/26/2020   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[place]) 11/26/2020   Recurrent falls 11/26/2020    Syncope 10/15/2020   Uremia 10/10/2020   ESRD on hemodialysis ([place]) 10/10/2020   Hyperkalemia 10/10/2020   Elevated troponin 10/10/2020   GERD (gastroesophageal reflux disease) 10/10/2020   CAD (coronary artery disease) 10/10/2020   G tube feedings ([place]) 10/10/2020   Diarrhea 10/10/2020   Diabetes mellitus ([place]) 10/10/2020   Weakness    ACS (acute coronary syndrome) ([place])    Liver transplant recipient [facility])    Anemia of chronic disease    Type 2 diabetes mellitus with diabetic neuropathy, with long-term current use of insulin ([place])    ESRD (end stage renal disease) ([place]) 08/19/2020   History of anemia due to chronic kidney disease 08/19/2020   Gait abnormality 10/17/2019   History of fall within past 90 days 10/17/2019   Visual hallucination 10/17/2019   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 10/17/2019   HTN (hypertension) 10/17/2019   History of MI (myocardial infarction) 10/17/2019   OSA (obstructive sleep apnea) 10/17/2019   Nondisplaced comminuted supracondylar fracture without intercondylar fracture of left humerus, subsequent encounter for fracture with nonunion 10/17/2019   Anxiety and depression 10/17/2019    REFERRING DIAG: Other malaise Z94.4 (ICD-10-CM) - Liver transplant status R26.9 (ICD-10-CM) - Unspecified abnormalities of gait and mobility   THERAPY DIAG:  Abnormality of gait and mobility  Difficulty in walking, not elsewhere classified  Muscle weakness (generalized)  Other  abnormalities of gait and mobility  Other lack of coordination  Unsteadiness on feet  Repeated falls  Rationale for Evaluation and Treatment Rehabilitation  PERTINENT HISTORY: Patient is a 66 year old male with recent referral for abnomality of gait and recent liver transplant in November 2021. He has past medical history significant for Liver transplant (02/18/2020), CKD, End stage renal disease- On hemodialyis T/TH/Sat., Anemia, DM with neuropathy, Non-stemi, Coronary artery  disease, Feeding tube with poor appetite, PE now on eliquis   PRECAUTIONS: none  SUBJECTIVE: Patient reports stomach was okay yesterday and acting up again today.  PAIN:  Are you having pain? No     TODAY'S TREATMENT:   Therex:   -Forward/retro gait in // bars with 2 lb AW BLE x 5 down and back- intermittent use of UE support with retro gait. -Side step in // bars with 2 lb down and back x 2 - added up on toes and side step - down and back x 1. -Added crouched position- side squat/side down and back in // bars x 2.   -Chair dips x 10 reps.  -Scap retraction with 12.5 lb using matrix cable system x 12 reps x 2.  - standing Hip march 2lb AW 2 sets of  `10 reps - Bicep curl 3lb dumbell  BUE -2 sets of 10 reps -LAQ 2 lb AW - alt LE 2 sets x 10 reps -Heel to toe sequencing with 2lb AW and RTB for resistance- stepping forward to 1/2 foam x 10 BLE -standing on blue airex pad with forwad step tap onto 1/2 foam x 10 reps each LE.  -Static staggered stand (1LE on airex pad and 1LE on 1/2 foam)- hold x 30 sec x 3 trials.               Education provided throughout session via VC/TC and demonstration to facilitate movement at target joints and correct muscle activation for all testing and exercises performed.      PATIENT EDUCATION: Education details: Exercise technique Person educated: Patient Education method: Explanation, Demonstration, Tactile cues, and Verbal cues Education comprehension: verbalized understanding, returned demonstration, verbal cues required, tactile cues required, and needs further education   HOME EXERCISE PROGRAM: No updates   PT Short Term Goals -      PT SHORT TERM GOAL #1   Title Pt will be independent with HEP in order to improve strength and balance in order to decrease fall risk and improve function at home and work.    Baseline 02/02/2021: Patient reports limited HEP in place currently from [facility] agency 12/14 compliance; 1/20/2023Patient states no  questions regarding his current exercise regimen and states he is compliant.    Time 6    Period Weeks    Status Achieved    Target Date 03/16/21              PT Long Term Goals -       PT LONG TERM GOAL #1   Title Pt will improve FOTO to target score of 50 to display perceived improvements in ability to complete ADL's.    Baseline 02/02/2021= 43 12/143: 51%; 04/29/2021= 53%; 09/09/2021= 53    Time 12    Period Weeks    Status Achieved      PT LONG TERM GOAL #2   Title Pt will decrease 5TSTS by at least 8 seconds in order to demonstrate clinically significant improvement in LE strength.    Baseline 02/02/2021= 28.22 sec with BUE Support  12/14: 23.1 seconds; 04/29/2021=Unable to test today due to patient became nauseous with 6 min walk test and attempted TUG- unable to continue- will reassess next 1-2 visits; 06/08/2021= 25.1 sec with min BUE support; 07/20/2021= 17.75 with min BUE support    Time 12    Period Weeks    Status Achieved    Target Date 07/22/21      PT LONG TERM GOAL #3   Title Pt will decrease TUG to below 19 seconds/decrease in order to demonstrate decreased fall risk.    Baseline 02/02/2021= 27.30 sec without AD 12/14: 23 seconds; 04/29/2021- Unable to assess as patient became very nauseated upon standing and request to stop treatment. 06/08/2021= 19.10 sec without an AD; 07/20/2021= 15.68 sec without UE Support    Time 12    Period Weeks    Status Achieved    Target Date 07/22/21      PT LONG TERM GOAL #4   Title Pt will increase 6MWT by at least 334m(1631f in order to demonstrate clinically significant improvement in cardiopulmonary endurance and community ambulation    Baseline 02/02/2021= 83 feet in 1 min 10 sec wihtout an AD 12/14: 200 ft in  34m75mtes; 04/29/2021=Patient ambulated approx 60 feet yet had to stop due onset of nausea and unable to complete today; 07/15/2021= 330 feet in 2 min 25 sec without AD.  09/09/2021= 350 feet in 3 min 45 sec.    Time 12    Period  Weeks    Status Partially Met    Target Date 07/22/21      PT LONG TERM GOAL #5   Title Pt will increase 10MWT by at least 0.2 m/s in order to demonstrate clinically significant improvement in community ambulation.    Baseline 02/02/2021= 0.42 m/s 12/14: 0.49 m/s; 04/29/2021=Patient unable to test today secondary to being nauseated. 06/08/2021= 0.62 m/s; 07/20/2021= 0.87 m/s    Time 12    Period Weeks    Status Achieved    Target Date 07/22/21      PT LONG TERM GOAL #6   Title Pt will decrease 5TSTS by at least 3 seconds in order to demonstrate clinically significant improvement in LE strength.    Baseline 07/20/2021= 17.75 sec with Minimal BUE Support; 09/09/2021= 17.50 sec with very minimal UE Support.    Time 12    Period Weeks    Status New    Target Date 10/12/21              Plan -      Clinical Impression Statement Despite not feeling well patient is making some gains with overall standing functional endurance. He is taking shorter rest breaks overall and able to complete 2 rounds of most therex today.  Pt will coninue to benefit from skilled PT intervention in order to improve his strength, endurance, balance and QOL    Personal Factors and Comorbidities Comorbidity 3+    Comorbidities DM, Liver transplant, ESRD, CAD, feeding tube    Examination-Activity Limitations Carry;Lift;Reach Overhead;Squat;Stairs;Stand;Transfers;Toileting    Examination-Participation Restrictions Cleaning;Community Activity;Driving;Medication Management;Meal Prep;Yard Work    StaMerchant navy officerolving/Moderate complexity    Rehab Potential Fair    PT Frequency 2x / week    PT Duration 12 weeks    PT Treatment/Interventions ADLs/Self Care Home Management;Cryotherapy;Moist Heat;DME Instruction;Gait training;Stair training;Functional mobility training;Therapeutic activities;Therapeutic exercise;Balance training;Neuromuscular re-education;Patient/family education;Wheelchair mobility  training;Manual techniques;Passive range of motion;Dry needling;Energy conservation;Joint Manipulations    PT Next Visit Plan Progressive Therapeutic exercises, Balance training, transfer/gait  training    PT Home Exercise Plan No changes or updates today.    Consulted and Agree with Plan of Care Patient               Lewis Moccasin, PT 10/10/2021, 7:50 PM

## 2021-10-12 ENCOUNTER — Ambulatory Visit: Payer: Medicare Other

## 2021-10-12 DIAGNOSIS — R278 Other lack of coordination: Secondary | ICD-10-CM

## 2021-10-12 DIAGNOSIS — R296 Repeated falls: Secondary | ICD-10-CM

## 2021-10-12 DIAGNOSIS — R269 Unspecified abnormalities of gait and mobility: Secondary | ICD-10-CM

## 2021-10-12 DIAGNOSIS — R262 Difficulty in walking, not elsewhere classified: Secondary | ICD-10-CM

## 2021-10-12 DIAGNOSIS — R2689 Other abnormalities of gait and mobility: Secondary | ICD-10-CM

## 2021-10-12 DIAGNOSIS — M6281 Muscle weakness (generalized): Secondary | ICD-10-CM

## 2021-10-12 DIAGNOSIS — R2681 Unsteadiness on feet: Secondary | ICD-10-CM

## 2021-10-12 NOTE — Therapy (Signed)
OUTPATIENT PHYSICAL THERAPY TREATMENT NOTE/Recertification for dates 10/12/2021-01/04/2022   Patient Name: Alex TINOCO MRN: [id number] DOB:[birth year], 66 y.o., male Today's Date: 10/13/2021  PCP: Dr. Juluis Pitch REFERRING PROVIDER: Eulis Canner, MD   PT End of Session - 10/12/21 1300     Visit Number 48    Number of Visits 76    Date for PT Re-Evaluation 01/04/22    Authorization Type Medicare/BCBS; PN 03/23/21    Authorization Time Period Initial Cert= 09/32/3557- 32/20/2542; Recert 10/14/2374-2/83/1517: Recert 09/24/735- 1/0/6269: Recert 07/16/5460- 10/11/5007    Progress Note Due on Visit 50    PT Start Time 1259    PT Stop Time 1335    PT Time Calculation (min) 36 min    Equipment Utilized During Treatment Gait belt    Activity Tolerance Patient limited by fatigue;Patient tolerated treatment well;No increased pain    Behavior During Therapy WFL for tasks assessed/performed             Past Medical History:  Diagnosis Date   Depression    History of cardiac cath    History of heart artery stent    Hyperlipidemia    Hypertension    MI (myocardial infarction) ([place])    Renal disorder    Past Surgical History:  Procedure Laterality Date   IR [place] TUBE CHANGE  11/22/2020   IR [place] TUBE CHANGE  11/26/2020   IR [place] GASTRO/COLONIC TUBE PERCUT W/FLUORO  10/14/2020   LEFT HEART CATH AND CORONARY ANGIOGRAPHY N/A 08/19/2020   Procedure: LEFT HEART CATH AND CORONARY ANGIOGRAPHY;  Surgeon: Corey Skains, MD;  Location: [facility];  Service: Cardiovascular;  Laterality: N/A;   LIVER TRANSPLANT     Patient Active Problem List   Diagnosis Date Noted   Pulmonary embolism ([place]) 08/08/2021   CAP (community acquired pneumonia) 08/08/2021   Protein calorie malnutrition ([place]) 08/08/2021   Hypokalemia 08/08/2021   Pancreatic cyst    Verbal auditory hallucination    Early satiety    Feeding tube dysfunction    Generalized weakness 11/26/2020   Gastrostomy  tube dysfunction ([place]) 11/26/2020   Recurrent falls 11/26/2020   Syncope 10/15/2020   Uremia 10/10/2020   ESRD on hemodialysis ([place]) 10/10/2020   Hyperkalemia 10/10/2020   Elevated troponin 10/10/2020   GERD (gastroesophageal reflux disease) 10/10/2020   CAD (coronary artery disease) 10/10/2020   G tube feedings ([place]) 10/10/2020   Diarrhea 10/10/2020   Diabetes mellitus ([place]) 10/10/2020   Weakness    ACS (acute coronary syndrome) ([place])    Liver transplant recipient [facility])    Anemia of chronic disease    Type 2 diabetes mellitus with diabetic neuropathy, with long-term current use of insulin ([place])    ESRD (end stage renal disease) ([place]) 08/19/2020   History of anemia due to chronic kidney disease 08/19/2020   Gait abnormality 10/17/2019   History of fall within past 90 days 10/17/2019   Visual hallucination 10/17/2019   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 10/17/2019   HTN (hypertension) 10/17/2019   History of MI (myocardial infarction) 10/17/2019   OSA (obstructive sleep apnea) 10/17/2019   Nondisplaced comminuted supracondylar fracture without intercondylar fracture of left humerus, subsequent encounter for fracture with nonunion 10/17/2019   Anxiety and depression 10/17/2019    REFERRING DIAG: Other malaise Z94.4 (ICD-10-CM) - Liver transplant status R26.9 (ICD-10-CM) - Unspecified abnormalities of gait and mobility   THERAPY DIAG:  Abnormality of gait and mobility  Difficulty in walking, not elsewhere classified  Muscle weakness (generalized)  Other abnormalities of gait and mobility  Other lack of coordination  Unsteadiness on feet  Repeated falls  Rationale for Evaluation and Treatment Rehabilitation  PERTINENT HISTORY: Patient is a 66 year old male with recent referral for abnomality of gait and recent liver transplant in November 2021. He has past medical history significant for Liver transplant (02/18/2020), CKD, End stage renal disease- On hemodialyis  T/TH/Sat., Anemia, DM with neuropathy, Non-stemi, Coronary artery disease, Feeding tube with poor appetite, PE now on eliquis   PRECAUTIONS: none  SUBJECTIVE: Patient reports no changes since last session. Reports he went home and rested after last visit  PAIN:  Are you having pain? No     TODAY'S TREATMENT:     Reassessed goals for recertification:     6 min Walk test= 710 feet in 23mn (short stand break)     5 time Sit To Stand= 14.90 sec (    FOTO= 54 (goal continues to be achieved)   Therex:   -Side lunge squat onto 1/2 foam x 10 reps B LE -Scap retraction: BTB- x 12 reps   - Bicep curl 3lb dumbell  BUE -2 sets of 10 reps           *Patient requested to stop due to undo fatigue.   Education provided throughout session via VC/TC and demonstration to facilitate movement at target joints and correct muscle activation for all testing and exercises performed.      PATIENT EDUCATION: Education details: Exercise technique Person educated: Patient Education method: Explanation, Demonstration, Tactile cues, and Verbal cues Education comprehension: verbalized understanding, returned demonstration, verbal cues required, tactile cues required, and needs further education   HOME EXERCISE PROGRAM: No updates   PT Short Term Goals -      PT SHORT TERM GOAL #1   Title Pt will be independent with HEP in order to improve strength and balance in order to decrease fall risk and improve function at home and work.    Baseline 02/02/2021: [facility]; 1/20/2023Patient states no questions regarding his current exercise regimen and states he is compliant.    Time 6    Period Weeks    Status Achieved    Target Date 03/16/21              PT Long Term Goals -       PT LONG TERM GOAL #1   Title Pt will improve FOTO to target score of 50 to display perceived improvements in ability to complete ADL's.     Baseline 02/02/2021= 43 12/143: 51%; 04/29/2021= 53%; 09/09/2021= 53; 10/12/2021=54%   Time 12    Period Weeks    Status Achieved      PT LONG TERM GOAL #2   Title Pt will decrease 5TSTS by at least 8 seconds in order to demonstrate clinically significant improvement in LE strength.    Baseline 02/02/2021= 28.22 sec with BUE Support 12/14: 23.1 seconds; 04/29/2021=Unable to test today due to patient became nauseous with 6 min walk test and attempted TUG- unable to continue- will reassess next 1-2 visits; 06/08/2021= 25.1 sec with min BUE support; 07/20/2021= 17.75 with min BUE support    Time 12    Period Weeks    Status Achieved    Target Date 07/22/21      PT LONG TERM GOAL #3   Title Pt will decrease TUG to below 19 seconds/decrease in order to  demonstrate decreased fall risk.    Baseline 02/02/2021= 27.30 sec without AD 12/14: 23 seconds; 04/29/2021- Unable to assess as patient became very nauseated upon standing and request to stop treatment. 06/08/2021= 19.10 sec without an AD; 07/20/2021= 15.68 sec without UE Support    Time 12    Period Weeks    Status Achieved    Target Date 07/22/21      PT LONG TERM GOAL #4   Title Pt will increase 6MWT by at least 31m(1644f in order to demonstrate clinically significant improvement in cardiopulmonary endurance and community ambulation    Baseline 02/02/2021= 83 feet in 1 min 10 sec wihtout an AD 12/14: 200 ft in  65m95mtes; 04/29/2021=Patient ambulated approx 60 feet yet had to stop due onset of nausea and unable to complete today; 07/15/2021= 330 feet in 2 min 25 sec without AD.  09/09/2021= 350 feet in 3 min 45 sec. 10/12/2021= 710 feet in 6 min- no AD (Short stand rest break)    Time 12    Period Weeks    Status Achieved   Target Date 07/22/21      PT LONG TERM GOAL #5   Title Pt will increase 10MWT by at least 0.2 m/s in order to demonstrate clinically significant improvement in community ambulation.    Baseline 02/02/2021= 0.42 m/s 12/14: 0.49 m/s;  04/29/2021=Patient unable to test today secondary to being nauseated. 06/08/2021= 0.62 m/s; 07/20/2021= 0.87 m/s    Time 12    Period Weeks    Status Achieved    Target Date 07/22/21      PT LONG TERM GOAL #6   Title Pt will decrease 5TSTS by at least 3 seconds in order to demonstrate clinically significant improvement in LE strength.    Baseline 07/20/2021= 17.75 sec with Minimal BUE Support; 09/09/2021= 17.50 sec with very minimal UE Support. 10/12/2021= 14.90 sec with min BUE Support.    Time 12    Period Weeks    Status Ongoing   Target Date 10/12/21    PT LONG TERM GOAL #7 Title:  Pt will increase 6MWT by at least 22m32m4ft9f order to demonstrate clinically significant improvement in cardiopulmonary endurance and community ambulation                                                       Baseline: 10/12/2021= 710 feet Goal status: INITIAL              Plan -      Clinical Impression Statement Patient has made significant progress as seen during testing today- improving with functional LE strength with 5x STS. He has also made significant progress with functional endurance as seen by ability to complete a 6 min walk test for the first time today. He continues to be challenged with building back his overall strength and endurance including participating for a full 45 min session due to weakness and fatigue. Patient's condition has the potential to improve in response to therapy. Maximum improvement is yet to be obtained. The anticipated improvement is attainable and reasonable in a generally predictable time.     Personal Factors and Comorbidities Comorbidity 3+    Comorbidities DM, Liver transplant, ESRD, CAD, feeding tube    Examination-Activity Limitations Carry;Lift;Reach Overhead;Squat;Stairs;Stand;Transfers;Toileting    Examination-Participation Restrictions Cleaning;Community Activity;Driving;Medication Management;Meal Prep;Yard Valla Leaver  Stability/Clinical Decision Making  Evolving/Moderate complexity    Rehab Potential Fair    PT Frequency 2x / week    PT Duration 12 weeks    PT Treatment/Interventions ADLs/Self Care Home Management;Cryotherapy;Moist Heat;DME Instruction;Gait training;Stair training;Functional mobility training;Therapeutic activities;Therapeutic exercise;Balance training;Neuromuscular re-education;Patient/family education;Wheelchair mobility training;Manual techniques;Passive range of motion;Dry needling;Energy conservation;Joint Manipulations    PT Next Visit Plan Progressive Therapeutic exercises, Balance training, transfer/gait training    PT Home Exercise Plan No changes or updates today.    Consulted and Agree with Plan of Care Patient               Lewis Moccasin, PT 10/13/2021, 3:45 PM

## 2021-10-17 ENCOUNTER — Ambulatory Visit: Payer: Medicare Other

## 2021-10-17 DIAGNOSIS — R278 Other lack of coordination: Secondary | ICD-10-CM

## 2021-10-17 DIAGNOSIS — R269 Unspecified abnormalities of gait and mobility: Secondary | ICD-10-CM

## 2021-10-17 DIAGNOSIS — R262 Difficulty in walking, not elsewhere classified: Secondary | ICD-10-CM

## 2021-10-17 DIAGNOSIS — M6281 Muscle weakness (generalized): Secondary | ICD-10-CM

## 2021-10-17 DIAGNOSIS — R2689 Other abnormalities of gait and mobility: Secondary | ICD-10-CM

## 2021-10-17 DIAGNOSIS — R296 Repeated falls: Secondary | ICD-10-CM

## 2021-10-17 DIAGNOSIS — R2681 Unsteadiness on feet: Secondary | ICD-10-CM

## 2021-10-17 NOTE — Therapy (Signed)
OUTPATIENT PHYSICAL THERAPY TREATMENT NOTE/Recertification for dates 10/12/2021-01/04/2022   Patient Name: Alex Hampton MRN: [id number] DOB:07/01/1955, 66 y.o., male Today's Date: 10/17/2021  PCP: Dr. Juluis Pitch REFERRING PROVIDER: Eulis Canner, MD   PT End of Session - 10/17/21 1307     Visit Number 29    Number of Visits 74    Date for PT Re-Evaluation 01/04/22    Authorization Type Medicare/BCBS; PN 03/23/21    Authorization Time Period Initial Cert= 46/27/0350- 09/38/1829; Recert 9/37/1696-7/89/3810: Recert 1/75/1025- 11/12/2776: Recert 05/15/2351- 09/21/4313    Progress Note Due on Visit 50    PT Start Time 1300    PT Stop Time 1343    PT Time Calculation (min) 43 min    Equipment Utilized During Treatment Gait belt    Activity Tolerance Patient limited by fatigue;Patient tolerated treatment well;No increased pain    Behavior During Therapy WFL for tasks assessed/performed             Past Medical History:  Diagnosis Date   Depression    History of cardiac cath    History of heart artery stent    Hyperlipidemia    Hypertension    MI (myocardial infarction) ([place])    Renal disorder    Past Surgical History:  Procedure Laterality Date   IR [place] TUBE CHANGE  11/22/2020   IR [place] TUBE CHANGE  11/26/2020   IR [place] GASTRO/COLONIC TUBE PERCUT W/FLUORO  10/14/2020   LEFT HEART CATH AND CORONARY ANGIOGRAPHY N/A 08/19/2020   Procedure: LEFT HEART CATH AND CORONARY ANGIOGRAPHY;  Surgeon: Corey Skains, MD;  Location: [facility];  Service: Cardiovascular;  Laterality: N/A;   LIVER TRANSPLANT     Patient Active Problem List   Diagnosis Date Noted   Pulmonary embolism ([place]) 08/08/2021   CAP (community acquired pneumonia) 08/08/2021   Protein calorie malnutrition ([place]) 08/08/2021   Hypokalemia 08/08/2021   Pancreatic cyst    Verbal auditory hallucination    Early satiety    Feeding tube dysfunction    Generalized weakness 11/26/2020    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[place]) 11/26/2020   Recurrent falls 11/26/2020   Syncope 10/15/2020   Uremia 10/10/2020   ESRD on hemodialysis ([place]) 10/10/2020   Hyperkalemia 10/10/2020   Elevated troponin 10/10/2020   GERD (gastroesophageal reflux disease) 10/10/2020   CAD (coronary artery disease) 10/10/2020   G tube feedings ([place]) 10/10/2020   Diarrhea 10/10/2020   Diabetes mellitus ([place]) 10/10/2020   Weakness    ACS (acute coronary syndrome) ([place])    Liver transplant recipient [facility])    Anemia of chronic disease    Type 2 diabetes mellitus with diabetic neuropathy, with long-term current use of insulin ([place])    ESRD (end stage renal disease) ([place]) 08/19/2020   History of anemia due to chronic kidney disease 08/19/2020   Gait abnormality 10/17/2019   History of fall within past 90 days 10/17/2019   Visual hallucination 10/17/2019   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 10/17/2019   HTN (hypertension) 10/17/2019   History of MI (myocardial infarction) 10/17/2019   OSA (obstructive sleep apnea) 10/17/2019   Nondisplaced comminuted supracondylar fracture without intercondylar fracture of left humerus, subsequent encounter for fracture with nonunion 10/17/2019   Anxiety and depression 10/17/2019    REFERRING DIAG: Other malaise Z94.4 (ICD-10-CM) - Liver transplant status R26.9 (ICD-10-CM) - Unspecified abnormalities of gait and mobility   THERAPY DIAG:  Abnormality of gait and mobility  Difficulty in walking, not elsewhere classified  Muscle weakness (generalized)  Other abnormalities of gait and mobility  Other lack of coordination  Unsteadiness on feet  Repeated falls  Rationale for Evaluation and Treatment Rehabilitation  PERTINENT HISTORY: Patient is a 66 year old male with recent referral for abnomality of gait and recent liver transplant in November 2021. He has past medical history significant for Liver transplant (02/18/2020), CKD, End stage renal disease- On  hemodialyis T/TH/Sat., Anemia, DM with neuropathy, Non-stemi, Coronary artery disease, Feeding tube with poor appetite, PE now on eliquis   PRECAUTIONS: none  SUBJECTIVE: Patient reports feeling okay today without any major complaints.    PAIN:  Are you having pain? No     TODAY'S TREATMENT:     Therex:    -Step tap with 3lb AW alt LE x 12 reps  -   -Scap retraction: BTB- x 12 reps      - Sit to stand x 10 reps   --Step tap with 3lb AW alt LE x 10 reps  -   -Scap retraction: BTB- x 10 reps    - Sit to stand x 8 reps   - Step tap with 3lb AW alt LE x 6reps   - Scap retraction: BTB x 6 reps    - Sit to stand x 6 reps   - Step tap with 3lb AW alt LE x 4 reps   - Scap retraction: BTB x 4 reps    - Ambulation- 75 feet with 3lb AW BLE    - Bicep curl - No weight - AROM x 6 reps   - Chair dips x 6 reps   - LAQ with 3lb AW Alt LE x 6 reps    - Ambulation- 75 feet with 3lb AW BLE    - Bicep curl - No weight - AROM x 4 reps   - Chair dips x 4 reps   - LAQ with 3lb AW Alt LE x 4 reps    - Ambulation- 75 feet with 3lb AW BLE    - Bicep curl - No weight - AROM x 2 reps   - Chair dips x 2 reps   - LAQ with 3lb AW Alt LE x 2 reps    - Ambulation x 75 feet    - Shoulder flex with 1lb [place] on left and 2 lb on right x 6   - Hip march with 3lb AW x 6 reps alt LE       - Ambulation x 75 feet    - Shoulder flex with 1lb [place] on left and 2 lb on right x 4   - Hip march with 3lb AW x 4 reps alt LE     - Ambulation x 150 feet    - Shoulder flex with 1lb [place] on left and 2 lb on right x 2   - Hip march with 3lb AW x 2 reps alt LE                Education provided throughout session via VC/TC and demonstration to facilitate movement at target joints and correct muscle activation for all testing and exercises performed.      PATIENT EDUCATION: Education details: Exercise technique Person educated: Patient Education method: Explanation, Demonstration, Tactile cues,  and Verbal cues Education comprehension: verbalized understanding, returned demonstration, verbal cues required, tactile cues required, and needs further education   HOME EXERCISE PROGRAM: No updates   PT Short Term Goals -      PT  SHORT TERM GOAL #1   Title Pt will be independent with HEP in order to improve strength and balance in order to decrease fall risk and improve function at home and work.    Baseline 02/02/2021: Patient reports limited HEP in place [facility] 12/14 compliance; 1/20/2023Patient states no questions regarding his current exercise regimen and states he is compliant.    Time 6    Period Weeks    Status Achieved    Target Date 03/16/21              PT Long Term Goals -       PT LONG TERM GOAL #1   Title Pt will improve FOTO to target score of 50 to display perceived improvements in ability to complete ADL's.    Baseline 02/02/2021= 43 12/143: 51%; 04/29/2021= 53%; 09/09/2021= 53; 10/12/2021=54%   Time 12    Period Weeks    Status Achieved      PT LONG TERM GOAL #2   Title Pt will decrease 5TSTS by at least 8 seconds in order to demonstrate clinically significant improvement in LE strength.    Baseline 02/02/2021= 28.22 sec with BUE Support 12/14: 23.1 seconds; 04/29/2021=Unable to test today due to patient became nauseous with 6 min walk test and attempted TUG- unable to continue- will reassess next 1-2 visits; 06/08/2021= 25.1 sec with min BUE support; 07/20/2021= 17.75 with min BUE support    Time 12    Period Weeks    Status Achieved    Target Date 07/22/21      PT LONG TERM GOAL #3   Title Pt will decrease TUG to below 19 seconds/decrease in order to demonstrate decreased fall risk.    Baseline 02/02/2021= 27.30 sec without AD 12/14: 23 seconds; 04/29/2021- Unable to assess as patient became very nauseated upon standing and request to stop treatment. 06/08/2021= 19.10 sec without an AD; 07/20/2021= 15.68 sec without UE Support    Time 12     Period Weeks    Status Achieved    Target Date 07/22/21      PT LONG TERM GOAL #4   Title Pt will increase 6MWT by at least 69m(1678f in order to demonstrate clinically significant improvement in cardiopulmonary endurance and community ambulation    Baseline 02/02/2021= 83 feet in 1 min 10 sec wihtout an AD 12/14: 200 ft in  28m36mtes; 04/29/2021=Patient ambulated approx 60 feet yet had to stop due onset of nausea and unable to complete today; 07/15/2021= 330 feet in 2 min 25 sec without AD.  09/09/2021= 350 feet in 3 min 45 sec. 10/12/2021= 710 feet in 6 min- no AD (Short stand rest break)    Time 12    Period Weeks    Status Achieved   Target Date 07/22/21      PT LONG TERM GOAL #5   Title Pt will increase 10MWT by at least 0.2 m/s in order to demonstrate clinically significant improvement in community ambulation.    Baseline 02/02/2021= 0.42 m/s 12/14: 0.49 m/s; 04/29/2021=Patient unable to test today secondary to being nauseated. 06/08/2021= 0.62 m/s; 07/20/2021= 0.87 m/s    Time 12    Period Weeks    Status Achieved    Target Date 07/22/21      PT LONG TERM GOAL #6   Title Pt will decrease 5TSTS by at least 3 seconds in order to demonstrate clinically significant improvement in LE strength.    Baseline 07/20/2021= 17.75 sec with Minimal  BUE Support; 09/09/2021= 17.50 sec with very minimal UE Support. 10/12/2021= 14.90 sec with min BUE Support.    Time 12    Period Weeks    Status Ongoing   Target Date 10/12/21    PT LONG TERM GOAL #7 Title:  Pt will increase 6MWT by at least 55m(1633f in order to demonstrate clinically significant improvement in cardiopulmonary endurance and community ambulation                                                       Baseline: 10/12/2021= 710 feet Goal status: INITIAL              Plan -      Clinical Impression Statement Patient presents with ongoing improvement in Stamina and LE strengthening. He was able to modify reps and rest to accomodate all  exercises and performed well overall. He was able to participate the entire session by modifying reps and taking short rest breaks. Patient will continue to benefit from skilled physical therapy in order to improve his lower extremity strength, balance, mobility, and quality of life.   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 officervolving/Moderate complexity    Rehab Potential Fair    PT Frequency 2x / week    PT Duration 12 weeks    PT Treatment/Interventions ADLs/Self Care Home Management;Cryotherapy;Moist Heat;DME Instruction;Gait training;Stair training;Functional mobility training;Therapeutic activities;Therapeutic exercise;Balance training;Neuromuscular re-education;Patient/family education;Wheelchair mobility training;Manual techniques;Passive range of motion;Dry needling;Energy conservation;Joint Manipulations    PT Next Visit Plan Progressive Therapeutic exercises, Balance training, transfer/gait training    PT Home Exercise Plan No changes or updates today.    Consulted and Agree with Plan of Care Patient               JeLewis MoccasinPT 10/17/2021, 2:10 PM

## 2021-10-19 ENCOUNTER — Ambulatory Visit: Payer: Medicare Other

## 2021-10-24 ENCOUNTER — Ambulatory Visit: Payer: Medicare Other

## 2021-10-24 DIAGNOSIS — M6281 Muscle weakness (generalized): Secondary | ICD-10-CM

## 2021-10-24 DIAGNOSIS — R278 Other lack of coordination: Secondary | ICD-10-CM

## 2021-10-24 DIAGNOSIS — R296 Repeated falls: Secondary | ICD-10-CM

## 2021-10-24 DIAGNOSIS — R262 Difficulty in walking, not elsewhere classified: Secondary | ICD-10-CM

## 2021-10-24 DIAGNOSIS — R2681 Unsteadiness on feet: Secondary | ICD-10-CM

## 2021-10-24 DIAGNOSIS — R269 Unspecified abnormalities of gait and mobility: Secondary | ICD-10-CM

## 2021-10-24 DIAGNOSIS — R2689 Other abnormalities of gait and mobility: Secondary | ICD-10-CM

## 2021-10-24 NOTE — Therapy (Signed)
OUTPATIENT PHYSICAL THERAPY TREATMENT NOTE/PROGRESS NOTE  Dates of reporting period: 09/09/21 - 10/24/21   Patient Name: Alex Hampton MRN: [id number] DOB:1955/06/05, 66 y.o., male Today's Date: 10/24/2021  PCP: Dr. Juluis Pitch REFERRING PROVIDER: Eulis Canner, MD   PT End of Session - 10/24/21 1256     Visit Number 50    Number of Visits 59    Date for PT Re-Evaluation 01/04/22    Authorization Type Medicare/BCBS; PN 03/23/21    Authorization Time Period Initial Cert= 96/28/3662- 94/76/5465; Recert 0/35/4656-11/20/7515: Recert 0/04/7492- 07/17/6757: Recert 04/15/3844- 6/59/9357    Progress Note Due on Visit 50    PT Start Time 1300    PT Stop Time 1342    PT Time Calculation (min) 42 min    Equipment Utilized During Treatment Gait belt    Activity Tolerance Patient limited by fatigue;Patient tolerated treatment well;No increased pain    Behavior During Therapy WFL for tasks assessed/performed             Past Medical History:  Diagnosis Date   Depression    History of cardiac cath    History of heart artery stent    Hyperlipidemia    Hypertension    MI (myocardial infarction) ([place])    Renal disorder    Past Surgical History:  Procedure Laterality Date   IR [place] TUBE CHANGE  11/22/2020   IR [place] TUBE CHANGE  11/26/2020   IR [place] GASTRO/COLONIC TUBE PERCUT W/FLUORO  10/14/2020   LEFT HEART CATH AND CORONARY ANGIOGRAPHY N/A 08/19/2020   Procedure: LEFT HEART CATH AND CORONARY ANGIOGRAPHY;  Surgeon: Corey Skains, MD;  Location: [facility];  Service: Cardiovascular;  Laterality: N/A;   LIVER TRANSPLANT     Patient Active Problem List   Diagnosis Date Noted   Pulmonary embolism ([place]) 08/08/2021   CAP (community acquired pneumonia) 08/08/2021   Protein calorie malnutrition ([place]) 08/08/2021   Hypokalemia 08/08/2021   Pancreatic cyst    Verbal auditory hallucination    Early satiety    Feeding tube dysfunction    Generalized weakness  11/26/2020   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[place]) 11/26/2020   Recurrent falls 11/26/2020   Syncope 10/15/2020   Uremia 10/10/2020   ESRD on hemodialysis ([place]) 10/10/2020   Hyperkalemia 10/10/2020   Elevated troponin 10/10/2020   GERD (gastroesophageal reflux disease) 10/10/2020   CAD (coronary artery disease) 10/10/2020   G tube feedings ([place]) 10/10/2020   Diarrhea 10/10/2020   Diabetes mellitus ([place]) 10/10/2020   Weakness    ACS (acute coronary syndrome) ([place])    Liver transplant recipient [facility])    Anemia of chronic disease    Type 2 diabetes mellitus with diabetic neuropathy, with long-term current use of insulin ([place])    ESRD (end stage renal disease) ([place]) 08/19/2020   History of anemia due to chronic kidney disease 08/19/2020   Gait abnormality 10/17/2019   History of fall within past 90 days 10/17/2019   Visual hallucination 10/17/2019   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 CT 10/17/2019   HTN (hypertension) 10/17/2019   History of MI (myocardial infarction) 10/17/2019   OSA (obstructive sleep apnea) 10/17/2019   Nondisplaced comminuted supracondylar fracture without intercondylar fracture of left humerus, subsequent encounter for fracture with nonunion 10/17/2019   Anxiety and depression 10/17/2019    REFERRING DIAG: Other malaise Z94.4 (ICD-10-CM) - Liver transplant status R26.9 (ICD-10-CM) - Unspecified abnormalities of gait and mobility   THERAPY DIAG:  Abnormality of gait and mobility  Difficulty in walking, not elsewhere classified  Muscle weakness (generalized)  Other abnormalities of gait and mobility  Other lack of coordination  Unsteadiness on feet  Repeated falls  Rationale for Evaluation and Treatment Rehabilitation  PERTINENT HISTORY: Patient is a 66 year old male with recent referral for abnomality of gait and recent liver transplant in November 2021. He has past medical history significant for Liver transplant (02/18/2020), CKD, End stage  renal disease- On hemodialyis T/TH/Sat., Anemia, DM with neuropathy, Non-stemi, Coronary artery disease, Feeding tube with poor appetite, PE now on eliquis   PRECAUTIONS: none  SUBJECTIVE: Patient reports feeling okay today without any major complaints.    PAIN:  Are you having pain? No     TODAY'S TREATMENT:  10/24/21:  Review of remainder goals:   5xSTS: 15.34 seconds   6MWT: 718' in 4 minutes and 45 sec. Had to discontinue due to fatigue.       Therex:    -Step tap with 3lb AW alt LE x 10 reps     -Scap retraction: Blue TB- x 10 reps      - Sit to stand x 10 reps   -Step tap with 3lb AW alt LE x 10 reps     -Scap retraction: Blue TB- x 10 reps    - Sit to stand x 10 reps   - Step tap with 3lb AW alt LE x 8 reps   - Scap retraction: Blue TB x 8 reps    - Alternating seated marches 3# AW x10 reps    - B shoulder flexion 1.5 # AW's around wrists x10 reps    - B bicep curls neutral grip 1.5 # AW's around wrists x10 reps       Education provided throughout session via VC/TC and demonstration to facilitate movement at target joints and correct muscle activation for all testing and exercises performed.        PATIENT EDUCATION: Education details: Exercise technique, progress towards remainder goals Person educated: Patient Education method: Explanation, Demonstration, Tactile cues, and Verbal cues Education comprehension: verbalized understanding, returned demonstration, verbal cues required, tactile cues required, and needs further education   HOME EXERCISE PROGRAM: No updates   PT Short Term Goals -      PT SHORT TERM GOAL #1   Title Pt will be independent with HEP in order to improve strength and balance in order to decrease fall risk and improve function at home and work.    Baseline 02/02/2021: Patient reports limited HEP in place currently from [facility] agency 12/14 compliance; 1/20/2023Patient states no questions regarding his current exercise regimen and  states he is compliant.    Time 6    Period Weeks    Status Achieved    Target Date 03/16/21              PT Long Term Goals -       PT LONG TERM GOAL #1   Title Pt will improve FOTO to target score of 50 to display perceived improvements in ability to complete ADL's.    Baseline 02/02/2021= 43 12/143: 51%; 04/29/2021= 53%; 09/09/2021= 53; 10/12/2021=54%   Time 12    Period Weeks    Status Achieved      PT LONG TERM GOAL #2   Title Pt will decrease 5TSTS by at least 8 seconds in order to demonstrate clinically significant improvement in LE strength.    Baseline 02/02/2021= 28.22 sec with BUE Support 12/14: 23.1 seconds; 04/29/2021=Unable to  test today due to patient became nauseous with 6 min walk test and attempted TUG- unable to continue- will reassess next 1-2 visits; 06/08/2021= 25.1 sec with min BUE support; 07/20/2021= 17.75 with min BUE support    Time 12    Period Weeks    Status Achieved    Target Date 07/22/21      PT LONG TERM GOAL #3   Title Pt will decrease TUG to below 19 seconds/decrease in order to demonstrate decreased fall risk.    Baseline 02/02/2021= 27.30 sec without AD 12/14: 23 seconds; 04/29/2021- Unable to assess as patient became very nauseated upon standing and request to stop treatment. 06/08/2021= 19.10 sec without an AD; 07/20/2021= 15.68 sec without UE Support    Time 12    Period Weeks    Status Achieved    Target Date 07/22/21      PT LONG TERM GOAL #4   Title Pt will increase 6MWT by at least 34m(1640f in order to demonstrate clinically significant improvement in cardiopulmonary endurance and community ambulation    Baseline 02/02/2021= 83 feet in 1 min 10 sec wihtout an AD 12/14: 200 ft in  38m45mtes; 04/29/2021=Patient ambulated approx 60 feet yet had to stop due onset of nausea and unable to complete today; 07/15/2021= 330 feet in 2 min 25 sec without AD.  09/09/2021= 350 feet in 3 min 45 sec. 10/12/2021= 710 feet in 6 min- no AD (Short stand rest break)     Time 12    Period Weeks    Status Achieved   Target Date 07/22/21      PT LONG TERM GOAL #5   Title Pt will increase 10MWT by at least 0.2 m/s in order to demonstrate clinically significant improvement in community ambulation.    Baseline 02/02/2021= 0.42 m/s 12/14: 0.49 m/s; 04/29/2021=Patient unable to test today secondary to being nauseated. 06/08/2021= 0.62 m/s; 07/20/2021= 0.87 m/s    Time 12    Period Weeks    Status Achieved    Target Date 07/22/21      PT LONG TERM GOAL #6   Title Pt will decrease 5TSTS by at least 3 seconds in order to demonstrate clinically significant improvement in LE strength.    Baseline 07/20/2021= 17.75 sec with Minimal BUE Support; 09/09/2021= 17.50 sec with very minimal UE Support. 10/12/2021= 14.90 sec with min BUE Support.    Time 12    Period Weeks    Status Ongoing   Target Date 10/12/21    PT LONG TERM GOAL #7 Title:  Pt will increase 6MWT by at least 18m62m4ft52f order to demonstrate clinically significant improvement in cardiopulmonary endurance and community ambulation                                                       Baseline: 10/12/2021= 710 feet Goal status: INITIAL              Plan -      Clinical Impression Statement Pt on 50th visit leading to progress note. Pt performing 5xSTS slightly slower than at re-eval relying on light BUE support on arm rests. With 6MWT, pt unable to complete full 6 minutes, but ambulated further than pt did completing 6MWT on RECERT. Pt still requires frequent, seated rest breaks for recovery but based off of  6MWT is improving in stamina and tolerance for physical activity. Patient's condition has the potential to improve in response to therapy. Maximum improvement is yet to be obtained. The anticipated improvement is attainable and reasonable in a generally predictable time. Will continue per POC.     Personal Factors and Comorbidities Comorbidity 3+    Comorbidities DM, Liver transplant, ESRD, CAD, feeding  tube    Examination-Activity Limitations Carry;Lift;Reach Overhead;Squat;Stairs;Stand;Transfers;Toileting    Examination-Participation Restrictions Cleaning;Community Activity;Driving;Medication Management;Meal Prep;Yard Work    [employer]chant [employer] officer Evolving/Moderate complexity    Rehab Potential Fair    PT Frequency 2x / week    PT Duration 12 weeks    PT Treatment/Interventions ADLs/Self Care Home Management;Cryotherapy;Moist Heat;DME Instruction;Gait training;Stair training;Functional mobility training;Therapeutic activities;Therapeutic exercise;Balance training;Neuromuscular re-education;Patient/family education;Wheelchair mobility training;Manual techniques;Passive range of motion;Dry needling;Energy conservation;Joint Manipulations    PT Next Visit Plan Progressive Therapeutic exercises, Balance training, transfer/gait training    PT Home Exercise Plan No changes or updates today.    Consulted and Agree with Plan of Care Patient              Salem Caster. Fairly IV, PT, DPT Physical Therapist- [facility]  10/24/2021, 1:43 PM

## 2021-10-26 ENCOUNTER — Ambulatory Visit: Payer: Medicare Other

## 2021-10-26 DIAGNOSIS — R262 Difficulty in walking, not elsewhere classified: Secondary | ICD-10-CM

## 2021-10-26 DIAGNOSIS — R269 Unspecified abnormalities of gait and mobility: Secondary | ICD-10-CM

## 2021-10-26 DIAGNOSIS — R278 Other lack of coordination: Secondary | ICD-10-CM

## 2021-10-26 DIAGNOSIS — M6281 Muscle weakness (generalized): Secondary | ICD-10-CM

## 2021-10-26 DIAGNOSIS — R2689 Other abnormalities of gait and mobility: Secondary | ICD-10-CM

## 2021-10-26 DIAGNOSIS — R2681 Unsteadiness on feet: Secondary | ICD-10-CM

## 2021-10-26 NOTE — Therapy (Signed)
OUTPATIENT PHYSICAL THERAPY TREATMENT NOTE   Patient Name: Alex Hampton MRN: [id number] DOB:1955-12-13, 66 y.o., male Today's Date: 10/26/2021  PCP: Dr. Juluis Pitch REFERRING PROVIDER: Eulis Canner, MD   PT End of Session - 10/26/21 1310     Visit Number 51    Number of Visits 48    Date for PT Re-Evaluation 01/04/22    Authorization Type Medicare/BCBS; PN 03/23/21    Authorization Time Period Initial Cert= 97/98/9211- 94/17/4081; Recert 4/48/1856-06/21/9700: Recert 6/37/8588- 5/0/2774: Recert 04/11/8784- 7/67/2094    Progress Note Due on Visit 50    PT Start Time 7096    PT Stop Time 1334    PT Time Calculation (min) 29 min    Equipment Utilized During Treatment Gait belt    Activity Tolerance Patient limited by fatigue;Patient tolerated treatment well;No increased pain    Behavior During Therapy WFL for tasks assessed/performed             Past Medical History:  Diagnosis Date   Depression    History of cardiac cath    History of heart artery stent    Hyperlipidemia    Hypertension    MI (myocardial infarction) ([place])    Renal disorder    Past Surgical History:  Procedure Laterality Date   IR [place] TUBE CHANGE  11/22/2020   IR [place] TUBE CHANGE  11/26/2020   IR [place] GASTRO/COLONIC TUBE PERCUT W/FLUORO  10/14/2020   LEFT HEART CATH AND CORONARY ANGIOGRAPHY N/A 08/19/2020   Procedure: LEFT HEART CATH AND CORONARY ANGIOGRAPHY;  Surgeon: Corey Skains, MD;  Location: [place] [facility];  Service: Cardiovascular;  Laterality: N/A;   LIVER TRANSPLANT     Patient Active Problem List   Diagnosis Date Noted   Pulmonary embolism ([place]) 08/08/2021   CAP (community acquired pneumonia) 08/08/2021   Protein calorie malnutrition ([place]) 08/08/2021   Hypokalemia 08/08/2021   Pancreatic cyst    Verbal auditory hallucination    Early satiety    Feeding tube dysfunction    Generalized weakness 11/26/2020   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[place]) 11/26/2020    Recurrent falls 11/26/2020   Syncope 10/15/2020   Uremia 10/10/2020   ESRD on hemodialysis ([place]) 10/10/2020   Hyperkalemia 10/10/2020   Elevated troponin 10/10/2020   GERD (gastroesophageal reflux disease) 10/10/2020   CAD (coronary artery disease) 10/10/2020   G tube feedings ([place]) 10/10/2020   Diarrhea 10/10/2020   Diabetes mellitus ([place]) 10/10/2020   Weakness    ACS (acute coronary syndrome) ([place])    Liver transplant recipient [facility])    Anemia of chronic disease    Type 2 diabetes mellitus with diabetic neuropathy, with long-term current use of insulin ([place])    ESRD (end stage renal disease) ([place]) 08/19/2020   History of anemia due to chronic kidney disease 08/19/2020   Gait abnormality 10/17/2019   History of fall within past 90 days 10/17/2019   Visual hallucination 10/17/2019   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 10/17/2019   HTN (hypertension) 10/17/2019   History of MI (myocardial infarction) 10/17/2019   OSA (obstructive sleep apnea) 10/17/2019   Nondisplaced comminuted supracondylar fracture without intercondylar fracture of left humerus, subsequent encounter for fracture with nonunion 10/17/2019   Anxiety and depression 10/17/2019    REFERRING DIAG: Other malaise Z94.4 (ICD-10-CM) - Liver transplant status R26.9 (ICD-10-CM) - Unspecified abnormalities of gait and mobility   THERAPY DIAG:  Abnormality of gait and mobility  Difficulty in walking, not elsewhere classified  Muscle weakness (  generalized)  Other abnormalities of gait and mobility  Other lack of coordination  Unsteadiness on feet  Rationale for Evaluation and Treatment Rehabilitation  PERTINENT HISTORY: Patient is a 66 year old male with recent referral for abnomality of gait and recent liver transplant in November 2021. He has past medical history significant for Liver transplant (02/18/2020), CKD, End stage renal disease- On hemodialyis T/TH/Sat., Anemia, DM with neuropathy, Non-stemi,  Coronary artery disease, Feeding tube with poor appetite, PE now on eliquis   PRECAUTIONS: none  SUBJECTIVE: Patient reports having a really good day. Mild soreness in back from last session.    PAIN:  Are you having pain? No     TODAY'S TREATMENT:  10/26/21:     Therex: -Amb 150' with 3# AW's                         -Step tap with 3lb AW alt LE x 10 reps                                                -Scap retraction: Blue TB- x 10 reps  -Amb 150' with 3# AW's                         -Step tap with 3lb AW alt LE x 10 reps                                                -Scap retraction: Blue TB- x 10 reps                                                   - Sit to stand x 6 reps 1 KG ball SUE support on chair rest                         -Step tap with 3lb AW alt LE x 10 reps                                                -Scap retraction: Blue TB- x 10 reps                            - Sit to stand x 6 reps 1 KG ball SUE support on chair rest                         - Step tap with 3lb AW alt LE x 10 reps                         - Scap retraction: Blue TB x 10 reps                             -  Alternating LAQ's 3# AW x10 reps                          - B shoulder flexion 1.5 # AW's around wrists x10 reps                          - B bicep curls neutral grip 1.5 # AW's around wrists x10 reps                             - Alternating LAQ's 3# AW x10 reps                          - B shoulder flexion 1.5 # AW's around wrists x10 reps                          - B bicep curls neutral grip 1.5 # AW's around wrists x10 reps          Education provided throughout session via VC/TC and demonstration to facilitate movement at target joints and correct muscle activation for all testing and exercises performed.        PATIENT EDUCATION: Education details: Exercise technique, progress towards remainder goals Person educated: Patient Education method: Explanation,  Demonstration, Tactile cues, and Verbal cues Education comprehension: verbalized understanding, returned demonstration, verbal cues required, tactile cues required, and needs further education   HOME EXERCISE PROGRAM: No updates   PT Short Term Goals -      PT SHORT TERM GOAL #1   Title Pt will be independent with HEP in order to improve strength and balance in order to decrease fall risk and improve function at home and work.    Baseline 02/02/2021: Patient reports limited HEP in place currently from [facility] agency 12/14 compliance; 1/20/2023Patient states no questions regarding his current exercise regimen and states he is compliant.    Time 6    Period Weeks    Status Achieved    Target Date 03/16/21              PT Long Term Goals -       PT LONG TERM GOAL #1   Title Pt will improve FOTO to target score of 50 to display perceived improvements in ability to complete ADL's.    Baseline 02/02/2021= 43 12/143: 51%; 04/29/2021= 53%; 09/09/2021= 53; 10/12/2021=54%   Time 12    Period Weeks    Status Achieved      PT LONG TERM GOAL #2   Title Pt will decrease 5TSTS by at least 8 seconds in order to demonstrate clinically significant improvement in LE strength.    Baseline 02/02/2021= 28.22 sec with BUE Support 12/14: 23.1 seconds; 04/29/2021=Unable to test today due to patient became nauseous with 6 min walk test and attempted TUG- unable to continue- will reassess next 1-2 visits; 06/08/2021= 25.1 sec with min BUE support; 07/20/2021= 17.75 with min BUE support    Time 12    Period Weeks    Status Achieved    Target Date 07/22/21      PT LONG TERM GOAL #3   Title Pt will decrease TUG to below 19 seconds/decrease in order to demonstrate decreased fall risk.    Baseline 02/02/2021= 27.30 sec without AD 12/14: 23 seconds; 04/29/2021- Unable to assess as patient  became very nauseated upon standing and request to stop treatment. 06/08/2021= 19.10 sec without an AD; 07/20/2021= 15.68 sec without UE  Support    Time 12    Period Weeks    Status Achieved    Target Date 07/22/21      PT LONG TERM GOAL #4   Title Pt will increase 6MWT by at least 7m(1643f in order to demonstrate clinically significant improvement in cardiopulmonary endurance and community ambulation    Baseline 02/02/2021= 83 feet in 1 min 10 sec wihtout an AD 12/14: 200 ft in  15m70mtes; 04/29/2021=Patient ambulated approx 60 feet yet had to stop due onset of nausea and unable to complete today; 07/15/2021= 330 feet in 2 min 25 sec without AD.  09/09/2021= 350 feet in 3 min 45 sec. 10/12/2021= 710 feet in 6 min- no AD (Short stand rest break)    Time 12    Period Weeks    Status Achieved   Target Date 07/22/21      PT LONG TERM GOAL #5   Title Pt will increase 10MWT by at least 0.2 m/s in order to demonstrate clinically significant improvement in community ambulation.    Baseline 02/02/2021= 0.42 m/s 12/14: 0.49 m/s; 04/29/2021=Patient unable to test today secondary to being nauseated. 06/08/2021= 0.62 m/s; 07/20/2021= 0.87 m/s    Time 12    Period Weeks    Status Achieved    Target Date 07/22/21      PT LONG TERM GOAL #6   Title Pt will decrease 5TSTS by at least 3 seconds in order to demonstrate clinically significant improvement in LE strength.    Baseline 07/20/2021= 17.75 sec with Minimal BUE Support; 09/09/2021= 17.50 sec with very minimal UE Support. 10/12/2021= 14.90 sec with min BUE Support.    Time 12    Period Weeks    Status Ongoing   Target Date 10/12/21    PT LONG TERM GOAL #7 Title:  Pt will increase 6MWT by at least 58m74m4ft51f order to demonstrate clinically significant improvement in cardiopulmonary endurance and community ambulation                                                       Baseline: 10/12/2021= 710 feet Goal status: INITIAL              Plan -      Clinical Impression Statement Pt continuing circuit training for overall endurance and strengthening. Pt demonstrating decreased length of  seated rest between exercises and circuits. However overall session limited due to pt reporting upset in GI tract requesting to end session early. Pt will continue to benefit from skilled PT services to progress overall tolerance and endurance for standing and walking tasks.     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    Rehab Potential Fair    PT Frequency 2x / week    PT Duration 12 weeks    PT Treatment/Interventions ADLs/Self Care Home Management;Cryotherapy;Moist Heat;DME Instruction;Gait training;Stair training;Functional mobility training;Therapeutic activities;Therapeutic exercise;Balance training;Neuromuscular re-education;Patient/family education;Wheelchair mobility training;Manual techniques;Passive range of motion;Dry needling;Energy conservation;Joint Manipulations    PT Next  Visit Plan Progressive Therapeutic exercises, Balance training, transfer/gait training    PT Home Exercise Plan No changes or updates today.    Consulted and Agree with Plan of Care Patient              Salem Caster. Fairly IV, PT, DPT Physical Therapist- [facility]  10/26/2021, 1:41 PM

## 2021-10-31 ENCOUNTER — Ambulatory Visit: Payer: Medicare Other

## 2021-10-31 DIAGNOSIS — R262 Difficulty in walking, not elsewhere classified: Secondary | ICD-10-CM

## 2021-10-31 DIAGNOSIS — R2681 Unsteadiness on feet: Secondary | ICD-10-CM

## 2021-10-31 DIAGNOSIS — R278 Other lack of coordination: Secondary | ICD-10-CM

## 2021-10-31 DIAGNOSIS — M6281 Muscle weakness (generalized): Secondary | ICD-10-CM

## 2021-10-31 DIAGNOSIS — R2689 Other abnormalities of gait and mobility: Secondary | ICD-10-CM

## 2021-10-31 DIAGNOSIS — R269 Unspecified abnormalities of gait and mobility: Secondary | ICD-10-CM | POA: Diagnosis not present

## 2021-10-31 NOTE — Therapy (Signed)
OUTPATIENT PHYSICAL THERAPY TREATMENT NOTE   Patient Name: Alex Hampton MRN: [id number] DOB:1955/09/01, 66 y.o., male Today's Date: 10/31/2021  PCP: Dr. Juluis Pitch REFERRING PROVIDER: Eulis Canner, MD   PT End of Session - 10/31/21 1306     Visit Number 85    Number of Visits 48    Date for PT Re-Evaluation 01/04/22    Authorization Type Medicare/BCBS; PN 03/23/21    Authorization Time Period Initial Cert= 88/50/2774- 12/87/8676; Recert 10/27/9468-9/62/8366: Recert 2/94/7654- 09/12/352: Recert 09/13/6810- 7/51/7001    Progress Note Due on Visit 50    PT Start Time 7494    PT Stop Time 1345    PT Time Calculation (min) 40 min    Equipment Utilized During Treatment Gait belt    Activity Tolerance Patient limited by fatigue;Patient tolerated treatment well;No increased pain    Behavior During Therapy WFL for tasks assessed/performed             Past Medical History:  Diagnosis Date   Depression    History of cardiac cath    History of heart artery stent    Hyperlipidemia    Hypertension    MI (myocardial infarction) ([place])    Renal disorder    Past Surgical History:  Procedure Laterality Date   IR [facility] TUBE CHANGE  11/22/2020   IR [place] TUBE CHANGE  11/26/2020   IR [place] GASTRO/COLONIC TUBE PERCUT W/FLUORO  10/14/2020   LEFT HEART CATH AND CORONARY ANGIOGRAPHY N/A 08/19/2020   Procedure: LEFT HEART CATH AND CORONARY ANGIOGRAPHY;  Surgeon: Corey Skains, MD;  Location: [facility];  Service: Cardiovascular;  Laterality: N/A;   LIVER TRANSPLANT     Patient Active Problem List   Diagnosis Date Noted   Pulmonary embolism ([place]) 08/08/2021   CAP (community acquired pneumonia) 08/08/2021   Protein calorie malnutrition ([place]) 08/08/2021   Hypokalemia 08/08/2021   Pancreatic cyst    Verbal auditory hallucination    Early satiety    Feeding tube dysfunction    Generalized weakness 11/26/2020   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[place]) 11/26/2020    Recurrent falls 11/26/2020   Syncope 10/15/2020   Uremia 10/10/2020   ESRD on hemodialysis ([place]) 10/10/2020   Hyperkalemia 10/10/2020   Elevated troponin 10/10/2020   GERD (gastroesophageal reflux disease) 10/10/2020   CAD (coronary artery disease) 10/10/2020   G tube feedings ([place]) 10/10/2020   Diarrhea 10/10/2020   Diabetes mellitus ([place]) 10/10/2020   Weakness    ACS (acute coronary syndrome) ([place])    Liver transplant recipient 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 10/17/2019   HTN (hypertension) 10/17/2019   History of MI (myocardial infarction) 10/17/2019   OSA (obstructive sleep apnea) 10/17/2019   Nondisplaced comminuted supracondylar fracture without intercondylar fracture of left humerus, subsequent encounter for fracture with nonunion 10/17/2019   Anxiety and depression 10/17/2019    REFERRING DIAG: Other malaise Z94.4 (ICD-10-CM) - Liver transplant status R26.9 (ICD-10-CM) - Unspecified abnormalities of gait and mobility   THERAPY DIAG:  Difficulty in walking, not elsewhere classified  Abnormality of gait and mobility  Muscle weakness (  generalized)  Other abnormalities of gait and mobility  Other lack of coordination  Unsteadiness on feet  Rationale for Evaluation and Treatment Rehabilitation  PERTINENT HISTORY: Patient is a 66 year old male with recent referral for abnomality of gait and recent liver transplant in November 2021. He has past medical history significant for Liver transplant (02/18/2020), CKD, End stage renal disease- On hemodialyis T/TH/Sat., Anemia, DM with neuropathy, Non-stemi,  Coronary artery disease, Feeding tube with poor appetite, PE now on eliquis   PRECAUTIONS: none  SUBJECTIVE: Patient reports stomach is upset but nothing out of the ordinary and agreeable to work.  PAIN:  Are you having pain? No     TODAY'S TREATMENT:  10/31/2021    Therex: -Bicep curl with 4lb bar- 2 sets of 12 reps -Short arc quad alt LE with 3lb AW - 2 sets of 12 reps -Amb 150' with 3# AW's -Sit to stand with BUE Support x 5 reps                           -seated march with 3lb AW alt LE 2 sets x 12 reps                                                -Scap retraction: Blue TB- 2 sets x 12 reps   -Amb 150' with 3# AW's   -Sit to stand with BUE support x 4 reps                             -Step tap onto 1st step without UE support with 3lb AW alt LE x 12 reps                                                -Shoulder flex (below 90 deg) using 4lb bar x 10   -Amb 150' with 3# AW's   -Sit to stand with BUE support x 3 reps                                                   -Seated hip abd 3# AW 2 x 12 reps    -Seated tricep press down with BTB x 12 reps   -Ambulation 150 feet with 3lb AW   -Sit to stand with BUE Support x 2 reps                          -Stand Calf raises x 12   - slouched sitting in chair- incline bench press using 4lb bar x 12 reps    - Ambulation 150 feet with 3lb AW   -Sit to stand with BUE Support x 1 reps  Education provided throughout session via VC/TC and demonstration to facilitate movement at target joints and correct muscle activation for all testing and exercises performed.        PATIENT EDUCATION: Education details: Exercise technique, progress towards remainder goals Person educated: Patient Education method: Explanation, Demonstration, Tactile cues, and Verbal cues Education comprehension: verbalized understanding, returned  demonstration, verbal cues required, tactile cues required, and needs further education   HOME EXERCISE PROGRAM: No updates   PT Short Term Goals -      PT SHORT TERM GOAL #1   Title Pt will be independent with HEP in order to improve strength and balance in order to decrease fall risk and improve function at home and work.    Baseline 02/02/2021: Patient reports limited [facility]P [facility] 12/[facility] compliance; 1/20/2023Patient states no questions regarding his current exercise regimen and states he is compliant.    Time 6    Period Weeks    Status Achieved    Target Date 03/16/21              PT Long Term Goals -       PT LONG TERM GOAL #1   Title Pt will improve FOTO to target score of 50 to display perceived improvements in ability to complete ADL's.    Baseline 02/02/2021= 43 12/143: 51%; 04/29/2021= 53%; 09/09/2021= 53; 10/12/2021=54%   Time 12    Period Weeks    Status Achieved      PT LONG TERM GOAL #2   Title Pt will decrease 5TSTS by at least 8 seconds in order to demonstrate clinically significant improvement in LE strength.    Baseline 02/02/2021= 28.22 sec with BUE Support 12/14: 23.1 seconds; 04/29/2021=Unable to test today due to patient became nauseous with 6 min walk test and attempted TUG- unable to continue- will reassess next 1-2 visits; 06/08/2021= 25.1 sec with min BUE support; 07/20/2021= 17.75 with min BUE support    Time 12    Period Weeks    Status Achieved    Target Date 07/22/21      PT LONG TERM GOAL #3   Title Pt will decrease TUG to below 19 seconds/decrease in order to demonstrate decreased fall risk.    Baseline 02/02/2021= 27.30 sec without AD 12/14: 23 seconds; 04/29/2021- Unable to assess as patient became very nauseated upon standing and request to stop treatment. 06/08/2021= 19.10 sec without an AD; 07/20/2021= 15.68 sec without UE Support    Time 12    Period Weeks    Status Achieved    Target Date 07/22/21      PT LONG TERM  GOAL #4   Title Pt will increase 6MWT by at least 49m(1623f in order to demonstrate clinically significant improvement in cardiopulmonary endurance and community ambulation    Baseline 02/02/2021= 83 feet in 1 min 10 sec wihtout an AD 12/14: 200 ft in  64m1mtes; 04/29/2021=Patient ambulated approx 60 feet yet had to stop due onset of nausea and unable to complete today; 07/15/2021= 330 feet in 2 min 25 sec without AD.  09/09/2021= 350 feet in 3 min 45 sec. 10/12/2021= 710 feet in 6 min- no AD (Short stand rest break)    Time 12    Period Weeks    Status Achieved   Target Date 07/22/21      PT LONG TERM GOAL #5   Title Pt will increase 10MWT by at least 0.2 m/s in order to demonstrate clinically significant improvement  in community ambulation.    Baseline 02/02/2021= 0.42 m/s 12/14: 0.49 m/s; 04/29/2021=Patient unable to test today secondary to being nauseated. 06/08/2021= 0.62 m/s; 07/20/2021= 0.87 m/s    Time 12    Period Weeks    Status Achieved    Target Date 07/22/21      PT LONG TERM GOAL #6   Title Pt will decrease 5TSTS by at least 3 seconds in order to demonstrate clinically significant improvement in LE strength.    Baseline 07/20/2021= 17.75 sec with Minimal BUE Support; 09/09/2021= 17.50 sec with very minimal UE Support. 10/12/2021= 14.90 sec with min BUE Support.    Time 12    Period Weeks    Status Ongoing   Target Date 10/12/21    PT LONG TERM GOAL #7 Title:  Pt will increase 6MWT by at least 84m(1675f in order to demonstrate clinically significant improvement in cardiopulmonary endurance and community ambulation                                                       Baseline: 10/12/2021= 710 feet Goal status: INITIAL              Plan -      Clinical Impression Statement Pt performed well again with circuit style workout targeting UE/LE and overall endurance. He was able to progress with increased ambulation and increased reps as well as progression of therex. No worsening of  stomach and patient able to participate for full session today. Pt will continue to benefit from skilled PT services to progress overall tolerance and endurance for standing and walking tasks.     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 officervolving/Moderate complexity    Rehab Potential Fair    PT Frequency 2x / week    PT Duration 12 weeks    PT Treatment/Interventions ADLs/Self Care Home Management;Cryotherapy;Moist Heat;DME Instruction;Gait training;Stair training;Functional mobility training;Therapeutic activities;Therapeutic exercise;Balance training;Neuromuscular re-education;Patient/family education;Wheelchair mobility training;Manual techniques;Passive range of motion;Dry needling;Energy conservation;Joint Manipulations    PT Next Visit Plan Progressive Therapeutic exercises, Balance training, transfer/gait training    PT Home Exercise Plan No changes or updates today.    Consulted and Agree with Plan of Care Patient              JeOllen BowlPT Physical Therapist- [facility]7/24/2023, 1:58 PM

## 2021-11-02 ENCOUNTER — Ambulatory Visit: Payer: Medicare Other

## 2021-11-07 ENCOUNTER — Encounter: Payer: Self-pay | Admitting: Dermatology

## 2021-11-07 ENCOUNTER — Ambulatory Visit: Payer: Medicare Other | Admitting: Physical Therapy

## 2021-11-07 ENCOUNTER — Ambulatory Visit (INDEPENDENT_AMBULATORY_CARE_PROVIDER_SITE_OTHER): Payer: Medicare Other | Admitting: Dermatology

## 2021-11-07 DIAGNOSIS — D1800 Hemangioma unspecified site: Secondary | ICD-10-CM

## 2021-11-07 DIAGNOSIS — R2689 Other abnormalities of gait and mobility: Secondary | ICD-10-CM

## 2021-11-07 DIAGNOSIS — L814 Other melanin hyperpigmentation: Secondary | ICD-10-CM

## 2021-11-07 DIAGNOSIS — Z1283 Encounter for screening for malignant neoplasm of skin: Secondary | ICD-10-CM | POA: Diagnosis not present

## 2021-11-07 DIAGNOSIS — R269 Unspecified abnormalities of gait and mobility: Secondary | ICD-10-CM

## 2021-11-07 DIAGNOSIS — L821 Other seborrheic keratosis: Secondary | ICD-10-CM

## 2021-11-07 DIAGNOSIS — L578 Other skin changes due to chronic exposure to nonionizing radiation: Secondary | ICD-10-CM

## 2021-11-07 DIAGNOSIS — R262 Difficulty in walking, not elsewhere classified: Secondary | ICD-10-CM

## 2021-11-07 DIAGNOSIS — L219 Seborrheic dermatitis, unspecified: Secondary | ICD-10-CM | POA: Diagnosis not present

## 2021-11-07 DIAGNOSIS — M6281 Muscle weakness (generalized): Secondary | ICD-10-CM

## 2021-11-07 DIAGNOSIS — D229 Melanocytic nevi, unspecified: Secondary | ICD-10-CM

## 2021-11-07 MED ORDER — KETOCONAZOLE 2 % EX CREA: 1.0000 | TOPICAL_CREAM | Freq: Every evening | CUTANEOUS | 11 refills | Status: AC

## 2021-11-07 MED ORDER — KETOCONAZOLE 2 % EX SHAM: MEDICATED_SHAMPOO | CUTANEOUS | 11 refills | Status: DC

## 2021-11-07 MED ORDER — HYDROCORTISONE 2.5 % EX CREA: TOPICAL_CREAM | Freq: Every day | CUTANEOUS | 11 refills | Status: DC

## 2021-11-07 NOTE — Progress Notes (Signed)
New Patient Visit  Subjective  Alex Hampton is a 66 y.o. male who presents for the following: New Patient (Initial Visit) (Tbse. Resports no hx of skin cancer. Has a few dry scaly patches at face and scalp. ). The patient presents for Total-Body Skin Exam (TBSE) for skin cancer screening and mole check.  The patient has spots, moles and lesions to be evaluated, some may be new or changing and the patient has concerns that these could be cancer.  The following portions of the chart were reviewed this encounter and updated as appropriate:   Tobacco  Allergies  Meds  Problems  Med Hx  Surg Hx  Fam Hx     Review of Systems:  No other skin or systemic complaints except as noted in HPI or Assessment and Plan.  Objective  Well appearing patient in no apparent distress; mood and affect are within normal limits.  A full examination was performed including scalp, head, eyes, ears, nose, lips, neck, chest, axillae, abdomen, back, buttocks, bilateral upper extremities, bilateral lower extremities, hands, feet, fingers, toes, fingernails, and toenails. All findings within normal limits unless otherwise noted below.  scalp and face Pink patches with greasy scale.    Assessment & Plan  Seborrheic dermatitis scalp and face  Seborrheic Dermatitis  -  is a chronic persistent rash characterized by pinkness and scaling most commonly of the mid face but also can occur on the scalp (dandruff), ears; mid chest, mid back and groin.  It tends to be exacerbated by stress and cooler weather.  People who have neurologic disease may experience new onset or exacerbation of existing seborrheic dermatitis.  The condition is not curable but treatable and can be controlled  Start Ketoconazole 2 % shampoo - apply three times per week, massage into scalp and leave in for a few minutes before rinsing out  11 rfs  Start Ketoconazole 2 % cream - apply topically to aa of face at qhs on MWF 11rfs   Start  Hydrocortisone 2.5 % cream - apply topically to aa of face qhs on T,Th,Sat  Topical steroids (such as triamcinolone, fluocinolone, fluocinonide, mometasone, clobetasol, halobetasol, betamethasone, hydrocortisone) can cause thinning and lightening of the skin if they are used for too long in the same area. Your physician has selected the right strength medicine for your problem and area affected on the body. Please use your medication only as directed by your physician to prevent side effects.   ketoconazole (NIZORAL) 2 % shampoo - scalp and face apply three times per week, massage into scalp and leave in for a few minutes before rinsing out ketoconazole (NIZORAL) 2 % cream - scalp and face Apply 1 Application topically at bedtime. Apply topically M,W, F at bedtime to face and scalp hydrocortisone 2.5 % cream - scalp and face Apply topically at bedtime. Apply to face and scalp use on T, Th, Sat  Lentigines - Scattered tan macules - Due to sun exposure - Benign-appearing, observe - Recommend daily broad spectrum sunscreen SPF 30+ to sun-exposed areas, reapply every 2 hours as needed. - Call for any changes Smooth white papule(s). milia Seborrheic Keratoses - Stuck-on, waxy, tan-brown papules and/or plaques  - Benign-appearing - Discussed benign etiology and prognosis. - Observe - Call for any changes  Melanocytic Nevi - Tan-brown and/or pink-flesh-colored symmetric macules and papules - Benign appearing on exam today - Observation - Call clinic for new or changing moles - Recommend daily use of broad spectrum spf 30+ sunscreen  to sun-exposed areas.   Hemangiomas - Red papules - Discussed benign nature - Observe - Call for any changes  Actinic Damage - Chronic condition, secondary to cumulative UV/sun exposure - diffuse scaly erythematous macules with underlying dyspigmentation - Recommend daily broad spectrum sunscreen SPF 30+ to sun-exposed areas, reapply every 2 hours as  needed.  - Staying in the shade or wearing long sleeves, sun glasses (UVA+UVB protection) and wide brim hats (4-inch brim around the entire circumference of the hat) are also recommended for sun protection.  - Call for new or changing lesions.  Skin cancer screening performed today. Return in about 1 year (around 11/08/2022) for TBSE.  IRuthell Rummage, CMA, am acting as scribe for Sarina Ser, MD. Documentation: I have reviewed the above documentation for accuracy and completeness, and I agree with the above.  Sarina Ser, MD

## 2021-11-07 NOTE — Patient Instructions (Addendum)
Seborrheic Dermatitis  What is seborrheic dermatitis? Seborrheic (say: seb-oh-ree-ick) dermatitis is a disease that causes flaking of the skin.  It usually affects the scalp.  In teenagers and adults, it is commonly called "dandruff".  In infants, it is referred to as "cradle cap".  Dandruff often appears as scaling on the scalp with or without redness.  On other parts of the body, seborrheic dermatitis tends to produce both redness and scaling.  Other common locations of seborrheic dermatitis include the central face, eyebrows, chest, and the creases of the arms, legs, and groin.  It often causes the skin to look a little greasy, scaly, or flaky. Seborrheic dermatitis can occur at any age.  It often comes and goes and may to be seasonally related, especially in the Northern climates.  What causes seborrheic dermatitis? The exact cause is not known, though yeast of the Malassezia species may be involved.  This organism is normally present on the skin in small numbers, but sometimes its numbers increase, especially in oily skin.  Treatments that reduce the yeast tend to improve seborrheic dermatitis.  How is seborrheic dermatitis treated? The treatment of seborrheic dermatitis depends on its location on the body and the person's age. Seborrheic dermatitis of the scalp (dandruff) in adults and teenagers is usually treated with a medicated shampoo.  Here is a list of the medications that help, and the over-counter shampoos that contain them: Salicylic acid (Neutrogena T/Sal, Sebulex, Scalpicin, Denorex Extra Strength) Zinc pyrithione (Head & Shoulders white bottle, Denorex Daily, DHS Zinc, Pantene Pro-V Pyrithione Zinc) Selenium sulfide (Head & Shoulders blue bottle, Selsun Blue, Exsel Lotion Shampoo, Glo-Sel) Yahoo tar (Neutrogena T/Gal, Pentrax, Zetar, Tegrin, Viacom, Therapeutic Denorex) Ketoconazole (Nizoral)  If you have dandruff, you might start by using one of these shampoos every day until  your dandruff is controlled and then keep using it at least twice a week.  Often times your doctor will recommend a rotation of several different medicated shampoos as some will experience a plateau in the effectiveness of any one shampoo.   When you use a dandruff shampoo, rub the shampoo into your wet hair and massage into scalp thoroughly.  Let it stay on your hair and scalp for 5 minutes before rinsing.  If you have involvement in the eyebrows or face, you can lather those areas with the medicated shampoo as well, or use a medicated soap (ZNP-bar, Polytar Soap, SAStid, or sulfur soap).    If the wash or shampoo alone does not help, your doctor might want you to use a prescription medication once or twice a day.  Leave-in medications for the scalp are best applied by massaging into the scalp immediately after towel drying your hair, but may be applied even if you have not washed your hair.  Seborrheic dermatitis in infants usually clears up by age 30 -33 months.  It may develop in the diaper area where it might be confused with diaper rash.  For milder cases you can try gently brushing out scales with a soft brush.  This is best done immediately after washing with a non-medicated baby shampoo Wynetta Emery and Royce Macadamia, etc.).  Your doctor may recommend a medicated shampoo or a prescription topical medication.   Start Ketoconazole shampoo - use at affected areas of scalp 3 times weekly  Start Ketoconazole cream - use at affected areas of face and scalp at bedtime on Monday Wednesday and Friday  Start Hydrocortisone 2.5 % cream use at affected areas of face  and scalp at bedtime on Tuesday Thursday and Saturday     Melanoma ABCDEs  Melanoma is the most dangerous type of skin cancer, and is the leading cause of death from skin disease.  You are more likely to develop melanoma if you: Have light-colored skin, light-colored eyes, or red or blond hair Spend a lot of time in the sun Tan regularly,  either outdoors or in a tanning bed Have had blistering sunburns, especially during childhood Have a close family member who has had a melanoma Have atypical moles or large birthmarks  Early detection of melanoma is key since treatment is typically straightforward and cure rates are extremely high if we catch it early.   The first sign of melanoma is often a change in a mole or a new dark spot.  The ABCDE system is a way of remembering the signs of melanoma.  A for asymmetry:  The two halves do not match. B for border:  The edges of the growth are irregular. C for color:  A mixture of colors are present instead of an even brown color. D for diameter:  Melanomas are usually (but not always) greater than 72m - the size of a pencil eraser. E for evolution:  The spot keeps changing in size, shape, and color.  Please check your skin once per month between visits. You can use a small mirror in front and a large mirror behind you to keep an eye on the back side or your body.   If you see any new or changing lesions before your next follow-up, please call to schedule a visit.  Please continue daily skin protection including broad spectrum sunscreen SPF 30+ to sun-exposed areas, reapplying every 2 hours as needed when you're outdoors.   Staying in the shade or wearing long sleeves, sun glasses (UVA+UVB protection) and wide brim hats (4-inch brim around the entire circumference of the hat) are also recommended for sun protection.    Due to recent changes in healthcare laws, you may see results of your pathology and/or laboratory studies on MyChart before the doctors have had a chance to review them. We understand that in some cases there may be results that are confusing or concerning to you. Please understand that not all results are received at the same time and often the doctors may need to interpret multiple results in order to provide you with the best plan of care or course of treatment.  Therefore, we ask that you please give u[place]2 business days to thoroughly review all your results before contacting the office for clarification. Should we see a critical lab result, you will be contacted sooner.   If You Need Anything After Your Visit  If you have any questions or concerns for your doctor, please call our main line at [phone] press option 4 to reach your doctor's medical assistant. If no one answers, please leave a voicemail as directed and we will return your call as soon as possible. Messages left after 4 pm will be answered the following business day.   You may also send [place] message via [place] We typically respond to MyChart messages within 1-2 business days.  For prescription refills, please ask your pharmacy to contact our office. Our fax number is [phone]  If you have an urgent issue when the clinic is closed that cannot wait until the next business day, you can page your doctor at the number below.    Please note that while we  do our best to be available for urgent issues outside of office hours, we are not available 24/7.   If you have an urgent issue and are unable to reach [place], you may choose to seek medical care at your doctor's office, retail clinic, urgent care center, or emergency room.  If you have a medical emergency, please immediately call 911 or go to the emergency department.  Pager Numbers  - Dr. Nehemiah Massed: [phone]  - Dr. Laurence Ferrari: [phone]  - Dr. Nicole Kindred: [phone]  In the event of inclement weather, please call our main line at [phone] for an update on the status of any delays or closures.  Dermatology Medication Tips: Please keep the boxes that topical medications come in in order to help keep track of the instructions about where and how to use these. Pharmacies typically print the medication instructions only on the boxes and not directly on the medication tubes.   If your medication is too expensive, please  contact our office at [phone] option 4 or send [place] a message through [place].   We are unable to tell what your co-pay for medications will be in advance as this is different depending on your insurance coverage. However, we may be able to find a substitute medication at lower cost or fill out paperwork to get insurance to cover a needed medication.   If a prior authorization is required to get your medication covered by your insurance company, please allow [place] 1-2 business days to complete this process.  Drug prices often vary depending on where the prescription is filled and some pharmacies may offer cheaper prices.  The website www.goodrx.com contains coupons for medications through different pharmacies. The prices here do not account for what the cost may be with help from insurance (it may be cheaper with your insurance), but the website can give you the price if you did not use any insurance.  - You can print the associated coupon and take it with your prescription to the pharmacy.  - You may also stop by our office during regular business hours and pick up a GoodRx coupon card.  - If you need your prescription sent electronically to a different pharmacy, notify our office through [facility] or by phone at [phone] option 4.     Si Usted Necesita Algo Despus de Su Visita  Tambin puede enviarnos un mensaje a travs de Pharmacist, community. Por lo general respondemos a los mensajes de MyChart en el transcurso de 1 a 2 das hbiles.  Para renovar recetas, por favor pida a su farmacia que se ponga en contacto con nuestra oficina. Harland Dingwall de fax es [place] [phone].  Si tiene un asunto urgente cuando la clnica est cerrada y que no puede esperar hasta el siguiente da hbil, puede llamar/localizar a su doctor(a) al nmero que aparece a continuacin.   Por favor, tenga en cuenta que aunque hacemos todo lo posible para estar disponibles para asuntos urgentes fuera del horario de  [place], no estamos disponibles las 24 horas del da, los 7 das de la [place].   Si tiene un problema urgente y no puede comunicarse con nosotros, puede optar por buscar atencin mdica  en el consultorio de su doctor(a), en una clnica privada, en un centro de atencin urgente o en una sala de emergencias.  Si tiene Engineering geologist, por favor llame inmediatamente al 911 o vaya a la sala de emergencias.  Nmeros de bper  - Dr. Nehemiah Massed: [phone]  - Dra. Moye:  [phone]  - Dra. Nicole Kindred: [phone]  En caso de inclemencias del [place], por favor llame a [place]ie Kindred principal al [phone] para una actualizacin sobre el [place] de cualquier retraso o cierre.  Consejos para la medicacin en dermatologa: Por favor, guarde las cajas en las que vienen los medicamentos de uso tpico para ayudarle a seguir las instrucciones sobre dnde y cmo usarlos. Las farmacias generalmente imprimen las instrucciones del medicamento slo en las cajas y no directamente en los tubos del [place].   Si su medicamento es muy caro, por favor, pngase en contacto con Zigmund Daniel llamando al [phone] y presione la opcin 4 o envenos un mensaje a travs de Pharmacist, community.   No podemos decirle cul ser su copago por los medicamentos por adelantado ya que esto es diferente dependiendo de la cobertura de su seguro. Sin embargo, es posible que podamos encontrar un medicamento sustituto a Electrical engineer un formulario para que el seguro cubra el medicamento que se considera necesario.   Si se requiere una autorizacin previa para que su compaa de seguros [place] su medicamento, por favor permtanos de 1 a 2 das hbiles para completar este proceso.  Los precios de los medicamentos varan con frecuencia dependiendo del Environmental consultant de dnde se surte la receta y alguna farmacias pueden ofrecer precios ms baratos.  El sitio web www.goodrx.com tiene cupones para medicamentos de Airline pilot. Los  precios aqu no tienen en cuenta lo que podra costar con la ayuda del seguro (puede ser ms barato con su seguro), pero el sitio web puede darle el precio si no utiliz Research scientist (physical sciences).  - Puede imprimir el cupn correspondiente y llevarlo con su receta a la farmacia.  - Tambin puede pasar por nuestra oficina durante el horario de atencin regular y Charity fundraiser una tarjeta de cupones de GoodRx.  - Si necesita que su receta se enve electrnicamente a una farmacia diferente, informe a nuestra oficina a travs de MyChart de [place] o por telfono llamando al [phone] y presione la opcin 4.

## 2021-11-07 NOTE — Therapy (Signed)
OUTPATIENT PHYSICAL THERAPY TREATMENT NOTE   Patient Name: Alex Hampton MRN: [id number] DOB:1955/09/19, 66 y.o., male Today's Date: 11/07/2021  PCP: Dr. Juluis Hampton REFERRING PROVIDER: Eulis Canner, MD   PT End of Session - 11/07/21 1352     Visit Number 53    Number of Visits 76    Date for PT Re-Evaluation 01/04/22    Authorization Type Medicare/BCBS; PN 03/23/21    Authorization Time Period Initial Cert= 01/24/5101- 58/52/7782; Recert 08/01/5359-4/43/1540: Recert 0/86/7619- 5/0/9326: Recert 10/08/2456- 0/99/8338    Progress Note Due on Visit 50    PT Start Time 1302    PT Stop Time 1326    PT Time Calculation (min) 24 min    Equipment Utilized During Treatment Gait belt    Activity Tolerance Patient limited by fatigue;Patient tolerated treatment well;No increased pain    Behavior During Therapy WFL for tasks assessed/performed              Past Medical History:  Diagnosis Date   Depression    History of cardiac cath    History of heart artery stent    Hyperlipidemia    Hypertension    MI (myocardial infarction) ([place])    Renal disorder    Past Surgical History:  Procedure Laterality Date   IR [facility] TUBE CHANGE  11/22/2020   IR [place] TUBE CHANGE  11/26/2020   IR [place] GASTRO/COLONIC TUBE PERCUT W/FLUORO  10/14/2020   LEFT HEART CATH AND CORONARY ANGIOGRAPHY N/A 08/19/2020   Procedure: LEFT HEART CATH AND CORONARY ANGIOGRAPHY;  Surgeon: Corey Skains, MD;  Location: [facility];  Service: Cardiovascular;  Laterality: N/A;   LIVER TRANSPLANT     Patient Active Problem List   Diagnosis Date Noted   Pulmonary embolism ([place]) 08/08/2021   CAP (community acquired pneumonia) 08/08/2021   Protein calorie malnutrition ([place]) 08/08/2021   Hypokalemia 08/08/2021   Pancreatic cyst    Verbal auditory hallucination    Early satiety    Feeding tube dysfunction    Generalized weakness 11/26/2020   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[place]) 11/26/2020    Recurrent falls 11/26/2020   Syncope 10/15/2020   Uremia 10/10/2020   ESRD on hemodialysis ([place]) 10/10/2020   Hyperkalemia 10/10/2020   Elevated troponin 10/10/2020   GERD (gastroesophageal reflux disease) 10/10/2020   CAD (coronary artery disease) 10/10/2020   G tube feedings ([place]) 10/10/2020   Diarrhea 10/10/2020   Diabetes mellitus ([place]) 10/10/2020   Weakness    ACS (acute coronary syndrome) ([place])    Liver transplant recipient [facility])    Anemia of chronic disease    Type 2 diabetes mellitus with diabetic neuropathy, with long-term current use of insulin ([place])    ESRD (end stage renal disease) ([place]) 08/19/2020   History of anemia due to chronic kidney disease 08/19/2020   Gait abnormality 10/17/2019   History of fall within past 90 days 10/17/2019   Visual hallucination 10/17/2019   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 10/17/2019   HTN (hypertension) 10/17/2019   History of MI (myocardial infarction) 10/17/2019   OSA (obstructive sleep apnea) 10/17/2019   Nondisplaced comminuted supracondylar fracture without intercondylar fracture of left humerus, subsequent encounter for fracture with nonunion 10/17/2019   Anxiety and depression 10/17/2019    REFERRING DIAG: Other malaise Z94.4 (ICD-10-CM) - Liver transplant status R26.9 (ICD-10-CM) - Unspecified abnormalities of gait and mobility   THERAPY DIAG:  Difficulty in walking, not elsewhere classified  Abnormality of gait and mobility  Muscle  weakness (generalized)  Other abnormalities of gait and mobility  Rationale for Evaluation and Treatment Rehabilitation  PERTINENT HISTORY: Patient is a 66 year old male with recent referral for abnomality of gait and recent liver transplant in November 2021. He has past medical history significant for Liver transplant (02/18/2020), CKD, End stage renal disease- On hemodialyis T/TH/Sat., Anemia, DM with neuropathy, Non-stemi, Coronary artery disease, Feeding tube with poor appetite,  PE now on eliquis   PRECAUTIONS: none  SUBJECTIVE: Patient reports stomach is upset this date but not enough to prevent him from completing his PT. Pt also reports he has been reading a space opera book and has been enjoying it.  PAIN:  Are you having pain? No     TODAY'S TREATMENT:  11/07/21     Therex:     X 2 rounds of the following:  -Bicep curl with 4lb bar- 2 sets of 12 reps -Seated march with 3# AW -X 1 round ea of the following:  STS x 5  - Ambulaiton with 2.5 # AW x 160 feet   - minor stomach uneasiness following ambulation,                            -Seated LAQ caused some hip muscle discomfort, exercises ceased as a result at this time   - Seated clam shell GTB 2 x 10    - chest press 2 x 10 with 2 # bar   - Pt requests to end session early due to stomach issues and related weakness. Session ended early as a result.                             Pt required occasional rest breaks due fatigue, PT was quick to ask when pt appeared to be fatiguing in order to prevent excessive fatigue.   Education provided throughout session via VC/TC and demonstration to facilitate movement at target joints and correct muscle activation for all testing and exercises performed.        PATIENT EDUCATION: Education details: Exercise technique, progress towards remainder goals Person educated: Patient Education method: Explanation, Demonstration, Tactile cues, and Verbal cues Education comprehension: verbalized understanding, returned demonstration, verbal cues required, tactile cues required, and needs further education   HOME EXERCISE PROGRAM: No updates   PT Short Term Goals -      PT SHORT TERM GOAL #1   Title Pt will be independent with HEP in order to improve strength and balance in order to decrease fall risk and improve function at home and work.    Baseline 02/02/2021: Patient reports limited HEP in place currently from [facility] agency 12/14 compliance; 1/20/2023Patient  states no questions regarding his current exercise regimen and states he is compliant.    Time 6    Period Weeks    Status Achieved    Target Date 03/16/21              PT Long Term Goals -       PT LONG TERM GOAL #1   Title Pt will improve FOTO to target score of 50 to display perceived improvements in ability to complete ADL's.    Baseline 02/02/2021= 43 12/143: 51%; 04/29/2021= 53%; 09/09/2021= 53; 10/12/2021=54%   Time 12    Period Weeks    Status Achieved      PT LONG TERM GOAL #2   Title Pt will decrease  5TSTS by at least 8 seconds in order to demonstrate clinically significant improvement in LE strength.    Baseline 02/02/2021= 28.22 sec with BUE Support 12/14: 23.1 seconds; 04/29/2021=Unable to test today due to patient became nauseous with 6 min walk test and attempted TUG- unable to continue- will reassess next 1-2 visits; 06/08/2021= 25.1 sec with min BUE support; 07/20/2021= 17.75 with min BUE support    Time 12    Period Weeks    Status Achieved    Target Date 07/22/21      PT LONG TERM GOAL #3   Title Pt will decrease TUG to below 19 seconds/decrease in order to demonstrate decreased fall risk.    Baseline 02/02/2021= 27.30 sec without AD 12/14: 23 seconds; 04/29/2021- Unable to assess as patient became very nauseated upon standing and request to stop treatment. 06/08/2021= 19.10 sec without an AD; 07/20/2021= 15.68 sec without UE Support    Time 12    Period Weeks    Status Achieved    Target Date 07/22/21      PT LONG TERM GOAL #4   Title Pt will increase 6MWT by at least 52m(1664f in order to demonstrate clinically significant improvement in cardiopulmonary endurance and community ambulation    Baseline 02/02/2021= 83 feet in 1 min 10 sec wihtout an AD 12/14: 200 ft in  74m56mtes; 04/29/2021=Patient ambulated approx 60 feet yet had to stop due onset of nausea and unable to complete today; 07/15/2021= 330 feet in 2 min 25 sec without AD.  09/09/2021= 350 feet in 3 min 45 sec.  10/12/2021= 710 feet in 6 min- no AD (Short stand rest break)    Time 12    Period Weeks    Status Achieved   Target Date 07/22/21      PT LONG TERM GOAL #5   Title Pt will increase 10MWT by at least 0.2 m/s in order to demonstrate clinically significant improvement in community ambulation.    Baseline 02/02/2021= 0.42 m/s 12/14: 0.49 m/s; 04/29/2021=Patient unable to test today secondary to being nauseated. 06/08/2021= 0.62 m/s; 07/20/2021= 0.87 m/s    Time 12    Period Weeks    Status Achieved    Target Date 07/22/21      PT LONG TERM GOAL #6   Title Pt will decrease 5TSTS by at least 3 seconds in order to demonstrate clinically significant improvement in LE strength.    Baseline 07/20/2021= 17.75 sec with Minimal BUE Support; 09/09/2021= 17.50 sec with very minimal UE Support. 10/12/2021= 14.90 sec with min BUE Support.    Time 12    Period Weeks    Status Ongoing   Target Date 10/12/21    PT LONG TERM GOAL #7 Title:  Pt will increase 6MWT by at least 4m42m4ft32f order to demonstrate clinically significant improvement in cardiopulmonary endurance and community ambulation                                                       Baseline: 10/12/2021= 710 feet Goal status: INITIAL              Plan -      Clinical Impression Statement Continued with current plan of care as laid out in evaluation and recent prior sessions. Pt remains motivated to advance progress toward goals in order  to maximize independence and safety at home.  Patient had increased stomach issues today resulting in limited treatment time.  Patient requested to in treatment session early secondary to feelings of weakness as well as queasiness.  Author did attempt to perform as many seated exercises possible as patient reports frequently standing up and sitting down increases his symptoms.Pt will continue to benefit from skilled physical therapy intervention to address impairments, improve QOL, and attain therapy goals.      Personal Factors and Comorbidities Comorbidity 3+    Comorbidities DM, Liver transplant, ESRD, CAD, feeding tube    Examination-Activity Limitations Carry;Lift;Reach Overhead;Squat;Stairs;Stand;Transfers;Toileting    Examination-Participation Restrictions Cleaning;Community Activity;Driving;Medication Management;Meal Prep;Yard Work    Merchant navy officer Evolving/Moderate complexity    Rehab Potential Fair    PT Frequency 2x / week    PT Duration 12 weeks    PT Treatment/Interventions ADLs/Self Care Home Management;Cryotherapy;Moist Heat;DME Instruction;Gait training;Stair training;Functional mobility training;Therapeutic activities;Therapeutic exercise;Balance training;Neuromuscular re-education;Patient/family education;Wheelchair mobility training;Manual techniques;Passive range of motion;Dry needling;Energy conservation;Joint Manipulations    PT Next Visit Plan Progressive Therapeutic exercises, Balance training, transfer/gait training    PT Home Exercise Plan No changes or updates today.    Consulted and Agree with Plan of Care Patient              Particia Lather PT  11/07/2021, 1:57 PM

## 2021-11-09 ENCOUNTER — Ambulatory Visit: Payer: Medicare Other | Attending: Gastroenterology

## 2021-11-09 DIAGNOSIS — M6281 Muscle weakness (generalized): Secondary | ICD-10-CM | POA: Diagnosis present

## 2021-11-09 DIAGNOSIS — R2681 Unsteadiness on feet: Secondary | ICD-10-CM | POA: Insufficient documentation

## 2021-11-09 DIAGNOSIS — R269 Unspecified abnormalities of gait and mobility: Secondary | ICD-10-CM | POA: Insufficient documentation

## 2021-11-09 DIAGNOSIS — R262 Difficulty in walking, not elsewhere classified: Secondary | ICD-10-CM | POA: Diagnosis present

## 2021-11-09 DIAGNOSIS — R296 Repeated falls: Secondary | ICD-10-CM | POA: Diagnosis present

## 2021-11-09 DIAGNOSIS — R278 Other lack of coordination: Secondary | ICD-10-CM | POA: Diagnosis present

## 2021-11-09 DIAGNOSIS — R2689 Other abnormalities of gait and mobility: Secondary | ICD-10-CM | POA: Insufficient documentation

## 2021-11-09 NOTE — Therapy (Signed)
OUTPATIENT PHYSICAL THERAPY TREATMENT NOTE   Patient Name: Alex Hampton MRN: [id number] DOB:[birth year], 66 y.o., male Today's Date: 11/10/2021  PCP: Dr. Juluis Pitch REFERRING PROVIDER: Eulis Canner, MD   PT End of Session - 11/09/21 1259     Visit Number 58    Number of Visits 21    Date for PT Re-Evaluation 01/04/22    Authorization Type Medicare/BCBS; PN 03/23/21    Authorization Time Period Initial Cert= 85/05/7739- 28/78/6767; Recert 05/20/4707-10/05/3660: Recert 9/47/6546- 5/0/3546: Recert 08/13/8125- 08/25/15    Progress Note Due on Visit 60    PT Start Time 1300    PT Stop Time 1342    PT Time Calculation (min) 42 min    Equipment Utilized During Treatment Gait belt    Activity Tolerance Patient limited by fatigue;Patient tolerated treatment well;No increased pain    Behavior During Therapy WFL for tasks assessed/performed               Past Medical History:  Diagnosis Date   Depression    History of cardiac cath    History of heart artery stent    Hyperlipidemia    Hypertension    MI (myocardial infarction) ([place])    Renal disorder    Past Surgical History:  Procedure Laterality Date   IR [place] TUBE CHANGE  11/22/2020   IR [place] TUBE CHANGE  11/26/2020   IR [place] GASTRO/COLONIC TUBE PERCUT W/FLUORO  10/14/2020   LEFT HEART CATH AND CORONARY ANGIOGRAPHY N/A 08/19/2020   Procedure: LEFT HEART CATH AND CORONARY ANGIOGRAPHY;  Surgeon: Corey Skains, MD;  Location: [facility];  Service: Cardiovascular;  Laterality: N/A;   LIVER TRANSPLANT     Patient Active Problem List   Diagnosis Date Noted   Pulmonary embolism ([place]) 08/08/2021   CAP (community acquired pneumonia) 08/08/2021   Protein calorie malnutrition ([place]) 08/08/2021   Hypokalemia 08/08/2021   Pancreatic cyst    Verbal auditory hallucination    Early satiety    Feeding tube dysfunction    Generalized weakness 11/26/2020   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[place]) 11/26/2020    Recurrent falls 11/26/2020   Syncope 10/15/2020   Uremia 10/10/2020   ESRD on hemodialysis ([place]) 10/10/2020   Hyperkalemia 10/10/2020   Elevated troponin 10/10/2020   GERD (gastroesophageal reflux disease) 10/10/2020   CAD (coronary artery disease) 10/10/2020   G tube feedings ([place]) 10/10/2020   Diarrhea 10/10/2020   Diabetes mellitus ([place]) 10/10/2020   Weakness    ACS (acute coronary syndrome) ([place])    Liver transplant recipient [facility])    Anemia of chronic disease    Type 2 diabetes mellitus with diabetic neuropathy, with long-term current use of insulin ([place])    ESRD (end stage renal disease) ([place]) 08/19/2020   History of anemia due to chronic kidney disease 08/19/2020   Gait abnormality 10/17/2019   History of fall within past 90 days 10/17/2019   Visual hallucination 10/17/2019   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 10/17/2019   HTN (hypertension) 10/17/2019   History of MI (myocardial infarction) 10/17/2019   OSA (obstructive sleep apnea) 10/17/2019   Nondisplaced comminuted supracondylar fracture without intercondylar fracture of left humerus, subsequent encounter for fracture with nonunion 10/17/2019   Anxiety and depression 10/17/2019    REFERRING DIAG: Other malaise Z94.4 (ICD-10-CM) - Liver transplant status R26.9 (ICD-10-CM) - Unspecified abnormalities of gait and mobility   THERAPY DIAG:  Difficulty in walking, not elsewhere classified  Abnormality of gait and mobility  Muscle weakness (generalized)  Other abnormalities of gait and mobility  Other lack of coordination  Unsteadiness on feet  Rationale for Evaluation and Treatment Rehabilitation  PERTINENT HISTORY: Patient is a 66 year old male with recent referral for abnomality of gait and recent liver transplant in November 2021. He has past medical history significant for Liver transplant (02/18/2020), CKD, End stage renal disease- On hemodialyis T/TH/Sat., Anemia, DM with neuropathy, Non-stemi,  Coronary artery disease, Feeding tube with poor appetite, PE now on eliquis   PRECAUTIONS: none  SUBJECTIVE: Patient reports feeling better than He did on Monday. Denies any new issues.    PAIN:  Are you having pain? No     TODAY'S TREATMENT:  11/10/21   Therex:  Nustep BUE/LE for muscle strength and endurance L1 x  Total Time= 5 min Total distance= 0.12 mi  At // bars:   Stand calf raise BLE x 10 reps x 2 sets Stand step up/over 4 objects (pool noodles and foam rolls)  in // bars with min BUE Support - down and back x 3 each Seated scap retraction x 2 sets of 12 reps (BTB)  Sit to stand x 10 reps (min BUE Support)  Standing hip march in // bars against GTB  x 12 reps Seated Lumbar flex using big Blue Theraball  95 cm - x 12 reps Standing Hip abd using GTB x 12 reps each LE  Deadlift using pvc pipe - VC for correct technique- x 8 reps (patient reports as challenging)  Gait in clinic for approx 300 feet without AD for endurance. - Patient able to complete without any rest break                                 Pt required occasional rest breaks due fatigue, PT was quick to ask when pt appeared to be fatiguing in order to prevent excessive fatigue.   Education provided throughout session via VC/TC and demonstration to facilitate movement at target joints and correct muscle activation for all testing and exercises performed.        PATIENT EDUCATION: Education details: Exercise technique, progress towards remainder goals Person educated: Patient Education method: Explanation, Demonstration, Tactile cues, and Verbal cues Education comprehension: verbalized understanding, returned demonstration, verbal cues required, tactile cues required, and needs further education   HOME EXERCISE PROGRAM: No updates   PT Short Term Goals -      PT SHORT TERM GOAL #1   Title Pt will be independent with HEP in order to improve strength and balance in order to decrease fall  risk and improve function at home and work.    Baseline 02/02/2021: Patient reports limited HEP in place currently from [facility] agency 12/14 compliance; 1/20/2023Patient states no questions regarding his current exercise regimen and states he is compliant.    Time 6    Period Weeks    Status Achieved    Target Date 03/16/21              PT Long Term Goals -       PT LONG TERM GOAL #1   Title Pt will improve FOTO to target score of 50 to display perceived improvements in ability to complete ADL's.    Baseline 02/02/2021= 43 12/143: 51%; 04/29/2021= 53%; 09/09/2021= 53; 10/12/2021=54%   Time 12    Period Weeks    Status Achieved      PT LONG TERM GOAL #  2   Title Pt will decrease 5TSTS by at least 8 seconds in order to demonstrate clinically significant improvement in LE strength.    Baseline 02/02/2021= 28.22 sec with BUE Support 12/14: 23.1 seconds; 04/29/2021=Unable to test today due to patient became nauseous with 6 min walk test and attempted TUG- unable to continue- will reassess next 1-2 visits; 06/08/2021= 25.1 sec with min BUE support; 07/20/2021= 17.75 with min BUE support    Time 12    Period Weeks    Status Achieved    Target Date 07/22/21      PT LONG TERM GOAL #3   Title Pt will decrease TUG to below 19 seconds/decrease in order to demonstrate decreased fall risk.    Baseline 02/02/2021= 27.30 sec without AD 12/14: 23 seconds; 04/29/2021- Unable to assess as patient became very nauseated upon standing and request to stop treatment. 06/08/2021= 19.10 sec without an AD; 07/20/2021= 15.68 sec without UE Support    Time 12    Period Weeks    Status Achieved    Target Date 07/22/21      PT LONG TERM GOAL #4   Title Pt will increase 6MWT by at least 47m(1612f in order to demonstrate clinically significant improvement in cardiopulmonary endurance and community ambulation    Baseline 02/02/2021= 83 feet in 1 min 10 sec wihtout an AD 12/14: 200 ft in  52m24mtes; 04/29/2021=Patient ambulated  approx 60 feet yet had to stop due onset of nausea and unable to complete today; 07/15/2021= 330 feet in 2 min 25 sec without AD.  09/09/2021= 350 feet in 3 min 45 sec. 10/12/2021= 710 feet in 6 min- no AD (Short stand rest break)    Time 12    Period Weeks    Status Achieved   Target Date 07/22/21      PT LONG TERM GOAL #5   Title Pt will increase 10MWT by at least 0.2 m/s in order to demonstrate clinically significant improvement in community ambulation.    Baseline 02/02/2021= 0.42 m/s 12/14: 0.49 m/s; 04/29/2021=Patient unable to test today secondary to being nauseated. 06/08/2021= 0.62 m/s; 07/20/2021= 0.87 m/s    Time 12    Period Weeks    Status Achieved    Target Date 07/22/21      PT LONG TERM GOAL #6   Title Pt will decrease 5TSTS by at least 3 seconds in order to demonstrate clinically significant improvement in LE strength.    Baseline 07/20/2021= 17.75 sec with Minimal BUE Support; 09/09/2021= 17.50 sec with very minimal UE Support. 10/12/2021= 14.90 sec with min BUE Support.    Time 12    Period Weeks    Status Ongoing   Target Date 10/12/21    PT LONG TERM GOAL #7 Title:  Pt will increase 6MWT by at least 49m6m4ft39f order to demonstrate clinically significant improvement in cardiopulmonary endurance and community ambulation                                                       Baseline: 10/12/2021= 710 feet Goal status: INITIAL              Plan -      Clinical Impression Statement Continued with current plan of care as laid out in evaluation and recent prior sessions. He performed well  overall with improving stamina and no complaint of any stomach pain. He required less overall rest breaks today and able to continue to add more resistive therex in program.  Pt will continue to benefit from skilled physical therapy intervention to address impairments, improve QOL, and attain therapy goals.     Personal Factors and Comorbidities Comorbidity 3+    Comorbidities DM, Liver  transplant, ESRD, CAD, feeding tube    Examination-Activity Limitations Carry;Lift;Reach Overhead;Squat;Stairs;Stand;Transfers;Toileting    Examination-Participation Restrictions Cleaning;Community Activity;Driving;Medication Management;Meal Prep;Yard Work    [employer] officer Evolving/Moderate complexity    Rehab Potential Fair    PT Frequency 2x / week    PT Duration 12 weeks    PT Treatment/Interventions ADLs/Self Care Home Management;Cryotherapy;Moist Heat;DME Instruction;Gait training;Stair training;Functional mobility training;Therapeutic activities;Therapeutic exercise;Balance training;Neuromuscular re-education;Patient/family education;Wheelchair mobility training;Manual techniques;Passive range of motion;Dry needling;Energy conservation;Joint Manipulations    PT Next Visit Plan Progressive Therapeutic exercises, Balance training, transfer/gait training    PT Home Exercise Plan No changes or updates today.    Consulted and Agree with Plan of Care Patient              Lewis Moccasin PT  11/10/2021, 2:10 PM

## 2021-11-14 ENCOUNTER — Ambulatory Visit: Payer: Medicare Other

## 2021-11-16 ENCOUNTER — Ambulatory Visit: Payer: Medicare Other

## 2021-11-16 DIAGNOSIS — R262 Difficulty in walking, not elsewhere classified: Secondary | ICD-10-CM

## 2021-11-16 DIAGNOSIS — R2681 Unsteadiness on feet: Secondary | ICD-10-CM

## 2021-11-16 DIAGNOSIS — R269 Unspecified abnormalities of gait and mobility: Secondary | ICD-10-CM

## 2021-11-16 DIAGNOSIS — R296 Repeated falls: Secondary | ICD-10-CM

## 2021-11-16 DIAGNOSIS — M6281 Muscle weakness (generalized): Secondary | ICD-10-CM

## 2021-11-16 DIAGNOSIS — R2689 Other abnormalities of gait and mobility: Secondary | ICD-10-CM

## 2021-11-16 DIAGNOSIS — R278 Other lack of coordination: Secondary | ICD-10-CM

## 2021-11-16 NOTE — Therapy (Signed)
OUTPATIENT PHYSICAL THERAPY TREATMENT NOTE   Patient Name: Alex Hampton MRN: [id number] DOB:19-Jul-1955, 66 y.o., male Today's Date: 11/18/2021  PCP: Dr. Juluis Pitch REFERRING PROVIDER: Eulis Canner, MD   PT End of Session - 11/18/21 1215     Visit Number 55    Number of Visits 55    Date for PT Re-Evaluation 01/04/22    Authorization Type Medicare/BCBS; PN 03/23/21    Authorization Time Period Initial Cert= 61/44/3154- 00/86/7619; Recert 08/16/3265-05/04/5807: Recert 9/83/3825- 0/08/3974: Recert 10/11/4191- 7/90/2409    Progress Note Due on Visit 60    PT Start Time 1301    PT Stop Time 1342    PT Time Calculation (min) 41 min    Equipment Utilized During Treatment Gait belt    Activity Tolerance Patient limited by fatigue;Patient tolerated treatment well;No increased pain    Behavior During Therapy WFL for tasks assessed/performed                Past Medical History:  Diagnosis Date   Depression    History of cardiac cath    History of heart artery stent    Hyperlipidemia    Hypertension    MI (myocardial infarction) ([place])    Renal disorder    Past Surgical History:  Procedure Laterality Date   IR [facility]J TUBE CHANGE  11/22/2020   IR [place] [place] [employer] TUBE CHANGE  11/26/2020   IR [place] GASTRO/COLONIC TUBE PERCUT W/FLUORO  10/14/2020   LEFT HEART CATH AND CORONARY ANGIOGRAPHY N/A 08/19/2020   Procedure: LEFT HEART CATH AND CORONARY ANGIOGRAPHY;  Surgeon: Corey Skains, MD;  Location: [facility];  Service: Cardiovascular;  Laterality: N/A;   LIVER TRANSPLANT     Patient Active Problem List   Diagnosis Date Noted   Pulmonary embolism ([place]) 08/08/2021   CAP (community acquired pneumonia) 08/08/2021   Protein calorie malnutrition ([place]) 08/08/2021   Hypokalemia 08/08/2021   Pancreatic cyst    Verbal auditory hallucination    Early satiety    Feeding tube dysfunction    Generalized weakness 11/26/2020   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[place]) 11/26/2020    Recurrent falls 11/26/2020   Syncope 10/15/2020   Uremia 10/10/2020   ESRD on hemodialysis ([place]) 10/10/2020   Hyperkalemia 10/10/2020   Elevated troponin 10/10/2020   GERD (gastroesophageal reflux disease) 10/10/2020   CAD (coronary artery disease) 10/10/2020   G tube feedings ([place]) 10/10/2020   Diarrhea 10/10/2020   Diabetes mellitus ([employer]) 10/10/2020   Weakness    ACS (acute coronary syndrome) ([place])    Liver transplant recipient [facility])    Anemia of chronic disease    Type 2 diabetes mellitus with diabetic neuropathy, with long-term current use of insulin ([place])    ESRD (end stage renal disease) ([place]) 08/19/2020   History of anemia due to chronic kidney disease 08/19/2020   Gait abnormality 10/17/2019   History of fall within past 90 days 10/17/2019   Visual hallucination 10/17/2019   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 10/17/2019   HTN (hypertension) 10/17/2019   History of MI (myocardial infarction) 10/17/2019   OSA (obstructive sleep apnea) 10/17/2019   Nondisplaced comminuted supracondylar fracture without intercondylar fracture of left humerus, subsequent encounter for fracture with nonunion 10/17/2019   Anxiety and depression 10/17/2019    REFERRING DIAG: Other malaise Z94.4 (ICD-10-CM) - Liver transplant status R26.9 (ICD-10-CM) - Unspecified abnormalities of gait and mobility   THERAPY DIAG:  Difficulty in walking, not elsewhere classified  Abnormality of gait and mobility  Muscle weakness (generalized)  Other abnormalities of gait and mobility  Other lack of coordination  Unsteadiness on feet  Repeated falls  Rationale for Evaluation and Treatment Rehabilitation  PERTINENT HISTORY: Patient is a 66 year old male with recent referral for abnomality of gait and recent liver transplant in November 2021. He has past medical history significant for Liver transplant (02/18/2020), CKD, End stage renal disease- On hemodialyis T/TH/Sat., Anemia, DM with  neuropathy, Non-stemi, Coronary artery disease, Feeding tube with poor appetite, PE now on eliquis   PRECAUTIONS: none  SUBJECTIVE: Patient reports feeling better than He did on Monday. Denies any new issues.    PAIN:  Are you having pain? No     TODAY'S TREATMENT:  11/16/2021   Therex:  Nustep BUE/LE for muscle strength and endurance L1 x  Total Time= 5 min Total distance= 0.12 mi  At // bars:  Standing step tap onto 6" block with 2.5 lb AW x 1 min (18 reps each)  Stand calf raise BLE x 12 reps Stand lateral step up and then down on opp side  x 5 each direction with 2.5 lb AW  Seated knee ext with 2.5 lb AW 2 x 12 reps Crouched into mini squat- then side step in // bars- down and back with 2.5 lb AW x 2 trips Sit to stand 2 x 5 reps (min BUE Support)  Standing hip march in // bars 2.5 lb. AW  x 10 reps Standing Hip abd using 2.5 lb x 12 reps each LE  Standing Hamstring curl with 2.5 lb AW x 10 reps Deadlift using pvc pipe - VC for correct technique- x 10 reps.   Gait in clinic for approx 300 feet without AD for endurance. - Patient able to complete without any rest break                                 Pt required occasional rest breaks due fatigue, PT was quick to ask when pt appeared to be fatiguing in order to prevent excessive fatigue.   Education provided throughout session via VC/TC and demonstration to facilitate movement at target joints and correct muscle activation for all testing and exercises performed.        PATIENT EDUCATION: Education details: Exercise technique, progress towards remainder goals Person educated: Patient Education method: Explanation, Demonstration, Tactile cues, and Verbal cues Education comprehension: verbalized understanding, returned demonstration, verbal cues required, tactile cues required, and needs further education   HOME EXERCISE PROGRAM: No updates   PT Short Term Goals -      PT SHORT TERM GOAL #1   Title Pt  will be independent with HEP in order to improve strength and balance in order to decrease fall risk and improve function at home and work.    Baseline 02/02/2021: Patient reports limited HEP in place currently from [facility] agency 12/14 compliance; 1/20/2023Patient states no questions regarding his current exercise regimen and states he is compliant.    Time 6    Period Weeks    Status Achieved    Target Date 03/16/21              PT Long Term Goals -       PT LONG TERM GOAL #1   Title Pt will improve FOTO to target score of 50 to display perceived improvements in ability to complete ADL's.    Baseline 02/02/2021= 43 12/143: 51%; 04/29/2021=  53%; 09/09/2021= 53; 10/12/2021=54%   Time 12    Period Weeks    Status Achieved      PT LONG TERM GOAL #2   Title Pt will decrease 5TSTS by at least 8 seconds in order to demonstrate clinically significant improvement in LE strength.    Baseline 02/02/2021= 28.22 sec with BUE Support 12/14: 23.1 seconds; 04/29/2021=Unable to test today due to patient became nauseous with 6 min walk test and attempted TUG- unable to continue- will reassess next 1-2 visits; 06/08/2021= 25.1 sec with min BUE support; 07/20/2021= 17.75 with min BUE support    Time 12    Period Weeks    Status Achieved    Target Date 07/22/21      PT LONG TERM GOAL #3   Title Pt will decrease TUG to below 19 seconds/decrease in order to demonstrate decreased fall risk.    Baseline 02/02/2021= 27.30 sec without AD 12/14: 23 seconds; 04/29/2021- Unable to assess as patient became very nauseated upon standing and request to stop treatment. 06/08/2021= 19.10 sec without an AD; 07/20/2021= 15.68 sec without UE Support    Time 12    Period Weeks    Status Achieved    Target Date 07/22/21      PT LONG TERM GOAL #4   Title Pt will increase 6MWT by at least 25m(1638f in order to demonstrate clinically significant improvement in cardiopulmonary endurance and community ambulation    Baseline  02/02/2021= 83 feet in 1 min 10 sec wihtout an AD 12/14: 200 ft in  37m37mtes; 04/29/2021=Patient ambulated approx 60 feet yet had to stop due onset of nausea and unable to complete today; 07/15/2021= 330 feet in 2 min 25 sec without AD.  09/09/2021= 350 feet in 3 min 45 sec. 10/12/2021= 710 feet in 6 min- no AD (Short stand rest break)    Time 12    Period Weeks    Status Achieved   Target Date 07/22/21      PT LONG TERM GOAL #5   Title Pt will increase 10MWT by at least 0.2 m/s in order to demonstrate clinically significant improvement in community ambulation.    Baseline 02/02/2021= 0.42 m/s 12/14: 0.49 m/s; 04/29/2021=Patient unable to test today secondary to being nauseated. 06/08/2021= 0.62 m/s; 07/20/2021= 0.87 m/s    Time 12    Period Weeks    Status Achieved    Target Date 07/22/21      PT LONG TERM GOAL #6   Title Pt will decrease 5TSTS by at least 3 seconds in order to demonstrate clinically significant improvement in LE strength.    Baseline 07/20/2021= 17.75 sec with Minimal BUE Support; 09/09/2021= 17.50 sec with very minimal UE Support. 10/12/2021= 14.90 sec with min BUE Support.    Time 12    Period Weeks    Status Ongoing   Target Date 10/12/21    PT LONG TERM GOAL #7 Title:  Pt will increase 6MWT by at least 53m31m4ft74f order to demonstrate clinically significant improvement in cardiopulmonary endurance and community ambulation                                                       Baseline: 10/12/2021= 710 feet Goal status: INITIAL              Plan -  Clinical Impression Statement Continued with current plan of care as laid out in evaluation and recent prior sessions. Patient continues to make steady progress with overall strength and endurance. No report of stomach issues and patient able to participate more- full session with some short rest breaks.  Pt will continue to benefit from skilled physical therapy intervention to address impairments, improve QOL, and attain  therapy goals.     Personal Factors and Comorbidities Comorbidity 3+    Comorbidities DM, Liver transplant, ESRD, CAD, feeding tube    Examination-Activity Limitations Carry;Lift;Reach Overhead;Squat;Stairs;Stand;Transfers;Toileting    Examination-Participation Restrictions Cleaning;Community Activity;Driving;Medication Management;Meal Prep;Yard Work    [employer] officer Evolving/Moderate complexity    Rehab Potential Fair    PT Frequency 2x / week    PT Duration 12 weeks    PT Treatment/Interventions ADLs/Self Care Home Management;Cryotherapy;Moist Heat;DME Instruction;Gait training;Stair training;Functional mobility training;Therapeutic activities;Therapeutic exercise;Balance training;Neuromuscular re-education;Patient/family education;Wheelchair mobility training;Manual techniques;Passive range of motion;Dry needling;Energy conservation;Joint Manipulations    PT Next Visit Plan Progressive Therapeutic exercises, Balance training, transfer/gait training    PT Home Exercise Plan No changes or updates today.    Consulted and Agree with Plan of Care Patient              Lewis Moccasin PT  11/18/2021, 12:16 PM

## 2021-11-21 ENCOUNTER — Ambulatory Visit: Payer: Medicare Other

## 2021-11-21 DIAGNOSIS — R269 Unspecified abnormalities of gait and mobility: Secondary | ICD-10-CM

## 2021-11-21 DIAGNOSIS — R2689 Other abnormalities of gait and mobility: Secondary | ICD-10-CM

## 2021-11-21 DIAGNOSIS — R2681 Unsteadiness on feet: Secondary | ICD-10-CM

## 2021-11-21 DIAGNOSIS — M6281 Muscle weakness (generalized): Secondary | ICD-10-CM

## 2021-11-21 DIAGNOSIS — R278 Other lack of coordination: Secondary | ICD-10-CM

## 2021-11-21 DIAGNOSIS — R296 Repeated falls: Secondary | ICD-10-CM

## 2021-11-21 DIAGNOSIS — R262 Difficulty in walking, not elsewhere classified: Secondary | ICD-10-CM | POA: Diagnosis not present

## 2021-11-21 NOTE — Therapy (Signed)
OUTPATIENT PHYSICAL THERAPY TREATMENT NOTE   Patient Name: Alex Hampton MRN: [id number] DOB:November 30, 1955, 66 y.o., male Today's Date: 11/21/2021  PCP: Dr. Juluis Pitch REFERRING PROVIDER: Eulis Canner, MD   PT End of Session - 11/21/21 1258     Visit Number 56    Number of Visits 48    Date for PT Re-Evaluation 01/04/22    Authorization Type Medicare/BCBS; PN 03/23/21    Authorization Time Period Initial Cert= 57/84/6962- 95/28/4132; Recert 4/40/1027-2/53/6644: Recert 0/34/7425- 12/13/6385: Recert 08/14/4330- 9/51/8841    Progress Note Due on Visit 60    PT Start Time 1300    PT Stop Time 1332    PT Time Calculation (min) 32 min    Equipment Utilized During Treatment Gait belt    Activity Tolerance Patient limited by fatigue;Patient tolerated treatment well;No increased pain    Behavior During Therapy WFL for tasks assessed/performed                Past Medical History:  Diagnosis Date   Depression    History of cardiac cath    History of heart artery stent    Hyperlipidemia    Hypertension    MI (myocardial infarction) ([place])    Renal disorder    Past Surgical History:  Procedure Laterality Date   IR [place] TUBE CHANGE  11/22/2020   IR [place] TUBE CHANGE  11/26/2020   IR [place] GASTRO/COLONIC TUBE PERCUT W/FLUORO  10/14/2020   LEFT HEART CATH AND CORONARY ANGIOGRAPHY N/A 08/19/2020   Procedure: LEFT HEART CATH AND CORONARY ANGIOGRAPHY;  Surgeon: Corey Skains, MD;  Location: [facility];  Service: Cardiovascular;  Laterality: N/A;   LIVER TRANSPLANT     Patient Active Problem List   Diagnosis Date Noted   Pulmonary embolism ([place]) 08/08/2021   CAP (community acquired pneumonia) 08/08/2021   Protein calorie malnutrition ([place]) 08/08/2021   Hypokalemia 08/08/2021   Pancreatic cyst    Verbal auditory hallucination    Early satiety    Feeding tube dysfunction    Generalized weakness 11/26/2020   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[place]) 11/26/2020    Recurrent falls 11/26/2020   Syncope 10/15/2020   Uremia 10/10/2020   ESRD on hemodialysis ([place]) 10/10/2020   Hyperkalemia 10/10/2020   Elevated troponin 10/10/2020   GERD (gastroesophageal reflux disease) 10/10/2020   CAD (coronary artery disease) 10/10/2020   G tube feedings ([place]) 10/10/2020   Diarrhea 10/10/2020   Diabetes mellitus ([place]) 10/10/2020   Weakness    ACS (acute coronary syndrome) ([place])    Liver transplant recipient [facility])    Anemia of chronic disease    Type 2 diabetes mellitus with diabetic neuropathy, with long-term current use of insulin ([place])    ESRD (end stage renal disease) ([place]) 08/19/2020   History of anemia due to chronic kidney disease 08/19/2020   Gait abnormality 10/17/2019   History of fall within past 90 days 10/17/2019   Visual hallucination 10/17/2019   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 10/17/2019   HTN (hypertension) 10/17/2019   History of MI (myocardial infarction) 10/17/2019   OSA (obstructive sleep apnea) 10/17/2019   Nondisplaced comminuted supracondylar fracture without intercondylar fracture of left humerus, subsequent encounter for fracture with nonunion 10/17/2019   Anxiety and depression 10/17/2019    REFERRING DIAG: Other malaise Z94.4 (ICD-10-CM) - Liver transplant status R26.9 (ICD-10-CM) - Unspecified abnormalities of gait and mobility   THERAPY DIAG:  Difficulty in walking, not elsewhere classified  Abnormality of gait and mobility  Muscle weakness (generalized)  Other abnormalities of gait and mobility  Other lack of coordination  Unsteadiness on feet  Repeated falls  Rationale for Evaluation and Treatment Rehabilitation  PERTINENT HISTORY: Patient is a 66 year old male with recent referral for abnomality of gait and recent liver transplant in November 2021. He has past medical history significant for Liver transplant (02/18/2020), CKD, End stage renal disease- On hemodialyis T/TH/Sat., Anemia, DM with  neuropathy, Non-stemi, Coronary artery disease, Feeding tube with poor appetite, PE now on eliquis   PRECAUTIONS: none  SUBJECTIVE: Patient reports having a low energy day that started yesterday.  PAIN:  Are you having pain? No     TODAY'S TREATMENT:  11/21/2021   Therex:  At support bar:   Step tap onto green therapad x12 reps alt LE Bicep curl 4lb. Dumbbell x 12 reps Sit to stand x 10 reps without UE support  -brief rest break Repeat all above for 2 sets (sit to stand x 8 on 2nd set)  -brief rest break (patient reported low energy)   -Ambulation x 180 feet without UE support  -Stand calf raise on 1st step with BUE supportx 12reps -B seated shoulder flex <90 deg with 2lb. Dumbbell x 10 -Sit to stand x6 reps                      Pt required rest breaks due fatigue, PT was quick to ask when pt appeared to be fatiguing in order to prevent excessive fatigue. Ultimately patient requested to end visit at 30 min mark stating he was exhausted.    Education provided throughout session via VC/TC and demonstration to facilitate movement at target joints and correct muscle activation for all testing and exercises performed.        PATIENT EDUCATION: Education details: Exercise technique, progress towards remainder goals Person educated: Patient Education method: Explanation, Demonstration, Tactile cues, and Verbal cues Education comprehension: verbalized understanding, returned demonstration, verbal cues required, tactile cues required, and needs further education   HOME EXERCISE PROGRAM: No updates   PT Short Term Goals -      PT SHORT TERM GOAL #1   Title Pt will be independent with HEP in order to improve strength and balance in order to decrease fall risk and improve function at home and work.    Baseline 02/02/2021: Patient reports limited HEP in place currently from [facility] agency 12/14 compliance; 1/20/2023Patient states no questions regarding his current exercise  regimen and states he is compliant.    Time 6    Period Weeks    Status Achieved    Target Date 03/16/21              PT Long Term Goals -       PT LONG TERM GOAL #1   Title Pt will improve FOTO to target score of 50 to display perceived improvements in ability to complete ADL's.    Baseline 02/02/2021= 43 12/143: 51%; 04/29/2021= 53%; 09/09/2021= 53; 10/12/2021=54%   Time 12    Period Weeks    Status Achieved      PT LONG TERM GOAL #2   Title Pt will decrease 5TSTS by at least 8 seconds in order to demonstrate clinically significant improvement in LE strength.    Baseline 02/02/2021= 28.22 sec with BUE Support 12/14: 23.1 seconds; 04/29/2021=Unable to test today due to patient became nauseous with 6 min walk test and attempted TUG- unable to continue- will reassess next 1-2 visits;  06/08/2021= 25.1 sec with min BUE support; 07/20/2021= 17.75 with min BUE support    Time 12    Period Weeks    Status Achieved    Target Date 07/22/21      PT LONG TERM GOAL #3   Title Pt will decrease TUG to below 19 seconds/decrease in order to demonstrate decreased fall risk.    Baseline 02/02/2021= 27.30 sec without AD 12/14: 23 seconds; 04/29/2021- Unable to assess as patient became very nauseated upon standing and request to stop treatment. 06/08/2021= 19.10 sec without an AD; 07/20/2021= 15.68 sec without UE Support    Time 12    Period Weeks    Status Achieved    Target Date 07/22/21      PT LONG TERM GOAL #4   Title Pt will increase 6MWT by at least 40m(1636f in order to demonstrate clinically significant improvement in cardiopulmonary endurance and community ambulation    Baseline 02/02/2021= 83 feet in 1 min 10 sec wihtout an AD 12/14: 200 ft in  59m8mtes; 04/29/2021=Patient ambulated approx 60 feet yet had to stop due onset of nausea and unable to complete today; 07/15/2021= 330 feet in 2 min 25 sec without AD.  09/09/2021= 350 feet in 3 min 45 sec. 10/12/2021= 710 feet in 6 min- no AD (Short stand rest  break)    Time 12    Period Weeks    Status Achieved   Target Date 07/22/21      PT LONG TERM GOAL #5   Title Pt will increase 10MWT by at least 0.2 m/s in order to demonstrate clinically significant improvement in community ambulation.    Baseline 02/02/2021= 0.42 m/s 12/14: 0.49 m/s; 04/29/2021=Patient unable to test today secondary to being nauseated. 06/08/2021= 0.62 m/s; 07/20/2021= 0.87 m/s    Time 12    Period Weeks    Status Achieved    Target Date 07/22/21      PT LONG TERM GOAL #6   Title Pt will decrease 5TSTS by at least 3 seconds in order to demonstrate clinically significant improvement in LE strength.    Baseline 07/20/2021= 17.75 sec with Minimal BUE Support; 09/09/2021= 17.50 sec with very minimal UE Support. 10/12/2021= 14.90 sec with min BUE Support.    Time 12    Period Weeks    Status Ongoing   Target Date 10/12/21    PT LONG TERM GOAL #7 Title:  Pt will increase 6MWT by at least 41m49m4ft73f order to demonstrate clinically significant improvement in cardiopulmonary endurance and community ambulation                                                       Baseline: 10/12/2021= 710 feet Goal status: INITIAL              Plan -      Clinical Impression Statement Continued with current plan of care for LE strengthening and muscular endurance. Patient entered today's session complaining of low energy but agreeable to work as much as he could. He tolerated approx 30 min of seated/standing exercises requiring more rest breaks overall today compared to past visits.  Pt will continue to benefit from skilled physical therapy intervention to address impairments, improve QOL, and attain therapy goals.     Personal Factors and Comorbidities Comorbidity 3+  Comorbidities DM, Liver transplant, ESRD, CAD, feeding tube    Examination-Activity Limitations Carry;Lift;Reach Overhead;Squat;Stairs;Stand;Transfers;Toileting    Examination-Participation Restrictions Cleaning;Community  Activity;Driving;Medication Management;Meal Prep;Yard Work    [employer] officer Evolving/Moderate complexity    Rehab Potential Fair    PT Frequency 2x / week    PT Duration 12 weeks    PT Treatment/Interventions ADLs/Self Care Home Management;Cryotherapy;Moist Heat;DME Instruction;Gait training;Stair training;Functional mobility training;Therapeutic activities;Therapeutic exercise;Balance training;Neuromuscular re-education;Patient/family education;Wheelchair mobility training;Manual techniques;Passive range of motion;Dry needling;Energy conservation;Joint Manipulations    PT Next Visit Plan Progressive Therapeutic exercises, Balance training, transfer/gait training    PT Home Exercise Plan No changes or updates today.    Consulted and Agree with Plan of Care Patient              Lewis Moccasin PT  11/21/2021, 1:44 PM

## 2021-11-23 ENCOUNTER — Ambulatory Visit: Payer: Medicare Other

## 2021-11-23 DIAGNOSIS — R269 Unspecified abnormalities of gait and mobility: Secondary | ICD-10-CM

## 2021-11-23 DIAGNOSIS — R2681 Unsteadiness on feet: Secondary | ICD-10-CM

## 2021-11-23 DIAGNOSIS — R2689 Other abnormalities of gait and mobility: Secondary | ICD-10-CM

## 2021-11-23 DIAGNOSIS — R296 Repeated falls: Secondary | ICD-10-CM

## 2021-11-23 DIAGNOSIS — R262 Difficulty in walking, not elsewhere classified: Secondary | ICD-10-CM | POA: Diagnosis not present

## 2021-11-23 DIAGNOSIS — R278 Other lack of coordination: Secondary | ICD-10-CM

## 2021-11-23 DIAGNOSIS — M6281 Muscle weakness (generalized): Secondary | ICD-10-CM

## 2021-11-23 NOTE — Therapy (Signed)
OUTPATIENT PHYSICAL THERAPY TREATMENT NOTE   Patient Name: Alex Hampton MRN: [id number] DOB:10-02-1955, 66 y.o., male Today's Date: 11/23/2021  PCP: Dr. Juluis Pitch REFERRING PROVIDER: Eulis Canner, MD   PT End of Session - 11/23/21 1307     Visit Number 91    Number of Visits 38    Date for PT Re-Evaluation 01/04/22    Authorization Type Medicare/BCBS; PN 03/23/21    Authorization Time Period Initial Cert= 60/01/9322- 55/73/2202; Recert 5/42/7062-3/76/2831: Recert 08/25/6158- 10/10/7104: Recert 05/17/9483- 4/62/7035    Progress Note Due on Visit 60    PT Start Time 1259    PT Stop Time 1341    PT Time Calculation (min) 42 min    Equipment Utilized During Treatment Gait belt    Activity Tolerance Patient limited by fatigue;Patient tolerated treatment well;No increased pain    Behavior During Therapy WFL for tasks assessed/performed                Past Medical History:  Diagnosis Date   Depression    History of cardiac cath    History of heart artery stent    Hyperlipidemia    Hypertension    MI (myocardial infarction) ([place])    Renal disorder    Past Surgical History:  Procedure Laterality Date   IR [place] TUBE CHANGE  11/22/2020   IR [place] TUBE CHANGE  11/26/2020   IR [place] GASTRO/COLONIC TUBE PERCUT W/FLUORO  10/14/2020   LEFT HEART CATH AND CORONARY ANGIOGRAPHY N/A 08/19/2020   Procedure: LEFT HEART CATH AND CORONARY ANGIOGRAPHY;  Surgeon: Corey Skains, MD;  Location: [facility];  Service: Cardiovascular;  Laterality: N/A;   LIVER TRANSPLANT     Patient Active Problem List   Diagnosis Date Noted   Pulmonary embolism ([place]) 08/08/2021   CAP (community acquired pneumonia) 08/08/2021   Protein calorie malnutrition ([place]) 08/08/2021   Hypokalemia 08/08/2021   Pancreatic cyst    Verbal auditory hallucination    Early satiety    Feeding tube dysfunction    Generalized weakness 11/26/2020   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[place]) 11/26/2020    Recurrent falls 11/26/2020   Syncope 10/15/2020   Uremia 10/10/2020   ESRD on hemodialysis ([place]) 10/10/2020   Hyperkalemia 10/10/2020   Elevated troponin 10/10/2020   GERD (gastroesophageal reflux disease) 10/10/2020   CAD (coronary artery disease) 10/10/2020   G tube feedings ([place]) 10/10/2020   Diarrhea 10/10/2020   Diabetes mellitus ([place]) 10/10/2020   Weakness    ACS (acute coronary syndrome) ([place])    Liver transplant recipient [facility])    Anemia of chronic disease    Type 2 diabetes mellitus with diabetic neuropathy, with long-term current use of insulin ([place])    ESRD (end stage renal disease) ([place]) 08/19/2020   History of anemia due to chronic kidney disease 08/19/2020   Gait abnormality 10/17/2019   History of fall within past 90 days 10/17/2019   Visual hallucination 10/17/2019   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 10/17/2019   HTN (hypertension) 10/17/2019   History of MI (myocardial infarction) 10/17/2019   OSA (obstructive sleep apnea) 10/17/2019   Nondisplaced comminuted supracondylar fracture without intercondylar fracture of left humerus, subsequent encounter for fracture with nonunion 10/17/2019   Anxiety and depression 10/17/2019    REFERRING DIAG: Other malaise Z94.4 (ICD-10-CM) - Liver transplant status R26.9 (ICD-10-CM) - Unspecified abnormalities of gait and mobility   THERAPY DIAG:  Difficulty in walking, not elsewhere classified  Abnormality of gait and mobility  Muscle weakness (generalized)  Other abnormalities of gait and mobility  Other lack of coordination  Unsteadiness on feet  Repeated falls  Rationale for Evaluation and Treatment Rehabilitation  PERTINENT HISTORY: Patient is a 66 year old male with recent referral for abnomality of gait and recent liver transplant in November 2021. He has past medical history significant for Liver transplant (02/18/2020), CKD, End stage renal disease- On hemodialyis T/TH/Sat., Anemia, DM with  neuropathy, Non-stemi, Coronary artery disease, Feeding tube with poor appetite, PE now on eliquis   PRECAUTIONS: none  SUBJECTIVE: Patient reports having a low energy day that started yesterday.  PAIN:  Are you having pain? No     TODAY'S TREATMENT:  11/21/2021   Therex:  Sitting:  Scap retract with GTB 2 sets of 12 reps  Seated hip march alt LE 3lb AW- 2 sets of 12 reps Seated bicep curl GTB - 2sets of 12 reps Seated calf raises with 3lb AW - 2 sets of 12 reps Seated tricep press down - GTB BUE - 2 sets of 12 reps Seated Knee ext 3lb AW- alt LE - 2 sets of 12 reps Seated hip flex/abd up/over 1lb dumbell x 12 reps Seated shoulder horizontal abduction x 12 reps *Patient stopped complaining of increased nausea and unable to continue.                       Pt required rest breaks due fatigue, PT was quick to ask when pt appeared to be fatiguing in order to prevent excessive fatigue.   Education provided throughout session via VC/TC and demonstration to facilitate movement at target joints and correct muscle activation for all testing and exercises performed.        PATIENT EDUCATION: Education details: Exercise technique, progress towards remainder goals Person educated: Patient Education method: Explanation, Demonstration, Tactile cues, and Verbal cues Education comprehension: verbalized understanding, returned demonstration, verbal cues required, tactile cues required, and needs further education   HOME EXERCISE PROGRAM: No updates   PT Short Term Goals -      PT SHORT TERM GOAL #1   Title Pt will be independent with HEP in order to improve strength and balance in order to decrease fall risk and improve function at home and work.    Baseline 02/02/2021: Patient reports limited HEP in place currently from [facility] agency 12/14 compliance; 1/20/2023Patient states no questions regarding his current exercise regimen and states he is compliant.    Time 6    Period Weeks     Status Achieved    Target Date 03/16/21              PT Long Term Goals -       PT LONG TERM GOAL #1   Title Pt will improve FOTO to target score of 50 to display perceived improvements in ability to complete ADL's.    Baseline 02/02/2021= 43 12/143: 51%; 04/29/2021= 53%; 09/09/2021= 53; 10/12/2021=54%   Time 12    Period Weeks    Status Achieved      PT LONG TERM GOAL #2   Title Pt will decrease 5TSTS by at least 8 seconds in order to demonstrate clinically significant improvement in LE strength.    Baseline 02/02/2021= 28.22 sec with BUE Support 12/14: 23.1 seconds; 04/29/2021=Unable to test today due to patient became nauseous with 6 min walk test and attempted TUG- unable to continue- will reassess next 1-2 visits; 06/08/2021= 25.1 sec with min BUE support; 07/20/2021= 17.75  with min BUE support    Time 12    Period Weeks    Status Achieved    Target Date 07/22/21      PT LONG TERM GOAL #3   Title Pt will decrease TUG to below 19 seconds/decrease in order to demonstrate decreased fall risk.    Baseline 02/02/2021= 27.30 sec without AD 12/14: 23 seconds; 04/29/2021- Unable to assess as patient became very nauseated upon standing and request to stop treatment. 06/08/2021= 19.10 sec without an AD; 07/20/2021= 15.68 sec without UE Support    Time 12    Period Weeks    Status Achieved    Target Date 07/22/21      PT LONG TERM GOAL #4   Title Pt will increase 6MWT by at least 68m(1657f in order to demonstrate clinically significant improvement in cardiopulmonary endurance and community ambulation    Baseline 02/02/2021= 83 feet in 1 min 10 sec wihtout an AD 12/14: 200 ft in  99m999mtes; 04/29/2021=Patient ambulated approx 60 feet yet had to stop due onset of nausea and unable to complete today; 07/15/2021= 330 feet in 2 min 25 sec without AD.  09/09/2021= 350 feet in 3 min 45 sec. 10/12/2021= 710 feet in 6 min- no AD (Short stand rest break)    Time 12    Period Weeks    Status Achieved    Target Date 07/22/21      PT LONG TERM GOAL #5   Title Pt will increase 10MWT by at least 0.2 m/s in order to demonstrate clinically significant improvement in community ambulation.    Baseline 02/02/2021= 0.42 m/s 12/14: 0.49 m/s; 04/29/2021=Patient unable to test today secondary to being nauseated. 06/08/2021= 0.62 m/s; 07/20/2021= 0.87 m/s    Time 12    Period Weeks    Status Achieved    Target Date 07/22/21      PT LONG TERM GOAL #6   Title Pt will decrease 5TSTS by at least 3 seconds in order to demonstrate clinically significant improvement in LE strength.    Baseline 07/20/2021= 17.75 sec with Minimal BUE Support; 09/09/2021= 17.50 sec with very minimal UE Support. 10/12/2021= 14.90 sec with min BUE Support.    Time 12    Period Weeks    Status Ongoing   Target Date 10/12/21    PT LONG TERM GOAL #7 Title:  Pt will increase 6MWT by at least 31m19m4ft22f order to demonstrate clinically significant improvement in cardiopulmonary endurance and community ambulation                                                       Baseline: 10/12/2021= 710 feet Goal status: INITIAL              Plan -      Clinical Impression Statement Continued with current plan of care for LE strengthening and muscular endurance. Patient began session stating his stomach was having issues and not feeling well at all. Treatment was modified to seated therex with increased overall rest breaks. Ultimately- despite only seated therex and rest breaks- patient experienced increased nausea and requested to end session early.   Pt will continue to benefit from skilled physical therapy intervention to address impairments, improve QOL, and attain therapy goals.     Personal Factors and Comorbidities Comorbidity 3+  Comorbidities DM, Liver transplant, ESRD, CAD, feeding tube    Examination-Activity Limitations Carry;Lift;Reach Overhead;Squat;Stairs;Stand;Transfers;Toileting    Examination-Participation Restrictions  Cleaning;Community Activity;Driving;Medication Management;Meal Prep;Yard Work    [employer] officer Evolving/Moderate complexity    Rehab Potential Fair    PT Frequency 2x / week    PT Duration 12 weeks    PT Treatment/Interventions ADLs/Self Care Home Management;Cryotherapy;Moist Heat;DME Instruction;Gait training;Stair training;Functional mobility training;Therapeutic activities;Therapeutic exercise;Balance training;Neuromuscular re-education;Patient/family education;Wheelchair mobility training;Manual techniques;Passive range of motion;Dry needling;Energy conservation;Joint Manipulations    PT Next Visit Plan Progressive Therapeutic exercises, Balance training, transfer/gait training    PT Home Exercise Plan No changes or updates today.    Consulted and Agree with Plan of Care Patient              Lewis Moccasin PT  11/23/2021, 1:42 PM

## 2021-11-28 ENCOUNTER — Ambulatory Visit: Payer: Medicare Other

## 2021-11-28 DIAGNOSIS — R262 Difficulty in walking, not elsewhere classified: Secondary | ICD-10-CM | POA: Diagnosis not present

## 2021-11-28 DIAGNOSIS — M6281 Muscle weakness (generalized): Secondary | ICD-10-CM

## 2021-11-28 DIAGNOSIS — R278 Other lack of coordination: Secondary | ICD-10-CM

## 2021-11-28 DIAGNOSIS — R2681 Unsteadiness on feet: Secondary | ICD-10-CM

## 2021-11-28 DIAGNOSIS — R2689 Other abnormalities of gait and mobility: Secondary | ICD-10-CM

## 2021-11-28 DIAGNOSIS — R269 Unspecified abnormalities of gait and mobility: Secondary | ICD-10-CM

## 2021-11-28 NOTE — Therapy (Signed)
OUTPATIENT PHYSICAL THERAPY TREATMENT NOTE   Patient Name: Alex Hampton MRN: [id number] DOB:1956-02-17, 66 y.o., male Today's Date: 11/28/2021  PCP: Dr. Juluis Pitch REFERRING PROVIDER: Eulis Canner, MD   PT End of Session - 11/28/21 1300     Visit Number 37    Number of Visits 67    Date for PT Re-Evaluation 01/04/22    Authorization Type Medicare/BCBS; PN 03/23/21    Authorization Time Period Initial Cert= 37/85/8850- 27/74/1287; Recert 8/67/6720-9/47/0962: Recert 8/36/6294- 10/14/5463: Recert 0/06/5463- 6/81/2751    Progress Note Due on Visit 60    PT Start Time 1300    PT Stop Time 1340    PT Time Calculation (min) 40 min    Equipment Utilized During Treatment Gait belt    Activity Tolerance Patient limited by fatigue;Patient tolerated treatment well;No increased pain    Behavior During Therapy WFL for tasks assessed/performed                Past Medical History:  Diagnosis Date   Depression    History of cardiac cath    History of heart artery stent    Hyperlipidemia    Hypertension    MI (myocardial infarction) ([place])    Renal disorder    Past Surgical History:  Procedure Laterality Date   IR GJ TUBE CHANGE  11/22/2020   IR [place] TUBE CHANGE  11/26/2020   IR [place] GASTRO/COLONIC TUBE PERCUT W/FLUORO  10/14/2020   LEFT HEART CATH AND CORONARY ANGIOGRAPHY N/A 08/19/2020   Procedure: LEFT HEART CATH AND CORONARY ANGIOGRAPHY;  Surgeon: Corey Skains, MD;  Location: [facility];  Service: Cardiovascular;  Laterality: N/A;   LIVER TRANSPLANT     Patient Active Problem List   Diagnosis Date Noted   Pulmonary embolism ([place]) 08/08/2021   CAP (community acquired pneumonia) 08/08/2021   Protein calorie malnutrition ([place]) 08/08/2021   Hypokalemia 08/08/2021   Pancreatic cyst    Verbal auditory hallucination    Early satiety    Feeding tube dysfunction    Generalized weakness 11/26/2020   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[place]) 11/26/2020    Recurrent falls 11/26/2020   Syncope 10/15/2020   Uremia 10/10/2020   ESRD on hemodialysis ([place]) 10/10/2020   Hyperkalemia 10/10/2020   Elevated troponin 10/10/2020   GERD (gastroesophageal reflux disease) 10/10/2020   CAD (coronary artery disease) 10/10/2020   G tube feedings ([place]) 10/10/2020   Diarrhea 10/10/2020   Diabetes mellitus (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 10/17/2019   HTN (hypertension) 10/17/2019   History of MI (myocardial infarction) 10/17/2019   OSA (obstructive sleep apnea) 10/17/2019   Nondisplaced comminuted supracondylar fracture without intercondylar fracture of left humerus, subsequent encounter for fracture with nonunion 10/17/2019   Anxiety and depression 10/17/2019    REFERRING DIAG: Other malaise Z94.4 (ICD-10-CM) - Liver transplant status R26.9 (ICD-10-CM) - Unspecified abnormalities of gait and mobility   THERAPY DIAG:  Difficulty in walking, not elsewhere classified  Abnormality of gait and mobility  Muscle weakness (generalized)  Other abnormalities of gait and mobility  Other lack of coordination  Unsteadiness on feet  Rationale for Evaluation and Treatment Rehabilitation  PERTINENT HISTORY: Patient is a 66 year old male with recent referral for abnomality of gait and recent liver transplant in November 2021. He has past medical history significant for Liver transplant (02/18/2020), CKD, End stage renal disease- On hemodialyis T/TH/Sat., Anemia, DM with neuropathy, Non-stemi,  Coronary artery disease, Feeding tube with poor appetite, PE now on eliquis   PRECAUTIONS: none  SUBJECTIVE: Patient reports feeling better so far this week. No new issues but stomach okay - "So far So good."  PAIN:  Are you having pain? No     TODAY'S TREATMENT:  11/28/2021   Therex:  Interval Nustep: for MM strength and muscle endurance.  L0 for 1 min 30 sec L3 for 30 sec L0 for 69mn 30 sec L3 for 30 sec L0 for 141m Total of 5 min   Standing Scap rows with matrix cable system 2 sets of 12 reps at 7.5 lb  Seated hip flex/abd  alt LE up and over orange hurdle x  2 sets of 10 reps Standing bicep curl Matrix cable system-at 7.5 lb-  2sets of 12 reps Stand hip march/step tap onto 6" block without  AW - 2 sets of 12 reps Stand tricep press down- matrix cable system - 7.5 lb- 2 sets of 10 reps Seated calf raises (toe box on edge of 6" block) 2 sets of 12 reps.  Standing Hip abd walk down and back in // bars (approx 6 step down and 6 back for 2 trips) x 2 total sets Seated Horizontal shoulder abd with RTB x 10 reps x 2 sets                   *Patient required brief rest break between each set  Education provided throughout session via VC/TC and demonstration to facilitate movement at target joints and correct muscle activation for all testing and exercises performed.        PATIENT EDUCATION: Education details: Exercise technique, progress towards remainder goals Person educated: Patient Education method: Explanation, Demonstration, Tactile cues, and Verbal cues Education comprehension: verbalized understanding, returned demonstration, verbal cues required, tactile cues required, and needs further education   HOME EXERCISE PROGRAM: No updates   PT Short Term Goals -      PT SHORT TERM GOAL #1   Title Pt will be independent with HEP in order to improve strength and balance in order to decrease fall risk and improve function at home and work.    Baseline 02/02/2021:  [facility]; 1/20/2023Patient states no questions regarding his current exercise regimen and states he is compliant.    Time 6    Period Weeks    Status Achieved    Target Date 03/16/21              PT Long Term Goals -       PT LONG TERM GOAL #1   Title Pt will improve FOTO to target score of 50 to display perceived improvements in ability to complete ADL's.    Baseline 02/02/2021= 43 12/143: 51%; 04/29/2021= 53%; 09/09/2021= 53; 10/12/2021=54%   Time 12    Period Weeks    Status Achieved      PT LONG TERM GOAL #2   Title Pt will decrease 5TSTS by at least 8 seconds in  order to demonstrate clinically significant improvement in LE strength.    Baseline 02/02/2021= 28.22 sec with BUE Support 12/14: 23.1 seconds; 04/29/2021=Unable to test today due to patient became nauseous with 6 min walk test and attempted TUG- unable to continue- will reassess next 1-2 visits; 06/08/2021= 25.1 sec with min BUE support; 07/20/2021= 17.75 with min BUE support    Time 12    Period Weeks    Status Achieved    Target Date 07/22/21      PT LONG TERM GOAL #3   Title Pt will decrease TUG to below 19 seconds/decrease in order to demonstrate decreased fall risk.    Baseline 02/02/2021= 27.30 sec without AD 12/14: 23 seconds; 04/29/2021- Unable to assess as patient became very nauseated upon standing and request to stop treatment. 06/08/2021= 19.10 sec without an AD; 07/20/2021= 15.68 sec without UE Support    Time 12    Period Weeks    Status Achieved    Target Date 07/22/21      PT LONG TERM GOAL #4   Title Pt will increase 6MWT by at least 22m(1675f in order to demonstrate clinically significant improvement in cardiopulmonary endurance and community ambulation    Baseline 02/02/2021= 83 feet in 1 min 10 sec wihtout an AD 12/14: 200 ft in  69m769mtes; 04/29/2021=Patient ambulated approx 60 feet yet had to stop due onset of nausea and unable to  complete today; 07/15/2021= 330 feet in 2 min 25 sec without AD.  09/09/2021= 350 feet in 3 min 45 sec. 10/12/2021= 710 feet in 6 min- no AD (Short stand rest break)    Time 12    Period Weeks    Status Achieved   Target Date 07/22/21      PT LONG TERM GOAL #5   Title Pt will increase 10MWT by at least 0.2 m/s in order to demonstrate clinically significant improvement in community ambulation.    Baseline 02/02/2021= 0.42 m/s 12/14: 0.49 m/s; 04/29/2021=Patient unable to test today secondary to being nauseated. 06/08/2021= 0.62 m/s; 07/20/2021= 0.87 m/s    Time 12    Period Weeks    Status Achieved    Target Date 07/22/21      PT LONG TERM GOAL #6   Title Pt will decrease 5TSTS by at least 3 seconds in order to demonstrate clinically significant improvement in LE strength.    Baseline 07/20/2021= 17.75 sec with Minimal BUE Support; 09/09/2021= 17.50 sec with very minimal UE Support. 10/12/2021= 14.90 sec with min BUE Support.    Time 12    Period Weeks    Status Ongoing   Target Date 10/12/21    PT LONG TERM GOAL #7 Title:  Pt will increase 6MWT by at least 24m51m4ft53f order to demonstrate clinically significant improvement in cardiopulmonary endurance and community ambulation                                                       Baseline: 10/12/2021= 710 feet Goal status: INITIAL              Plan -      Clinical Impression Statement Patient rebounded today nicely- Able to tolerate almost full session mixed with both seated and standing UE/LE strengthening. Patient able to accept more resistance mixed with intermittent standing without report of increased  nausea.  Pt will continue to benefit from skilled physical therapy intervention to address impairments, improve QOL, and attain therapy goals.     Personal Factors and Comorbidities Comorbidity 3+    Comorbidities DM, Liver transplant, ESRD, CAD, feeding tube    Examination-Activity Limitations Carry;Lift;Reach  Overhead;Squat;Stairs;Stand;Transfers;Toileting    Examination-Participation Restrictions Cleaning;Community Activity;Driving;Medication Management;Meal Prep;Yard Work    [employer] officer Evolving/Moderate complexity    Rehab Potential Fair    PT Frequency 2x / week    PT Duration 12 weeks    PT Treatment/Interventions ADLs/Self Care Home Management;Cryotherapy;Moist Heat;DME Instruction;Gait training;Stair training;Functional mobility training;Therapeutic activities;Therapeutic exercise;Balance training;Neuromuscular re-education;Patient/family education;Wheelchair mobility training;Manual techniques;Passive range of motion;Dry needling;Energy conservation;Joint Manipulations    PT Next Visit Plan Progressive Therapeutic exercises, Balance training, transfer/gait training    PT Home Exercise Plan No changes or updates today.    Consulted and Agree with Plan of Care Patient              Lewis Moccasin PT  11/28/2021, 1:47 PM

## 2021-11-30 ENCOUNTER — Ambulatory Visit: Payer: Medicare Other

## 2021-11-30 DIAGNOSIS — M6281 Muscle weakness (generalized): Secondary | ICD-10-CM

## 2021-11-30 DIAGNOSIS — R262 Difficulty in walking, not elsewhere classified: Secondary | ICD-10-CM | POA: Diagnosis not present

## 2021-11-30 DIAGNOSIS — R2681 Unsteadiness on feet: Secondary | ICD-10-CM

## 2021-11-30 DIAGNOSIS — R278 Other lack of coordination: Secondary | ICD-10-CM

## 2021-11-30 DIAGNOSIS — R2689 Other abnormalities of gait and mobility: Secondary | ICD-10-CM

## 2021-11-30 DIAGNOSIS — R269 Unspecified abnormalities of gait and mobility: Secondary | ICD-10-CM

## 2021-11-30 NOTE — Therapy (Signed)
OUTPATIENT PHYSICAL THERAPY TREATMENT NOTE   Patient Name: Alex Hampton MRN: [id number] DOB:1955-08-08, 66 y.o., male Today's Date: 12/01/2021  PCP: Dr. Juluis Pitch REFERRING PROVIDER: Eulis Canner, MD   PT End of Session - 11/30/21 1445     Visit Number 78    Number of Visits 61    Date for PT Re-Evaluation 01/04/22    Authorization Type Medicare/BCBS; PN 03/23/21    Authorization Time Period Initial Cert= 09/73/5329- 92/42/6834; Recert 1/96/2229-7/98/9211: Recert 9/41/7408- 04/13/4816: Recert 08/14/3147- 10/09/6376    Progress Note Due on Visit 60    PT Start Time 1432    PT Stop Time 1512    PT Time Calculation (min) 40 min    Equipment Utilized During Treatment Gait belt    Activity Tolerance Patient limited by fatigue;Patient tolerated treatment well;No increased pain    Behavior During Therapy WFL for tasks assessed/performed                Past Medical History:  Diagnosis Date   Depression    History of cardiac cath    History of heart artery stent    Hyperlipidemia    Hypertension    MI (myocardial infarction) ([place])    Renal disorder    Past Surgical History:  Procedure Laterality Date   IR GJ TUBE CHANGE  11/22/2020   IR [place] TUBE CHANGE  11/26/2020   IR [place] GASTRO/COLONIC TUBE PERCUT W/FLUORO  10/14/2020   LEFT HEART CATH AND CORONARY ANGIOGRAPHY N/A 08/19/2020   Procedure: LEFT HEART CATH AND CORONARY ANGIOGRAPHY;  Surgeon: Corey Skains, MD;  Location: [facility];  Service: Cardiovascular;  Laterality: N/A;   LIVER TRANSPLANT     Patient Active Problem List   Diagnosis Date Noted   Pulmonary embolism ([place]) 08/08/2021   CAP (community acquired pneumonia) 08/08/2021   Protein calorie malnutrition ([place]) 08/08/2021   Hypokalemia 08/08/2021   Pancreatic cyst    Verbal auditory hallucination    Early satiety    Feeding tube dysfunction    Generalized weakness 11/26/2020   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[place]) 11/26/2020    Recurrent falls 11/26/2020   Syncope 10/15/2020   Uremia 10/10/2020   ESRD on hemodialysis ([place]) 10/10/2020   Hyperkalemia 10/10/2020   Elevated troponin 10/10/2020   GERD (gastroesophageal reflux disease) 10/10/2020   CAD (coronary artery disease) 10/10/2020   G tube feedings ([place]) 10/10/2020   Diarrhea 10/10/2020   Diabetes mellitus ([place]) 10/10/2020   Weakness    ACS (acute coronary syndrome) ([place])    Liver transplant recipient [facility])    Anemia of chronic disease    Type 2 diabetes mellitus with diabetic neuropathy, with long-term current use of insulin ([place])    ESRD (end stage renal disease) ([place]) 08/19/2020   History of anemia due to chronic kidney disease 08/19/2020   Gait abnormality 10/17/2019   History of fall within past 90 days 10/17/2019   Visual hallucination 10/17/2019   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 10/17/2019   HTN (hypertension) 10/17/2019   History of MI (myocardial infarction) 10/17/2019   OSA (obstructive sleep apnea) 10/17/2019   Nondisplaced comminuted supracondylar fracture without intercondylar fracture of left humerus, subsequent encounter for fracture with nonunion 10/17/2019   Anxiety and depression 10/17/2019    REFERRING DIAG: Other malaise Z94.4 (ICD-10-CM) - Liver transplant status R26.9 (ICD-10-CM) - Unspecified abnormalities of gait and mobility   THERAPY DIAG:  Difficulty in walking, not elsewhere classified  Abnormality of gait and mobility  Muscle weakness (generalized)  Other abnormalities of gait and mobility  Other lack of coordination  Unsteadiness on feet  Rationale for Evaluation and Treatment Rehabilitation  PERTINENT HISTORY: Patient is a 66 year old male with recent referral for abnomality of gait and recent liver transplant in November 2021. He has past medical history significant for Liver transplant (02/18/2020), CKD, End stage renal disease- On hemodialyis T/TH/Sat., Anemia, DM with neuropathy, Non-stemi,  Coronary artery disease, Feeding tube with poor appetite, PE now on eliquis   PRECAUTIONS: none  SUBJECTIVE: Patient reports feeling okay again today and did not report any stomach issues. PAIN:  Are you having pain? No     TODAY'S TREATMENT:  11/30/2021   Therex:  Ladder - forward/retro with 3lb AW down and back x 3 rounds Ladder - side step in // bars with 3lb AW down and back x 3 rounds Static stand with thoracic trunk rotation (back foot on airex pad and front leg on 6" step) with arms outstretched Dynamic marching with 3lb AW on airex pad without UE support x 15 (fair balance but very quick marching - VC to slow down and focus more on balance.  Sit to stand without UE support holding onto 2.2lb yellow weighted ball x 3 - stopped due to fatigue but able to complete 10 reps total with min UE support on armest Standing hip ext/Knee flex x 10 reps alt LE using 3lb AW Cone activities- Figure 8 forward and retro along with later some diagonals and squats down to pick up 10 cones. Patient reported activity as hard.                   *Patient required brief rest break between each set  Education provided throughout session via VC/TC and demonstration to facilitate movement at target joints and correct muscle activation for all testing and exercises performed.        PATIENT EDUCATION: Education details: Exercise technique, progress towards remainder goals Person educated: Patient Education method: Explanation, Demonstration, Tactile cues, and Verbal cues Education comprehension: verbalized understanding, returned demonstration, verbal cues required, tactile cues required, and needs further education   HOME EXERCISE PROGRAM: No updates   PT Short Term Goals -      PT SHORT TERM GOAL #1   Title Pt will be independent with HEP in order to improve strength and balance in order to decrease fall risk and improve function at home and work.    Baseline 02/02/2021: Patient reports  limited HEP in [facility] 12/14 compliance; 1/20/2023Patient states no questions regarding his current exercise regimen and states he is compliant.    Time 6    Period Weeks    Status Achieved    Target Date 03/16/21              PT Long Term Goals -       PT LONG TERM GOAL #1   Title Pt will improve FOTO to target score of 50 to display perceived improvements in ability to complete ADL's.    Baseline 02/02/2021= 43 12/143: 51%; 04/29/2021= 53%; 09/09/2021= 53; 10/12/2021=54%   Time 12    Period Weeks    Status Achieved      PT LONG TERM GOAL #2   Title Pt will decrease 5TSTS by at least 8 seconds in order to demonstrate clinically significant improvement in LE strength.    Baseline 02/02/2021= 28.22 sec with BUE Support 12/14: 23.1 seconds; 04/29/2021=Unable to test today due  to patient became nauseous with 6 min walk test and attempted TUG- unable to continue- will reassess next 1-2 visits; 06/08/2021= 25.1 sec with min BUE support; 07/20/2021= 17.75 with min BUE support    Time 12    Period Weeks    Status Achieved    Target Date 07/22/21      PT LONG TERM GOAL #3   Title Pt will decrease TUG to below 19 seconds/decrease in order to demonstrate decreased fall risk.    Baseline 02/02/2021= 27.30 sec without AD 12/14: 23 seconds; 04/29/2021- Unable to assess as patient became very nauseated upon standing and request to stop treatment. 06/08/2021= 19.10 sec without an AD; 07/20/2021= 15.68 sec without UE Support    Time 12    Period Weeks    Status Achieved    Target Date 07/22/21      PT LONG TERM GOAL #4   Title Pt will increase 6MWT by at least 79m(1668f in order to demonstrate clinically significant improvement in cardiopulmonary endurance and community ambulation    Baseline 02/02/2021= 83 feet in 1 min 10 sec wihtout an AD 12/14: 200 ft in  51m31mtes; 04/29/2021=Patient ambulated approx 60 feet yet had to stop due onset of nausea and unable to complete today;  07/15/2021= 330 feet in 2 min 25 sec without AD.  09/09/2021= 350 feet in 3 min 45 sec. 10/12/2021= 710 feet in 6 min- no AD (Short stand rest break)    Time 12    Period Weeks    Status Achieved   Target Date 07/22/21      PT LONG TERM GOAL #5   Title Pt will increase 10MWT by at least 0.2 m/s in order to demonstrate clinically significant improvement in community ambulation.    Baseline 02/02/2021= 0.42 m/s 12/14: 0.49 m/s; 04/29/2021=Patient unable to test today secondary to being nauseated. 06/08/2021= 0.62 m/s; 07/20/2021= 0.87 m/s    Time 12    Period Weeks    Status Achieved    Target Date 07/22/21      PT LONG TERM GOAL #6   Title Pt will decrease 5TSTS by at least 3 seconds in order to demonstrate clinically significant improvement in LE strength.    Baseline 07/20/2021= 17.75 sec with Minimal BUE Support; 09/09/2021= 17.50 sec with very minimal UE Support. 10/12/2021= 14.90 sec with min BUE Support.    Time 12    Period Weeks    Status Ongoing   Target Date 10/12/21    PT LONG TERM GOAL #7 Title:  Pt will increase 6MWT by at least 15m751m4ft90f order to demonstrate clinically significant improvement in cardiopulmonary endurance and community ambulation                                                       Baseline: 10/12/2021= 710 feet Goal status: INITIAL              Plan -      Clinical Impression Statement Patient experienced back to back good visits without stomach issues. He was able to continue with more standing/balance activities today without report of any pain and improving tolerance to increased activity level.  Pt will continue to benefit from skilled physical therapy intervention to address impairments, improve QOL, and attain therapy goals.     Personal Factors and  Comorbidities Comorbidity 3+    Comorbidities DM, Liver transplant, ESRD, CAD, feeding tube    Examination-Activity Limitations Carry;Lift;Reach Overhead;Squat;Stairs;Stand;Transfers;Toileting     Examination-Participation Restrictions Cleaning;Community Activity;Driving;Medication Management;Meal Prep;Yard Work    [employer] officer Evolving/Moderate complexity    Rehab Potential Fair    PT Frequency 2x / week    PT Duration 12 weeks    PT Treatment/Interventions ADLs/Self Care Home Management;Cryotherapy;Moist Heat;DME Instruction;Gait training;Stair training;Functional mobility training;Therapeutic activities;Therapeutic exercise;Balance training;Neuromuscular re-education;Patient/family education;Wheelchair mobility training;Manual techniques;Passive range of motion;Dry needling;Energy conservation;Joint Manipulations    PT Next Visit Plan Progressive Therapeutic exercises, Balance training, transfer/gait training    PT Home Exercise Plan No changes or updates today.    Consulted and Agree with Plan of Care Patient              Lewis Moccasin PT  12/01/2021, 12:06 PM

## 2021-12-05 ENCOUNTER — Ambulatory Visit: Payer: Medicare Other

## 2021-12-05 DIAGNOSIS — M6281 Muscle weakness (generalized): Secondary | ICD-10-CM

## 2021-12-05 DIAGNOSIS — R296 Repeated falls: Secondary | ICD-10-CM

## 2021-12-05 DIAGNOSIS — R269 Unspecified abnormalities of gait and mobility: Secondary | ICD-10-CM

## 2021-12-05 DIAGNOSIS — R278 Other lack of coordination: Secondary | ICD-10-CM

## 2021-12-05 DIAGNOSIS — R262 Difficulty in walking, not elsewhere classified: Secondary | ICD-10-CM | POA: Diagnosis not present

## 2021-12-05 DIAGNOSIS — R2681 Unsteadiness on feet: Secondary | ICD-10-CM

## 2021-12-05 DIAGNOSIS — R2689 Other abnormalities of gait and mobility: Secondary | ICD-10-CM

## 2021-12-05 NOTE — Therapy (Signed)
OUTPATIENT PHYSICAL THERAPY TREATMENT NOTE/Physical Therapy Progress Note   Dates of reporting period  10/24/2021   to   12/05/2021   Patient Name: Alex Hampton MRN: [id number] DOB:October 25, 1955, 66 y.o., male Today's Date: 12/07/2021  PCP: Dr. Juluis Pitch REFERRING PROVIDER: Eulis Canner, MD   PT End of Session - 12/07/21 0743     Visit Number 60    Number of Visits 61    Date for PT Re-Evaluation 01/04/22    Authorization Type Medicare/BCBS; PN 03/23/21    Authorization Time Period Initial Cert= 87/86/7672- 09/47/0962; Recert 8/36/6294-7/65/4650: Recert 3/54/6568- 04/12/7515: Recert 0/0/1749- 4/49/6759    Progress Note Due on Visit 60    PT Start Time 1350    PT Stop Time 1428    PT Time Calculation (min) 38 min    Equipment Utilized During Treatment Gait belt    Activity Tolerance Patient limited by fatigue;Patient tolerated treatment well;No increased pain    Behavior During Therapy WFL for tasks assessed/performed                 Past Medical History:  Diagnosis Date   Depression    History of cardiac cath    History of heart artery stent    Hyperlipidemia    Hypertension    MI (myocardial infarction) ([place])    Renal disorder    Past Surgical History:  Procedure Laterality Date   IR [place] TUBE CHANGE  11/22/2020   IR [place] TUBE CHANGE  11/26/2020   IR [place] GASTRO/COLONIC TUBE PERCUT W/FLUORO  10/14/2020   LEFT HEART CATH AND CORONARY ANGIOGRAPHY N/A 08/19/2020   Procedure: LEFT HEART CATH AND CORONARY ANGIOGRAPHY;  Surgeon: Corey Skains, MD;  Location: [facility];  Service: Cardiovascular;  Laterality: N/A;   LIVER TRANSPLANT     Patient Active Problem List   Diagnosis Date Noted   Pulmonary embolism ([place]) 08/08/2021   CAP (community acquired pneumonia) 08/08/2021   Protein calorie malnutrition ([place]) 08/08/2021   Hypokalemia 08/08/2021   Pancreatic cyst    Verbal auditory hallucination    Early satiety    Feeding tube  dysfunction    Generalized weakness 11/26/2020   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[place]) 11/26/2020   Recurrent falls 11/26/2020   Syncope 10/15/2020   Uremia 10/10/2020   ESRD on hemodialysis ([place]) 10/10/2020   Hyperkalemia 10/10/2020   Elevated troponin 10/10/2020   GERD (gastroesophageal reflux disease) 10/10/2020   CAD (coronary artery disease) 10/10/2020   G tube feedings ([place]) 10/10/2020   Diarrhea 10/10/2020   Diabetes mellitus ([place]) 10/10/2020   Weakness    ACS (acute coronary syndrome) ([place])    Liver transplant recipient [facility])    Anemia of chronic disease    Type 2 diabetes mellitus with diabetic neuropathy, with long-term current use of insulin ([place])    ESRD (end stage renal disease) ([place]) 08/19/2020   History of anemia due to chronic kidney disease 08/19/2020   Gait abnormality 10/17/2019   History of fall within past 90 days 10/17/2019   Visual hallucination 10/17/2019   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 10/17/2019   HTN (hypertension) 10/17/2019   History of MI (myocardial infarction) 10/17/2019   OSA (obstructive sleep apnea) 10/17/2019   Nondisplaced comminuted supracondylar fracture without intercondylar fracture of left humerus, subsequent encounter for fracture with nonunion 10/17/2019   Anxiety and depression 10/17/2019    REFERRING DIAG: Other malaise Z94.4 (ICD-10-CM) - Liver transplant status R26.9 (ICD-10-CM) - Unspecified abnormalities of gait and  mobility   THERAPY DIAG:  Difficulty in walking, not elsewhere classified  Abnormality of gait and mobility  Muscle weakness (generalized)  Other abnormalities of gait and mobility  Other lack of coordination  Unsteadiness on feet  Repeated falls  Rationale for Evaluation and Treatment Rehabilitation  PERTINENT HISTORY: Patient is a 66 year old male with recent referral for abnomality of gait and recent liver transplant in November 2021. He has past medical history significant for Liver  transplant (02/18/2020), CKD, End stage renal disease- On hemodialyis T/TH/Sat., Anemia, DM with neuropathy, Non-stemi, Coronary artery disease, Feeding tube with poor appetite, PE now on eliquis   PRECAUTIONS: none  SUBJECTIVE: Patient reports no changes since last visit and states feeling okay today.   PAIN:  Are you having pain? No     TODAY'S TREATMENT:  12/05/2021   Therex:  Interval Nustep- at seat level 11 2 min 30 sec at L0 30 sec at L3 1 min 30 sec L0 30 sec at L3 1 min at L0  Reassessed goals -See goal section for today's interventions  Tandem standing in // bars up to 30 sec x multiple attempts each leg Tandem gait in // bars - down and back x 4.                 *Patient required brief rest break between each set  Education provided throughout session via VC/TC and demonstration to facilitate movement at target joints and correct muscle activation for all testing and exercises performed.        PATIENT EDUCATION: Education details: Exercise technique, progress towards remainder goals Person educated: Patient Education method: Explanation, Demonstration, Tactile cues, and Verbal cues Education comprehension: verbalized understanding, returned demonstration, verbal cues required, tactile cues required, and needs further education   HOME EXERCISE PROGRAM: No updates   PT Short Term Goals -      PT SHORT TERM GOAL #1   Title Pt will be independent with HEP in order to improve strength and balance in order to decrease fall risk and improve function at home and work.    Baseline 02/02/2021: Patient reports limited HEP in place currently from [facility] agency 12/14 compliance; 1/20/2023Patient states no questions regarding his current exercise regimen and states he is compliant.    Time 6    Period Weeks    Status Achieved    Target Date 03/16/21              PT Long Term Goals -       PT LONG TERM GOAL #1   Title Pt will improve FOTO to target score  of 50 to display perceived improvements in ability to complete ADL's.    Baseline 02/02/2021= 43 12/143: 51%; 04/29/2021= 53%; 09/09/2021= 53; 10/12/2021=54%   Time 12    Period Weeks    Status Achieved      PT LONG TERM GOAL #2   Title Pt will decrease 5TSTS by at least 8 seconds in order to demonstrate clinically significant improvement in LE strength.    Baseline 02/02/2021= 28.22 sec with BUE Support 12/14: 23.1 seconds; 04/29/2021=Unable to test today due to patient became nauseous with 6 min walk test and attempted TUG- unable to continue- will reassess next 1-2 visits; 06/08/2021= 25.1 sec with min BUE support; 07/20/2021= 17.75 with min BUE support    Time 12    Period Weeks    Status Achieved    Target Date 07/22/21      PT LONG TERM  GOAL #3   Title Pt will decrease TUG to below 19 seconds/decrease in order to demonstrate decreased fall risk.    Baseline 02/02/2021= 27.30 sec without AD 12/14: 23 seconds; 04/29/2021- Unable to assess as patient became very nauseated upon standing and request to stop treatment. 06/08/2021= 19.10 sec without an AD; 07/20/2021= 15.68 sec without UE Support    Time 12    Period Weeks    Status Achieved    Target Date 07/22/21      PT LONG TERM GOAL #4   Title Pt will increase 6MWT by at least 52m(1623f in order to demonstrate clinically significant improvement in cardiopulmonary endurance and community ambulation    Baseline 02/02/2021= 83 feet in 1 min 10 sec wihtout an AD 12/14: 200 ft in  6m38mtes; 04/29/2021=Patient ambulated approx 60 feet yet had to stop due onset of nausea and unable to complete today; 07/15/2021= 330 feet in 2 min 25 sec without AD.  09/09/2021= 350 feet in 3 min 45 sec. 10/12/2021= 710 feet in 6 min- no AD (Short stand rest break)    Time 12    Period Weeks    Status Achieved   Target Date 07/22/21      PT LONG TERM GOAL #5   Title Pt will increase 10MWT by at least 0.2 m/s in order to demonstrate clinically significant improvement in  community ambulation.    Baseline 02/02/2021= 0.42 m/s 12/14: 0.49 m/s; 04/29/2021=Patient unable to test today secondary to being nauseated. 06/08/2021= 0.62 m/s; 07/20/2021= 0.87 m/s    Time 12    Period Weeks    Status Achieved    Target Date 07/22/21      PT LONG TERM GOAL #6   Title Pt will decrease 5TSTS by at least 3 seconds in order to demonstrate clinically significant improvement in LE strength.    Baseline 07/20/2021= 17.75 sec with Minimal BUE Support; 09/09/2021= 17.50 sec with very minimal UE Support. 10/12/2021= 14.90 sec with min BUE Support. 12/05/2021= 14.30 sec with BUE    Time 12    Period Weeks    Status Ongoing   Target Date 10/12/21    PT LONG TERM GOAL #7 Title:  Pt will increase 6MWT by at least 6m57m4ft16f order to demonstrate clinically significant improvement in cardiopulmonary endurance and community ambulation                                                       Baseline: 10/12/2021= 710 feet; 12/05/2021= 827 feet Goal status: Progressing             Plan -      Clinical Impression Statement Patient presents with good motivation today and continues to require less breaks overall. He was reassessed today and continues to make steady gains both with LE strength and mobility/functional endurance. Patient's condition has the potential to improve in response to therapy. Maximum improvement is yet to be obtained. The anticipated improvement is attainable and reasonable in a generally predictable time.  Patient reports  Pt will continue to benefit from skilled physical therapy intervention to address impairments, improve QOL, and attain therapy goals.     Personal Factors and Comorbidities Comorbidity 3+    Comorbidities DM, Liver transplant, ESRD, CAD, feeding tube    Examination-Activity Limitations Carry;Lift;Reach Overhead;Squat;Stairs;Stand;Transfers;Toileting  Examination-Participation Restrictions Cleaning;Community Activity;Driving;Medication Management;Meal  Prep;Yard Work    [employer] officer Evolving/Moderate complexity    Rehab Potential Fair    PT Frequency 2x / week    PT Duration 12 weeks    PT Treatment/Interventions ADLs/Self Care Home Management;Cryotherapy;Moist Heat;DME Instruction;Gait training;Stair training;Functional mobility training;Therapeutic activities;Therapeutic exercise;Balance training;Neuromuscular re-education;Patient/family education;Wheelchair mobility training;Manual techniques;Passive range of motion;Dry needling;Energy conservation;Joint Manipulations    PT Next Visit Plan Progressive Therapeutic exercises, Balance training, transfer/gait training    PT Home Exercise Plan No changes or updates today.    Consulted and Agree with Plan of Care Patient              Lewis Moccasin PT  12/07/2021, 7:44 AM

## 2021-12-07 ENCOUNTER — Ambulatory Visit: Payer: Medicare Other

## 2021-12-07 DIAGNOSIS — R278 Other lack of coordination: Secondary | ICD-10-CM

## 2021-12-07 DIAGNOSIS — R2689 Other abnormalities of gait and mobility: Secondary | ICD-10-CM

## 2021-12-07 DIAGNOSIS — R2681 Unsteadiness on feet: Secondary | ICD-10-CM

## 2021-12-07 DIAGNOSIS — R262 Difficulty in walking, not elsewhere classified: Secondary | ICD-10-CM | POA: Diagnosis not present

## 2021-12-07 DIAGNOSIS — M6281 Muscle weakness (generalized): Secondary | ICD-10-CM

## 2021-12-07 DIAGNOSIS — R269 Unspecified abnormalities of gait and mobility: Secondary | ICD-10-CM

## 2021-12-07 NOTE — Therapy (Signed)
OUTPATIENT PHYSICAL THERAPY TREATMENT NOTE/Physical Therapy Progress Note   Dates of reporting period  10/24/2021   to   12/05/2021   Patient Name: Alex Hampton MRN: [id number] DOB:[birth year], 66 y.o., male Today's Date: 12/07/2021  PCP: Dr. Juluis Pitch REFERRING PROVIDER: Eulis Canner, MD   PT End of Session - 12/07/21 1348     Visit Number 61    Number of Visits 80    Date for PT Re-Evaluation 01/04/22    Authorization Type Medicare/BCBS; PN 03/23/21    Authorization Time Period Initial Cert= 52/77/8242- 35/36/1443; Recert 1/54/0086-7/61/9509: Recert 07/04/7122- 08/15/996: Recert 06/10/8248- 5/39/7673    Progress Note Due on Visit 60    PT Start Time 1345    PT Stop Time 1415    PT Time Calculation (min) 30 min    Equipment Utilized During Treatment Gait belt    Activity Tolerance Patient limited by fatigue;Patient tolerated treatment well;No increased pain    Behavior During Therapy WFL for tasks assessed/performed                 Past Medical History:  Diagnosis Date   Depression    History of cardiac cath    History of heart artery stent    Hyperlipidemia    Hypertension    MI (myocardial infarction) ([place])    Renal disorder    Past Surgical History:  Procedure Laterality Date   IR [place] TUBE CHANGE  11/22/2020   IR [place] TUBE CHANGE  11/26/2020   IR [place] GASTRO/COLONIC TUBE PERCUT W/FLUORO  10/14/2020   LEFT HEART CATH AND CORONARY ANGIOGRAPHY N/A 08/19/2020   Procedure: LEFT HEART CATH AND CORONARY ANGIOGRAPHY;  Surgeon: Corey Skains, MD;  Location: [place] [facility];  Service: Cardiovascular;  Laterality: N/A;   LIVER TRANSPLANT     Patient Active Problem List   Diagnosis Date Noted   Pulmonary embolism ([place]) 08/08/2021   CAP (community acquired pneumonia) 08/08/2021   Protein calorie malnutrition ([place]) 08/08/2021   Hypokalemia 08/08/2021   Pancreatic cyst    Verbal auditory hallucination    Early satiety    Feeding tube  dysfunction    Generalized weakness 11/26/2020   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[place]) 11/26/2020   Recurrent falls 11/26/2020   Syncope 10/15/2020   Uremia 10/10/2020   ESRD on hemodialysis ([place]) 10/10/2020   Hyperkalemia 10/10/2020   Elevated troponin 10/10/2020   GERD (gastroesophageal reflux disease) 10/10/2020   CAD (coronary artery disease) 10/10/2020   G tube feedings ([place]) 10/10/2020   Diarrhea 10/10/2020   Diabetes mellitus ([place]) 10/10/2020   Weakness    ACS (acute coronary syndrome) ([place])    Liver transplant recipient [facility])    Anemia of chronic disease    Type 2 diabetes mellitus with diabetic neuropathy, with long-term current use of insulin ([place])    ESRD (end stage renal disease) ([place]) 08/19/2020   History of anemia due to chronic kidney disease 08/19/2020   Gait abnormality 10/17/2019   History of fall within past 90 days 10/17/2019   Visual hallucination 10/17/2019   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 CT 10/17/2019   HTN (hypertension) 10/17/2019   History of MI (myocardial infarction) 10/17/2019   OSA (obstructive sleep apnea) 10/17/2019   Nondisplaced comminuted supracondylar fracture without intercondylar fracture of left humerus, subsequent encounter for fracture with nonunion 10/17/2019   Anxiety and depression 10/17/2019    REFERRING DIAG: Other malaise Z94.4 (ICD-10-CM) - Liver transplant status R26.9 (ICD-10-CM) - Unspecified abnormalities of gait and  mobility   THERAPY DIAG:  Difficulty in walking, not elsewhere classified  Abnormality of gait and mobility  Muscle weakness (generalized)  Other abnormalities of gait and mobility  Other lack of coordination  Unsteadiness on feet  Rationale for Evaluation and Treatment Rehabilitation  PERTINENT HISTORY: Patient is a 66 year old male with recent referral for abnomality of gait and recent liver transplant in November 2021. He has past medical history significant for Liver transplant (02/18/2020),  CKD, End stage renal disease- On hemodialyis T/TH/Sat., Anemia, DM with neuropathy, Non-stemi, Coronary artery disease, Feeding tube with poor appetite, PE now on eliquis   PRECAUTIONS: none  SUBJECTIVE: Patient reports no changes since last visit and states feeling okay today.   PAIN:  Are you having pain? No     TODAY'S TREATMENT:  12/05/2021   Therex:  HIIT Nustep- at seat level 11- VC to keep SPM > 60  1 min - L0 30 sec - L3 1 min- L1 30 sec - L3 1 min - L1 30 sec-L3 30 sec - L1 Total distance = 0.14 mi  Standing step up onto 6" block then forward off- retro back up to step then back down= 1 reps x 10 reps Sitting Chest press into scap row holding a 2kg yellow ball x 10 reps  Seated calf raises with 3# AW 2 sets of 10 reps  Seated toe raises with YTB resistance - 2 sets of 10 reps Standing hip abd up/over 1/2 foam roll x 10 reps (patient reports more fatigued today than usual)  Sit to stand from chair with airex pad in seat x 10 reps without UE support.  Forward and retro gait in // bars 3# AW down and back x 4 sets  *Patient requested to stop at this point citing undo fatigue as limiting factor.                     *Patient required brief rest break between each set  Education provided throughout session via VC/TC and demonstration to facilitate movement at target joints and correct muscle activation for all testing and exercises performed.        PATIENT EDUCATION: Education details: Exercise technique, progress towards remainder goals Person educated: Patient Education method: Explanation, Demonstration, Tactile cues, and Verbal cues Education comprehension: verbalized understanding, returned demonstration, verbal cues required, tactile cues required, and needs further education   HOME EXERCISE PROGRAM: No updates   PT Short Term Goals -      PT SHORT TERM GOAL #1   Title Pt will be independent with HEP in order to improve strength and balance in  order to decrease fall risk and improve function at home and work.    Baseline 02/02/2021: Patient reports limited HEP in place currently from [facility] agency 12/14 compliance; 1/20/2023Patient states no questions regarding his current exercise regimen and states he is compliant.    Time 6    Period Weeks    Status Achieved    Target Date 03/16/21              PT Long Term Goals -       PT LONG TERM GOAL #1   Title Pt will improve FOTO to target score of 50 to display perceived improvements in ability to complete ADL's.    Baseline 02/02/2021= 43 12/143: 51%; 04/29/2021= 53%; 09/09/2021= 53; 10/12/2021=54%   Time 12    Period Weeks    Status Achieved      PT  LONG TERM GOAL #2   Title Pt will decrease 5TSTS by at least 8 seconds in order to demonstrate clinically significant improvement in LE strength.    Baseline 02/02/2021= 28.22 sec with BUE Support 12/14: 23.1 seconds; 04/29/2021=Unable to test today due to patient became nauseous with 6 min walk test and attempted TUG- unable to continue- will reassess next 1-2 visits; 06/08/2021= 25.1 sec with min BUE support; 07/20/2021= 17.75 with min BUE support    Time 12    Period Weeks    Status Achieved    Target Date 07/22/21      PT LONG TERM GOAL #3   Title Pt will decrease TUG to below 19 seconds/decrease in order to demonstrate decreased fall risk.    Baseline 02/02/2021= 27.30 sec without AD 12/14: 23 seconds; 04/29/2021- Unable to assess as patient became very nauseated upon standing and request to stop treatment. 06/08/2021= 19.10 sec without an AD; 07/20/2021= 15.68 sec without UE Support    Time 12    Period Weeks    Status Achieved    Target Date 07/22/21      PT LONG TERM GOAL #4   Title Pt will increase 6MWT by at least 55m(1677f in order to demonstrate clinically significant improvement in cardiopulmonary endurance and community ambulation    Baseline 02/02/2021= 83 feet in 1 min 10 sec wihtout an AD 12/14: 200 ft in  75m66mtes;  04/29/2021=Patient ambulated approx 60 feet yet had to stop due onset of nausea and unable to complete today; 07/15/2021= 330 feet in 2 min 25 sec without AD.  09/09/2021= 350 feet in 3 min 45 sec. 10/12/2021= 710 feet in 6 min- no AD (Short stand rest break)    Time 12    Period Weeks    Status Achieved   Target Date 07/22/21      PT LONG TERM GOAL #5   Title Pt will increase 10MWT by at least 0.2 m/s in order to demonstrate clinically significant improvement in community ambulation.    Baseline 02/02/2021= 0.42 m/s 12/14: 0.49 m/s; 04/29/2021=Patient unable to test today secondary to being nauseated. 06/08/2021= 0.62 m/s; 07/20/2021= 0.87 m/s    Time 12    Period Weeks    Status Achieved    Target Date 07/22/21      PT LONG TERM GOAL #6   Title Pt will decrease 5TSTS by at least 3 seconds in order to demonstrate clinically significant improvement in LE strength.    Baseline 07/20/2021= 17.75 sec with Minimal BUE Support; 09/09/2021= 17.50 sec with very minimal UE Support. 10/12/2021= 14.90 sec with min BUE Support. 12/05/2021= 14.30 sec with BUE    Time 12    Period Weeks    Status Ongoing   Target Date 10/12/21    PT LONG TERM GOAL #7 Title:  Pt will increase 6MWT by at least 73m675m4ft41f order to demonstrate clinically significant improvement in cardiopulmonary endurance and community ambulation                                                       Baseline: 10/12/2021= 710 feet; 12/05/2021= 827 feet Goal status: Progressing             Plan -      Clinical Impression Statement Patient presents with good motivation today however continues  to be limited by weakness and fatigue. He reported after performing the Nustep that he felt as if it was harder today for some reason. He did progress through various therex but ultimately requested to end session due to fatigue. Pt will continue to benefit from skilled physical therapy intervention to address impairments, improve QOL, and attain therapy goals.      Personal Factors and Comorbidities Comorbidity 3+    Comorbidities DM, Liver transplant, ESRD, CAD, feeding tube    Examination-Activity Limitations Carry;Lift;Reach Overhead;Squat;Stairs;Stand;Transfers;Toileting    Examination-Participation Restrictions Cleaning;Community Activity;Driving;Medication Management;Meal Prep;Yard Work    [employer] officer Evolving/Moderate complexity    Rehab Potential Fair    PT Frequency 2x / week    PT Duration 12 weeks    PT Treatment/Interventions ADLs/Self Care Home Management;Cryotherapy;Moist Heat;DME Instruction;Gait training;Stair training;Functional mobility training;Therapeutic activities;Therapeutic exercise;Balance training;Neuromuscular re-education;Patient/family education;Wheelchair mobility training;Manual techniques;Passive range of motion;Dry needling;Energy conservation;Joint Manipulations    PT Next Visit Plan Progressive Therapeutic exercises, Balance training, transfer/gait training    PT Home Exercise Plan No changes or updates today.    Consulted and Agree with Plan of Care Patient              Lewis Moccasin PT  12/07/2021, 2:23 PM

## 2021-12-14 ENCOUNTER — Ambulatory Visit: Payer: Medicare Other | Attending: Gastroenterology

## 2021-12-14 DIAGNOSIS — R278 Other lack of coordination: Secondary | ICD-10-CM | POA: Insufficient documentation

## 2021-12-14 DIAGNOSIS — R262 Difficulty in walking, not elsewhere classified: Secondary | ICD-10-CM | POA: Insufficient documentation

## 2021-12-14 DIAGNOSIS — R2689 Other abnormalities of gait and mobility: Secondary | ICD-10-CM | POA: Insufficient documentation

## 2021-12-14 DIAGNOSIS — R2681 Unsteadiness on feet: Secondary | ICD-10-CM | POA: Insufficient documentation

## 2021-12-14 DIAGNOSIS — M6281 Muscle weakness (generalized): Secondary | ICD-10-CM | POA: Insufficient documentation

## 2021-12-14 DIAGNOSIS — R269 Unspecified abnormalities of gait and mobility: Secondary | ICD-10-CM | POA: Insufficient documentation

## 2021-12-14 DIAGNOSIS — R296 Repeated falls: Secondary | ICD-10-CM | POA: Insufficient documentation

## 2021-12-16 ENCOUNTER — Ambulatory Visit: Payer: Medicare Other

## 2021-12-19 ENCOUNTER — Ambulatory Visit: Payer: Medicare Other

## 2021-12-19 DIAGNOSIS — R278 Other lack of coordination: Secondary | ICD-10-CM | POA: Diagnosis present

## 2021-12-19 DIAGNOSIS — R2689 Other abnormalities of gait and mobility: Secondary | ICD-10-CM | POA: Diagnosis present

## 2021-12-19 DIAGNOSIS — R269 Unspecified abnormalities of gait and mobility: Secondary | ICD-10-CM

## 2021-12-19 DIAGNOSIS — M6281 Muscle weakness (generalized): Secondary | ICD-10-CM

## 2021-12-19 DIAGNOSIS — R262 Difficulty in walking, not elsewhere classified: Secondary | ICD-10-CM

## 2021-12-19 DIAGNOSIS — R2681 Unsteadiness on feet: Secondary | ICD-10-CM

## 2021-12-19 DIAGNOSIS — R296 Repeated falls: Secondary | ICD-10-CM

## 2021-12-19 NOTE — Therapy (Signed)
OUTPATIENT PHYSICAL THERAPY TREATMENT NOTE  Patient Name: Alex Hampton MRN: [id number] DOB:08/30/1955, 66 y.o., male Today's Date: 12/20/2021  PCP: Dr. Juluis Pitch REFERRING PROVIDER: Eulis Canner, MD   PT End of Session - 12/19/21 1435     Visit Number 2    Number of Visits 19    Date for PT Re-Evaluation 01/04/22    Authorization Type Medicare/BCBS; PN 03/23/21    Authorization Time Period Initial Cert= 17/00/1749- 44/96/7591; Recert 6/38/4665-9/93/5701: Recert 7/79/3903- 0/0/9233: Recert 0/0/7622- 6/33/3545    Progress Note Due on Visit 60    PT Start Time 1432    PT Stop Time 1511    PT Time Calculation (min) 39 min    Equipment Utilized During Treatment Gait belt    Activity Tolerance Patient limited by fatigue;Patient tolerated treatment well;No increased pain    Behavior During Therapy WFL for tasks assessed/performed                  Past Medical History:  Diagnosis Date   Depression    History of cardiac cath    History of heart artery stent    Hyperlipidemia    Hypertension    MI (myocardial infarction) ([place])    Renal disorder    Past Surgical History:  Procedure Laterality Date   IR [place] TUBE CHANGE  11/22/2020   IR [place] TUBE CHANGE  11/26/2020   IR [place] GASTRO/COLONIC TUBE PERCUT W/FLUORO  10/14/2020   LEFT HEART CATH AND CORONARY ANGIOGRAPHY N/A 08/19/2020   Procedure: LEFT HEART CATH AND CORONARY ANGIOGRAPHY;  Surgeon: Corey Skains, MD;  Location: [facility];  Service: Cardiovascular;  Laterality: N/A;   LIVER TRANSPLANT     Patient Active Problem List   Diagnosis Date Noted   Pulmonary embolism ([place]) 08/08/2021   CAP (community acquired pneumonia) 08/08/2021   Protein calorie malnutrition ([place]) 08/08/2021   Hypokalemia 08/08/2021   Pancreatic cyst    Verbal auditory hallucination    Early satiety    Feeding tube dysfunction    Generalized weakness 11/26/2020   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[place]) 11/26/2020    Recurrent falls 11/26/2020   Syncope 10/15/2020   Uremia 10/10/2020   ESRD on hemodialysis ([place]) 10/10/2020   Hyperkalemia 10/10/2020   Elevated troponin 10/10/2020   GERD (gastroesophageal reflux disease) 10/10/2020   CAD (coronary artery disease) 10/10/2020   G tube feedings ([place]) 10/10/2020   Diarrhea 10/10/2020   Diabetes mellitus ([place]) 10/10/2020   Weakness    ACS (acute coronary syndrome) ([place])    Liver transplant recipient [facility])    Anemia of chronic disease    Type 2 diabetes mellitus with diabetic neuropathy, with long-term current use of insulin ([place])    ESRD (end stage renal disease) ([place]) 08/19/2020   History of anemia due to chronic kidney disease 08/19/2020   Gait abnormality 10/17/2019   History of fall within past 90 days 10/17/2019   Visual hallucination 10/17/2019   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 10/17/2019   HTN (hypertension) 10/17/2019   History of MI (myocardial infarction) 10/17/2019   OSA (obstructive sleep apnea) 10/17/2019   Nondisplaced comminuted supracondylar fracture without intercondylar fracture of left humerus, subsequent encounter for fracture with nonunion 10/17/2019   Anxiety and depression 10/17/2019    REFERRING DIAG: Other malaise Z94.4 (ICD-10-CM) - Liver transplant status R26.9 (ICD-10-CM) - Unspecified abnormalities of gait and mobility   THERAPY DIAG:  Difficulty in walking, not elsewhere classified  Abnormality of gait and  mobility  Muscle weakness (generalized)  Other abnormalities of gait and mobility  Other lack of coordination  Unsteadiness on feet  Repeated falls  Rationale for Evaluation and Treatment Rehabilitation  PERTINENT HISTORY: Patient is a 66 year old male with recent referral for abnomality of gait and recent liver transplant in November 2021. He has past medical history significant for Liver transplant (02/18/2020), CKD, End stage renal disease- On hemodialyis T/TH/Sat., Anemia, DM with  neuropathy, Non-stemi, Coronary artery disease, Feeding tube with poor appetite, PE now on eliquis   PRECAUTIONS: none  SUBJECTIVE: Patient reports doing okay- complaining of minor stomach discomfort.   PAIN:  Are you having pain? No     TODAY'S TREATMENT:  12/19/2021   Therex:  Step tap for 2 min = 23 steps on right LE Standing step up onto 6" block then forward off- retro back up to step then back down= 1 reps x 10 reps Precor Leg press- 40lb 2 sets x 12 reps  Precor calf raises 40 lb 2 sets of 12 reps  Seated toe raises with YTB resistance - 2 sets of 10 reps Sitting hip abd up/over 1/2 foam roll with 3lb x 10 reps x 2 sets (patient reported mild anterior upper thigh soreness on left side)   Sit to stand from chair with airex pad in seat x 10 reps without UE support.  Side step at support (double step with YTB tied around distal thighs) and using 3lb AW x 5 each direction Seated hip march onto 6" step block with 3lb AW alt 2 sets of 10 reps.                       *Patient required brief rest break between each set  Education provided throughout session via VC/TC and demonstration to facilitate movement at target joints and correct muscle activation for all testing and exercises performed.        PATIENT EDUCATION: Education details: Exercise technique, progress towards remainder goals Person educated: Patient Education method: Explanation, Demonstration, Tactile cues, and Verbal cues Education comprehension: verbalized understanding, returned demonstration, verbal cues required, tactile cues required, and needs further education   HOME EXERCISE PROGRAM: No updates   PT Short Term Goals -      PT SHORT TERM GOAL #1   Title Pt will be independent with HEP in order to improve strength and balance in order to decrease fall risk and improve function at home and work.    Baseline 02/02/2021: Patient reports limited HEP in place currently from [facility] agency 12/14  compliance; 1/20/2023Patient states no questions regarding his current exercise regimen and states he is compliant.    Time 6    Period Weeks    Status Achieved    Target Date 03/16/21              PT Long Term Goals -       PT LONG TERM GOAL #1   Title Pt will improve FOTO to target score of 50 to display perceived improvements in ability to complete ADL's.    Baseline 02/02/2021= 43 12/143: 51%; 04/29/2021= 53%; 09/09/2021= 53; 10/12/2021=54%   Time 12    Period Weeks    Status Achieved      PT LONG TERM GOAL #2   Title Pt will decrease 5TSTS by at least 8 seconds in order to demonstrate clinically significant improvement in LE strength.    Baseline 02/02/2021= 28.22 sec with BUE Support 12/14: 23.1  seconds; 04/29/2021=Unable to test today due to patient became nauseous with 6 min walk test and attempted TUG- unable to continue- will reassess next 1-2 visits; 06/08/2021= 25.1 sec with min BUE support; 07/20/2021= 17.75 with min BUE support    Time 12    Period Weeks    Status Achieved    Target Date 07/22/21      PT LONG TERM GOAL #3   Title Pt will decrease TUG to below 19 seconds/decrease in order to demonstrate decreased fall risk.    Baseline 02/02/2021= 27.30 sec without AD 12/14: 23 seconds; 04/29/2021- Unable to assess as patient became very nauseated upon standing and request to stop treatment. 06/08/2021= 19.10 sec without an AD; 07/20/2021= 15.68 sec without UE Support    Time 12    Period Weeks    Status Achieved    Target Date 07/22/21      PT LONG TERM GOAL #4   Title Pt will increase 6MWT by at least 64m(165f in order to demonstrate clinically significant improvement in cardiopulmonary endurance and community ambulation    Baseline 02/02/2021= 83 feet in 1 min 10 sec wihtout an AD 12/14: 200 ft in  61m361mtes; 04/29/2021=Patient ambulated approx 60 feet yet had to stop due onset of nausea and unable to complete today; 07/15/2021= 330 feet in 2 min 25 sec without AD.   09/09/2021= 350 feet in 3 min 45 sec. 10/12/2021= 710 feet in 6 min- no AD (Short stand rest break)    Time 12    Period Weeks    Status Achieved   Target Date 07/22/21      PT LONG TERM GOAL #5   Title Pt will increase 10MWT by at least 0.2 m/s in order to demonstrate clinically significant improvement in community ambulation.    Baseline 02/02/2021= 0.42 m/s 12/14: 0.49 m/s; 04/29/2021=Patient unable to test today secondary to being nauseated. 06/08/2021= 0.62 m/s; 07/20/2021= 0.87 m/s    Time 12    Period Weeks    Status Achieved    Target Date 07/22/21      PT LONG TERM GOAL #6   Title Pt will decrease 5TSTS by at least 3 seconds in order to demonstrate clinically significant improvement in LE strength.    Baseline 07/20/2021= 17.75 sec with Minimal BUE Support; 09/09/2021= 17.50 sec with very minimal UE Support. 10/12/2021= 14.90 sec with min BUE Support. 12/05/2021= 14.30 sec with BUE    Time 12    Period Weeks    Status Ongoing   Target Date 10/12/21    PT LONG TERM GOAL #7 Title:  Pt will increase 6MWT by at least 64m22m4ft70f order to demonstrate clinically significant improvement in cardiopulmonary endurance and community ambulation                                                       Baseline: 10/12/2021= 710 feet; 12/05/2021= 827 feet Goal status: Progressing             Plan -      Clinical Impression Statement Patient was able to progress LE strengthening today and able to perform Leg press and calf press without significant difficulty. He was fatigued overall- yet able to work through several seated/standing activities without report of increased stomach issues.  Pt will continue to benefit from skilled  physical therapy intervention to address impairments, improve QOL, and attain therapy goals.     Personal Factors and Comorbidities Comorbidity 3+    Comorbidities DM, Liver transplant, ESRD, CAD, feeding tube    Examination-Activity Limitations Carry;Lift;Reach  Overhead;Squat;Stairs;Stand;Transfers;Toileting    Examination-Participation Restrictions Cleaning;Community Activity;Driving;Medication Management;Meal Prep;Yard Work    [employer] Evolving/Moderate complexity    Rehab Potential Fair    PT Frequency 2x / week    PT Duration 12 weeks    PT Treatment/Interventions ADLs/Self Care Home Management;Cryotherapy;Moist Heat;DME Instruction;Gait training;Stair training;Functional mobility training;Therapeutic activities;Therapeutic exercise;Balance training;Neuromuscular re-education;Patient/family education;Wheelchair mobility training;Manual techniques;Passive range of motion;Dry needling;Energy conservation;Joint Manipulations    PT Next Visit Plan Progressive Therapeutic exercises, Balance training, transfer/gait training    PT Home Exercise Plan No changes or updates today.    Consulted and Agree with Plan of Care Patient              Lewis Moccasin PT  12/20/2021, 8:37 AM

## 2021-12-21 ENCOUNTER — Ambulatory Visit: Payer: Medicare Other

## 2021-12-26 ENCOUNTER — Ambulatory Visit: Payer: Medicare Other

## 2021-12-26 DIAGNOSIS — R262 Difficulty in walking, not elsewhere classified: Secondary | ICD-10-CM | POA: Diagnosis not present

## 2021-12-26 DIAGNOSIS — R278 Other lack of coordination: Secondary | ICD-10-CM

## 2021-12-26 DIAGNOSIS — R2689 Other abnormalities of gait and mobility: Secondary | ICD-10-CM

## 2021-12-26 DIAGNOSIS — R296 Repeated falls: Secondary | ICD-10-CM

## 2021-12-26 DIAGNOSIS — R2681 Unsteadiness on feet: Secondary | ICD-10-CM

## 2021-12-26 DIAGNOSIS — R269 Unspecified abnormalities of gait and mobility: Secondary | ICD-10-CM

## 2021-12-26 DIAGNOSIS — M6281 Muscle weakness (generalized): Secondary | ICD-10-CM

## 2021-12-26 NOTE — Therapy (Signed)
OUTPATIENT PHYSICAL THERAPY TREATMENT NOTE  Patient Name: Alex Hampton MRN: [id number] DOB:1955/12/15, 66 y.o., male Today's Date: 12/26/2021  PCP: Dr. Juluis Pitch REFERRING PROVIDER: Eulis Canner, MD   PT End of Session - 12/26/21 1349     Visit Number 63    Number of Visits 50    Date for PT Re-Evaluation 01/04/22    Authorization Type Medicare/BCBS; PN 03/23/21    Authorization Time Period Initial Cert= 66/44/0347- 42/59/5638; Recert 7/56/4332-9/51/8841: Recert 6/60/6301- 6/0/1093: Recert 05/14/5571- 05/30/2540    Progress Note Due on Visit 60    PT Start Time 1346    PT Stop Time 1424    PT Time Calculation (min) 38 min    Equipment Utilized During Treatment Gait belt    Activity Tolerance Patient limited by fatigue;Patient tolerated treatment well;No increased pain    Behavior During Therapy WFL for tasks assessed/performed                   Past Medical History:  Diagnosis Date   Depression    History of cardiac cath    History of heart artery stent    Hyperlipidemia    Hypertension    MI (myocardial infarction) ([place])    Renal disorder    Past Surgical History:  Procedure Laterality Date   IR GJ TUBE CHANGE  11/22/2020   IR [place] TUBE CHANGE  11/26/2020   IR [place] GASTRO/COLONIC TUBE PERCUT W/FLUORO  10/14/2020   LEFT HEART CATH AND CORONARY ANGIOGRAPHY N/A 08/19/2020   Procedure: LEFT HEART CATH AND CORONARY ANGIOGRAPHY;  Surgeon: Corey Skains, MD;  Location: [facility];  Service: Cardiovascular;  Laterality: N/A;   LIVER TRANSPLANT     Patient Active Problem List   Diagnosis Date Noted   Pulmonary embolism ([place]) 08/08/2021   CAP (community acquired pneumonia) 08/08/2021   Protein calorie malnutrition ([place]) 08/08/2021   Hypokalemia 08/08/2021   Pancreatic cyst    Verbal auditory hallucination    Early satiety    Feeding tube dysfunction    Generalized weakness 11/26/2020   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[place]) 11/26/2020    Recurrent falls 11/26/2020   Syncope 10/15/2020   Uremia 10/10/2020   ESRD on hemodialysis ([place]) 10/10/2020   Hyperkalemia 10/10/2020   Elevated troponin 10/10/2020   GERD (gastroesophageal reflux disease) 10/10/2020   CAD (coronary artery disease) 10/10/2020   G tube feedings ([place]) 10/10/2020   Diarrhea 10/10/2020   Diabetes mellitus ([place]) 10/10/2020   Weakness    ACS (acute coronary syndrome) ([place])    Liver transplant recipient [facility])    Anemia of chronic disease    Type 2 diabetes mellitus with diabetic neuropathy, with long-term current use of insulin ([place])    ESRD (end stage renal disease) ([place]) 08/19/2020   History of anemia due to chronic kidney disease 08/19/2020   Gait abnormality 10/17/2019   History of fall within past 90 days 10/17/2019   Visual hallucination 10/17/2019   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 10/17/2019   HTN (hypertension) 10/17/2019   History of MI (myocardial infarction) 10/17/2019   OSA (obstructive sleep apnea) 10/17/2019   Nondisplaced comminuted supracondylar fracture without intercondylar fracture of left humerus, subsequent encounter for fracture with nonunion 10/17/2019   Anxiety and depression 10/17/2019    REFERRING DIAG: Other malaise Z94.4 (ICD-10-CM) - Liver transplant status R26.9 (ICD-10-CM) - Unspecified abnormalities of gait and mobility   THERAPY DIAG:  Difficulty in walking, not elsewhere classified  Abnormality of gait  and mobility  Muscle weakness (generalized)  Other abnormalities of gait and mobility  Other lack of coordination  Unsteadiness on feet  Repeated falls  Rationale for Evaluation and Treatment Rehabilitation  PERTINENT HISTORY: Patient is a 66 year old male with recent referral for abnomality of gait and recent liver transplant in November 2021. He has past medical history significant for Liver transplant (02/18/2020), CKD, End stage renal disease- On hemodialyis T/TH/Sat., Anemia, DM with  neuropathy, Non-stemi, Coronary artery disease, Feeding tube with poor appetite, PE now on eliquis   PRECAUTIONS: none  SUBJECTIVE: Patient reports feeling okay so far this week. States so far stomach is okay   PAIN:  Are you having pain? No     TODAY'S TREATMENT:  12/26/2021   Therex:   Biodex Treadmill - at 1.0 mph x 3 min at 0.05 mi- stopped due to undo fatigue. Patient then continued for round 2 another 4 min for 0.06 mi= Total of 7 min and 0.11 mi.  Precor Leg press- 40lb 1sets x 12 reps; 1 set 40 lb x 10 reps; 2 sets of 55 x 8 reps Precor calf raises 40 lb 2 sets of 12 reps  Seated scap rows with GTB 2 sets of 12 reps Chair dips 2 sets of 12 reps (patient reports as challenging)  Seated hip march/tap onto 12" block alt LE x 12 reps x 2 sets                       *Patient required brief rest break between each set  Education provided throughout session via VC/TC and demonstration to facilitate movement at target joints and correct muscle activation for all testing and exercises performed.        PATIENT EDUCATION: Education details: Exercise technique, progress towards remainder goals Person educated: Patient Education method: Explanation, Demonstration, Tactile cues, and Verbal cues Education comprehension: verbalized understanding, returned demonstration, verbal cues required, tactile cues required, and needs further education   HOME EXERCISE PROGRAM: No updates   PT Short Term Goals -      PT SHORT TERM GOAL #1   Title Pt will be independent with HEP in order to improve strength and balance in order to decrease fall risk and improve function at home and work.    Baseline 02/02/2021: Patient reports limited HEP in place currently from [facility] agency 12/14 compliance; 1/20/2023Patient states no questions regarding his current exercise regimen and states he is compliant.    Time 6    Period Weeks    Status Achieved    Target Date 03/16/21               PT Long Term Goals -       PT LONG TERM GOAL #1   Title Pt will improve FOTO to target score of 50 to display perceived improvements in ability to complete ADL's.    Baseline 02/02/2021= 43 12/143: 51%; 04/29/2021= 53%; 09/09/2021= 53; 10/12/2021=54%   Time 12    Period Weeks    Status Achieved      PT LONG TERM GOAL #2   Title Pt will decrease 5TSTS by at least 8 seconds in order to demonstrate clinically significant improvement in LE strength.    Baseline 02/02/2021= 28.22 sec with BUE Support 12/14: 23.1 seconds; 04/29/2021=Unable to test today due to patient became nauseous with 6 min walk test and attempted TUG- unable to continue- will reassess next 1-2 visits; 06/08/2021= 25.1 sec with min BUE support;  07/20/2021= 17.75 with min BUE support    Time 12    Period Weeks    Status Achieved    Target Date 07/22/21      PT LONG TERM GOAL #3   Title Pt will decrease TUG to below 19 seconds/decrease in order to demonstrate decreased fall risk.    Baseline 02/02/2021= 27.30 sec without AD 12/14: 23 seconds; 04/29/2021- Unable to assess as patient became very nauseated upon standing and request to stop treatment. 06/08/2021= 19.10 sec without an AD; 07/20/2021= 15.68 sec without UE Support    Time 12    Period Weeks    Status Achieved    Target Date 07/22/21      PT LONG TERM GOAL #4   Title Pt will increase 6MWT by at least 28m(1647f in order to demonstrate clinically significant improvement in cardiopulmonary endurance and community ambulation    Baseline 02/02/2021= 83 feet in 1 min 10 sec wihtout an AD 12/14: 200 ft in  62m75mtes; 04/29/2021=Patient ambulated approx 60 feet yet had to stop due onset of nausea and unable to complete today; 07/15/2021= 330 feet in 2 min 25 sec without AD.  09/09/2021= 350 feet in 3 min 45 sec. 10/12/2021= 710 feet in 6 min- no AD (Short stand rest break)    Time 12    Period Weeks    Status Achieved   Target Date 07/22/21      PT LONG TERM GOAL #5   Title Pt will  increase 10MWT by at least 0.2 m/s in order to demonstrate clinically significant improvement in community ambulation.    Baseline 02/02/2021= 0.42 m/s 12/14: 0.49 m/s; 04/29/2021=Patient unable to test today secondary to being nauseated. 06/08/2021= 0.62 m/s; 07/20/2021= 0.87 m/s    Time 12    Period Weeks    Status Achieved    Target Date 07/22/21      PT LONG TERM GOAL #6   Title Pt will decrease 5TSTS by at least 3 seconds in order to demonstrate clinically significant improvement in LE strength.    Baseline 07/20/2021= 17.75 sec with Minimal BUE Support; 09/09/2021= 17.50 sec with very minimal UE Support. 10/12/2021= 14.90 sec with min BUE Support. 12/05/2021= 14.30 sec with BUE    Time 12    Period Weeks    Status Ongoing   Target Date 10/12/21    PT LONG TERM GOAL #7 Title:  Pt will increase 6MWT by at least 65m55m4ft54f order to demonstrate clinically significant improvement in cardiopulmonary endurance and community ambulation                                                       Baseline: 10/12/2021= 710 feet; 12/05/2021= 827 feet Goal status: Progressing             Plan -      Clinical Impression Statement Patient continues to focus on mm strength and endurance. He was able to progress to treadmill for 1st time and challenge himself with walking endurance and progress resistance and reps on leg press machine.   Pt will continue to benefit from skilled physical therapy intervention to address impairments, improve QOL, and attain therapy goals.     Personal Factors and Comorbidities Comorbidity 3+    Comorbidities DM, Liver transplant, ESRD, CAD, feeding tube  Examination-Activity Limitations Carry;Lift;Reach Overhead;Squat;Stairs;Stand;Transfers;Toileting    Examination-Participation Restrictions Cleaning;Community Activity;Driving;Medication Management;Meal Prep;Yard Work    [employer] Evolving/Moderate complexity    Rehab Potential Fair    PT Frequency  2x / week    PT Duration 12 weeks    PT Treatment/Interventions ADLs/Self Care Home Management;Cryotherapy;Moist Heat;DME Instruction;Gait training;Stair training;Functional mobility training;Therapeutic activities;Therapeutic exercise;Balance training;Neuromuscular re-education;Patient/family education;Wheelchair mobility training;Manual techniques;Passive range of motion;Dry needling;Energy conservation;Joint Manipulations    PT Next Visit Plan Progressive Therapeutic exercises, Balance training, transfer/gait training    PT Home Exercise Plan No changes or updates today.    Consulted and Agree with Plan of Care Patient              Lewis Moccasin PT  12/26/2021, 3:19 PM

## 2021-12-28 ENCOUNTER — Ambulatory Visit: Payer: Medicare Other

## 2022-01-02 ENCOUNTER — Ambulatory Visit: Payer: Medicare Other

## 2022-01-02 DIAGNOSIS — R278 Other lack of coordination: Secondary | ICD-10-CM

## 2022-01-02 DIAGNOSIS — R296 Repeated falls: Secondary | ICD-10-CM

## 2022-01-02 DIAGNOSIS — R269 Unspecified abnormalities of gait and mobility: Secondary | ICD-10-CM

## 2022-01-02 DIAGNOSIS — R2681 Unsteadiness on feet: Secondary | ICD-10-CM

## 2022-01-02 DIAGNOSIS — R262 Difficulty in walking, not elsewhere classified: Secondary | ICD-10-CM | POA: Diagnosis not present

## 2022-01-02 DIAGNOSIS — R2689 Other abnormalities of gait and mobility: Secondary | ICD-10-CM

## 2022-01-02 DIAGNOSIS — M6281 Muscle weakness (generalized): Secondary | ICD-10-CM

## 2022-01-02 NOTE — Therapy (Signed)
OUTPATIENT PHYSICAL THERAPY TREATMENT NOTE  Patient Name: Alex Hampton MRN: [id number] DOB:1955/11/21, 66 y.o., male Today's Date: 01/02/2022  PCP: Dr. Juluis Pitch REFERRING PROVIDER: Eulis Canner, MD   PT End of Session - 01/02/22 1352     Visit Number 21    Number of Visits 59    Date for PT Re-Evaluation 01/04/22    Authorization Type Medicare/BCBS; PN 03/23/21    Authorization Time Period Initial Cert= 40/98/1191- 47/82/9562; Recert 05/10/8655-8/46/9629: Recert 09/05/4130- 07/12/100: Recert 10/09/5364- 4/40/3474    Progress Note Due on Visit --    PT Start Time 1348    PT Stop Time 1428    PT Time Calculation (min) 40 min    Equipment Utilized During Treatment Gait belt    Activity Tolerance Patient limited by fatigue;Patient tolerated treatment well;No increased pain    Behavior During Therapy WFL for tasks assessed/performed                   Past Medical History:  Diagnosis Date   Depression    History of cardiac cath    History of heart artery stent    Hyperlipidemia    Hypertension    MI (myocardial infarction) ([place])    Renal disorder    Past Surgical History:  Procedure Laterality Date   IR [place] TUBE CHANGE  11/22/2020   IR [place] TUBE CHANGE  11/26/2020   IR [place] GASTRO/COLONIC TUBE PERCUT W/FLUORO  10/14/2020   LEFT HEART CATH AND CORONARY ANGIOGRAPHY N/A 08/19/2020   Procedure: LEFT HEART CATH AND CORONARY ANGIOGRAPHY;  Surgeon: Corey Skains, MD;  Location: [facility];  Service: Cardiovascular;  Laterality: N/A;   LIVER TRANSPLANT     Patient Active Problem List   Diagnosis Date Noted   Pulmonary embolism ([place]) 08/08/2021   CAP (community acquired pneumonia) 08/08/2021   Protein calorie malnutrition ([place]) 08/08/2021   Hypokalemia 08/08/2021   Pancreatic cyst    Verbal auditory hallucination    Early satiety    Feeding tube dysfunction    Generalized weakness 11/26/2020   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[place]) 11/26/2020    Recurrent falls 11/26/2020   Syncope 10/15/2020   Uremia 10/10/2020   ESRD on hemodialysis ([place]) 10/10/2020   Hyperkalemia 10/10/2020   Elevated troponin 10/10/2020   GERD (gastroesophageal reflux disease) 10/10/2020   CAD (coronary artery disease) 10/10/2020   G tube feedings ([place]) 10/10/2020   Diarrhea 10/10/2020   Diabetes mellitus ([place]) 10/10/2020   Weakness    ACS (acute coronary syndrome) ([place])    Liver transplant recipient [facility])    Anemia of chronic disease    Type 2 diabetes mellitus with diabetic neuropathy, with long-term current use of insulin ([place])    ESRD (end stage renal disease) ([place]) 08/19/2020   History of anemia due to chronic kidney disease 08/19/2020   Gait abnormality 10/17/2019   History of fall within past 90 days 10/17/2019   Visual hallucination 10/17/2019   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 10/17/2019   HTN (hypertension) 10/17/2019   History of MI (myocardial infarction) 10/17/2019   OSA (obstructive sleep apnea) 10/17/2019   Nondisplaced comminuted supracondylar fracture without intercondylar fracture of left humerus, subsequent encounter for fracture with nonunion 10/17/2019   Anxiety and depression 10/17/2019    REFERRING DIAG: Other malaise Z94.4 (ICD-10-CM) - Liver transplant status R26.9 (ICD-10-CM) - Unspecified abnormalities of gait and mobility   THERAPY DIAG:  Difficulty in walking, not elsewhere classified  Abnormality of gait  and mobility  Muscle weakness (generalized)  Other abnormalities of gait and mobility  Other lack of coordination  Unsteadiness on feet  Repeated falls  Rationale for Evaluation and Treatment Rehabilitation  PERTINENT HISTORY: Patient is a 66 year old male with recent referral for abnomality of gait and recent liver transplant in November 2021. He has past medical history significant for Liver transplant (02/18/2020), CKD, End stage renal disease- On hemodialyis T/TH/Sat., Anemia, DM with  neuropathy, Non-stemi, Coronary artery disease, Feeding tube with poor appetite, PE now on eliquis   PRECAUTIONS: none  SUBJECTIVE: Patient reports feeling okay so far this week. States so far stomach is okay   PAIN:  Are you having pain? No     TODAY'S TREATMENT:  12/26/2021   Therex:    Standing hip march/tap onto 6" block alt LE x 2 min without UE support = 38 steps- able to complete 2 min today without rest  Step tap onto colored cone (PT would call out color and patient would step tap onto corresponding color- attempting without UE support (more difficulty standing on left LE)  Standing hip flex/abd up and over 1/2 foam x 12 reps each direction Standing- step up/over forward/backward over 1/2 foam x 12 reps each direction - Patient with mild unsteadiness and requested seated rest break. Word game activities at dry erase board- alphabet and wheel of fortune type game.- with standing in various feet position- near tandem standing.   Single leg stand with forward reaching for cone positioned on top of 6" block- Patient attempted yet unable to perform without UE support.                  *Patient required brief rest break between each set  Education provided throughout session via VC/TC and demonstration to facilitate movement at target joints and correct muscle activation for all testing and exercises performed.        PATIENT EDUCATION: Education details: Exercise technique, progress towards remainder goals Person educated: Patient Education method: Explanation, Demonstration, Tactile cues, and Verbal cues Education comprehension: verbalized understanding, returned demonstration, verbal cues required, tactile cues required, and needs further education   HOME EXERCISE PROGRAM: No updates   PT Short Term Goals -      PT SHORT TERM GOAL #1   Title Pt will be independent with HEP in order to improve strength and balance in order to decrease fall risk and improve  function at home and work.    Baseline 02/02/2021: Patient reports limited HEP in place currently from [facility] agency 12/14 compliance; 1/20/2023Patient states no questions regarding his current exercise regimen and states he is compliant.    Time 6    Period Weeks    Status Achieved    Target Date 03/16/21              PT Long Term Goals -       PT LONG TERM GOAL #1   Title Pt will improve FOTO to target score of 50 to display perceived improvements in ability to complete ADL's.    Baseline 02/02/2021= 43 12/143: 51%; 04/29/2021= 53%; 09/09/2021= 53; 10/12/2021=54%   Time 12    Period Weeks    Status Achieved      PT LONG TERM GOAL #2   Title Pt will decrease 5TSTS by at least 8 seconds in order to demonstrate clinically significant improvement in LE strength.    Baseline 02/02/2021= 28.22 sec with BUE Support 12/14: 23.1 seconds; 04/29/2021=Unable to test today  due to patient became nauseous with 6 min walk test and attempted TUG- unable to continue- will reassess next 1-2 visits; 06/08/2021= 25.1 sec with min BUE support; 07/20/2021= 17.75 with min BUE support    Time 12    Period Weeks    Status Achieved    Target Date 07/22/21      PT LONG TERM GOAL #3   Title Pt will decrease TUG to below 19 seconds/decrease in order to demonstrate decreased fall risk.    Baseline 02/02/2021= 27.30 sec without AD 12/14: 23 seconds; 04/29/2021- Unable to assess as patient became very nauseated upon standing and request to stop treatment. 06/08/2021= 19.10 sec without an AD; 07/20/2021= 15.68 sec without UE Support    Time 12    Period Weeks    Status Achieved    Target Date 07/22/21      PT LONG TERM GOAL #4   Title Pt will increase 6MWT by at least 18m(1662f in order to demonstrate clinically significant improvement in cardiopulmonary endurance and community ambulation    Baseline 02/02/2021= 83 feet in 1 min 10 sec wihtout an AD 12/14: 200 ft in  70m41mtes; 04/29/2021=Patient ambulated approx 60 feet  yet had to stop due onset of nausea and unable to complete today; 07/15/2021= 330 feet in 2 min 25 sec without AD.  09/09/2021= 350 feet in 3 min 45 sec. 10/12/2021= 710 feet in 6 min- no AD (Short stand rest break)    Time 12    Period Weeks    Status Achieved   Target Date 07/22/21      PT LONG TERM GOAL #5   Title Pt will increase 10MWT by at least 0.2 m/s in order to demonstrate clinically significant improvement in community ambulation.    Baseline 02/02/2021= 0.42 m/s 12/14: 0.49 m/s; 04/29/2021=Patient unable to test today secondary to being nauseated. 06/08/2021= 0.62 m/s; 07/20/2021= 0.87 m/s    Time 12    Period Weeks    Status Achieved    Target Date 07/22/21      PT LONG TERM GOAL #6   Title Pt will decrease 5TSTS by at least 3 seconds in order to demonstrate clinically significant improvement in LE strength.    Baseline 07/20/2021= 17.75 sec with Minimal BUE Support; 09/09/2021= 17.50 sec with very minimal UE Support. 10/12/2021= 14.90 sec with min BUE Support. 12/05/2021= 14.30 sec with BUE    Time 12    Period Weeks    Status Ongoing   Target Date 10/12/21    PT LONG TERM GOAL #7 Title:  Pt will increase 6MWT by at least 40m68m4ft11f order to demonstrate clinically significant improvement in cardiopulmonary endurance and community ambulation                                                       Baseline: 10/12/2021= 710 feet; 12/05/2021= 827 feet Goal status: Progressing             Plan -      Clinical Impression Statement Patient performed well- able to progress with overall standing activities and less rest breaks. He continues to be mostly limited by fatigue- did present with some difficulty with dynamic balance- unable to reach down for forward lean without UE.   Pt will continue to benefit from skilled physical therapy intervention  to address impairments, improve QOL, and attain therapy goals.     Personal Factors and Comorbidities Comorbidity 3+    Comorbidities DM, Liver  transplant, ESRD, CAD, feeding tube    Examination-Activity Limitations Carry;Lift;Reach Overhead;Squat;Stairs;Stand;Transfers;Toileting    Examination-Participation Restrictions Cleaning;Community Activity;Driving;Medication Management;Meal Prep;Yard Work    [employer] officer Evolving/Moderate complexity    Rehab Potential Fair    PT Frequency 2x / week    PT Duration 12 weeks    PT Treatment/Interventions ADLs/Self Care Home Management;Cryotherapy;Moist Heat;DME Instruction;Gait training;Stair training;Functional mobility training;Therapeutic activities;Therapeutic exercise;Balance training;Neuromuscular re-education;Patient/family education;Wheelchair mobility training;Manual techniques;Passive range of motion;Dry needling;Energy conservation;Joint Manipulations    PT Next Visit Plan Progressive Therapeutic exercises, Balance training, transfer/gait training    PT Home Exercise Plan No changes or updates today.    Consulted and Agree with Plan of Care Patient              Lewis Moccasin PT  01/02/2022, 2:50 PM

## 2022-01-04 ENCOUNTER — Ambulatory Visit: Payer: Medicare Other

## 2022-01-04 DIAGNOSIS — R278 Other lack of coordination: Secondary | ICD-10-CM

## 2022-01-04 DIAGNOSIS — R296 Repeated falls: Secondary | ICD-10-CM

## 2022-01-04 DIAGNOSIS — R262 Difficulty in walking, not elsewhere classified: Secondary | ICD-10-CM | POA: Diagnosis not present

## 2022-01-04 DIAGNOSIS — R2681 Unsteadiness on feet: Secondary | ICD-10-CM

## 2022-01-04 DIAGNOSIS — R2689 Other abnormalities of gait and mobility: Secondary | ICD-10-CM

## 2022-01-04 DIAGNOSIS — M6281 Muscle weakness (generalized): Secondary | ICD-10-CM

## 2022-01-04 DIAGNOSIS — R269 Unspecified abnormalities of gait and mobility: Secondary | ICD-10-CM

## 2022-01-04 NOTE — Therapy (Signed)
OUTPATIENT PHYSICAL THERAPY TREATMENT NOTE  Patient Name: Alex Hampton MRN: [id number] DOB:1955/06/29, 66 y.o., male Today's Date: 01/05/2022  PCP: Dr. Juluis Pitch REFERRING PROVIDER: Eulis Canner, MD   PT End of Session - 01/04/22 1351     Visit Number 65    Number of Visits 27    Date for PT Re-Evaluation 01/04/22    Authorization Type Medicare/BCBS; PN 03/23/21    Authorization Time Period Initial Cert= 87/68/1157- 26/20/3559; Recert 7/41/6384-5/36/4680: Recert 06/29/2246- 05/16/35: Recert 0/07/8887- 1/69/4503    PT Start Time 8882    PT Stop Time 1419    PT Time Calculation (min) 31 min    Equipment Utilized During Treatment Gait belt    Activity Tolerance Patient limited by fatigue;Patient tolerated treatment well;No increased pain    Behavior During Therapy WFL for tasks assessed/performed                   Past Medical History:  Diagnosis Date   Depression    History of cardiac cath    History of heart artery stent    Hyperlipidemia    Hypertension    MI (myocardial infarction) ([place])    Renal disorder    Past Surgical History:  Procedure Laterality Date   IR [place] TUBE CHANGE  11/22/2020   IR [place] TUBE CHANGE  11/26/2020   IR [place] GASTRO/COLONIC TUBE PERCUT W/FLUORO  10/14/2020   LEFT HEART CATH AND CORONARY ANGIOGRAPHY N/A 08/19/2020   Procedure: LEFT HEART CATH AND CORONARY ANGIOGRAPHY;  Surgeon: Corey Skains, MD;  Location: [facility];  Service: Cardiovascular;  Laterality: N/A;   LIVER TRANSPLANT     Patient Active Problem List   Diagnosis Date Noted   Pulmonary embolism ([place]) 08/08/2021   CAP (community acquired pneumonia) 08/08/2021   Protein calorie malnutrition ([place]) 08/08/2021   Hypokalemia 08/08/2021   Pancreatic cyst    Verbal auditory hallucination    Early satiety    Feeding tube dysfunction    Generalized weakness 11/26/2020   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[place]) 11/26/2020   Recurrent falls 11/26/2020    Syncope 10/15/2020   Uremia 10/10/2020   ESRD on hemodialysis ([place]) 10/10/2020   Hyperkalemia 10/10/2020   Elevated troponin 10/10/2020   GERD (gastroesophageal reflux disease) 10/10/2020   CAD (coronary artery disease) 10/10/2020   G tube feedings ([place]) 10/10/2020   Diarrhea 10/10/2020   Diabetes mellitus ([place]) 10/10/2020   Weakness    ACS (acute coronary syndrome) ([place])    Liver transplant recipient [facility])    Anemia of chronic disease    Type 2 diabetes mellitus with diabetic neuropathy, with long-term current use of insulin ([place])    ESRD (end stage renal disease) ([place]) 08/19/2020   History of anemia due to chronic kidney disease 08/19/2020   Gait abnormality 10/17/2019   History of fall within past 90 days 10/17/2019   Visual hallucination 10/17/2019   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 10/17/2019   HTN (hypertension) 10/17/2019   History of MI (myocardial infarction) 10/17/2019   OSA (obstructive sleep apnea) 10/17/2019   Nondisplaced comminuted supracondylar fracture without intercondylar fracture of left humerus, subsequent encounter for fracture with nonunion 10/17/2019   Anxiety and depression 10/17/2019    REFERRING DIAG: Other malaise Z94.4 (ICD-10-CM) - Liver transplant status R26.9 (ICD-10-CM) - Unspecified abnormalities of gait and mobility   THERAPY DIAG:  Difficulty in walking, not elsewhere classified  Abnormality of gait and mobility  Muscle weakness (generalized)  Other abnormalities  of gait and mobility  Other lack of coordination  Unsteadiness on feet  Repeated falls  Rationale for Evaluation and Treatment Rehabilitation  PERTINENT HISTORY: Patient is a 66 year old male with recent referral for abnomality of gait and recent liver transplant in November 2021. He has past medical history significant for Liver transplant (02/18/2020), CKD, End stage renal disease- On hemodialyis T/TH/Sat., Anemia, DM with neuropathy, Non-stemi, Coronary artery  disease, Feeding tube with poor appetite, PE now on eliquis   PRECAUTIONS: none  SUBJECTIVE: Patient reports not feeling well at all today. Reports stomach is upset and feeling weak.   PAIN:  Are you having pain? No     TODAY'S TREATMENT:  01/05/2022   Therex:  Seated therex secondary to patient not feeling well.   Seated hip march 3# AW Alt LE 2 sets of 12 reps  Seated knee ext 3# AW alt LE x 12 reps Seated knee ext with hip add (ball squeeze) alt LE x 12 reps Hip circles 3# AW - around yoga block x 12 reps.  Calf raises (toes on 1/2 foam) x 12 reps  Toe raises (heels on 1/2 foam) x 12 reps Hip abd with GTB (around distal thighs) x 12 reps  *patient terminated session due to increasingly nauseated.   *Patient required brief rest break between each set  Education provided throughout session via VC/TC and demonstration to facilitate movement at target joints and correct muscle activation for all testing and exercises performed.        PATIENT EDUCATION: Education details: Exercise technique, progress towards remainder goals Person educated: Patient Education method: Explanation, Demonstration, Tactile cues, and Verbal cues Education comprehension: verbalized understanding, returned demonstration, verbal cues required, tactile cues required, and needs further education   HOME EXERCISE PROGRAM: No updates   PT Short Term Goals -      PT SHORT TERM GOAL #1   Title Pt will be independent with HEP in order to improve strength and balance in order to decrease fall risk and improve function at home and work.    Baseline 02/02/2021: Patient reports limited HEP in place currently from [facility] agency 12/14 compliance; 1/20/2023Patient states no questions regarding his current exercise regimen and states he is compliant.    Time 6    Period Weeks    Status Achieved    Target Date 03/16/21              PT Long Term Goals -       PT LONG TERM GOAL #1   Title Pt will  improve FOTO to target score of 50 to display perceived improvements in ability to complete ADL's.    Baseline 02/02/2021= 43 12/143: 51%; 04/29/2021= 53%; 09/09/2021= 53; 10/12/2021=54%   Time 12    Period Weeks    Status Achieved      PT LONG TERM GOAL #2   Title Pt will decrease 5TSTS by at least 8 seconds in order to demonstrate clinically significant improvement in LE strength.    Baseline 02/02/2021= 28.22 sec with BUE Support 12/14: 23.1 seconds; 04/29/2021=Unable to test today due to patient became nauseous with 6 min walk test and attempted TUG- unable to continue- will reassess next 1-2 visits; 06/08/2021= 25.1 sec with min BUE support; 07/20/2021= 17.75 with min BUE support    Time 12    Period Weeks    Status Achieved    Target Date 07/22/21      PT LONG TERM GOAL #3   Title  Pt will decrease TUG to below 19 seconds/decrease in order to demonstrate decreased fall risk.    Baseline 02/02/2021= 27.30 sec without AD 12/14: 23 seconds; 04/29/2021- Unable to assess as patient became very nauseated upon standing and request to stop treatment. 06/08/2021= 19.10 sec without an AD; 07/20/2021= 15.68 sec without UE Support    Time 12    Period Weeks    Status Achieved    Target Date 07/22/21      PT LONG TERM GOAL #4   Title Pt will increase 6MWT by at least 68m(166f in order to demonstrate clinically significant improvement in cardiopulmonary endurance and community ambulation    Baseline 02/02/2021= 83 feet in 1 min 10 sec wihtout an AD 12/14: 200 ft in  60m49mtes; 04/29/2021=Patient ambulated approx 60 feet yet had to stop due onset of nausea and unable to complete today; 07/15/2021= 330 feet in 2 min 25 sec without AD.  09/09/2021= 350 feet in 3 min 45 sec. 10/12/2021= 710 feet in 6 min- no AD (Short stand rest break)    Time 12    Period Weeks    Status Achieved   Target Date 07/22/21      PT LONG TERM GOAL #5   Title Pt will increase 10MWT by at least 0.2 m/s in order to demonstrate clinically  significant improvement in community ambulation.    Baseline 02/02/2021= 0.42 m/s 12/14: 0.49 m/s; 04/29/2021=Patient unable to test today secondary to being nauseated. 06/08/2021= 0.62 m/s; 07/20/2021= 0.87 m/s    Time 12    Period Weeks    Status Achieved    Target Date 07/22/21      PT LONG TERM GOAL #6   Title Pt will decrease 5TSTS by at least 3 seconds in order to demonstrate clinically significant improvement in LE strength.    Baseline 07/20/2021= 17.75 sec with Minimal BUE Support; 09/09/2021= 17.50 sec with very minimal UE Support. 10/12/2021= 14.90 sec with min BUE Support. 12/05/2021= 14.30 sec with BUE    Time 12    Period Weeks    Status Ongoing   Target Date 10/12/21    PT LONG TERM GOAL #7 Title:  Pt will increase 6MWT by at least 67m67m4ft67f order to demonstrate clinically significant improvement in cardiopulmonary endurance and community ambulation                                                       Baseline: 10/12/2021= 710 feet; 12/05/2021= 827 feet Goal status: Progressing             Plan -      Clinical Impression Statement Treatment limited to seated therex secondary to patient reporting not feeling well. He was limited in ability to perform resistive strengthening but performed his best and able to complete most with increased time allowed.  Pt will continue to benefit from skilled physical therapy intervention to address impairments, improve QOL, and attain therapy goals.     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    Rehab Potential Fair    PT Frequency 2x / week    PT Duration  12 weeks    PT  Treatment/Interventions ADLs/Self Care Home Management;Cryotherapy;Moist Heat;DME Instruction;Gait training;Stair training;Functional mobility training;Therapeutic activities;Therapeutic exercise;Balance training;Neuromuscular re-education;Patient/family education;Wheelchair mobility training;Manual techniques;Passive range of motion;Dry needling;Energy conservation;Joint Manipulations    PT Next Visit Plan Progressive Therapeutic exercises, Balance training, transfer/gait training    PT Home Exercise Plan No changes or updates today.    Consulted and Agree with Plan of Care Patient              Lewis Moccasin PT  01/05/2022, 3:52 PM

## 2022-01-09 ENCOUNTER — Ambulatory Visit: Payer: Medicare Other | Attending: Gastroenterology

## 2022-01-09 DIAGNOSIS — M6281 Muscle weakness (generalized): Secondary | ICD-10-CM | POA: Insufficient documentation

## 2022-01-09 DIAGNOSIS — R269 Unspecified abnormalities of gait and mobility: Secondary | ICD-10-CM | POA: Diagnosis present

## 2022-01-09 DIAGNOSIS — R2681 Unsteadiness on feet: Secondary | ICD-10-CM | POA: Diagnosis present

## 2022-01-09 DIAGNOSIS — R2689 Other abnormalities of gait and mobility: Secondary | ICD-10-CM | POA: Diagnosis present

## 2022-01-09 DIAGNOSIS — R296 Repeated falls: Secondary | ICD-10-CM | POA: Insufficient documentation

## 2022-01-09 DIAGNOSIS — R262 Difficulty in walking, not elsewhere classified: Secondary | ICD-10-CM | POA: Diagnosis present

## 2022-01-09 DIAGNOSIS — R278 Other lack of coordination: Secondary | ICD-10-CM | POA: Insufficient documentation

## 2022-01-09 NOTE — Therapy (Signed)
OUTPATIENT PHYSICAL THERAPY TREATMENT NOTE/RECERTIFICATION  Patient Name: Alex Hampton MRN: [id number] DOB:1955/12/11, 66 y.o., male Today's Date: 01/09/2022  PCP: Dr. Juluis Pitch REFERRING PROVIDER: Eulis Canner, MD   PT End of Session - 01/09/22 1344     Visit Number 16    Number of Visits 67    Date for PT Re-Evaluation 04/03/22    Authorization Type Medicare/BCBS; PN 03/23/21    Authorization Time Period Initial Cert= 78/29/5621- 30/86/5784; Recert 6/96/2952-8/41/3244: Recert 0/01/2724- 06/14/6438: Recert 06/12/7423- 9/56/3875; Recert 64/06/3293- 18/84/1660    Progress Note Due on Visit 70    PT Start Time 1345    PT Stop Time 1416    PT Time Calculation (min) 31 min    Equipment Utilized During Treatment Gait belt    Activity Tolerance Patient limited by fatigue;Patient tolerated treatment well;No increased pain    Behavior During Therapy WFL for tasks assessed/performed                    Past Medical History:  Diagnosis Date   Depression    History of cardiac cath    History of heart artery stent    Hyperlipidemia    Hypertension    MI (myocardial infarction) ([place])    Renal disorder    Past Surgical History:  Procedure Laterality Date   IR GJ TUBE CHANGE  11/22/2020   IR [place] TUBE CHANGE  11/26/2020   IR [place] GASTRO/COLONIC TUBE PERCUT W/FLUORO  10/14/2020   LEFT HEART CATH AND CORONARY ANGIOGRAPHY N/A 08/19/2020   Procedure: LEFT HEART CATH AND CORONARY ANGIOGRAPHY;  Surgeon: Corey Skains, MD;  Location: [place] [facility];  Service: Cardiovascular;  Laterality: N/A;   LIVER TRANSPLANT     Patient Active Problem List   Diagnosis Date Noted   Pulmonary embolism ([place]) 08/08/2021   CAP (community acquired pneumonia) 08/08/2021   Protein calorie malnutrition ([place]) 08/08/2021   Hypokalemia 08/08/2021   Pancreatic cyst    Verbal auditory hallucination    Early satiety    Feeding tube dysfunction    Generalized weakness 11/26/2020    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[place]) 11/26/2020   Recurrent falls 11/26/2020   Syncope 10/15/2020   Uremia 10/10/2020   ESRD on hemodialysis ([place]) 10/10/2020   Hyperkalemia 10/10/2020   Elevated troponin 10/10/2020   GERD (gastroesophageal reflux disease) 10/10/2020   CAD (coronary artery disease) 10/10/2020   G tube feedings ([place]) 10/10/2020   Diarrhea 10/10/2020   Diabetes mellitus ([place]) 10/10/2020   Weakness    ACS (acute coronary syndrome) ([place])    Liver transplant recipient [facility])    Anemia of chronic disease    Type 2 diabetes mellitus with diabetic neuropathy, with long-term current use of insulin ([place])    ESRD (end stage renal disease) ([place]) 08/19/2020   History of anemia due to chronic kidney disease 08/19/2020   Gait abnormality 10/17/2019   History of fall within past 90 days 10/17/2019   Visual hallucination 10/17/2019   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 10/17/2019   HTN (hypertension) 10/17/2019   History of MI (myocardial infarction) 10/17/2019   OSA (obstructive sleep apnea) 10/17/2019   Nondisplaced comminuted supracondylar fracture without intercondylar fracture of left humerus, subsequent encounter for fracture with nonunion 10/17/2019   Anxiety and depression 10/17/2019    REFERRING DIAG: Other malaise Z94.4 (ICD-10-CM) - Liver transplant status R26.9 (ICD-10-CM) - Unspecified abnormalities of gait and mobility   THERAPY DIAG:  Difficulty in walking, not elsewhere classified -  Plan: PT plan of care cert/re-cert  Abnormality of gait and mobility - Plan: PT plan of care cert/re-cert  Muscle weakness (generalized) - Plan: PT plan of care cert/re-cert  Other abnormalities of gait and mobility - Plan: PT plan of care cert/re-cert  Other lack of coordination - Plan: PT plan of care cert/re-cert  Unsteadiness on feet - Plan: PT plan of care cert/re-cert  Repeated falls - Plan: PT plan of care cert/re-cert  Rationale for Evaluation and Treatment  Rehabilitation  PERTINENT HISTORY: Patient is a 66 year old male with recent referral for abnomality of gait and recent liver transplant in November 2021. He has past medical history significant for Liver transplant (02/18/2020), CKD, End stage renal disease- On hemodialyis T/TH/Sat., Anemia, DM with neuropathy, Non-stemi, Coronary artery disease, Feeding tube with poor appetite, PE now on eliquis   PRECAUTIONS: none  SUBJECTIVE: Patient reports not feeling well at all today. Reports stomach is upset and feeling weak.   PAIN:  Are you having pain? No     TODAY'S TREATMENT:  10/2/023  Reassessed goals for recertification visit. See goal section for specific details.   Therex:  Seated therex secondary to patient not feeling well.   Seated hip march 3# AW Alt LE 2 sets of 12 reps  Seated knee ext 3# AW alt LE x 12 reps Seated knee ext with hip add (ball squeeze) alt LE x 12 reps Hip circles 3# AW - around yoga block x 12 reps.  Calf raises (toes on 1/2 foam) x 12 reps  Toe raises (heels on 1/2 foam) x 12 reps Hip abd with GTB (around distal thighs) x 12 reps  *patient terminated session due to increasingly nauseated.   *Patient required brief rest break between each set  Education provided throughout session via VC/TC and demonstration to facilitate movement at target joints and correct muscle activation for all testing and exercises performed.        PATIENT EDUCATION: Education details: Exercise technique, progress towards remainder goals Person educated: Patient Education method: Explanation, Demonstration, Tactile cues, and Verbal cues Education comprehension: verbalized understanding, returned demonstration, verbal cues required, tactile cues required, and needs further education   HOME EXERCISE PROGRAM: No updates   PT Short Term Goals -      PT SHORT TERM GOAL #1   Title Pt will be independent with HEP in order to improve strength and balance in order to  decrease fall risk and improve function at home and work.    Baseline 02/02/2021: Patient reports limited HEP in place currently from [facility] agency 12/14 compliance; 1/20/2023Patient states no questions regarding his current exercise regimen and states he is compliant.    Time 6    Period Weeks    Status Achieved    Target Date 03/16/21              PT Long Term Goals -       PT LONG TERM GOAL #1   Title Pt will improve FOTO to target score of 50 to display perceived improvements in ability to complete ADL's.    Baseline 02/02/2021= 43 12/143: 51%; 04/29/2021= 53%; 09/09/2021= 53; 10/12/2021=54%   Time 12    Period Weeks    Status Achieved      PT LONG TERM GOAL #2   Title Pt will decrease 5TSTS by at least 8 seconds in order to demonstrate clinically significant improvement in LE strength.    Baseline 02/02/2021= 28.22 sec with BUE Support 12/14: 23.1  seconds; 04/29/2021=Unable to test today due to patient became nauseous with 6 min walk test and attempted TUG- unable to continue- will reassess next 1-2 visits; 06/08/2021= 25.1 sec with min BUE support; 07/20/2021= 17.75 with min BUE support    Time 12    Period Weeks    Status Achieved    Target Date 07/22/21      PT LONG TERM GOAL #3   Title Pt will decrease TUG to below 19 seconds/decrease in order to demonstrate decreased fall risk.    Baseline 02/02/2021= 27.30 sec without AD 12/14: 23 seconds; 04/29/2021- Unable to assess as patient became very nauseated upon standing and request to stop treatment. 06/08/2021= 19.10 sec without an AD; 07/20/2021= 15.68 sec without UE Support    Time 12    Period Weeks    Status Achieved    Target Date 07/22/21      PT LONG TERM GOAL #4   Title Pt will increase 6MWT by at least 46m(1667f in order to demonstrate clinically significant improvement in cardiopulmonary endurance and community ambulation    Baseline 02/02/2021= 83 feet in 1 min 10 sec wihtout an AD 12/14: 200 ft in  31m25mtes;  04/29/2021=Patient ambulated approx 60 feet yet had to stop due onset of nausea and unable to complete today; 07/15/2021= 330 feet in 2 min 25 sec without AD.  09/09/2021= 350 feet in 3 min 45 sec. 10/12/2021= 710 feet in 6 min- no AD (Short stand rest break)    Time 12    Period Weeks    Status Achieved   Target Date 07/22/21      PT LONG TERM GOAL #5   Title Pt will increase 10MWT by at least 0.2 m/s in order to demonstrate clinically significant improvement in community ambulation.    Baseline 02/02/2021= 0.42 m/s 12/14: 0.49 m/s; 04/29/2021=Patient unable to test today secondary to being nauseated. 06/08/2021= 0.62 m/s; 07/20/2021= 0.87 m/s    Time 12    Period Weeks    Status Achieved    Target Date 07/22/21      PT LONG TERM GOAL #6   Title Pt will decrease 5TSTS by at least 3 seconds in order to demonstrate clinically significant improvement in LE strength.    Baseline 07/20/2021= 17.75 sec with Minimal BUE Support; 09/09/2021= 17.50 sec with very minimal UE Support. 10/12/2021= 14.90 sec with min BUE Support. 12/05/2021= 14.30 sec with BUE; 01/09/2022= 23.11 sec with min UE on thighs vs. Arm rests for more emphasis on BLE.     Time 12    Period Weeks    Status Ongoing   Target Date 04/03/2022   PT LONG TERM GOAL #7 Title:  Pt will increase 6MWT by at least [id number] order to demonstrate clinically significant improvement in cardiopulmonary endurance and community ambulation                                                       Baseline: 10/12/2021= 710 feet; 12/05/2021= 827 feet; 01/09/2022= 620 feet without an AD - stopping at 4 min mark due to self reported fatigue.  Goal status: ONGOING Target date: 04/03/2022        PT LONG TERM GOAL #8  Title Patient will demonstrate an improved Berg Balance Score of > 50/56 as to demonstrate improved balance with  ADLs such as sitting/standing and transfer balance and reduced fall risk.  Baseline 01/09/2022= 47/56  Time 12   Period Weeks   Status  INITIAL  Target Date 04/03/2022        Plan -      Clinical Impression Statement Patient presented with good motivation for recert visit today. He was able to demonstrate improvement in LE strengthening as seen by less UE support with 5 sec Sit to stand test and later added a new balance goal as patient is now able to stand long enough to complete test and walking more at home with reported balance difficulty. He scored 47/56 indicating some difficulty with balance and increased risk of falling. Patient had decline in 6 min walk test but was limited after completing other testing first. Will benefit from starting with 6 min walk test next 1-2 visits to test with patient less fatigued. Patient's condition has the potential to improve in response to therapy. Maximum improvement is yet to be obtained. The anticipated improvement is attainable and reasonable in a generally predictable time.  Pt will continue to benefit from skilled physical therapy intervention to address impairments, improve QOL, and attain therapy goals.     Personal Factors and Comorbidities Comorbidity 3+    Comorbidities DM, Liver transplant, ESRD, CAD, feeding tube    Examination-Activity Limitations Carry;Lift;Reach Overhead;Squat;Stairs;Stand;Transfers;Toileting    Examination-Participation Restrictions Cleaning;Community Activity;Driving;Medication Management;Meal Prep;Yard Work    [employer] Evolving/Moderate complexity    Rehab Potential Fair    PT Frequency 2x / week    PT Duration 12 weeks    PT Treatment/Interventions ADLs/Self Care Home Management;Cryotherapy;Moist Heat;DME Instruction;Gait training;Stair training;Functional mobility training;Therapeutic activities;Therapeutic exercise;Balance training;Neuromuscular re-education;Patient/family education;Wheelchair mobility training;Manual techniques;Passive range of motion;Dry needling;Energy conservation;Joint Manipulations    PT Next Visit Plan  Progressive Therapeutic exercises, Balance training, transfer/gait training    PT Home Exercise Plan No changes or updates today.    Consulted and Agree with Plan of Care Patient              Lewis Moccasin PT  01/09/2022, 2:25 PM

## 2022-01-11 ENCOUNTER — Ambulatory Visit: Payer: Medicare Other

## 2022-01-11 DIAGNOSIS — R2689 Other abnormalities of gait and mobility: Secondary | ICD-10-CM

## 2022-01-11 DIAGNOSIS — R262 Difficulty in walking, not elsewhere classified: Secondary | ICD-10-CM

## 2022-01-11 DIAGNOSIS — M6281 Muscle weakness (generalized): Secondary | ICD-10-CM

## 2022-01-11 DIAGNOSIS — R278 Other lack of coordination: Secondary | ICD-10-CM

## 2022-01-11 DIAGNOSIS — R2681 Unsteadiness on feet: Secondary | ICD-10-CM

## 2022-01-11 DIAGNOSIS — R269 Unspecified abnormalities of gait and mobility: Secondary | ICD-10-CM

## 2022-01-11 DIAGNOSIS — R296 Repeated falls: Secondary | ICD-10-CM

## 2022-01-11 NOTE — Therapy (Signed)
OUTPATIENT PHYSICAL THERAPY TREATMENT NOTE   Patient Name: Alex Hampton MRN: [id number] DOB:Feb 18, 1956, 66 y.o., male Today's Date: 01/12/2022  PCP: Dr. Juluis Pitch REFERRING PROVIDER: Eulis Canner, MD   PT End of Session - 01/11/22 1708     Visit Number 57    Number of Visits 79    Date for PT Re-Evaluation 04/03/22    Authorization Type Medicare/BCBS; PN 03/23/21    Authorization Time Period Initial Cert= 40/98/1191- 47/82/9562; Recert 05/10/8655-8/46/9629: Recert 09/05/4130- 07/12/100: Recert 10/09/5364- 4/40/3474; Recert 25/12/5636- 75/64/3329    Progress Note Due on Visit 70    PT Start Time 1346    PT Stop Time 1425    PT Time Calculation (min) 39 min    Equipment Utilized During Treatment Gait belt    Activity Tolerance Patient limited by fatigue;Patient tolerated treatment well;No increased pain    Behavior During Therapy WFL for tasks assessed/performed                     Past Medical History:  Diagnosis Date   Depression    History of cardiac cath    History of heart artery stent    Hyperlipidemia    Hypertension    MI (myocardial infarction) ([place])    Renal disorder    Past Surgical History:  Procedure Laterality Date   IR GJ TUBE CHANGE  11/22/2020   IR [place] TUBE CHANGE  11/26/2020   IR [place] GASTRO/COLONIC TUBE PERCUT W/FLUORO  10/14/2020   LEFT HEART CATH AND CORONARY ANGIOGRAPHY N/A 08/19/2020   Procedure: LEFT HEART CATH AND CORONARY ANGIOGRAPHY;  Surgeon: Corey Skains, MD;  Location: [place] [facility];  Service: Cardiovascular;  Laterality: N/A;   LIVER TRANSPLANT     Patient Active Problem List   Diagnosis Date Noted   Pulmonary embolism ([place]) 08/08/2021   CAP (community acquired pneumonia) 08/08/2021   Protein calorie malnutrition ([place]) 08/08/2021   Hypokalemia 08/08/2021   Pancreatic cyst    Verbal auditory hallucination    Early satiety    Feeding tube dysfunction    Generalized weakness 11/26/2020    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[place]) 11/26/2020   Recurrent falls 11/26/2020   Syncope 10/15/2020   Uremia 10/10/2020   ESRD on hemodialysis ([place]) 10/10/2020   Hyperkalemia 10/10/2020   Elevated troponin 10/10/2020   GERD (gastroesophageal reflux disease) 10/10/2020   CAD (coronary artery disease) 10/10/2020   G tube feedings ([place]) 10/10/2020   Diarrhea 10/10/2020   Diabetes mellitus ([place]) 10/10/2020   Weakness    ACS (acute coronary syndrome) ([place])    Liver transplant recipient [facility])    Anemia of chronic disease    Type 2 diabetes mellitus with diabetic neuropathy, with long-term current use of insulin ([place])    ESRD (end stage renal disease) ([place]) 08/19/2020   History of anemia due to chronic kidney disease 08/19/2020   Gait abnormality 10/17/2019   History of fall within past 90 days 10/17/2019   Visual hallucination 10/17/2019   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 10/17/2019   HTN (hypertension) 10/17/2019   History of MI (myocardial infarction) 10/17/2019   OSA (obstructive sleep apnea) 10/17/2019   Nondisplaced comminuted supracondylar fracture without intercondylar fracture of left humerus, subsequent encounter for fracture with nonunion 10/17/2019   Anxiety and depression 10/17/2019    REFERRING DIAG: Other malaise Z94.4 (ICD-10-CM) - Liver transplant status R26.9 (ICD-10-CM) - Unspecified abnormalities of gait and mobility   THERAPY DIAG:  Difficulty in walking, not  elsewhere classified  Abnormality of gait and mobility  Muscle weakness (generalized)  Other abnormalities of gait and mobility  Other lack of coordination  Unsteadiness on feet  Repeated falls  Rationale for Evaluation and Treatment Rehabilitation  PERTINENT HISTORY: Patient is a 66 year old male with recent referral for abnomality of gait and recent liver transplant in November 2021. He has past medical history significant for Liver transplant (02/18/2020), CKD, End stage renal disease- On  hemodialyis T/TH/Sat., Anemia, DM with neuropathy, Non-stemi, Coronary artery disease, Feeding tube with poor appetite, PE now on eliquis   PRECAUTIONS: none  SUBJECTIVE: Patient reports stomach is upset again today. Reports he will do what he can as far as exercises.   PAIN:  Are you having pain? No     TODAY'S TREATMENT:  10/4/023  Therex:  Standing  (step out + side lunge squat)  x 10 reps alt LE (Patient with minimal ability to straighten leg or single leg squat) using 4# AW Standing donkey kick x 10 reps Alt LE using 4# AW Standing Mini squat x 12 reps BLE Standing forward step up/over 1/2 foam x 10 reps with 4# AW Seated hip march 3# AW Alt LE 2 sets of 12 reps with 4# AW Seated knee ext 3# AW alt LE x 12 reps (VC to extend knee as full as possible)  Seated knee ext with hip add (ball squeeze) alt LE x 12 reps Ambulation with 4# AW x 150 feet with Supervision- O2 sat=97% and HR= 72 bpm Standing Calf raises  x 12 reps  Standing Toe raises x 12 reps Hip abd with GTB (around distal thighs) x 12 reps  Ambulation around 90 feet with 4# AW and supervision  *Patient required brief rest break between each set  Education provided throughout session via VC/TC and demonstration to facilitate movement at target joints and correct muscle activation for all testing and exercises performed.        PATIENT EDUCATION: Education details: Exercise technique, progress towards remainder goals Person educated: Patient Education method: Explanation, Demonstration, Tactile cues, and Verbal cues Education comprehension: verbalized understanding, returned demonstration, verbal cues required, tactile cues required, and needs further education   HOME EXERCISE PROGRAM: No updates   PT Short Term Goals -      PT SHORT TERM GOAL #1   Title Pt will be independent with HEP in order to improve strength and balance in order to decrease fall risk and improve function at home and work.     Baseline 02/02/2021: Patient reports limited HEP in place currently from [facility] agency 12/14 compliance; 1/20/2023Patient states no questions regarding his current exercise regimen and states he is compliant.    Time 6    Period Weeks    Status Achieved    Target Date 03/16/21              PT Long Term Goals -       PT LONG TERM GOAL #1   Title Pt will improve FOTO to target score of 50 to display perceived improvements in ability to complete ADL's.    Baseline 02/02/2021= 43 12/143: 51%; 04/29/2021= 53%; 09/09/2021= 53; 10/12/2021=54%   Time 12    Period Weeks    Status Achieved      PT LONG TERM GOAL #2   Title Pt will decrease 5TSTS by at least 8 seconds in order to demonstrate clinically significant improvement in LE strength.    Baseline 02/02/2021= 28.22 sec with BUE Support  12/14: 23.1 seconds; 04/29/2021=Unable to test today due to patient became nauseous with 6 min walk test and attempted TUG- unable to continue- will reassess next 1-2 visits; 06/08/2021= 25.1 sec with min BUE support; 07/20/2021= 17.75 with min BUE support    Time 12    Period Weeks    Status Achieved    Target Date 07/22/21      PT LONG TERM GOAL #3   Title Pt will decrease TUG to below 19 seconds/decrease in order to demonstrate decreased fall risk.    Baseline 02/02/2021= 27.30 sec without AD 12/14: 23 seconds; 04/29/2021- Unable to assess as patient became very nauseated upon standing and request to stop treatment. 06/08/2021= 19.10 sec without an AD; 07/20/2021= 15.68 sec without UE Support    Time 12    Period Weeks    Status Achieved    Target Date 07/22/21      PT LONG TERM GOAL #4   Title Pt will increase 6MWT by at least 19m(1662f in order to demonstrate clinically significant improvement in cardiopulmonary endurance and community ambulation    Baseline 02/02/2021= 83 feet in 1 min 10 sec wihtout an AD 12/14: 200 ft in  35m88mtes; 04/29/2021=Patient ambulated approx 60 feet yet had to stop due onset of  nausea and unable to complete today; 07/15/2021= 330 feet in 2 min 25 sec without AD.  09/09/2021= 350 feet in 3 min 45 sec. 10/12/2021= 710 feet in 6 min- no AD (Short stand rest break)    Time 12    Period Weeks    Status Achieved   Target Date 07/22/21      PT LONG TERM GOAL #5   Title Pt will increase 10MWT by at least 0.2 m/s in order to demonstrate clinically significant improvement in community ambulation.    Baseline 02/02/2021= 0.42 m/s 12/14: 0.49 m/s; 04/29/2021=Patient unable to test today secondary to being nauseated. 06/08/2021= 0.62 m/s; 07/20/2021= 0.87 m/s    Time 12    Period Weeks    Status Achieved    Target Date 07/22/21      PT LONG TERM GOAL #6   Title Pt will decrease 5TSTS by at least 3 seconds in order to demonstrate clinically significant improvement in LE strength.    Baseline 07/20/2021= 17.75 sec with Minimal BUE Support; 09/09/2021= 17.50 sec with very minimal UE Support. 10/12/2021= 14.90 sec with min BUE Support. 12/05/2021= 14.30 sec with BUE; 01/09/2022= 23.11 sec with min UE on thighs vs. Arm rests for more emphasis on BLE.     Time 12    Period Weeks    Status Ongoing   Target Date 04/03/2022   PT LONG TERM GOAL #7 Title:  Pt will increase 6MWT by at least 79m35m4ft51f order to demonstrate clinically significant improvement in cardiopulmonary endurance and community ambulation                                                       Baseline: 10/12/2021= 710 feet; 12/05/2021= 827 feet; 01/09/2022= 620 feet without an AD - stopping at 4 min mark due to self reported fatigue.  Goal status: ONGOING Target date: 04/03/2022        PT LONG TERM GOAL #8  Title Patient will demonstrate an improved Berg Balance Score of > 50/56 as to demonstrate improved  balance with ADLs such as sitting/standing and transfer balance and reduced fall risk.  Baseline 01/09/2022= 47/56  Time 12   Period Weeks   Status INITIAL  Target Date 04/03/2022        Plan -      Clinical Impression  Statement Patient performed well despite not feeling well today. He was able to complete 10-12 reps of standing/sitting exercises as well as perform some resistive gait with ankle weights today. He required more brief rest breaks overall.  Pt will continue to benefit from skilled physical therapy intervention to address impairments, improve QOL, and attain therapy goals.     Personal Factors and Comorbidities Comorbidity 3+    Comorbidities DM, Liver transplant, ESRD, CAD, feeding tube    Examination-Activity Limitations Carry;Lift;Reach Overhead;Squat;Stairs;Stand;Transfers;Toileting    Examination-Participation Restrictions Cleaning;Community Activity;Driving;Medication Management;Meal Prep;Yard Work    [employer] Evolving/Moderate complexity    Rehab Potential Fair    PT Frequency 2x / week    PT Duration 12 weeks    PT Treatment/Interventions ADLs/Self Care Home Management;Cryotherapy;Moist Heat;DME Instruction;Gait training;Stair training;Functional mobility training;Therapeutic activities;Therapeutic exercise;Balance training;Neuromuscular re-education;Patient/family education;Wheelchair mobility training;Manual techniques;Passive range of motion;Dry needling;Energy conservation;Joint Manipulations    PT Next Visit Plan Progressive Therapeutic exercises, Balance training, transfer/gait training    PT Home Exercise Plan No changes or updates today.    Consulted and Agree with Plan of Care Patient              Lewis Moccasin PT  01/12/2022, 8:17 AM

## 2022-01-16 ENCOUNTER — Ambulatory Visit: Payer: Medicare Other

## 2022-01-16 DIAGNOSIS — R278 Other lack of coordination: Secondary | ICD-10-CM

## 2022-01-16 DIAGNOSIS — R262 Difficulty in walking, not elsewhere classified: Secondary | ICD-10-CM | POA: Diagnosis not present

## 2022-01-16 DIAGNOSIS — R2689 Other abnormalities of gait and mobility: Secondary | ICD-10-CM

## 2022-01-16 DIAGNOSIS — M6281 Muscle weakness (generalized): Secondary | ICD-10-CM

## 2022-01-16 DIAGNOSIS — R296 Repeated falls: Secondary | ICD-10-CM

## 2022-01-16 DIAGNOSIS — R269 Unspecified abnormalities of gait and mobility: Secondary | ICD-10-CM

## 2022-01-16 DIAGNOSIS — R2681 Unsteadiness on feet: Secondary | ICD-10-CM

## 2022-01-16 NOTE — Therapy (Signed)
OUTPATIENT PHYSICAL THERAPY TREATMENT NOTE   Patient Name: Alex Hampton MRN: [id number] DOB:[birth year], 66 y.o., male Today's Date: 01/16/2022  PCP: Dr. Juluis Pitch REFERRING PROVIDER: Eulis Canner, MD   PT End of Session - 01/16/22 1353     Visit Number 80    Number of Visits 90    Date for PT Re-Evaluation 04/03/22    Authorization Type Medicare/BCBS; PN 03/23/21    Authorization Time Period Initial Cert= 52/77/8242- 35/36/1443; Recert 1/54/0086-7/61/9509: Recert 07/04/7122- 08/15/996: Recert 06/10/8248- 5/39/7673; Recert 41/12/3788- 24/12/7351    Progress Note Due on Visit 1    PT Start Time 1345    PT Stop Time 1424    PT Time Calculation (min) 39 min    Equipment Utilized During Treatment Gait belt    Activity Tolerance Patient limited by fatigue;Patient tolerated treatment well;No increased pain    Behavior During Therapy WFL for tasks assessed/performed                      Past Medical History:  Diagnosis Date   Depression    History of cardiac cath    History of heart artery stent    Hyperlipidemia    Hypertension    MI (myocardial infarction) ([place])    Renal disorder    Past Surgical History:  Procedure Laterality Date   IR [place] TUBE CHANGE  11/22/2020   IR [place] TUBE CHANGE  11/26/2020   IR [place] GASTRO/COLONIC TUBE PERCUT W/FLUORO  10/14/2020   LEFT HEART CATH AND CORONARY ANGIOGRAPHY N/A 08/19/2020   Procedure: LEFT HEART CATH AND CORONARY ANGIOGRAPHY;  Surgeon: Corey Skains, MD;  Location: [facility];  Service: Cardiovascular;  Laterality: N/A;   LIVER TRANSPLANT     Patient Active Problem List   Diagnosis Date Noted   Pulmonary embolism ([place]) 08/08/2021   CAP (community acquired pneumonia) 08/08/2021   Protein calorie malnutrition ([place]) 08/08/2021   Hypokalemia 08/08/2021   Pancreatic cyst    Verbal auditory hallucination    Early satiety    Feeding tube dysfunction    Generalized weakness 11/26/2020    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[place]) 11/26/2020   Recurrent falls 11/26/2020   Syncope 10/15/2020   Uremia 10/10/2020   ESRD on hemodialysis ([place]) 10/10/2020   Hyperkalemia 10/10/2020   Elevated troponin 10/10/2020   GERD (gastroesophageal reflux disease) 10/10/2020   CAD (coronary artery disease) 10/10/2020   G tube feedings ([place]) 10/10/2020   Diarrhea 10/10/2020   Diabetes mellitus ([place]) 10/10/2020   Weakness    ACS (acute coronary syndrome) ([place])    Liver transplant recipient [facility])    Anemia of chronic disease    Type 2 diabetes mellitus with diabetic neuropathy, with long-term current use of insulin ([place])    ESRD (end stage renal disease) ([place]) 08/19/2020   History of anemia due to chronic kidney disease 08/19/2020   Gait abnormality 10/17/2019   History of fall within past 90 days 10/17/2019   Visual hallucination 10/17/2019   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 10/17/2019   HTN (hypertension) 10/17/2019   History of MI (myocardial infarction) 10/17/2019   OSA (obstructive sleep apnea) 10/17/2019   Nondisplaced comminuted supracondylar fracture without intercondylar fracture of left humerus, subsequent encounter for fracture with nonunion 10/17/2019   Anxiety and depression 10/17/2019    REFERRING DIAG: Other malaise Z94.4 (ICD-10-CM) - Liver transplant status R26.9 (ICD-10-CM) - Unspecified abnormalities of gait and mobility   THERAPY DIAG:  Difficulty in walking,  not elsewhere classified  Abnormality of gait and mobility  Muscle weakness (generalized)  Other abnormalities of gait and mobility  Other lack of coordination  Unsteadiness on feet  Repeated falls  Rationale for Evaluation and Treatment Rehabilitation  PERTINENT HISTORY: Patient is a 66 year old male with recent referral for abnomality of gait and recent liver transplant in November 2021. He has past medical history significant for Liver transplant (02/18/2020), CKD, End stage renal disease- On  hemodialyis T/TH/Sat., Anemia, DM with neuropathy, Non-stemi, Coronary artery disease, Feeding tube with poor appetite, PE now on eliquis   PRECAUTIONS: none  SUBJECTIVE: Patient reports having a good day so far with no stomach issues currently.    PAIN:  Are you having pain? No     TODAY'S TREATMENT:  10/9/023  Therex:  Nustep (interval training): using BUE/LE at seat and arm level 11 L1 x 2 min L3 x 30 sec L1 x 1 min L3 x 30 sec L1 x 1 min Sit to stand x 5 reps (min UE on thighs- 15 sec) and another set of 5 reps at 16.27 sec  Ambulation with x 300 feet with Supervision  Neuro re-ed:  Dynamic step march on airex pad without UE support x 12 reps alt LE Static stand with feet narrowed on airex pad x 30 sec - Ankle righting reaction Tandem walk on firm surface in // bars without UE support Tandem walk along wood board - forward/backward x length of board x 3 Tandm walk along airex balance beam- forward/retro x 2 Side step along airex balance beam - down and back x 2  Standing  side stepping up/over 3 (1/2 foam rolls) in // bars - down and back x 3 each direction SLS - multiple attempts each LE ( typically around 2-3 sec- at best = 8 sec)     *Patient required brief rest break between each set  Education provided throughout session via VC/TC and demonstration to facilitate movement at target joints and correct muscle activation for all testing and exercises performed.        PATIENT EDUCATION: Education details: Exercise technique, progress towards remainder goals Person educated: Patient Education method: Explanation, Demonstration, Tactile cues, and Verbal cues Education comprehension: verbalized understanding, returned demonstration, verbal cues required, tactile cues required, and needs further education   HOME EXERCISE PROGRAM: No updates   PT Short Term Goals -      PT SHORT TERM GOAL #1   Title Pt will be independent with HEP in order to improve  strength and balance in order to decrease fall risk and improve function at home and work.    Baseline 02/02/2021: Patient reports limited HEP in place currently from [facility] agency 12/14 compliance; 1/20/2023Patient states no questions regarding his current exercise regimen and states he is compliant.    Time 6    Period Weeks    Status Achieved    Target Date 03/16/21              PT Long Term Goals -       PT LONG TERM GOAL #1   Title Pt will improve FOTO to target score of 50 to display perceived improvements in ability to complete ADL's.    Baseline 02/02/2021= 43 12/143: 51%; 04/29/2021= 53%; 09/09/2021= 53; 10/12/2021=54%   Time 12    Period Weeks    Status Achieved      PT LONG TERM GOAL #2   Title Pt will decrease 5TSTS by at least 8  seconds in order to demonstrate clinically significant improvement in LE strength.    Baseline 02/02/2021= 28.22 sec with BUE Support 12/14: 23.1 seconds; 04/29/2021=Unable to test today due to patient became nauseous with 6 min walk test and attempted TUG- unable to continue- will reassess next 1-2 visits; 06/08/2021= 25.1 sec with min BUE support; 07/20/2021= 17.75 with min BUE support    Time 12    Period Weeks    Status Achieved    Target Date 07/22/21      PT LONG TERM GOAL #3   Title Pt will decrease TUG to below 19 seconds/decrease in order to demonstrate decreased fall risk.    Baseline 02/02/2021= 27.30 sec without AD 12/14: 23 seconds; 04/29/2021- Unable to assess as patient became very nauseated upon standing and request to stop treatment. 06/08/2021= 19.10 sec without an AD; 07/20/2021= 15.68 sec without UE Support    Time 12    Period Weeks    Status Achieved    Target Date 07/22/21      PT LONG TERM GOAL #4   Title Pt will increase 6MWT by at least 831m(1641f in order to demonstrate clinically significant improvement in cardiopulmonary endurance and community ambulation    Baseline 02/02/2021= 83 feet in 1 min 10 sec wihtout an AD 12/14:  200 ft in  31m41mtes; 04/29/2021=Patient ambulated approx 60 feet yet had to stop due onset of nausea and unable to complete today; 07/15/2021= 330 feet in 2 min 25 sec without AD.  09/09/2021= 350 feet in 3 min 45 sec. 10/12/2021= 710 feet in 6 min- no AD (Short stand rest break)    Time 12    Period Weeks    Status Achieved   Target Date 07/22/21      PT LONG TERM GOAL #5   Title Pt will increase 10MWT by at least 0.2 m/s in order to demonstrate clinically significant improvement in community ambulation.    Baseline 02/02/2021= 0.42 m/s 12/14: 0.49 m/s; 04/29/2021=Patient unable to test today secondary to being nauseated. 06/08/2021= 0.62 m/s; 07/20/2021= 0.87 m/s    Time 12    Period Weeks    Status Achieved    Target Date 07/22/21      PT LONG TERM GOAL #6   Title Pt will decrease 5TSTS by at least 3 seconds in order to demonstrate clinically significant improvement in LE strength.    Baseline 07/20/2021= 17.75 sec with Minimal BUE Support; 09/09/2021= 17.50 sec with very minimal UE Support. 10/12/2021= 14.90 sec with min BUE Support. 12/05/2021= 14.30 sec with BUE; 01/09/2022= 23.11 sec with min UE on thighs vs. Arm rests for more emphasis on BLE.     Time 12    Period Weeks    Status Ongoing   Target Date 04/03/2022   PT LONG TERM GOAL #7 Title:  Pt will increase 6MWT by at least 29m81m4ft26f order to demonstrate clinically significant improvement in cardiopulmonary endurance and community ambulation                                                       Baseline: 10/12/2021= 710 feet; 12/05/2021= 827 feet; 01/09/2022= 620 feet without an AD - stopping at 4 min mark due to self reported fatigue.  Goal status: ONGOING Target date: 04/03/2022        PT  LONG TERM GOAL #8  Title Patient will demonstrate an improved Berg Balance Score of > 50/56 as to demonstrate improved balance with ADLs such as sitting/standing and transfer balance and reduced fall risk.  Baseline 01/09/2022= 47/56  Time 12   Period  Weeks   Status INITIAL  Target Date 04/03/2022        Plan -      Clinical Impression Statement Patient presented with good motivation for today's session. He performed well with all tandem activities- progressing from firm surface to airex beam without significant LOB. He presented with improved overall standing time and endurance with activities with very limited rest today. Pt will continue to benefit from skilled physical therapy intervention to address impairments, improve QOL, and attain therapy goals.     Personal Factors and Comorbidities Comorbidity 3+    Comorbidities DM, Liver transplant, ESRD, CAD, feeding tube    Examination-Activity Limitations Carry;Lift;Reach Overhead;Squat;Stairs;Stand;Transfers;Toileting    Examination-Participation Restrictions Cleaning;Community Activity;Driving;Medication Management;Meal Prep;Yard Work    [employer] Evolving/Moderate complexity    Rehab Potential Fair    PT Frequency 2x / week    PT Duration 12 weeks    PT Treatment/Interventions ADLs/Self Care Home Management;Cryotherapy;Moist Heat;DME Instruction;Gait training;Stair training;Functional mobility training;Therapeutic activities;Therapeutic exercise;Balance training;Neuromuscular re-education;Patient/family education;Wheelchair mobility training;Manual techniques;Passive range of motion;Dry needling;Energy conservation;Joint Manipulations    PT Next Visit Plan Progressive Therapeutic exercises, Balance training, transfer/gait training    PT Home Exercise Plan No changes or updates today.    Consulted and Agree with Plan of Care Patient              Lewis Moccasin PT  01/16/2022, 3:49 PM

## 2022-01-18 ENCOUNTER — Ambulatory Visit: Payer: Medicare Other

## 2022-01-23 ENCOUNTER — Ambulatory Visit: Payer: Medicare Other

## 2022-01-23 DIAGNOSIS — R2681 Unsteadiness on feet: Secondary | ICD-10-CM

## 2022-01-23 DIAGNOSIS — R2689 Other abnormalities of gait and mobility: Secondary | ICD-10-CM

## 2022-01-23 DIAGNOSIS — R278 Other lack of coordination: Secondary | ICD-10-CM

## 2022-01-23 DIAGNOSIS — R296 Repeated falls: Secondary | ICD-10-CM

## 2022-01-23 DIAGNOSIS — R262 Difficulty in walking, not elsewhere classified: Secondary | ICD-10-CM

## 2022-01-23 DIAGNOSIS — M6281 Muscle weakness (generalized): Secondary | ICD-10-CM

## 2022-01-23 DIAGNOSIS — R269 Unspecified abnormalities of gait and mobility: Secondary | ICD-10-CM

## 2022-01-23 NOTE — Therapy (Signed)
OUTPATIENT PHYSICAL THERAPY TREATMENT NOTE   Patient Name: Alex Hampton MRN: [id number] DOB:12-31-1955, 66 y.o., male Today's Date: 01/23/2022  PCP: Dr. Juluis Pitch REFERRING PROVIDER: Eulis Canner, MD   PT End of Session - 01/23/22 1354     Visit Number 76    Number of Visits 55    Date for PT Re-Evaluation 04/03/22    Authorization Type Medicare/BCBS; PN 03/23/21    Authorization Time Period Initial Cert= 99/37/1696- 78/93/8101; Recert 7/51/0258-09/04/7822: Recert 2/35/3614- 07/12/1538: Recert 0/11/6759- 9/50/9326; Recert 71/05/4578- 99/83/3825    Progress Note Due on Visit 70    PT Start Time 1345    PT Stop Time 1417    PT Time Calculation (min) 32 min    Equipment Utilized During Treatment Gait belt    Activity Tolerance Patient limited by fatigue;Patient tolerated treatment well;No increased pain    Behavior During Therapy WFL for tasks assessed/performed                       Past Medical History:  Diagnosis Date   Depression    History of cardiac cath    History of heart artery stent    Hyperlipidemia    Hypertension    MI (myocardial infarction) ([place])    Renal disorder    Past Surgical History:  Procedure Laterality Date   IR GJ TUBE CHANGE  11/22/2020   IR [place] TUBE CHANGE  11/26/2020   IR [place] GASTRO/COLONIC TUBE PERCUT W/FLUORO  10/14/2020   LEFT HEART CATH AND CORONARY ANGIOGRAPHY N/A 08/19/2020   Procedure: LEFT HEART CATH AND CORONARY ANGIOGRAPHY;  Surgeon: Corey Skains, MD;  Location: [facility];  Service: Cardiovascular;  Laterality: N/A;   LIVER TRANSPLANT     Patient Active Problem List   Diagnosis Date Noted   Pulmonary embolism ([place]) 08/08/2021   CAP (community acquired pneumonia) 08/08/2021   Protein calorie malnutrition ([place]) 08/08/2021   Hypokalemia 08/08/2021   Pancreatic cyst    Verbal auditory hallucination    Early satiety    Feeding tube dysfunction    Generalized weakness 11/26/2020    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[place]) 11/26/2020   Recurrent falls 11/26/2020   Syncope 10/15/2020   Uremia 10/10/2020   ESRD on hemodialysis ([place]) 10/10/2020   Hyperkalemia 10/10/2020   Elevated troponin 10/10/2020   GERD (gastroesophageal reflux disease) 10/10/2020   CAD (coronary artery disease) 10/10/2020   G tube feedings ([place]) 10/10/2020   Diarrhea 10/10/2020   Diabetes mellitus ([place]) 10/10/2020   Weakness    ACS (acute coronary syndrome) ([place])    Liver transplant recipient [facility])    Anemia of chronic disease    Type 2 diabetes mellitus with diabetic neuropathy, with long-term current use of insulin ([place])    ESRD (end stage renal disease) ([place]) 08/19/2020   History of anemia due to chronic kidney disease 08/19/2020   Gait abnormality 10/17/2019   History of fall within past 90 days 10/17/2019   Visual hallucination 10/17/2019   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 10/17/2019   HTN (hypertension) 10/17/2019   History of MI (myocardial infarction) 10/17/2019   OSA (obstructive sleep apnea) 10/17/2019   Nondisplaced comminuted supracondylar fracture without intercondylar fracture of left humerus, subsequent encounter for fracture with nonunion 10/17/2019   Anxiety and depression 10/17/2019    REFERRING DIAG: Other malaise Z94.4 (ICD-10-CM) - Liver transplant status R26.9 (ICD-10-CM) - Unspecified abnormalities of gait and mobility   THERAPY DIAG:  Difficulty in  walking, not elsewhere classified  Abnormality of gait and mobility  Muscle weakness (generalized)  Other abnormalities of gait and mobility  Other lack of coordination  Unsteadiness on feet  Repeated falls  Rationale for Evaluation and Treatment Rehabilitation  PERTINENT HISTORY: Patient is a 66 year old male with recent referral for abnomality of gait and recent liver transplant in November 2021. He has past medical history significant for Liver transplant (02/18/2020), CKD, End stage renal disease- On  hemodialyis T/TH/Sat., Anemia, DM with neuropathy, Non-stemi, Coronary artery disease, Feeding tube with poor appetite, PE now on eliquis   PRECAUTIONS: none  SUBJECTIVE:    PAIN:  Are you having pain? No     TODAY'S TREATMENT:  10/16/023  Therex:   Seated LE therex secondary to patient not feeling well.   Seated hip march 3# AW 2 sets of 12 reps  Seated LAQ 3# AW - 2 sets of 12 reps  (2nd set performed with red soft ball between knees with adduction squeeze)  Seated ham curl GTB 2 sets of 12 reps Seated toe raise- GTB - 2 sets of 12 reps each LE Seated heel raise 3# AW with toe box on 1/2 foam-  2sets of 12 reps each LE Seated hip abd/Adduction 3# AW up/over 1/2 foam- 2 sets of 12 reps each LE Ambulation in clinic 320 feet in 2 min - Reported fatigue and requested to sit   *Patient required brief rest break between each set  Education provided throughout session via VC/TC and demonstration to facilitate movement at target joints and correct muscle activation for all testing and exercises performed.        PATIENT EDUCATION: Education details: Exercise technique, progress towards remainder goals Person educated: Patient Education method: Explanation, Demonstration, Tactile cues, and Verbal cues Education comprehension: verbalized understanding, returned demonstration, verbal cues required, tactile cues required, and needs further education   HOME EXERCISE PROGRAM: No updates   PT Short Term Goals -      PT SHORT TERM GOAL #1   Title Pt will be independent with HEP in order to improve strength and balance in order to decrease fall risk and improve function at home and work.    Baseline 02/02/2021: Patient reports limited HEP in place currently from [facility] agency 12/14 compliance; 1/20/2023Patient states no questions regarding his current exercise regimen and states he is compliant.    Time 6    Period Weeks    Status Achieved    Target Date 03/16/21               PT Long Term Goals -       PT LONG TERM GOAL #1   Title Pt will improve FOTO to target score of 50 to display perceived improvements in ability to complete ADL's.    Baseline 02/02/2021= 43 12/143: 51%; 04/29/2021= 53%; 09/09/2021= 53; 10/12/2021=54%   Time 12    Period Weeks    Status Achieved      PT LONG TERM GOAL #2   Title Pt will decrease 5TSTS by at least 8 seconds in order to demonstrate clinically significant improvement in LE strength.    Baseline 02/02/2021= 28.22 sec with BUE Support 12/14: 23.1 seconds; 04/29/2021=Unable to test today due to patient became nauseous with 6 min walk test and attempted TUG- unable to continue- will reassess next 1-2 visits; 06/08/2021= 25.1 sec with min BUE support; 07/20/2021= 17.75 with min BUE support    Time 12    Period Weeks  Status Achieved    Target Date 07/22/21      PT LONG TERM GOAL #3   Title Pt will decrease TUG to below 19 seconds/decrease in order to demonstrate decreased fall risk.    Baseline 02/02/2021= 27.30 sec without AD 12/14: 23 seconds; 04/29/2021- Unable to assess as patient became very nauseated upon standing and request to stop treatment. 06/08/2021= 19.10 sec without an AD; 07/20/2021= 15.68 sec without UE Support    Time 12    Period Weeks    Status Achieved    Target Date 07/22/21      PT LONG TERM GOAL #4   Title Pt will increase 6MWT by at least 757m(1627f in order to demonstrate clinically significant improvement in cardiopulmonary endurance and community ambulation    Baseline 02/02/2021= 83 feet in 1 min 10 sec wihtout an AD 12/14: 200 ft in  57m58mtes; 04/29/2021=Patient ambulated approx 60 feet yet had to stop due onset of nausea and unable to complete today; 07/15/2021= 330 feet in 2 min 25 sec without AD.  09/09/2021= 350 feet in 3 min 45 sec. 10/12/2021= 710 feet in 6 min- no AD (Short stand rest break)    Time 12    Period Weeks    Status Achieved   Target Date 07/22/21      PT LONG TERM GOAL #5   Title Pt  will increase 10MWT by at least 0.2 m/s in order to demonstrate clinically significant improvement in community ambulation.    Baseline 02/02/2021= 0.42 m/s 12/14: 0.49 m/s; 04/29/2021=Patient unable to test today secondary to being nauseated. 06/08/2021= 0.62 m/s; 07/20/2021= 0.87 m/s    Time 12    Period Weeks    Status Achieved    Target Date 07/22/21      PT LONG TERM GOAL #6   Title Pt will decrease 5TSTS by at least 3 seconds in order to demonstrate clinically significant improvement in LE strength.    Baseline 07/20/2021= 17.75 sec with Minimal BUE Support; 09/09/2021= 17.50 sec with very minimal UE Support. 10/12/2021= 14.90 sec with min BUE Support. 12/05/2021= 14.30 sec with BUE; 01/09/2022= 23.11 sec with min UE on thighs vs. Arm rests for more emphasis on BLE.     Time 12    Period Weeks    Status Ongoing   Target Date 04/03/2022   PT LONG TERM GOAL #7 Title:  Pt will increase 6MWT by at least 57m70m4ft55f order to demonstrate clinically significant improvement in cardiopulmonary endurance and community ambulation                                                       Baseline: 10/12/2021= 710 feet; 12/05/2021= 827 feet; 01/09/2022= 620 feet without an AD - stopping at 4 min mark due to self reported fatigue.  Goal status: ONGOING Target date: 04/03/2022        PT LONG TERM GOAL #8  Title Patient will demonstrate an improved Berg Balance Score of > 50/56 as to demonstrate improved balance with ADLs such as sitting/standing and transfer balance and reduced fall risk.  Baseline 01/09/2022= 47/56  Time 12   Period Weeks   Status INITIAL  Target Date 04/03/2022        Plan -      Clinical Impression Statement Treatment limited overall today secondary  to patient not feeling well. He fatigued quickly with all seated therex and later with limited ambulation. He required only minimal VC for correct technique with therex with obvious sign of fatigue- breathing, difficulty raising LE's.  Pt  will continue to benefit from skilled physical therapy intervention to address impairments, improve QOL, and attain therapy goals.     Personal Factors and Comorbidities Comorbidity 3+    Comorbidities DM, Liver transplant, ESRD, CAD, feeding tube    Examination-Activity Limitations Carry;Lift;Reach Overhead;Squat;Stairs;Stand;Transfers;Toileting    Examination-Participation Restrictions Cleaning;Community Activity;Driving;Medication Management;Meal Prep;Yard Work    [employer] officer Evolving/Moderate complexity    Rehab Potential Fair    PT Frequency 2x / week    PT Duration 12 weeks    PT Treatment/Interventions ADLs/Self Care Home Management;Cryotherapy;Moist Heat;DME Instruction;Gait training;Stair training;Functional mobility training;Therapeutic activities;Therapeutic exercise;Balance training;Neuromuscular re-education;Patient/family education;Wheelchair mobility training;Manual techniques;Passive range of motion;Dry needling;Energy conservation;Joint Manipulations    PT Next Visit Plan Progressive Therapeutic exercises, Balance training, transfer/gait training    PT Home Exercise Plan No changes or updates today.    Consulted and Agree with Plan of Care Patient              Lewis Moccasin PT  01/23/2022, 2:23 PM

## 2022-01-25 ENCOUNTER — Ambulatory Visit: Payer: Medicare Other

## 2022-01-25 DIAGNOSIS — M6281 Muscle weakness (generalized): Secondary | ICD-10-CM

## 2022-01-25 DIAGNOSIS — R262 Difficulty in walking, not elsewhere classified: Secondary | ICD-10-CM

## 2022-01-25 DIAGNOSIS — R269 Unspecified abnormalities of gait and mobility: Secondary | ICD-10-CM

## 2022-01-25 DIAGNOSIS — R2689 Other abnormalities of gait and mobility: Secondary | ICD-10-CM

## 2022-01-25 DIAGNOSIS — R296 Repeated falls: Secondary | ICD-10-CM

## 2022-01-25 DIAGNOSIS — R278 Other lack of coordination: Secondary | ICD-10-CM

## 2022-01-25 DIAGNOSIS — R2681 Unsteadiness on feet: Secondary | ICD-10-CM

## 2022-01-25 NOTE — Therapy (Signed)
OUTPATIENT PHYSICAL THERAPY TREATMENT NOTE/Physical Therapy Progress Note   Dates of reporting period  12/05/2021   to   01/25/2022   Patient Name: Alex Hampton MRN: [id number] DOB:1955-09-22, 66 y.o., male Today's Date: 01/25/2022  PCP: Dr. Juluis Pitch REFERRING PROVIDER: Eulis Canner, MD   PT End of Session - 01/25/22 1352     Visit Number 70    Number of Visits 73    Date for PT Re-Evaluation 04/03/22    Authorization Type Medicare/BCBS; PN 03/23/21    Authorization Time Period Initial Cert= 18/84/1660- 63/04/6008; Recert 9/32/3557-06/29/252: Recert 2/70/6237- 09/09/8313: Recert 04/16/6158- 7/37/1062; Recert 69/07/8544- 27/06/5007    Progress Note Due on Visit 47    PT Start Time 1345    PT Stop Time 1418    PT Time Calculation (min) 33 min    Equipment Utilized During Treatment Gait belt    Activity Tolerance Patient limited by fatigue;Patient tolerated treatment well;No increased pain    Behavior During Therapy WFL for tasks assessed/performed                       Past Medical History:  Diagnosis Date   Depression    History of cardiac cath    History of heart artery stent    Hyperlipidemia    Hypertension    MI (myocardial infarction) ([place])    Renal disorder    Past Surgical History:  Procedure Laterality Date   IR [place] TUBE CHANGE  11/22/2020   IR [place] TUBE CHANGE  11/26/2020   IR [place] GASTRO/COLONIC TUBE PERCUT W/FLUORO  10/14/2020   LEFT HEART CATH AND CORONARY ANGIOGRAPHY N/A 08/19/2020   Procedure: LEFT HEART CATH AND CORONARY ANGIOGRAPHY;  Surgeon: Corey Skains, MD;  Location: [facility] [facility];  Service: Cardiovascular;  Laterality: N/A;   LIVER TRANSPLANT     Patient Active Problem List   Diagnosis Date Noted   Pulmonary embolism ([place]) 08/08/2021   CAP (community acquired pneumonia) 08/08/2021   Protein calorie malnutrition ([place]) 08/08/2021   Hypokalemia 08/08/2021   Pancreatic cyst    Verbal auditory hallucination     Early satiety    Feeding tube dysfunction    Generalized weakness 11/26/2020   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[place]) 11/26/2020   Recurrent falls 11/26/2020   Syncope 10/15/2020   Uremia 10/10/2020   ESRD on hemodialysis ([place]) 10/10/2020   Hyperkalemia 10/10/2020   Elevated troponin 10/10/2020   GERD (gastroesophageal reflux disease) 10/10/2020   CAD (coronary artery disease) 10/10/2020   G tube feedings ([place]) 10/10/2020   Diarrhea 10/10/2020   Diabetes mellitus ([place]) 10/10/2020   Weakness    ACS (acute coronary syndrome) ([place])    Liver transplant recipient [facility])    Anemia of chronic disease    Type 2 diabetes mellitus with diabetic neuropathy, with long-term current use of insulin ([place])    ESRD (end stage renal disease) ([place]) 08/19/2020   History of anemia due to chronic kidney disease 08/19/2020   Gait abnormality 10/17/2019   History of fall within past 90 days 10/17/2019   Visual hallucination 10/17/2019   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 10/17/2019   HTN (hypertension) 10/17/2019   History of MI (myocardial infarction) 10/17/2019   OSA (obstructive sleep apnea) 10/17/2019   Nondisplaced comminuted supracondylar fracture without intercondylar fracture of left humerus, subsequent encounter for fracture with nonunion 10/17/2019   Anxiety and depression 10/17/2019    REFERRING DIAG: Other malaise Z94.4 (ICD-10-CM) - Liver transplant  status R26.9 (ICD-10-CM) - Unspecified abnormalities of gait and mobility   THERAPY DIAG:  Difficulty in walking, not elsewhere classified  Abnormality of gait and mobility  Muscle weakness (generalized)  Other abnormalities of gait and mobility  Other lack of coordination  Unsteadiness on feet  Repeated falls  Rationale for Evaluation and Treatment Rehabilitation  PERTINENT HISTORY: Patient is a 67 year old male with recent referral for abnomality of gait and recent liver transplant in November 2021. He has past medical  history significant for Liver transplant (02/18/2020), CKD, End stage renal disease- On hemodialyis T/TH/Sat., Anemia, DM with neuropathy, Non-stemi, Coronary artery disease, Feeding tube with poor appetite, PE now on eliquis   PRECAUTIONS: none  SUBJECTIVE:    PAIN:  Are you having pain? No     TODAY'S TREATMENT:  10/18/023  Therex:  Standing hip march(high knees)  Hip abd with 3# and GTB Calf raises 3# Tandem standing SLS multiple attempts   Seated LE therex   Seated hip march 3# AW 2 sets of 12 reps  Seated LAQ 3# AW - 2 sets of 12 reps    Seated ham curl GTB 1 set  of 12 reps Seated toe raise- GTB - 2 sets of 12 reps each LE Seated hip abd/Adduction 3# AW up/over 1/2 foam- 2 sets of 12 reps each LE Ambulation in clinic > 300 feet including horizontal head turns and walking with 3# AW  *Patient required brief rest break between each set  Education provided throughout session via VC/TC and demonstration to facilitate movement at target joints and correct muscle activation for all testing and exercises performed.        PATIENT EDUCATION: Education details: Exercise technique, progress towards remainder goals Person educated: Patient Education method: Explanation, Demonstration, Tactile cues, and Verbal cues Education comprehension: verbalized understanding, returned demonstration, verbal cues required, tactile cues required, and needs further education   HOME EXERCISE PROGRAM: No updates   PT Short Term Goals -      PT SHORT TERM GOAL #1   Title Pt will be independent with HEP in order to improve strength and balance in order to decrease fall risk and improve function at home and work.    Baseline 02/02/2021: Patient reports limited HEP in place currently from [facility] agency 12/14 compliance; 1/20/2023Patient states no questions regarding his current exercise regimen and states he is compliant.    Time 6    Period Weeks    Status Achieved    Target Date  03/16/21              PT Long Term Goals -       PT LONG TERM GOAL #1   Title Pt will improve FOTO to target score of 50 to display perceived improvements in ability to complete ADL's.    Baseline 02/02/2021= 43 12/143: 51%; 04/29/2021= 53%; 09/09/2021= 53; 10/12/2021=54%   Time 12    Period Weeks    Status Achieved      PT LONG TERM GOAL #2   Title Pt will decrease 5TSTS by at least 8 seconds in order to demonstrate clinically significant improvement in LE strength.    Baseline 02/02/2021= 28.22 sec with BUE Support 12/14: 23.1 seconds; 04/29/2021=Unable to test today due to patient became nauseous with 6 min walk test and attempted TUG- unable to continue- will reassess next 1-2 visits; 06/08/2021= 25.1 sec with min BUE support; 07/20/2021= 17.75 with min BUE support    Time 12    Period  Weeks    Status Achieved    Target Date 07/22/21      PT LONG TERM GOAL #3   Title Pt will decrease TUG to below 19 seconds/decrease in order to demonstrate decreased fall risk.    Baseline 02/02/2021= 27.30 sec without AD 12/14: 23 seconds; 04/29/2021- Unable to assess as patient became very nauseated upon standing and request to stop treatment. 06/08/2021= 19.10 sec without an AD; 07/20/2021= 15.68 sec without UE Support    Time 12    Period Weeks    Status Achieved    Target Date 07/22/21      PT LONG TERM GOAL #4   Title Pt will increase 6MWT by at least 67m(165f in order to demonstrate clinically significant improvement in cardiopulmonary endurance and community ambulation    Baseline 02/02/2021= 83 feet in 1 min 10 sec wihtout an AD 12/14: 200 ft in  66m99mtes; 04/29/2021=Patient ambulated approx 60 feet yet had to stop due onset of nausea and unable to complete today; 07/15/2021= 330 feet in 2 min 25 sec without AD.  09/09/2021= 350 feet in 3 min 45 sec. 10/12/2021= 710 feet in 6 min- no AD (Short stand rest break)    Time 12    Period Weeks    Status Achieved   Target Date 07/22/21      PT LONG TERM  GOAL #5   Title Pt will increase 10MWT by at least 0.2 m/s in order to demonstrate clinically significant improvement in community ambulation.    Baseline 02/02/2021= 0.42 m/s 12/14: 0.49 m/s; 04/29/2021=Patient unable to test today secondary to being nauseated. 06/08/2021= 0.62 m/s; 07/20/2021= 0.87 m/s    Time 12    Period Weeks    Status Achieved    Target Date 07/22/21      PT LONG TERM GOAL #6   Title Pt will decrease 5TSTS by at least 3 seconds in order to demonstrate clinically significant improvement in LE strength.    Baseline 07/20/2021= 17.75 sec with Minimal BUE Support; 09/09/2021= 17.50 sec with very minimal UE Support. 10/12/2021= 14.90 sec with min BUE Support. 12/05/2021= 14.30 sec with BUE; 01/09/2022= 23.11 sec with min UE on thighs vs. Arm rests for more emphasis on BLE.     Time 12    Period Weeks    Status Ongoing   Target Date 04/03/2022   PT LONG TERM GOAL #7 Title:  Pt will increase 6MWT by at least 10m266m4ft65f order to demonstrate clinically significant improvement in cardiopulmonary endurance and community ambulation                                                       Baseline: 10/12/2021= 710 feet; 12/05/2021= 827 feet; 01/09/2022= 620 feet without an AD - stopping at 4 min mark due to self reported fatigue.  Goal status: ONGOING Target date: 04/03/2022        PT LONG TERM GOAL #8  Title Patient will demonstrate an improved Berg Balance Score of > 50/56 as to demonstrate improved balance with ADLs such as sitting/standing and transfer balance and reduced fall risk.  Baseline 01/09/2022= 47/56  Time 12   Period Weeks   Status INITIAL  Target Date 04/03/2022        Plan -      Clinical Impression Statement Patient  presents with good motivation but continues to not feel well today- Was supposed to reassess for progress visit report #70. However deferred testing secondary to patient was just reevaluated a few visits ago and the fact of him not feeling well. Although  he did not feel well he performed the most he could and presented with improving tandem and single leg standing. Patient's condition has the potential to improve in response to therapy. Maximum improvement is yet to be obtained. The anticipated improvement is attainable and reasonable in a generally predictable time.  Pt will continue to benefit from skilled physical therapy intervention to address impairments, improve QOL, and attain therapy goals.     Personal Factors and Comorbidities Comorbidity 3+    Comorbidities DM, Liver transplant, ESRD, CAD, feeding tube    Examination-Activity Limitations Carry;Lift;Reach Overhead;Squat;Stairs;Stand;Transfers;Toileting    Examination-Participation Restrictions Cleaning;Community Activity;Driving;Medication Management;Meal Prep;Yard Work    [employer] Evolving/Moderate complexity    Rehab Potential Fair    PT Frequency 2x / week    PT Duration 12 weeks    PT Treatment/Interventions ADLs/Self Care Home Management;Cryotherapy;Moist Heat;DME Instruction;Gait training;Stair training;Functional mobility training;Therapeutic activities;Therapeutic exercise;Balance training;Neuromuscular re-education;Patient/family education;Wheelchair mobility training;Manual techniques;Passive range of motion;Dry needling;Energy conservation;Joint Manipulations    PT Next Visit Plan Progressive Therapeutic exercises, Balance training, transfer/gait training    PT Home Exercise Plan No changes or updates today.    Consulted and Agree with Plan of Care Patient              Lewis Moccasin PT  01/25/2022, 2:32 PM

## 2022-01-30 ENCOUNTER — Ambulatory Visit: Payer: Medicare Other

## 2022-01-30 DIAGNOSIS — M6281 Muscle weakness (generalized): Secondary | ICD-10-CM

## 2022-01-30 DIAGNOSIS — R269 Unspecified abnormalities of gait and mobility: Secondary | ICD-10-CM

## 2022-01-30 DIAGNOSIS — R2689 Other abnormalities of gait and mobility: Secondary | ICD-10-CM

## 2022-01-30 DIAGNOSIS — R262 Difficulty in walking, not elsewhere classified: Secondary | ICD-10-CM | POA: Diagnosis not present

## 2022-01-30 DIAGNOSIS — R296 Repeated falls: Secondary | ICD-10-CM

## 2022-01-30 DIAGNOSIS — R2681 Unsteadiness on feet: Secondary | ICD-10-CM

## 2022-01-30 DIAGNOSIS — R278 Other lack of coordination: Secondary | ICD-10-CM

## 2022-01-30 NOTE — Therapy (Signed)
OUTPATIENT PHYSICAL THERAPY TREATMENT NOTE    Patient Name: Alex Hampton MRN: [id number] DOB:09-10-1955, 66 y.o., male Today's Date: 01/30/2022  PCP: Dr. Juluis Pitch REFERRING PROVIDER: Eulis Canner, MD   PT End of Session - 01/30/22 1429     Visit Number 70    Number of Visits 71    Date for PT Re-Evaluation 04/03/22    Authorization Type Medicare/BCBS; PN 03/23/21    Authorization Time Period Initial Cert= 74/82/7078- 67/54/4920; Recert 1/00/7121-9/75/8832: Recert 5/49/8264- 04/15/8307: Recert 4/0/7680- 8/81/1031; Recert 59/07/5857- 29/24/4628    Progress Note Due on Visit 80    PT Start Time 1347    PT Stop Time 1422    PT Time Calculation (min) 35 min    Equipment Utilized During Treatment Gait belt    Activity Tolerance Patient limited by fatigue;Patient tolerated treatment well;No increased pain    Behavior During Therapy WFL for tasks assessed/performed                        Past Medical History:  Diagnosis Date   Depression    History of cardiac cath    History of heart artery stent    Hyperlipidemia    Hypertension    MI (myocardial infarction) ([place])    Renal disorder    Past Surgical History:  Procedure Laterality Date   IR [place] TUBE CHANGE  11/22/2020   IR [place] TUBE CHANGE  11/26/2020   IR [place] GASTRO/COLONIC TUBE PERCUT W/FLUORO  10/14/2020   LEFT HEART CATH AND CORONARY ANGIOGRAPHY N/A 08/19/2020   Procedure: LEFT HEART CATH AND CORONARY ANGIOGRAPHY;  Surgeon: Corey Skains, MD;  Location: [place] [facility];  Service: Cardiovascular;  Laterality: N/A;   LIVER TRANSPLANT     Patient Active Problem List   Diagnosis Date Noted   Pulmonary embolism ([place]) 08/08/2021   CAP (community acquired pneumonia) 08/08/2021   Protein calorie malnutrition ([place]) 08/08/2021   Hypokalemia 08/08/2021   Pancreatic cyst    Verbal auditory hallucination    Early satiety    Feeding tube dysfunction    Generalized weakness 11/26/2020    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[place]) 11/26/2020   Recurrent falls 11/26/2020   Syncope 10/15/2020   Uremia 10/10/2020   ESRD on hemodialysis ([place]) 10/10/2020   Hyperkalemia 10/10/2020   Elevated troponin 10/10/2020   GERD (gastroesophageal reflux disease) 10/10/2020   CAD (coronary artery disease) 10/10/2020   G tube feedings ([place]) 10/10/2020   Diarrhea 10/10/2020   Diabetes mellitus ([place]) 10/10/2020   Weakness    ACS (acute coronary syndrome) ([place])    Liver transplant recipient [facility])    Anemia of chronic disease    Type 2 diabetes mellitus with diabetic neuropathy, with long-term current use of insulin ([place])    ESRD (end stage renal disease) ([place]) 08/19/2020   History of anemia due to chronic kidney disease 08/19/2020   Gait abnormality 10/17/2019   History of fall within past 90 days 10/17/2019   Visual hallucination 10/17/2019   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 CT 10/17/2019   HTN (hypertension) 10/17/2019   History of MI (myocardial infarction) 10/17/2019   OSA (obstructive sleep apnea) 10/17/2019   Nondisplaced comminuted supracondylar fracture without intercondylar fracture of left humerus, subsequent encounter for fracture with nonunion 10/17/2019   Anxiety and depression 10/17/2019    REFERRING DIAG: Other malaise Z94.4 (ICD-10-CM) - Liver transplant status R26.9 (ICD-10-CM) - Unspecified abnormalities of gait and mobility   THERAPY DIAG:  Difficulty in walking, not elsewhere classified  Abnormality of gait and mobility  Muscle weakness (generalized)  Other abnormalities of gait and mobility  Other lack of coordination  Unsteadiness on feet  Repeated falls  Rationale for Evaluation and Treatment Rehabilitation  PERTINENT HISTORY: Patient is a 66 year old male with recent referral for abnomality of gait and recent liver transplant in November 2021. He has past medical history significant for Liver transplant (02/18/2020), CKD, End stage renal disease- On  hemodialyis T/TH/Sat., Anemia, DM with neuropathy, Non-stemi, Coronary artery disease, Feeding tube with poor appetite, PE now on eliquis   PRECAUTIONS: none  SUBJECTIVE: Patient reports feeling pretty good today and denies any stomach issues currently. States later in session that he feels his overall endurance and stamina is improving.    PAIN:  Are you having pain? No     TODAY'S TREATMENT:  10/23/023  Therex:   Seated LAQ @ 3lb AW Seated B ham curl with BTB x 12 reps   Ambulation x 150-175 feet with 3lb AW's Repeat for total of 2 trials  Seated hip flex (step tap onto 6" block) x 10 reps Seated calf raise (Toes on 1/2 foam) x 10 reps each LE Ambulation approx 220-320 feet  (RPE 7/10 after second walk) with 3lb AW BLE  Repeat 2 trials  Sit to stand  with outstretched UE (holding rainbow colored ball) x 5 with VC for correct technique.   Ambulation x 300 feet with 3lb AW, Supervision- Good reciprocal steps without any unsteadiness (Patient limited by fatigue)       Education provided throughout session via VC/TC and demonstration to facilitate movement at target joints and correct muscle activation for all testing and exercises performed.        PATIENT EDUCATION: Education details: Exercise technique, progress towards remainder goals Person educated: Patient Education method: Explanation, Demonstration, Tactile cues, and Verbal cues Education comprehension: verbalized understanding, returned demonstration, verbal cues required, tactile cues required, and needs further education   HOME EXERCISE PROGRAM: No updates   PT Short Term Goals -      PT SHORT TERM GOAL #1   Title Pt will be independent with HEP in order to improve strength and balance in order to decrease fall risk and improve function at home and work.    Baseline 02/02/2021: Patient reports limited HEP in place currently from [facility] agency 12/14 compliance; 1/20/2023Patient states no questions  regarding his current exercise regimen and states he is compliant.    Time 6    Period Weeks    Status Achieved    Target Date 03/16/21              PT Long Term Goals -       PT LONG TERM GOAL #1   Title Pt will improve FOTO to target score of 50 to display perceived improvements in ability to complete ADL's.    Baseline 02/02/2021= 43 12/143: 51%; 04/29/2021= 53%; 09/09/2021= 53; 10/12/2021=54%   Time 12    Period Weeks    Status Achieved      PT LONG TERM GOAL #2   Title Pt will decrease 5TSTS by at least 8 seconds in order to demonstrate clinically significant improvement in LE strength.    Baseline 02/02/2021= 28.22 sec with BUE Support 12/14: 23.1 seconds; 04/29/2021=Unable to test today due to patient became nauseous with 6 min walk test and attempted TUG- unable to continue- will reassess next 1-2 visits; 06/08/2021= 25.1 sec with min BUE  support; 07/20/2021= 17.75 with min BUE support    Time 12    Period Weeks    Status Achieved    Target Date 07/22/21      PT LONG TERM GOAL #3   Title Pt will decrease TUG to below 19 seconds/decrease in order to demonstrate decreased fall risk.    Baseline 02/02/2021= 27.30 sec without AD 12/14: 23 seconds; 04/29/2021- Unable to assess as patient became very nauseated upon standing and request to stop treatment. 06/08/2021= 19.10 sec without an AD; 07/20/2021= 15.68 sec without UE Support    Time 12    Period Weeks    Status Achieved    Target Date 07/22/21      PT LONG TERM GOAL #4   Title Pt will increase 6MWT by at least 4m(1672f in order to demonstrate clinically significant improvement in cardiopulmonary endurance and community ambulation    Baseline 02/02/2021= 83 feet in 1 min 10 sec wihtout an AD 12/14: 200 ft in  51m81mtes; 04/29/2021=Patient ambulated approx 60 feet yet had to stop due onset of nausea and unable to complete today; 07/15/2021= 330 feet in 2 min 25 sec without AD.  09/09/2021= 350 feet in 3 min 45 sec. 10/12/2021= 710 feet in  6 min- no AD (Short stand rest break)    Time 12    Period Weeks    Status Achieved   Target Date 07/22/21      PT LONG TERM GOAL #5   Title Pt will increase 10MWT by at least 0.2 m/s in order to demonstrate clinically significant improvement in community ambulation.    Baseline 02/02/2021= 0.42 m/s 12/14: 0.49 m/s; 04/29/2021=Patient unable to test today secondary to being nauseated. 06/08/2021= 0.62 m/s; 07/20/2021= 0.87 m/s    Time 12    Period Weeks    Status Achieved    Target Date 07/22/21      PT LONG TERM GOAL #6   Title Pt will decrease 5TSTS by at least 3 seconds in order to demonstrate clinically significant improvement in LE strength.    Baseline 07/20/2021= 17.75 sec with Minimal BUE Support; 09/09/2021= 17.50 sec with very minimal UE Support. 10/12/2021= 14.90 sec with min BUE Support. 12/05/2021= 14.30 sec with BUE; 01/09/2022= 23.11 sec with min UE on thighs vs. Arm rests for more emphasis on BLE.     Time 12    Period Weeks    Status Ongoing   Target Date 04/03/2022   PT LONG TERM GOAL #7 Title:  Pt will increase 6MWT by at least 61m84m4ft29f order to demonstrate clinically significant improvement in cardiopulmonary endurance and community ambulation                                                       Baseline: 10/12/2021= 710 feet; 12/05/2021= 827 feet; 01/09/2022= 620 feet without an AD - stopping at 4 min mark due to self reported fatigue.  Goal status: ONGOING Target date: 04/03/2022        PT LONG TERM GOAL #8  Title Patient will demonstrate an improved Berg Balance Score of > 50/56 as to demonstrate improved balance with ADLs such as sitting/standing and transfer balance and reduced fall risk.  Baseline 01/09/2022= 47/56  Time 12   Period Weeks   Status INITIAL  Target Date 04/03/2022  Plan -      Clinical Impression Statement Patient presents with good motivation and overall feeling better physically today. He was able to participate in all therex well-  limited by continued fatigue/weakness yet less rest breaks and much improved overall gait distance covered over span of visitPt will continue to benefit from skilled physical therapy intervention to address impairments, improve QOL, and attain therapy goals.     Personal Factors and Comorbidities Comorbidity 3+    Comorbidities DM, Liver transplant, ESRD, CAD, feeding tube    Examination-Activity Limitations Carry;Lift;Reach Overhead;Squat;Stairs;Stand;Transfers;Toileting    Examination-Participation Restrictions Cleaning;Community Activity;Driving;Medication Management;Meal Prep;Yard Work    [employer] officer Evolving/Moderate complexity    Rehab Potential Fair    PT Frequency 2x / week    PT Duration 12 weeks    PT Treatment/Interventions ADLs/Self Care Home Management;Cryotherapy;Moist Heat;DME Instruction;Gait training;Stair training;Functional mobility training;Therapeutic activities;Therapeutic exercise;Balance training;Neuromuscular re-education;Patient/family education;Wheelchair mobility training;Manual techniques;Passive range of motion;Dry needling;Energy conservation;Joint Manipulations    PT Next Visit Plan Progressive Therapeutic exercises, Balance training, transfer/gait training    PT Home Exercise Plan No changes or updates today.    Consulted and Agree with Plan of Care Patient              Lewis Moccasin PT  01/30/2022, 2:47 PM

## 2022-02-01 ENCOUNTER — Ambulatory Visit: Payer: Medicare Other

## 2022-02-01 DIAGNOSIS — R269 Unspecified abnormalities of gait and mobility: Secondary | ICD-10-CM

## 2022-02-01 DIAGNOSIS — M6281 Muscle weakness (generalized): Secondary | ICD-10-CM

## 2022-02-01 DIAGNOSIS — R262 Difficulty in walking, not elsewhere classified: Secondary | ICD-10-CM | POA: Diagnosis not present

## 2022-02-01 DIAGNOSIS — R278 Other lack of coordination: Secondary | ICD-10-CM

## 2022-02-01 DIAGNOSIS — R2689 Other abnormalities of gait and mobility: Secondary | ICD-10-CM

## 2022-02-01 DIAGNOSIS — R296 Repeated falls: Secondary | ICD-10-CM

## 2022-02-01 DIAGNOSIS — R2681 Unsteadiness on feet: Secondary | ICD-10-CM

## 2022-02-01 NOTE — Therapy (Unsigned)
OUTPATIENT PHYSICAL THERAPY TREATMENT NOTE    Patient Name: Alex Hampton MRN: [id number] DOB:08-18-1955, 66 y.o., male Today's Date: 02/02/2022  PCP: Dr. Juluis Pitch REFERRING PROVIDER: Eulis Canner, MD   PT End of Session - 02/01/22 1350     Visit Number 72    Number of Visits 8    Date for PT Re-Evaluation 04/03/22    Authorization Type Medicare/BCBS; PN 03/23/21    Authorization Time Period Initial Cert= 28/31/5176- 16/10/3708; Recert 10/04/9483-4/62/7035: Recert 0/12/3816- 05/19/9369: Recert 09/17/6787- 3/81/0175; Recert 01/10/5851- 77/82/4235    Progress Note Due on Visit 80    PT Start Time 1345    PT Stop Time 1425    PT Time Calculation (min) 40 min    Equipment Utilized During Treatment Gait belt    Activity Tolerance Patient limited by fatigue;Patient tolerated treatment well;No increased pain    Behavior During Therapy WFL for tasks assessed/performed                        Past Medical History:  Diagnosis Date   Depression    History of cardiac cath    History of heart artery stent    Hyperlipidemia    Hypertension    MI (myocardial infarction) ([place])    Renal disorder    Past Surgical History:  Procedure Laterality Date   IR [place] TUBE CHANGE  11/22/2020   IR [place] TUBE CHANGE  11/26/2020   IR [place] GASTRO/COLONIC TUBE PERCUT W/FLUORO  10/14/2020   LEFT HEART CATH AND CORONARY ANGIOGRAPHY N/A 08/19/2020   Procedure: LEFT HEART CATH AND CORONARY ANGIOGRAPHY;  Surgeon: Corey Skains, MD;  Location: [place] [facility];  Service: Cardiovascular;  Laterality: N/A;   LIVER TRANSPLANT     Patient Active Problem List   Diagnosis Date Noted   Pulmonary embolism ([place]) 08/08/2021   CAP (community acquired pneumonia) 08/08/2021   Protein calorie malnutrition ([place]) 08/08/2021   Hypokalemia 08/08/2021   Pancreatic cyst    Verbal auditory hallucination    Early satiety    Feeding tube dysfunction    Generalized weakness 11/26/2020    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[place]) 11/26/2020   Recurrent falls 11/26/2020   Syncope 10/15/2020   Uremia 10/10/2020   ESRD on hemodialysis ([place]) 10/10/2020   Hyperkalemia 10/10/2020   Elevated troponin 10/10/2020   GERD (gastroesophageal reflux disease) 10/10/2020   CAD (coronary artery disease) 10/10/2020   G tube feedings ([place]) 10/10/2020   Diarrhea 10/10/2020   Diabetes mellitus ([place]) 10/10/2020   Weakness    ACS (acute coronary syndrome) ([place])    Liver transplant recipient [facility])    Anemia of chronic disease    Type 2 diabetes mellitus with diabetic neuropathy, with long-term current use of insulin ([place])    ESRD (end stage renal disease) ([place]) 08/19/2020   History of anemia due to chronic kidney disease 08/19/2020   Gait abnormality 10/17/2019   History of fall within past 90 days 10/17/2019   Visual hallucination 10/17/2019   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 10/17/2019   HTN (hypertension) 10/17/2019   History of MI (myocardial infarction) 10/17/2019   OSA (obstructive sleep apnea) 10/17/2019   Nondisplaced comminuted supracondylar fracture without intercondylar fracture of left humerus, subsequent encounter for fracture with nonunion 10/17/2019   Anxiety and depression 10/17/2019    REFERRING DIAG: Other malaise Z94.4 (ICD-10-CM) - Liver transplant status R26.9 (ICD-10-CM) - Unspecified abnormalities of gait and mobility   THERAPY DIAG:  Difficulty in walking, not elsewhere classified  Abnormality of gait and mobility  Muscle weakness (generalized)  Other abnormalities of gait and mobility  Other lack of coordination  Unsteadiness on feet  Repeated falls  Rationale for Evaluation and Treatment Rehabilitation  PERTINENT HISTORY: Patient is a 66 year old male with recent referral for abnomality of gait and recent liver transplant in November 2021. He has past medical history significant for Liver transplant (02/18/2020), CKD, End stage renal disease- On  hemodialyis T/TH/Sat., Anemia, DM with neuropathy, Non-stemi, Coronary artery disease, Feeding tube with poor appetite, PE now on eliquis   PRECAUTIONS: none  SUBJECTIVE: Patient reports feeling pretty good today again today without any issues. Denies any pain or falls.  PAIN:  Are you having pain? No     TODAY'S TREATMENT:  10/25/023    NMR: At support bar with CGA and use of gait belt unless otherwise noted  Step tap without UE support onto 6"block x 12 reps each LE  Static stand (back LE on airex pad and front LE on 6" step)  with horizontal head turns  x 10 each direction- (intermittent UE support)  Staggered stand (feet on either side of 1/2 foam roll) x 30 sec hold with EO- No unsteadiness so progressed to EC x 30 sec (more unstable with eyes closed)  Tandem stance- firm surface (hold 30 sec) x 3 each LE- Difficulty- able to maintain mostly around 10-15 sec but did imrpove up to 30 sec on 3rd trial.  Single leg stance (attempt to hold as long as possible) - from 2-7 sec at best today.  Static stand on incline (rocker board) x 30 sec with EO and 30 sec EC (no LOB or significant unsteadiness)  Lateral step with diagonal reaching overhead (on right and to shoulder height level on left due to limited left shoulder ROM) x 5 reps each.     Therex:  Ambulation x 320 feet with SBA midway through workout Ambulation x 320 feet with SBA at end of session        Education provided throughout session via VC/TC and demonstration to facilitate movement at target joints and correct muscle activation for all testing and exercises performed.        PATIENT EDUCATION: Education details: Exercise technique, progress towards remainder goals Person educated: Patient Education method: Explanation, Demonstration, Tactile cues, and Verbal cues Education comprehension: verbalized understanding, returned demonstration, verbal cues required, tactile cues required, and needs further  education   HOME EXERCISE PROGRAM: No updates   PT Short Term Goals -      PT SHORT TERM GOAL #1   Title Pt will be independent with HEP in order to improve strength and balance in order to decrease fall risk and improve function at home and work.    Baseline 02/02/2021: Patient reports limited HEP in place currently from [facility] agency 12/14 compliance; 1/20/2023Patient states no questions regarding his current exercise regimen and states he is compliant.    Time 6    Period Weeks    Status Achieved    Target Date 03/16/21              PT Long Term Goals -       PT LONG TERM GOAL #1   Title Pt will improve FOTO to target score of 50 to display perceived improvements in ability to complete ADL's.    Baseline 02/02/2021= 43 12/143: 51%; 04/29/2021= 53%; 09/09/2021= 53; 10/12/2021=54%   Time 12  Period Weeks    Status Achieved      PT LONG TERM GOAL #2   Title Pt will decrease 5TSTS by at least 8 seconds in order to demonstrate clinically significant improvement in LE strength.    Baseline 02/02/2021= 28.22 sec with BUE Support 12/14: 23.1 seconds; 04/29/2021=Unable to test today due to patient became nauseous with 6 min walk test and attempted TUG- unable to continue- will reassess next 1-2 visits; 06/08/2021= 25.1 sec with min BUE support; 07/20/2021= 17.75 with min BUE support    Time 12    Period Weeks    Status Achieved    Target Date 07/22/21      PT LONG TERM GOAL #3   Title Pt will decrease TUG to below 19 seconds/decrease in order to demonstrate decreased fall risk.    Baseline 02/02/2021= 27.30 sec without AD 12/14: 23 seconds; 04/29/2021- Unable to assess as patient became very nauseated upon standing and request to stop treatment. 06/08/2021= 19.10 sec without an AD; 07/20/2021= 15.68 sec without UE Support    Time 12    Period Weeks    Status Achieved    Target Date 07/22/21      PT LONG TERM GOAL #4   Title Pt will increase 6MWT by at least 20m(1675f in order to  demonstrate clinically significant improvement in cardiopulmonary endurance and community ambulation    Baseline 02/02/2021= 83 feet in 1 min 10 sec wihtout an AD 12/14: 200 ft in  52m29mtes; 04/29/2021=Patient ambulated approx 60 feet yet had to stop due onset of nausea and unable to complete today; 07/15/2021= 330 feet in 2 min 25 sec without AD.  09/09/2021= 350 feet in 3 min 45 sec. 10/12/2021= 710 feet in 6 min- no AD (Short stand rest break)    Time 12    Period Weeks    Status Achieved   Target Date 07/22/21      PT LONG TERM GOAL #5   Title Pt will increase 10MWT by at least 0.2 m/s in order to demonstrate clinically significant improvement in community ambulation.    Baseline 02/02/2021= 0.42 m/s 12/14: 0.49 m/s; 04/29/2021=Patient unable to test today secondary to being nauseated. 06/08/2021= 0.62 m/s; 07/20/2021= 0.87 m/s    Time 12    Period Weeks    Status Achieved    Target Date 07/22/21      PT LONG TERM GOAL #6   Title Pt will decrease 5TSTS by at least 3 seconds in order to demonstrate clinically significant improvement in LE strength.    Baseline 07/20/2021= 17.75 sec with Minimal BUE Support; 09/09/2021= 17.50 sec with very minimal UE Support. 10/12/2021= 14.90 sec with min BUE Support. 12/05/2021= 14.30 sec with BUE; 01/09/2022= 23.11 sec with min UE on thighs vs. Arm rests for more emphasis on BLE.     Time 12    Period Weeks    Status Ongoing   Target Date 04/03/2022   PT LONG TERM GOAL #7 Title:  Pt will increase 6MWT by at least 29m74m4ft16f order to demonstrate clinically significant improvement in cardiopulmonary endurance and community ambulation                                                       Baseline: 10/12/2021= 710 feet; 12/05/2021= 827 feet; 01/09/2022= 620 feet without  an AD - stopping at 4 min mark due to self reported fatigue.  Goal status: ONGOING Target date: 04/03/2022        PT LONG TERM GOAL #8  Title Patient will demonstrate an improved Berg Balance Score of  > 50/56 as to demonstrate improved balance with ADLs such as sitting/standing and transfer balance and reduced fall risk.  Baseline 01/09/2022= 47/56  Time 12   Period Weeks   Status INITIAL  Target Date 04/03/2022        Plan -      Clinical Impression Statement Patient continues to present with good motivation and continues to demonstrate improving overall participation as this week he did not complain of any stomach issues. Able to focus mostly on static and dynamic balance today. He performed well with good overall balance- more challenged with eyes closed on narrowed stance today. Pt will continue to benefit from skilled physical therapy intervention to address impairments, improve QOL, and attain therapy goals.     Personal Factors and Comorbidities Comorbidity 3+    Comorbidities DM, Liver transplant, ESRD, CAD, feeding tube    Examination-Activity Limitations Carry;Lift;Reach Overhead;Squat;Stairs;Stand;Transfers;Toileting    Examination-Participation Restrictions Cleaning;Community Activity;Driving;Medication Management;Meal Prep;Yard Work    [employer] Evolving/Moderate complexity    Rehab Potential Fair    PT Frequency 2x / week    PT Duration 12 weeks    PT Treatment/Interventions ADLs/Self Care Home Management;Cryotherapy;Moist Heat;DME Instruction;Gait training;Stair training;Functional mobility training;Therapeutic activities;Therapeutic exercise;Balance training;Neuromuscular re-education;Patient/family education;Wheelchair mobility training;Manual techniques;Passive range of motion;Dry needling;Energy conservation;Joint Manipulations    PT Next Visit Plan Progressive Therapeutic exercises, Balance training, transfer/gait training    PT Home Exercise Plan No changes or updates today.    Consulted and Agree with Plan of Care Patient              Lewis Moccasin PT  02/02/2022, 7:22 AM

## 2022-02-06 ENCOUNTER — Ambulatory Visit: Payer: Medicare Other

## 2022-02-06 DIAGNOSIS — R262 Difficulty in walking, not elsewhere classified: Secondary | ICD-10-CM | POA: Diagnosis not present

## 2022-02-06 DIAGNOSIS — R2689 Other abnormalities of gait and mobility: Secondary | ICD-10-CM

## 2022-02-06 DIAGNOSIS — M6281 Muscle weakness (generalized): Secondary | ICD-10-CM

## 2022-02-06 DIAGNOSIS — R269 Unspecified abnormalities of gait and mobility: Secondary | ICD-10-CM

## 2022-02-06 DIAGNOSIS — R278 Other lack of coordination: Secondary | ICD-10-CM

## 2022-02-06 DIAGNOSIS — R2681 Unsteadiness on feet: Secondary | ICD-10-CM

## 2022-02-06 NOTE — Therapy (Signed)
OUTPATIENT PHYSICAL THERAPY TREATMENT NOTE    Patient Name: Alex Hampton MRN: [id number] DOB:1955-06-22, 66 y.o., male Today's Date: 02/06/2022  PCP: Dr. Juluis Pitch REFERRING PROVIDER: Eulis Canner, MD   PT End of Session - 02/06/22 1343     Visit Number 66    Number of Visits 22    Date for PT Re-Evaluation 04/03/22    Authorization Type Medicare/BCBS; PN 03/23/21    Authorization Time Period Initial Cert= 57/84/6962- 95/28/4132; Recert 4/40/1027-2/53/6644: Recert 0/34/7425- 12/13/6385: Recert 08/14/4330- 9/51/8841; Recert 66/0/6301- 60/01/9322    Progress Note Due on Visit 80    PT Start Time 1344    PT Stop Time 1422    PT Time Calculation (min) 38 min    Equipment Utilized During Treatment Gait belt    Activity Tolerance Patient limited by fatigue;Patient tolerated treatment well;No increased pain    Behavior During Therapy WFL for tasks assessed/performed                         Past Medical History:  Diagnosis Date   Depression    History of cardiac cath    History of heart artery stent    Hyperlipidemia    Hypertension    MI (myocardial infarction) ([place])    Renal disorder    Past Surgical History:  Procedure Laterality Date   IR [place] TUBE CHANGE  11/22/2020   IR [place] TUBE CHANGE  11/26/2020   IR [place] GASTRO/COLONIC TUBE PERCUT W/FLUORO  10/14/2020   LEFT HEART CATH AND CORONARY ANGIOGRAPHY N/A 08/19/2020   Procedure: LEFT HEART CATH AND CORONARY ANGIOGRAPHY;  Surgeon: Corey Skains, MD;  Location: [place] [facility];  Service: Cardiovascular;  Laterality: N/A;   LIVER TRANSPLANT     Patient Active Problem List   Diagnosis Date Noted   Pulmonary embolism ([place]) 08/08/2021   CAP (community acquired pneumonia) 08/08/2021   Protein calorie malnutrition ([place]) 08/08/2021   Hypokalemia 08/08/2021   Pancreatic cyst    Verbal auditory hallucination    Early satiety    Feeding tube dysfunction    Generalized weakness 11/26/2020    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[place]) 11/26/2020   Recurrent falls 11/26/2020   Syncope 10/15/2020   Uremia 10/10/2020   ESRD on hemodialysis ([place]) 10/10/2020   Hyperkalemia 10/10/2020   Elevated troponin 10/10/2020   GERD (gastroesophageal reflux disease) 10/10/2020   CAD (coronary artery disease) 10/10/2020   G tube feedings ([place]) 10/10/2020   Diarrhea 10/10/2020   Diabetes mellitus ([place]) 10/10/2020   Weakness    ACS (acute coronary syndrome) ([place])    Liver transplant recipient [facility])    Anemia of chronic disease    Type 2 diabetes mellitus with diabetic neuropathy, with long-term current use of insulin ([place])    ESRD (end stage renal disease) ([place]) 08/19/2020   History of anemia due to chronic kidney disease 08/19/2020   Gait abnormality 10/17/2019   History of fall within past 90 days 10/17/2019   Visual hallucination 10/17/2019   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 10/17/2019   HTN (hypertension) 10/17/2019   History of MI (myocardial infarction) 10/17/2019   OSA (obstructive sleep apnea) 10/17/2019   Nondisplaced comminuted supracondylar fracture without intercondylar fracture of left humerus, subsequent encounter for fracture with nonunion 10/17/2019   Anxiety and depression 10/17/2019    REFERRING DIAG: Other malaise Z94.4 (ICD-10-CM) - Liver transplant status R26.9 (ICD-10-CM) - Unspecified abnormalities of gait and mobility   THERAPY DIAG:  Difficulty in walking, not elsewhere classified  Abnormality of gait and mobility  Muscle weakness (generalized)  Other abnormalities of gait and mobility  Other lack of coordination  Unsteadiness on feet  Rationale for Evaluation and Treatment Rehabilitation  PERTINENT HISTORY: Patient is a 66 year old male with recent referral for abnomality of gait and recent liver transplant in November 2021. He has past medical history significant for Liver transplant (02/18/2020), CKD, End stage renal disease- On hemodialyis  T/TH/Sat., Anemia, DM with neuropathy, Non-stemi, Coronary artery disease, Feeding tube with poor appetite, PE now on eliquis   PRECAUTIONS: none  SUBJECTIVE:    PAIN:  Are you having pain? No     TODAY'S TREATMENT:  02/06/2022    NMR: At support bar with CGA and use of gait belt unless otherwise noted  Step tap without UE support onto 6"block x 10 reps each LE with 4lb AW each LE SLS for multiple attempts (up to 6-7 sec) at best    Therex: Step up with UE support onto 6"block x 10 reps each LE with 4lb AW each LE Standing Hip ext with 4lb AW alt LE x 12 reps  Ambulation x 180 feet with SBA and use of 4lb AW (O2 sat = 98% and HR= 73 bpm)  Side step up/over 1/2 foam with 4lb AW- each LE- x 10 reps each Seated hip march with 4lb AW x 10 reps each.  Standing heel cord stretch into calf raise (4lb AW Ambulation x 180 feet with SBA (O2 sat =98% and HR= 74bpm)  Sit to stand with only 1 UE support x 5 reps Sit to stand with BUE support x 5 reps Ambulation x 220 feet with SBA (O2 sat= 95% and HR= 76 pbm)  Seated LAQ with 3 sec hold x 10 reps.  Standing reverse lunge- x 7 reps Alt LE (patient reports as hard)  Ambulation x 220 feet with SBA          Education provided throughout session via VC/TC and demonstration to facilitate movement at target joints and correct muscle activation for all testing and exercises performed.        PATIENT EDUCATION: Education details: Exercise technique, progress towards remainder goals Person educated: Patient Education method: Explanation, Demonstration, Tactile cues, and Verbal cues Education comprehension: verbalized understanding, returned demonstration, verbal cues required, tactile cues required, and needs further education   HOME EXERCISE PROGRAM: No updates   PT Short Term Goals -      PT SHORT TERM GOAL #1   Title Pt will be independent with HEP in order to improve strength and balance in order to decrease fall risk  and improve function at home and work.    Baseline 02/02/2021: Patient reports limited HEP in place currently from [facility] agency 12/14 compliance; 1/20/2023Patient states no questions regarding his current exercise regimen and states he is compliant.    Time 6    Period Weeks    Status Achieved    Target Date 03/16/21              PT Long Term Goals -       PT LONG TERM GOAL #1   Title Pt will improve FOTO to target score of 50 to display perceived improvements in ability to complete ADL's.    Baseline 02/02/2021= 43 12/143: 51%; 04/29/2021= 53%; 09/09/2021= 53; 10/12/2021=54%   Time 12    Period Weeks    Status Achieved      PT LONG TERM  GOAL #2   Title Pt will decrease 5TSTS by at least 8 seconds in order to demonstrate clinically significant improvement in LE strength.    Baseline 02/02/2021= 28.22 sec with BUE Support 12/14: 23.1 seconds; 04/29/2021=Unable to test today due to patient became nauseous with 6 min walk test and attempted TUG- unable to continue- will reassess next 1-2 visits; 06/08/2021= 25.1 sec with min BUE support; 07/20/2021= 17.75 with min BUE support    Time 12    Period Weeks    Status Achieved    Target Date 07/22/21      PT LONG TERM GOAL #3   Title Pt will decrease TUG to below 19 seconds/decrease in order to demonstrate decreased fall risk.    Baseline 02/02/2021= 27.30 sec without AD 12/14: 23 seconds; 04/29/2021- Unable to assess as patient became very nauseated upon standing and request to stop treatment. 06/08/2021= 19.10 sec without an AD; 07/20/2021= 15.68 sec without UE Support    Time 12    Period Weeks    Status Achieved    Target Date 07/22/21      PT LONG TERM GOAL #4   Title Pt will increase 6MWT by at least 26m(1657f in order to demonstrate clinically significant improvement in cardiopulmonary endurance and community ambulation    Baseline 02/02/2021= 83 feet in 1 min 10 sec wihtout an AD 12/14: 200 ft in  2m71mtes; 04/29/2021=Patient ambulated  approx 60 feet yet had to stop due onset of nausea and unable to complete today; 07/15/2021= 330 feet in 2 min 25 sec without AD.  09/09/2021= 350 feet in 3 min 45 sec. 10/12/2021= 710 feet in 6 min- no AD (Short stand rest break)    Time 12    Period Weeks    Status Achieved   Target Date 07/22/21      PT LONG TERM GOAL #5   Title Pt will increase 10MWT by at least 0.2 m/s in order to demonstrate clinically significant improvement in community ambulation.    Baseline 02/02/2021= 0.42 m/s 12/14: 0.49 m/s; 04/29/2021=Patient unable to test today secondary to being nauseated. 06/08/2021= 0.62 m/s; 07/20/2021= 0.87 m/s    Time 12    Period Weeks    Status Achieved    Target Date 07/22/21      PT LONG TERM GOAL #6   Title Pt will decrease 5TSTS by at least 3 seconds in order to demonstrate clinically significant improvement in LE strength.    Baseline 07/20/2021= 17.75 sec with Minimal BUE Support; 09/09/2021= 17.50 sec with very minimal UE Support. 10/12/2021= 14.90 sec with min BUE Support. 12/05/2021= 14.30 sec with BUE; 01/09/2022= 23.11 sec with min UE on thighs vs. Arm rests for more emphasis on BLE.     Time 12    Period Weeks    Status Ongoing   Target Date 04/03/2022   PT LONG TERM GOAL #7 Title:  Pt will increase 6MWT by at least 19m8m4ft61f order to demonstrate clinically significant improvement in cardiopulmonary endurance and community ambulation                                                       Baseline: 10/12/2021= 710 feet; 12/05/2021= 827 feet; 01/09/2022= 620 feet without an AD - stopping at 4 min mark due to self reported fatigue.  Goal  status: ONGOING Target date: 04/03/2022        PT LONG TERM GOAL #8  Title Patient will demonstrate an improved Berg Balance Score of > 50/56 as to demonstrate improved balance with ADLs such as sitting/standing and transfer balance and reduced fall risk.  Baseline 01/09/2022= 47/56  Time 12   Period Weeks   Status INITIAL  Target Date 04/03/2022         Plan -      Clinical Impression Statement Treatment continued to focus on plan including improving LE strength and balance. Patient responded well to today's treatment presenting with improving overall functional endurance - as demonstrated by less rest breaks and more overall standing/walking endurance.  Pt will continue to benefit from skilled physical therapy intervention to address impairments, improve QOL, and attain therapy goals.     Personal Factors and Comorbidities Comorbidity 3+    Comorbidities DM, Liver transplant, ESRD, CAD, feeding tube    Examination-Activity Limitations Carry;Lift;Reach Overhead;Squat;Stairs;Stand;Transfers;Toileting    Examination-Participation Restrictions Cleaning;Community Activity;Driving;Medication Management;Meal Prep;Yard Work    [employer] officer Evolving/Moderate complexity    Rehab Potential Fair    PT Frequency 2x / week    PT Duration 12 weeks    PT Treatment/Interventions ADLs/Self Care Home Management;Cryotherapy;Moist Heat;DME Instruction;Gait training;Stair training;Functional mobility training;Therapeutic activities;Therapeutic exercise;Balance training;Neuromuscular re-education;Patient/family education;Wheelchair mobility training;Manual techniques;Passive range of motion;Dry needling;Energy conservation;Joint Manipulations    PT Next Visit Plan Progressive Therapeutic exercises, Balance training, transfer/gait training    PT Home Exercise Plan No changes or updates today.    Consulted and Agree with Plan of Care Patient              Lewis Moccasin PT  02/06/2022, 2:39 PM

## 2022-02-08 ENCOUNTER — Ambulatory Visit: Payer: Medicare Other | Attending: Gastroenterology

## 2022-02-08 DIAGNOSIS — R269 Unspecified abnormalities of gait and mobility: Secondary | ICD-10-CM | POA: Diagnosis present

## 2022-02-08 DIAGNOSIS — R2681 Unsteadiness on feet: Secondary | ICD-10-CM | POA: Diagnosis present

## 2022-02-08 DIAGNOSIS — M6281 Muscle weakness (generalized): Secondary | ICD-10-CM | POA: Insufficient documentation

## 2022-02-08 DIAGNOSIS — R2689 Other abnormalities of gait and mobility: Secondary | ICD-10-CM | POA: Diagnosis present

## 2022-02-08 DIAGNOSIS — R278 Other lack of coordination: Secondary | ICD-10-CM | POA: Insufficient documentation

## 2022-02-08 DIAGNOSIS — R262 Difficulty in walking, not elsewhere classified: Secondary | ICD-10-CM | POA: Diagnosis not present

## 2022-02-08 DIAGNOSIS — R296 Repeated falls: Secondary | ICD-10-CM | POA: Diagnosis present

## 2022-02-08 NOTE — Therapy (Signed)
OUTPATIENT PHYSICAL THERAPY TREATMENT NOTE    Patient Name: Alex Hampton MRN: [id number] DOB:Dec 12, 1955, 66 y.o., male Today's Date: 02/08/2022  PCP: Dr. Juluis Pitch REFERRING PROVIDER: Eulis Canner, MD   PT End of Session - 02/08/22 1634     Visit Number 74    Number of Visits 11    Date for PT Re-Evaluation 04/03/22    Authorization Type Medicare/BCBS; PN 03/23/21    Authorization Time Period Initial Cert= 83/38/2505- 39/76/7341; Recert 9/37/9024-0/97/3532: Recert 9/92/4268- 06/11/1960: Recert 05/12/9796- 12/30/1939; Recert 74/0/8144- 81/85/6314    Progress Note Due on Visit 76    PT Start Time 1345    PT Stop Time 1428    PT Time Calculation (min) 43 min    Equipment Utilized During Treatment Gait belt    Activity Tolerance Patient tolerated treatment well    Behavior During Therapy WFL for tasks assessed/performed                          Past Medical History:  Diagnosis Date   Depression    History of cardiac cath    History of heart artery stent    Hyperlipidemia    Hypertension    MI (myocardial infarction) ([place])    Renal disorder    Past Surgical History:  Procedure Laterality Date   IR [place] TUBE CHANGE  11/22/2020   IR [place] TUBE CHANGE  11/26/2020   IR [place] GASTRO/COLONIC TUBE PERCUT W/FLUORO  10/14/2020   LEFT HEART CATH AND CORONARY ANGIOGRAPHY N/A 08/19/2020   Procedure: LEFT HEART CATH AND CORONARY ANGIOGRAPHY;  Surgeon: Corey Skains, MD;  Location: [facility];  Service: Cardiovascular;  Laterality: N/A;   LIVER TRANSPLANT     Patient Active Problem List   Diagnosis Date Noted   Pulmonary embolism ([place]) 08/08/2021   CAP (community acquired pneumonia) 08/08/2021   Protein calorie malnutrition ([place]) 08/08/2021   Hypokalemia 08/08/2021   Pancreatic cyst    Verbal auditory hallucination    Early satiety    Feeding tube dysfunction    Generalized weakness 11/26/2020   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[place])  11/26/2020   Recurrent falls 11/26/2020   Syncope 10/15/2020   Uremia 10/10/2020   ESRD on hemodialysis ([place]) 10/10/2020   Hyperkalemia 10/10/2020   Elevated troponin 10/10/2020   GERD (gastroesophageal reflux disease) 10/10/2020   CAD (coronary artery disease) 10/10/2020   G tube feedings ([place]) 10/10/2020   Diarrhea 10/10/2020   Diabetes mellitus ([place]) 10/10/2020   Weakness    ACS (acute coronary syndrome) ([place])    Liver transplant recipient [facility])    Anemia of chronic disease    Type 2 diabetes mellitus with diabetic neuropathy, with long-term current use of insulin ([place])    ESRD (end stage renal disease) ([place]) 08/19/2020   History of anemia due to chronic kidney disease 08/19/2020   Gait abnormality 10/17/2019   History of fall within past 90 days 10/17/2019   Visual hallucination 10/17/2019   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 10/17/2019   HTN (hypertension) 10/17/2019   History of MI (myocardial infarction) 10/17/2019   OSA (obstructive sleep apnea) 10/17/2019   Nondisplaced comminuted supracondylar fracture without intercondylar fracture of left humerus, subsequent encounter for fracture with nonunion 10/17/2019   Anxiety and depression 10/17/2019    REFERRING DIAG: Other malaise Z94.4 (ICD-10-CM) - Liver transplant status R26.9 (ICD-10-CM) - Unspecified abnormalities of gait and mobility   THERAPY DIAG:  Difficulty in walking,  not elsewhere classified  Abnormality of gait and mobility  Muscle weakness (generalized)  Other abnormalities of gait and mobility  Other lack of coordination  Unsteadiness on feet  Rationale for Evaluation and Treatment Rehabilitation  PERTINENT HISTORY: Patient is a 66 year old male with recent referral for abnomality of gait and recent liver transplant in November 2021. He has past medical history significant for Liver transplant (02/18/2020), CKD, End stage renal disease- On hemodialyis T/TH/Sat., Anemia, DM with neuropathy,  Non-stemi, Coronary artery disease, Feeding tube with poor appetite, PE now on eliquis   PRECAUTIONS: none  SUBJECTIVE:    PAIN:  Are you having pain? No     TODAY'S TREATMENT:  02/08/2022      Therex:  Seated ham curl with cable system 2.5# BE 2 sets x 10 reps Standing Hip abd with cable system 2.5# BLE 2 sets x 10 reps Standing Hip ext with cable system 2.5 # BLE 2 sets x 10 reps Standing heel to toe gait sequencing using cable sys- 2.5 # each LE - 2sets of 10 reps each Sit to stand with only 1 UE support x 5 reps  Sit to stand without UE support x 5 reps (patient achieved for the 1st time)  Ambulation x 330 feet with SBA and use of 4lb AW (O2 sat = 98% and HR= 73 bpm)       Education provided throughout session via VC/TC and demonstration to facilitate movement at target joints and correct muscle activation for all testing and exercises performed.        PATIENT EDUCATION: Education details: Exercise technique, progress towards remainder goals Person educated: Patient Education method: Explanation, Demonstration, Tactile cues, and Verbal cues Education comprehension: verbalized understanding, returned demonstration, verbal cues required, tactile cues required, and needs further education   HOME EXERCISE PROGRAM: No updates   PT Short Term Goals -      PT SHORT TERM GOAL #1   Title Pt will be independent with HEP in order to improve strength and balance in order to decrease fall risk and improve function at home and work.    Baseline 02/02/2021: Patient reports limited HEP in place currently from [facility] agency 12/14 compliance; 1/20/2023Patient states no questions regarding his current exercise regimen and states he is compliant.    Time 6    Period Weeks    Status Achieved    Target Date 03/16/21              PT Long Term Goals -       PT LONG TERM GOAL #1   Title Pt will improve FOTO to target score of 50 to display perceived improvements in  ability to complete ADL's.    Baseline 02/02/2021= 43 12/143: 51%; 04/29/2021= 53%; 09/09/2021= 53; 10/12/2021=54%   Time 12    Period Weeks    Status Achieved      PT LONG TERM GOAL #2   Title Pt will decrease 5TSTS by at least 8 seconds in order to demonstrate clinically significant improvement in LE strength.    Baseline 02/02/2021= 28.22 sec with BUE Support 12/14: 23.1 seconds; 04/29/2021=Unable to test today due to patient became nauseous with 6 min walk test and attempted TUG- unable to continue- will reassess next 1-2 visits; 06/08/2021= 25.1 sec with min BUE support; 07/20/2021= 17.75 with min BUE support    Time 12    Period Weeks    Status Achieved    Target Date 07/22/21  PT LONG TERM GOAL #3   Title Pt will decrease TUG to below 19 seconds/decrease in order to demonstrate decreased fall risk.    Baseline 02/02/2021= 27.30 sec without AD 12/14: 23 seconds; 04/29/2021- Unable to assess as patient became very nauseated upon standing and request to stop treatment. 06/08/2021= 19.10 sec without an AD; 07/20/2021= 15.68 sec without UE Support    Time 12    Period Weeks    Status Achieved    Target Date 07/22/21      PT LONG TERM GOAL #4   Title Pt will increase 6MWT by at least 51m(1696f in order to demonstrate clinically significant improvement in cardiopulmonary endurance and community ambulation    Baseline 02/02/2021= 83 feet in 1 min 10 sec wihtout an AD 12/14: 200 ft in  64m17mtes; 04/29/2021=Patient ambulated approx 60 feet yet had to stop due onset of nausea and unable to complete today; 07/15/2021= 330 feet in 2 min 25 sec without AD.  09/09/2021= 350 feet in 3 min 45 sec. 10/12/2021= 710 feet in 6 min- no AD (Short stand rest break)    Time 12    Period Weeks    Status Achieved   Target Date 07/22/21      PT LONG TERM GOAL #5   Title Pt will increase 10MWT by at least 0.2 m/s in order to demonstrate clinically significant improvement in community ambulation.    Baseline 02/02/2021=  0.42 m/s 12/14: 0.49 m/s; 04/29/2021=Patient unable to test today secondary to being nauseated. 06/08/2021= 0.62 m/s; 07/20/2021= 0.87 m/s    Time 12    Period Weeks    Status Achieved    Target Date 07/22/21      PT LONG TERM GOAL #6   Title Pt will decrease 5TSTS by at least 3 seconds in order to demonstrate clinically significant improvement in LE strength.    Baseline 07/20/2021= 17.75 sec with Minimal BUE Support; 09/09/2021= 17.50 sec with very minimal UE Support. 10/12/2021= 14.90 sec with min BUE Support. 12/05/2021= 14.30 sec with BUE; 01/09/2022= 23.11 sec with min UE on thighs vs. Arm rests for more emphasis on BLE.     Time 12    Period Weeks    Status Ongoing   Target Date 04/03/2022   PT LONG TERM GOAL #7 Title:  Pt will increase 6MWT by at least 45m22m4ft62f order to demonstrate clinically significant improvement in cardiopulmonary endurance and community ambulation                                                       Baseline: 10/12/2021= 710 feet; 12/05/2021= 827 feet; 01/09/2022= 620 feet without an AD - stopping at 4 min mark due to self reported fatigue.  Goal status: ONGOING Target date: 04/03/2022        PT LONG TERM GOAL #8  Title Patient will demonstrate an improved Berg Balance Score of > 50/56 as to demonstrate improved balance with ADLs such as sitting/standing and transfer balance and reduced fall risk.  Baseline 01/09/2022= 47/56  Time 12   Period Weeks   Status INITIAL  Target Date 04/03/2022        Plan -      Clinical Impression Statement Patient introduced to resistive LE strengthening with use of cable system today. Patient continues to present with fatigue  as limiting factor but did demonstrate ability to stand without UE support for the 1st time today indicating a significant improvement in LE strength.   Pt will continue to benefit from skilled physical therapy intervention to address impairments, improve QOL, and attain therapy goals.     Personal Factors  and Comorbidities Comorbidity 3+    Comorbidities DM, Liver transplant, ESRD, CAD, feeding tube    Examination-Activity Limitations Carry;Lift;Reach Overhead;Squat;Stairs;Stand;Transfers;Toileting    Examination-Participation Restrictions Cleaning;Community Activity;Driving;Medication Management;Meal Prep;Yard Work    [employer] Evolving/Moderate complexity    Rehab Potential Fair    PT Frequency 2x / week    PT Duration 12 weeks    PT Treatment/Interventions ADLs/Self Care Home Management;Cryotherapy;Moist Heat;DME Instruction;Gait training;Stair training;Functional mobility training;Therapeutic activities;Therapeutic exercise;Balance training;Neuromuscular re-education;Patient/family education;Wheelchair mobility training;Manual techniques;Passive range of motion;Dry needling;Energy conservation;Joint Manipulations    PT Next Visit Plan Progressive Therapeutic exercises, Balance training, transfer/gait training    PT Home Exercise Plan No changes or updates today.    Consulted and Agree with Plan of Care Patient              Lewis Moccasin PT  02/08/2022, 4:54 PM

## 2022-02-13 ENCOUNTER — Ambulatory Visit: Payer: Medicare Other

## 2022-02-13 DIAGNOSIS — R2681 Unsteadiness on feet: Secondary | ICD-10-CM

## 2022-02-13 DIAGNOSIS — R262 Difficulty in walking, not elsewhere classified: Secondary | ICD-10-CM | POA: Diagnosis not present

## 2022-02-13 DIAGNOSIS — R2689 Other abnormalities of gait and mobility: Secondary | ICD-10-CM

## 2022-02-13 DIAGNOSIS — R269 Unspecified abnormalities of gait and mobility: Secondary | ICD-10-CM

## 2022-02-13 DIAGNOSIS — M6281 Muscle weakness (generalized): Secondary | ICD-10-CM

## 2022-02-13 DIAGNOSIS — R278 Other lack of coordination: Secondary | ICD-10-CM

## 2022-02-13 NOTE — Therapy (Signed)
OUTPATIENT PHYSICAL THERAPY TREATMENT NOTE    Patient Name: Alex Hampton MRN: [id number] DOB:1955/05/10, 66 y.o., male Today's Date: 02/13/2022  PCP: Dr. Juluis Pitch REFERRING PROVIDER: Eulis Canner, MD   PT End of Session - 02/13/22 1400     Visit Number 21    Number of Visits 16    Date for PT Re-Evaluation 04/03/22    Authorization Type Medicare/BCBS; PN 03/23/21    Authorization Time Period Initial Cert= 70/35/0093- 81/82/9937; Recert 1/69/6789-3/81/0175: Recert 04/11/5850- 10/14/8240: Recert 06/12/3612- 4/31/5400; Recert 86/10/6193- 09/32/6712    Progress Note Due on Visit 63    PT Start Time 1353    PT Stop Time 1431    PT Time Calculation (min) 38 min    Equipment Utilized During Treatment Gait belt    Activity Tolerance Patient tolerated treatment well    Behavior During Therapy WFL for tasks assessed/performed                           Past Medical History:  Diagnosis Date   Depression    History of cardiac cath    History of heart artery stent    Hyperlipidemia    Hypertension    MI (myocardial infarction) ([place])    Renal disorder    Past Surgical History:  Procedure Laterality Date   IR [place] TUBE CHANGE  11/22/2020   IR [place] TUBE CHANGE  11/26/2020   IR [place] GASTRO/COLONIC TUBE PERCUT W/FLUORO  10/14/2020   LEFT HEART CATH AND CORONARY ANGIOGRAPHY N/A 08/19/2020   Procedure: LEFT HEART CATH AND CORONARY ANGIOGRAPHY;  Surgeon: Corey Skains, MD;  Location: [facility];  Service: Cardiovascular;  Laterality: N/A;   LIVER TRANSPLANT     Patient Active Problem List   Diagnosis Date Noted   Pulmonary embolism ([place]) 08/08/2021   CAP (community acquired pneumonia) 08/08/2021   Protein calorie malnutrition ([place]) 08/08/2021   Hypokalemia 08/08/2021   Pancreatic cyst    Verbal auditory hallucination    Early satiety    Feeding tube dysfunction    Generalized weakness 11/26/2020   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[place])  11/26/2020   Recurrent falls 11/26/2020   Syncope 10/15/2020   Uremia 10/10/2020   ESRD on hemodialysis ([place]) 10/10/2020   Hyperkalemia 10/10/2020   Elevated troponin 10/10/2020   GERD (gastroesophageal reflux disease) 10/10/2020   CAD (coronary artery disease) 10/10/2020   G tube feedings ([place]) 10/10/2020   Diarrhea 10/10/2020   Diabetes mellitus ([place]) 10/10/2020   Weakness    ACS (acute coronary syndrome) ([place])    Liver transplant recipient [facility])    Anemia of chronic disease    Type 2 diabetes mellitus with diabetic neuropathy, with long-term current use of insulin ([place])    ESRD (end stage renal disease) ([place]) 08/19/2020   History of anemia due to chronic kidney disease 08/19/2020   Gait abnormality 10/17/2019   History of fall within past 90 days 10/17/2019   Visual hallucination 10/17/2019   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 10/17/2019   HTN (hypertension) 10/17/2019   History of MI (myocardial infarction) 10/17/2019   OSA (obstructive sleep apnea) 10/17/2019   Nondisplaced comminuted supracondylar fracture without intercondylar fracture of left humerus, subsequent encounter for fracture with nonunion 10/17/2019   Anxiety and depression 10/17/2019    REFERRING DIAG: Other malaise Z94.4 (ICD-10-CM) - Liver transplant status R26.9 (ICD-10-CM) - Unspecified abnormalities of gait and mobility   THERAPY DIAG:  Difficulty in  walking, not elsewhere classified  Abnormality of gait and mobility  Muscle weakness (generalized)  Other abnormalities of gait and mobility  Other lack of coordination  Unsteadiness on feet  Rationale for Evaluation and Treatment Rehabilitation  PERTINENT HISTORY: Patient is a 66 year old male with recent referral for abnomality of gait and recent liver transplant in November 2021. He has past medical history significant for Liver transplant (02/18/2020), CKD, End stage renal disease- On hemodialyis T/TH/Sat., Anemia, DM with neuropathy,  Non-stemi, Coronary artery disease, Feeding tube with poor appetite, PE now on eliquis   PRECAUTIONS: none  SUBJECTIVE: Patient reports stomach is slightly upset but agreeable to do what he can today as far as strengthening.    PAIN:  Are you having pain? No     TODAY'S TREATMENT:  02/10/2022      Therex:  Seated ham curl with cable system 7.5# BE 2 sets x 10 reps Standing Hip ext with cable system 7.5 # BLE 2 sets x 10 reps Standing heel to toe gait sequencing using cable sys- 7.5 # each LE - 2sets of 10 reps each TRX squat (BUE support on TRX straps) x 10 reps (patient reports as hard Sit to stand with hands on knees x 5 Sit to stand without UE support (arms cross chest) x 5 Ambulation x 160 feet x 2 trials with SBA. No LOB      Education provided throughout session via VC/TC and demonstration to facilitate movement at target joints and correct muscle activation for all testing and exercises performed.        PATIENT EDUCATION: Education details: Exercise technique, progress towards remainder goals Person educated: Patient Education method: Explanation, Demonstration, Tactile cues, and Verbal cues Education comprehension: verbalized understanding, returned demonstration, verbal cues required, tactile cues required, and needs further education   HOME EXERCISE PROGRAM: No updates   PT Short Term Goals -      PT SHORT TERM GOAL #1   Title Pt will be independent with HEP in order to improve strength and balance in order to decrease fall risk and improve function at home and work.    Baseline 02/02/2021: Patient reports limited HEP in place currently from [facility] agency 12/14 compliance; 1/20/2023Patient states no questions regarding his current exercise regimen and states he is compliant.    Time 6    Period Weeks    Status Achieved    Target Date 03/16/21              PT Long Term Goals -       PT LONG TERM GOAL #1   Title Pt will improve FOTO to  target score of 50 to display perceived improvements in ability to complete ADL's.    Baseline 02/02/2021= 43 12/143: 51%; 04/29/2021= 53%; 09/09/2021= 53; 10/12/2021=54%   Time 12    Period Weeks    Status Achieved      PT LONG TERM GOAL #2   Title Pt will decrease 5TSTS by at least 8 seconds in order to demonstrate clinically significant improvement in LE strength.    Baseline 02/02/2021= 28.22 sec with BUE Support 12/14: 23.1 seconds; 04/29/2021=Unable to test today due to patient became nauseous with 6 min walk test and attempted TUG- unable to continue- will reassess next 1-2 visits; 06/08/2021= 25.1 sec with min BUE support; 07/20/2021= 17.75 with min BUE support    Time 12    Period Weeks    Status Achieved    Target Date 07/22/21  PT LONG TERM GOAL #3   Title Pt will decrease TUG to below 19 seconds/decrease in order to demonstrate decreased fall risk.    Baseline 02/02/2021= 27.30 sec without AD 12/14: 23 seconds; 04/29/2021- Unable to assess as patient became very nauseated upon standing and request to stop treatment. 06/08/2021= 19.10 sec without an AD; 07/20/2021= 15.68 sec without UE Support    Time 12    Period Weeks    Status Achieved    Target Date 07/22/21      PT LONG TERM GOAL #4   Title Pt will increase 6MWT by at least 44m(1666f in order to demonstrate clinically significant improvement in cardiopulmonary endurance and community ambulation    Baseline 02/02/2021= 83 feet in 1 min 10 sec wihtout an AD 12/14: 200 ft in  28m47mtes; 04/29/2021=Patient ambulated approx 60 feet yet had to stop due onset of nausea and unable to complete today; 07/15/2021= 330 feet in 2 min 25 sec without AD.  09/09/2021= 350 feet in 3 min 45 sec. 10/12/2021= 710 feet in 6 min- no AD (Short stand rest break)    Time 12    Period Weeks    Status Achieved   Target Date 07/22/21      PT LONG TERM GOAL #5   Title Pt will increase 10MWT by at least 0.2 m/s in order to demonstrate clinically significant  improvement in community ambulation.    Baseline 02/02/2021= 0.42 m/s 12/14: 0.49 m/s; 04/29/2021=Patient unable to test today secondary to being nauseated. 06/08/2021= 0.62 m/s; 07/20/2021= 0.87 m/s    Time 12    Period Weeks    Status Achieved    Target Date 07/22/21      PT LONG TERM GOAL #6   Title Pt will decrease 5TSTS by at least 3 seconds in order to demonstrate clinically significant improvement in LE strength.    Baseline 07/20/2021= 17.75 sec with Minimal BUE Support; 09/09/2021= 17.50 sec with very minimal UE Support. 10/12/2021= 14.90 sec with min BUE Support. 12/05/2021= 14.30 sec with BUE; 01/09/2022= 23.11 sec with min UE on thighs vs. Arm rests for more emphasis on BLE.     Time 12    Period Weeks    Status Ongoing   Target Date 04/03/2022   PT LONG TERM GOAL #7 Title:  Pt will increase 6MWT by at least 105m105m4ft79f order to demonstrate clinically significant improvement in cardiopulmonary endurance and community ambulation                                                       Baseline: 10/12/2021= 710 feet; 12/05/2021= 827 feet; 01/09/2022= 620 feet without an AD - stopping at 4 min mark due to self reported fatigue.  Goal status: ONGOING Target date: 04/03/2022        PT LONG TERM GOAL #8  Title Patient will demonstrate an improved Berg Balance Score of > 50/56 as to demonstrate improved balance with ADLs such as sitting/standing and transfer balance and reduced fall risk.  Baseline 01/09/2022= 47/56  Time 12   Period Weeks   Status INITIAL  Target Date 04/03/2022        Plan -      Clinical Impression Statement Patient responded well again despite some stomach issues. He was able to increase his resistance with therex with  some obvious fatigue yet able to complete reps today. He stated he could really feel his legs working today and states pleased that he is able to now stand without UE support during session as until last week - he has been unable.  Pt will continue to  benefit from skilled physical therapy intervention to address impairments, improve QOL, and attain therapy goals.     Personal Factors and Comorbidities Comorbidity 3+    Comorbidities DM, Liver transplant, ESRD, CAD, feeding tube    Examination-Activity Limitations Carry;Lift;Reach Overhead;Squat;Stairs;Stand;Transfers;Toileting    Examination-Participation Restrictions Cleaning;Community Activity;Driving;Medication Management;Meal Prep;Yard Work    [employer] Evolving/Moderate complexity    Rehab Potential Fair    PT Frequency 2x / week    PT Duration 12 weeks    PT Treatment/Interventions ADLs/Self Care Home Management;Cryotherapy;Moist Heat;DME Instruction;Gait training;Stair training;Functional mobility training;Therapeutic activities;Therapeutic exercise;Balance training;Neuromuscular re-education;Patient/family education;Wheelchair mobility training;Manual techniques;Passive range of motion;Dry needling;Energy conservation;Joint Manipulations    PT Next Visit Plan Progressive Therapeutic exercises, Balance training, transfer/gait training    PT Home Exercise Plan No changes or updates today.    Consulted and Agree with Plan of Care Patient              Lewis Moccasin PT  02/13/2022, 2:51 PM

## 2022-02-15 ENCOUNTER — Ambulatory Visit: Payer: Medicare Other

## 2022-02-15 DIAGNOSIS — R262 Difficulty in walking, not elsewhere classified: Secondary | ICD-10-CM | POA: Diagnosis not present

## 2022-02-15 DIAGNOSIS — R278 Other lack of coordination: Secondary | ICD-10-CM

## 2022-02-15 DIAGNOSIS — R2689 Other abnormalities of gait and mobility: Secondary | ICD-10-CM

## 2022-02-15 DIAGNOSIS — R2681 Unsteadiness on feet: Secondary | ICD-10-CM

## 2022-02-15 DIAGNOSIS — M6281 Muscle weakness (generalized): Secondary | ICD-10-CM

## 2022-02-15 DIAGNOSIS — R269 Unspecified abnormalities of gait and mobility: Secondary | ICD-10-CM

## 2022-02-15 NOTE — Therapy (Signed)
OUTPATIENT PHYSICAL THERAPY TREATMENT NOTE    Patient Name: Alex Hampton MRN: [id number] DOB:Nov 20, 1955, 66 y.o., male Today's Date: 02/16/2022  PCP: Dr. Juluis Pitch REFERRING PROVIDER: Eulis Canner, MD   PT End of Session - 02/15/22 1351     Visit Number 7    Number of Visits 90    Date for PT Re-Evaluation 04/03/22    Authorization Type Medicare/BCBS; PN 03/23/21    Authorization Time Period Initial Cert= 67/34/1937- 90/24/0973; Recert 5/32/9924-2/68/3419: Recert 09/29/2977- 11/16/2117: Recert 07/10/7406- 1/44/8185; Recert 63/04/4968- 26/37/8588    Progress Note Due on Visit 13    PT Start Time 1348    PT Stop Time 1429    PT Time Calculation (min) 41 min    Equipment Utilized During Treatment Gait belt    Activity Tolerance Patient tolerated treatment well    Behavior During Therapy WFL for tasks assessed/performed                           Past Medical History:  Diagnosis Date   Depression    History of cardiac cath    History of heart artery stent    Hyperlipidemia    Hypertension    MI (myocardial infarction) ([place])    Renal disorder    Past Surgical History:  Procedure Laterality Date   IR [place] TUBE CHANGE  11/22/2020   IR [place] TUBE CHANGE  11/26/2020   IR [place] GASTRO/COLONIC TUBE PERCUT W/FLUORO  10/14/2020   LEFT HEART CATH AND CORONARY ANGIOGRAPHY N/A 08/19/2020   Procedure: LEFT HEART CATH AND CORONARY ANGIOGRAPHY;  Surgeon: Corey Skains, MD;  Location: [facility];  Service: Cardiovascular;  Laterality: N/A;   LIVER TRANSPLANT     Patient Active Problem List   Diagnosis Date Noted   Pulmonary embolism ([place]) 08/08/2021   CAP (community acquired pneumonia) 08/08/2021   Protein calorie malnutrition ([place]) 08/08/2021   Hypokalemia 08/08/2021   Pancreatic cyst    Verbal auditory hallucination    Early satiety    Feeding tube dysfunction    Generalized weakness 11/26/2020   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[place])  11/26/2020   Recurrent falls 11/26/2020   Syncope 10/15/2020   Uremia 10/10/2020   ESRD on hemodialysis ([place]) 10/10/2020   Hyperkalemia 10/10/2020   Elevated troponin 10/10/2020   GERD (gastroesophageal reflux disease) 10/10/2020   CAD (coronary artery disease) 10/10/2020   G tube feedings ([place]) 10/10/2020   Diarrhea 10/10/2020   Diabetes mellitus ([place]) 10/10/2020   Weakness    ACS (acute coronary syndrome) ([place])    Liver transplant recipient [facility])    Anemia of chronic disease    Type 2 diabetes mellitus with diabetic neuropathy, with long-term current use of insulin ([place])    ESRD (end stage renal disease) ([place]) 08/19/2020   History of anemia due to chronic kidney disease 08/19/2020   Gait abnormality 10/17/2019   History of fall within past 90 days 10/17/2019   Visual hallucination 10/17/2019   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 10/17/2019   HTN (hypertension) 10/17/2019   History of MI (myocardial infarction) 10/17/2019   OSA (obstructive sleep apnea) 10/17/2019   Nondisplaced comminuted supracondylar fracture without intercondylar fracture of left humerus, subsequent encounter for fracture with nonunion 10/17/2019   Anxiety and depression 10/17/2019    REFERRING DIAG: Other malaise Z94.4 (ICD-10-CM) - Liver transplant status R26.9 (ICD-10-CM) - Unspecified abnormalities of gait and mobility   THERAPY DIAG:  Difficulty in  walking, not elsewhere classified  Abnormality of gait and mobility  Muscle weakness (generalized)  Other abnormalities of gait and mobility  Other lack of coordination  Unsteadiness on feet  Rationale for Evaluation and Treatment Rehabilitation  PERTINENT HISTORY: Patient is a 66 year old male with recent referral for abnomality of gait and recent liver transplant in November 2021. He has past medical history significant for Liver transplant (02/18/2020), CKD, End stage renal disease- On hemodialyis T/TH/Sat., Anemia, DM with neuropathy,  Non-stemi, Coronary artery disease, Feeding tube with poor appetite, PE now on eliquis   PRECAUTIONS: none  SUBJECTIVE: Patient reports feeling a little run down but agreeable to do his best and up for some resistive training.    PAIN:  Are you having pain? No     TODAY'S TREATMENT:  02/15/2022      Therex: Nustep (interval training- LE only at seat 11)  1 min at Level 1 and 30 sec at level 3 - repeat for min  Total distance= 0.17 mi for 5 min total   Seated LAQ with cable - 1 set of 10 reps at 2.5# and 2nd set at 7.5# Seated ham curl with cable system 7.5# BE 2 sets x 10 reps  In // bars:  Forward step over 4 x 1/79fams in bars without UE support and use of 3lb AW- down and back x 3.   Side- step over 4 x 1/216fms in bars without UE support and use of 3lb AW- down and back x 6. Much improved clearance and no LOB.   Balloon tap against mirror with varying foot position: 20-45 sec:  1) feet apart on airex pad 2) feet together on airex pad 3) fee staggered- Increased sway with 1-2 LOB requiring UE reaching  Ambulation with 3lb AW- 330 feet - short reciprocal steps - No LOB- Patient reported limited by fatigue.       Education provided throughout session via VC/TC and demonstration to facilitate movement at target joints and correct muscle activation for all testing and exercises performed.        PATIENT EDUCATION: Education details: Exercise technique, progress towards remainder goals Person educated: Patient Education method: Explanation, Demonstration, Tactile cues, and Verbal cues Education comprehension: verbalized understanding, returned demonstration, verbal cues required, tactile cues required, and needs further education   HOME EXERCISE PROGRAM: No updates   PT Short Term Goals -      PT SHORT TERM GOAL #1   Title Pt will be independent with HEP in order to improve strength and balance in order to decrease fall risk and improve function at home  and work.    Baseline 02/02/2021: [facility]; 1/20/2023Patient states no questions regarding his current exercise regimen and states he is compliant.    Time 6    Period Weeks    Status Achieved    Target Date 03/16/21              PT Long Term Goals -       PT LONG TERM GOAL #1   Title Pt will improve FOTO to target score of 50 to display perceived improvements in ability to complete ADL's.    Baseline 02/02/2021= 43 12/143: 51%; 04/29/2021= 53%; 09/09/2021= 53; 10/12/2021=54%   Time 12    Period Weeks    Status Achieved      PT LONG TERM GOAL #2   Title Pt will decrease 5TSTS by at least 8  seconds in order to demonstrate clinically significant improvement in LE strength.    Baseline 02/02/2021= 28.22 sec with BUE Support 12/14: 23.1 seconds; 04/29/2021=Unable to test today due to patient became nauseous with 6 min walk test and attempted TUG- unable to continue- will reassess next 1-2 visits; 06/08/2021= 25.1 sec with min BUE support; 07/20/2021= 17.75 with min BUE support    Time 12    Period Weeks    Status Achieved    Target Date 07/22/21      PT LONG TERM GOAL #3   Title Pt will decrease TUG to below 19 seconds/decrease in order to demonstrate decreased fall risk.    Baseline 02/02/2021= 27.30 sec without AD 12/14: 23 seconds; 04/29/2021- Unable to assess as patient became very nauseated upon standing and request to stop treatment. 06/08/2021= 19.10 sec without an AD; 07/20/2021= 15.68 sec without UE Support    Time 12    Period Weeks    Status Achieved    Target Date 07/22/21      PT LONG TERM GOAL #4   Title Pt will increase 6MWT by at least 85m(1677f in order to demonstrate clinically significant improvement in cardiopulmonary endurance and community ambulation    Baseline 02/02/2021= 83 feet in 1 min 10 sec wihtout an AD 12/14: 200 ft in  29m87mtes; 04/29/2021=Patient ambulated approx 60 feet yet had to stop due  onset of nausea and unable to complete today; 07/15/2021= 330 feet in 2 min 25 sec without AD.  09/09/2021= 350 feet in 3 min 45 sec. 10/12/2021= 710 feet in 6 min- no AD (Short stand rest break)    Time 12    Period Weeks    Status Achieved   Target Date 07/22/21      PT LONG TERM GOAL #5   Title Pt will increase 10MWT by at least 0.2 m/s in order to demonstrate clinically significant improvement in community ambulation.    Baseline 02/02/2021= 0.42 m/s 12/14: 0.49 m/s; 04/29/2021=Patient unable to test today secondary to being nauseated. 06/08/2021= 0.62 m/s; 07/20/2021= 0.87 m/s    Time 12    Period Weeks    Status Achieved    Target Date 07/22/21      PT LONG TERM GOAL #6   Title Pt will decrease 5TSTS by at least 3 seconds in order to demonstrate clinically significant improvement in LE strength.    Baseline 07/20/2021= 17.75 sec with Minimal BUE Support; 09/09/2021= 17.50 sec with very minimal UE Support. 10/12/2021= 14.90 sec with min BUE Support. 12/05/2021= 14.30 sec with BUE; 01/09/2022= 23.11 sec with min UE on thighs vs. Arm rests for more emphasis on BLE.     Time 12    Period Weeks    Status Ongoing   Target Date 04/03/2022   PT LONG TERM GOAL #7 Title:  Pt will increase 6MWT by at least 88m27m4ft43f order to demonstrate clinically significant improvement in cardiopulmonary endurance and community ambulation                                                       Baseline: 10/12/2021= 710 feet; 12/05/2021= 827 feet; 01/09/2022= 620 feet without an AD - stopping at 4 min mark due to self reported fatigue.  Goal status: ONGOING Target date: 04/03/2022        PT  LONG TERM GOAL #8  Title Patient will demonstrate an improved Berg Balance Score of > 50/56 as to demonstrate improved balance with ADLs such as sitting/standing and transfer balance and reduced fall risk.  Baseline 01/09/2022= 47/56  Time 12   Period Weeks   Status INITIAL  Target Date 04/03/2022        Plan -      Clinical  Impression Statement Patient performed well despite feeling run down. He was able to complete all therex, balance, and ambulation with fatigue as limiting factor yet overall decreased rest breaks and improving functional performance- walking further, standing longer, etc..Marland KitchenPt will continue to benefit from skilled physical therapy intervention to address impairments, improve QOL, and attain therapy goals.     Personal Factors and Comorbidities Comorbidity 3+    Comorbidities DM, Liver transplant, ESRD, CAD, feeding tube    Examination-Activity Limitations Carry;Lift;Reach Overhead;Squat;Stairs;Stand;Transfers;Toileting    Examination-Participation Restrictions Cleaning;Community Activity;Driving;Medication Management;Meal Prep;Yard Work    [employer] officer Evolving/Moderate complexity    Rehab Potential Fair    PT Frequency 2x / week    PT Duration 12 weeks    PT Treatment/Interventions ADLs/Self Care Home Management;Cryotherapy;Moist Heat;DME Instruction;Gait training;Stair training;Functional mobility training;Therapeutic activities;Therapeutic exercise;Balance training;Neuromuscular re-education;Patient/family education;Wheelchair mobility training;Manual techniques;Passive range of motion;Dry needling;Energy conservation;Joint Manipulations    PT Next Visit Plan Progressive Therapeutic exercises, Balance training, transfer/gait training    PT Home Exercise Plan No changes or updates today.    Consulted and Agree with Plan of Care Patient              Lewis Moccasin PT  02/16/2022, 10:47 AM

## 2022-02-20 ENCOUNTER — Ambulatory Visit: Payer: Medicare Other

## 2022-02-20 DIAGNOSIS — R2689 Other abnormalities of gait and mobility: Secondary | ICD-10-CM

## 2022-02-20 DIAGNOSIS — R2681 Unsteadiness on feet: Secondary | ICD-10-CM

## 2022-02-20 DIAGNOSIS — R262 Difficulty in walking, not elsewhere classified: Secondary | ICD-10-CM | POA: Diagnosis not present

## 2022-02-20 DIAGNOSIS — R278 Other lack of coordination: Secondary | ICD-10-CM

## 2022-02-20 DIAGNOSIS — M6281 Muscle weakness (generalized): Secondary | ICD-10-CM

## 2022-02-20 DIAGNOSIS — R269 Unspecified abnormalities of gait and mobility: Secondary | ICD-10-CM

## 2022-02-20 NOTE — Therapy (Signed)
OUTPATIENT PHYSICAL THERAPY TREATMENT NOTE    Patient Name: Alex Hampton MRN: [id number] DOB:1955/07/08, 66 y.o., male Today's Date: 02/21/2022  PCP: Dr. Juluis Pitch REFERRING PROVIDER: Eulis Canner, MD   PT End of Session - 02/20/22 1350     Visit Number 74    Number of Visits 10    Date for PT Re-Evaluation 04/03/22    Authorization Type Medicare/BCBS; PN 03/23/21    Authorization Time Period Initial Cert= 12/87/8676- 72/12/4707; Recert 10/05/3660-9/47/6546: Recert 08/11/5463- 09/15/1273: Recert 04/17/15- 4/94/4967; Recert 59/04/6382- 66/59/9357    Progress Note Due on Visit 80    PT Start Time 1347    PT Stop Time 1422    PT Time Calculation (min) 35 min    Equipment Utilized During Treatment Gait belt    Activity Tolerance Patient tolerated treatment well    Behavior During Therapy WFL for tasks assessed/performed                            Past Medical History:  Diagnosis Date   Depression    History of cardiac cath    History of heart artery stent    Hyperlipidemia    Hypertension    MI (myocardial infarction) ([place])    Renal disorder    Past Surgical History:  Procedure Laterality Date   IR [place] TUBE CHANGE  11/22/2020   IR [place] TUBE CHANGE  11/26/2020   IR [place] GASTRO/COLONIC TUBE PERCUT W/FLUORO  10/14/2020   LEFT HEART CATH AND CORONARY ANGIOGRAPHY N/A 08/19/2020   Procedure: LEFT HEART CATH AND CORONARY ANGIOGRAPHY;  Surgeon: Corey Skains, MD;  Location: [facility];  Service: Cardiovascular;  Laterality: N/A;   LIVER TRANSPLANT     Patient Active Problem List   Diagnosis Date Noted   Pulmonary embolism ([place]) 08/08/2021   CAP (community acquired pneumonia) 08/08/2021   Protein calorie malnutrition ([place]) 08/08/2021   Hypokalemia 08/08/2021   Pancreatic cyst    Verbal auditory hallucination    Early satiety    Feeding tube dysfunction    Generalized weakness 11/26/2020   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[place])  11/26/2020   Recurrent falls 11/26/2020   Syncope 10/15/2020   Uremia 10/10/2020   ESRD on hemodialysis ([place]) 10/10/2020   Hyperkalemia 10/10/2020   Elevated troponin 10/10/2020   GERD (gastroesophageal reflux disease) 10/10/2020   CAD (coronary artery disease) 10/10/2020   G tube feedings ([place]) 10/10/2020   Diarrhea 10/10/2020   Diabetes mellitus ([place]) 10/10/2020   Weakness    ACS (acute coronary syndrome) ([place])    Liver transplant recipient [facility])    Anemia of chronic disease    Type 2 diabetes mellitus with diabetic neuropathy, with long-term current use of insulin ([place])    ESRD (end stage renal disease) ([place]) 08/19/2020   History of anemia due to chronic kidney disease 08/19/2020   Gait abnormality 10/17/2019   History of fall within past 90 days 10/17/2019   Visual hallucination 10/17/2019   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 CT 10/17/2019   HTN (hypertension) 10/17/2019   History of MI (myocardial infarction) 10/17/2019   OSA (obstructive sleep apnea) 10/17/2019   Nondisplaced comminuted supracondylar fracture without intercondylar fracture of left humerus, subsequent encounter for fracture with nonunion 10/17/2019   Anxiety and depression 10/17/2019    REFERRING DIAG: Other malaise Z94.4 (ICD-10-CM) - Liver transplant status R26.9 (ICD-10-CM) - Unspecified abnormalities of gait and mobility   THERAPY DIAG:  Difficulty  in walking, not elsewhere classified  Abnormality of gait and mobility  Muscle weakness (generalized)  Other abnormalities of gait and mobility  Other lack of coordination  Unsteadiness on feet  Rationale for Evaluation and Treatment Rehabilitation  PERTINENT HISTORY: Patient is a 66 year old male with recent referral for abnomality of gait and recent liver transplant in November 2021. He has past medical history significant for Liver transplant (02/18/2020), CKD, End stage renal disease- On hemodialyis T/TH/Sat., Anemia, DM with neuropathy,  Non-stemi, Coronary artery disease, Feeding tube with poor appetite, PE now on eliquis   PRECAUTIONS: none  SUBJECTIVE: Patient reports feeling good with no significant complaints.    PAIN:  Are you having pain? No     TODAY'S TREATMENT:  02/20/2022      Therex:  Standing Mini lunge squat in // bars with 3# AW- forward down and reverse lunge on way back x 2 trials Standing high knee walk in // bars with 3# AW- retro gait with VC for increased step length on back back x 2 trials Tandem gait forward and retro in // bars with 3# AW - down and back x 2 trials Side step in // bars with 3# AW (requiring 4 step from one end of bars to next)  Toe walk in // bars with 3# AW (forward on way down and retro on way back) x 2  Heel walk in // bars with 3# AW (Forward on way down and retro on way back) x 2  Ambulation in // bars (swinging LE in and around 1/2 spike balls positioned in // bars on left/right side) down and back x 3 Ambulation in clinic without an AD and using 3# AWB LE x 450 feet without rest breaks.  Ambulation in // bars (instruction to step over 1/2 spike balls positioned in // bars on left/right side) down and back x 3 Step up onto 2 in block with 1 LE and swing opp LE up/over block then back up/over to start position- Each LE x 12 reps           Education provided throughout session via VC/TC and demonstration to facilitate movement at target joints and correct muscle activation for all testing and exercises performed.        PATIENT EDUCATION: Education details: Exercise technique, progress towards remainder goals Person educated: Patient Education method: Explanation, Demonstration, Tactile cues, and Verbal cues Education comprehension: verbalized understanding, returned demonstration, verbal cues required, tactile cues required, and needs further education   HOME EXERCISE PROGRAM: No updates   PT Short Term Goals -      PT SHORT TERM GOAL #1    Title Pt will be independent with HEP in order to improve strength and balance in order to decrease fall risk and improve function at home and work.    Baseline 02/02/2021: Patient reports limited HEP in place [facility] 12/14 compliance; 1/20/2023Patient states no questions regarding his current exercise regimen and states he is compliant.    Time 6    Period Weeks    Status Achieved    Target Date 03/16/21              PT Long Term Goals -       PT LONG TERM GOAL #1   Title Pt will improve FOTO to target score of 50 to display perceived improvements in ability to complete ADL's.    Baseline 02/02/2021= 43 12/143: 51%; 04/29/2021= 53%; 09/09/2021= 53; 10/12/2021=54%  Time 12    Period Weeks    Status Achieved      PT LONG TERM GOAL #2   Title Pt will decrease 5TSTS by at least 8 seconds in order to demonstrate clinically significant improvement in LE strength.    Baseline 02/02/2021= 28.22 sec with BUE Support 12/14: 23.1 seconds; 04/29/2021=Unable to test today due to patient became nauseous with 6 min walk test and attempted TUG- unable to continue- will reassess next 1-2 visits; 06/08/2021= 25.1 sec with min BUE support; 07/20/2021= 17.75 with min BUE support    Time 12    Period Weeks    Status Achieved    Target Date 07/22/21      PT LONG TERM GOAL #3   Title Pt will decrease TUG to below 19 seconds/decrease in order to demonstrate decreased fall risk.    Baseline 02/02/2021= 27.30 sec without AD 12/14: 23 seconds; 04/29/2021- Unable to assess as patient became very nauseated upon standing and request to stop treatment. 06/08/2021= 19.10 sec without an AD; 07/20/2021= 15.68 sec without UE Support    Time 12    Period Weeks    Status Achieved    Target Date 07/22/21      PT LONG TERM GOAL #4   Title Pt will increase 6MWT by at least 47m(1654f in order to demonstrate clinically significant improvement in cardiopulmonary endurance and community ambulation    Baseline  02/02/2021= 83 feet in 1 min 10 sec wihtout an AD 12/14: 200 ft in  65m46mtes; 04/29/2021=Patient ambulated approx 60 feet yet had to stop due onset of nausea and unable to complete today; 07/15/2021= 330 feet in 2 min 25 sec without AD.  09/09/2021= 350 feet in 3 min 45 sec. 10/12/2021= 710 feet in 6 min- no AD (Short stand rest break)    Time 12    Period Weeks    Status Achieved   Target Date 07/22/21      PT LONG TERM GOAL #5   Title Pt will increase 10MWT by at least 0.2 m/s in order to demonstrate clinically significant improvement in community ambulation.    Baseline 02/02/2021= 0.42 m/s 12/14: 0.49 m/s; 04/29/2021=Patient unable to test today secondary to being nauseated. 06/08/2021= 0.62 m/s; 07/20/2021= 0.87 m/s    Time 12    Period Weeks    Status Achieved    Target Date 07/22/21      PT LONG TERM GOAL #6   Title Pt will decrease 5TSTS by at least 3 seconds in order to demonstrate clinically significant improvement in LE strength.    Baseline 07/20/2021= 17.75 sec with Minimal BUE Support; 09/09/2021= 17.50 sec with very minimal UE Support. 10/12/2021= 14.90 sec with min BUE Support. 12/05/2021= 14.30 sec with BUE; 01/09/2022= 23.11 sec with min UE on thighs vs. Arm rests for more emphasis on BLE.     Time 12    Period Weeks    Status Ongoing   Target Date 04/03/2022   PT LONG TERM GOAL #7 Title:  Pt will increase 6MWT by at least 49m21m4ft68f order to demonstrate clinically significant improvement in cardiopulmonary endurance and community ambulation                                                       Baseline: 10/12/2021= 710 feet; 12/05/2021= 827  feet; 01/09/2022= 620 feet without an AD - stopping at 4 min mark due to self reported fatigue.  Goal status: ONGOING Target date: 04/03/2022        PT LONG TERM GOAL #8  Title Patient will demonstrate an improved Berg Balance Score of > 50/56 as to demonstrate improved balance with ADLs such as sitting/standing and transfer balance and reduced  fall risk.  Baseline 01/09/2022= 47/56  Time 12   Period Weeks   Status INITIAL  Target Date 04/03/2022        Plan -      Clinical Impression Statement Patient presented with good motivation and no complaints of stomach issues today. He continues to progress with standing functional abilities with less overall rest breaks. At this time he continues to be limited by weakness, impaired balance, and fatigue. He is now ambulating 3 laps along with increased standing performance. Still unsteady with tandem or SLS activities. Pt will continue to benefit from skilled physical therapy intervention to address impairments, improve QOL, and attain therapy goals.     Personal Factors and Comorbidities Comorbidity 3+    Comorbidities DM, Liver transplant, ESRD, CAD, feeding tube    Examination-Activity Limitations Carry;Lift;Reach Overhead;Squat;Stairs;Stand;Transfers;Toileting    Examination-Participation Restrictions Cleaning;Community Activity;Driving;Medication Management;Meal Prep;Yard Work    [employer] officer Evolving/Moderate complexity    Rehab Potential Fair    PT Frequency 2x / week    PT Duration 12 weeks    PT Treatment/Interventions ADLs/Self Care Home Management;Cryotherapy;Moist Heat;DME Instruction;Gait training;Stair training;Functional mobility training;Therapeutic activities;Therapeutic exercise;Balance training;Neuromuscular re-education;Patient/family education;Wheelchair mobility training;Manual techniques;Passive range of motion;Dry needling;Energy conservation;Joint Manipulations    PT Next Visit Plan Progressive Therapeutic exercises, Balance training, transfer/gait training    PT Home Exercise Plan No changes or updates today.    Consulted and Agree with Plan of Care Patient              Lewis Moccasin PT  02/21/2022, 9:33 AM

## 2022-02-22 ENCOUNTER — Ambulatory Visit: Payer: Medicare Other

## 2022-02-27 ENCOUNTER — Ambulatory Visit: Payer: Medicare Other

## 2022-02-28 NOTE — Therapy (Incomplete)
OUTPATIENT PHYSICAL THERAPY TREATMENT NOTE    Patient Name: Alex Hampton MRN: [id number] DOB:[birth year], 66 y.o., male Today's Date: 03/01/2022  PCP: Dr. Juluis Pitch REFERRING PROVIDER: Eulis Canner, MD                    Past Medical History:  Diagnosis Date   Depression    History of cardiac cath    History of heart artery stent    Hyperlipidemia    Hypertension    MI (myocardial infarction) [facility])    Renal disorder    Past Surgical History:  Procedure Laterality Date   IR [place] TUBE CHANGE  11/22/2020   IR [place] TUBE CHANGE  11/26/2020   IR [place] GASTRO/COLONIC TUBE PERCUT W/FLUORO  10/14/2020   LEFT HEART CATH AND CORONARY ANGIOGRAPHY N/A 08/19/2020   Procedure: LEFT HEART CATH AND CORONARY ANGIOGRAPHY;  Surgeon: Corey Skains, MD;  Location: [place] [facility];  Service: Cardiovascular;  Laterality: N/A;   LIVER TRANSPLANT     Patient Active Problem List   Diagnosis Date Noted   Pulmonary embolism ([place]) 08/08/2021   CAP (community acquired pneumonia) 08/08/2021   Protein calorie malnutrition ([place]) 08/08/2021   Hypokalemia 08/08/2021   Pancreatic cyst    Verbal auditory hallucination    Early satiety    Feeding tube dysfunction    Generalized weakness 11/26/2020   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[place]) 11/26/2020   Recurrent falls 11/26/2020   Syncope 10/15/2020   Uremia 10/10/2020   ESRD on hemodialysis ([place]) 10/10/2020   Hyperkalemia 10/10/2020   Elevated troponin 10/10/2020   GERD (gastroesophageal reflux disease) 10/10/2020   CAD (coronary artery disease) 10/10/2020   G tube feedings ([place]) 10/10/2020   Diarrhea 10/10/2020   Diabetes mellitus ([place]) 10/10/2020   Weakness    ACS (acute coronary syndrome) ([place])    Liver transplant recipient [facility])    Anemia of chronic disease    Type 2 diabetes mellitus with diabetic neuropathy, with long-term current use of insulin ([place])    ESRD (end stage renal disease) ([place]) 08/19/2020    History of anemia due to chronic kidney disease 08/19/2020   Gait abnormality 10/17/2019   History of fall within past 90 days 10/17/2019   Visual hallucination 10/17/2019   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  Rationale for Evaluation and Treatment Rehabilitation  PERTINENT HISTORY: Patient is a 66 year old male with recent referral for abnomality of gait and recent liver transplant in November 2021. He has past medical history significant for Liver transplant (02/18/2020), CKD, End stage renal disease- On hemodialyis T/TH/Sat., Anemia, DM with neuropathy, Non-stemi, Coronary artery disease, Feeding tube with poor appetite, PE now on eliquis   PRECAUTIONS: none  SUBJECTIVE:   PAIN:  Are you having pain? No     TODAY'S TREATMENT:  03/01/2022      Therex: Seated LAQ 3# AW Seated hip flex Seated hamstring curl with BTB Sit to stand Standing calf raises Standing side step up/over orange hurdle  Tandem gait forward and retro in //  bars with 3# AW - down and back x 2 trials Side step in // bars with 3# AW (requiring 4 step from one end of bars to next)  Ambulation in clinic without an AD and using 3# AWB LE x 450 feet without rest breaks.             Education provided throughout session via VC/TC and demonstration to facilitate movement at target joints and correct muscle activation for all testing and exercises performed.        PATIENT  EDUCATION: Education details: Exercise technique, progress towards remainder goals Person educated: Patient Education method: Explanation, Demonstration, Tactile cues, and Verbal cues Education comprehension: verbalized understanding, returned demonstration, verbal cues required, tactile cues required, and needs further education   HOME EXERCISE PROGRAM: No updates   PT Short Term Goals -      PT SHORT TERM GOAL #1   Title Pt will be independent with HEP in order to improve strength and balance in order to decrease fall risk and improve function at home and work.    Baseline 02/02/2021: Patient reports limited HEP in place currently from [facility] agency 12/14 compliance; 1/20/2023Patient states no questions regarding his current exercise regimen and states he is compliant.    Time 6    Period Weeks    Status Achieved    Target Date 03/16/21              PT Long Term Goals -       PT LONG TERM GOAL #1   Title Pt will improve FOTO to target score of 50 to display perceived improvements in ability to complete ADL's.    Baseline 02/02/2021= 43 12/143: 51%; 04/29/2021= 53%; 09/09/2021= 53; 10/12/2021=54%   Time 12    Period Weeks    Status Achieved      PT LONG TERM GOAL #2   Title Pt will decrease 5TSTS by at least 8 seconds in order to demonstrate clinically significant improvement in LE strength.    Baseline 02/02/2021= 28.22 sec with BUE Support 12/14: 23.1 seconds; 04/29/2021=Unable to test today due to patient became nauseous with 6 min walk test and attempted TUG- unable to continue- will reassess next 1-2 visits; 06/08/2021= 25.1 sec with min BUE support; 07/20/2021= 17.75 with min BUE support    Time 12    Period Weeks    Status Achieved    Target Date 07/22/21      PT LONG TERM GOAL #3   Title Pt will decrease TUG to below 19 seconds/decrease in order to demonstrate decreased fall risk.    Baseline 02/02/2021= 27.30 sec without AD 12/14: 23 seconds; 04/29/2021- Unable to assess as  patient became very nauseated upon standing and request to stop treatment. 06/08/2021= 19.10 sec without an AD; 07/20/2021= 15.68 sec without UE Support    Time 12    Period Weeks    Status Achieved    Target Date 07/22/21      PT LONG TERM GOAL #4   Title Pt will increase 6MWT by at least 95m(1681f in order to demonstrate clinically significant improvement in cardiopulmonary endurance and community ambulation    Baseline 02/02/2021= 83 feet in 1 min 10 sec wihtout an AD 12/14: 200 ft in  55m38mtes; 04/29/2021=Patient ambulated approx 60 feet yet had to stop due onset of nausea and unable to complete today; 07/15/2021= 330 feet in 2 min 25 sec without AD.  09/09/2021= 350 feet in 3 min 45 sec. 10/12/2021= 710  feet in 6 min- no AD (Short stand rest break)    Time 12    Period Weeks    Status Achieved   Target Date 07/22/21      PT LONG TERM GOAL #5   Title Pt will increase 10MWT by at least 0.2 m/s in order to demonstrate clinically significant improvement in community ambulation.    Baseline 02/02/2021= 0.42 m/s 12/14: 0.49 m/s; 04/29/2021=Patient unable to test today secondary to being nauseated. 06/08/2021= 0.62 m/s; 07/20/2021= 0.87 m/s    Time 12    Period Weeks    Status Achieved    Target Date 07/22/21      PT LONG TERM GOAL #6   Title Pt will decrease 5TSTS by at least 3 seconds in order to demonstrate clinically significant improvement in LE strength.    Baseline 07/20/2021= 17.75 sec with Minimal BUE Support; 09/09/2021= 17.50 sec with very minimal UE Support. 10/12/2021= 14.90 sec with min BUE Support. 12/05/2021= 14.30 sec with BUE; 01/09/2022= 23.11 sec with min UE on thighs vs. Arm rests for more emphasis on BLE.     Time 12    Period Weeks    Status Ongoing   Target Date 04/03/2022   PT LONG TERM GOAL #7 Title:  Pt will increase 6MWT by at least 2m(1626f in order to demonstrate clinically significant improvement in cardiopulmonary endurance and community ambulation                                                        Baseline: 10/12/2021= 710 feet; 12/05/2021= 827 feet; 01/09/2022= 620 feet without an AD - stopping at 4 min mark due to self reported fatigue.  Goal status: ONGOING Target date: 04/03/2022        PT LONG TERM GOAL #8  Title Patient will demonstrate an improved Berg Balance Score of > 50/56 as to demonstrate improved balance with ADLs such as sitting/standing and transfer balance and reduced fall risk.  Baseline 01/09/2022= 47/56  Time 12   Period Weeks   Status INITIAL  Target Date 04/03/2022        Plan -      Clinical Impression Statement Patient presented with good motivation and no complaints of stomach issues today. He continues to progress with standing functional abilities with less overall rest breaks. At this time he continues to be limited by weakness, impaired balance, and fatigue. He is now ambulating 3 laps along with increased standing performance. Still unsteady with tandem or SLS activities. Pt will continue to benefit from skilled physical therapy intervention to address impairments, improve QOL, and attain therapy goals.     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 [employer] officervolving/Moderate complexity    Rehab Potential Fair    PT Frequency 2x / week    PT Duration 12 weeks    PT Treatment/Interventions ADLs/Self Care Home Management;Cryotherapy;Moist Heat;DME Instruction;Gait training;Stair training;Functional mobility training;Therapeutic activities;Therapeutic exercise;Balance training;Neuromuscular re-education;Patient/family education;Wheelchair mobility training;Manual techniques;Passive range of motion;Dry  needling;Energy conservation;Joint Manipulations    PT Next Visit Plan Progressive Therapeutic exercises, Balance training, transfer/gait training    PT Home Exercise Plan No changes or  updates today.    Consulted and Agree with Plan of Care Patient              Lewis Moccasin PT  03/01/2022, 8:26 AM

## 2022-03-01 ENCOUNTER — Ambulatory Visit: Payer: Medicare Other

## 2022-03-01 ENCOUNTER — Telehealth: Payer: Self-pay

## 2022-03-01 NOTE — Telephone Encounter (Signed)
Phone call to patient at 14:11 to check on his status as he missed both Physical Therapy visits this week. Left Voicemail requesting patient to call back. Ollen Bowl, PT

## 2022-03-06 ENCOUNTER — Ambulatory Visit: Payer: Medicare Other

## 2022-03-06 DIAGNOSIS — R278 Other lack of coordination: Secondary | ICD-10-CM

## 2022-03-06 DIAGNOSIS — R2689 Other abnormalities of gait and mobility: Secondary | ICD-10-CM

## 2022-03-06 DIAGNOSIS — R269 Unspecified abnormalities of gait and mobility: Secondary | ICD-10-CM

## 2022-03-06 DIAGNOSIS — R2681 Unsteadiness on feet: Secondary | ICD-10-CM

## 2022-03-06 DIAGNOSIS — R262 Difficulty in walking, not elsewhere classified: Secondary | ICD-10-CM

## 2022-03-06 DIAGNOSIS — M6281 Muscle weakness (generalized): Secondary | ICD-10-CM

## 2022-03-06 DIAGNOSIS — R296 Repeated falls: Secondary | ICD-10-CM

## 2022-03-06 NOTE — Therapy (Unsigned)
OUTPATIENT PHYSICAL THERAPY TREATMENT NOTE    Patient Name: Alex Hampton MRN: [id number] DOB:1955/10/09, 66 y.o., male Today's Date: 03/07/2022  PCP: Dr. Juluis Pitch REFERRING PROVIDER: Eulis Canner, MD   PT End of Session - 03/07/22 0844     Visit Number 50    Number of Visits 38    Date for PT Re-Evaluation 04/03/22    Authorization Type Medicare/BCBS; PN 03/23/21    Authorization Time Period Initial Cert= 41/74/0814- 48/18/5631; Recert 4/97/0263-7/85/8850: Recert 2/77/4128- 10/15/6765: Recert 2/0/9470- 9/62/8366; Recert 29/07/7652- 65/06/5463    Progress Note Due on Visit 16    PT Start Time 1345    PT Stop Time 1425    PT Time Calculation (min) 40 min    Equipment Utilized During Treatment Gait belt    Activity Tolerance Patient tolerated treatment well    Behavior During Therapy WFL for tasks assessed/performed                             Past Medical History:  Diagnosis Date   Depression    History of cardiac cath    History of heart artery stent    Hyperlipidemia    Hypertension    MI (myocardial infarction) ([place])    Renal disorder    Past Surgical History:  Procedure Laterality Date   IR [place] TUBE CHANGE  11/22/2020   IR [place] TUBE CHANGE  11/26/2020   IR [place] GASTRO/COLONIC TUBE PERCUT W/FLUORO  10/14/2020   LEFT HEART CATH AND CORONARY ANGIOGRAPHY N/A 08/19/2020   Procedure: LEFT HEART CATH AND CORONARY ANGIOGRAPHY;  Surgeon: Corey Skains, MD;  Location: [place] [facility];  Service: Cardiovascular;  Laterality: N/A;   LIVER TRANSPLANT     Patient Active Problem List   Diagnosis Date Noted   Pulmonary embolism ([place]) 08/08/2021   CAP (community acquired pneumonia) 08/08/2021   Protein calorie malnutrition ([place]) 08/08/2021   Hypokalemia 08/08/2021   Pancreatic cyst    Verbal auditory hallucination    Early satiety    Feeding tube dysfunction    Generalized weakness 11/26/2020   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[place])  11/26/2020   Recurrent falls 11/26/2020   Syncope 10/15/2020   Uremia 10/10/2020   ESRD on hemodialysis ([place]) 10/10/2020   Hyperkalemia 10/10/2020   Elevated troponin 10/10/2020   GERD (gastroesophageal reflux disease) 10/10/2020   CAD (coronary artery disease) 10/10/2020   G tube feedings ([place]) 10/10/2020   Diarrhea 10/10/2020   Diabetes mellitus ([place]) 10/10/2020   Weakness    ACS (acute coronary syndrome) ([place])    Liver transplant recipient [facility])    Anemia of chronic disease    Type 2 diabetes mellitus with diabetic neuropathy, with long-term current use of insulin ([place])    ESRD (end stage renal disease) ([place]) 08/19/2020   History of anemia due to chronic kidney disease 08/19/2020   Gait abnormality 10/17/2019   History of fall within past 90 days 10/17/2019   Visual hallucination 10/17/2019   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 CT 10/17/2019   HTN (hypertension) 10/17/2019   History of MI (myocardial infarction) 10/17/2019   OSA (obstructive sleep apnea) 10/17/2019   Nondisplaced comminuted supracondylar fracture without intercondylar fracture of left humerus, subsequent encounter for fracture with nonunion 10/17/2019   Anxiety and depression 10/17/2019    REFERRING DIAG: Other malaise Z94.4 (ICD-10-CM) - Liver transplant status R26.9 (ICD-10-CM) - Unspecified abnormalities of gait and mobility   THERAPY DIAG:  Difficulty in walking, not elsewhere classified  Abnormality of gait and mobility  Muscle weakness (generalized)  Other abnormalities of gait and mobility  Other lack of coordination  Unsteadiness on feet  Repeated falls  Rationale for Evaluation and Treatment Rehabilitation  PERTINENT HISTORY: Patient is a 66 year old male with recent referral for abnomality of gait and recent liver transplant in November 2021. He has past medical history significant for Liver transplant (02/18/2020), CKD, End stage renal disease- On hemodialyis T/TH/Sat., Anemia, DM  with neuropathy, Non-stemi, Coronary artery disease, Feeding tube with poor appetite, PE now on eliquis   PRECAUTIONS: none  SUBJECTIVE: Patient reports feeling good and states missed last week due to change in days with dialysis.    PAIN:  Are you having pain? No     TODAY'S TREATMENT:  03/06/2022   NMR:  Forward/retro tandem walking in // bars with 4# AW  - down and back x 5. VC for increased step length backward Step tap onto 6" step without UE support x 12 reps alt LE with 4# AW ( no LOB)  Static stand (front foot on 6" block and other foot on ground) - dynamic trunk/head twist left to right x 10 reps each side (mild imbalance yet no LOB) Tandem step/walk on 2x4 board in // bars then retro steps backward with 4# Incline board- hold 30 sec with eyes open then 30 sec eyes closed then 10 reps horizontal head turns Decline board- hold 30 sec with eyes open then 30 sec eyes closed then 10 reps horizontal head turns Single leg stance- patient able to hold between 2-7 sec each LE x multiple 10+ attempts.        Therex:   Ambulation in clinic without an AD and using 4# AWB LE x 180 feet - Patient fatigued at end of session.         Education provided throughout session via VC/TC and demonstration to facilitate movement at target joints and correct muscle activation for all testing and exercises performed.        PATIENT EDUCATION: Education details: Exercise technique, progress towards remainder goals Person educated: Patient Education method: Explanation, Demonstration, Tactile cues, and Verbal cues Education comprehension: verbalized understanding, returned demonstration, verbal cues required, tactile cues required, and needs further education   HOME EXERCISE PROGRAM: No updates   PT Short Term Goals -      PT SHORT TERM GOAL #1   Title Pt will be independent with HEP in order to improve strength and balance in order to decrease fall risk and improve  function at home and work.    Baseline 02/02/2021: Patient reports limited HEP in place currently from [facility] agency 12/14 compliance; 1/20/2023Patient states no questions regarding his current exercise regimen and states he is compliant.    Time 6    Period Weeks    Status Achieved    Target Date 03/16/21              PT Long Term Goals -       PT LONG TERM GOAL #1   Title Pt will improve FOTO to target score of 50 to display perceived improvements in ability to complete ADL's.    Baseline 02/02/2021= 43 12/143: 51%; 04/29/2021= 53%; 09/09/2021= 53; 10/12/2021=54%   Time 12    Period Weeks    Status Achieved      PT LONG TERM GOAL #2   Title Pt will decrease 5TSTS by at least 8 seconds in order  to demonstrate clinically significant improvement in LE strength.    Baseline 02/02/2021= 28.22 sec with BUE Support 12/14: 23.1 seconds; 04/29/2021=Unable to test today due to patient became nauseous with 6 min walk test and attempted TUG- unable to continue- will reassess next 1-2 visits; 06/08/2021= 25.1 sec with min BUE support; 07/20/2021= 17.75 with min BUE support    Time 12    Period Weeks    Status Achieved    Target Date 07/22/21      PT LONG TERM GOAL #3   Title Pt will decrease TUG to below 19 seconds/decrease in order to demonstrate decreased fall risk.    Baseline 02/02/2021= 27.30 sec without AD 12/14: 23 seconds; 04/29/2021- Unable to assess as patient became very nauseated upon standing and request to stop treatment. 06/08/2021= 19.10 sec without an AD; 07/20/2021= 15.68 sec without UE Support    Time 12    Period Weeks    Status Achieved    Target Date 07/22/21      PT LONG TERM GOAL #4   Title Pt will increase 6MWT by at least 64m(1677f in order to demonstrate clinically significant improvement in cardiopulmonary endurance and community ambulation    Baseline 02/02/2021= 83 feet in 1 min 10 sec wihtout an AD 12/14: 200 ft in  66m79mtes; 04/29/2021=Patient ambulated approx 60 feet  yet had to stop due onset of nausea and unable to complete today; 07/15/2021= 330 feet in 2 min 25 sec without AD.  09/09/2021= 350 feet in 3 min 45 sec. 10/12/2021= 710 feet in 6 min- no AD (Short stand rest break)    Time 12    Period Weeks    Status Achieved   Target Date 07/22/21      PT LONG TERM GOAL #5   Title Pt will increase 10MWT by at least 0.2 m/s in order to demonstrate clinically significant improvement in community ambulation.    Baseline 02/02/2021= 0.42 m/s 12/14: 0.49 m/s; 04/29/2021=Patient unable to test today secondary to being nauseated. 06/08/2021= 0.62 m/s; 07/20/2021= 0.87 m/s    Time 12    Period Weeks    Status Achieved    Target Date 07/22/21      PT LONG TERM GOAL #6   Title Pt will decrease 5TSTS by at least 3 seconds in order to demonstrate clinically significant improvement in LE strength.    Baseline 07/20/2021= 17.75 sec with Minimal BUE Support; 09/09/2021= 17.50 sec with very minimal UE Support. 10/12/2021= 14.90 sec with min BUE Support. 12/05/2021= 14.30 sec with BUE; 01/09/2022= 23.11 sec with min UE on thighs vs. Arm rests for more emphasis on BLE.     Time 12    Period Weeks    Status Ongoing   Target Date 04/03/2022   PT LONG TERM GOAL #7 Title:  Pt will increase 6MWT by at least 82m31m4ft72f order to demonstrate clinically significant improvement in cardiopulmonary endurance and community ambulation                                                       Baseline: 10/12/2021= 710 feet; 12/05/2021= 827 feet; 01/09/2022= 620 feet without an AD - stopping at 4 min mark due to self reported fatigue.  Goal status: ONGOING Target date: 04/03/2022        PT LONG TERM GOAL #  8  Title Patient will demonstrate an improved Berg Balance Score of > 50/56 as to demonstrate improved balance with ADLs such as sitting/standing and transfer balance and reduced fall risk.  Baseline 01/09/2022= 47/56  Time 12   Period Weeks   Status INITIAL  Target Date 04/03/2022        Plan  -      Clinical Impression Statement Treatment focused mostly on dynamic standing balance. Patient presents with improving endurance and able to progress through variety of balance activities ranging in difficulty with most difficulty with single leg balance. Pt will continue to benefit from skilled physical therapy intervention to address impairments, improve QOL, and attain therapy goals.     Personal Factors and Comorbidities Comorbidity 3+    Comorbidities DM, Liver transplant, ESRD, CAD, feeding tube    Examination-Activity Limitations Carry;Lift;Reach Overhead;Squat;Stairs;Stand;Transfers;Toileting    Examination-Participation Restrictions Cleaning;Community Activity;Driving;Medication Management;Meal Prep;Yard Work    Merchant navy officer Evolving/Moderate complexity    Rehab Potential Fair    PT Frequency 2x / week    PT Duration 12 weeks    PT Treatment/Interventions ADLs/Self Care Home Management;Cryotherapy;Moist Heat;DME Instruction;Gait training;Stair training;Functional mobility training;Therapeutic activities;Therapeutic exercise;Balance training;Neuromuscular re-education;Patient/family education;Wheelchair mobility training;Manual techniques;Passive range of motion;Dry needling;Energy conservation;Joint Manipulations    PT Next Visit Plan Progressive Therapeutic exercises, Balance training, transfer/gait training    PT Home Exercise Plan No changes or updates today.    Consulted and Agree with Plan of Care Patient              Lewis Moccasin PT  03/07/2022, 11:14 AM

## 2022-03-08 ENCOUNTER — Ambulatory Visit: Payer: Medicare Other

## 2022-03-08 NOTE — Therapy (Incomplete)
OUTPATIENT PHYSICAL THERAPY TREATMENT NOTE    Patient Name: Alex Hampton MRN: [id number] DOB:28-Sep-1955, 66 y.o., male Today's Date: 03/08/2022  PCP: Dr. Juluis Pitch REFERRING PROVIDER: Eulis Canner, MD                     Past Medical History:  Diagnosis Date   Depression    History of cardiac cath    History of heart artery stent    Hyperlipidemia    Hypertension    MI (myocardial infarction) [facility])    Renal disorder    Past Surgical History:  Procedure Laterality Date   IR [place] TUBE CHANGE  11/22/2020   IR [place] TUBE CHANGE  11/26/2020   IR [place] GASTRO/COLONIC TUBE PERCUT W/FLUORO  10/14/2020   LEFT HEART CATH AND CORONARY ANGIOGRAPHY N/A 08/19/2020   Procedure: LEFT HEART CATH AND CORONARY ANGIOGRAPHY;  Surgeon: Corey Skains, MD;  Location: [facility];  Service: Cardiovascular;  Laterality: N/A;   LIVER TRANSPLANT     Patient Active Problem List   Diagnosis Date Noted   Pulmonary embolism ([place]) 08/08/2021   CAP (community acquired pneumonia) 08/08/2021   Protein calorie malnutrition ([place]) 08/08/2021   Hypokalemia 08/08/2021   Pancreatic cyst    Verbal auditory hallucination    Early satiety    Feeding tube dysfunction    Generalized weakness 11/26/2020   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[place]) 11/26/2020   Recurrent falls 11/26/2020   Syncope 10/15/2020   Uremia 10/10/2020   ESRD on hemodialysis ([place]) 10/10/2020   Hyperkalemia 10/10/2020   Elevated troponin 10/10/2020   GERD (gastroesophageal reflux disease) 10/10/2020   CAD (coronary artery disease) 10/10/2020   G tube feedings ([place]) 10/10/2020   Diarrhea 10/10/2020   Diabetes mellitus ([place]) 10/10/2020   Weakness    ACS (acute coronary syndrome) ([place])    Liver transplant recipient [facility])    Anemia of chronic disease    Type 2 diabetes mellitus with diabetic neuropathy, with long-term current use of insulin ([place])    ESRD (end stage renal disease) ([place]) 08/19/2020    History of anemia due to chronic kidney disease 08/19/2020   Gait abnormality 10/17/2019   History of fall within past 90 days 10/17/2019   Visual hallucination 10/17/2019   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  Rationale for Evaluation and Treatment Rehabilitation  PERTINENT HISTORY: Patient is a 66 year old male with recent referral for abnomality of gait and recent liver transplant in November 2021. He has past medical history significant for Liver transplant (02/18/2020), CKD, End stage renal disease- On hemodialyis T/TH/Sat., Anemia, DM with neuropathy, Non-stemi, Coronary artery disease, Feeding tube with poor appetite, PE now on eliquis   PRECAUTIONS: none  SUBJECTIVE: Patient reports feeling good and states missed last week due to change in days with dialysis.    PAIN:  Are you having pain? No     TODAY'S TREATMENT:  03/06/2022   NMR:  Forward/retro tandem walking in // bars with 4# AW  - down and back x  5. VC for increased step length backward Step tap onto 6" step without UE support x 12 reps alt LE with 4# AW ( no LOB)  Static stand (front foot on 6" block and other foot on ground) - dynamic trunk/head twist left to right x 10 reps each side (mild imbalance yet no LOB) Tandem step/walk on 2x4 board in // bars then retro steps backward with 4# Incline board- hold 30 sec with eyes open then 30 sec eyes closed then 10 reps horizontal head turns Decline  board- hold 30 sec with eyes open then 30 sec eyes closed then 10 reps horizontal head turns Single leg stance- patient able to hold between 2-7 sec each LE x multiple 10+ attempts.        Therex:   Ambulation in clinic without an AD and using 4# AWB LE x 180 feet - Patient fatigued at end of session.         Education provided throughout session via VC/TC and demonstration to facilitate movement at target joints and correct muscle activation for all testing and exercises performed.        PATIENT EDUCATION: Education details: Exercise technique, progress towards remainder goals Person educated: Patient Education method: Explanation, Demonstration, Tactile cues, and Verbal cues Education comprehension: verbalized understanding, returned demonstration, verbal cues required, tactile cues required, and needs further education   HOME EXERCISE PROGRAM: No updates   PT Short Term Goals -      PT SHORT TERM GOAL #1   Title Pt will be independent with HEP in order to improve strength and balance in order to decrease fall risk and improve function at home and work.    Baseline 02/02/2021: Patient reports limited HEP in place currently from [facility] agency 12/14 compliance; 1/20/2023Patient states no questions regarding his current exercise regimen and states he is compliant.    Time 6    Period Weeks    Status Achieved    Target Date 03/16/21              PT Long Term Goals -       PT LONG TERM GOAL #1   Title Pt will improve FOTO to target score of 50 to display perceived improvements in ability to complete ADL's.    Baseline 02/02/2021= 43 12/143: 51%; 04/29/2021= 53%; 09/09/2021= 53; 10/12/2021=54%   Time 12    Period Weeks    Status Achieved      PT LONG TERM GOAL #2   Title Pt will decrease 5TSTS by at least 8 seconds in order to demonstrate clinically significant improvement in LE strength.    Baseline 02/02/2021= 28.22 sec with BUE Support 12/14: 23.1 seconds;  04/29/2021=Unable to test today due to patient became nauseous with 6 min walk test and attempted TUG- unable to continue- will reassess next 1-2 visits; 06/08/2021= 25.1 sec with min BUE support; 07/20/2021= 17.75 with min BUE support    Time 12    Period Weeks    Status Achieved    Target Date 07/22/21      PT LONG TERM GOAL #3   Title Pt will decrease TUG to below 19 seconds/decrease in order to demonstrate decreased fall risk.    Baseline 02/02/2021= 27.30 sec without AD 12/14: 23 seconds; 04/29/2021- Unable to assess as patient became very nauseated upon standing and request to stop treatment. 06/08/2021= 19.10 sec without an AD; 07/20/2021= 15.68 sec without UE Support    Time 12    Period Weeks  Status Achieved    Target Date 07/22/21      PT LONG TERM GOAL #4   Title Pt will increase 6MWT by at least 79m(1669f in order to demonstrate clinically significant improvement in cardiopulmonary endurance and community ambulation    Baseline 02/02/2021= 83 feet in 1 min 10 sec wihtout an AD 12/14: 200 ft in  88m24mtes; 04/29/2021=Patient ambulated approx 60 feet yet had to stop due onset of nausea and unable to complete today; 07/15/2021= 330 feet in 2 min 25 sec without AD.  09/09/2021= 350 feet in 3 min 45 sec. 10/12/2021= 710 feet in 6 min- no AD (Short stand rest break)    Time 12    Period Weeks    Status Achieved   Target Date 07/22/21      PT LONG TERM GOAL #5   Title Pt will increase 10MWT by at least 0.2 m/s in order to demonstrate clinically significant improvement in community ambulation.    Baseline 02/02/2021= 0.42 m/s 12/14: 0.49 m/s; 04/29/2021=Patient unable to test today secondary to being nauseated. 06/08/2021= 0.62 m/s; 07/20/2021= 0.87 m/s    Time 12    Period Weeks    Status Achieved    Target Date 07/22/21      PT LONG TERM GOAL #6   Title Pt will decrease 5TSTS by at least 3 seconds in order to demonstrate clinically significant improvement in LE strength.    Baseline 07/20/2021=  17.75 sec with Minimal BUE Support; 09/09/2021= 17.50 sec with very minimal UE Support. 10/12/2021= 14.90 sec with min BUE Support. 12/05/2021= 14.30 sec with BUE; 01/09/2022= 23.11 sec with min UE on thighs vs. Arm rests for more emphasis on BLE.     Time 12    Period Weeks    Status Ongoing   Target Date 04/03/2022   PT LONG TERM GOAL #7 Title:  Pt will increase 6MWT by at least 62m51m4ft27f order to demonstrate clinically significant improvement in cardiopulmonary endurance and community ambulation                                                       Baseline: 10/12/2021= 710 feet; 12/05/2021= 827 feet; 01/09/2022= 620 feet without an AD - stopping at 4 min mark due to self reported fatigue.  Goal status: ONGOING Target date: 04/03/2022        PT LONG TERM GOAL #8  Title Patient will demonstrate an improved Berg Balance Score of > 50/56 as to demonstrate improved balance with ADLs such as sitting/standing and transfer balance and reduced fall risk.  Baseline 01/09/2022= 47/56  Time 12   Period Weeks   Status INITIAL  Target Date 04/03/2022        Plan -      Clinical Impression Statement Treatment focused mostly on dynamic standing balance. Patient presents with improving endurance and able to progress through variety of balance activities ranging in difficulty with most difficulty with single leg balance. Pt will continue to benefit from skilled physical therapy intervention to address impairments, improve QOL, and attain therapy goals.     Personal Factors and Comorbidities Comorbidity 3+    Comorbidities DM, Liver transplant, ESRD, CAD, feeding tube    Examination-Activity Limitations Carry;Lift;Reach Overhead;Squat;Stairs;Stand;Transfers;Toileting    Examination-Participation Restrictions Cleaning;Community Activity;Driving;Medication Management;Meal Prep;Yard Work    [employer]chant [employer] officer  Evolving/Moderate complexity    Rehab Potential Fair    PT Frequency 2x / week     PT Duration 12 weeks    PT Treatment/Interventions ADLs/Self Care Home Management;Cryotherapy;Moist Heat;DME Instruction;Gait training;Stair training;Functional mobility training;Therapeutic activities;Therapeutic exercise;Balance training;Neuromuscular re-education;Patient/family education;Wheelchair mobility training;Manual techniques;Passive range of motion;Dry needling;Energy conservation;Joint Manipulations    PT Next Visit Plan Progressive Therapeutic exercises, Balance training, transfer/gait training    PT Home Exercise Plan No changes or updates today.    Consulted and Agree with Plan of Care Patient              Lewis Moccasin PT  03/08/2022, 9:53 AM

## 2022-03-13 ENCOUNTER — Ambulatory Visit: Payer: Medicare Other | Attending: Gastroenterology

## 2022-03-13 DIAGNOSIS — R269 Unspecified abnormalities of gait and mobility: Secondary | ICD-10-CM | POA: Diagnosis present

## 2022-03-13 DIAGNOSIS — R278 Other lack of coordination: Secondary | ICD-10-CM | POA: Insufficient documentation

## 2022-03-13 DIAGNOSIS — R2681 Unsteadiness on feet: Secondary | ICD-10-CM | POA: Insufficient documentation

## 2022-03-13 DIAGNOSIS — R296 Repeated falls: Secondary | ICD-10-CM | POA: Insufficient documentation

## 2022-03-13 DIAGNOSIS — R262 Difficulty in walking, not elsewhere classified: Secondary | ICD-10-CM | POA: Diagnosis present

## 2022-03-13 DIAGNOSIS — R2689 Other abnormalities of gait and mobility: Secondary | ICD-10-CM | POA: Insufficient documentation

## 2022-03-13 DIAGNOSIS — M6281 Muscle weakness (generalized): Secondary | ICD-10-CM | POA: Insufficient documentation

## 2022-03-13 NOTE — Therapy (Signed)
OUTPATIENT PHYSICAL THERAPY TREATMENT NOTE    Patient Name: Alex Hampton MRN: [id number] DOB:[birth year], 66 y.o., male Today's Date: 03/13/2022  PCP: Dr. Juluis Pitch REFERRING PROVIDER: Eulis Canner, MD   PT End of Session - 03/13/22 1348     Visit Number 52    Number of Visits 47    Date for PT Re-Evaluation 04/03/22    Authorization Type Medicare/BCBS; PN 03/23/21    Authorization Time Period Initial Cert= 73/42/8768- 11/57/2620; Recert 3/55/9741-6/38/4536: Recert 4/68/0321- 05/13/4823: Recert 0/0/3704- 8/88/9169; Recert 45/0/3888- 28/00/3491    Progress Note Due on Visit 80    PT Start Time 1345    Equipment Utilized During Treatment Gait belt    Activity Tolerance Patient tolerated treatment well    Behavior During Therapy [facility] for tasks assessed/performed                             Past Medical History:  Diagnosis Date   Depression    History of cardiac cath    History of heart artery stent    Hyperlipidemia    Hypertension    MI (myocardial infarction) ([place])    Renal disorder    Past Surgical History:  Procedure Laterality Date   IR [place] TUBE CHANGE  11/22/2020   IR [place] TUBE CHANGE  11/26/2020   IR [place] GASTRO/COLONIC TUBE PERCUT W/FLUORO  10/14/2020   LEFT HEART CATH AND CORONARY ANGIOGRAPHY N/A 08/19/2020   Procedure: LEFT HEART CATH AND CORONARY ANGIOGRAPHY;  Surgeon: Corey Skains, MD;  Location: [facility];  Service: Cardiovascular;  Laterality: N/A;   LIVER TRANSPLANT     Patient Active Problem List   Diagnosis Date Noted   Pulmonary embolism ([place]) 08/08/2021   CAP (community acquired pneumonia) 08/08/2021   Protein calorie malnutrition ([place]) 08/08/2021   Hypokalemia 08/08/2021   Pancreatic cyst    Verbal auditory hallucination    Early satiety    Feeding tube dysfunction    Generalized weakness 11/26/2020   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[place]) 11/26/2020   Recurrent falls 11/26/2020   Syncope  10/15/2020   Uremia 10/10/2020   ESRD on hemodialysis ([place]) 10/10/2020   Hyperkalemia 10/10/2020   Elevated troponin 10/10/2020   GERD (gastroesophageal reflux disease) 10/10/2020   CAD (coronary artery disease) 10/10/2020   G tube feedings ([place]) 10/10/2020   Diarrhea 10/10/2020   Diabetes mellitus ([place]) 10/10/2020   Weakness    ACS (acute coronary syndrome) ([place])    Liver transplant recipient [facility])    Anemia of chronic disease    Type 2 diabetes mellitus with diabetic neuropathy, with long-term current use of insulin ([place])    ESRD (end stage renal disease) ([place]) 08/19/2020   History of anemia due to chronic kidney disease 08/19/2020   Gait abnormality 10/17/2019   History of fall within past 90 days 10/17/2019   Visual hallucination 10/17/2019   Cirrhosis of liver with ascites ([place]) on CT 10/17/2019   HTN (hypertension) 10/17/2019   History of MI (myocardial infarction) 10/17/2019   OSA (obstructive sleep apnea) 10/17/2019   Nondisplaced comminuted supracondylar fracture without intercondylar fracture of left humerus, subsequent encounter for fracture with nonunion 10/17/2019   Anxiety and depression 10/17/2019    REFERRING DIAG: Other malaise Z94.4 (ICD-10-CM) - Liver transplant status R26.9 (ICD-10-CM) - Unspecified abnormalities of gait and mobility   THERAPY DIAG:  Abnormality of gait and mobility  Difficulty in walking, not elsewhere classified  Muscle weakness (generalized)  Other abnormalities of gait and mobility  Other lack of coordination  Unsteadiness on feet  Rationale for Evaluation and Treatment Rehabilitation  PERTINENT HISTORY: Patient is a 66 year old male with recent referral for abnomality of gait and recent liver transplant in November 2021. He has past medical history significant for Liver transplant (02/18/2020), CKD, End stage renal disease- On hemodialyis T/TH/Sat., Anemia, DM with neuropathy, Non-stemi, Coronary artery disease, Feeding tube with  poor appetite, PE now on eliquis   PRECAUTIONS: none  SUBJECTIVE: Patient reports having to cancel last visit secondary to not feeling well. States doing better overall today.    PAIN:  Are you having pain? No     TODAY'S TREATMENT:  03/13/2022    NMR:   Standing on BOSU (Curve side up) - hold 30 sec x 3 - Mostly ankle/hip right reaction and only 2-3 episodes of reaching for UE support Standing on BOSU (Curve side up) - hold 30 sec x 3  Forward/retro  walking in clinic with cones positioned for increased step length (left and right side)   - down and back x 5. VC for increased step length backward Single leg stance- patient able to hold between 2-12 sec each LE x multiple 10+ attempts.        Therex:   Ambulation in clinic without an AD 300 feet in 1 min 30 sec. Ambulation later in session approx 350 feet in 1 min 54 sec  (last portion - performed horizontal head turns (calling out items on sticky notes)   Large step tap 12" block- x 10 reps   Forward step lunge (stationary leg flexed on top of 12 in block) x 10 reps each LE.   Sit to stand x 10 without UE support (no rest break)    Education provided throughout session via VC/TC and demonstration to facilitate movement at target joints and correct muscle activation for all testing and exercises performed.        PATIENT EDUCATION: Education details: Exercise technique, progress towards remainder goals Person educated: Patient Education method: Explanation, Demonstration, Tactile cues, and Verbal cues Education comprehension: verbalized understanding, returned demonstration, verbal cues required, tactile cues required, and needs further education   HOME EXERCISE PROGRAM: No updates   PT Short Term Goals -      PT SHORT TERM GOAL #1   Title Pt will be independent with HEP in order to improve strength and balance in order to decrease fall risk and improve function at home and work.    Baseline 02/02/2021:  Patient reports limited HEP in place currently from [facility] agency 12/14 compliance; 1/20/2023Patient states no questions regarding his current exercise regimen and states he is compliant.    Time 6    Period Weeks    Status Achieved    Target Date 03/16/21              PT Long Term Goals -       PT LONG TERM GOAL #1   Title Pt will improve FOTO to target score of 50 to display perceived improvements in ability to complete ADL's.    Baseline 02/02/2021= 43 12/143: 51%; 04/29/2021= 53%; 09/09/2021= 53; 10/12/2021=54%   Time 12    Period Weeks    Status Achieved      PT LONG TERM GOAL #2   Title Pt will decrease 5TSTS by at least 8 seconds in order to demonstrate clinically significant improvement in LE strength.    Baseline 02/02/2021= 28.22 sec with  BUE Support 12/14: 23.1 seconds; 04/29/2021=Unable to test today due to patient became nauseous with 6 min walk test and attempted TUG- unable to continue- will reassess next 1-2 visits; 06/08/2021= 25.1 sec with min BUE support; 07/20/2021= 17.75 with min BUE support    Time 12    Period Weeks    Status Achieved    Target Date 07/22/21      PT LONG TERM GOAL #3   Title Pt will decrease TUG to below 19 seconds/decrease in order to demonstrate decreased fall risk.    Baseline 02/02/2021= 27.30 sec without AD 12/14: 23 seconds; 04/29/2021- Unable to assess as patient became very nauseated upon standing and request to stop treatment. 06/08/2021= 19.10 sec without an AD; 07/20/2021= 15.68 sec without UE Support    Time 12    Period Weeks    Status Achieved    Target Date 07/22/21      PT LONG TERM GOAL #4   Title Pt will increase 6MWT by at least 492m(1651f in order to demonstrate clinically significant improvement in cardiopulmonary endurance and community ambulation    Baseline 02/02/2021= 83 feet in 1 min 10 sec wihtout an AD 12/14: 200 ft in  92m25mtes; 04/29/2021=Patient ambulated approx 60 feet yet had to stop due onset of nausea and unable to  complete today; 07/15/2021= 330 feet in 2 min 25 sec without AD.  09/09/2021= 350 feet in 3 min 45 sec. 10/12/2021= 710 feet in 6 min- no AD (Short stand rest break)    Time 12    Period Weeks    Status Achieved   Target Date 07/22/21      PT LONG TERM GOAL #5   Title Pt will increase 10MWT by at least 0.2 m/s in order to demonstrate clinically significant improvement in community ambulation.    Baseline 02/02/2021= 0.42 m/s 12/14: 0.49 m/s; 04/29/2021=Patient unable to test today secondary to being nauseated. 06/08/2021= 0.62 m/s; 07/20/2021= 0.87 m/s    Time 12    Period Weeks    Status Achieved    Target Date 07/22/21      PT LONG TERM GOAL #6   Title Pt will decrease 5TSTS by at least 3 seconds in order to demonstrate clinically significant improvement in LE strength.    Baseline 07/20/2021= 17.75 sec with Minimal BUE Support; 09/09/2021= 17.50 sec with very minimal UE Support. 10/12/2021= 14.90 sec with min BUE Support. 12/05/2021= 14.30 sec with BUE; 01/09/2022= 23.11 sec with min UE on thighs vs. Arm rests for more emphasis on BLE.     Time 12    Period Weeks    Status Ongoing   Target Date 04/03/2022   PT LONG TERM GOAL #7 Title:  Pt will increase 6MWT by at least 59m71m4ft9f order to demonstrate clinically significant improvement in cardiopulmonary endurance and community ambulation                                                       Baseline: 10/12/2021= 710 feet; 12/05/2021= 827 feet; 01/09/2022= 620 feet without an AD - stopping at 4 min mark due to self reported fatigue.  Goal status: ONGOING Target date: 04/03/2022        PT LONG TERM GOAL #8  Title Patient will demonstrate an improved Berg Balance Score of > 50/56 as to  demonstrate improved balance with ADLs such as sitting/standing and transfer balance and reduced fall risk.  Baseline 01/09/2022= 47/56  Time 12   Period Weeks   Status INITIAL  Target Date 04/03/2022        Plan -      Clinical Impression Statement Patient  demo progress overall with increased single leg stance time. He is walking well with less overall fatigue and only challenged with retro steps and horizontal head turning- mild dizziness noted at end of visit but overall performing well with all tasks. Pt will continue to benefit from skilled physical therapy intervention to address impairments, improve QOL, and attain therapy goals.     Personal Factors and Comorbidities Comorbidity 3+    Comorbidities DM, Liver transplant, ESRD, CAD, feeding tube    Examination-Activity Limitations Carry;Lift;Reach Overhead;Squat;Stairs;Stand;Transfers;Toileting    Examination-Participation Restrictions Cleaning;Community Activity;Driving;Medication Management;Meal Prep;Yard Work    Merchant navy officer Evolving/Moderate complexity    Rehab Potential Fair    PT Frequency 2x / week    PT Duration 12 weeks    PT Treatment/Interventions ADLs/Self Care Home Management;Cryotherapy;Moist Heat;DME Instruction;Gait training;Stair training;Functional mobility training;Therapeutic activities;Therapeutic exercise;Balance training;Neuromuscular re-education;Patient/family education;Wheelchair mobility training;Manual techniques;Passive range of motion;Dry needling;Energy conservation;Joint Manipulations    PT Next Visit Plan Progressive Therapeutic exercises, Balance training, transfer/gait training    PT Home Exercise Plan No changes or updates today.    Consulted and Agree with Plan of Care Patient              Lewis Moccasin PT  03/13/2022, 3:23 PM

## 2022-03-15 ENCOUNTER — Ambulatory Visit: Payer: Medicare Other

## 2022-03-15 ENCOUNTER — Ambulatory Visit (INDEPENDENT_AMBULATORY_CARE_PROVIDER_SITE_OTHER): Payer: Medicare Other | Admitting: Dermatology

## 2022-03-15 VITALS — BP 112/71 | HR 78

## 2022-03-15 DIAGNOSIS — L814 Other melanin hyperpigmentation: Secondary | ICD-10-CM | POA: Diagnosis not present

## 2022-03-15 DIAGNOSIS — R269 Unspecified abnormalities of gait and mobility: Secondary | ICD-10-CM

## 2022-03-15 DIAGNOSIS — R262 Difficulty in walking, not elsewhere classified: Secondary | ICD-10-CM

## 2022-03-15 DIAGNOSIS — M6281 Muscle weakness (generalized): Secondary | ICD-10-CM

## 2022-03-15 DIAGNOSIS — R278 Other lack of coordination: Secondary | ICD-10-CM

## 2022-03-15 DIAGNOSIS — D485 Neoplasm of uncertain behavior of skin: Secondary | ICD-10-CM

## 2022-03-15 DIAGNOSIS — R2689 Other abnormalities of gait and mobility: Secondary | ICD-10-CM

## 2022-03-15 DIAGNOSIS — R2681 Unsteadiness on feet: Secondary | ICD-10-CM

## 2022-03-15 NOTE — Therapy (Signed)
OUTPATIENT PHYSICAL THERAPY TREATMENT NOTE/Physical Therapy Progress Note   Dates of reporting period  01/25/2022   to   03/15/2022    Patient Name: Alex Hampton MRN: [id number] DOB:Mar 07, 1956, 66 y.o., male Today's Date: 03/16/2022  PCP: Dr. Juluis Pitch REFERRING PROVIDER: Eulis Canner, MD   PT End of Session - 03/15/22 1350     Visit Number 44    Number of Visits 35    Date for PT Re-Evaluation 04/03/22    Authorization Type Medicare/BCBS; PN 03/23/21    Authorization Time Period Initial Cert= 23/55/7322- 02/54/2706; Recert 2/37/6283-1/51/7616: Recert 0/73/7106- 05/17/9483: Recert 07/14/2701- 5/00/9381; Recert 82/12/9369- 69/67/8938    Progress Note Due on Visit 46    PT Start Time 1345    PT Stop Time 1424    PT Time Calculation (min) 39 min    Equipment Utilized During Treatment Gait belt    Activity Tolerance Patient tolerated treatment well    Behavior During Therapy WFL for tasks assessed/performed                             Past Medical History:  Diagnosis Date   Depression    History of cardiac cath    History of heart artery stent    Hyperlipidemia    Hypertension    MI (myocardial infarction) ([place])    Renal disorder    Past Surgical History:  Procedure Laterality Date   IR GJ TUBE CHANGE  11/22/2020   IR [place] TUBE CHANGE  11/26/2020   IR Organ GASTRO/COLONIC TUBE PERCUT W/FLUORO  10/14/2020   LEFT HEART CATH AND CORONARY ANGIOGRAPHY N/A 08/19/2020   Procedure: LEFT HEART CATH AND CORONARY ANGIOGRAPHY;  Surgeon: Corey Skains, MD;  Location: [facility];  Service: Cardiovascular;  Laterality: N/A;   LIVER TRANSPLANT     Patient Active Problem List   Diagnosis Date Noted   Pulmonary embolism ([place]) 08/08/2021   CAP (community acquired pneumonia) 08/08/2021   Protein calorie malnutrition ([place]) 08/08/2021   Hypokalemia 08/08/2021   Pancreatic cyst    Verbal auditory hallucination    Early satiety    Feeding  tube dysfunction    Generalized weakness 11/26/2020   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[place]) 11/26/2020   Recurrent falls 11/26/2020   Syncope 10/15/2020   Uremia 10/10/2020   ESRD on hemodialysis ([place]) 10/10/2020   Hyperkalemia 10/10/2020   Elevated troponin 10/10/2020   GERD (gastroesophageal reflux disease) 10/10/2020   CAD (coronary artery disease) 10/10/2020   G tube feedings ([place]) 10/10/2020   Diarrhea 10/10/2020   Diabetes mellitus ([place]) 10/10/2020   Weakness    ACS (acute coronary syndrome) ([place])    Liver transplant recipient [facility])    Anemia of chronic disease    Type 2 diabetes mellitus with diabetic neuropathy, with long-term current use of insulin ([place])    ESRD (end stage renal disease) ([place]) 08/19/2020   History of anemia due to chronic kidney disease 08/19/2020   Gait abnormality 10/17/2019   History of fall within past 90 days 10/17/2019   Visual hallucination 10/17/2019   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 10/17/2019   HTN (hypertension) 10/17/2019   History of MI (myocardial infarction) 10/17/2019   OSA (obstructive sleep apnea) 10/17/2019   Nondisplaced comminuted supracondylar fracture without intercondylar fracture of left humerus, subsequent encounter for fracture with nonunion 10/17/2019   Anxiety and depression 10/17/2019    REFERRING DIAG: Other malaise Z94.4 (ICD-10-CM) -  Liver transplant status R26.9 (ICD-10-CM) - Unspecified abnormalities of gait and mobility   THERAPY DIAG:  Abnormality of gait and mobility  Difficulty in walking, not elsewhere classified  Muscle weakness (generalized)  Other abnormalities of gait and mobility  Other lack of coordination  Unsteadiness on feet  Rationale for Evaluation and Treatment Rehabilitation  PERTINENT HISTORY: Patient is a 66 year old male with recent referral for abnomality of gait and recent liver transplant in November 2021. He has past medical history significant for Liver transplant  (02/18/2020), CKD, End stage renal disease- On hemodialyis T/TH/Sat., Anemia, DM with neuropathy, Non-stemi, Coronary artery disease, Feeding tube with poor appetite, PE now on eliquis   PRECAUTIONS: none  SUBJECTIVE: Patient reports having to cancel last visit secondary to not feeling well. States doing better overall today.    PAIN:  Are you having pain? No     TODAY'S TREATMENT:  03/15/2022  BP=126/74 BP= 115/75 Stand NMR:  Step tap x 10 reps each LE without UE support Obstacle course 1) Step up onto 6" block then onto aired pad- 10 march alt LE then up and over 1/2 foam x2 then outside of // bars stepping across stepper then tandem between 6 positioned cones  Obstacle course 2) Rockerboard - stepping on bottom then on top side til board tilts down - step off then onto airex pad with 10 reps of alt LE march - then up over 1/2 foam roll x 2 (side step)  then outside of // bars up/over stepper then tandem gait through the cones.   Reassessed goals including 5x STS  (17.46 sec) and 6 min walk (1040 feet)        Education provided throughout session via VC/TC and demonstration to facilitate movement at target joints and correct muscle activation for all testing and exercises performed.        PATIENT EDUCATION: Education details: Exercise technique, progress towards remainder goals Person educated: Patient Education method: Explanation, Demonstration, Tactile cues, and Verbal cues Education comprehension: verbalized understanding, returned demonstration, verbal cues required, tactile cues required, and needs further education   HOME EXERCISE PROGRAM: No updates   PT Short Term Goals -      PT SHORT TERM GOAL #1   Title Pt will be independent with HEP in order to improve strength and balance in order to decrease fall risk and improve function at home and work.    Baseline 02/02/2021: Patient reports limited HEP in place currently from [facility] agency 12/14 compliance;  1/20/2023Patient states no questions regarding his current exercise regimen and states he is compliant.    Time 6    Period Weeks    Status Achieved    Target Date 03/16/21              PT Long Term Goals -       PT LONG TERM GOAL #1   Title Pt will improve FOTO to target score of 50 to display perceived improvements in ability to complete ADL's.    Baseline 02/02/2021= 43 12/143: 51%; 04/29/2021= 53%; 09/09/2021= 53; 10/12/2021=54%   Time 12    Period Weeks    Status Achieved      PT LONG TERM GOAL #2   Title Pt will decrease 5TSTS by at least 8 seconds in order to demonstrate clinically significant improvement in LE strength.    Baseline 02/02/2021= 28.22 sec with BUE Support 12/14: 23.1 seconds; 04/29/2021=Unable to test today due to patient became nauseous with 6 min walk  test and attempted TUG- unable to continue- will reassess next 1-2 visits; 06/08/2021= 25.1 sec with min BUE support; 07/20/2021= 17.75 with min BUE support    Time 12    Period Weeks    Status Achieved    Target Date 07/22/21      PT LONG TERM GOAL #3   Title Pt will decrease TUG to below 19 seconds/decrease in order to demonstrate decreased fall risk.    Baseline 02/02/2021= 27.30 sec without AD 12/14: 23 seconds; 04/29/2021- Unable to assess as patient became very nauseated upon standing and request to stop treatment. 06/08/2021= 19.10 sec without an AD; 07/20/2021= 15.68 sec without UE Support    Time 12    Period Weeks    Status Achieved    Target Date 07/22/21      PT LONG TERM GOAL #4   Title Pt will increase 6MWT by at least 85m(1620f in order to demonstrate clinically significant improvement in cardiopulmonary endurance and community ambulation    Baseline 02/02/2021= 83 feet in 1 min 10 sec wihtout an AD 12/14: 200 ft in  46m846mtes; 04/29/2021=Patient ambulated approx 60 feet yet had to stop due onset of nausea and unable to complete today; 07/15/2021= 330 feet in 2 min 25 sec without AD.  09/09/2021= 350 feet  in 3 min 45 sec. 10/12/2021= 710 feet in 6 min- no AD (Short stand rest break)    Time 12    Period Weeks    Status Achieved   Target Date 07/22/21      PT LONG TERM GOAL #5   Title Pt will increase 10MWT by at least 0.2 m/s in order to demonstrate clinically significant improvement in community ambulation.    Baseline 02/02/2021= 0.42 m/s 12/14: 0.49 m/s; 04/29/2021=Patient unable to test today secondary to being nauseated. 06/08/2021= 0.62 m/s; 07/20/2021= 0.87 m/s    Time 12    Period Weeks    Status Achieved    Target Date 07/22/21      PT LONG TERM GOAL #6   Title Pt will decrease 5TSTS by at least 3 seconds in order to demonstrate clinically significant improvement in LE strength.    Baseline 07/20/2021= 17.75 sec with Minimal BUE Support; 09/09/2021= 17.50 sec with very minimal UE Support. 10/12/2021= 14.90 sec with min BUE Support. 12/05/2021= 14.30 sec with BUE; 01/09/2022= 23.11 sec with min UE on thighs vs. Arm rests for more emphasis on BLE.  03/15/2022= 17.46 sec without UE Support.   Time 12    Period Weeks    Status Ongoing   Target Date 04/03/2022   PT LONG TERM GOAL #7 Title:  Pt will increase 6MWT by at least 82m57m4ft21f order to demonstrate clinically significant improvement in cardiopulmonary endurance and community ambulation                                                       Baseline: 10/12/2021= 710 feet; 12/05/2021= 827 feet; 01/09/2022= 620 feet without an AD - stopping at 4 min mark due to self reported fatigue. 03/15/2022= 1040 feet Goal status: ONGOING Target date: 04/03/2022        PT LONG TERM GOAL #8  Title Patient will demonstrate an improved Berg Balance Score of > 50/56 as to demonstrate improved balance with ADLs such as sitting/standing and transfer balance  and reduced fall risk.  Baseline 01/09/2022= 47/56  Time 12   Period Weeks   Status INITIAL  Target Date 04/03/2022        Plan -      Clinical Impression Statement Patient continues to make good  progress as seen today by improved functional endurance with 6 min walk test and improved 5 x STS. He remains motivated by recent progress and his condition has the potential to improve in response to therapy. Maximum improvement is yet to be obtained. The anticipated improvement is attainable and reasonable in a generally predictable time.  Pt will continue to benefit from skilled physical therapy intervention to address impairments, improve QOL, and attain therapy goals.     Personal Factors and Comorbidities Comorbidity 3+    Comorbidities DM, Liver transplant, ESRD, CAD, feeding tube    Examination-Activity Limitations Carry;Lift;Reach Overhead;Squat;Stairs;Stand;Transfers;Toileting    Examination-Participation Restrictions Cleaning;Community Activity;Driving;Medication Management;Meal Prep;Yard Work    Merchant navy officer Evolving/Moderate complexity    Rehab Potential Fair    PT Frequency 2x / week    PT Duration 12 weeks    PT Treatment/Interventions ADLs/Self Care Home Management;Cryotherapy;Moist Heat;DME Instruction;Gait training;Stair training;Functional mobility training;Therapeutic activities;Therapeutic exercise;Balance training;Neuromuscular re-education;Patient/family education;Wheelchair mobility training;Manual techniques;Passive range of motion;Dry needling;Energy conservation;Joint Manipulations    PT Next Visit Plan Progressive Therapeutic exercises, Balance training, transfer/gait training    PT Home Exercise Plan No changes or updates today.    Consulted and Agree with Plan of Care Patient              Lewis Moccasin PT  03/16/2022, 12:55 PM

## 2022-03-15 NOTE — Patient Instructions (Addendum)
Wound Care Instructions  Cleanse wound gently with soap and water once a day then pat dry with clean gauze. Apply a thin coat of Petrolatum (petroleum jelly, "Vaseline") over the wound (unless you have an allergy to this). We recommend that you use a new, sterile tube of Vaseline. Do not pick or remove scabs. Do not remove the yellow or white "healing tissue" from the base of the wound.  Cover the wound with fresh, clean, nonstick gauze and secure with paper tape. You may use Band-Aids in place of gauze and tape if the wound is small enough, but would recommend trimming much of the tape off as there is often too much. Sometimes Band-Aids can irritate the skin.  You should call the office for your biopsy report after 1 week if you have not already been contacted.  If you experience any problems, such as abnormal amounts of bleeding, swelling, significant bruising, significant pain, or evidence of infection, please call the office immediately.  FOR ADULT SURGERY PATIENTS: If you need something for pain relief you may take 1 extra strength Tylenol (acetaminophen) AND 2 Ibuprofen (276m each) together every 4 hours as needed for pain. (do not take these if you are allergic to them or if you have a reason you should not take them.) Typically, you may only need pain medication for 1 to 3 days.    Due to recent changes in healthcare laws, you may see results of your pathology and/or laboratory studies on MyChart before the doctors have had a chance to review them. We understand that in some cases there may be results that are confusing or concerning to you. Please understand that not all results are received at the same time and often the doctors may need to interpret multiple results in order to provide you with the best plan of care or course of treatment. Therefore, we ask that you please give u[place]2 business days to thoroughly review all your results before contacting the office for clarification. Should we  see a critical lab result, you will be contacted sooner.   If You Need Anything After Your Visit  If you have any questions or concerns for your doctor, please call our main line at [phone] press option 4 to reach your doctor's medical assistant. If no one answers, please leave a voicemail as directed and we will return your call as soon as possible. Messages left after 4 pm will be answered the following business day.   You may also send [place] message via [facility] We typically respond to MyChart messages within 1-2 business days.  For prescription refills, please ask your pharmacy to contact our office. Our fax number is [phone]  If you have an urgent issue when the clinic is closed that cannot wait until the next business day, you can page your doctor at the number below.    Please note that while we do our best to be available for urgent issues outside of office hours, we are not available 24/7.   If you have an urgent issue and are unable to reach u[place] you may choose to seek medical care at your doctor's office, retail clinic, urgent care center, or emergency room.  If you have a medical emergency, please immediately call 911 or go to the emergency department.  Pager Numbers  - Dr. KNehemiah Massed [phone] - Dr. MLaurence Ferrari [phone] - Dr. SNicole Kindred [phone] In the event of inclement weather, please call our main line at [phone]  for an update on the status of any delays or closures.  Dermatology Medication Tips: Please keep the boxes that topical medications come in in order to help keep track of the instructions about where and how to use these. Pharmacies typically print the medication instructions only on the boxes and not directly on the medication tubes.   If your medication is too expensive, please contact our office at [phone] option 4 or send [place] a message through [place].   We are unable to tell what your co-pay for medications will be in advance  as this is different depending on your insurance coverage. However, we may be able to find a substitute medication at lower cost or fill out paperwork to get insurance to cover a needed medication.   If a prior authorization is required to get your medication covered by your insurance company, please allow [place] 1-2 business days to complete this process.  Drug prices often vary depending on where the prescription is filled and some pharmacies may offer cheaper prices.  The website www.goodrx.com contains coupons for medications through different pharmacies. The prices here do not account for what the cost may be with help from insurance (it may be cheaper with your insurance), but the website can give you the price if you did not use any insurance.  - You can print the associated coupon and take it with your prescription to the pharmacy.  - You may also stop by our office during regular business hours and pick up a GoodRx coupon card.  - If you need your prescription sent electronically to a different pharmacy, notify our office through [facility] or by phone at [phone] option 4.     Si Usted Necesita Algo Despus de Su Visita  Tambin puede enviarnos un mensaje a travs de Pharmacist, community. Por lo general respondemos a los mensajes de MyChart en el transcurso de 1 a 2 das hbiles.  Para renovar recetas, por favor pida a su farmacia que se ponga en contacto con nuestra oficina. Harland Dingwall de fax es [place] [phone].  Si tiene un asunto urgente cuando la clnica est cerrada y que no puede esperar hasta el siguiente da hbil, puede llamar/localizar a su doctor(a) al nmero que aparece a continuacin.   Por favor, tenga en cuenta que aunque hacemos todo lo posible para estar disponibles para asuntos urgentes fuera del horario de [place], no estamos disponibles las 24 horas del da, los 7 das de la [place].   Si tiene un problema urgente y no puede comunicarse con nosotros, puede optar  por buscar atencin mdica  en el consultorio de su doctor(a), en una clnica privada, en un centro de atencin urgente o en una sala de emergencias.  Si tiene Engineering geologist, por favor llame inmediatamente al 911 o vaya a la sala de emergencias.  Nmeros de bper  - Dr. Nehemiah Massed: [phone]  - Dra. Moye: [phone]  - Dra. Nicole Kindred: [phone]  En caso de inclemencias del [place], por favor llame a Johnsie Kindred principal al [phone] para una actualizacin sobre el [place] de cualquier retraso o cierre.  Consejos para la medicacin en dermatologa: Por favor, guarde las cajas en las que vienen los medicamentos de uso tpico para ayudarle a seguir las instrucciones sobre dnde y cmo usarlos. Las farmacias generalmente imprimen las instrucciones del medicamento slo en las cajas y no directamente en los tubos del [place].   Si su medicamento es [employer], por favor, pngase en contacto con Cleotis Nipper  oficina llamando al [phone] y presione la opcin 4 o envenos un mensaje a travs de Pharmacist, community.   No podemos decirle cul ser su copago por los medicamentos por adelantado ya que esto es diferente dependiendo de la cobertura de su seguro. Sin embargo, es posible que podamos encontrar un medicamento sustituto a Electrical engineer un formulario para que el seguro cubra el medicamento que se considera necesario.   Si se requiere una autorizacin previa para que su compaa de seguros [place] su medicamento, por favor permtanos de 1 a 2 das hbiles para completar este proceso.  Los precios de los medicamentos varan con frecuencia dependiendo del Environmental consultant de dnde se surte la receta y alguna farmacias pueden ofrecer precios ms baratos.  El sitio web www.goodrx.com tiene cupones para medicamentos de Airline pilot. Los precios aqu no tienen en cuenta lo que podra costar con la ayuda del seguro (puede ser ms barato con su seguro), pero el sitio web puede darle el precio si  no utiliz Research scientist (physical sciences).  - Puede imprimir el cupn correspondiente y llevarlo con su receta a la farmacia.  - Tambin puede pasar por nuestra oficina durante el horario de atencin regular y Charity fundraiser una tarjeta de cupones de GoodRx.  - Si necesita que su receta se enve electrnicamente a una farmacia diferente, informe a nuestra oficina a travs de MyChart de [place] o por telfono llamando al [phone] y presione la opcin 4.

## 2022-03-15 NOTE — Progress Notes (Signed)
   Follow-Up Visit   Subjective  Alex Hampton is a 66 y.o. male who presents for the following: Growth (Right preauricular, present x 2 months, bleeds when picks at.). H/o Seb dermatitis.   The following portions of the chart were reviewed this encounter and updated as appropriate:       Review of Systems:  No other skin or systemic complaints except as noted in HPI or Assessment and Plan.  Objective  Well appearing patient in no apparent distress; mood and affect are within normal limits.  A focused examination was performed including face. Relevant physical exam findings are noted in the Assessment and Plan.  Right Preauricular 4.0 mm heme-crusted papule     Assessment & Plan  Neoplasm of uncertain behavior of skin Right Preauricular  Epidermal / dermal shaving  Lesion diameter (cm):  0.4 Informed consent: discussed and consent obtained   Patient was prepped and draped in usual sterile fashion: Area prepped with alcohol. Anesthesia: the lesion was anesthetized in a standard fashion   Anesthetic:  1% lidocaine w/ epinephrine 1-100,000 buffered w/ 8.4% NaHCO3 Instrument used: flexible razor blade   Hemostasis achieved with: pressure, aluminum chloride and electrodesiccation   Outcome: patient tolerated procedure well   Post-procedure details: wound care instructions given   Post-procedure details comment:  Ointment and small bandage applied  Specimen 1 - Surgical pathology Differential Diagnosis: Irritated SK vs Wart vs Skin tag vs Hypertrophic AK  Check Margins: No    Lentigines - Scattered tan macules - Due to sun exposure - Benign-appearing, observe - Recommend daily broad spectrum sunscreen SPF 30+ to sun-exposed areas, reapply every 2 hours as needed. - Call for any changes   Return as scheduled with Dr Dara Lords, Jamesetta Orleans, CMA, am acting as scribe for Brendolyn Patty, MD .  Documentation: I have reviewed the above documentation for accuracy and  completeness, and I agree with the above.  Brendolyn Patty MD

## 2022-03-20 ENCOUNTER — Ambulatory Visit: Payer: Medicare Other

## 2022-03-20 DIAGNOSIS — M6281 Muscle weakness (generalized): Secondary | ICD-10-CM

## 2022-03-20 DIAGNOSIS — R2689 Other abnormalities of gait and mobility: Secondary | ICD-10-CM

## 2022-03-20 DIAGNOSIS — R269 Unspecified abnormalities of gait and mobility: Secondary | ICD-10-CM

## 2022-03-20 DIAGNOSIS — R262 Difficulty in walking, not elsewhere classified: Secondary | ICD-10-CM

## 2022-03-20 DIAGNOSIS — R2681 Unsteadiness on feet: Secondary | ICD-10-CM

## 2022-03-20 DIAGNOSIS — R278 Other lack of coordination: Secondary | ICD-10-CM

## 2022-03-20 DIAGNOSIS — R296 Repeated falls: Secondary | ICD-10-CM

## 2022-03-20 NOTE — Therapy (Signed)
OUTPATIENT PHYSICAL THERAPY TREATMENT NOTE       Patient Name: Alex Hampton MRN: [id number] DOB:[birth year], 66 y.o., male Today's Date: 03/20/2022  PCP: Dr. Juluis Pitch REFERRING PROVIDER: Eulis Canner, MD   PT End of Session - 03/20/22 1349     Visit Number 81    Number of Visits 41    Date for PT Re-Evaluation 04/03/22    Authorization Type Medicare/BCBS; PN 03/23/21    Authorization Time Period Initial Cert= 02/54/2706- 23/76/2831; Recert 08/25/6158-7/37/1062: Recert 6/94/8546- 05/17/348: Recert 0/12/3816- 2/99/3716; Recert 96/10/8936- 01/24/5101    Progress Note Due on Visit 80    PT Start Time 1346    PT Stop Time 1406    PT Time Calculation (min) 20 min    Equipment Utilized During Treatment Gait belt    Activity Tolerance Patient tolerated treatment well    Behavior During Therapy [facility] for tasks assessed/performed                              Past Medical History:  Diagnosis Date   Depression    History of cardiac cath    History of heart artery stent    Hyperlipidemia    Hypertension    MI (myocardial infarction) ([place])    Renal disorder    Past Surgical History:  Procedure Laterality Date   IR [place] TUBE CHANGE  11/22/2020   IR [place] TUBE CHANGE  11/26/2020   IR [place] GASTRO/COLONIC TUBE PERCUT W/FLUORO  10/14/2020   LEFT HEART CATH AND CORONARY ANGIOGRAPHY N/A 08/19/2020   Procedure: LEFT HEART CATH AND CORONARY ANGIOGRAPHY;  Surgeon: Corey Skains, MD;  Location: [place] [facility];  Service: Cardiovascular;  Laterality: N/A;   LIVER TRANSPLANT     Patient Active Problem List   Diagnosis Date Noted   Pulmonary embolism ([place]) 08/08/2021   CAP (community acquired pneumonia) 08/08/2021   Protein calorie malnutrition ([place]) 08/08/2021   Hypokalemia 08/08/2021   Pancreatic cyst    Verbal auditory hallucination    Early satiety    Feeding tube dysfunction    Generalized weakness 11/26/2020   Gastrostomy tube  dysfunction ([place]) 11/26/2020   Recurrent falls 11/26/2020   Syncope 10/15/2020   Uremia 10/10/2020   ESRD on hemodialysis ([place]) 10/10/2020   Hyperkalemia 10/10/2020   Elevated troponin 10/10/2020   GERD (gastroesophageal reflux disease) 10/10/2020   CAD (coronary artery disease) 10/10/2020   G tube feedings ([place]) 10/10/2020   Diarrhea 10/10/2020   Diabetes mellitus ([place]) 10/10/2020   Weakness    ACS (acute coronary syndrome) ([place])    Liver transplant recipient [facility])    Anemia of chronic disease    Type 2 diabetes mellitus with diabetic neuropathy, with long-term current use of insulin ([place])    ESRD (end stage renal disease) ([place]) 08/19/2020   History of anemia due to chronic kidney disease 08/19/2020   Gait abnormality 10/17/2019   History of fall within past 90 days 10/17/2019   Visual hallucination 10/17/2019   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 10/17/2019   HTN (hypertension) 10/17/2019   History of MI (myocardial infarction) 10/17/2019   OSA (obstructive sleep apnea) 10/17/2019   Nondisplaced comminuted supracondylar fracture without intercondylar fracture of left humerus, subsequent encounter for fracture with nonunion 10/17/2019   Anxiety and depression 10/17/2019    REFERRING DIAG: Other malaise Z94.4 (ICD-10-CM) - Liver transplant status R26.9 (ICD-10-CM) - Unspecified abnormalities of gait and mobility  THERAPY DIAG:  Abnormality of gait and mobility  Difficulty in walking, not elsewhere classified  Muscle weakness (generalized)  Other abnormalities of gait and mobility  Other lack of coordination  Unsteadiness on feet  Repeated falls  Rationale for Evaluation and Treatment Rehabilitation  PERTINENT HISTORY: Patient is a 66 year old male with recent referral for abnomality of gait and recent liver transplant in November 2021. He has past medical history significant for Liver transplant (02/18/2020), CKD, End stage renal disease- On hemodialyis  T/TH/Sat., Anemia, DM with neuropathy, Non-stemi, Coronary artery disease, Feeding tube with poor appetite, PE now on eliquis   PRECAUTIONS: none  SUBJECTIVE: Patient reports not feeling well at all today. States he woke up with some right sided low back pain and now his stomach is really bothering him.    PAIN:  Are you having pain? Yes- 2/10 Right sided low back pain.      TODAY'S TREATMENT:  03/20/2022  THEREX:   Seated LAQ 3# AW - 2 sets of 10 reps Seated Hip march 3#- attempted yet patient complaining of some right sided low back pain so terminated exercises.  Seated calf raises 3# AW on incline- 2 sets of 10 reps Seated toe raises 3# AW on incline- 2 sets of 10 reps Seated ham curls with BTB - 2 sets of reps.  Seated hip flex/abd up/over 1/2 spike ball with 3# AW - 2 sets x 10 reps.   *Patient abruptly Stopped stating he was not feeling well and stomach hurting so he requested to stop.      Education provided throughout session via VC/TC and demonstration to facilitate movement at target joints and correct muscle activation for all testing and exercises performed.        PATIENT EDUCATION: Education details: Exercise technique, progress towards remainder goals Person educated: Patient Education method: Explanation, Demonstration, Tactile cues, and Verbal cues Education comprehension: verbalized understanding, returned demonstration, verbal cues required, tactile cues required, and needs further education   HOME EXERCISE PROGRAM: No updates   PT Short Term Goals -      PT SHORT TERM GOAL #1   Title Pt will be independent with HEP in order to improve strength and balance in order to decrease fall risk and improve function at home and work.    Baseline 02/02/2021: Patient reports limited HEP in place currently from [facility] agency 12/14 compliance; 1/20/2023Patient states no questions regarding his current exercise regimen and states he is compliant.    Time 6     Period Weeks    Status Achieved    Target Date 03/16/21              PT Long Term Goals -       PT LONG TERM GOAL #1   Title Pt will improve FOTO to target score of 50 to display perceived improvements in ability to complete ADL's.    Baseline 02/02/2021= 43 12/143: 51%; 04/29/2021= 53%; 09/09/2021= 53; 10/12/2021=54%   Time 12    Period Weeks    Status Achieved      PT LONG TERM GOAL #2   Title Pt will decrease 5TSTS by at least 8 seconds in order to demonstrate clinically significant improvement in LE strength.    Baseline 02/02/2021= 28.22 sec with BUE Support 12/14: 23.1 seconds; 04/29/2021=Unable to test today due to patient became nauseous with 6 min walk test and attempted TUG- unable to continue- will reassess next 1-2 visits; 06/08/2021= 25.1 sec with min BUE support;  07/20/2021= 17.75 with min BUE support    Time 12    Period Weeks    Status Achieved    Target Date 07/22/21      PT LONG TERM GOAL #3   Title Pt will decrease TUG to below 19 seconds/decrease in order to demonstrate decreased fall risk.    Baseline 02/02/2021= 27.30 sec without AD 12/14: 23 seconds; 04/29/2021- Unable to assess as patient became very nauseated upon standing and request to stop treatment. 06/08/2021= 19.10 sec without an AD; 07/20/2021= 15.68 sec without UE Support    Time 12    Period Weeks    Status Achieved    Target Date 07/22/21      PT LONG TERM GOAL #4   Title Pt will increase 6MWT by at least 24m(1623f in order to demonstrate clinically significant improvement in cardiopulmonary endurance and community ambulation    Baseline 02/02/2021= 83 feet in 1 min 10 sec wihtout an AD 12/14: 200 ft in  61m46mtes; 04/29/2021=Patient ambulated approx 60 feet yet had to stop due onset of nausea and unable to complete today; 07/15/2021= 330 feet in 2 min 25 sec without AD.  09/09/2021= 350 feet in 3 min 45 sec. 10/12/2021= 710 feet in 6 min- no AD (Short stand rest break)    Time 12    Period Weeks    Status  Achieved   Target Date 07/22/21      PT LONG TERM GOAL #5   Title Pt will increase 10MWT by at least 0.2 m/s in order to demonstrate clinically significant improvement in community ambulation.    Baseline 02/02/2021= 0.42 m/s 12/14: 0.49 m/s; 04/29/2021=Patient unable to test today secondary to being nauseated. 06/08/2021= 0.62 m/s; 07/20/2021= 0.87 m/s    Time 12    Period Weeks    Status Achieved    Target Date 07/22/21      PT LONG TERM GOAL #6   Title Pt will decrease 5TSTS by at least 3 seconds in order to demonstrate clinically significant improvement in LE strength.    Baseline 07/20/2021= 17.75 sec with Minimal BUE Support; 09/09/2021= 17.50 sec with very minimal UE Support. 10/12/2021= 14.90 sec with min BUE Support. 12/05/2021= 14.30 sec with BUE; 01/09/2022= 23.11 sec with min UE on thighs vs. Arm rests for more emphasis on BLE.  03/15/2022= 17.46 sec without UE Support.   Time 12    Period Weeks    Status Ongoing   Target Date 04/03/2022   PT LONG TERM GOAL #7 Title:  Pt will increase 6MWT by at least 46m69m4ft56f order to demonstrate clinically significant improvement in cardiopulmonary endurance and community ambulation                                                       Baseline: 10/12/2021= 710 feet; 12/05/2021= 827 feet; 01/09/2022= 620 feet without an AD - stopping at 4 min mark due to self reported fatigue. 03/15/2022= 1040 feet Goal status: ONGOING Target date: 04/03/2022        PT LONG TERM GOAL #8  Title Patient will demonstrate an improved Berg Balance Score of > 50/56 as to demonstrate improved balance with ADLs such as sitting/standing and transfer balance and reduced fall risk.  Baseline 01/09/2022= 47/56  Time 12   Period Weeks   Status INITIAL  Target Date 04/03/2022        Plan -      Clinical Impression Statement Treatment limited today secondary to patient not feeling well and stopping during middle of therex. He reports some stomach issues that worsened  during session. Unable to progress patient today due to patient not feeling well. Pt will continue to benefit from skilled physical therapy intervention to address impairments, improve QOL, and attain therapy goals.     Personal Factors and Comorbidities Comorbidity 3+    Comorbidities DM, Liver transplant, ESRD, CAD, feeding tube    Examination-Activity Limitations Carry;Lift;Reach Overhead;Squat;Stairs;Stand;Transfers;Toileting    Examination-Participation Restrictions Cleaning;Community Activity;Driving;Medication Management;Meal Prep;Yard Work    [employer] Evolving/Moderate complexity    Rehab Potential Fair    PT Frequency 2x / week    PT Duration 12 weeks    PT Treatment/Interventions ADLs/Self Care Home Management;Cryotherapy;Moist Heat;DME Instruction;Gait training;Stair training;Functional mobility training;Therapeutic activities;Therapeutic exercise;Balance training;Neuromuscular re-education;Patient/family education;Wheelchair mobility training;Manual techniques;Passive range of motion;Dry needling;Energy conservation;Joint Manipulations    PT Next Visit Plan Progressive Therapeutic exercises, Balance training, transfer/gait training    PT Home Exercise Plan No changes or updates today.    Consulted and Agree with Plan of Care Patient              Lewis Moccasin PT  03/20/2022, 2:11 PM

## 2022-03-22 ENCOUNTER — Ambulatory Visit: Payer: Medicare Other

## 2022-03-27 ENCOUNTER — Ambulatory Visit: Payer: Medicare Other

## 2022-03-27 ENCOUNTER — Telehealth: Payer: Self-pay

## 2022-03-27 NOTE — Telephone Encounter (Signed)
Patient advised of BX results. aw

## 2022-03-27 NOTE — Telephone Encounter (Signed)
-----   Message from Brendolyn Patty, MD sent at 03/27/2022  8:42 AM EST ----- Skin , right preauricular INVERTED FOLLICULAR KERATOSIS, CRUSTED  benign type of keratosis, no further treatment needed- please call patient

## 2022-03-27 NOTE — Telephone Encounter (Signed)
Left pt message to call for bx result/sh

## 2022-03-29 ENCOUNTER — Ambulatory Visit: Payer: Medicare Other

## 2022-03-29 DIAGNOSIS — M6281 Muscle weakness (generalized): Secondary | ICD-10-CM

## 2022-03-29 DIAGNOSIS — R2681 Unsteadiness on feet: Secondary | ICD-10-CM

## 2022-03-29 DIAGNOSIS — R278 Other lack of coordination: Secondary | ICD-10-CM

## 2022-03-29 DIAGNOSIS — R269 Unspecified abnormalities of gait and mobility: Secondary | ICD-10-CM | POA: Diagnosis not present

## 2022-03-29 DIAGNOSIS — R2689 Other abnormalities of gait and mobility: Secondary | ICD-10-CM

## 2022-03-29 DIAGNOSIS — R296 Repeated falls: Secondary | ICD-10-CM

## 2022-03-29 DIAGNOSIS — R262 Difficulty in walking, not elsewhere classified: Secondary | ICD-10-CM

## 2022-03-29 NOTE — Therapy (Signed)
OUTPATIENT PHYSICAL THERAPY TREATMENT NOTE       Patient Name: Alex Hampton MRN: [id number] DOB:10/25/1955, 66 y.o., male Today's Date: 03/30/2022  PCP: Dr. Juluis Pitch REFERRING PROVIDER: Eulis Canner, MD   PT End of Session - 03/29/22 1353     Visit Number 82    Number of Visits 60    Date for PT Re-Evaluation 04/03/22    Authorization Type Medicare/BCBS; PN 03/23/21    Authorization Time Period Initial Cert= 68/34/1962- 22/97/9892; Recert 04/28/4172-0/81/4481: Recert 8/56/3149- 7/0/2637: Recert 11/12/8848- 2/77/4128; Recert 78/09/7670- 09/47/0962    Progress Note Due on Visit 18    PT Start Time 1347    PT Stop Time 1413    PT Time Calculation (min) 26 min    Equipment Utilized During Treatment Gait belt    Activity Tolerance Patient tolerated treatment well    Behavior During Therapy WFL for tasks assessed/performed                              Past Medical History:  Diagnosis Date   Depression    History of cardiac cath    History of heart artery stent    Hyperlipidemia    Hypertension    MI (myocardial infarction) ([place])    Renal disorder    Past Surgical History:  Procedure Laterality Date   IR GJ TUBE CHANGE  11/22/2020   IR [place] TUBE CHANGE  11/26/2020   IR [place] GASTRO/COLONIC TUBE PERCUT W/FLUORO  10/14/2020   LEFT HEART CATH AND CORONARY ANGIOGRAPHY N/A 08/19/2020   Procedure: LEFT HEART CATH AND CORONARY ANGIOGRAPHY;  Surgeon: Corey Skains, MD;  Location: [place] [facility];  Service: Cardiovascular;  Laterality: N/A;   LIVER TRANSPLANT     Patient Active Problem List   Diagnosis Date Noted   Pulmonary embolism ([place]) 08/08/2021   CAP (community acquired pneumonia) 08/08/2021   Protein calorie malnutrition ([place]) 08/08/2021   Hypokalemia 08/08/2021   Pancreatic cyst    Verbal auditory hallucination    Early satiety    Feeding tube dysfunction    Generalized weakness 11/26/2020   Gastrostomy tube  dysfunction ([place]) 11/26/2020   Recurrent falls 11/26/2020   Syncope 10/15/2020   Uremia 10/10/2020   ESRD on hemodialysis ([place]) 10/10/2020   Hyperkalemia 10/10/2020   Elevated troponin 10/10/2020   GERD (gastroesophageal reflux disease) 10/10/2020   CAD (coronary artery disease) 10/10/2020   G tube feedings ([place]) 10/10/2020   Diarrhea 10/10/2020   Diabetes mellitus ([place]) 10/10/2020   Weakness    ACS (acute coronary syndrome) ([place])    Liver transplant recipient [facility]    Anemia of chronic disease    Type 2 diabetes mellitus with diabetic neuropathy, with long-term current use of insulin ([place])    ESRD (end stage renal disease) ([place]) 08/19/2020   History of anemia due to chronic kidney disease 08/19/2020   Gait abnormality 10/17/2019   History of fall within past 90 days 10/17/2019   Visual hallucination 10/17/2019   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 10/17/2019   HTN (hypertension) 10/17/2019   History of MI (myocardial infarction) 10/17/2019   OSA (obstructive sleep apnea) 10/17/2019   Nondisplaced comminuted supracondylar fracture without intercondylar fracture of left humerus, subsequent encounter for fracture with nonunion 10/17/2019   Anxiety and depression 10/17/2019    REFERRING DIAG: Other malaise Z94.4 (ICD-10-CM) - Liver transplant status R26.9 (ICD-10-CM) - Unspecified abnormalities of gait and mobility  THERAPY DIAG:  Abnormality of gait and mobility  Difficulty in walking, not elsewhere classified  Muscle weakness (generalized)  Other abnormalities of gait and mobility  Other lack of coordination  Unsteadiness on feet  Repeated falls  Rationale for Evaluation and Treatment Rehabilitation  PERTINENT HISTORY: Patient is a 66 year old male with recent referral for abnomality of gait and recent liver transplant in November 2021. He has past medical history significant for Liver transplant (02/18/2020), CKD, End stage renal disease- On hemodialyis  T/TH/Sat., Anemia, DM with neuropathy, Non-stemi, Coronary artery disease, Feeding tube with poor appetite, PE now on eliquis   PRECAUTIONS: none  SUBJECTIVE: Patient reports continuing to not feel well and states feeling puny.   PAIN:  Are you having pain? No     TODAY'S TREATMENT:  03/29/2022  THEREX: all therex performed each LE  Seated LAQ 3# AW - 2 sets of 10 reps Seated Hip march 4#- attempted yet patient complaining of some right sided low back pain so terminated exercises.  Seated calf raises 4# AW on 1/2 foam- 2 sets of 12 reps Seated toe raises 4# AW on 1/2 foam- 2 sets of 12 reps Seated ham curls with BTB - 2 sets of reps.  Seated hip flex/abd up/over 1/2 foam with 4# AW - 2 sets x 10 reps.   *Patient requested to stop again today stating  just not feel well and very tired today.      Education provided throughout session via VC/TC and demonstration to facilitate movement at target joints and correct muscle activation for all testing and exercises performed.        PATIENT EDUCATION: Education details: Exercise technique, progress towards remainder goals Person educated: Patient Education method: Explanation, Demonstration, Tactile cues, and Verbal cues Education comprehension: verbalized understanding, returned demonstration, verbal cues required, tactile cues required, and needs further education   HOME EXERCISE PROGRAM: No updates   PT Short Term Goals -      PT SHORT TERM GOAL #1   Title Pt will be independent with HEP in order to improve strength and balance in order to decrease fall risk and improve function at home and work.    Baseline 02/02/2021: Patient reports limited HEP in place currently from [facility] agency 12/14 compliance; 1/20/2023Patient states no questions regarding his current exercise regimen and states he is compliant.    Time 6    Period Weeks    Status Achieved    Target Date 03/16/21              PT Long Term Goals -        PT LONG TERM GOAL #1   Title Pt will improve FOTO to target score of 50 to display perceived improvements in ability to complete ADL's.    Baseline 02/02/2021= 43 12/143: 51%; 04/29/2021= 53%; 09/09/2021= 53; 10/12/2021=54%   Time 12    Period Weeks    Status Achieved      PT LONG TERM GOAL #2   Title Pt will decrease 5TSTS by at least 8 seconds in order to demonstrate clinically significant improvement in LE strength.    Baseline 02/02/2021= 28.22 sec with BUE Support 12/14: 23.1 seconds; 04/29/2021=Unable to test today due to patient became nauseous with 6 min walk test and attempted TUG- unable to continue- will reassess next 1-2 visits; 06/08/2021= 25.1 sec with min BUE support; 07/20/2021= 17.75 with min BUE support    Time 12    Period Weeks    Status  Achieved    Target Date 07/22/21      PT LONG TERM GOAL #3   Title Pt will decrease TUG to below 19 seconds/decrease in order to demonstrate decreased fall risk.    Baseline 02/02/2021= 27.30 sec without AD 12/14: 23 seconds; 04/29/2021- Unable to assess as patient became very nauseated upon standing and request to stop treatment. 06/08/2021= 19.10 sec without an AD; 07/20/2021= 15.68 sec without UE Support    Time 12    Period Weeks    Status Achieved    Target Date 07/22/21      PT LONG TERM GOAL #4   Title Pt will increase 6MWT by at least 32m(1662f in order to demonstrate clinically significant improvement in cardiopulmonary endurance and community ambulation    Baseline 02/02/2021= 83 feet in 1 min 10 sec wihtout an AD 12/14: 200 ft in  86m5mtes; 04/29/2021=Patient ambulated approx 60 feet yet had to stop due onset of nausea and unable to complete today; 07/15/2021= 330 feet in 2 min 25 sec without AD.  09/09/2021= 350 feet in 3 min 45 sec. 10/12/2021= 710 feet in 6 min- no AD (Short stand rest break)    Time 12    Period Weeks    Status Achieved   Target Date 07/22/21      PT LONG TERM GOAL #5   Title Pt will increase 10MWT by at least 0.2  m/s in order to demonstrate clinically significant improvement in community ambulation.    Baseline 02/02/2021= 0.42 m/s 12/14: 0.49 m/s; 04/29/2021=Patient unable to test today secondary to being nauseated. 06/08/2021= 0.62 m/s; 07/20/2021= 0.87 m/s    Time 12    Period Weeks    Status Achieved    Target Date 07/22/21      PT LONG TERM GOAL #6   Title Pt will decrease 5TSTS by at least 3 seconds in order to demonstrate clinically significant improvement in LE strength.    Baseline 07/20/2021= 17.75 sec with Minimal BUE Support; 09/09/2021= 17.50 sec with very minimal UE Support. 10/12/2021= 14.90 sec with min BUE Support. 12/05/2021= 14.30 sec with BUE; 01/09/2022= 23.11 sec with min UE on thighs vs. Arm rests for more emphasis on BLE.  03/15/2022= 17.46 sec without UE Support.   Time 12    Period Weeks    Status Ongoing   Target Date 04/03/2022   PT LONG TERM GOAL #7 Title:  Pt will increase 6MWT by at least 43m14m4ft55f order to demonstrate clinically significant improvement in cardiopulmonary endurance and community ambulation                                                       Baseline: 10/12/2021= 710 feet; 12/05/2021= 827 feet; 01/09/2022= 620 feet without an AD - stopping at 4 min mark due to self reported fatigue. 03/15/2022= 1040 feet Goal status: ONGOING Target date: 04/03/2022        PT LONG TERM GOAL #8  Title Patient will demonstrate an improved Berg Balance Score of > 50/56 as to demonstrate improved balance with ADLs such as sitting/standing and transfer balance and reduced fall risk.  Baseline 01/09/2022= 47/56  Time 12   Period Weeks   Status INITIAL  Target Date 04/03/2022        Plan -      Clinical Impression  Statement Treatment limited again today secondary to patient not feeling well and stopping during middle of therex. He states fatigue and just not feeling well as limiting factors.  Unable to progress patient today due to patient not feeling well. Pt will continue to  benefit from skilled physical therapy intervention to address impairments, improve QOL, and attain therapy goals.     Personal Factors and Comorbidities Comorbidity 3+    Comorbidities DM, Liver transplant, ESRD, CAD, feeding tube    Examination-Activity Limitations Carry;Lift;Reach Overhead;Squat;Stairs;Stand;Transfers;Toileting    Examination-Participation Restrictions Cleaning;Community Activity;Driving;Medication Management;Meal Prep;Yard Work    [employer] officer Evolving/Moderate complexity    Rehab Potential Fair    PT Frequency 2x / week    PT Duration 12 weeks    PT Treatment/Interventions ADLs/Self Care Home Management;Cryotherapy;Moist Heat;DME Instruction;Gait training;Stair training;Functional mobility training;Therapeutic activities;Therapeutic exercise;Balance training;Neuromuscular re-education;Patient/family education;Wheelchair mobility training;Manual techniques;Passive range of motion;Dry needling;Energy conservation;Joint Manipulations    PT Next Visit Plan Progressive Therapeutic exercises, Balance training, transfer/gait training    PT Home Exercise Plan No changes or updates today.    Consulted and Agree with Plan of Care Patient              Lewis Moccasin PT  03/30/2022, 8:50 AM

## 2022-04-05 ENCOUNTER — Ambulatory Visit: Payer: Medicare Other

## 2022-04-12 ENCOUNTER — Ambulatory Visit: Payer: Medicare Other | Attending: Gastroenterology

## 2022-04-12 DIAGNOSIS — R2689 Other abnormalities of gait and mobility: Secondary | ICD-10-CM | POA: Diagnosis present

## 2022-04-12 DIAGNOSIS — R269 Unspecified abnormalities of gait and mobility: Secondary | ICD-10-CM | POA: Diagnosis not present

## 2022-04-12 DIAGNOSIS — R2681 Unsteadiness on feet: Secondary | ICD-10-CM | POA: Diagnosis present

## 2022-04-12 DIAGNOSIS — M6281 Muscle weakness (generalized): Secondary | ICD-10-CM | POA: Insufficient documentation

## 2022-04-12 DIAGNOSIS — R296 Repeated falls: Secondary | ICD-10-CM | POA: Insufficient documentation

## 2022-04-12 DIAGNOSIS — R278 Other lack of coordination: Secondary | ICD-10-CM | POA: Diagnosis present

## 2022-04-12 DIAGNOSIS — R262 Difficulty in walking, not elsewhere classified: Secondary | ICD-10-CM | POA: Diagnosis present

## 2022-04-12 NOTE — Therapy (Signed)
OUTPATIENT PHYSICAL THERAPY TREATMENT NOTE/ RECERTIFICATION       Patient Name: Alex Hampton MRN: [id number] DOB:1955-09-27, 67 y.o., male Today's Date: 04/13/2022  PCP: Dr. Juluis Pitch REFERRING PROVIDER: Eulis Canner, MD   PT End of Session - 04/12/22 1407     Visit Number 47    Number of Visits 107    Date for PT Re-Evaluation 07/05/22    Authorization Type Medicare/BCBS; PN 03/23/21    Authorization Time Period Initial Cert= 25/95/6387- 56/43/3295; Recert 1/88/4166-0/63/0160: Recert 04/18/3233- 08/15/3218: Recert 05/15/4268- 10/01/7626; Recert 31/08/1759- 60/73/7106    Progress Note Due on Visit 90    PT Start Time 1348    PT Stop Time 1427    PT Time Calculation (min) 39 min    Equipment Utilized During Treatment Gait belt    Activity Tolerance Patient tolerated treatment well    Behavior During Therapy WFL for tasks assessed/performed                              Past Medical History:  Diagnosis Date   Depression    History of cardiac cath    History of heart artery stent    Hyperlipidemia    Hypertension    MI (myocardial infarction) ([place])    Renal disorder    Past Surgical History:  Procedure Laterality Date   IR [facility] TUBE CHANGE  11/22/2020   IR [place] TUBE CHANGE  11/26/2020   IR [place] GASTRO/COLONIC TUBE PERCUT W/FLUORO  10/14/2020   LEFT HEART CATH AND CORONARY ANGIOGRAPHY N/A 08/19/2020   Procedure: LEFT HEART CATH AND CORONARY ANGIOGRAPHY;  Surgeon: Corey Skains, MD;  Location: [facility];  Service: Cardiovascular;  Laterality: N/A;   LIVER TRANSPLANT     Patient Active Problem List   Diagnosis Date Noted   Pulmonary embolism ([place]) 08/08/2021   CAP (community acquired pneumonia) 08/08/2021   Protein calorie malnutrition ([place]) 08/08/2021   Hypokalemia 08/08/2021   Pancreatic cyst    Verbal auditory hallucination    Early satiety    Feeding tube dysfunction    Generalized weakness 11/26/2020   Gastrostomy  tube dysfunction ([place]) 11/26/2020   Recurrent falls 11/26/2020   Syncope 10/15/2020   Uremia 10/10/2020   ESRD on hemodialysis ([place]) 10/10/2020   Hyperkalemia 10/10/2020   Elevated troponin 10/10/2020   GERD (gastroesophageal reflux disease) 10/10/2020   CAD (coronary artery disease) 10/10/2020   G tube feedings ([place]) 10/10/2020   Diarrhea 10/10/2020   Diabetes mellitus ([place]) 10/10/2020   Weakness    ACS (acute coronary syndrome) ([place])    Liver transplant recipient [facility])    Anemia of chronic disease    Type 2 diabetes mellitus with diabetic neuropathy, with long-term current use of insulin ([place])    ESRD (end stage renal disease) ([place]) 08/19/2020   History of anemia due to chronic kidney disease 08/19/2020   Gait abnormality 10/17/2019   History of fall within past 90 days 10/17/2019   Visual hallucination 10/17/2019   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 10/17/2019   HTN (hypertension) 10/17/2019   History of MI (myocardial infarction) 10/17/2019   OSA (obstructive sleep apnea) 10/17/2019   Nondisplaced comminuted supracondylar fracture without intercondylar fracture of left humerus, subsequent encounter for fracture with nonunion 10/17/2019   Anxiety and depression 10/17/2019    REFERRING DIAG: Other malaise Z94.4 (ICD-10-CM) - Liver transplant status R26.9 (ICD-10-CM) - Unspecified abnormalities of gait and mobility  THERAPY DIAG:  Abnormality of gait and mobility  Difficulty in walking, not elsewhere classified  Muscle weakness (generalized)  Other abnormalities of gait and mobility  Other lack of coordination  Unsteadiness on feet  Rationale for Evaluation and Treatment Rehabilitation  PERTINENT HISTORY: Patient is a 67 year old male with recent referral for abnomality of gait and recent liver transplant in November 2021. He has past medical history significant for Liver transplant (02/18/2020), CKD, End stage renal disease- On hemodialyis T/TH/Sat., Anemia,  DM with neuropathy, Non-stemi, Coronary artery disease, Feeding tube with poor appetite, PE now on eliquis   PRECAUTIONS: none  SUBJECTIVE: Patient reports having some good and bad days- States not at his best today but will do his best. Agreeable to testing for recert visit.   PAIN:  Are you having pain? No     TODAY'S TREATMENT:  04/12/2022  THEREX: all therex performed each LE  Step tap without UE support x 15 reps for warm up-   Reassessed goals for recert visit- See goal section for details.       Strength R/L 4/4 Shoulder flexion (anterior deltoid/pec major/coracobrachialis, axillary n. (C5-6) and musculocutaneous n. (C5-7)) 4/4 Shoulder abduction (deltoid/supraspinatus, axillary/suprascapular n, C5) 4/4 Shoulder external rotation (infraspinatus/teres minor) 4/4 Shoulder internal rotation (subcapularis/lats/pec major) 4/4 Shoulder extension (posterior deltoid, lats, teres major, axillary/thoracodorsal n.) 4/4 Shoulder horizontal abduction 4/4 Elbow flexion (biceps brachii, brachialis, brachioradialis, musculoskeletal n, C5-6) 4/4 Elbow extension (triceps, radial n, C7)  Education provided throughout session via VC/TC and demonstration to facilitate movement at target joints and correct muscle activation for all testing and exercises performed.        PATIENT EDUCATION: Education details: Exercise technique, progress towards remainder goals Person educated: Patient Education method: Explanation, Demonstration, Tactile cues, and Verbal cues Education comprehension: verbalized understanding, returned demonstration, verbal cues required, tactile cues required, and needs further education   HOME EXERCISE PROGRAM: No updates   PT Short Term Goals -      PT SHORT TERM GOAL #1   Title Pt will be independent with HEP in order to improve strength and balance in order to decrease fall risk and improve function at home and work.    Baseline 02/02/2021: Patient reports  limited HEP in place currently from [facility] agency 12/14 compliance; 1/20/2023Patient states no questions regarding his current exercise regimen and states he is compliant.    Time 6    Period Weeks    Status Achieved    Target Date 03/16/21              PT Long Term Goals -       PT LONG TERM GOAL #1   Title Pt will improve FOTO to target score of 50 to display perceived improvements in ability to complete ADL's.    Baseline 02/02/2021= 43 12/143: 51%; 04/29/2021= 53%; 09/09/2021= 53; 10/12/2021=54%   Time 12    Period Weeks    Status Achieved      PT LONG TERM GOAL #2   Title Pt will decrease 5TSTS by at least 8 seconds in order to demonstrate clinically significant improvement in LE strength.    Baseline 02/02/2021= 28.22 sec with BUE Support 12/14: 23.1 seconds; 04/29/2021=Unable to test today due to patient became nauseous with 6 min walk test and attempted TUG- unable to continue- will reassess next 1-2 visits; 06/08/2021= 25.1 sec with min BUE support; 07/20/2021= 17.75 with min BUE support    Time 12    Period Weeks  Status Achieved    Target Date 07/22/21      PT LONG TERM GOAL #3   Title Pt will decrease TUG to below 19 seconds/decrease in order to demonstrate decreased fall risk.    Baseline 02/02/2021= 27.30 sec without AD 12/14: 23 seconds; 04/29/2021- Unable to assess as patient became very nauseated upon standing and request to stop treatment. 06/08/2021= 19.10 sec without an AD; 07/20/2021= 15.68 sec without UE Support    Time 12    Period Weeks    Status Achieved    Target Date 07/22/21      PT LONG TERM GOAL #4   Title Pt will increase 6MWT by at least 64m(1691f in order to demonstrate clinically significant improvement in cardiopulmonary endurance and community ambulation    Baseline 02/02/2021= 83 feet in 1 min 10 sec wihtout an AD 12/14: 200 ft in  82m35mtes; 04/29/2021=Patient ambulated approx 60 feet yet had to stop due onset of nausea and unable to complete today;  07/15/2021= 330 feet in 2 min 25 sec without AD.  09/09/2021= 350 feet in 3 min 45 sec. 10/12/2021= 710 feet in 6 min- no AD (Short stand rest break)    Time 12    Period Weeks    Status Achieved   Target Date 07/22/21      PT LONG TERM GOAL #5   Title Pt will increase 10MWT by at least 0.2 m/s in order to demonstrate clinically significant improvement in community ambulation.    Baseline 02/02/2021= 0.42 m/s 12/14: 0.49 m/s; 04/29/2021=Patient unable to test today secondary to being nauseated. 06/08/2021= 0.62 m/s; 07/20/2021= 0.87 m/s    Time 12    Period Weeks    Status Achieved    Target Date 07/22/21      PT LONG TERM GOAL #6   Title Pt will decrease 5TSTS by at least 3 seconds in order to demonstrate clinically significant improvement in LE strength.    Baseline 07/20/2021= 17.75 sec with Minimal BUE Support; 09/09/2021= 17.50 sec with very minimal UE Support. 10/12/2021= 14.90 sec with min BUE Support. 12/05/2021= 14.30 sec with BUE; 01/09/2022= 23.11 sec with min UE on thighs vs. Arm rests for more emphasis on BLE.  03/15/2022= 17.46 sec without UE Support. 04/11/2022= 15.22 sec without UE support   Time 12    Period Weeks    Status Ongoing   Target Date 07/05/2022   PT LONG TERM GOAL #7 Title:  Pt will increase 6MWT by at least [id number] order to demonstrate clinically significant improvement in cardiopulmonary endurance and community ambulation                                                       Baseline: 10/12/2021= 710 feet; 12/05/2021= 827 feet; 01/09/2022= 620 feet without an AD - stopping at 4 min mark due to self reported fatigue. 03/15/2022= 1040 feet; 04/13/2022= Patient too fatigued to attempt after other testing. Will assess next visit.  Goal status: ONGOING Target date: 07/05/2022        PT LONG TERM GOAL #8  Title Patient will demonstrate an improved Berg Balance Score of > 50/56 as to demonstrate improved balance with ADLs such as sitting/standing and transfer balance and reduced fall  risk.  Baseline 01/09/2022= 47/56; 04/12/2022= 53/56  Time 12   Period Weeks  Status Met  Target Date 04/03/2022   PT LONG TERM GOAL #9  Title Pt will increase BUE shoulder/elbow strength of  by at least 1/2 MMT grade in order to demonstrate improvement in strength and function    Baseline 04/13/2022= BUE = 4/5 throughtout  Time 12   Period Weeks   Status NEW  Target Date 07/05/2022        Plan -      Clinical Impression Statement Overall, patient continues to make slow progress except with Balance which he progressed very well meeting his goal and demonstrating good dynamic standing balance. He remains limited with stamina and overall only fair endurance. Addded new goal for UE strengthening to continue to focus on maximizing his strength and he feels his arm are getting weak and presents with 4/5 strength today with MMT.  Patient's condition has the potential to improve in response to therapy. Maximum improvement is yet to be obtained. The anticipated improvement is attainable and reasonable in a generally predictable time.  Patient reports   Pt will continue to benefit from skilled physical therapy intervention to address impairments, improve QOL, and attain therapy goals.     Personal Factors and Comorbidities Comorbidity 3+    Comorbidities DM, Liver transplant, ESRD, CAD, feeding tube    Examination-Activity Limitations Carry;Lift;Reach Overhead;Squat;Stairs;Stand;Transfers;Toileting    Examination-Participation Restrictions Cleaning;Community Activity;Driving;Medication Management;Meal Prep;Yard Work    [employer] Evolving/Moderate complexity    Rehab Potential Fair    PT Frequency 2x / week    PT Duration 12 weeks    PT Treatment/Interventions ADLs/Self Care Home Management;Cryotherapy;Moist Heat;DME Instruction;Gait training;Stair training;Functional mobility training;Therapeutic activities;Therapeutic exercise;Balance training;Neuromuscular  re-education;Patient/family education;Wheelchair mobility training;Manual techniques;Passive range of motion;Dry needling;Energy conservation;Joint Manipulations    PT Next Visit Plan Progressive Therapeutic exercises, Balance training, transfer/gait training    PT Home Exercise Plan No changes or updates today.    Consulted and Agree with Plan of Care Patient              Lewis Moccasin PT  04/13/2022, 5:23 PM

## 2022-04-17 ENCOUNTER — Ambulatory Visit: Payer: Medicare Other

## 2022-04-17 DIAGNOSIS — R278 Other lack of coordination: Secondary | ICD-10-CM

## 2022-04-17 DIAGNOSIS — M6281 Muscle weakness (generalized): Secondary | ICD-10-CM

## 2022-04-17 DIAGNOSIS — R269 Unspecified abnormalities of gait and mobility: Secondary | ICD-10-CM | POA: Diagnosis not present

## 2022-04-17 DIAGNOSIS — R2689 Other abnormalities of gait and mobility: Secondary | ICD-10-CM

## 2022-04-17 DIAGNOSIS — R262 Difficulty in walking, not elsewhere classified: Secondary | ICD-10-CM

## 2022-04-17 DIAGNOSIS — R2681 Unsteadiness on feet: Secondary | ICD-10-CM

## 2022-04-17 NOTE — Therapy (Signed)
OUTPATIENT PHYSICAL THERAPY TREATMENT NOTE       Patient Name: Alex Hampton MRN: [id number] DOB:Aug 19, 1955, 67 y.o., male Today's Date: 04/17/2022  PCP: Dr. Juluis Pitch REFERRING PROVIDER: Eulis Canner, MD   PT End of Session - 04/17/22 1356     Visit Number 65    Number of Visits 107    Date for PT Re-Evaluation 07/05/22    Authorization Type Medicare/BCBS; PN 03/23/21    Authorization Time Period Initial Cert= 40/98/1191- 47/82/9562; Recert 05/10/8655-8/46/9629: Recert 09/05/4130- 07/12/100: Recert 10/09/5364- 4/40/3474; Recert 25/12/5636- 75/64/3329    Progress Note Due on Visit 90    PT Start Time 1346    PT Stop Time 1406    PT Time Calculation (min) 20 min    Equipment Utilized During Treatment Gait belt    Activity Tolerance Patient tolerated treatment well    Behavior During Therapy [facility] for tasks assessed/performed                              Past Medical History:  Diagnosis Date   Depression    History of cardiac cath    History of heart artery stent    Hyperlipidemia    Hypertension    MI (myocardial infarction) ([place])    Renal disorder    Past Surgical History:  Procedure Laterality Date   IR [facility] TUBE CHANGE  11/22/2020   IR [place] TUBE CHANGE  11/26/2020   IR [place] GASTRO/COLONIC TUBE PERCUT W/FLUORO  10/14/2020   LEFT HEART CATH AND CORONARY ANGIOGRAPHY N/A 08/19/2020   Procedure: LEFT HEART CATH AND CORONARY ANGIOGRAPHY;  Surgeon: Corey Skains, MD;  Location: [facility];  Service: Cardiovascular;  Laterality: N/A;   LIVER TRANSPLANT     Patient Active Problem List   Diagnosis Date Noted   Pulmonary embolism ([place]) 08/08/2021   CAP (community acquired pneumonia) 08/08/2021   Protein calorie malnutrition ([place]) 08/08/2021   Hypokalemia 08/08/2021   Pancreatic cyst    Verbal auditory hallucination    Early satiety    Feeding tube dysfunction    Generalized weakness 11/26/2020   Gastrostomy tube dysfunction  ([place]) 11/26/2020   Recurrent falls 11/26/2020   Syncope 10/15/2020   Uremia 10/10/2020   ESRD on hemodialysis ([place]) 10/10/2020   Hyperkalemia 10/10/2020   Elevated troponin 10/10/2020   GERD (gastroesophageal reflux disease) 10/10/2020   CAD (coronary artery disease) 10/10/2020   G tube feedings ([place]) 10/10/2020   Diarrhea 10/10/2020   Diabetes mellitus ([place]) 10/10/2020   Weakness    ACS (acute coronary syndrome) ([place])    Liver transplant recipient [facility])    Anemia of chronic disease    Type 2 diabetes mellitus with diabetic neuropathy, with long-term current use of insulin ([place])    ESRD (end stage renal disease) ([place]) 08/19/2020   History of anemia due to chronic kidney disease 08/19/2020   Gait abnormality 10/17/2019   History of fall within past 90 days 10/17/2019   Visual hallucination 10/17/2019   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 10/17/2019   HTN (hypertension) 10/17/2019   History of MI (myocardial infarction) 10/17/2019   OSA (obstructive sleep apnea) 10/17/2019   Nondisplaced comminuted supracondylar fracture without intercondylar fracture of left humerus, subsequent encounter for fracture with nonunion 10/17/2019   Anxiety and depression 10/17/2019    REFERRING DIAG: Other malaise Z94.4 (ICD-10-CM) - Liver transplant status R26.9 (ICD-10-CM) - Unspecified abnormalities of gait and mobility  THERAPY DIAG:  Difficulty in walking, not elsewhere classified  Muscle weakness (generalized)  Abnormality of gait and mobility  Other abnormalities of gait and mobility  Other lack of coordination  Unsteadiness on feet  Rationale for Evaluation and Treatment Rehabilitation  PERTINENT HISTORY: Patient is a 67 year old male with recent referral for abnomality of gait and recent liver transplant in November 2021. He has past medical history significant for Liver transplant (02/18/2020), CKD, End stage renal disease- On hemodialyis T/TH/Sat., Anemia, DM with  neuropathy, Non-stemi, Coronary artery disease, Feeding tube with poor appetite, PE now on eliquis   PRECAUTIONS: none  SUBJECTIVE: Patient reports feeling decent today with no new issues.   PAIN:  Are you having pain? No  Strength on 04/12/2022 R/L 4/4 Shoulder flexion (anterior deltoid/pec major/coracobrachialis, axillary n. (C5-6) and musculocutaneous n. (C5-7)) 4/4 Shoulder abduction (deltoid/supraspinatus, axillary/suprascapular n, C5) 4/4 Shoulder external rotation (infraspinatus/teres minor) 4/4 Shoulder internal rotation (subcapularis/lats/pec major) 4/4 Shoulder extension (posterior deltoid, lats, teres major, axillary/thoracodorsal n.) 4/4 Shoulder horizontal abduction 4/4 Elbow flexion (biceps brachii, brachialis, brachioradialis, musculoskeletal n, C5-6) 4/4 Elbow extension (triceps, radial n, C7)    TODAY'S TREATMENT:  04/17/2022  THEREX:   Circuit style workout:   UE: AAROM Left Shoulder with PVC- x 12 reps (patient with limited ROM - instructed to stay within painfree ROM- upon completion - he started complaining of increased stomach ache) 2 x 10 reps  LE: seated hip march with 4# AW  x 12 reps alt LE   UE: Bicep dumb bell curl 4# x 12 reps each UE LE: Seated LAQ 4# AW x 12 reps alt LE *Patient abruptly stated he needed to stop due to progressive stomach issues - stating he could no longer continue and needed to go home.    Education provided throughout session via VC/TC and demonstration to facilitate movement at target joints and correct muscle activation for all testing and exercises performed.        PATIENT EDUCATION: Education details: Exercise technique, progress towards remainder goals Person educated: Patient Education method: Explanation, Demonstration, Tactile cues, and Verbal cues Education comprehension: verbalized understanding, returned demonstration, verbal cues required, tactile cues required, and needs further education   HOME EXERCISE  PROGRAM: No updates   PT Short Term Goals -      PT SHORT TERM GOAL #1   Title Pt will be independent with HEP in order to improve strength and balance in order to decrease fall risk and improve function at home and work.    Baseline 02/02/2021: Patient reports limited HEP in place currently from [facility] agency 12/14 compliance; 1/20/2023Patient states no questions regarding his current exercise regimen and states he is compliant.    Time 6    Period Weeks    Status Achieved    Target Date 03/16/21              PT Long Term Goals -       PT LONG TERM GOAL #1   Title Pt will improve FOTO to target score of 50 to display perceived improvements in ability to complete ADL's.    Baseline 02/02/2021= 43 12/143: 51%; 04/29/2021= 53%; 09/09/2021= 53; 10/12/2021=54%   Time 12    Period Weeks    Status Achieved      PT LONG TERM GOAL #2   Title Pt will decrease 5TSTS by at least 8 seconds in order to demonstrate clinically significant improvement in LE strength.    Baseline 02/02/2021= 28.22 sec with  BUE Support 12/14: 23.1 seconds; 04/29/2021=Unable to test today due to patient became nauseous with 6 min walk test and attempted TUG- unable to continue- will reassess next 1-2 visits; 06/08/2021= 25.1 sec with min BUE support; 07/20/2021= 17.75 with min BUE support    Time 12    Period Weeks    Status Achieved    Target Date 07/22/21      PT LONG TERM GOAL #3   Title Pt will decrease TUG to below 19 seconds/decrease in order to demonstrate decreased fall risk.    Baseline 02/02/2021= 27.30 sec without AD 12/14: 23 seconds; 04/29/2021- Unable to assess as patient became very nauseated upon standing and request to stop treatment. 06/08/2021= 19.10 sec without an AD; 07/20/2021= 15.68 sec without UE Support    Time 12    Period Weeks    Status Achieved    Target Date 07/22/21      PT LONG TERM GOAL #4   Title Pt will increase 6MWT by at least 52m(1655f in order to demonstrate clinically significant  improvement in cardiopulmonary endurance and community ambulation    Baseline 02/02/2021= 83 feet in 1 min 10 sec wihtout an AD 12/14: 200 ft in  57m44mtes; 04/29/2021=Patient ambulated approx 60 feet yet had to stop due onset of nausea and unable to complete today; 07/15/2021= 330 feet in 2 min 25 sec without AD.  09/09/2021= 350 feet in 3 min 45 sec. 10/12/2021= 710 feet in 6 min- no AD (Short stand rest break)    Time 12    Period Weeks    Status Achieved   Target Date 07/22/21      PT LONG TERM GOAL #5   Title Pt will increase 10MWT by at least 0.2 m/s in order to demonstrate clinically significant improvement in community ambulation.    Baseline 02/02/2021= 0.42 m/s 12/14: 0.49 m/s; 04/29/2021=Patient unable to test today secondary to being nauseated. 06/08/2021= 0.62 m/s; 07/20/2021= 0.87 m/s    Time 12    Period Weeks    Status Achieved    Target Date 07/22/21      PT LONG TERM GOAL #6   Title Pt will decrease 5TSTS by at least 3 seconds in order to demonstrate clinically significant improvement in LE strength.    Baseline 07/20/2021= 17.75 sec with Minimal BUE Support; 09/09/2021= 17.50 sec with very minimal UE Support. 10/12/2021= 14.90 sec with min BUE Support. 12/05/2021= 14.30 sec with BUE; 01/09/2022= 23.11 sec with min UE on thighs vs. Arm rests for more emphasis on BLE.  03/15/2022= 17.46 sec without UE Support. 04/11/2022= 15.22 sec without UE support   Time 12    Period Weeks    Status Ongoing   Target Date 07/05/2022   PT LONG TERM GOAL #7 Title:  Pt will increase 6MWT by at least [id number] order to demonstrate clinically significant improvement in cardiopulmonary endurance and community ambulation                                                       Baseline: 10/12/2021= 710 feet; 12/05/2021= 827 feet; 01/09/2022= 620 feet without an AD - stopping at 4 min mark due to self reported fatigue. 03/15/2022= 1040 feet; 04/13/2022= Patient too fatigued to attempt after other testing. Will assess next  visit.  Goal status: ONGOING Target date:  07/05/2022        PT LONG TERM GOAL #8  Title Patient will demonstrate an improved Berg Balance Score of > 50/56 as to demonstrate improved balance with ADLs such as sitting/standing and transfer balance and reduced fall risk.  Baseline 01/09/2022= 47/56; 04/12/2022= 53/56  Time 12   Period Weeks   Status Met  Target Date 04/03/2022   PT LONG TERM GOAL #9  Title Pt will increase BUE shoulder/elbow strength of  by at least 1/2 MMT grade in order to demonstrate improvement in strength and function    Baseline 04/13/2022= BUE = 4/5 throughtout  Time 12   Period Weeks   Status NEW  Target Date 07/05/2022        Plan -      Clinical Impression Statement Patient presents today initially feeling well yet almost immediately after starting PT- began to have upset stomach issues. He was able to start some UE/LE exercises yet was very limited and ultimately needed to stop as he reported increased stomach issues. He declined PT going to get a chair to take him out and was able to walk out of clinic with cane and supervision of his son.   Pt will continue to benefit from skilled physical therapy intervention to address impairments, improve QOL, and attain therapy goals.     Personal Factors and Comorbidities Comorbidity 3+    Comorbidities DM, Liver transplant, ESRD, CAD, feeding tube    Examination-Activity Limitations Carry;Lift;Reach Overhead;Squat;Stairs;Stand;Transfers;Toileting    Examination-Participation Restrictions Cleaning;Community Activity;Driving;Medication Management;Meal Prep;Yard Work    [employer] Evolving/Moderate complexity    Rehab Potential Fair    PT Frequency 2x / week    PT Duration 12 weeks    PT Treatment/Interventions ADLs/Self Care Home Management;Cryotherapy;Moist Heat;DME Instruction;Gait training;Stair training;Functional mobility training;Therapeutic activities;Therapeutic exercise;Balance  training;Neuromuscular re-education;Patient/family education;Wheelchair mobility training;Manual techniques;Passive range of motion;Dry needling;Energy conservation;Joint Manipulations    PT Next Visit Plan Progressive Therapeutic exercises, Balance training, transfer/gait training    PT Home Exercise Plan No changes or updates today.    Consulted and Agree with Plan of Care Patient              Lewis Moccasin PT  04/17/2022, 2:18 PM

## 2022-04-19 ENCOUNTER — Ambulatory Visit: Payer: Medicare Other

## 2022-04-24 ENCOUNTER — Ambulatory Visit: Payer: Medicare Other

## 2022-04-24 DIAGNOSIS — M6281 Muscle weakness (generalized): Secondary | ICD-10-CM

## 2022-04-24 DIAGNOSIS — R262 Difficulty in walking, not elsewhere classified: Secondary | ICD-10-CM

## 2022-04-24 DIAGNOSIS — R269 Unspecified abnormalities of gait and mobility: Secondary | ICD-10-CM

## 2022-04-24 DIAGNOSIS — R278 Other lack of coordination: Secondary | ICD-10-CM

## 2022-04-24 DIAGNOSIS — R2681 Unsteadiness on feet: Secondary | ICD-10-CM

## 2022-04-24 DIAGNOSIS — R2689 Other abnormalities of gait and mobility: Secondary | ICD-10-CM

## 2022-04-24 NOTE — Therapy (Signed)
OUTPATIENT PHYSICAL THERAPY TREATMENT NOTE       Patient Name: Alex Hampton MRN: [id number] DOB:[birth year], 67 y.o., male Today's Date: 04/24/2022  PCP: Dr. Juluis Pitch REFERRING PROVIDER: Eulis Canner, MD   PT End of Session - 04/24/22 1352     Visit Number 44    Number of Visits 107    Date for PT Re-Evaluation 07/05/22    Authorization Type Medicare/BCBS; PN 03/23/21    Authorization Time Period Initial Cert= 69/48/5462- 70/35/0093; Recert 11/26/2991-10/23/9676: Recert 9/38/1017- 08/08/256: Recert 08/09/7780- 08/01/5359; Recert 44/06/1538- 08/67/6195    Progress Note Due on Visit 90    PT Start Time 1350    PT Stop Time 1425    PT Time Calculation (min) 35 min    Equipment Utilized During Treatment Gait belt    Activity Tolerance Patient tolerated treatment well    Behavior During Therapy WFL for tasks assessed/performed                               Past Medical History:  Diagnosis Date   Depression    History of cardiac cath    History of heart artery stent    Hyperlipidemia    Hypertension    MI (myocardial infarction) ([place])    Renal disorder    Past Surgical History:  Procedure Laterality Date   IR [place] TUBE CHANGE  11/22/2020   IR [place] TUBE CHANGE  11/26/2020   IR [place] GASTRO/COLONIC TUBE PERCUT W/FLUORO  10/14/2020   LEFT HEART CATH AND CORONARY ANGIOGRAPHY N/A 08/19/2020   Procedure: LEFT HEART CATH AND CORONARY ANGIOGRAPHY;  Surgeon: Corey Skains, MD;  Location: [facility];  Service: Cardiovascular;  Laterality: N/A;   LIVER TRANSPLANT     Patient Active Problem List   Diagnosis Date Noted   Pulmonary embolism ([place]) 08/08/2021   CAP (community acquired pneumonia) 08/08/2021   Protein calorie malnutrition ([place]) 08/08/2021   Hypokalemia 08/08/2021   Pancreatic cyst    Verbal auditory hallucination    Early satiety    Feeding tube dysfunction    Generalized weakness 11/26/2020   Gastrostomy tube  dysfunction ([place]) 11/26/2020   Recurrent falls 11/26/2020   Syncope 10/15/2020   Uremia 10/10/2020   ESRD on hemodialysis ([place]) 10/10/2020   Hyperkalemia 10/10/2020   Elevated troponin 10/10/2020   GERD (gastroesophageal reflux disease) 10/10/2020   CAD (coronary artery disease) 10/10/2020   G tube feedings ([place]) 10/10/2020   Diarrhea 10/10/2020   Diabetes mellitus ([place]) 10/10/2020   Weakness    ACS (acute coronary syndrome) ([place])    Liver transplant recipient [facility])    Anemia of chronic disease    Type 2 diabetes mellitus with diabetic neuropathy, with long-term current use of insulin ([place])    ESRD (end stage renal disease) ([place]) 08/19/2020   History of anemia due to chronic kidney disease 08/19/2020   Gait abnormality 10/17/2019   History of fall within past 90 days 10/17/2019   Visual hallucination 10/17/2019   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 10/17/2019   HTN (hypertension) 10/17/2019   History of MI (myocardial infarction) 10/17/2019   OSA (obstructive sleep apnea) 10/17/2019   Nondisplaced comminuted supracondylar fracture without intercondylar fracture of left humerus, subsequent encounter for fracture with nonunion 10/17/2019   Anxiety and depression 10/17/2019    REFERRING DIAG: Other malaise Z94.4 (ICD-10-CM) - Liver transplant status R26.9 (ICD-10-CM) - Unspecified abnormalities of gait and mobility  THERAPY DIAG:  Difficulty in walking, not elsewhere classified  Muscle weakness (generalized)  Abnormality of gait and mobility  Other abnormalities of gait and mobility  Other lack of coordination  Unsteadiness on feet  Rationale for Evaluation and Treatment Rehabilitation  PERTINENT HISTORY: Patient is a 67 year old male with recent referral for abnomality of gait and recent liver transplant in November 2021. He has past medical history significant for Liver transplant (02/18/2020), CKD, End stage renal disease- On hemodialyis T/TH/Sat., Anemia, DM  with neuropathy, Non-stemi, Coronary artery disease, Feeding tube with poor appetite, PE now on eliquis   PRECAUTIONS: none  SUBJECTIVE:   I am feeling some better than I did last week but still under the weather a bit.    PAIN:  Are you having pain? No  Strength on 04/12/2022 R/L 4/4 Shoulder flexion (anterior deltoid/pec major/coracobrachialis, axillary n. (C5-6) and musculocutaneous n. (C5-7)) 4/4 Shoulder abduction (deltoid/supraspinatus, axillary/suprascapular n, C5) 4/4 Shoulder external rotation (infraspinatus/teres minor) 4/4 Shoulder internal rotation (subcapularis/lats/pec major) 4/4 Shoulder extension (posterior deltoid, lats, teres major, axillary/thoracodorsal n.) 4/4 Shoulder horizontal abduction 4/4 Elbow flexion (biceps brachii, brachialis, brachioradialis, musculoskeletal n, C5-6) 4/4 Elbow extension (triceps, radial n, C7)    TODAY'S TREATMENT:  04/24/2022  THEREX:   Circuit style workout: SEATED secondary to not feeling well   UE: B elbow flex then Shoulder press overhead  with PVC- x 12 reps  LE: seated hip flex/abd/Add up/over orange hurdle with 2# AW  x 12 reps alt LE   UE: chest press/scap row PVC pipe x 12 reps each UE LE: Seated LAQ 2# AW x 12 reps alt LE  UE: shoulder flex with PVC pipe x 12 reps LE: calf raises with 2lb AW on 1/2 foam roll  UE: shoulder ABD with PVC pipe x 12 reps each UE LE: Ambulation in clinic x 300 feet   UE: BUE tricep press down with BTB x 12 reps LE: ambulation x 70 feet to bathroom.   UE: horizontal shoulder ABD with BTB BUE x 12 reps  Education provided throughout session via VC/TC and demonstration to facilitate movement at target joints and correct muscle activation for all testing and exercises performed.        PATIENT EDUCATION: Education details: Exercise technique, progress towards remainder goals Person educated: Patient Education method: Explanation, Demonstration, Tactile cues, and Verbal  cues Education comprehension: verbalized understanding, returned demonstration, verbal cues required, tactile cues required, and needs further education   HOME EXERCISE PROGRAM: No updates   PT Short Term Goals -      PT SHORT TERM GOAL #1   Title Pt will be independent with HEP in order to improve strength and balance in order to decrease fall risk and improve function at home and work.    Baseline 02/02/2021: Patient reports limited HEP in place currently from [facility] agency 12/14 compliance; 1/20/2023Patient states no questions regarding his current exercise regimen and states he is compliant.    Time 6    Period Weeks    Status Achieved    Target Date 03/16/21              PT Long Term Goals -       PT LONG TERM GOAL #1   Title Pt will improve FOTO to target score of 50 to display perceived improvements in ability to complete ADL's.    Baseline 02/02/2021= 43 12/143: 51%; 04/29/2021= 53%; 09/09/2021= 53; 10/12/2021=54%   Time 12    Period Weeks  Status Achieved      PT LONG TERM GOAL #2   Title Pt will decrease 5TSTS by at least 8 seconds in order to demonstrate clinically significant improvement in LE strength.    Baseline 02/02/2021= 28.22 sec with BUE Support 12/14: 23.1 seconds; 04/29/2021=Unable to test today due to patient became nauseous with 6 min walk test and attempted TUG- unable to continue- will reassess next 1-2 visits; 06/08/2021= 25.1 sec with min BUE support; 07/20/2021= 17.75 with min BUE support    Time 12    Period Weeks    Status Achieved    Target Date 07/22/21      PT LONG TERM GOAL #3   Title Pt will decrease TUG to below 19 seconds/decrease in order to demonstrate decreased fall risk.    Baseline 02/02/2021= 27.30 sec without AD 12/14: 23 seconds; 04/29/2021- Unable to assess as patient became very nauseated upon standing and request to stop treatment. 06/08/2021= 19.10 sec without an AD; 07/20/2021= 15.68 sec without UE Support    Time 12    Period Weeks     Status Achieved    Target Date 07/22/21      PT LONG TERM GOAL #4   Title Pt will increase 6MWT by at least 20m(166f in order to demonstrate clinically significant improvement in cardiopulmonary endurance and community ambulation    Baseline 02/02/2021= 83 feet in 1 min 10 sec wihtout an AD 12/14: 200 ft in  58m558mtes; 04/29/2021=Patient ambulated approx 60 feet yet had to stop due onset of nausea and unable to complete today; 07/15/2021= 330 feet in 2 min 25 sec without AD.  09/09/2021= 350 feet in 3 min 45 sec. 10/12/2021= 710 feet in 6 min- no AD (Short stand rest break)    Time 12    Period Weeks    Status Achieved   Target Date 07/22/21      PT LONG TERM GOAL #5   Title Pt will increase 10MWT by at least 0.2 m/s in order to demonstrate clinically significant improvement in community ambulation.    Baseline 02/02/2021= 0.42 m/s 12/14: 0.49 m/s; 04/29/2021=Patient unable to test today secondary to being nauseated. 06/08/2021= 0.62 m/s; 07/20/2021= 0.87 m/s    Time 12    Period Weeks    Status Achieved    Target Date 07/22/21      PT LONG TERM GOAL #6   Title Pt will decrease 5TSTS by at least 3 seconds in order to demonstrate clinically significant improvement in LE strength.    Baseline 07/20/2021= 17.75 sec with Minimal BUE Support; 09/09/2021= 17.50 sec with very minimal UE Support. 10/12/2021= 14.90 sec with min BUE Support. 12/05/2021= 14.30 sec with BUE; 01/09/2022= 23.11 sec with min UE on thighs vs. Arm rests for more emphasis on BLE.  03/15/2022= 17.46 sec without UE Support. 04/11/2022= 15.22 sec without UE support   Time 12    Period Weeks    Status Ongoing   Target Date 07/05/2022   PT LONG TERM GOAL #7 Title:  Pt will increase 6MWT by at least [id number] order to demonstrate clinically significant improvement in cardiopulmonary endurance and community ambulation                                                       Baseline: 10/12/2021= 710 feet; 12/05/2021=  827 feet; 01/09/2022= 620 feet  without an AD - stopping at 4 min mark due to self reported fatigue. 03/15/2022= 1040 feet; 04/13/2022= Patient too fatigued to attempt after other testing. Will assess next visit.  Goal status: ONGOING Target date: 07/05/2022        PT LONG TERM GOAL #8  Title Patient will demonstrate an improved Berg Balance Score of > 50/56 as to demonstrate improved balance with ADLs such as sitting/standing and transfer balance and reduced fall risk.  Baseline 01/09/2022= 47/56; 04/12/2022= 53/56  Time 12   Period Weeks   Status Met  Target Date 04/03/2022   PT LONG TERM GOAL #9  Title Pt will increase BUE shoulder/elbow strength of  by at least 1/2 MMT grade in order to demonstrate improvement in strength and function    Baseline 04/13/2022= BUE = 4/5 throughtout  Time 12   Period Weeks   Status NEW  Target Date 07/05/2022        Plan -      Clinical Impression Statement Patient presents today with good motivation reporting still feeling weak but not as much as last week. Treatment combined some LE strengthening - mostly seated and some postural/UE strengthening. Patient continues to [place] quickly yet able to rebound and participate with rest breaks and add more UE strengthening without significant issues. Pt will continue to benefit from skilled physical therapy intervention to address impairments, improve QOL, and attain therapy goals.     Personal Factors and Comorbidities Comorbidity 3+    Comorbidities DM, Liver transplant, ESRD, CAD, feeding tube    Examination-Activity Limitations Carry;Lift;Reach Overhead;Squat;Stairs;Stand;Transfers;Toileting    Examination-Participation Restrictions Cleaning;Community Activity;Driving;Medication Management;Meal Prep;Yard Work    [employer] Evolving/Moderate complexity    Rehab Potential Fair    PT Frequency 2x / week    PT Duration 12 weeks    PT Treatment/Interventions ADLs/Self Care Home Management;Cryotherapy;Moist Heat;DME  Instruction;Gait training;Stair training;Functional mobility training;Therapeutic activities;Therapeutic exercise;Balance training;Neuromuscular re-education;Patient/family education;Wheelchair mobility training;Manual techniques;Passive range of motion;Dry needling;Energy conservation;Joint Manipulations    PT Next Visit Plan Progressive Therapeutic exercises, Balance training, transfer/gait training    PT Home Exercise Plan No changes or updates today.    Consulted and Agree with Plan of Care Patient              Lewis Moccasin PT  04/24/2022, 4:56 PM

## 2022-04-26 ENCOUNTER — Ambulatory Visit: Payer: Medicare Other

## 2022-04-26 DIAGNOSIS — R2689 Other abnormalities of gait and mobility: Secondary | ICD-10-CM

## 2022-04-26 DIAGNOSIS — R269 Unspecified abnormalities of gait and mobility: Secondary | ICD-10-CM | POA: Diagnosis not present

## 2022-04-26 DIAGNOSIS — M6281 Muscle weakness (generalized): Secondary | ICD-10-CM

## 2022-04-26 DIAGNOSIS — R262 Difficulty in walking, not elsewhere classified: Secondary | ICD-10-CM

## 2022-04-26 DIAGNOSIS — R278 Other lack of coordination: Secondary | ICD-10-CM

## 2022-04-26 DIAGNOSIS — R2681 Unsteadiness on feet: Secondary | ICD-10-CM

## 2022-04-26 DIAGNOSIS — R296 Repeated falls: Secondary | ICD-10-CM

## 2022-04-26 NOTE — Therapy (Signed)
OUTPATIENT PHYSICAL THERAPY TREATMENT NOTE       Patient Name: Alex Hampton MRN: [id number] DOB:01-05-1956, 67 y.o., male Today's Date: 04/27/2022  PCP: Dr. Juluis Pitch REFERRING PROVIDER: Eulis Canner, MD   PT End of Session - 04/26/22 1350     Visit Number 72    Number of Visits 107    Date for PT Re-Evaluation 07/05/22    Authorization Type Medicare/BCBS; PN 03/23/21    Authorization Time Period Initial Cert= 17/00/1749- 44/96/7591; Recert 6/38/4665-9/93/5701: Recert 7/79/3903- 0/0/9233: Recert 0/0/7622- 6/33/3545; Recert 62/08/6387- 37/34/2876    Progress Note Due on Visit 90    PT Start Time 1346    PT Stop Time 1422    PT Time Calculation (min) 36 min    Equipment Utilized During Treatment Gait belt    Activity Tolerance Patient tolerated treatment well    Behavior During Therapy WFL for tasks assessed/performed                               Past Medical History:  Diagnosis Date   Depression    History of cardiac cath    History of heart artery stent    Hyperlipidemia    Hypertension    MI (myocardial infarction) ([place])    Renal disorder    Past Surgical History:  Procedure Laterality Date   IR [place] TUBE CHANGE  11/22/2020   IR [place] TUBE CHANGE  11/26/2020   IR [place] GASTRO/COLONIC TUBE PERCUT W/FLUORO  10/14/2020   LEFT HEART CATH AND CORONARY ANGIOGRAPHY N/A 08/19/2020   Procedure: LEFT HEART CATH AND CORONARY ANGIOGRAPHY;  Surgeon: Corey Skains, MD;  Location: [facility];  Service: Cardiovascular;  Laterality: N/A;   LIVER TRANSPLANT     Patient Active Problem List   Diagnosis Date Noted   Pulmonary embolism ([place]) 08/08/2021   CAP (community acquired pneumonia) 08/08/2021   Protein calorie malnutrition ([place]) 08/08/2021   Hypokalemia 08/08/2021   Pancreatic cyst    Verbal auditory hallucination    Early satiety    Feeding tube dysfunction    Generalized weakness 11/26/2020   Gastrostomy tube  dysfunction ([place]) 11/26/2020   Recurrent falls 11/26/2020   Syncope 10/15/2020   Uremia 10/10/2020   ESRD on hemodialysis ([place]) 10/10/2020   Hyperkalemia 10/10/2020   Elevated troponin 10/10/2020   GERD (gastroesophageal reflux disease) 10/10/2020   CAD (coronary artery disease) 10/10/2020   G tube feedings ([place]) 10/10/2020   Diarrhea 10/10/2020   Diabetes mellitus ([place]) 10/10/2020   Weakness    ACS (acute coronary syndrome) ([place])    Liver transplant recipient [facility])    Anemia of chronic disease    Type 2 diabetes mellitus with diabetic neuropathy, with long-term current use of insulin ([place])    ESRD (end stage renal disease) ([place]) 08/19/2020   History of anemia due to chronic kidney disease 08/19/2020   Gait abnormality 10/17/2019   History of fall within past 90 days 10/17/2019   Visual hallucination 10/17/2019   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 10/17/2019   HTN (hypertension) 10/17/2019   History of MI (myocardial infarction) 10/17/2019   OSA (obstructive sleep apnea) 10/17/2019   Nondisplaced comminuted supracondylar fracture without intercondylar fracture of left humerus, subsequent encounter for fracture with nonunion 10/17/2019   Anxiety and depression 10/17/2019    REFERRING DIAG: Other malaise Z94.4 (ICD-10-CM) - Liver transplant status R26.9 (ICD-10-CM) - Unspecified abnormalities of gait and mobility  THERAPY DIAG:  Difficulty in walking, not elsewhere classified  Abnormality of gait and mobility  Muscle weakness (generalized)  Other abnormalities of gait and mobility  Other lack of coordination  Unsteadiness on feet  Repeated falls  Rationale for Evaluation and Treatment Rehabilitation  PERTINENT HISTORY: Patient is a 67 year old male with recent referral for abnomality of gait and recent liver transplant in November 2021. He has past medical history significant for Liver transplant (02/18/2020), CKD, End stage renal disease- On hemodialyis  T/TH/Sat., Anemia, DM with neuropathy, Non-stemi, Coronary artery disease, Feeding tube with poor appetite, PE now on eliquis   PRECAUTIONS: none  SUBJECTIVE:   I am feeling weak and fatigued today.    PAIN:  Are you having pain? No  Strength on 04/12/2022 R/L 4/4 Shoulder flexion (anterior deltoid/pec major/coracobrachialis, axillary n. (C5-6) and musculocutaneous n. (C5-7)) 4/4 Shoulder abduction (deltoid/supraspinatus, axillary/suprascapular n, C5) 4/4 Shoulder external rotation (infraspinatus/teres minor) 4/4 Shoulder internal rotation (subcapularis/lats/pec major) 4/4 Shoulder extension (posterior deltoid, lats, teres major, axillary/thoracodorsal n.) 4/4 Shoulder horizontal abduction 4/4 Elbow flexion (biceps brachii, brachialis, brachioradialis, musculoskeletal n, C5-6) 4/4 Elbow extension (triceps, radial n, C7)    TODAY'S TREATMENT:  04/26/2022  THEREX:    UE: B shoulder flex to approx 100 deg  overhead  with  2lb PVC bar weight- x 12 reps  x 2 sets   Patient reports as medium.  UE: standing bicep curl with 3lb bar 2 sets of 12 reps LE: Amb - 15 feet down and back with 2.5 AW (high knee march/walk) x 2 LE: AMB - 15 feet down and back with 2.5 AW (ham curl- butt kick walk)  x 1  UE: AAROM shoulder abd using 2lb bar x 12 reps each side LE: Side stepping (15 feet then back) with 2.5 AW x 2  UE: Tricep dumbell kick back 1UE at a time- 2lb x 12 reps LE: toe walk x 15 feet and back x 1  UE: Wall push up using support bar x 12 reps  LE: Seated LAQ 2# AW x 12 reps alt LE  UE: horizontal shoulder ABD with 2lb Dumb bell x 10 reps LE: Ambulation in clinic x 300 feet with 2.5 AW BLE-  O2 sat= 96% and HR= 81 bpm after walk  UE/LE: Sit to stand while holding 2lb bar then performing overhead raise as able- 5 reps and patient too fatigued to complete.    Education provided throughout session via VC/TC and demonstration to facilitate movement at target joints and correct  muscle activation for all testing and exercises performed.        PATIENT EDUCATION: Education details: Exercise technique, progress towards remainder goals Person educated: Patient Education method: Explanation, Demonstration, Tactile cues, and Verbal cues Education comprehension: verbalized understanding, returned demonstration, verbal cues required, tactile cues required, and needs further education   HOME EXERCISE PROGRAM: No updates   PT Short Term Goals -      PT SHORT TERM GOAL #1   Title Pt will be independent with HEP in order to improve strength and balance in order to decrease fall risk and improve function at home and work.    Baseline 02/02/2021: Patient reports limited HEP in place currently from [facility] agency 12/14 compliance; 1/20/2023Patient states no questions regarding his current exercise regimen and states he is compliant.    Time 6    Period Weeks    Status Achieved    Target Date 03/16/21  PT Long Term Goals -       PT LONG TERM GOAL #1   Title Pt will improve FOTO to target score of 50 to display perceived improvements in ability to complete ADL's.    Baseline 02/02/2021= 43 12/143: 51%; 04/29/2021= 53%; 09/09/2021= 53; 10/12/2021=54%   Time 12    Period Weeks    Status Achieved      PT LONG TERM GOAL #2   Title Pt will decrease 5TSTS by at least 8 seconds in order to demonstrate clinically significant improvement in LE strength.    Baseline 02/02/2021= 28.22 sec with BUE Support 12/14: 23.1 seconds; 04/29/2021=Unable to test today due to patient became nauseous with 6 min walk test and attempted TUG- unable to continue- will reassess next 1-2 visits; 06/08/2021= 25.1 sec with min BUE support; 07/20/2021= 17.75 with min BUE support    Time 12    Period Weeks    Status Achieved    Target Date 07/22/21      PT LONG TERM GOAL #3   Title Pt will decrease TUG to below 19 seconds/decrease in order to demonstrate decreased fall risk.    Baseline  02/02/2021= 27.30 sec without AD 12/14: 23 seconds; 04/29/2021- Unable to assess as patient became very nauseated upon standing and request to stop treatment. 06/08/2021= 19.10 sec without an AD; 07/20/2021= 15.68 sec without UE Support    Time 12    Period Weeks    Status Achieved    Target Date 07/22/21      PT LONG TERM GOAL #4   Title Pt will increase 6MWT by at least 75m(1633f in order to demonstrate clinically significant improvement in cardiopulmonary endurance and community ambulation    Baseline 02/02/2021= 83 feet in 1 min 10 sec wihtout an AD 12/14: 200 ft in  68m8mtes; 04/29/2021=Patient ambulated approx 60 feet yet had to stop due onset of nausea and unable to complete today; 07/15/2021= 330 feet in 2 min 25 sec without AD.  09/09/2021= 350 feet in 3 min 45 sec. 10/12/2021= 710 feet in 6 min- no AD (Short stand rest break)    Time 12    Period Weeks    Status Achieved   Target Date 07/22/21      PT LONG TERM GOAL #5   Title Pt will increase 10MWT by at least 0.2 m/s in order to demonstrate clinically significant improvement in community ambulation.    Baseline 02/02/2021= 0.42 m/s 12/14: 0.49 m/s; 04/29/2021=Patient unable to test today secondary to being nauseated. 06/08/2021= 0.62 m/s; 07/20/2021= 0.87 m/s    Time 12    Period Weeks    Status Achieved    Target Date 07/22/21      PT LONG TERM GOAL #6   Title Pt will decrease 5TSTS by at least 3 seconds in order to demonstrate clinically significant improvement in LE strength.    Baseline 07/20/2021= 17.75 sec with Minimal BUE Support; 09/09/2021= 17.50 sec with very minimal UE Support. 10/12/2021= 14.90 sec with min BUE Support. 12/05/2021= 14.30 sec with BUE; 01/09/2022= 23.11 sec with min UE on thighs vs. Arm rests for more emphasis on BLE.  03/15/2022= 17.46 sec without UE Support. 04/11/2022= 15.22 sec without UE support   Time 12    Period Weeks    Status Ongoing   Target Date 07/05/2022   PT LONG TERM GOAL #7 Title:  Pt will increase 6MWT  by at least [id number] order to demonstrate clinically significant improvement in cardiopulmonary  endurance and community ambulation                                                       Baseline: 10/12/2021= 710 feet; 12/05/2021= 827 feet; 01/09/2022= 620 feet without an AD - stopping at 4 min mark due to self reported fatigue. 03/15/2022= 1040 feet; 04/13/2022= Patient too fatigued to attempt after other testing. Will assess next visit.  Goal status: ONGOING Target date: 07/05/2022        PT LONG TERM GOAL #8  Title Patient will demonstrate an improved Berg Balance Score of > 50/56 as to demonstrate improved balance with ADLs such as sitting/standing and transfer balance and reduced fall risk.  Baseline 01/09/2022= 47/56; 04/12/2022= 53/56  Time 12   Period Weeks   Status Met  Target Date 04/03/2022   PT LONG TERM GOAL #9  Title Pt will increase BUE shoulder/elbow strength of  by at least 1/2 MMT grade in order to demonstrate improvement in strength and function    Baseline 04/13/2022= BUE = 4/5 throughtout  Time 12   Period Weeks   Status NEW  Target Date 07/05/2022        Plan -      Clinical Impression Statement Treatment consisted of following Plan of care and continuing to focus on both overall UE/LE strength. Continued with circuit style workout attempting to engage patient with more consistent activity level with less rest while able to shift gears from Upper to lower body focus. He performed well- overall performing more activities and fatigue and decreased functional endurance to activity remain as his most limited factors. Pt will continue to benefit from skilled physical therapy intervention to address impairments, improve QOL, and attain therapy goals.     Personal Factors and Comorbidities Comorbidity 3+    Comorbidities DM, Liver transplant, ESRD, CAD, feeding tube    Examination-Activity Limitations Carry;Lift;Reach Overhead;Squat;Stairs;Stand;Transfers;Toileting     Examination-Participation Restrictions Cleaning;Community Activity;Driving;Medication Management;Meal Prep;Yard Work    [employer] Evolving/Moderate complexity    Rehab Potential Fair    PT Frequency 2x / week    PT Duration 12 weeks    PT Treatment/Interventions ADLs/Self Care Home Management;Cryotherapy;Moist Heat;DME Instruction;Gait training;Stair training;Functional mobility training;Therapeutic activities;Therapeutic exercise;Balance training;Neuromuscular re-education;Patient/family education;Wheelchair mobility training;Manual techniques;Passive range of motion;Dry needling;Energy conservation;Joint Manipulations    PT Next Visit Plan Progressive Therapeutic exercises, Balance training, transfer/gait training    PT Home Exercise Plan No changes or updates today.    Consulted and Agree with Plan of Care Patient              Lewis Moccasin PT  04/27/2022, 9:43 AM

## 2022-05-01 ENCOUNTER — Ambulatory Visit: Payer: Medicare Other

## 2022-05-01 DIAGNOSIS — R262 Difficulty in walking, not elsewhere classified: Secondary | ICD-10-CM

## 2022-05-01 DIAGNOSIS — R269 Unspecified abnormalities of gait and mobility: Secondary | ICD-10-CM

## 2022-05-01 DIAGNOSIS — R2689 Other abnormalities of gait and mobility: Secondary | ICD-10-CM

## 2022-05-01 DIAGNOSIS — M6281 Muscle weakness (generalized): Secondary | ICD-10-CM

## 2022-05-01 NOTE — Therapy (Signed)
OUTPATIENT PHYSICAL THERAPY TREATMENT NOTE       Patient Name: Alex Hampton MRN: [id number] DOB:15-Nov-1955, 67 y.o., male Today's Date: 05/01/2022  PCP: Dr. Juluis Pitch REFERRING PROVIDER: Eulis Canner, MD   PT End of Session - 05/01/22 1343     Visit Number 87    Number of Visits 107    Date for PT Re-Evaluation 07/05/22    Authorization Type Medicare/BCBS; PN 03/23/21    Authorization Time Period Initial Cert= 40/98/1191- 47/82/9562; Recert 05/10/8655-8/46/9629: Recert 09/05/4130- 07/12/100: Recert 10/09/5364- 4/40/3474; Recert 25/12/5636- 75/64/3329    Progress Note Due on Visit 90    PT Start Time 1344    PT Stop Time 1425    PT Time Calculation (min) 41 min    Equipment Utilized During Treatment Gait belt    Activity Tolerance Patient tolerated treatment well    Behavior During Therapy WFL for tasks assessed/performed                               Past Medical History:  Diagnosis Date   Depression    History of cardiac cath    History of heart artery stent    Hyperlipidemia    Hypertension    MI (myocardial infarction) ([place])    Renal disorder    Past Surgical History:  Procedure Laterality Date   IR [place] TUBE CHANGE  11/22/2020   IR [place] TUBE CHANGE  11/26/2020   IR [place] GASTRO/COLONIC TUBE PERCUT W/FLUORO  10/14/2020   LEFT HEART CATH AND CORONARY ANGIOGRAPHY N/A 08/19/2020   Procedure: LEFT HEART CATH AND CORONARY ANGIOGRAPHY;  Surgeon: Corey Skains, MD;  Location: [facility];  Service: Cardiovascular;  Laterality: N/A;   LIVER TRANSPLANT     Patient Active Problem List   Diagnosis Date Noted   Pulmonary embolism ([place]) 08/08/2021   CAP (community acquired pneumonia) 08/08/2021   Protein calorie malnutrition ([place]) 08/08/2021   Hypokalemia 08/08/2021   Pancreatic cyst    Verbal auditory hallucination    Early satiety    Feeding tube dysfunction    Generalized weakness 11/26/2020   Gastrostomy tube  dysfunction ([place]) 11/26/2020   Recurrent falls 11/26/2020   Syncope 10/15/2020   Uremia 10/10/2020   ESRD on hemodialysis ([place]) 10/10/2020   Hyperkalemia 10/10/2020   Elevated troponin 10/10/2020   GERD (gastroesophageal reflux disease) 10/10/2020   CAD (coronary artery disease) 10/10/2020   G tube feedings ([place]) 10/10/2020   Diarrhea 10/10/2020   Diabetes mellitus ([place]) 10/10/2020   Weakness    ACS (acute coronary syndrome) ([place])    Liver transplant recipient [facility])    Anemia of chronic disease    Type 2 diabetes mellitus with diabetic neuropathy, with long-term current use of insulin ([place])    ESRD (end stage renal disease) ([place]) 08/19/2020   History of anemia due to chronic kidney disease 08/19/2020   Gait abnormality 10/17/2019   History of fall within past 90 days 10/17/2019   Visual hallucination 10/17/2019   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 10/17/2019   HTN (hypertension) 10/17/2019   History of MI (myocardial infarction) 10/17/2019   OSA (obstructive sleep apnea) 10/17/2019   Nondisplaced comminuted supracondylar fracture without intercondylar fracture of left humerus, subsequent encounter for fracture with nonunion 10/17/2019   Anxiety and depression 10/17/2019    REFERRING DIAG: Other malaise Z94.4 (ICD-10-CM) - Liver transplant status R26.9 (ICD-10-CM) - Unspecified abnormalities of gait and mobility  THERAPY DIAG:  Difficulty in walking, not elsewhere classified  Abnormality of gait and mobility  Muscle weakness (generalized)  Other abnormalities of gait and mobility  Rationale for Evaluation and Treatment Rehabilitation  PERTINENT HISTORY: Patient is a 67 year old male with recent referral for abnomality of gait and recent liver transplant in November 2021. He has past medical history significant for Liver transplant (02/18/2020), CKD, End stage renal disease- On hemodialyis T/TH/Sat., Anemia, DM with neuropathy, Non-stemi, Coronary artery disease,  Feeding tube with poor appetite, PE now on eliquis   PRECAUTIONS: none  SUBJECTIVE: Pt reports having a good day. Had some soreness from his last session. No falls.    PAIN:  Are you having pain? No  Strength on 04/12/2022 R/L 4/4 Shoulder flexion (anterior deltoid/pec major/coracobrachialis, axillary n. (C5-6) and musculocutaneous n. (C5-7)) 4/4 Shoulder abduction (deltoid/supraspinatus, axillary/suprascapular n, C5) 4/4 Shoulder external rotation (infraspinatus/teres minor) 4/4 Shoulder internal rotation (subcapularis/lats/pec major) 4/4 Shoulder extension (posterior deltoid, lats, teres major, axillary/thoracodorsal n.) 4/4 Shoulder horizontal abduction 4/4 Elbow flexion (biceps brachii, brachialis, brachioradialis, musculoskeletal n, C5-6) 4/4 Elbow extension (triceps, radial n, C7)    TODAY'S TREATMENT: 05/01/2022  THEREX:   UE: B shoulder flex to approx 100 deg  overhead  with 2lb PVC bar weight- 2x12 UE: standing bicep curl with 3lb bar 2 sets of 12 reps  LE: Amb - 15 feet down and back with 2.5 AW (high knee march/walk) x 2 LE: AMB - 15 feet down and back with 2.5 AW (ham curl- butt kick walk)  x 2  UE: AROM shoulder abd using 2lb DB x 12 reps/side LE: Side stepping (15 feet then back) with 2.5 AW x 4  UE: Tricep dumbell kick back 1UE at a time- 2lb x 12 reps LE: toe walk x 15 feet and back x 1  UE: Wall push up using support bar x 12 reps  LE: Seated LAQ 2.5# AW x 12 reps/LE  UE: horizontal shoulder ABD with 2lb Dumb bell x 12 reps/UE LE: Ambulation in clinic x 300 feet with 2.5 AW BLE, SBA  UE/LE: Sit to stand while holding 2lb bar then performing overhead raise as able- 5 reps after PT demo with excellent carryover.      Education provided throughout session via VC/TC and demonstration to facilitate movement at target joints and correct muscle activation for all testing and exercises performed.        PATIENT EDUCATION: Education details: Exercise  technique, progress towards remainder goals Person educated: Patient Education method: Explanation, Demonstration, Tactile cues, and Verbal cues Education comprehension: verbalized understanding, returned demonstration, verbal cues required, tactile cues required, and needs further education   HOME EXERCISE PROGRAM: No updates   PT Short Term Goals -      PT SHORT TERM GOAL #1   Title Pt will be independent with HEP in order to improve strength and balance in order to decrease fall risk and improve function at home and work.    Baseline 02/02/2021: Patient reports limited HEP in place currently from [facility] agency 12/14 compliance; 1/20/2023Patient states no questions regarding his current exercise regimen and states he is compliant.    Time 6    Period Weeks    Status Achieved    Target Date 03/16/21              PT Long Term Goals -       PT LONG TERM GOAL #1   Title Pt will improve FOTO to target score  of 50 to display perceived improvements in ability to complete ADL's.    Baseline 02/02/2021= 43 12/143: 51%; 04/29/2021= 53%; 09/09/2021= 53; 10/12/2021=54%   Time 12    Period Weeks    Status Achieved      PT LONG TERM GOAL #2   Title Pt will decrease 5TSTS by at least 8 seconds in order to demonstrate clinically significant improvement in LE strength.    Baseline 02/02/2021= 28.22 sec with BUE Support 12/14: 23.1 seconds; 04/29/2021=Unable to test today due to patient became nauseous with 6 min walk test and attempted TUG- unable to continue- will reassess next 1-2 visits; 06/08/2021= 25.1 sec with min BUE support; 07/20/2021= 17.75 with min BUE support    Time 12    Period Weeks    Status Achieved    Target Date 07/22/21      PT LONG TERM GOAL #3   Title Pt will decrease TUG to below 19 seconds/decrease in order to demonstrate decreased fall risk.    Baseline 02/02/2021= 27.30 sec without AD 12/14: 23 seconds; 04/29/2021- Unable to assess as patient became very nauseated upon  standing and request to stop treatment. 06/08/2021= 19.10 sec without an AD; 07/20/2021= 15.68 sec without UE Support    Time 12    Period Weeks    Status Achieved    Target Date 07/22/21      PT LONG TERM GOAL #4   Title Pt will increase 6MWT by at least 22m(1624f in order to demonstrate clinically significant improvement in cardiopulmonary endurance and community ambulation    Baseline 02/02/2021= 83 feet in 1 min 10 sec wihtout an AD 12/14: 200 ft in  68m55mtes; 04/29/2021=Patient ambulated approx 60 feet yet had to stop due onset of nausea and unable to complete today; 07/15/2021= 330 feet in 2 min 25 sec without AD.  09/09/2021= 350 feet in 3 min 45 sec. 10/12/2021= 710 feet in 6 min- no AD (Short stand rest break)    Time 12    Period Weeks    Status Achieved   Target Date 07/22/21      PT LONG TERM GOAL #5   Title Pt will increase 10MWT by at least 0.2 m/s in order to demonstrate clinically significant improvement in community ambulation.    Baseline 02/02/2021= 0.42 m/s 12/14: 0.49 m/s; 04/29/2021=Patient unable to test today secondary to being nauseated. 06/08/2021= 0.62 m/s; 07/20/2021= 0.87 m/s    Time 12    Period Weeks    Status Achieved    Target Date 07/22/21      PT LONG TERM GOAL #6   Title Pt will decrease 5TSTS by at least 3 seconds in order to demonstrate clinically significant improvement in LE strength.    Baseline 07/20/2021= 17.75 sec with Minimal BUE Support; 09/09/2021= 17.50 sec with very minimal UE Support. 10/12/2021= 14.90 sec with min BUE Support. 12/05/2021= 14.30 sec with BUE; 01/09/2022= 23.11 sec with min UE on thighs vs. Arm rests for more emphasis on BLE.  03/15/2022= 17.46 sec without UE Support. 04/11/2022= 15.22 sec without UE support   Time 12    Period Weeks    Status Ongoing   Target Date 07/05/2022   PT LONG TERM GOAL #7 Title:  Pt will increase 6MWT by at least [id number] order to demonstrate clinically significant improvement in cardiopulmonary endurance and  community ambulation  Baseline: 10/12/2021= 710 feet; 12/05/2021= 827 feet; 01/09/2022= 620 feet without an AD - stopping at 4 min mark due to self reported fatigue. 03/15/2022= 1040 feet; 04/13/2022= Patient too fatigued to attempt after other testing. Will assess next visit.  Goal status: ONGOING Target date: 07/05/2022        PT LONG TERM GOAL #8  Title Patient will demonstrate an improved Berg Balance Score of > 50/56 as to demonstrate improved balance with ADLs such as sitting/standing and transfer balance and reduced fall risk.  Baseline 01/09/2022= 47/56; 04/12/2022= 53/56  Time 12   Period Weeks   Status Met  Target Date 04/03/2022   PT LONG TERM GOAL #9  Title Pt will increase BUE shoulder/elbow strength of  by at least 1/2 MMT grade in order to demonstrate improvement in strength and function    Baseline 04/13/2022= BUE = 4/5 throughtout  Time 12   Period Weeks   Status NEW  Target Date 07/05/2022        Plan -      Clinical Impression Statement Continuing primary PT POC with UE/LE strengthening. Continuing circuit work outs to limit seated rest breaks by increasing tolerance for physical activity. Pt with excellent understanding of therex but does become fatigued by end of session. Pt will continue to benefit from skilled physical therapy intervention to address impairments, improve QOL, and attain therapy goals.     Personal Factors and Comorbidities Comorbidity 3+    Comorbidities DM, Liver transplant, ESRD, CAD, feeding tube    Examination-Activity Limitations Carry;Lift;Reach Overhead;Squat;Stairs;Stand;Transfers;Toileting    Examination-Participation Restrictions Cleaning;Community Activity;Driving;Medication Management;Meal Prep;Yard Work    [employer] Evolving/Moderate complexity    Rehab Potential Fair    PT Frequency 2x / week    PT Duration 12 weeks    PT Treatment/Interventions ADLs/Self Care  Home Management;Cryotherapy;Moist Heat;DME Instruction;Gait training;Stair training;Functional mobility training;Therapeutic activities;Therapeutic exercise;Balance training;Neuromuscular re-education;Patient/family education;Wheelchair mobility training;Manual techniques;Passive range of motion;Dry needling;Energy conservation;Joint Manipulations    PT Next Visit Plan Progressive Therapeutic exercises, Balance training, transfer/gait training    PT Home Exercise Plan No changes or updates today.    Consulted and Agree with Plan of Care Patient              Salem Caster. Fairly IV, PT, DPT Physical Therapist- [facility]  05/01/2022, 2:41 PM

## 2022-05-03 ENCOUNTER — Ambulatory Visit: Payer: Medicare Other

## 2022-05-03 DIAGNOSIS — R2689 Other abnormalities of gait and mobility: Secondary | ICD-10-CM

## 2022-05-03 DIAGNOSIS — M6281 Muscle weakness (generalized): Secondary | ICD-10-CM

## 2022-05-03 DIAGNOSIS — R278 Other lack of coordination: Secondary | ICD-10-CM

## 2022-05-03 DIAGNOSIS — R262 Difficulty in walking, not elsewhere classified: Secondary | ICD-10-CM

## 2022-05-03 DIAGNOSIS — R269 Unspecified abnormalities of gait and mobility: Secondary | ICD-10-CM

## 2022-05-03 NOTE — Therapy (Signed)
OUTPATIENT PHYSICAL THERAPY TREATMENT NOTE       Patient Name: Alex Hampton MRN: [id number] DOB:[birth year], 67 y.o., male Today's Date: 05/03/2022  PCP: Dr. Juluis Pitch REFERRING PROVIDER: Eulis Canner, MD   PT End of Session - 05/03/22 1422     Visit Number 88    Number of Visits 107    Date for PT Re-Evaluation 07/05/22    Authorization Type Medicare/BCBS; PN 03/23/21    Authorization Time Period Initial Cert= 95/18/8416- 60/63/0160; Recert 04/18/3233-5/73/2202: Recert 5/42/7062- 06/14/6281: Recert 04/15/1759- 09/14/3708; Recert 62/09/9483- 46/27/0350    Progress Note Due on Visit 90    PT Start Time 1345    PT Stop Time 1416    PT Time Calculation (min) 31 min    Equipment Utilized During Treatment Gait belt    Activity Tolerance Patient tolerated treatment well    Behavior During Therapy WFL for tasks assessed/performed                                Past Medical History:  Diagnosis Date   Depression    History of cardiac cath    History of heart artery stent    Hyperlipidemia    Hypertension    MI (myocardial infarction) ([place])    Renal disorder    Past Surgical History:  Procedure Laterality Date   IR [place] TUBE CHANGE  11/22/2020   IR [place] TUBE CHANGE  11/26/2020   IR [place] GASTRO/COLONIC TUBE PERCUT W/FLUORO  10/14/2020   LEFT HEART CATH AND CORONARY ANGIOGRAPHY N/A 08/19/2020   Procedure: LEFT HEART CATH AND CORONARY ANGIOGRAPHY;  Surgeon: Corey Skains, MD;  Location: [facility];  Service: Cardiovascular;  Laterality: N/A;   LIVER TRANSPLANT     Patient Active Problem List   Diagnosis Date Noted   Pulmonary embolism ([place]) 08/08/2021   CAP (community acquired pneumonia) 08/08/2021   Protein calorie malnutrition ([place]) 08/08/2021   Hypokalemia 08/08/2021   Pancreatic cyst    Verbal auditory hallucination    Early satiety    Feeding tube dysfunction    Generalized weakness 11/26/2020   Gastrostomy tube  dysfunction ([place]) 11/26/2020   Recurrent falls 11/26/2020   Syncope 10/15/2020   Uremia 10/10/2020   ESRD on hemodialysis ([place]) 10/10/2020   Hyperkalemia 10/10/2020   Elevated troponin 10/10/2020   GERD (gastroesophageal reflux disease) 10/10/2020   CAD (coronary artery disease) 10/10/2020   G tube feedings ([place]) 10/10/2020   Diarrhea 10/10/2020   Diabetes mellitus ([place]) 10/10/2020   Weakness    ACS (acute coronary syndrome) ([place])    Liver transplant recipient [facility])    Anemia of chronic disease    Type 2 diabetes mellitus with diabetic neuropathy, with long-term current use of insulin ([place])    ESRD (end stage renal disease) ([place]) 08/19/2020   History of anemia due to chronic kidney disease 08/19/2020   Gait abnormality 10/17/2019   History of fall within past 90 days 10/17/2019   Visual hallucination 10/17/2019   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 10/17/2019   HTN (hypertension) 10/17/2019   History of MI (myocardial infarction) 10/17/2019   OSA (obstructive sleep apnea) 10/17/2019   Nondisplaced comminuted supracondylar fracture without intercondylar fracture of left humerus, subsequent encounter for fracture with nonunion 10/17/2019   Anxiety and depression 10/17/2019    REFERRING DIAG: Other malaise Z94.4 (ICD-10-CM) - Liver transplant status R26.9 (ICD-10-CM) - Unspecified abnormalities of gait and  mobility   THERAPY DIAG:  Difficulty in walking, not elsewhere classified  Muscle weakness (generalized)  Abnormality of gait and mobility  Other abnormalities of gait and mobility  Other lack of coordination  Rationale for Evaluation and Treatment Rehabilitation  PERTINENT HISTORY: Patient is a 67 year old male with recent referral for abnomality of gait and recent liver transplant in November 2021. He has past medical history significant for Liver transplant (02/18/2020), CKD, End stage renal disease- On hemodialyis T/TH/Sat., Anemia, DM with neuropathy,  Non-stemi, Coronary artery disease, Feeding tube with poor appetite, PE now on eliquis   PRECAUTIONS: none  SUBJECTIVE: Pt reports no new concerns or complaints. Some UE soreness in tricep region from Monday.    PAIN:  Are you having pain? No  Strength on 04/12/2022 R/L 4/4 Shoulder flexion (anterior deltoid/pec major/coracobrachialis, axillary n. (C5-6) and musculocutaneous n. (C5-7)) 4/4 Shoulder abduction (deltoid/supraspinatus, axillary/suprascapular n, C5) 4/4 Shoulder external rotation (infraspinatus/teres minor) 4/4 Shoulder internal rotation (subcapularis/lats/pec major) 4/4 Shoulder extension (posterior deltoid, lats, teres major, axillary/thoracodorsal n.) 4/4 Shoulder horizontal abduction 4/4 Elbow flexion (biceps brachii, brachialis, brachioradialis, musculoskeletal n, C5-6) 4/4 Elbow extension (triceps, radial n, C7)    TODAY'S TREATMENT: 05/01/2022  THEREX:   UE: B shoulder flex to approx 100 deg  overhead  with 3 lb PVC bar weight- 1x12, 1x10 on second set. UE: standing bicep curl with 4 lb bar 2 sets of 12 reps  LE: Amb - 15 feet down and back with 2.5 AW (high knee march/walk) x 2 LE: AMB - 15 feet down and back with 2.5 AW (ham curl- butt kick walk)  x 2  UE: AROM shoulder abd using 3 lb DB 2 x 12 reps/side LE: Side stepping (15 feet then back) with 2.5 AW x 4  UE: Tricep dumbell kick back 1UE at a time- 2lb 2 x 12 reps LE: toe walk x 15 feet and back 2x2 laps   UE: Wall push up using support bar 2 x 12 reps  LE: Seated LAQ 3# AW 2 x 12 reps/LE  UE: horizontal shoulder ABD with 3 lb Dumb bell x 12 reps/UE LE: Ambulation in clinic x 300 feet with 3# AW BLE, SBA   Education provided throughout session via VC/TC and demonstration to facilitate movement at target joints and correct muscle activation for all testing and exercises performed.        PATIENT EDUCATION: Education details: Exercise technique, progress towards remainder goals Person  educated: Patient Education method: Explanation, Demonstration, Tactile cues, and Verbal cues Education comprehension: verbalized understanding, returned demonstration, verbal cues required, tactile cues required, and needs further education   HOME EXERCISE PROGRAM: No updates   PT Short Term Goals -      PT SHORT TERM GOAL #1   Title Pt will be independent with HEP in order to improve strength and balance in order to decrease fall risk and improve function at home and work.    Baseline 02/02/2021: Patient reports limited HEP in place currently from [facility] agency 12/14 compliance; 1/20/2023Patient states no questions regarding his current exercise regimen and states he is compliant.    Time 6    Period Weeks    Status Achieved    Target Date 03/16/21              PT Long Term Goals -       PT LONG TERM GOAL #1   Title Pt will improve FOTO to target score of 50 to display perceived  improvements in ability to complete ADL's.    Baseline 02/02/2021= 43 12/143: 51%; 04/29/2021= 53%; 09/09/2021= 53; 10/12/2021=54%   Time 12    Period Weeks    Status Achieved      PT LONG TERM GOAL #2   Title Pt will decrease 5TSTS by at least 8 seconds in order to demonstrate clinically significant improvement in LE strength.    Baseline 02/02/2021= 28.22 sec with BUE Support 12/14: 23.1 seconds; 04/29/2021=Unable to test today due to patient became nauseous with 6 min walk test and attempted TUG- unable to continue- will reassess next 1-2 visits; 06/08/2021= 25.1 sec with min BUE support; 07/20/2021= 17.75 with min BUE support    Time 12    Period Weeks    Status Achieved    Target Date 07/22/21      PT LONG TERM GOAL #3   Title Pt will decrease TUG to below 19 seconds/decrease in order to demonstrate decreased fall risk.    Baseline 02/02/2021= 27.30 sec without AD 12/14: 23 seconds; 04/29/2021- Unable to assess as patient became very nauseated upon standing and request to stop treatment. 06/08/2021= 19.10  sec without an AD; 07/20/2021= 15.68 sec without UE Support    Time 12    Period Weeks    Status Achieved    Target Date 07/22/21      PT LONG TERM GOAL #4   Title Pt will increase 6MWT by at least 42m(1674f in order to demonstrate clinically significant improvement in cardiopulmonary endurance and community ambulation    Baseline 02/02/2021= 83 feet in 1 min 10 sec wihtout an AD 12/14: 200 ft in  68m81mtes; 04/29/2021=Patient ambulated approx 60 feet yet had to stop due onset of nausea and unable to complete today; 07/15/2021= 330 feet in 2 min 25 sec without AD.  09/09/2021= 350 feet in 3 min 45 sec. 10/12/2021= 710 feet in 6 min- no AD (Short stand rest break)    Time 12    Period Weeks    Status Achieved   Target Date 07/22/21      PT LONG TERM GOAL #5   Title Pt will increase 10MWT by at least 0.2 m/s in order to demonstrate clinically significant improvement in community ambulation.    Baseline 02/02/2021= 0.42 m/s 12/14: 0.49 m/s; 04/29/2021=Patient unable to test today secondary to being nauseated. 06/08/2021= 0.62 m/s; 07/20/2021= 0.87 m/s    Time 12    Period Weeks    Status Achieved    Target Date 07/22/21      PT LONG TERM GOAL #6   Title Pt will decrease 5TSTS by at least 3 seconds in order to demonstrate clinically significant improvement in LE strength.    Baseline 07/20/2021= 17.75 sec with Minimal BUE Support; 09/09/2021= 17.50 sec with very minimal UE Support. 10/12/2021= 14.90 sec with min BUE Support. 12/05/2021= 14.30 sec with BUE; 01/09/2022= 23.11 sec with min UE on thighs vs. Arm rests for more emphasis on BLE.  03/15/2022= 17.46 sec without UE Support. 04/11/2022= 15.22 sec without UE support   Time 12    Period Weeks    Status Ongoing   Target Date 07/05/2022   PT LONG TERM GOAL #7 Title:  Pt will increase 6MWT by at least [id number] order to demonstrate clinically significant improvement in cardiopulmonary endurance and community ambulation  Baseline: 10/12/2021= 710 feet; 12/05/2021= 827 feet; 01/09/2022= 620 feet without an AD - stopping at 4 min mark due to self reported fatigue. 03/15/2022= 1040 feet; 04/13/2022= Patient too fatigued to attempt after other testing. Will assess next visit.  Goal status: ONGOING Target date: 07/05/2022        PT LONG TERM GOAL #8  Title Patient will demonstrate an improved Berg Balance Score of > 50/56 as to demonstrate improved balance with ADLs such as sitting/standing and transfer balance and reduced fall risk.  Baseline 01/09/2022= 47/56; 04/12/2022= 53/56  Time 12   Period Weeks   Status Met  Target Date 04/03/2022   PT LONG TERM GOAL #9  Title Pt will increase BUE shoulder/elbow strength of  by at least 1/2 MMT grade in order to demonstrate improvement in strength and function    Baseline 04/13/2022= BUE = 4/5 throughtout  Time 12   Period Weeks   Status NEW  Target Date 07/05/2022        Plan -      Clinical Impression Statement Continuing primary PT POC with UE/LE strengthening. Pt tolerating increased resistance for UE/LE exercise and increased volume compared to previous sessions without difficulty but at end of resisted gait pt does endorse onset of musculare fatigue requesting to end session early. Pt continues to have good understanding with all exercises, some difficulty with elevating LUE due to previous shoulder injury but without pain. Pt will continue to benefit from skilled physical therapy intervention to address impairments, improve QOL, and attain therapy goals.     Personal Factors and Comorbidities Comorbidity 3+    Comorbidities DM, Liver transplant, ESRD, CAD, feeding tube    Examination-Activity Limitations Carry;Lift;Reach Overhead;Squat;Stairs;Stand;Transfers;Toileting    Examination-Participation Restrictions Cleaning;Community Activity;Driving;Medication Management;Meal Prep;Yard Work    [employer] Evolving/Moderate complexity     Rehab Potential Fair    PT Frequency 2x / week    PT Duration 12 weeks    PT Treatment/Interventions ADLs/Self Care Home Management;Cryotherapy;Moist Heat;DME Instruction;Gait training;Stair training;Functional mobility training;Therapeutic activities;Therapeutic exercise;Balance training;Neuromuscular re-education;Patient/family education;Wheelchair mobility training;Manual techniques;Passive range of motion;Dry needling;Energy conservation;Joint Manipulations    PT Next Visit Plan Progressive Therapeutic exercises, Balance training, transfer/gait training    PT Home Exercise Plan No changes or updates today.    Consulted and Agree with Plan of Care Patient              Salem Caster. Fairly IV, PT, DPT Physical Therapist- [facility]  05/03/2022, 2:43 PM

## 2022-05-08 ENCOUNTER — Ambulatory Visit: Payer: Medicare Other

## 2022-05-10 ENCOUNTER — Ambulatory Visit: Payer: Medicare Other

## 2022-05-10 DIAGNOSIS — R269 Unspecified abnormalities of gait and mobility: Secondary | ICD-10-CM | POA: Diagnosis not present

## 2022-05-10 DIAGNOSIS — R262 Difficulty in walking, not elsewhere classified: Secondary | ICD-10-CM

## 2022-05-10 DIAGNOSIS — M6281 Muscle weakness (generalized): Secondary | ICD-10-CM

## 2022-05-10 DIAGNOSIS — R278 Other lack of coordination: Secondary | ICD-10-CM

## 2022-05-10 DIAGNOSIS — R2689 Other abnormalities of gait and mobility: Secondary | ICD-10-CM

## 2022-05-10 DIAGNOSIS — R2681 Unsteadiness on feet: Secondary | ICD-10-CM

## 2022-05-10 NOTE — Therapy (Signed)
OUTPATIENT PHYSICAL THERAPY TREATMENT NOTE       Patient Name: Alex Hampton MRN: [id number] DOB:Aug 07, 1955, 67 y.o., male Today's Date: 05/10/2022  PCP: Dr. Juluis Pitch REFERRING PROVIDER: Eulis Canner, MD   PT End of Session - 05/10/22 1338     Visit Number 89    Number of Visits 107    Date for PT Re-Evaluation 07/05/22    Authorization Type Medicare/BCBS; PN 03/23/21    Authorization Time Period Initial Cert= 94/85/4627- 03/50/0938; Recert 1/82/9937-1/69/6789: Recert 3/81/0175- 1/0/2585: Recert 05/17/7822- 2/35/3614; Recert 43/04/5398- 86/76/1950    Progress Note Due on Visit 90    PT Start Time 1339    PT Stop Time 1413    PT Time Calculation (min) 34 min    Equipment Utilized During Treatment Gait belt    Activity Tolerance Patient tolerated treatment well    Behavior During Therapy WFL for tasks assessed/performed                                Past Medical History:  Diagnosis Date   Depression    History of cardiac cath    History of heart artery stent    Hyperlipidemia    Hypertension    MI (myocardial infarction) ([place])    Renal disorder    Past Surgical History:  Procedure Laterality Date   IR [place] TUBE CHANGE  11/22/2020   IR [place] TUBE CHANGE  11/26/2020   IR [place] GASTRO/COLONIC TUBE PERCUT W/FLUORO  10/14/2020   LEFT HEART CATH AND CORONARY ANGIOGRAPHY N/A 08/19/2020   Procedure: LEFT HEART CATH AND CORONARY ANGIOGRAPHY;  Surgeon: Corey Skains, MD;  Location: [facility];  Service: Cardiovascular;  Laterality: N/A;   LIVER TRANSPLANT     Patient Active Problem List   Diagnosis Date Noted   Pulmonary embolism ([place]) 08/08/2021   CAP (community acquired pneumonia) 08/08/2021   Protein calorie malnutrition ([place]) 08/08/2021   Hypokalemia 08/08/2021   Pancreatic cyst    Verbal auditory hallucination    Early satiety    Feeding tube dysfunction    Generalized weakness 11/26/2020   Gastrostomy tube  dysfunction ([place]) 11/26/2020   Recurrent falls 11/26/2020   Syncope 10/15/2020   Uremia 10/10/2020   ESRD on hemodialysis ([place]) 10/10/2020   Hyperkalemia 10/10/2020   Elevated troponin 10/10/2020   GERD (gastroesophageal reflux disease) 10/10/2020   CAD (coronary artery disease) 10/10/2020   G tube feedings ([place]) 10/10/2020   Diarrhea 10/10/2020   Diabetes mellitus ([place]) 10/10/2020   Weakness    ACS (acute coronary syndrome) ([place])    Liver transplant recipient [facility])    Anemia of chronic disease    Type 2 diabetes mellitus with diabetic neuropathy, with long-term current use of insulin ([place])    ESRD (end stage renal disease) ([place]) 08/19/2020   History of anemia due to chronic kidney disease 08/19/2020   Gait abnormality 10/17/2019   History of fall within past 90 days 10/17/2019   Visual hallucination 10/17/2019   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 10/17/2019   HTN (hypertension) 10/17/2019   History of MI (myocardial infarction) 10/17/2019   OSA (obstructive sleep apnea) 10/17/2019   Nondisplaced comminuted supracondylar fracture without intercondylar fracture of left humerus, subsequent encounter for fracture with nonunion 10/17/2019   Anxiety and depression 10/17/2019    REFERRING DIAG: Other malaise Z94.4 (ICD-10-CM) - Liver transplant status R26.9 (ICD-10-CM) - Unspecified abnormalities of gait and  mobility   THERAPY DIAG:  Difficulty in walking, not elsewhere classified  Muscle weakness (generalized)  Abnormality of gait and mobility  Other abnormalities of gait and mobility  Other lack of coordination  Unsteadiness on feet  Rationale for Evaluation and Treatment Rehabilitation  PERTINENT HISTORY: Patient is a 67 year old male with recent referral for abnomality of gait and recent liver transplant in November 2021. He has past medical history significant for Liver transplant (02/18/2020), CKD, End stage renal disease- On hemodialyis T/TH/Sat., Anemia, DM  with neuropathy, Non-stemi, Coronary artery disease, Feeding tube with poor appetite, PE now on eliquis   PRECAUTIONS: none  SUBJECTIVE: Pt reports sore right arm from RSV vaccine last Friday. Patient denies any other pain or falls.    PAIN:  Are you having pain? No  Strength on 04/12/2022 R/L 4/4 Shoulder flexion (anterior deltoid/pec major/coracobrachialis, axillary n. (C5-6) and musculocutaneous n. (C5-7)) 4/4 Shoulder abduction (deltoid/supraspinatus, axillary/suprascapular n, C5) 4/4 Shoulder external rotation (infraspinatus/teres minor) 4/4 Shoulder internal rotation (subcapularis/lats/pec major) 4/4 Shoulder extension (posterior deltoid, lats, teres major, axillary/thoracodorsal n.) 4/4 Shoulder horizontal abduction 4/4 Elbow flexion (biceps brachii, brachialis, brachioradialis, musculoskeletal n, C5-6) 4/4 Elbow extension (triceps, radial n, C7)    TODAY'S TREATMENT: 05/10/2022  THEREX:   Lower body: LAQ with 3# AW alt LE  Upper body: Dumb bell curls 4#  *superset - 2 sets of 12 reps  Ambulation - 75 feet without an AD- RPE= 5/10  Lower body: Deadlifts with 4lb Dumb bell Upper body: shoulder flex with 3.5 PVC pipe *superset - 2 sets of 12 reps  Ambulation- 75 feet without an AD- RPE= 4-5/10   Upper Body: AROM shoulder abd using 4 lb DB  Lower Body: Side stepping (15 feet then back) with 3# AW  *superset 2 sets of 12 reps   Ambulation- 75 feet without an AD-  Patient stopped stating he was too fatigued to continue.   Education provided throughout session via VC/TC and demonstration to facilitate movement at target joints and correct muscle activation for all testing and exercises performed.        PATIENT EDUCATION: Education details: Exercise technique, progress towards remainder goals Person educated: Patient Education method: Explanation, Demonstration, Tactile cues, and Verbal cues Education comprehension: verbalized understanding, returned  demonstration, verbal cues required, tactile cues required, and needs further education   HOME EXERCISE PROGRAM: No updates   PT Short Term Goals -      PT SHORT TERM GOAL #1   Title Pt will be independent with HEP in order to improve strength and balance in order to decrease fall risk and improve function at home and work.    Baseline 02/02/2021: Patient reports limited HEP in place currently from [facility] agency 12/14 compliance; 1/20/2023Patient states no questions regarding his current exercise regimen and states he is compliant.    Time 6    Period Weeks    Status Achieved    Target Date 03/16/21              PT Long Term Goals -       PT LONG TERM GOAL #1   Title Pt will improve FOTO to target score of 50 to display perceived improvements in ability to complete ADL's.    Baseline 02/02/2021= 43 12/143: 51%; 04/29/2021= 53%; 09/09/2021= 53; 10/12/2021=54%   Time 12    Period Weeks    Status Achieved      PT LONG TERM GOAL #2   Title Pt will decrease 5TSTS  by at least 8 seconds in order to demonstrate clinically significant improvement in LE strength.    Baseline 02/02/2021= 28.22 sec with BUE Support 12/14: 23.1 seconds; 04/29/2021=Unable to test today due to patient became nauseous with 6 min walk test and attempted TUG- unable to continue- will reassess next 1-2 visits; 06/08/2021= 25.1 sec with min BUE support; 07/20/2021= 17.75 with min BUE support    Time 12    Period Weeks    Status Achieved    Target Date 07/22/21      PT LONG TERM GOAL #3   Title Pt will decrease TUG to below 19 seconds/decrease in order to demonstrate decreased fall risk.    Baseline 02/02/2021= 27.30 sec without AD 12/14: 23 seconds; 04/29/2021- Unable to assess as patient became very nauseated upon standing and request to stop treatment. 06/08/2021= 19.10 sec without an AD; 07/20/2021= 15.68 sec without UE Support    Time 12    Period Weeks    Status Achieved    Target Date 07/22/21      PT LONG TERM  GOAL #4   Title Pt will increase 6MWT by at least 31m(1662f in order to demonstrate clinically significant improvement in cardiopulmonary endurance and community ambulation    Baseline 02/02/2021= 83 feet in 1 min 10 sec wihtout an AD 12/14: 200 ft in  22m66mtes; 04/29/2021=Patient ambulated approx 60 feet yet had to stop due onset of nausea and unable to complete today; 07/15/2021= 330 feet in 2 min 25 sec without AD.  09/09/2021= 350 feet in 3 min 45 sec. 10/12/2021= 710 feet in 6 min- no AD (Short stand rest break)    Time 12    Period Weeks    Status Achieved   Target Date 07/22/21      PT LONG TERM GOAL #5   Title Pt will increase 10MWT by at least 0.2 m/s in order to demonstrate clinically significant improvement in community ambulation.    Baseline 02/02/2021= 0.42 m/s 12/14: 0.49 m/s; 04/29/2021=Patient unable to test today secondary to being nauseated. 06/08/2021= 0.62 m/s; 07/20/2021= 0.87 m/s    Time 12    Period Weeks    Status Achieved    Target Date 07/22/21      PT LONG TERM GOAL #6   Title Pt will decrease 5TSTS by at least 3 seconds in order to demonstrate clinically significant improvement in LE strength.    Baseline 07/20/2021= 17.75 sec with Minimal BUE Support; 09/09/2021= 17.50 sec with very minimal UE Support. 10/12/2021= 14.90 sec with min BUE Support. 12/05/2021= 14.30 sec with BUE; 01/09/2022= 23.11 sec with min UE on thighs vs. Arm rests for more emphasis on BLE.  03/15/2022= 17.46 sec without UE Support. 04/11/2022= 15.22 sec without UE support   Time 12    Period Weeks    Status Ongoing   Target Date 07/05/2022   PT LONG TERM GOAL #7 Title:  Pt will increase 6MWT by at least [id number] order to demonstrate clinically significant improvement in cardiopulmonary endurance and community ambulation                                                       Baseline: 10/12/2021= 710 feet; 12/05/2021= 827 feet; 01/09/2022= 620 feet without an AD - stopping at 4 min mark due to self reported  fatigue. 03/15/2022= 1040 feet; 04/13/2022= Patient too fatigued to attempt after other testing. Will assess next visit.  Goal status: ONGOING Target date: 07/05/2022        PT LONG TERM GOAL #8  Title Patient will demonstrate an improved Berg Balance Score of > 50/56 as to demonstrate improved balance with ADLs such as sitting/standing and transfer balance and reduced fall risk.  Baseline 01/09/2022= 47/56; 04/12/2022= 53/56  Time 12   Period Weeks   Status Met  Target Date 04/03/2022   PT LONG TERM GOAL #9  Title Pt will increase BUE shoulder/elbow strength of  by at least 1/2 MMT grade in order to demonstrate improvement in strength and function    Baseline 04/13/2022= BUE = 4/5 throughtout  Time 12   Period Weeks   Status NEW  Target Date 07/05/2022        Plan -      Clinical Impression Statement Continuing primary PT POC with UE/LE strengthening. Patient was performing well with circuit style workout using rest breaks and RPE score to guide workout. He abruptly stopped session stating he was too fatigued to continue. When asked what happened he just responded just got overwhelming fatigue. Will continue to closely monitor him and adjust sessions as needed. Pt will continue to benefit from skilled physical therapy intervention to address impairments, improve QOL, and attain therapy goals.     Personal Factors and Comorbidities Comorbidity 3+    Comorbidities DM, Liver transplant, ESRD, CAD, feeding tube    Examination-Activity Limitations Carry;Lift;Reach Overhead;Squat;Stairs;Stand;Transfers;Toileting    Examination-Participation Restrictions Cleaning;Community Activity;Driving;Medication Management;Meal Prep;Yard Work    [employer] Evolving/Moderate complexity    Rehab Potential Fair    PT Frequency 2x / week    PT Duration 12 weeks    PT Treatment/Interventions ADLs/Self Care Home Management;Cryotherapy;Moist Heat;DME Instruction;Gait training;Stair  training;Functional mobility training;Therapeutic activities;Therapeutic exercise;Balance training;Neuromuscular re-education;Patient/family education;Wheelchair mobility training;Manual techniques;Passive range of motion;Dry needling;Energy conservation;Joint Manipulations    PT Next Visit Plan Progressive Therapeutic exercises, Balance training, transfer/gait training. Plan for Progress note visit next visit.    PT Home Exercise Plan No changes or updates today.    Consulted and Agree with Plan of Care Patient             Ollen Bowl, PT Physical Therapist- [place]  [facility]  05/10/2022, 2:16 PM

## 2022-05-15 ENCOUNTER — Ambulatory Visit: Payer: Medicare Other | Attending: Gastroenterology

## 2022-05-15 DIAGNOSIS — R296 Repeated falls: Secondary | ICD-10-CM | POA: Diagnosis present

## 2022-05-15 DIAGNOSIS — R2689 Other abnormalities of gait and mobility: Secondary | ICD-10-CM | POA: Diagnosis present

## 2022-05-15 DIAGNOSIS — R262 Difficulty in walking, not elsewhere classified: Secondary | ICD-10-CM | POA: Diagnosis present

## 2022-05-15 DIAGNOSIS — R2681 Unsteadiness on feet: Secondary | ICD-10-CM | POA: Diagnosis present

## 2022-05-15 DIAGNOSIS — M6281 Muscle weakness (generalized): Secondary | ICD-10-CM | POA: Diagnosis present

## 2022-05-15 DIAGNOSIS — R269 Unspecified abnormalities of gait and mobility: Secondary | ICD-10-CM | POA: Diagnosis present

## 2022-05-15 DIAGNOSIS — R278 Other lack of coordination: Secondary | ICD-10-CM | POA: Insufficient documentation

## 2022-05-15 NOTE — Therapy (Signed)
OUTPATIENT PHYSICAL THERAPY TREATMENT NOTE/Physical Therapy Progress Note   Dates of reporting period  03/15/2022   to   05/15/2022       Patient Name: Alex Hampton MRN: [id number] DOB:[birth year], 67 y.o., male Today's Date: 05/15/2022  PCP: Dr. Juluis Pitch REFERRING PROVIDER: Eulis Canner, MD   PT End of Session - 05/15/22 1340     Visit Number 86    Number of Visits 107    Date for PT Re-Evaluation 07/05/22    Authorization Type Medicare/BCBS; PN 03/23/21    Authorization Time Period Initial Cert= 17/61/6073- 71/09/2692; Recert 8/54/6270-3/50/0938: Recert 1/82/9937- 04/15/9676: Recert 12/11/8099- 7/51/0258; Recert 52/10/7822- 23/53/6144    Progress Note Due on Visit 100    PT Start Time 1340    PT Stop Time 1400    PT Time Calculation (min) 20 min    Equipment Utilized During Treatment Gait belt    Activity Tolerance Patient tolerated treatment well    Behavior During Therapy [facility] for tasks assessed/performed                                 Past Medical History:  Diagnosis Date   Depression    History of cardiac cath    History of heart artery stent    Hyperlipidemia    Hypertension    MI (myocardial infarction) ([place])    Renal disorder    Past Surgical History:  Procedure Laterality Date   IR [place] TUBE CHANGE  11/22/2020   IR [place] TUBE CHANGE  11/26/2020   IR [place] GASTRO/COLONIC TUBE PERCUT W/FLUORO  10/14/2020   LEFT HEART CATH AND CORONARY ANGIOGRAPHY N/A 08/19/2020   Procedure: LEFT HEART CATH AND CORONARY ANGIOGRAPHY;  Surgeon: Corey Skains, MD;  Location: [facility];  Service: Cardiovascular;  Laterality: N/A;   LIVER TRANSPLANT     Patient Active Problem List   Diagnosis Date Noted   Pulmonary embolism ([place]) 08/08/2021   CAP (community acquired pneumonia) 08/08/2021   Protein calorie malnutrition ([place]) 08/08/2021   Hypokalemia 08/08/2021   Pancreatic cyst    Verbal auditory hallucination    Early satiety     Feeding tube dysfunction    Generalized weakness 11/26/2020   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[place]) 11/26/2020   Recurrent falls 11/26/2020   Syncope 10/15/2020   Uremia 10/10/2020   ESRD on hemodialysis ([place]) 10/10/2020   Hyperkalemia 10/10/2020   Elevated troponin 10/10/2020   GERD (gastroesophageal reflux disease) 10/10/2020   CAD (coronary artery disease) 10/10/2020   G tube feedings ([place]) 10/10/2020   Diarrhea 10/10/2020   Diabetes mellitus ([place]) 10/10/2020   Weakness    ACS (acute coronary syndrome) ([place])    Liver transplant recipient [facility])    Anemia of chronic disease    Type 2 diabetes mellitus with diabetic neuropathy, with long-term current use of insulin ([place])    ESRD (end stage renal disease) ([place]) 08/19/2020   History of anemia due to chronic kidney disease 08/19/2020   Gait abnormality 10/17/2019   History of fall within past 90 days 10/17/2019   Visual hallucination 10/17/2019   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 CT 10/17/2019   HTN (hypertension) 10/17/2019   History of MI (myocardial infarction) 10/17/2019   OSA (obstructive sleep apnea) 10/17/2019   Nondisplaced comminuted supracondylar fracture without intercondylar fracture of left humerus, subsequent encounter for fracture with nonunion 10/17/2019   Anxiety and depression 10/17/2019  REFERRING DIAG: Other malaise Z94.4 (ICD-10-CM) - Liver transplant status R26.9 (ICD-10-CM) - Unspecified abnormalities of gait and mobility   THERAPY DIAG:  Difficulty in walking, not elsewhere classified  Muscle weakness (generalized)  Abnormality of gait and mobility  Other abnormalities of gait and mobility  Other lack of coordination  Unsteadiness on feet  Rationale for Evaluation and Treatment Rehabilitation  PERTINENT HISTORY: Patient is a 67 year old male with recent referral for abnomality of gait and recent liver transplant in November 2021. He has past medical history significant for Liver  transplant (02/18/2020), CKD, End stage renal disease- On hemodialyis T/TH/Sat., Anemia, DM with neuropathy, Non-stemi, Coronary artery disease, Feeding tube with poor appetite, PE now on eliquis   PRECAUTIONS: none  SUBJECTIVE: Pt reports feeling very run down today- Daughter reports she had to convince him to come today.   PAIN:  Are you having pain? No  Strength on 04/12/2022 R/L 4/4 Shoulder flexion (anterior deltoid/pec major/coracobrachialis, axillary n. (C5-6) and musculocutaneous n. (C5-7)) 4/4 Shoulder abduction (deltoid/supraspinatus, axillary/suprascapular n, C5) 4/4 Shoulder external rotation (infraspinatus/teres minor) 4/4 Shoulder internal rotation (subcapularis/lats/pec major) 4/4 Shoulder extension (posterior deltoid, lats, teres major, axillary/thoracodorsal n.) 4/4 Shoulder horizontal abduction 4/4 Elbow flexion (biceps brachii, brachialis, brachioradialis, musculoskeletal n, C5-6) 4/4 Elbow extension (triceps, radial n, C7)    TODAY'S TREATMENT: 05/15/2022   Strength on 04/12/2022 R/L 4/3+- pain limited Shoulder flexion (anterior deltoid/pec major/coracobrachialis, axillary n. (C5-6) and musculocutaneous n. (C5-7)) 4/4 Shoulder abduction (deltoid/supraspinatus, axillary/suprascapular n, C5) 4/4 Shoulder external rotation (infraspinatus/teres minor) 4/4 Shoulder internal rotation (subcapularis/lats/pec major) 4/4 Shoulder extension (posterior deltoid, lats, teres major, axillary/thoracodorsal n.) 4/4 Shoulder horizontal abduction 4+/4+ Elbow flexion (biceps brachii, brachialis, brachioradialis, musculoskeletal n, C5-6) 4/4 Elbow extension (triceps, radial n, C7) 42/25 PSI grip strength     THEREX:    Seated hip march 2.5 AW x 12 reps Seated knee ext 2.5 AW x 12 reps *patient reported doing well   148/85 mmHg - Left UE sitting after   Seated heel raises on 1/2 foam - 12 reps Seated toe raises on 1/2 foam- 12 reps HR= 67 bpm; O2 sat=99%    Patient  stopped stating he was too fatigued to continue.   Education provided throughout session via VC/TC and demonstration to facilitate movement at target joints and correct muscle activation for all testing and exercises performed.        PATIENT EDUCATION: Education details: Exercise technique, progress towards remainder goals Person educated: Patient Education method: Explanation, Demonstration, Tactile cues, and Verbal cues Education comprehension: verbalized understanding, returned demonstration, verbal cues required, tactile cues required, and needs further education   HOME EXERCISE PROGRAM: No updates   PT Short Term Goals -      PT SHORT TERM GOAL #1   Title Pt will be independent with HEP in order to improve strength and balance in order to decrease fall risk and improve function at home and work.    Baseline 02/02/2021: Patient reports limited HEP in place currently from [facility] agency 12/14 compliance; 1/20/2023Patient states no questions regarding his current exercise regimen and states he is compliant.    Time 6    Period Weeks    Status Achieved    Target Date 03/16/21              PT Long Term Goals -       PT LONG TERM GOAL #1   Title Pt will improve FOTO to target score of 50 to display perceived improvements in  ability to complete ADL's.    Baseline 02/02/2021= 43 12/143: 51%; 04/29/2021= 53%; 09/09/2021= 53; 10/12/2021=54%   Time 12    Period Weeks    Status Achieved      PT LONG TERM GOAL #2   Title Pt will decrease 5TSTS by at least 8 seconds in order to demonstrate clinically significant improvement in LE strength.    Baseline 02/02/2021= 28.22 sec with BUE Support 12/14: 23.1 seconds; 04/29/2021=Unable to test today due to patient became nauseous with 6 min walk test and attempted TUG- unable to continue- will reassess next 1-2 visits; 06/08/2021= 25.1 sec with min BUE support; 07/20/2021= 17.75 with min BUE support    Time 12    Period Weeks    Status Achieved     Target Date 07/22/21      PT LONG TERM GOAL #3   Title Pt will decrease TUG to below 19 seconds/decrease in order to demonstrate decreased fall risk.    Baseline 02/02/2021= 27.30 sec without AD 12/14: 23 seconds; 04/29/2021- Unable to assess as patient became very nauseated upon standing and request to stop treatment. 06/08/2021= 19.10 sec without an AD; 07/20/2021= 15.68 sec without UE Support    Time 12    Period Weeks    Status Achieved    Target Date 07/22/21      PT LONG TERM GOAL #4   Title Pt will increase 6MWT by at least 78m(1622f in order to demonstrate clinically significant improvement in cardiopulmonary endurance and community ambulation    Baseline 02/02/2021= 83 feet in 1 min 10 sec wihtout an AD 12/14: 200 ft in  44m24mtes; 04/29/2021=Patient ambulated approx 60 feet yet had to stop due onset of nausea and unable to complete today; 07/15/2021= 330 feet in 2 min 25 sec without AD.  09/09/2021= 350 feet in 3 min 45 sec. 10/12/2021= 710 feet in 6 min- no AD (Short stand rest break)    Time 12    Period Weeks    Status Achieved   Target Date 07/22/21      PT LONG TERM GOAL #5   Title Pt will increase 10MWT by at least 0.2 m/s in order to demonstrate clinically significant improvement in community ambulation.    Baseline 02/02/2021= 0.42 m/s 12/14: 0.49 m/s; 04/29/2021=Patient unable to test today secondary to being nauseated. 06/08/2021= 0.62 m/s; 07/20/2021= 0.87 m/s    Time 12    Period Weeks    Status Achieved    Target Date 07/22/21      PT LONG TERM GOAL #6   Title Pt will decrease 5TSTS by at least 3 seconds in order to demonstrate clinically significant improvement in LE strength.    Baseline 07/20/2021= 17.75 sec with Minimal BUE Support; 09/09/2021= 17.50 sec with very minimal UE Support. 10/12/2021= 14.90 sec with min BUE Support. 12/05/2021= 14.30 sec with BUE; 01/09/2022= 23.11 sec with min UE on thighs vs. Arm rests for more emphasis on BLE.  03/15/2022= 17.46 sec without UE  Support. 04/11/2022= 15.22 sec without UE support   Time 12    Period Weeks    Status Ongoing   Target Date 07/05/2022   PT LONG TERM GOAL #7 Title:  Pt will increase 6MWT by at least [id number] order to demonstrate clinically significant improvement in cardiopulmonary endurance and community ambulation  Baseline: 10/12/2021= 710 feet; 12/05/2021= 827 feet; 01/09/2022= 620 feet without an AD - stopping at 4 min mark due to self reported fatigue. 03/15/2022= 1040 feet; 04/13/2022= Patient too fatigued to attempt after other testing. Will assess next visit.  Goal status: ONGOING Target date: 07/05/2022        PT LONG TERM GOAL #8  Title Patient will demonstrate an improved Berg Balance Score of > 50/56 as to demonstrate improved balance with ADLs such as sitting/standing and transfer balance and reduced fall risk.  Baseline 01/09/2022= 47/56; 04/12/2022= 53/56  Time 12   Period Weeks   Status Met  Target Date 04/03/2022   PT LONG TERM GOAL #9  Title Pt will increase BUE shoulder/elbow strength of  by at least 1/2 MMT grade in order to demonstrate improvement in strength and function    Baseline 04/13/2022= BUE = 4/5 throughtout  Time 12   Period Weeks   Status NEW  Target Date 07/05/2022        Plan -      Clinical Impression Statement Patient was supposed to be reassessed as today was visit #90 however patient arrived not feeling well. Did reassess his UE strength and he did present with some improvement mostly with elbow flex/ext. However treatment severely limited as patient reported not feeling well and unable to continue today. Will attempt to reassess all remaining goals next visit. Patient's condition has the potential to improve in response to therapy. Maximum improvement is yet to be obtained. The anticipated improvement is attainable and reasonable in a generally predictable time.  Patient reports  Pt will continue to benefit from  skilled physical therapy intervention to address impairments, improve QOL, and attain therapy goals.     Personal Factors and Comorbidities Comorbidity 3+    Comorbidities DM, Liver transplant, ESRD, CAD, feeding tube    Examination-Activity Limitations Carry;Lift;Reach Overhead;Squat;Stairs;Stand;Transfers;Toileting    Examination-Participation Restrictions Cleaning;Community Activity;Driving;Medication Management;Meal Prep;Yard Work    [employer] Evolving/Moderate complexity    Rehab Potential Fair    PT Frequency 2x / week    PT Duration 12 weeks    PT Treatment/Interventions ADLs/Self Care Home Management;Cryotherapy;Moist Heat;DME Instruction;Gait training;Stair training;Functional mobility training;Therapeutic activities;Therapeutic exercise;Balance training;Neuromuscular re-education;Patient/family education;Wheelchair mobility training;Manual techniques;Passive range of motion;Dry needling;Energy conservation;Joint Manipulations    PT Next Visit Plan Progressive Therapeutic exercises, Balance training, transfer/gait training. Plan for reassessing goals next visit.    PT Home Exercise Plan No changes or updates today.    Consulted and Agree with Plan of Care Patient             Ollen Bowl, PT Physical Therapist- [facility]  05/15/2022, 2:16 PM

## 2022-05-17 ENCOUNTER — Ambulatory Visit: Payer: Medicare Other

## 2022-05-17 DIAGNOSIS — M6281 Muscle weakness (generalized): Secondary | ICD-10-CM

## 2022-05-17 DIAGNOSIS — R269 Unspecified abnormalities of gait and mobility: Secondary | ICD-10-CM

## 2022-05-17 DIAGNOSIS — R2681 Unsteadiness on feet: Secondary | ICD-10-CM

## 2022-05-17 DIAGNOSIS — R2689 Other abnormalities of gait and mobility: Secondary | ICD-10-CM

## 2022-05-17 DIAGNOSIS — R262 Difficulty in walking, not elsewhere classified: Secondary | ICD-10-CM

## 2022-05-17 DIAGNOSIS — R296 Repeated falls: Secondary | ICD-10-CM

## 2022-05-17 DIAGNOSIS — R278 Other lack of coordination: Secondary | ICD-10-CM

## 2022-05-17 NOTE — Therapy (Addendum)
OUTPATIENT PHYSICAL THERAPY TREATMENT NOTE   Patient Name: Alex Hampton MRN: [id number] DOB:1955/09/01, 67 y.o., male Today's Date: 05/17/2022  PCP: Dr. Juluis Pitch REFERRING PROVIDER: Eulis Canner, MD   PT End of Session - 05/17/22 1353     Visit Number 91    Number of Visits 107    Date for PT Re-Evaluation 07/05/22    Authorization Type Medicare/BCBS; PN 03/23/21    Authorization Time Period 04/12/22-07/05/22    Progress Note Due on Visit 100    PT Start Time 7106    PT Stop Time 1428    PT Time Calculation (min) 40 min    Activity Tolerance Patient tolerated treatment well;No increased pain;Patient limited by fatigue    Behavior During Therapy [facility] for tasks assessed/performed               Past Medical History:  Diagnosis Date   Depression    History of cardiac cath    History of heart artery stent    Hyperlipidemia    Hypertension    MI (myocardial infarction) ([place])    Renal disorder    Past Surgical History:  Procedure Laterality Date   IR GJ TUBE CHANGE  11/22/2020   IR [place] TUBE CHANGE  11/26/2020   IR [place] GASTRO/COLONIC TUBE PERCUT W/FLUORO  10/14/2020   LEFT HEART CATH AND CORONARY ANGIOGRAPHY N/A 08/19/2020   Procedure: LEFT HEART CATH AND CORONARY ANGIOGRAPHY;  Surgeon: Corey Skains, MD;  Location: [place] [facility];  Service: Cardiovascular;  Laterality: N/A;   LIVER TRANSPLANT     Patient Active Problem List   Diagnosis Date Noted   Pulmonary embolism ([place]) 08/08/2021   CAP (community acquired pneumonia) 08/08/2021   Protein calorie malnutrition ([place]) 08/08/2021   Hypokalemia 08/08/2021   Pancreatic cyst    Verbal auditory hallucination    Early satiety    Feeding tube dysfunction    Generalized weakness 11/26/2020   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[place]) 11/26/2020   Recurrent falls 11/26/2020   Syncope 10/15/2020   Uremia 10/10/2020   ESRD on hemodialysis ([place]) 10/10/2020   Hyperkalemia 10/10/2020   Elevated  troponin 10/10/2020   GERD (gastroesophageal reflux disease) 10/10/2020   CAD (coronary artery disease) 10/10/2020   G tube feedings ([place]) 10/10/2020   Diarrhea 10/10/2020   Diabetes mellitus ([place]) 10/10/2020   Weakness    ACS (acute coronary syndrome) ([place])    Liver transplant recipient [facility])    Anemia of chronic disease    Type 2 diabetes mellitus with diabetic neuropathy, with long-term current use of insulin ([place])    ESRD (end stage renal disease) ([place]) 08/19/2020   History of anemia due to chronic kidney disease 08/19/2020   Gait abnormality 10/17/2019   History of fall within past 90 days 10/17/2019   Visual hallucination 10/17/2019   Cirrhosis of liver with ascites ([place]) on CT 10/17/2019   HTN (hypertension) 10/17/2019   History of MI (myocardial infarction) 10/17/2019   OSA (obstructive sleep apnea) 10/17/2019   Nondisplaced comminuted supracondylar fracture without intercondylar fracture of left humerus, subsequent encounter for fracture with nonunion 10/17/2019   Anxiety and depression 10/17/2019    REFERRING DIAG: Other malaise Z94.4 (ICD-10-CM) - Liver transplant status R26.9 (ICD-10-CM) - Unspecified abnormalities of gait and mobility   THERAPY DIAG:  Difficulty in walking, not elsewhere classified  Muscle weakness (generalized)  Abnormality of gait and mobility  Other abnormalities of gait and mobility  Other lack of coordination  Unsteadiness on feet  Repeated falls  Rationale for Evaluation and Treatment Rehabilitation  PERTINENT HISTORY: Patient is a 67 year old male with recent referral for abnomality of gait and recent liver transplant in November 2021. He has past medical history significant for Liver transplant (02/18/2020), CKD, End stage renal disease- On hemodialyis T/TH/Sat., Anemia, DM with neuropathy, Non-stemi, Coronary artery disease, Feeding tube with poor appetite, PE now on eliquis   PRECAUTIONS: none  SUBJECTIVE: Still feeling not good.  Feed machine broke last night, so he has not had sufficient nutrition. He should have a replacement machine by tomorrow.   PAIN:  Are you having pain? No   INTERVENTION 05/17/22  Seated shoulder ABDCT 1x12 @ 2lb  Neck scarf GTB triceps rope 1x12  Seated shoulder ABDCT 1x12 @ 3lb  Neck scarf GTB triceps rope 1x12  Seated shoulder ABDCT 1x12 @ 3lb  Neck scarf GTB triceps rope 1x12   10lb ball, BUE biceps curl to sternum, forward lift to chin level table and reverse x10  Blue TB row  10lb ball, BUE biceps curl to sternum, forward lift to chin level table and reverse x10  Blue TB row   *pt asks to end session at this point due to being limited in reserves, session ended at 30 minutes         PATIENT EDUCATION: Education details: Exercise technique, progress towards remainder goals Person educated: Patient Education method: Explanation, Demonstration, Tactile cues, and Verbal cues Education comprehension: verbalized understanding, returned demonstration, verbal cues required, tactile cues required, and needs further education   HOME EXERCISE PROGRAM: No updates   PT Short Term Goals -      PT SHORT TERM GOAL #1   Title Pt will be independent with HEP in order to improve strength and balance in order to decrease fall risk and improve function at home and work.    Baseline 02/02/2021: Patient reports limited HEP in place currently from [facility] agency 12/14 compliance; 1/20/2023Patient states no questions regarding his current exercise regimen and states he is compliant.    Time 6    Period Weeks    Status Achieved    Target Date 03/16/21              PT Long Term Goals -       PT LONG TERM GOAL #1   Title Pt will improve FOTO to target score of 50 to display perceived improvements in ability to complete ADL's.    Baseline 02/02/2021= 43 12/143: 51%; 04/29/2021= 53%; 09/09/2021= 53; 10/12/2021=54%   Time 12    Period Weeks    Status Achieved      PT LONG TERM GOAL #2    Title Pt will decrease 5TSTS by at least 8 seconds in order to demonstrate clinically significant improvement in LE strength.    Baseline 02/02/2021= 28.22 sec with BUE Support 12/14: 23.1 seconds; 04/29/2021=Unable to test today due to patient became nauseous with 6 min walk test and attempted TUG- unable to continue- will reassess next 1-2 visits; 06/08/2021= 25.1 sec with min BUE support; 07/20/2021= 17.75 with min BUE support    Time 12    Period Weeks    Status Achieved    Target Date 07/22/21      PT LONG TERM GOAL #3   Title Pt will decrease TUG to below 19 seconds/decrease in order to demonstrate decreased fall risk.    Baseline 02/02/2021= 27.30 sec without AD 12/14: 23 seconds; 04/29/2021- Unable to assess as patient became very nauseated  upon standing and request to stop treatment. 06/08/2021= 19.10 sec without an AD; 07/20/2021= 15.68 sec without UE Support    Time 12    Period Weeks    Status Achieved    Target Date 07/22/21      PT LONG TERM GOAL #4   Title Pt will increase 6MWT by at least 57m(1673f in order to demonstrate clinically significant improvement in cardiopulmonary endurance and community ambulation    Baseline 02/02/2021= 83 feet in 1 min 10 sec wihtout an AD 12/14: 200 ft in  68m768mtes; 04/29/2021=Patient ambulated approx 60 feet yet had to stop due onset of nausea and unable to complete today; 07/15/2021= 330 feet in 2 min 25 sec without AD.  09/09/2021= 350 feet in 3 min 45 sec. 10/12/2021= 710 feet in 6 min- no AD (Short stand rest break)    Time 12    Period Weeks    Status Achieved   Target Date 07/22/21      PT LONG TERM GOAL #5   Title Pt will increase 10MWT by at least 0.2 m/s in order to demonstrate clinically significant improvement in community ambulation.    Baseline 02/02/2021= 0.42 m/s 12/14: 0.49 m/s; 04/29/2021=Patient unable to test today secondary to being nauseated. 06/08/2021= 0.62 m/s; 07/20/2021= 0.87 m/s    Time 12    Period Weeks    Status Achieved     Target Date 07/22/21      PT LONG TERM GOAL #6   Title Pt will decrease 5TSTS by at least 3 seconds in order to demonstrate clinically significant improvement in LE strength.    Baseline 07/20/2021= 17.75 sec with Minimal BUE Support; 09/09/2021= 17.50 sec with very minimal UE Support. 10/12/2021= 14.90 sec with min BUE Support. 12/05/2021= 14.30 sec with BUE; 01/09/2022= 23.11 sec with min UE on thighs vs. Arm rests for more emphasis on BLE.  03/15/2022= 17.46 sec without UE Support. 04/11/2022= 15.22 sec without UE support   Time 12    Period Weeks    Status Ongoing   Target Date 07/05/2022   PT LONG TERM GOAL #7 Title:  Pt will increase 6MWT by at least [id number] order to demonstrate clinically significant improvement in cardiopulmonary endurance and community ambulation                                                       Baseline: 10/12/2021= 710 feet; 12/05/2021= 827 feet; 01/09/2022= 620 feet without an AD - stopping at 4 min mark due to self reported fatigue. 03/15/2022= 1040 feet; 04/13/2022= Patient too fatigued to attempt after other testing. Will assess next visit.  Goal status: ONGOING Target date: 07/05/2022        PT LONG TERM GOAL #8  Title Patient will demonstrate an improved Berg Balance Score of > 50/56 as to demonstrate improved balance with ADLs such as sitting/standing and transfer balance and reduced fall risk.  Baseline 01/09/2022= 47/56; 04/12/2022= 53/56  Time 12   Period Weeks   Status Met  Target Date 04/03/2022   PT LONG TERM GOAL #9  Title Pt will increase BUE shoulder/elbow strength of  by at least 1/2 MMT grade in order to demonstrate improvement in strength and function    Baseline 04/13/2022= BUE = 4/5 throughtout  Time 12   Period Weeks  Status NEW  Target Date 07/05/2022        Plan -      Clinical Impression Statement Pt arrives despite still feeling poorly, remains motivated to progress his shoulder strength, he asks to focus on low impact activities today.  Generally good tolerance with typical BUE moderate loading. Follow cues for form correction with high accuracy. Will continue to follow until goals have been met, will modify session length/intensity as needed due to fluctuating comorbidities.    Personal Factors and Comorbidities Comorbidity 3+    Comorbidities DM, Liver transplant, ESRD, CAD, feeding tube    Examination-Activity Limitations Carry;Lift;Reach Overhead;Squat;Stairs;Stand;Transfers;Toileting    Examination-Participation Restrictions Cleaning;Community Activity;Driving;Medication Management;Meal Prep;Yard Work    [employer] Evolving/Moderate complexity    Rehab Potential Fair    PT Frequency 2x / week    PT Duration 12 weeks    PT Treatment/Interventions ADLs/Self Care Home Management;Cryotherapy;Moist Heat;DME Instruction;Gait training;Stair training;Functional mobility training;Therapeutic activities;Therapeutic exercise;Balance training;Neuromuscular re-education;Patient/family education;Wheelchair mobility training;Manual techniques;Passive range of motion;Dry needling;Energy conservation;Joint Manipulations    PT Next Visit Plan Progressive Therapeutic exercises, Balance training, transfer/gait training. Plan for reassessing goals next visit.    PT Home Exercise Plan No changes or updates today.    Consulted and Agree with Plan of Care Patient            2:10 PM, 05/17/22 Etta Grandchild, PT, DPT Physical Therapist - [facility]  [phone] ([place])     05/17/2022, 2:09 PM

## 2022-05-22 ENCOUNTER — Ambulatory Visit: Payer: Medicare Other

## 2022-05-22 DIAGNOSIS — M6281 Muscle weakness (generalized): Secondary | ICD-10-CM

## 2022-05-22 DIAGNOSIS — R269 Unspecified abnormalities of gait and mobility: Secondary | ICD-10-CM

## 2022-05-22 DIAGNOSIS — R262 Difficulty in walking, not elsewhere classified: Secondary | ICD-10-CM | POA: Diagnosis not present

## 2022-05-22 DIAGNOSIS — R278 Other lack of coordination: Secondary | ICD-10-CM

## 2022-05-22 DIAGNOSIS — R296 Repeated falls: Secondary | ICD-10-CM

## 2022-05-22 DIAGNOSIS — R2681 Unsteadiness on feet: Secondary | ICD-10-CM

## 2022-05-22 DIAGNOSIS — R2689 Other abnormalities of gait and mobility: Secondary | ICD-10-CM

## 2022-05-22 NOTE — Therapy (Signed)
OUTPATIENT PHYSICAL THERAPY TREATMENT NOTE   Patient Name: Alex Hampton MRN: [id number] DOB:[birth year], 67 y.o., male Today's Date: 05/22/2022  PCP: Dr. Juluis Pitch REFERRING PROVIDER: Eulis Canner, MD   PT End of Session - 05/22/22 1350     Visit Number 92    Number of Visits 107    Date for PT Re-Evaluation 07/05/22    Authorization Type Medicare/BCBS; PN 03/23/21    Authorization Time Period 04/12/22-07/05/22    Progress Note Due on Visit 100    PT Start Time [id number]    PT Stop Time 1425    PT Time Calculation (min) 37 min    Activity Tolerance Patient tolerated treatment well;No increased pain;Patient limited by fatigue    Behavior During Therapy [facility] for tasks assessed/performed                Past Medical History:  Diagnosis Date   Depression    History of cardiac cath    History of heart artery stent    Hyperlipidemia    Hypertension    MI (myocardial infarction) ([place])    Renal disorder    Past Surgical History:  Procedure Laterality Date   IR [place] TUBE CHANGE  11/22/2020   IR [place] TUBE CHANGE  11/26/2020   IR [place] GASTRO/COLONIC TUBE PERCUT W/FLUORO  10/14/2020   LEFT HEART CATH AND CORONARY ANGIOGRAPHY N/A 08/19/2020   Procedure: LEFT HEART CATH AND CORONARY ANGIOGRAPHY;  Surgeon: Corey Skains, MD;  Location: [place] [facility];  Service: Cardiovascular;  Laterality: N/A;   LIVER TRANSPLANT     Patient Active Problem List   Diagnosis Date Noted   Pulmonary embolism ([place]) 08/08/2021   CAP (community acquired pneumonia) 08/08/2021   Protein calorie malnutrition ([place]) 08/08/2021   Hypokalemia 08/08/2021   Pancreatic cyst    Verbal auditory hallucination    Early satiety    Feeding tube dysfunction    Generalized weakness 11/26/2020   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[place]) 11/26/2020   Recurrent falls 11/26/2020   Syncope 10/15/2020   Uremia 10/10/2020   ESRD on hemodialysis ([place]) 10/10/2020   Hyperkalemia 10/10/2020   Elevated  troponin 10/10/2020   GERD (gastroesophageal reflux disease) 10/10/2020   CAD (coronary artery disease) 10/10/2020   G tube feedings ([place]) 10/10/2020   Diarrhea 10/10/2020   Diabetes mellitus ([place]) 10/10/2020   Weakness    ACS (acute coronary syndrome) ([place])    Liver transplant recipient [facility])    Anemia of chronic disease    Type 2 diabetes mellitus with diabetic neuropathy, with long-term current use of insulin ([place])    ESRD (end stage renal disease) ([place]) 08/19/2020   History of anemia due to chronic kidney disease 08/19/2020   Gait abnormality 10/17/2019   History of fall within past 90 days 10/17/2019   Visual hallucination 10/17/2019   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 10/17/2019   HTN (hypertension) 10/17/2019   History of MI (myocardial infarction) 10/17/2019   OSA (obstructive sleep apnea) 10/17/2019   Nondisplaced comminuted supracondylar fracture without intercondylar fracture of left humerus, subsequent encounter for fracture with nonunion 10/17/2019   Anxiety and depression 10/17/2019    REFERRING DIAG: Other malaise Z94.4 (ICD-10-CM) - Liver transplant status R26.9 (ICD-10-CM) - Unspecified abnormalities of gait and mobility   THERAPY DIAG:  Difficulty in walking, not elsewhere classified  Muscle weakness (generalized)  Abnormality of gait and mobility  Other abnormalities of gait and mobility  Other lack of coordination  Unsteadiness on feet  Repeated falls  Rationale for Evaluation and Treatment Rehabilitation  PERTINENT HISTORY: Patient is a 67 year old male with recent referral for abnomality of gait and recent liver transplant in November 2021. He has past medical history significant for Liver transplant (02/18/2020), CKD, End stage renal disease- On hemodialyis T/TH/Sat., Anemia, DM with neuropathy, Non-stemi, Coronary artery disease, Feeding tube with poor appetite, PE now on eliquis   PRECAUTIONS: none  SUBJECTIVE: Patient reports feeling  okay today- states sleeping better.  PAIN:  Are you having pain? No   INTERVENTION 05/22/22  Seated shoulder ABD 1x12 @ 2lb (partial ROM) Seated shoulder flex/ext 1x12 @ 2lb  Seated shoulder ABDCT 1x12 @ 2lb  Seated bicep flex @ 2lb x 12 reps   Standing step tap @4lb$  x 12 reps Standing Step up @4lb$  x12 reps  Short ambulation 130 feet with 4lb AW; O2 sat=95%; HR=72 bpm   AAROM Shoulder abd with use of cane- 2 sets of 12reps  Standing push up (with BUE support on support bar) x 12 reps  Seated LAQ @4lb$  AW x 12 reps (VC to slow down to maximize muscle control)   Short ambulation 130 feet with 4lb AW; O2 sat=95; HR=102 bpm  Standing Tricep press down 12.5 lb 2 sets of 12 reps.  Standing bicep curl @5lb$  dumbbell 2 sets of 12 reps  Pt educated throughout session about proper posture and technique with exercises. Improved exercise technique, movement at target joints, use of target muscles after min to mod verbal, visual, tactile cues.     PATIENT EDUCATION: Education details: Exercise technique, progress towards remainder goals Person educated: Patient Education method: Explanation, Demonstration, Tactile cues, and Verbal cues Education comprehension: verbalized understanding, returned demonstration, verbal cues required, tactile cues required, and needs further education   HOME EXERCISE PROGRAM: No updates   PT Short Term Goals -      PT SHORT TERM GOAL #1   Title Pt will be independent with HEP in order to improve strength and balance in order to decrease fall risk and improve function at home and work.    Baseline 02/02/2021: Patient reports limited HEP in place currently from [facility] agency 12/14 compliance; 1/20/2023Patient states no questions regarding his current exercise regimen and states he is compliant.    Time 6    Period Weeks    Status Achieved    Target Date 03/16/21              PT Long Term Goals -       PT LONG TERM GOAL #1   Title Pt will  improve FOTO to target score of 50 to display perceived improvements in ability to complete ADL's.    Baseline 02/02/2021= 43 12/143: 51%; 04/29/2021= 53%; 09/09/2021= 53; 10/12/2021=54%   Time 12    Period Weeks    Status Achieved      PT LONG TERM GOAL #2   Title Pt will decrease 5TSTS by at least 8 seconds in order to demonstrate clinically significant improvement in LE strength.    Baseline 02/02/2021= 28.22 sec with BUE Support 12/14: 23.1 seconds; 04/29/2021=Unable to test today due to patient became nauseous with 6 min walk test and attempted TUG- unable to continue- will reassess next 1-2 visits; 06/08/2021= 25.1 sec with min BUE support; 07/20/2021= 17.75 with min BUE support    Time 12    Period Weeks    Status Achieved    Target Date 07/22/21      PT LONG TERM GOAL #  3   Title Pt will decrease TUG to below 19 seconds/decrease in order to demonstrate decreased fall risk.    Baseline 02/02/2021= 27.30 sec without AD 12/14: 23 seconds; 04/29/2021- Unable to assess as patient became very nauseated upon standing and request to stop treatment. 06/08/2021= 19.10 sec without an AD; 07/20/2021= 15.68 sec without UE Support    Time 12    Period Weeks    Status Achieved    Target Date 07/22/21      PT LONG TERM GOAL #4   Title Pt will increase 6MWT by at least 19m(1632f in order to demonstrate clinically significant improvement in cardiopulmonary endurance and community ambulation    Baseline 02/02/2021= 83 feet in 1 min 10 sec wihtout an AD 12/14: 200 ft in  65m68mtes; 04/29/2021=Patient ambulated approx 60 feet yet had to stop due onset of nausea and unable to complete today; 07/15/2021= 330 feet in 2 min 25 sec without AD.  09/09/2021= 350 feet in 3 min 45 sec. 10/12/2021= 710 feet in 6 min- no AD (Short stand rest break)    Time 12    Period Weeks    Status Achieved   Target Date 07/22/21      PT LONG TERM GOAL #5   Title Pt will increase 10MWT by at least 0.2 m/s in order to demonstrate clinically  significant improvement in community ambulation.    Baseline 02/02/2021= 0.42 m/s 12/14: 0.49 m/s; 04/29/2021=Patient unable to test today secondary to being nauseated. 06/08/2021= 0.62 m/s; 07/20/2021= 0.87 m/s    Time 12    Period Weeks    Status Achieved    Target Date 07/22/21      PT LONG TERM GOAL #6   Title Pt will decrease 5TSTS by at least 3 seconds in order to demonstrate clinically significant improvement in LE strength.    Baseline 07/20/2021= 17.75 sec with Minimal BUE Support; 09/09/2021= 17.50 sec with very minimal UE Support. 10/12/2021= 14.90 sec with min BUE Support. 12/05/2021= 14.30 sec with BUE; 01/09/2022= 23.11 sec with min UE on thighs vs. Arm rests for more emphasis on BLE.  03/15/2022= 17.46 sec without UE Support. 04/11/2022= 15.22 sec without UE support   Time 12    Period Weeks    Status Ongoing   Target Date 07/05/2022   PT LONG TERM GOAL #7 Title:  Pt will increase 6MWT by at least 33m665m4ft12f order to demonstrate clinically significant improvement in cardiopulmonary endurance and community ambulation                                                       Baseline: 10/12/2021= 710 feet; 12/05/2021= 827 feet; 01/09/2022= 620 feet without an AD - stopping at 4 min mark due to self reported fatigue. 03/15/2022= 1040 feet; 04/13/2022= Patient too fatigued to attempt after other testing. Will assess next visit.  Goal status: ONGOING Target date: 07/05/2022        PT LONG TERM GOAL #8  Title Patient will demonstrate an improved Berg Balance Score of > 50/56 as to demonstrate improved balance with ADLs such as sitting/standing and transfer balance and reduced fall risk.  Baseline 01/09/2022= 47/56; 04/12/2022= 53/56  Time 12   Period Weeks   Status Met  Target Date 04/03/2022   PT LONG TERM GOAL #9  Title Pt  will increase BUE shoulder/elbow strength of  by at least 1/2 MMT grade in order to demonstrate improvement in strength and function    Baseline 04/13/2022= BUE = 4/5 throughtout   Time 12   Period Weeks   Status NEW  Target Date 07/05/2022        Plan -      Clinical Impression Statement Patient performed well overall- able to transition smoothly from UE to LE to endurance activities to improve his stamina with standing and activities. He was able to participate well with minimal rest breaks today as well. Will continue to follow until goals have been met, will modify session length/intensity as needed due to fluctuating comorbidities.    Personal Factors and Comorbidities Comorbidity 3+    Comorbidities DM, Liver transplant, ESRD, CAD, feeding tube    Examination-Activity Limitations Carry;Lift;Reach Overhead;Squat;Stairs;Stand;Transfers;Toileting    Examination-Participation Restrictions Cleaning;Community Activity;Driving;Medication Management;Meal Prep;Yard Work    [employer] officer Evolving/Moderate complexity    Rehab Potential Fair    PT Frequency 2x / week    PT Duration 12 weeks    PT Treatment/Interventions ADLs/Self Care Home Management;Cryotherapy;Moist Heat;DME Instruction;Gait training;Stair training;Functional mobility training;Therapeutic activities;Therapeutic exercise;Balance training;Neuromuscular re-education;Patient/family education;Wheelchair mobility training;Manual techniques;Passive range of motion;Dry needling;Energy conservation;Joint Manipulations    PT Next Visit Plan Progressive Therapeutic exercises, Balance training, transfer/gait training.    PT Home Exercise Plan No changes or updates today.    Consulted and Agree with Plan of Care Patient             Ollen Bowl, PT  Physical Therapist - [facility]      05/22/2022, 2:42 PM

## 2022-05-24 ENCOUNTER — Ambulatory Visit: Payer: Medicare Other

## 2022-05-24 DIAGNOSIS — M6281 Muscle weakness (generalized): Secondary | ICD-10-CM

## 2022-05-24 DIAGNOSIS — R262 Difficulty in walking, not elsewhere classified: Secondary | ICD-10-CM | POA: Diagnosis not present

## 2022-05-24 DIAGNOSIS — R2689 Other abnormalities of gait and mobility: Secondary | ICD-10-CM

## 2022-05-24 DIAGNOSIS — R269 Unspecified abnormalities of gait and mobility: Secondary | ICD-10-CM

## 2022-05-24 NOTE — Therapy (Signed)
OUTPATIENT PHYSICAL THERAPY TREATMENT NOTE   Patient Name: Alex Hampton MRN: [id number] DOB:1955/09/30, 67 y.o., male Today's Date: 05/24/2022  PCP: Dr. Juluis Pitch REFERRING PROVIDER: Eulis Canner, MD   PT End of Session - 05/24/22 1348     Visit Number 93    Number of Visits 107    Date for PT Re-Evaluation 07/05/22    Authorization Type Medicare/BCBS; PN 03/23/21    Authorization Time Period 04/12/22-07/05/22    Progress Note Due on Visit 100    PT Start Time 1345    PT Stop Time 1423    PT Time Calculation (min) 38 min    Activity Tolerance Patient tolerated treatment well;No increased pain;Patient limited by fatigue    Behavior During Therapy [facility] for tasks assessed/performed                Past Medical History:  Diagnosis Date   Depression    History of cardiac cath    History of heart artery stent    Hyperlipidemia    Hypertension    MI (myocardial infarction) ([place])    Renal disorder    Past Surgical History:  Procedure Laterality Date   IR GJ TUBE CHANGE  11/22/2020   IR [place] TUBE CHANGE  11/26/2020   IR [place] GASTRO/COLONIC TUBE PERCUT W/FLUORO  10/14/2020   LEFT HEART CATH AND CORONARY ANGIOGRAPHY N/A 08/19/2020   Procedure: LEFT HEART CATH AND CORONARY ANGIOGRAPHY;  Surgeon: Corey Skains, MD;  Location: [place] [facility];  Service: Cardiovascular;  Laterality: N/A;   LIVER TRANSPLANT     Patient Active Problem List   Diagnosis Date Noted   Pulmonary embolism ([place]) 08/08/2021   CAP (community acquired pneumonia) 08/08/2021   Protein calorie malnutrition ([place]) 08/08/2021   Hypokalemia 08/08/2021   Pancreatic cyst    Verbal auditory hallucination    Early satiety    Feeding tube dysfunction    Generalized weakness 11/26/2020   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[place]) 11/26/2020   Recurrent falls 11/26/2020   Syncope 10/15/2020   Uremia 10/10/2020   ESRD on hemodialysis ([place]) 10/10/2020   Hyperkalemia 10/10/2020   Elevated  troponin 10/10/2020   GERD (gastroesophageal reflux disease) 10/10/2020   CAD (coronary artery disease) 10/10/2020   G tube feedings ([place]) 10/10/2020   Diarrhea 10/10/2020   Diabetes mellitus ([place]) 10/10/2020   Weakness    ACS (acute coronary syndrome) ([place])    Liver transplant recipient [facility])    Anemia of chronic disease    Type 2 diabetes mellitus with diabetic neuropathy, with long-term current use of insulin ([place])    ESRD (end stage renal disease) ([place]) 08/19/2020   History of anemia due to chronic kidney disease 08/19/2020   Gait abnormality 10/17/2019   History of fall within past 90 days 10/17/2019   Visual hallucination 10/17/2019   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 CT 10/17/2019   HTN (hypertension) 10/17/2019   History of MI (myocardial infarction) 10/17/2019   OSA (obstructive sleep apnea) 10/17/2019   Nondisplaced comminuted supracondylar fracture without intercondylar fracture of left humerus, subsequent encounter for fracture with nonunion 10/17/2019   Anxiety and depression 10/17/2019    REFERRING DIAG: Other malaise Z94.4 (ICD-10-CM) - Liver transplant status R26.9 (ICD-10-CM) - Unspecified abnormalities of gait and mobility   THERAPY DIAG:  Difficulty in walking, not elsewhere classified  Muscle weakness (generalized)  Abnormality of gait and mobility  Other abnormalities of gait and mobility  Rationale for Evaluation and Treatment Rehabilitation  PERTINENT  HISTORY: Patient is a 67 year old male with recent referral for abnomality of gait and recent liver transplant in November 2021. He has past medical history significant for Liver transplant (02/18/2020), CKD, End stage renal disease- On hemodialyis T/TH/Sat., Anemia, DM with neuropathy, Non-stemi, Coronary artery disease, Feeding tube with poor appetite, PE now on eliquis   PRECAUTIONS: none  SUBJECTIVE: Patient reports feeling okay today- states sleeping better.  PAIN:  Are you having pain?  No   INTERVENTION 05/24/22  INTERVAL NuStep for cardiovasular/mm endurance Level 0 for 1 min Level 3 for 30 sec Level 0 for 1 min Level 4 for 30 sec Level 0 for 1 min Level 5 for 20 sec Level 0 for 40 sec Total distance = 0.18 mi RPE = 5/10  High knee march walk in // bars - down and back x 2 (total 40 feet)  Amb in //  bars with 3lb AW - VC to take large forward steps- down and back x 2 (approx 4 steps from one end to other)  Amb in // bars with 3lb AW - VC to take large forward steps - down and back x 2 (approx 4-6 steps from one end to other)  Standing tricep ext @ 12.5 lb using matrix cable system x 2 sets  Seated bicep flex @ matrix cable system RUE x 12 reps at 7.5 lb and left UE at 2.5 lb x 12 reps   Short ambulation 10 M  1) normal pace and cadence= 9.99 sec 20 steps 2) VC for increased step length= 11.03 sec 16 steps 3) VC for increased step length= 9.82 16 steps  Sit to stand hands free x 10 reps (challenging to hard= 6/10 on RPE scale)    Standing  wall push up  x 10 reps (Patient reports as challenging)   Seated LAQ @4lb$  AW x 12 reps (VC to slow down to maximize muscle control)    Standing Tricep press down 12.5 lb 2 sets of 12 reps.  Standing bicep curl @5lb$  dumbbell 2 sets of 12 reps  Pt educated throughout session about proper posture and technique with exercises. Improved exercise technique, movement at target joints, use of target muscles after min to mod verbal, visual, tactile cues.     PATIENT EDUCATION: Education details: Exercise technique, progress towards remainder goals Person educated: Patient Education method: Explanation, Demonstration, Tactile cues, and Verbal cues Education comprehension: verbalized understanding, returned demonstration, verbal cues required, tactile cues required, and needs further education   HOME EXERCISE PROGRAM: No updates   PT Short Term Goals -      PT SHORT TERM GOAL #1   Title Pt will be independent with  HEP in order to improve strength and balance in order to decrease fall risk and improve function at home and work.    Baseline 02/02/2021: Patient reports limited HEP in place currently from [facility] agency 12/14 compliance; 1/20/2023Patient states no questions regarding his current exercise regimen and states he is compliant.    Time 6    Period Weeks    Status Achieved    Target Date 03/16/21              PT Long Term Goals -       PT LONG TERM GOAL #1   Title Pt will improve FOTO to target score of 50 to display perceived improvements in ability to complete ADL's.    Baseline 02/02/2021= 43 12/143: 51%; 04/29/2021= 53%; 09/09/2021= 53; 10/12/2021=54%   Time  12    Period Weeks    Status Achieved      PT LONG TERM GOAL #2   Title Pt will decrease 5TSTS by at least 8 seconds in order to demonstrate clinically significant improvement in LE strength.    Baseline 02/02/2021= 28.22 sec with BUE Support 12/14: 23.1 seconds; 04/29/2021=Unable to test today due to patient became nauseous with 6 min walk test and attempted TUG- unable to continue- will reassess next 1-2 visits; 06/08/2021= 25.1 sec with min BUE support; 07/20/2021= 17.75 with min BUE support    Time 12    Period Weeks    Status Achieved    Target Date 07/22/21      PT LONG TERM GOAL #3   Title Pt will decrease TUG to below 19 seconds/decrease in order to demonstrate decreased fall risk.    Baseline 02/02/2021= 27.30 sec without AD 12/14: 23 seconds; 04/29/2021- Unable to assess as patient became very nauseated upon standing and request to stop treatment. 06/08/2021= 19.10 sec without an AD; 07/20/2021= 15.68 sec without UE Support    Time 12    Period Weeks    Status Achieved    Target Date 07/22/21      PT LONG TERM GOAL #4   Title Pt will increase 6MWT by at least 531m(1685f in order to demonstrate clinically significant improvement in cardiopulmonary endurance and community ambulation    Baseline 02/02/2021= 83 feet in 1 min 10 sec  wihtout an AD 12/14: 200 ft in  31m431mtes; 04/29/2021=Patient ambulated approx 60 feet yet had to stop due onset of nausea and unable to complete today; 07/15/2021= 330 feet in 2 min 25 sec without AD.  09/09/2021= 350 feet in 3 min 45 sec. 10/12/2021= 710 feet in 6 min- no AD (Short stand rest break)    Time 12    Period Weeks    Status Achieved   Target Date 07/22/21      PT LONG TERM GOAL #5   Title Pt will increase 10MWT by at least 0.2 m/s in order to demonstrate clinically significant improvement in community ambulation.    Baseline 02/02/2021= 0.42 m/s 12/14: 0.49 m/s; 04/29/2021=Patient unable to test today secondary to being nauseated. 06/08/2021= 0.62 m/s; 07/20/2021= 0.87 m/s    Time 12    Period Weeks    Status Achieved    Target Date 07/22/21      PT LONG TERM GOAL #6   Title Pt will decrease 5TSTS by at least 3 seconds in order to demonstrate clinically significant improvement in LE strength.    Baseline 07/20/2021= 17.75 sec with Minimal BUE Support; 09/09/2021= 17.50 sec with very minimal UE Support. 10/12/2021= 14.90 sec with min BUE Support. 12/05/2021= 14.30 sec with BUE; 01/09/2022= 23.11 sec with min UE on thighs vs. Arm rests for more emphasis on BLE.  03/15/2022= 17.46 sec without UE Support. 04/11/2022= 15.22 sec without UE support   Time 12    Period Weeks    Status Ongoing   Target Date 07/05/2022   PT LONG TERM GOAL #7 Title:  Pt will increase 6MWT by at least 63m6m4ft55f order to demonstrate clinically significant improvement in cardiopulmonary endurance and community ambulation  Baseline: 10/12/2021= 710 feet; 12/05/2021= 827 feet; 01/09/2022= 620 feet without an AD - stopping at 4 min mark due to self reported fatigue. 03/15/2022= 1040 feet; 04/13/2022= Patient too fatigued to attempt after other testing. Will assess next visit.  Goal status: ONGOING Target date: 07/05/2022        PT LONG TERM GOAL #8  Title Patient will  demonstrate an improved Berg Balance Score of > 50/56 as to demonstrate improved balance with ADLs such as sitting/standing and transfer balance and reduced fall risk.  Baseline 01/09/2022= 47/56; 04/12/2022= 53/56  Time 12   Period Weeks   Status Met  Target Date 04/03/2022   PT LONG TERM GOAL #9  Title Pt will increase BUE shoulder/elbow strength of  by at least 1/2 MMT grade in order to demonstrate improvement in strength and function    Baseline 04/13/2022= BUE = 4/5 throughtout  Time 12   Period Weeks   Status NEW  Target Date 07/05/2022        Plan -      Clinical Impression Statement Treatment continued to focus on both UE/LE strengthening and patient performed well overal- Able to increase some resistance and take some longer steps for improved gait efficiency today. He was able to utilize RPE scale well today as not over extend his exertion levels. Patient will continue to benefit from skilled PT services for improved functional strength and mobility until goals have been met. Will continue to modify session length/intensity as needed due to fluctuating comorbidities.    Personal Factors and Comorbidities Comorbidity 3+    Comorbidities DM, Liver transplant, ESRD, CAD, feeding tube    Examination-Activity Limitations Carry;Lift;Reach Overhead;Squat;Stairs;Stand;Transfers;Toileting    Examination-Participation Restrictions Cleaning;Community Activity;Driving;Medication Management;Meal Prep;Yard Work    [employer] Evolving/Moderate complexity    Rehab Potential Fair    PT Frequency 2x / week    PT Duration 12 weeks    PT Treatment/Interventions ADLs/Self Care Home Management;Cryotherapy;Moist Heat;DME Instruction;Gait training;Stair training;Functional mobility training;Therapeutic activities;Therapeutic exercise;Balance training;Neuromuscular re-education;Patient/family education;Wheelchair mobility training;Manual techniques;Passive range of motion;Dry  needling;Energy conservation;Joint Manipulations    PT Next Visit Plan Progressive Therapeutic exercises, Balance training, transfer/gait training.    PT Home Exercise Plan No changes or updates today.    Consulted and Agree with Plan of Care Patient             Ollen Bowl, PT  Physical Therapist - [facility]      05/24/2022, 2:47 PM

## 2022-05-29 ENCOUNTER — Ambulatory Visit: Payer: Medicare Other

## 2022-05-29 DIAGNOSIS — R278 Other lack of coordination: Secondary | ICD-10-CM

## 2022-05-29 DIAGNOSIS — R269 Unspecified abnormalities of gait and mobility: Secondary | ICD-10-CM

## 2022-05-29 DIAGNOSIS — M6281 Muscle weakness (generalized): Secondary | ICD-10-CM

## 2022-05-29 DIAGNOSIS — R262 Difficulty in walking, not elsewhere classified: Secondary | ICD-10-CM | POA: Diagnosis not present

## 2022-05-29 DIAGNOSIS — R2681 Unsteadiness on feet: Secondary | ICD-10-CM

## 2022-05-29 DIAGNOSIS — R2689 Other abnormalities of gait and mobility: Secondary | ICD-10-CM

## 2022-05-29 NOTE — Therapy (Signed)
OUTPATIENT PHYSICAL THERAPY TREATMENT NOTE   Patient Name: Alex Hampton MRN: [id number] DOB:[birth year], 67 y.o., male Today's Date: 05/29/2022  PCP: Dr. Juluis Pitch REFERRING PROVIDER: Eulis Canner, MD   PT End of Session - 05/29/22 1349     Visit Number 94    Number of Visits 107    Date for PT Re-Evaluation 07/05/22    Authorization Type Medicare/BCBS; PN 03/23/21    Authorization Time Period 04/12/22-07/05/22    Progress Note Due on Visit 100    PT Start Time [id number]    PT Stop Time 1425    PT Time Calculation (min) 38 min    Activity Tolerance Patient tolerated treatment well;No increased pain;Patient limited by fatigue    Behavior During Therapy [facility] for tasks assessed/performed                Past Medical History:  Diagnosis Date   Depression    History of cardiac cath    History of heart artery stent    Hyperlipidemia    Hypertension    MI (myocardial infarction) ([place])    Renal disorder    Past Surgical History:  Procedure Laterality Date   IR GJ TUBE CHANGE  11/22/2020   IR [place] TUBE CHANGE  11/26/2020   IR [place] GASTRO/COLONIC TUBE PERCUT W/FLUORO  10/14/2020   LEFT HEART CATH AND CORONARY ANGIOGRAPHY N/A 08/19/2020   Procedure: LEFT HEART CATH AND CORONARY ANGIOGRAPHY;  Surgeon: Corey Skains, MD;  Location: [place] [facility];  Service: Cardiovascular;  Laterality: N/A;   LIVER TRANSPLANT     Patient Active Problem List   Diagnosis Date Noted   Pulmonary embolism ([place]) 08/08/2021   CAP (community acquired pneumonia) 08/08/2021   Protein calorie malnutrition ([place]) 08/08/2021   Hypokalemia 08/08/2021   Pancreatic cyst    Verbal auditory hallucination    Early satiety    Feeding tube dysfunction    Generalized weakness 11/26/2020   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[place]) 11/26/2020   Recurrent falls 11/26/2020   Syncope 10/15/2020   Uremia 10/10/2020   ESRD on hemodialysis ([place]) 10/10/2020   Hyperkalemia 10/10/2020   Elevated  troponin 10/10/2020   GERD (gastroesophageal reflux disease) 10/10/2020   CAD (coronary artery disease) 10/10/2020   G tube feedings ([place]) 10/10/2020   Diarrhea 10/10/2020   Diabetes mellitus ([place]) 10/10/2020   Weakness    ACS (acute coronary syndrome) ([place])    Liver transplant recipient [facility])    Anemia of chronic disease    Type 2 diabetes mellitus with diabetic neuropathy, with long-term current use of insulin ([place])    ESRD (end stage renal disease) ([place]) 08/19/2020   History of anemia due to chronic kidney disease 08/19/2020   Gait abnormality 10/17/2019   History of fall within past 90 days 10/17/2019   Visual hallucination 10/17/2019   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 10/17/2019   HTN (hypertension) 10/17/2019   History of MI (myocardial infarction) 10/17/2019   OSA (obstructive sleep apnea) 10/17/2019   Nondisplaced comminuted supracondylar fracture without intercondylar fracture of left humerus, subsequent encounter for fracture with nonunion 10/17/2019   Anxiety and depression 10/17/2019    REFERRING DIAG: Other malaise Z94.4 (ICD-10-CM) - Liver transplant status R26.9 (ICD-10-CM) - Unspecified abnormalities of gait and mobility   THERAPY DIAG:  Difficulty in walking, not elsewhere classified  Muscle weakness (generalized)  Abnormality of gait and mobility  Other abnormalities of gait and mobility  Other lack of coordination  Unsteadiness on feet  Rationale for Evaluation and Treatment Rehabilitation  PERTINENT HISTORY: Patient is a 67 year old male with recent referral for abnomality of gait and recent liver transplant in November 2021. He has past medical history significant for Liver transplant (02/18/2020), CKD, End stage renal disease- On hemodialyis T/TH/Sat., Anemia, DM with neuropathy, Non-stemi, Coronary artery disease, Feeding tube with poor appetite, PE now on eliquis   PRECAUTIONS: none  SUBJECTIVE: Patient reports feeling tired- States has  not been sleeping well and going to message his medical team to see if he needs to perform some form of a sleep study.   PAIN:  Are you having pain? No   INTERVENTION  for 05/29/2022  THEREX:   2 min step test= 47 steps (counted every right LE) - O2 sat= 96% and HR 73 bpm  UE chair press - 10 reps (patient reported as medium)   Spilit squats 4 sets of 5 reps each LE  Wall push ups 2x12 reps  Short ambulation -10 M  1) normal pace and cadence= 8.69 sec 15 steps 2) VC for increased step length= 8.49 sec 15 steps   Sit to stand hands free x 10 reps (challenging to hard= 6/10 on RPE scale)    Standing Tricep press down 12.5 lb 2 sets of 12 reps.  Standing bicep curl @ 2.5 cablel 2 sets of 12 reps each UE.   Standing scap retraction using matrix cable system- 12.5# 2 sets of 12 reps.   Pt educated throughout session about proper posture and technique with exercises. Improved exercise technique, movement at target joints, use of target muscles after min to mod verbal, visual, tactile cues.     PATIENT EDUCATION: Education details: Exercise technique, progress towards remainder goals Person educated: Patient Education method: Explanation, Demonstration, Tactile cues, and Verbal cues Education comprehension: verbalized understanding, returned demonstration, verbal cues required, tactile cues required, and needs further education   HOME EXERCISE PROGRAM: No updates   PT Short Term Goals -      PT SHORT TERM GOAL #1   Title Pt will be independent with HEP in order to improve strength and balance in order to decrease fall risk and improve function at home and work.    Baseline 02/02/2021: Patient reports limited HEP in place currently from [facility] agency 12/14 compliance; 1/20/2023Patient states no questions regarding his current exercise regimen and states he is compliant.    Time 6    Period Weeks    Status Achieved    Target Date 03/16/21              PT Long Term  Goals -       PT LONG TERM GOAL #1   Title Pt will improve FOTO to target score of 50 to display perceived improvements in ability to complete ADL's.    Baseline 02/02/2021= 43 12/143: 51%; 04/29/2021= 53%; 09/09/2021= 53; 10/12/2021=54%   Time 12    Period Weeks    Status Achieved      PT LONG TERM GOAL #2   Title Pt will decrease 5TSTS by at least 8 seconds in order to demonstrate clinically significant improvement in LE strength.    Baseline 02/02/2021= 28.22 sec with BUE Support 12/14: 23.1 seconds; 04/29/2021=Unable to test today due to patient became nauseous with 6 min walk test and attempted TUG- unable to continue- will reassess next 1-2 visits; 06/08/2021= 25.1 sec with min BUE support; 07/20/2021= 17.75 with min BUE support    Time 12  Period Weeks    Status Achieved    Target Date 07/22/21      PT LONG TERM GOAL #3   Title Pt will decrease TUG to below 19 seconds/decrease in order to demonstrate decreased fall risk.    Baseline 02/02/2021= 27.30 sec without AD 12/14: 23 seconds; 04/29/2021- Unable to assess as patient became very nauseated upon standing and request to stop treatment. 06/08/2021= 19.10 sec without an AD; 07/20/2021= 15.68 sec without UE Support    Time 12    Period Weeks    Status Achieved    Target Date 07/22/21      PT LONG TERM GOAL #4   Title Pt will increase 6MWT by at least 74m(1659f in order to demonstrate clinically significant improvement in cardiopulmonary endurance and community ambulation    Baseline 02/02/2021= 83 feet in 1 min 10 sec wihtout an AD 12/14: 200 ft in  37m637mtes; 04/29/2021=Patient ambulated approx 60 feet yet had to stop due onset of nausea and unable to complete today; 07/15/2021= 330 feet in 2 min 25 sec without AD.  09/09/2021= 350 feet in 3 min 45 sec. 10/12/2021= 710 feet in 6 min- no AD (Short stand rest break)    Time 12    Period Weeks    Status Achieved   Target Date 07/22/21      PT LONG TERM GOAL #5   Title Pt will increase 10MWT by  at least 0.2 m/s in order to demonstrate clinically significant improvement in community ambulation.    Baseline 02/02/2021= 0.42 m/s 12/14: 0.49 m/s; 04/29/2021=Patient unable to test today secondary to being nauseated. 06/08/2021= 0.62 m/s; 07/20/2021= 0.87 m/s    Time 12    Period Weeks    Status Achieved    Target Date 07/22/21      PT LONG TERM GOAL #6   Title Pt will decrease 5TSTS by at least 3 seconds in order to demonstrate clinically significant improvement in LE strength.    Baseline 07/20/2021= 17.75 sec with Minimal BUE Support; 09/09/2021= 17.50 sec with very minimal UE Support. 10/12/2021= 14.90 sec with min BUE Support. 12/05/2021= 14.30 sec with BUE; 01/09/2022= 23.11 sec with min UE on thighs vs. Arm rests for more emphasis on BLE.  03/15/2022= 17.46 sec without UE Support. 04/11/2022= 15.22 sec without UE support   Time 12    Period Weeks    Status Ongoing   Target Date 07/05/2022   PT LONG TERM GOAL #7 Title:  Pt will increase 6MWT by at least 56m42m4ft20f order to demonstrate clinically significant improvement in cardiopulmonary endurance and community ambulation                                                       Baseline: 10/12/2021= 710 feet; 12/05/2021= 827 feet; 01/09/2022= 620 feet without an AD - stopping at 4 min mark due to self reported fatigue. 03/15/2022= 1040 feet; 04/13/2022= Patient too fatigued to attempt after other testing. Will assess next visit.  Goal status: ONGOING Target date: 07/05/2022        PT LONG TERM GOAL #8  Title Patient will demonstrate an improved Berg Balance Score of > 50/56 as to demonstrate improved balance with ADLs such as sitting/standing and transfer balance and reduced fall risk.  Baseline 01/09/2022= 47/56; 04/12/2022= 53/56  Time 12  Period Weeks   Status Met  Target Date 04/03/2022   PT LONG TERM GOAL #9  Title Pt will increase BUE shoulder/elbow strength of  by at least 1/2 MMT grade in order to demonstrate improvement in strength and  function    Baseline 04/13/2022= BUE = 4/5 throughtout  Time 12   Period Weeks   Status NEW  Target Date 07/05/2022        Plan -      Clinical Impression Statement Patient presented initially reporting as fatigued but performed well overall - able to participate well with UE/LE well today with minimal VC for best technique. Patient continues to to require close monitoring to not overexert himself but overall progressing with increased exercises/activities today.  Patient will continue to benefit from skilled PT services for improved functional strength and mobility until goals have been met. Will continue to modify session length/intensity as needed due to fluctuating comorbidities.    Personal Factors and Comorbidities Comorbidity 3+    Comorbidities DM, Liver transplant, ESRD, CAD, feeding tube    Examination-Activity Limitations Carry;Lift;Reach Overhead;Squat;Stairs;Stand;Transfers;Toileting    Examination-Participation Restrictions Cleaning;Community Activity;Driving;Medication Management;Meal Prep;Yard Work    [employer] Evolving/Moderate complexity    Rehab Potential Fair    PT Frequency 2x / week    PT Duration 12 weeks    PT Treatment/Interventions ADLs/Self Care Home Management;Cryotherapy;Moist Heat;DME Instruction;Gait training;Stair training;Functional mobility training;Therapeutic activities;Therapeutic exercise;Balance training;Neuromuscular re-education;Patient/family education;Wheelchair mobility training;Manual techniques;Passive range of motion;Dry needling;Energy conservation;Joint Manipulations    PT Next Visit Plan Progressive Therapeutic exercises, Balance training, transfer/gait training.    PT Home Exercise Plan No changes or updates today.    Consulted and Agree with Plan of Care Patient             Ollen Bowl, PT  Physical Therapist - [facility]      05/29/2022, 4:24 PM

## 2022-05-31 ENCOUNTER — Ambulatory Visit: Payer: Medicare Other

## 2022-05-31 DIAGNOSIS — R2689 Other abnormalities of gait and mobility: Secondary | ICD-10-CM

## 2022-05-31 DIAGNOSIS — R262 Difficulty in walking, not elsewhere classified: Secondary | ICD-10-CM

## 2022-05-31 DIAGNOSIS — R269 Unspecified abnormalities of gait and mobility: Secondary | ICD-10-CM

## 2022-05-31 DIAGNOSIS — R2681 Unsteadiness on feet: Secondary | ICD-10-CM

## 2022-05-31 DIAGNOSIS — M6281 Muscle weakness (generalized): Secondary | ICD-10-CM

## 2022-05-31 DIAGNOSIS — R278 Other lack of coordination: Secondary | ICD-10-CM

## 2022-05-31 DIAGNOSIS — R296 Repeated falls: Secondary | ICD-10-CM

## 2022-05-31 NOTE — Therapy (Signed)
OUTPATIENT PHYSICAL THERAPY TREATMENT NOTE   Patient Name: Alex Hampton MRN: [id number] DOB:07/08/1955, 67 y.o., male Today's Date: 05/31/2022  PCP: Dr. Juluis Pitch REFERRING PROVIDER: Eulis Canner, MD   PT End of Session - 05/31/22 1355     Visit Number 95    Number of Visits 107    Date for PT Re-Evaluation 07/05/22    Authorization Type Medicare/BCBS; PN 03/23/21    Authorization Time Period 04/12/22-07/05/22    Progress Note Due on Visit 100    PT Start Time [id number]    PT Stop Time 1422    PT Time Calculation (min) 35 min    Activity Tolerance Patient tolerated treatment well;No increased pain;Patient limited by fatigue    Behavior During Therapy [facility] for tasks assessed/performed                Past Medical History:  Diagnosis Date   Depression    History of cardiac cath    History of heart artery stent    Hyperlipidemia    Hypertension    MI (myocardial infarction) ([place])    Renal disorder    Past Surgical History:  Procedure Laterality Date   IR [place] TUBE CHANGE  11/22/2020   IR [place] TUBE CHANGE  11/26/2020   IR [place] GASTRO/COLONIC TUBE PERCUT W/FLUORO  10/14/2020   LEFT HEART CATH AND CORONARY ANGIOGRAPHY N/A 08/19/2020   Procedure: LEFT HEART CATH AND CORONARY ANGIOGRAPHY;  Surgeon: Corey Skains, MD;  Location: [place] [facility];  Service: Cardiovascular;  Laterality: N/A;   LIVER TRANSPLANT     Patient Active Problem List   Diagnosis Date Noted   Pulmonary embolism ([place]) 08/08/2021   CAP (community acquired pneumonia) 08/08/2021   Protein calorie malnutrition ([place]) 08/08/2021   Hypokalemia 08/08/2021   Pancreatic cyst    Verbal auditory hallucination    Early satiety    Feeding tube dysfunction    Generalized weakness 11/26/2020   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[place]) 11/26/2020   Recurrent falls 11/26/2020   Syncope 10/15/2020   Uremia 10/10/2020   ESRD on hemodialysis ([place]) 10/10/2020   Hyperkalemia 10/10/2020   Elevated  troponin 10/10/2020   GERD (gastroesophageal reflux disease) 10/10/2020   CAD (coronary artery disease) 10/10/2020   G tube feedings ([place]) 10/10/2020   Diarrhea 10/10/2020   Diabetes mellitus ([place]) 10/10/2020   Weakness    ACS (acute coronary syndrome) ([place])    Liver transplant recipient [facility])    Anemia of chronic disease    Type 2 diabetes mellitus with diabetic neuropathy, with long-term current use of insulin ([place])    ESRD (end stage renal disease) ([place]) 08/19/2020   History of anemia due to chronic kidney disease 08/19/2020   Gait abnormality 10/17/2019   History of fall within past 90 days 10/17/2019   Visual hallucination 10/17/2019   Cirrhosis of liver with ascites ([place]) on CT 10/17/2019   HTN (hypertension) 10/17/2019   History of MI (myocardial infarction) 10/17/2019   OSA (obstructive sleep apnea) 10/17/2019   Nondisplaced comminuted supracondylar fracture without intercondylar fracture of left humerus, subsequent encounter for fracture with nonunion 10/17/2019   Anxiety and depression 10/17/2019    REFERRING DIAG: Other malaise Z94.4 (ICD-10-CM) - Liver transplant status R26.9 (ICD-10-CM) - Unspecified abnormalities of gait and mobility   THERAPY DIAG:  Difficulty in walking, not elsewhere classified  Muscle weakness (generalized)  Abnormality of gait and mobility  Other abnormalities of gait and mobility  Other lack of coordination  Unsteadiness on feet  Repeated falls  Rationale for Evaluation and Treatment Rehabilitation  PERTINENT HISTORY: Patient is a 67 year old male with recent referral for abnomality of gait and recent liver transplant in November 2021. He has past medical history significant for Liver transplant (02/18/2020), CKD, End stage renal disease- On hemodialyis T/TH/Sat., Anemia, DM with neuropathy, Non-stemi, Coronary artery disease, Feeding tube with poor appetite, PE now on eliquis   PRECAUTIONS: none  SUBJECTIVE: Patient reports feeling  okay. Denies any new problems or issues. Marland Kitchen   PAIN:  Are you having pain? No   INTERVENTION  for 05/31/2022  THEREX:    Standing Hip ext using matrix cable system using 7.5#-2 x 10  Standing scap retraction using matrix cable system- 12.5# - 2x12  *Patient then reported feeling nauseous- requiring rest then requested bathroom break.  Standing Hip abd using matrix cable system-  1 set of 10 reps each LE - Reported as difficult.  Near lat pull down/High row- Matrix cable from height set at 18- 12.5# x 10 reps x 2 sets  Seated ham curl with 7.5 # matrix cable system x 12 reps   *Patient stopped secondary to fatigue.   Pt educated throughout session about proper posture and technique with exercises. Improved exercise technique, movement at target joints, use of target muscles after min to mod verbal, visual, tactile cues.     PATIENT EDUCATION: Education details: Exercise technique, progress towards remainder goals Person educated: Patient Education method: Explanation, Demonstration, Tactile cues, and Verbal cues Education comprehension: verbalized understanding, returned demonstration, verbal cues required, tactile cues required, and needs further education   HOME EXERCISE PROGRAM: No updates   PT Short Term Goals -      PT SHORT TERM GOAL #1   Title Pt will be independent with HEP in order to improve strength and balance in order to decrease fall risk and improve function at home and work.    Baseline 02/02/2021: Patient reports limited HEP in place currently from [facility] agency 12/14 compliance; 1/20/2023Patient states no questions regarding his current exercise regimen and states he is compliant.    Time 6    Period Weeks    Status Achieved    Target Date 03/16/21              PT Long Term Goals -       PT LONG TERM GOAL #1   Title Pt will improve FOTO to target score of 50 to display perceived improvements in ability to complete ADL's.    Baseline 02/02/2021=  43 12/143: 51%; 04/29/2021= 53%; 09/09/2021= 53; 10/12/2021=54%   Time 12    Period Weeks    Status Achieved      PT LONG TERM GOAL #2   Title Pt will decrease 5TSTS by at least 8 seconds in order to demonstrate clinically significant improvement in LE strength.    Baseline 02/02/2021= 28.22 sec with BUE Support 12/14: 23.1 seconds; 04/29/2021=Unable to test today due to patient became nauseous with 6 min walk test and attempted TUG- unable to continue- will reassess next 1-2 visits; 06/08/2021= 25.1 sec with min BUE support; 07/20/2021= 17.75 with min BUE support    Time 12    Period Weeks    Status Achieved    Target Date 07/22/21      PT LONG TERM GOAL #3   Title Pt will decrease TUG to below 19 seconds/decrease in order to demonstrate decreased fall risk.    Baseline 02/02/2021= 27.30 sec without AD 12/14: 23  seconds; 04/29/2021- Unable to assess as patient became very nauseated upon standing and request to stop treatment. 06/08/2021= 19.10 sec without an AD; 07/20/2021= 15.68 sec without UE Support    Time 12    Period Weeks    Status Achieved    Target Date 07/22/21      PT LONG TERM GOAL #4   Title Pt will increase 6MWT by at least 40m(1643f in order to demonstrate clinically significant improvement in cardiopulmonary endurance and community ambulation    Baseline 02/02/2021= 83 feet in 1 min 10 sec wihtout an AD 12/14: 200 ft in  24m37mtes; 04/29/2021=Patient ambulated approx 60 feet yet had to stop due onset of nausea and unable to complete today; 07/15/2021= 330 feet in 2 min 25 sec without AD.  09/09/2021= 350 feet in 3 min 45 sec. 10/12/2021= 710 feet in 6 min- no AD (Short stand rest break)    Time 12    Period Weeks    Status Achieved   Target Date 07/22/21      PT LONG TERM GOAL #5   Title Pt will increase 10MWT by at least 0.2 m/s in order to demonstrate clinically significant improvement in community ambulation.    Baseline 02/02/2021= 0.42 m/s 12/14: 0.49 m/s; 04/29/2021=Patient unable  to test today secondary to being nauseated. 06/08/2021= 0.62 m/s; 07/20/2021= 0.87 m/s    Time 12    Period Weeks    Status Achieved    Target Date 07/22/21      PT LONG TERM GOAL #6   Title Pt will decrease 5TSTS by at least 3 seconds in order to demonstrate clinically significant improvement in LE strength.    Baseline 07/20/2021= 17.75 sec with Minimal BUE Support; 09/09/2021= 17.50 sec with very minimal UE Support. 10/12/2021= 14.90 sec with min BUE Support. 12/05/2021= 14.30 sec with BUE; 01/09/2022= 23.11 sec with min UE on thighs vs. Arm rests for more emphasis on BLE.  03/15/2022= 17.46 sec without UE Support. 04/11/2022= 15.22 sec without UE support   Time 12    Period Weeks    Status Ongoing   Target Date 07/05/2022   PT LONG TERM GOAL #7 Title:  Pt will increase 6MWT by at least [id number] order to demonstrate clinically significant improvement in cardiopulmonary endurance and community ambulation                                                       Baseline: 10/12/2021= 710 feet; 12/05/2021= 827 feet; 01/09/2022= 620 feet without an AD - stopping at 4 min mark due to self reported fatigue. 03/15/2022= 1040 feet; 04/13/2022= Patient too fatigued to attempt after other testing. Will assess next visit.  Goal status: ONGOING Target date: 07/05/2022        PT LONG TERM GOAL #8  Title Patient will demonstrate an improved Berg Balance Score of > 50/56 as to demonstrate improved balance with ADLs such as sitting/standing and transfer balance and reduced fall risk.  Baseline 01/09/2022= 47/56; 04/12/2022= 53/56  Time 12   Period Weeks   Status Met  Target Date 04/03/2022   PT LONG TERM GOAL #9  Title Pt will increase BUE shoulder/elbow strength of  by at least 1/2 MMT grade in order to demonstrate improvement in strength and function    Baseline 04/13/2022= BUE = 4/5  throughtout  Time 12   Period Weeks   Status NEW  Target Date 07/05/2022        Plan -      Clinical Impression Statement  Patient presented well initially performing several rounds of standing postural and LE strengthening exercises but became nauseous today and was able to come back and perform a few more exercise but ultimately stopped due to undue fatigue.  Patient will continue to benefit from skilled PT services for improved functional strength and mobility until goals have been met. Will continue to modify session length/intensity as needed due to fluctuating comorbidities.    Personal Factors and Comorbidities Comorbidity 3+    Comorbidities DM, Liver transplant, ESRD, CAD, feeding tube    Examination-Activity Limitations Carry;Lift;Reach Overhead;Squat;Stairs;Stand;Transfers;Toileting    Examination-Participation Restrictions Cleaning;Community Activity;Driving;Medication Management;Meal Prep;Yard Work    [employer] officer Evolving/Moderate complexity    Rehab Potential Fair    PT Frequency 2x / week    PT Duration 12 weeks    PT Treatment/Interventions ADLs/Self Care Home Management;Cryotherapy;Moist Heat;DME Instruction;Gait training;Stair training;Functional mobility training;Therapeutic activities;Therapeutic exercise;Balance training;Neuromuscular re-education;Patient/family education;Wheelchair mobility training;Manual techniques;Passive range of motion;Dry needling;Energy conservation;Joint Manipulations    PT Next Visit Plan Progressive Therapeutic exercises, Balance training, transfer/gait training.    PT Home Exercise Plan No changes or updates today.    Consulted and Agree with Plan of Care Patient             Ollen Bowl, PT  Physical Therapist - [facility]      05/31/2022, 4:07 PM

## 2022-06-05 ENCOUNTER — Ambulatory Visit: Payer: Medicare Other

## 2022-06-05 NOTE — Therapy (Incomplete)
OUTPATIENT PHYSICAL THERAPY TREATMENT NOTE   Patient Name: Alex Hampton MRN: [id number] DOB:September 14, 1955, 67 y.o., male Today's Date: 06/05/2022  PCP: Dr. Juluis Pitch REFERRING PROVIDER: Eulis Canner, MD        Past Medical History:  Diagnosis Date   Depression    History of cardiac cath    History of heart artery stent    Hyperlipidemia    Hypertension    MI (myocardial infarction) [facility])    Renal disorder    Past Surgical History:  Procedure Laterality Date   IR [place] TUBE CHANGE  11/22/2020   IR [place] TUBE CHANGE  11/26/2020   IR [place] GASTRO/COLONIC TUBE PERCUT W/FLUORO  10/14/2020   LEFT HEART CATH AND CORONARY ANGIOGRAPHY N/A 08/19/2020   Procedure: LEFT HEART CATH AND CORONARY ANGIOGRAPHY;  Surgeon: Corey Skains, MD;  Location: [place] [facility];  Service: Cardiovascular;  Laterality: N/A;   LIVER TRANSPLANT     Patient Active Problem List   Diagnosis Date Noted   Pulmonary embolism ([place]) 08/08/2021   CAP (community acquired pneumonia) 08/08/2021   Protein calorie malnutrition ([place]) 08/08/2021   Hypokalemia 08/08/2021   Pancreatic cyst    Verbal auditory hallucination    Early satiety    Feeding tube dysfunction    Generalized weakness 11/26/2020   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[place]) 11/26/2020   Recurrent falls 11/26/2020   Syncope 10/15/2020   Uremia 10/10/2020   ESRD on hemodialysis ([place]) 10/10/2020   Hyperkalemia 10/10/2020   Elevated troponin 10/10/2020   GERD (gastroesophageal reflux disease) 10/10/2020   CAD (coronary artery disease) 10/10/2020   G tube feedings ([place]) 10/10/2020   Diarrhea 10/10/2020   Diabetes mellitus ([place]) 10/10/2020   Weakness    ACS (acute coronary syndrome) ([place])    Liver transplant recipient [facility])    Anemia of chronic disease    Type 2 diabetes mellitus with diabetic neuropathy, with long-term current use of insulin ([place])    ESRD (end stage renal disease) ([place]) 08/19/2020   History of anemia due to  chronic kidney disease 08/19/2020   Gait abnormality 10/17/2019   History of fall within past 90 days 10/17/2019   Visual hallucination 10/17/2019   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  Rationale for Evaluation and Treatment Rehabilitation  PERTINENT HISTORY: Patient is a 67 year old male with recent referral for abnomality of gait and recent liver transplant in November 2021. He has past medical history significant for Liver transplant (02/18/2020), CKD, End stage renal disease- On hemodialyis T/TH/Sat., Anemia, DM with neuropathy, Non-stemi, Coronary artery disease, Feeding tube with poor appetite, PE now on eliquis   PRECAUTIONS: none  SUBJECTIVE: ***   PAIN:  Are you having pain? No   INTERVENTION  for 05/31/2022  THEREX:    Standing Hip ext using matrix cable system using 7.5#-2 x 10  Standing scap retraction using matrix cable system- 12.5# - 2x12  *Patient then reported feeling nauseous- requiring rest then requested bathroom break.  Standing Hip abd using matrix cable system-  1 set of  10 reps each LE - Reported as difficult.  Near lat pull down/High row- Matrix cable from height set at 18- 12.5# x 10 reps x 2 sets  Seated ham curl with 7.5 # matrix cable system x 12 reps   *Patient stopped secondary to fatigue.   Pt educated throughout session about proper posture and technique with exercises. Improved exercise technique, movement at target joints,  use of target muscles after min to mod verbal, visual, tactile cues.     PATIENT EDUCATION: Education details: Exercise technique, progress towards remainder goals Person educated: Patient Education method: Explanation, Demonstration, Tactile cues, and Verbal cues Education comprehension: verbalized understanding, returned demonstration, verbal cues required, tactile cues required, and needs further education   HOME EXERCISE PROGRAM: No updates   PT Short Term Goals -      PT SHORT TERM GOAL #1   Title Pt will be independent with HEP in order to improve strength and balance in order to decrease fall risk and improve function at home and work.    Baseline 02/02/2021: Patient reports limited HEP in place currently from [facility] agency 12/14 compliance; 1/20/2023Patient states no questions regarding his current exercise regimen and states he is compliant.    Time 6    Period Weeks    Status Achieved    Target Date 03/16/21              PT Long Term Goals -       PT LONG TERM GOAL #1   Title Pt will improve FOTO to target score of 50 to display perceived improvements in ability to complete ADL's.    Baseline 02/02/2021= 43 12/143: 51%; 04/29/2021= 53%; 09/09/2021= 53; 10/12/2021=54%   Time 12    Period Weeks    Status Achieved      PT LONG TERM GOAL #2   Title Pt will decrease 5TSTS by at least 8 seconds in order to demonstrate clinically significant improvement in LE strength.    Baseline 02/02/2021= 28.22 sec with BUE Support 12/14: 23.1 seconds; 04/29/2021=Unable to test today due to patient became nauseous with 6 min walk test and attempted TUG- unable to continue- will reassess next 1-2 visits; 06/08/2021= 25.1 sec with min BUE support; 07/20/2021= 17.75 with min BUE support    Time 12    Period Weeks    Status Achieved    Target Date 07/22/21      PT LONG TERM GOAL #3   Title Pt will decrease TUG to below 19 seconds/decrease in order to demonstrate decreased fall risk.     Baseline 02/02/2021= 27.30 sec without AD 12/14: 23 seconds; 04/29/2021- Unable to assess as patient became very nauseated upon standing and request to stop treatment. 06/08/2021= 19.10 sec without an AD; 07/20/2021= 15.68 sec without UE Support    Time 12    Period Weeks    Status Achieved    Target Date 07/22/21      PT LONG TERM GOAL #4   Title Pt will increase 6MWT by at least 862m(167f in order to demonstrate clinically significant improvement in cardiopulmonary endurance and community ambulation    Baseline 02/02/2021= 83 feet in 1 min 10 sec wihtout an AD 12/14: 200 ft in  62m54mtes; 04/29/2021=Patient ambulated approx 60 feet yet had to stop due onset of nausea and unable to complete today; 07/15/2021= 330 feet in 2 min 25 sec without AD.  09/09/2021= 350 feet in 3 min 45 sec. 10/12/2021= 710 feet in 6 min- no AD (  Short stand rest break)    Time 12    Period Weeks    Status Achieved   Target Date 07/22/21      PT LONG TERM GOAL #5   Title Pt will increase 10MWT by at least 0.2 m/s in order to demonstrate clinically significant improvement in community ambulation.    Baseline 02/02/2021= 0.42 m/s 12/14: 0.49 m/s; 04/29/2021=Patient unable to test today secondary to being nauseated. 06/08/2021= 0.62 m/s; 07/20/2021= 0.87 m/s    Time 12    Period Weeks    Status Achieved    Target Date 07/22/21      PT LONG TERM GOAL #6   Title Pt will decrease 5TSTS by at least 3 seconds in order to demonstrate clinically significant improvement in LE strength.    Baseline 07/20/2021= 17.75 sec with Minimal BUE Support; 09/09/2021= 17.50 sec with very minimal UE Support. 10/12/2021= 14.90 sec with min BUE Support. 12/05/2021= 14.30 sec with BUE; 01/09/2022= 23.11 sec with min UE on thighs vs. Arm rests for more emphasis on BLE.  03/15/2022= 17.46 sec without UE Support. 04/11/2022= 15.22 sec without UE support   Time 12    Period Weeks    Status Ongoing   Target Date 07/05/2022   PT LONG TERM GOAL #7 Title:  Pt will  increase 6MWT by at least 73m(1658f in order to demonstrate clinically significant improvement in cardiopulmonary endurance and community ambulation                                                       Baseline: 10/12/2021= 710 feet; 12/05/2021= 827 feet; 01/09/2022= 620 feet without an AD - stopping at 4 min mark due to self reported fatigue. 03/15/2022= 1040 feet; 04/13/2022= Patient too fatigued to attempt after other testing. Will assess next visit.  Goal status: ONGOING Target date: 07/05/2022        PT LONG TERM GOAL #8  Title Patient will demonstrate an improved Berg Balance Score of > 50/56 as to demonstrate improved balance with ADLs such as sitting/standing and transfer balance and reduced fall risk.  Baseline 01/09/2022= 47/56; 04/12/2022= 53/56  Time 12   Period Weeks   Status Met  Target Date 04/03/2022   PT LONG TERM GOAL #9  Title Pt will increase BUE shoulder/elbow strength of  by at least 1/2 MMT grade in order to demonstrate improvement in strength and function    Baseline 04/13/2022= BUE = 4/5 throughtout  Time 12   Period Weeks   Status NEW  Target Date 07/05/2022        Plan -      Clinical Impression Statement Patient presented well initially performing several rounds of standing postural and LE strengthening exercises but became nauseous today and was able to come back and perform a few more exercise but ultimately stopped due to undue fatigue.  Patient will continue to benefit from skilled PT services for improved functional strength and mobility until goals have been met. Will continue to modify session length/intensity as needed due to fluctuating comorbidities.    Personal Factors and Comorbidities Comorbidity 3+    Comorbidities DM, Liver transplant, ESRD, CAD, feeding tube    Examination-Activity Limitations Carry;Lift;Reach Overhead;Squat;Stairs;Stand;Transfers;Toileting    Examination-Participation Restrictions Cleaning;Community Activity;Driving;Medication  Management;Meal Prep;Yard Work    [employer] officervolving/Moderate complexity  Rehab Potential Fair    PT Frequency 2x / week    PT Duration 12 weeks    PT Treatment/Interventions ADLs/Self Care Home Management;Cryotherapy;Moist Heat;DME Instruction;Gait training;Stair training;Functional mobility training;Therapeutic activities;Therapeutic exercise;Balance training;Neuromuscular re-education;Patient/family education;Wheelchair mobility training;Manual techniques;Passive range of motion;Dry needling;Energy conservation;Joint Manipulations    PT Next Visit Plan Progressive Therapeutic exercises, Balance training, transfer/gait training.    PT Home Exercise Plan No changes or updates today.    Consulted and Agree with Plan of Care Patient             Ollen Bowl, PT  Physical Therapist - [facility]      06/05/2022, 9:05 AM

## 2022-06-07 ENCOUNTER — Ambulatory Visit: Payer: Medicare Other

## 2022-06-07 DIAGNOSIS — R262 Difficulty in walking, not elsewhere classified: Secondary | ICD-10-CM

## 2022-06-07 DIAGNOSIS — R278 Other lack of coordination: Secondary | ICD-10-CM

## 2022-06-07 DIAGNOSIS — R2689 Other abnormalities of gait and mobility: Secondary | ICD-10-CM

## 2022-06-07 DIAGNOSIS — R2681 Unsteadiness on feet: Secondary | ICD-10-CM

## 2022-06-07 DIAGNOSIS — R269 Unspecified abnormalities of gait and mobility: Secondary | ICD-10-CM

## 2022-06-07 DIAGNOSIS — M6281 Muscle weakness (generalized): Secondary | ICD-10-CM

## 2022-06-07 NOTE — Therapy (Signed)
OUTPATIENT PHYSICAL THERAPY TREATMENT NOTE   Patient Name: Alex Hampton MRN: [id number] DOB:11-19-1955, 67 y.o., male Today's Date: 06/08/2022  PCP: Dr. Juluis Pitch REFERRING PROVIDER: Eulis Canner, MD   PT End of Session - 06/07/22 1337     Visit Number 96    Number of Visits 107    Date for PT Re-Evaluation 07/05/22    Authorization Type Medicare/BCBS; PN 03/23/21    Authorization Time Period 04/12/22-07/05/22    Progress Note Due on Visit 100    PT Start Time 1345    PT Stop Time 1426    PT Time Calculation (min) 41 min    Activity Tolerance Patient tolerated treatment well;No increased pain;Patient limited by fatigue    Behavior During Therapy [facility] for tasks assessed/performed                 Past Medical History:  Diagnosis Date   Depression    History of cardiac cath    History of heart artery stent    Hyperlipidemia    Hypertension    MI (myocardial infarction) ([place])    Renal disorder    Past Surgical History:  Procedure Laterality Date   IR [place] TUBE CHANGE  11/22/2020   IR [name] TUBE CHANGE  11/26/2020   IR [place] GASTRO/COLONIC TUBE PERCUT W/FLUORO  10/14/2020   LEFT HEART CATH AND CORONARY ANGIOGRAPHY N/A 08/19/2020   Procedure: LEFT HEART CATH AND CORONARY ANGIOGRAPHY;  Surgeon: Corey Skains, MD;  Location: [place] [facility];  Service: Cardiovascular;  Laterality: N/A;   LIVER TRANSPLANT     Patient Active Problem List   Diagnosis Date Noted   Pulmonary embolism ([place]) 08/08/2021   CAP (community acquired pneumonia) 08/08/2021   Protein calorie malnutrition ([place]) 08/08/2021   Hypokalemia 08/08/2021   Pancreatic cyst    Verbal auditory hallucination    Early satiety    Feeding tube dysfunction    Generalized weakness 11/26/2020   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[place]) 11/26/2020   Recurrent falls 11/26/2020   Syncope 10/15/2020   Uremia 10/10/2020   ESRD on hemodialysis ([place]) 10/10/2020   Hyperkalemia 10/10/2020   Elevated  troponin 10/10/2020   GERD (gastroesophageal reflux disease) 10/10/2020   CAD (coronary artery disease) 10/10/2020   G tube feedings ([place]) 10/10/2020   Diarrhea 10/10/2020   Diabetes mellitus ([place]) 10/10/2020   Weakness    ACS (acute coronary syndrome) ([place])    Liver transplant recipient [facility])    Anemia of chronic disease    Type 2 diabetes mellitus with diabetic neuropathy, with long-term current use of insulin ([place])    ESRD (end stage renal disease) ([place]) 08/19/2020   History of anemia due to chronic kidney disease 08/19/2020   Gait abnormality 10/17/2019   History of fall within past 90 days 10/17/2019   Visual hallucination 10/17/2019   Cirrhosis of liver with ascites ([place]) on CT 10/17/2019   HTN (hypertension) 10/17/2019   History of MI (myocardial infarction) 10/17/2019   OSA (obstructive sleep apnea) 10/17/2019   Nondisplaced comminuted supracondylar fracture without intercondylar fracture of left humerus, subsequent encounter for fracture with nonunion 10/17/2019   Anxiety and depression 10/17/2019    REFERRING DIAG: Other malaise Z94.4 (ICD-10-CM) - Liver transplant status R26.9 (ICD-10-CM) - Unspecified abnormalities of gait and mobility   THERAPY DIAG:  Difficulty in walking, not elsewhere classified  Muscle weakness (generalized)  Other abnormalities of gait and mobility  Abnormality of gait and mobility  Other lack of coordination  Unsteadiness on  feet  Rationale for Evaluation and Treatment Rehabilitation  PERTINENT HISTORY: Patient is a 67 year old male with recent referral for abnomality of gait and recent liver transplant in November 2021. He has past medical history significant for Liver transplant (02/18/2020), CKD, End stage renal disease- On hemodialyis T/TH/Sat., Anemia, DM with neuropathy, Non-stemi, Coronary artery disease, Feeding tube with poor appetite, PE now on eliquis   PRECAUTIONS: none  SUBJECTIVE: Patient reports feeling fatigued but  better than Monday.    PAIN:  Are you having pain? No   INTERVENTION  for 06/07/2022  THEREX:    Sit to stand: 5 with 2kg ball arms outstretched and 5 more without any weight  Standing scap row with GTB while standing in tandem -2x 10 reps (switching LE after initial first 10 reps)   Standing shoulder ext with GTB while standing on airex pad with feet narrowed - 2 sets of 10 reps  Standing horizontal Shoulder abd with GTB - 2 sets of 10 reps  Standing calf raises on 1/2 foam using support bar - 2 sets of 12 reps  Ambu 4 min 750 feet without AD- Stopped secondary to fatigue.   Bicep curl standing (from full pronation into full supination) 4# x 10 Hammer curl 4# x 10 reps   Chair dips 2 sets of 10 reps   *Patient stopped secondary to fatigue.   Pt educated throughout session about proper posture and technique with exercises. Improved exercise technique, movement at target joints, use of target muscles after min to mod verbal, visual, tactile cues.     PATIENT EDUCATION: Education details: Exercise technique, progress towards remainder goals Person educated: Patient Education method: Explanation, Demonstration, Tactile cues, and Verbal cues Education comprehension: verbalized understanding, returned demonstration, verbal cues required, tactile cues required, and needs further education   HOME EXERCISE PROGRAM: No updates   PT Short Term Goals -      PT SHORT TERM GOAL #1   Title Pt will be independent with HEP in order to improve strength and balance in order to decrease fall risk and improve function at home and work.    Baseline 02/02/2021: Patient reports limited HEP in place currently from [facility] agency 12/14 compliance; 1/20/2023Patient states no questions regarding his current exercise regimen and states he is compliant.    Time 6    Period Weeks    Status Achieved    Target Date 03/16/21              PT Long Term Goals -       PT LONG TERM GOAL #1    Title Pt will improve FOTO to target score of 50 to display perceived improvements in ability to complete ADL's.    Baseline 02/02/2021= 43 12/143: 51%; 04/29/2021= 53%; 09/09/2021= 53; 10/12/2021=54%   Time 12    Period Weeks    Status Achieved      PT LONG TERM GOAL #2   Title Pt will decrease 5TSTS by at least 8 seconds in order to demonstrate clinically significant improvement in LE strength.    Baseline 02/02/2021= 28.22 sec with BUE Support 12/14: 23.1 seconds; 04/29/2021=Unable to test today due to patient became nauseous with 6 min walk test and attempted TUG- unable to continue- will reassess next 1-2 visits; 06/08/2021= 25.1 sec with min BUE support; 07/20/2021= 17.75 with min BUE support    Time 12    Period Weeks    Status Achieved    Target Date 07/22/21  PT LONG TERM GOAL #3   Title Pt will decrease TUG to below 19 seconds/decrease in order to demonstrate decreased fall risk.    Baseline 02/02/2021= 27.30 sec without AD 12/14: 23 seconds; 04/29/2021- Unable to assess as patient became very nauseated upon standing and request to stop treatment. 06/08/2021= 19.10 sec without an AD; 07/20/2021= 15.68 sec without UE Support    Time 12    Period Weeks    Status Achieved    Target Date 07/22/21      PT LONG TERM GOAL #4   Title Pt will increase 6MWT by at least 14m(1645f in order to demonstrate clinically significant improvement in cardiopulmonary endurance and community ambulation    Baseline 02/02/2021= 83 feet in 1 min 10 sec wihtout an AD 12/14: 200 ft in  74m45mtes; 04/29/2021=Patient ambulated approx 60 feet yet had to stop due onset of nausea and unable to complete today; 07/15/2021= 330 feet in 2 min 25 sec without AD.  09/09/2021= 350 feet in 3 min 45 sec. 10/12/2021= 710 feet in 6 min- no AD (Short stand rest break)    Time 12    Period Weeks    Status Achieved   Target Date 07/22/21      PT LONG TERM GOAL #5   Title Pt will increase 10MWT by at least 0.2 m/s in order to demonstrate  clinically significant improvement in community ambulation.    Baseline 02/02/2021= 0.42 m/s 12/14: 0.49 m/s; 04/29/2021=Patient unable to test today secondary to being nauseated. 06/08/2021= 0.62 m/s; 07/20/2021= 0.87 m/s    Time 12    Period Weeks    Status Achieved    Target Date 07/22/21      PT LONG TERM GOAL #6   Title Pt will decrease 5TSTS by at least 3 seconds in order to demonstrate clinically significant improvement in LE strength.    Baseline 07/20/2021= 17.75 sec with Minimal BUE Support; 09/09/2021= 17.50 sec with very minimal UE Support. 10/12/2021= 14.90 sec with min BUE Support. 12/05/2021= 14.30 sec with BUE; 01/09/2022= 23.11 sec with min UE on thighs vs. Arm rests for more emphasis on BLE.  03/15/2022= 17.46 sec without UE Support. 04/11/2022= 15.22 sec without UE support   Time 12    Period Weeks    Status Ongoing   Target Date 07/05/2022   PT LONG TERM GOAL #7 Title:  Pt will increase 6MWT by at least 97m35m4ft11f order to demonstrate clinically significant improvement in cardiopulmonary endurance and community ambulation                                                       Baseline: 10/12/2021= 710 feet; 12/05/2021= 827 feet; 01/09/2022= 620 feet without an AD - stopping at 4 min mark due to self reported fatigue. 03/15/2022= 1040 feet; 04/13/2022= Patient too fatigued to attempt after other testing. Will assess next visit. 06/07/2022= 750 feet in 4 min  Goal status: ONGOING Target date: 07/05/2022        PT LONG TERM GOAL #8  Title Patient will demonstrate an improved Berg Balance Score of > 50/56 as to demonstrate improved balance with ADLs such as sitting/standing and transfer balance and reduced fall risk.  Baseline 01/09/2022= 47/56; 04/12/2022= 53/56  Time 12   Period Weeks   Status Met  Target Date 04/03/2022  PT LONG TERM GOAL #9  Title Pt will increase BUE shoulder/elbow strength of  by at least 1/2 MMT grade in order to demonstrate improvement in strength and function     Baseline 04/13/2022= BUE = 4/5 throughtout  Time 12   Period Weeks   Status NEW  Target Date 07/05/2022        Plan -      Clinical Impression Statement Patient responded well to treatment today focusing on UE/postural/LE strengthening exercises. He was able to complete all exercises for full session today without complaint of nausea and with minimal rest breaks. He is standing better overall without UE support and no unsteadiness with ambulation- only fatigue as limiting factor.  Patient will continue to benefit from skilled PT services for improved functional strength and mobility until goals have been met. Will continue to modify session length/intensity as needed due to fluctuating comorbidities.    Personal Factors and Comorbidities Comorbidity 3+    Comorbidities DM, Liver transplant, ESRD, CAD, feeding tube    Examination-Activity Limitations Carry;Lift;Reach Overhead;Squat;Stairs;Stand;Transfers;Toileting    Examination-Participation Restrictions Cleaning;Community Activity;Driving;Medication Management;Meal Prep;Yard Work    [employer] officer Evolving/Moderate complexity    Rehab Potential Fair    PT Frequency 2x / week    PT Duration 12 weeks    PT Treatment/Interventions ADLs/Self Care Home Management;Cryotherapy;Moist Heat;DME Instruction;Gait training;Stair training;Functional mobility training;Therapeutic activities;Therapeutic exercise;Balance training;Neuromuscular re-education;Patient/family education;Wheelchair mobility training;Manual techniques;Passive range of motion;Dry needling;Energy conservation;Joint Manipulations    PT Next Visit Plan Progressive Therapeutic exercises, Balance training, transfer/gait training.    PT Home Exercise Plan No changes or updates today.    Consulted and Agree with Plan of Care Patient             Ollen Bowl, PT  Physical Therapist - [facility]      06/08/2022, 8:21 AM

## 2022-06-12 ENCOUNTER — Ambulatory Visit: Payer: Medicare Other

## 2022-06-14 ENCOUNTER — Ambulatory Visit: Payer: Medicare Other | Attending: Gastroenterology

## 2022-06-14 DIAGNOSIS — R269 Unspecified abnormalities of gait and mobility: Secondary | ICD-10-CM | POA: Insufficient documentation

## 2022-06-14 DIAGNOSIS — R2689 Other abnormalities of gait and mobility: Secondary | ICD-10-CM | POA: Insufficient documentation

## 2022-06-14 DIAGNOSIS — M6281 Muscle weakness (generalized): Secondary | ICD-10-CM | POA: Diagnosis present

## 2022-06-14 DIAGNOSIS — R2681 Unsteadiness on feet: Secondary | ICD-10-CM | POA: Insufficient documentation

## 2022-06-14 DIAGNOSIS — R278 Other lack of coordination: Secondary | ICD-10-CM | POA: Insufficient documentation

## 2022-06-14 DIAGNOSIS — R262 Difficulty in walking, not elsewhere classified: Secondary | ICD-10-CM | POA: Insufficient documentation

## 2022-06-14 NOTE — Therapy (Signed)
OUTPATIENT PHYSICAL THERAPY TREATMENT NOTE   Patient Name: Alex Hampton MRN: [id number] DOB:Jun 13, 1955, 67 y.o., male Today's Date: 06/14/2022  PCP: Dr. Juluis Pitch REFERRING PROVIDER: Eulis Canner, MD   PT End of Session - 06/14/22 1342     Visit Number 97    Number of Visits 107    Date for PT Re-Evaluation 07/05/22    Authorization Type Medicare/BCBS; PN 03/23/21    Authorization Time Period 04/12/22-07/05/22    Progress Note Due on Visit 100    PT Start Time 1345    PT Stop Time 1411    PT Time Calculation (min) 26 min    Activity Tolerance Patient tolerated treatment well;Patient limited by fatigue    Behavior During Therapy [facility] for tasks assessed/performed                  Past Medical History:  Diagnosis Date   Depression    History of cardiac cath    History of heart artery stent    Hyperlipidemia    Hypertension    MI (myocardial infarction) ([place])    Renal disorder    Past Surgical History:  Procedure Laterality Date   IR GJ TUBE CHANGE  11/22/2020   IR [place] TUBE CHANGE  11/26/2020   IR [place] GASTRO/COLONIC TUBE PERCUT W/FLUORO  10/14/2020   LEFT HEART CATH AND CORONARY ANGIOGRAPHY N/A 08/19/2020   Procedure: LEFT HEART CATH AND CORONARY ANGIOGRAPHY;  Surgeon: Corey Skains, MD;  Location: [place] [facility];  Service: Cardiovascular;  Laterality: N/A;   LIVER TRANSPLANT     Patient Active Problem List   Diagnosis Date Noted   Pulmonary embolism ([place]) 08/08/2021   CAP (community acquired pneumonia) 08/08/2021   Protein calorie malnutrition ([place]) 08/08/2021   Hypokalemia 08/08/2021   Pancreatic cyst    Verbal auditory hallucination    Early satiety    Feeding tube dysfunction    Generalized weakness 11/26/2020   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[place]) 11/26/2020   Recurrent falls 11/26/2020   Syncope 10/15/2020   Uremia 10/10/2020   ESRD on hemodialysis ([place]) 10/10/2020   Hyperkalemia 10/10/2020   Elevated troponin 10/10/2020    GERD (gastroesophageal reflux disease) 10/10/2020   CAD (coronary artery disease) 10/10/2020   G tube feedings ([place]) 10/10/2020   Diarrhea 10/10/2020   Diabetes mellitus ([place]) 10/10/2020   Weakness    ACS (acute coronary syndrome) ([place])    Liver transplant recipient [facility])    Anemia of chronic disease    Type 2 diabetes mellitus with diabetic neuropathy, with long-term current use of insulin ([place])    ESRD (end stage renal disease) ([place]) 08/19/2020   History of anemia due to chronic kidney disease 08/19/2020   Gait abnormality 10/17/2019   History of fall within past 90 days 10/17/2019   Visual hallucination 10/17/2019   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 CT 10/17/2019   HTN (hypertension) 10/17/2019   History of MI (myocardial infarction) 10/17/2019   OSA (obstructive sleep apnea) 10/17/2019   Nondisplaced comminuted supracondylar fracture without intercondylar fracture of left humerus, subsequent encounter for fracture with nonunion 10/17/2019   Anxiety and depression 10/17/2019    REFERRING DIAG: Other malaise Z94.4 (ICD-10-CM) - Liver transplant status R26.9 (ICD-10-CM) - Unspecified abnormalities of gait and mobility   THERAPY DIAG:  Difficulty in walking, not elsewhere classified  Muscle weakness (generalized)  Other abnormalities of gait and mobility  Abnormality of gait and mobility  Other lack of coordination  Unsteadiness on feet  Rationale for Evaluation and Treatment Rehabilitation  PERTINENT HISTORY: Patient is a 67 year old male with recent referral for abnomality of gait and recent liver transplant in November 2021. He has past medical history significant for Liver transplant (02/18/2020), CKD, End stage renal disease- On hemodialyis T/TH/Sat., Anemia, DM with neuropathy, Non-stemi, Coronary artery disease, Feeding tube with poor appetite, PE now on eliquis   PRECAUTIONS: none  SUBJECTIVE:  Patient reports having a headache and feeling rough today.    PAIN:  Are you having pain? No   INTERVENTION  for 06/14/2022  THEREX:   Bicep curl standing matrix cable system 2.5# x 12 reps each LE  Tricep press down matrix cable system 7.5# x 12 reps each LE  Standing scap row with matrix cable system- 7.5 # 2 sets of 12 reps  Standing shoulder ext with matrix cable system 7.5 # 2 sets of 12 reps  Standing horizontal Shoulder abd with matrix cable system - 2 sets of 12 reps  Chest press matrix cable system 2.5# 1 sets of 12 reps  *Patient stopped secondary to fatigue.   Pt educated throughout session about proper posture and technique with exercises. Improved exercise technique, movement at target joints, use of target muscles after min to mod verbal, visual, tactile cues.     PATIENT EDUCATION: Education details: Exercise technique, progress towards remainder goals Person educated: Patient Education method: Explanation, Demonstration, Tactile cues, and Verbal cues Education comprehension: verbalized understanding, returned demonstration, verbal cues required, tactile cues required, and needs further education   HOME EXERCISE PROGRAM: No updates   PT Short Term Goals -      PT SHORT TERM GOAL #1   Title Pt will be independent with HEP in order to improve strength and balance in order to decrease fall risk and improve function at home and work.    Baseline 02/02/2021: Patient reports limited HEP in place currently from [facility] agency 12/14 compliance; 1/20/2023Patient states no questions regarding his current exercise regimen and states he is compliant.    Time 6    Period Weeks    Status Achieved    Target Date 03/16/21              PT Long Term Goals -       PT LONG TERM GOAL #1   Title Pt will improve FOTO to target score of 50 to display perceived improvements in ability to complete ADL's.    Baseline 02/02/2021= 43 12/143: 51%; 04/29/2021= 53%; 09/09/2021= 53; 10/12/2021=54%   Time 12    Period Weeks    Status Achieved       PT LONG TERM GOAL #2   Title Pt will decrease 5TSTS by at least 8 seconds in order to demonstrate clinically significant improvement in LE strength.    Baseline 02/02/2021= 28.22 sec with BUE Support 12/14: 23.1 seconds; 04/29/2021=Unable to test today due to patient became nauseous with 6 min walk test and attempted TUG- unable to continue- will reassess next 1-2 visits; 06/08/2021= 25.1 sec with min BUE support; 07/20/2021= 17.75 with min BUE support    Time 12    Period Weeks    Status Achieved    Target Date 07/22/21      PT LONG TERM GOAL #3   Title Pt will decrease TUG to below 19 seconds/decrease in order to demonstrate decreased fall risk.    Baseline 02/02/2021= 27.30 sec without AD 12/14: 23 seconds; 04/29/2021- Unable to assess as patient became very nauseated upon  standing and request to stop treatment. 06/08/2021= 19.10 sec without an AD; 07/20/2021= 15.68 sec without UE Support    Time 12    Period Weeks    Status Achieved    Target Date 07/22/21      PT LONG TERM GOAL #4   Title Pt will increase 6MWT by at least 738m(1661f in order to demonstrate clinically significant improvement in cardiopulmonary endurance and community ambulation    Baseline 02/02/2021= 83 feet in 1 min 10 sec wihtout an AD 12/14: 200 ft in  38m29mtes; 04/29/2021=Patient ambulated approx 60 feet yet had to stop due onset of nausea and unable to complete today; 07/15/2021= 330 feet in 2 min 25 sec without AD.  09/09/2021= 350 feet in 3 min 45 sec. 10/12/2021= 710 feet in 6 min- no AD (Short stand rest break)    Time 12    Period Weeks    Status Achieved   Target Date 07/22/21      PT LONG TERM GOAL #5   Title Pt will increase 10MWT by at least 0.2 m/s in order to demonstrate clinically significant improvement in community ambulation.    Baseline 02/02/2021= 0.42 m/s 12/14: 0.49 m/s; 04/29/2021=Patient unable to test today secondary to being nauseated. 06/08/2021= 0.62 m/s; 07/20/2021= 0.87 m/s    Time 12    Period  Weeks    Status Achieved    Target Date 07/22/21      PT LONG TERM GOAL #6   Title Pt will decrease 5TSTS by at least 3 seconds in order to demonstrate clinically significant improvement in LE strength.    Baseline 07/20/2021= 17.75 sec with Minimal BUE Support; 09/09/2021= 17.50 sec with very minimal UE Support. 10/12/2021= 14.90 sec with min BUE Support. 12/05/2021= 14.30 sec with BUE; 01/09/2022= 23.11 sec with min UE on thighs vs. Arm rests for more emphasis on BLE.  03/15/2022= 17.46 sec without UE Support. 04/11/2022= 15.22 sec without UE support   Time 12    Period Weeks    Status Ongoing   Target Date 07/05/2022   PT LONG TERM GOAL #7 Title:  Pt will increase 6MWT by at least 49m238m4ft24f order to demonstrate clinically significant improvement in cardiopulmonary endurance and community ambulation                                                       Baseline: 10/12/2021= 710 feet; 12/05/2021= 827 feet; 01/09/2022= 620 feet without an AD - stopping at 4 min mark due to self reported fatigue. 03/15/2022= 1040 feet; 04/13/2022= Patient too fatigued to attempt after other testing. Will assess next visit. 06/07/2022= 750 feet in 4 min  Goal status: ONGOING Target date: 07/05/2022        PT LONG TERM GOAL #8  Title Patient will demonstrate an improved Berg Balance Score of > 50/56 as to demonstrate improved balance with ADLs such as sitting/standing and transfer balance and reduced fall risk.  Baseline 01/09/2022= 47/56; 04/12/2022= 53/56  Time 12   Period Weeks   Status Met  Target Date 04/03/2022   PT LONG TERM GOAL #9  Title Pt will increase BUE shoulder/elbow strength of  by at least 1/2 MMT grade in order to demonstrate improvement in strength and function    Baseline 04/13/2022= BUE = 4/5 throughtout  Time 12  Period Weeks   Status NEW  Target Date 07/05/2022        Plan -      Clinical Impression Statement Treatment limited due to patient not feeling well but he performed as much as he  could- focusing on standing endurance while performing postural strengthening. Low weight yet 2 sets of 12 reps- he was moving through exercises but abruptly stopped stating he was too fatigued to continue. H Patient will continue to benefit from skilled PT services for improved functional strength, endurance, and mobility until goals have been met. Will continue to modify session length/intensity as needed due to fluctuating comorbidities.    Personal Factors and Comorbidities Comorbidity 3+    Comorbidities DM, Liver transplant, ESRD, CAD, feeding tube    Examination-Activity Limitations Carry;Lift;Reach Overhead;Squat;Stairs;Stand;Transfers;Toileting    Examination-Participation Restrictions Cleaning;Community Activity;Driving;Medication Management;Meal Prep;Yard Work    [employer] officer Evolving/Moderate complexity    Rehab Potential Fair    PT Frequency 2x / week    PT Duration 12 weeks    PT Treatment/Interventions ADLs/Self Care Home Management;Cryotherapy;Moist Heat;DME Instruction;Gait training;Stair training;Functional mobility training;Therapeutic activities;Therapeutic exercise;Balance training;Neuromuscular re-education;Patient/family education;Wheelchair mobility training;Manual techniques;Passive range of motion;Dry needling;Energy conservation;Joint Manipulations    PT Next Visit Plan Progressive Therapeutic exercises, Balance training, transfer/gait training.    PT Home Exercise Plan No changes or updates today.    Consulted and Agree with Plan of Care Patient             Ollen Bowl, PT  Physical Therapist - [facility]      06/14/2022, 2:18 PM

## 2022-06-19 ENCOUNTER — Ambulatory Visit: Payer: Medicare Other

## 2022-06-19 DIAGNOSIS — R2689 Other abnormalities of gait and mobility: Secondary | ICD-10-CM

## 2022-06-19 DIAGNOSIS — R262 Difficulty in walking, not elsewhere classified: Secondary | ICD-10-CM

## 2022-06-19 DIAGNOSIS — R278 Other lack of coordination: Secondary | ICD-10-CM

## 2022-06-19 DIAGNOSIS — R269 Unspecified abnormalities of gait and mobility: Secondary | ICD-10-CM

## 2022-06-19 DIAGNOSIS — R2681 Unsteadiness on feet: Secondary | ICD-10-CM

## 2022-06-19 DIAGNOSIS — M6281 Muscle weakness (generalized): Secondary | ICD-10-CM

## 2022-06-19 NOTE — Therapy (Signed)
OUTPATIENT PHYSICAL THERAPY TREATMENT NOTE   Patient Name: Alex Hampton MRN: [id number] DOB:March 18, 1956, 67 y.o., male Today's Date: 06/19/2022  PCP: Dr. Juluis Pitch REFERRING PROVIDER: Eulis Canner, MD   PT End of Session - 06/19/22 1410     Visit Number 98    Number of Visits 107    Date for PT Re-Evaluation 07/05/22    Authorization Type Medicare/BCBS; PN 03/23/21    Authorization Time Period 04/12/22-07/05/22    Progress Note Due on Visit 100    PT Start Time 1345    PT Stop Time 1413    PT Time Calculation (min) 28 min    Activity Tolerance Patient tolerated treatment well;Patient limited by fatigue    Behavior During Therapy [facility] for tasks assessed/performed                  Past Medical History:  Diagnosis Date   Depression    History of cardiac cath    History of heart artery stent    Hyperlipidemia    Hypertension    MI (myocardial infarction) ([place])    Renal disorder    Past Surgical History:  Procedure Laterality Date   IR [place] TUBE CHANGE  11/22/2020   IR [place] TUBE CHANGE  11/26/2020   IR [place] GASTRO/COLONIC TUBE PERCUT W/FLUORO  10/14/2020   LEFT HEART CATH AND CORONARY ANGIOGRAPHY N/A 08/19/2020   Procedure: LEFT HEART CATH AND CORONARY ANGIOGRAPHY;  Surgeon: Corey Skains, MD;  Location: [place] [facility];  Service: Cardiovascular;  Laterality: N/A;   LIVER TRANSPLANT     Patient Active Problem List   Diagnosis Date Noted   Pulmonary embolism ([place]) 08/08/2021   CAP (community acquired pneumonia) 08/08/2021   Protein calorie malnutrition ([place]) 08/08/2021   Hypokalemia 08/08/2021   Pancreatic cyst    Verbal auditory hallucination    Early satiety    Feeding tube dysfunction    Generalized weakness 11/26/2020   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[place]) 11/26/2020   Recurrent falls 11/26/2020   Syncope 10/15/2020   Uremia 10/10/2020   ESRD on hemodialysis ([place]) 10/10/2020   Hyperkalemia 10/10/2020   Elevated troponin  10/10/2020   GERD (gastroesophageal reflux disease) 10/10/2020   CAD (coronary artery disease) 10/10/2020   G tube feedings ([place]) 10/10/2020   Diarrhea 10/10/2020   Diabetes mellitus ([place]) 10/10/2020   Weakness    ACS (acute coronary syndrome) ([place])    Liver transplant recipient [facility])    Anemia of chronic disease    Type 2 diabetes mellitus with diabetic neuropathy, with long-term current use of insulin ([place])    ESRD (end stage renal disease) ([place]) 08/19/2020   History of anemia due to chronic kidney disease 08/19/2020   Gait abnormality 10/17/2019   History of fall within past 90 days 10/17/2019   Visual hallucination 10/17/2019   Cirrhosis of liver with ascites ([place]) on CT 10/17/2019   HTN (hypertension) 10/17/2019   History of MI (myocardial infarction) 10/17/2019   OSA (obstructive sleep apnea) 10/17/2019   Nondisplaced comminuted supracondylar fracture without intercondylar fracture of left humerus, subsequent encounter for fracture with nonunion 10/17/2019   Anxiety and depression 10/17/2019    REFERRING DIAG: Other malaise Z94.4 (ICD-10-CM) - Liver transplant status R26.9 (ICD-10-CM) - Unspecified abnormalities of gait and mobility   THERAPY DIAG:  Difficulty in walking, not elsewhere classified  Muscle weakness (generalized)  Other abnormalities of gait and mobility  Abnormality of gait and mobility  Other lack of coordination  Unsteadiness on feet  Rationale for Evaluation and Treatment Rehabilitation  PERTINENT HISTORY: Patient is a 67 year old male with recent referral for abnomality of gait and recent liver transplant in November 2021. He has past medical history significant for Liver transplant (02/18/2020), CKD, End stage renal disease- On hemodialyis T/TH/Sat., Anemia, DM with neuropathy, Non-stemi, Coronary artery disease, Feeding tube with poor appetite, PE now on eliquis   PRECAUTIONS: none  SUBJECTIVE:  Patient reports his sleep quality has been poor  for the last month leading to regression in his balance and energy levels. In process in getting CPAP. Pt is hopeful improving his sleep quality will help with his balance and strength,   PAIN:  Are you having pain? No   INTERVENTION  for 06/19/2022  THEREX:  THEREX:    Bicep curl standing matrix cable system 7.5# x 12 reps each LE   Tricep press down matrix cable system 7.5# x 12 reps each LE   Standing scap row with matrix cable system- 7.5 # 2 sets of 12 reps   Standing shoulder ext with matrix cable system 7.5 # 2 sets of 12 reps   Standing horizontal Shoulder abd with matrix cable system - 2 sets of 12 reps on RUE. 1 set of 12 on LUE due to pain from prior humerus fracture.    Chest press matrix cable system 2.5# 2 sets of 12 reps   2x5 STS with 2 KG med ball   x12 standing heel raises holding 2 KG med ball.     *Patient stopped secondary to fatigue.     Pt educated throughout session about proper posture and technique with exercises. Improved exercise technique, movement at target joints, use of target muscles after min to mod verbal, visual, tactile cues.    PATIENT EDUCATION: Education details: Exercise technique, progress towards remainder goals Person educated: Patient Education method: Explanation, Demonstration, Tactile cues, and Verbal cues Education comprehension: verbalized understanding, returned demonstration, verbal cues required, tactile cues required, and needs further education   HOME EXERCISE PROGRAM: No updates   PT Short Term Goals -      PT SHORT TERM GOAL #1   Title Pt will be independent with HEP in order to improve strength and balance in order to decrease fall risk and improve function at home and work.    Baseline 02/02/2021: Patient reports limited HEP in place currently from [facility] agency 12/14 compliance; 1/20/2023Patient states no questions regarding his current exercise regimen and states he is compliant.    Time 6    Period Weeks     Status Achieved    Target Date 03/16/21              PT Long Term Goals -       PT LONG TERM GOAL #1   Title Pt will improve FOTO to target score of 50 to display perceived improvements in ability to complete ADL's.    Baseline 02/02/2021= 43 12/143: 51%; 04/29/2021= 53%; 09/09/2021= 53; 10/12/2021=54%   Time 12    Period Weeks    Status Achieved      PT LONG TERM GOAL #2   Title Pt will decrease 5TSTS by at least 8 seconds in order to demonstrate clinically significant improvement in LE strength.    Baseline 02/02/2021= 28.22 sec with BUE Support 12/14: 23.1 seconds; 04/29/2021=Unable to test today due to patient became nauseous with 6 min walk test and attempted TUG- unable to continue- will reassess next 1-2 visits; 06/08/2021= 25.1 sec with min  BUE support; 07/20/2021= 17.75 with min BUE support    Time 12    Period Weeks    Status Achieved    Target Date 07/22/21      PT LONG TERM GOAL #3   Title Pt will decrease TUG to below 19 seconds/decrease in order to demonstrate decreased fall risk.    Baseline 02/02/2021= 27.30 sec without AD 12/14: 23 seconds; 04/29/2021- Unable to assess as patient became very nauseated upon standing and request to stop treatment. 06/08/2021= 19.10 sec without an AD; 07/20/2021= 15.68 sec without UE Support    Time 12    Period Weeks    Status Achieved    Target Date 07/22/21      PT LONG TERM GOAL #4   Title Pt will increase 6MWT by at least 54m(1654f in order to demonstrate clinically significant improvement in cardiopulmonary endurance and community ambulation    Baseline 02/02/2021= 83 feet in 1 min 10 sec wihtout an AD 12/14: 200 ft in  97m64mtes; 04/29/2021=Patient ambulated approx 60 feet yet had to stop due onset of nausea and unable to complete today; 07/15/2021= 330 feet in 2 min 25 sec without AD.  09/09/2021= 350 feet in 3 min 45 sec. 10/12/2021= 710 feet in 6 min- no AD (Short stand rest break)    Time 12    Period Weeks    Status Achieved   Target  Date 07/22/21      PT LONG TERM GOAL #5   Title Pt will increase 10MWT by at least 0.2 m/s in order to demonstrate clinically significant improvement in community ambulation.    Baseline 02/02/2021= 0.42 m/s 12/14: 0.49 m/s; 04/29/2021=Patient unable to test today secondary to being nauseated. 06/08/2021= 0.62 m/s; 07/20/2021= 0.87 m/s    Time 12    Period Weeks    Status Achieved    Target Date 07/22/21      PT LONG TERM GOAL #6   Title Pt will decrease 5TSTS by at least 3 seconds in order to demonstrate clinically significant improvement in LE strength.    Baseline 07/20/2021= 17.75 sec with Minimal BUE Support; 09/09/2021= 17.50 sec with very minimal UE Support. 10/12/2021= 14.90 sec with min BUE Support. 12/05/2021= 14.30 sec with BUE; 01/09/2022= 23.11 sec with min UE on thighs vs. Arm rests for more emphasis on BLE.  03/15/2022= 17.46 sec without UE Support. 04/11/2022= 15.22 sec without UE support   Time 12    Period Weeks    Status Ongoing   Target Date 07/05/2022   PT LONG TERM GOAL #7 Title:  Pt will increase 6MWT by at least [id number] order to demonstrate clinically significant improvement in cardiopulmonary endurance and community ambulation                                                       Baseline: 10/12/2021= 710 feet; 12/05/2021= 827 feet; 01/09/2022= 620 feet without an AD - stopping at 4 min mark due to self reported fatigue. 03/15/2022= 1040 feet; 04/13/2022= Patient too fatigued to attempt after other testing. Will assess next visit. 06/07/2022= 750 feet in 4 min  Goal status: ONGOING Target date: 07/05/2022        PT LONG TERM GOAL #8  Title Patient will demonstrate an improved Berg Balance Score of > 50/56 as to demonstrate improved  balance with ADLs such as sitting/standing and transfer balance and reduced fall risk.  Baseline 01/09/2022= 47/56; 04/12/2022= 53/56  Time 12   Period Weeks   Status Met  Target Date 04/03/2022   PT LONG TERM GOAL #9  Title Pt will increase BUE  shoulder/elbow strength of  by at least 1/2 MMT grade in order to demonstrate improvement in strength and function    Baseline 04/13/2022= BUE = 4/5 throughtout  Time 12   Period Weeks   Status NEW  Target Date 07/05/2022        Plan -      Clinical Impression Statement Pt motivated to participate with PT. Pt remains limited due to fatigue having to end session early. Focus on UE and LE strength with standing therex to continue to work on pt's standing tolerance. Pt does rely on frequent seated rest breaks throughout. Patient will continue to benefit from skilled PT services for improved functional strength, endurance, and mobility until goals have been met. Will continue to modify session length/intensity as needed due to fluctuating comorbidities.    Personal Factors and Comorbidities Comorbidity 3+    Comorbidities DM, Liver transplant, ESRD, CAD, feeding tube    Examination-Activity Limitations Carry;Lift;Reach Overhead;Squat;Stairs;Stand;Transfers;Toileting    Examination-Participation Restrictions Cleaning;Community Activity;Driving;Medication Management;Meal Prep;Yard Work    [employer] officer Evolving/Moderate complexity    Rehab Potential Fair    PT Frequency 2x / week    PT Duration 12 weeks    PT Treatment/Interventions ADLs/Self Care Home Management;Cryotherapy;Moist Heat;DME Instruction;Gait training;Stair training;Functional mobility training;Therapeutic activities;Therapeutic exercise;Balance training;Neuromuscular re-education;Patient/family education;Wheelchair mobility training;Manual techniques;Passive range of motion;Dry needling;Energy conservation;Joint Manipulations    PT Next Visit Plan Progressive Therapeutic exercises, Balance training, transfer/gait training.    PT Home Exercise Plan No changes or updates today.    Consulted and Agree with Plan of Care Patient            Salem Caster. Fairly IV, PT, DPT Physical Therapist- [facility]  06/19/2022, 2:28 PM

## 2022-06-21 ENCOUNTER — Ambulatory Visit: Payer: Medicare Other

## 2022-06-26 ENCOUNTER — Ambulatory Visit: Payer: Medicare Other

## 2022-06-26 DIAGNOSIS — R262 Difficulty in walking, not elsewhere classified: Secondary | ICD-10-CM | POA: Diagnosis not present

## 2022-06-26 DIAGNOSIS — R278 Other lack of coordination: Secondary | ICD-10-CM

## 2022-06-26 DIAGNOSIS — R2681 Unsteadiness on feet: Secondary | ICD-10-CM

## 2022-06-26 DIAGNOSIS — M6281 Muscle weakness (generalized): Secondary | ICD-10-CM

## 2022-06-26 DIAGNOSIS — R2689 Other abnormalities of gait and mobility: Secondary | ICD-10-CM

## 2022-06-26 DIAGNOSIS — R269 Unspecified abnormalities of gait and mobility: Secondary | ICD-10-CM

## 2022-06-26 NOTE — Therapy (Signed)
OUTPATIENT PHYSICAL THERAPY TREATMENT NOTE   Patient Name: Alex Hampton MRN: [id number] DOB:[birth year], 67 y.o., male Today's Date: 06/26/2022  PCP: Dr. Juluis Pitch REFERRING PROVIDER: Eulis Canner, MD   PT End of Session - 06/26/22 1352     Visit Number 33    Number of Visits 107    Date for PT Re-Evaluation 07/05/22    Authorization Type Medicare/BCBS; PN 03/23/21    Authorization Time Period 04/12/22-07/05/22    Progress Note Due on Visit 100    PT Start Time 1349    PT Stop Time 1427    PT Time Calculation (min) 38 min    Activity Tolerance Patient tolerated treatment well;Patient limited by fatigue    Behavior During Therapy [facility] for tasks assessed/performed                  Past Medical History:  Diagnosis Date   Depression    History of cardiac cath    History of heart artery stent    Hyperlipidemia    Hypertension    MI (myocardial infarction) ([place])    Renal disorder    Past Surgical History:  Procedure Laterality Date   IR GJ TUBE CHANGE  11/22/2020   IR [place] TUBE CHANGE  11/26/2020   IR [place] GASTRO/COLONIC TUBE PERCUT W/FLUORO  10/14/2020   LEFT HEART CATH AND CORONARY ANGIOGRAPHY N/A 08/19/2020   Procedure: LEFT HEART CATH AND CORONARY ANGIOGRAPHY;  Surgeon: Corey Skains, MD;  Location: [place] CV LAB;  Service: Cardiovascular;  Laterality: N/A;   LIVER TRANSPLANT     Patient Active Problem List   Diagnosis Date Noted   Pulmonary embolism ([place]) 08/08/2021   CAP (community acquired pneumonia) 08/08/2021   Protein calorie malnutrition ([place]) 08/08/2021   Hypokalemia 08/08/2021   Pancreatic cyst    Verbal auditory hallucination    Early satiety    Feeding tube dysfunction    Generalized weakness 11/26/2020   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[place]) 11/26/2020   Recurrent falls 11/26/2020   Syncope 10/15/2020   Uremia 10/10/2020   ESRD on hemodialysis ([place]) 10/10/2020   Hyperkalemia 10/10/2020   Elevated troponin  10/10/2020   GERD (gastroesophageal reflux disease) 10/10/2020   CAD (coronary artery disease) 10/10/2020   G tube feedings ([place]) 10/10/2020   Diarrhea 10/10/2020   Diabetes mellitus ([place]) 10/10/2020   Weakness    ACS (acute coronary syndrome) ([place])    Liver transplant recipient [facility])    Anemia of chronic disease    Type 2 diabetes mellitus with diabetic neuropathy, with long-term current use of insulin ([place])    ESRD (end stage renal disease) ([place]) 08/19/2020   History of anemia due to chronic kidney disease 08/19/2020   Gait abnormality 10/17/2019   History of fall within past 90 days 10/17/2019   Visual hallucination 10/17/2019   Cirrhosis of liver with ascites ([place]) on CT 10/17/2019   HTN (hypertension) 10/17/2019   History of MI (myocardial infarction) 10/17/2019   OSA (obstructive sleep apnea) 10/17/2019   Nondisplaced comminuted supracondylar fracture without intercondylar fracture of left humerus, subsequent encounter for fracture with nonunion 10/17/2019   Anxiety and depression 10/17/2019    REFERRING DIAG: Other malaise Z94.4 (ICD-10-CM) - Liver transplant status R26.9 (ICD-10-CM) - Unspecified abnormalities of gait and mobility   THERAPY DIAG:  Difficulty in walking, not elsewhere classified  Muscle weakness (generalized)  Other abnormalities of gait and mobility  Abnormality of gait and mobility  Other lack of coordination  Unsteadiness on feet  Rationale for Evaluation and Treatment Rehabilitation  PERTINENT HISTORY: Patient is a 67 year old male with recent referral for abnomality of gait and recent liver transplant in November 2021. He has past medical history significant for Liver transplant (02/18/2020), CKD, End stage renal disease- On hemodialyis T/TH/Sat., Anemia, DM with neuropathy, Non-stemi, Coronary artery disease, Feeding tube with poor appetite, PE now on eliquis   PRECAUTIONS: none  SUBJECTIVE:  Patient reports doing well - states he needs to  be ready for a 6 min walk test on Friday to determine if appropriate for kidney. He denies any pain. States he has a pending home sleep study on 08/14/2022   PAIN:  Are you having pain? No   INTERVENTION  for 06/26/2022    THEREX:     1 lap walk- holding onto 7# DB (150 feet)   6 min walk test (to prepare patient for testing with kidney specialist on Friday) = 1350 feet   Bicep curl hammer curl 5# x 12 reps each LE    Tricep kick back 3# x 12 reps each LE   Deadlift with 7# DB x 10 reps     STS with 2.5 dowel (raising below shoulder height) x 10 reps  Seated bicep (supination) curls x 10 reps with 5# DB  Stairs with BUE Support x 16 steps (up and down) with 1UE support x 2 trials.         Pt educated throughout session about proper posture and technique with exercises. Improved exercise technique, movement at target joints, use of target muscles after min to mod verbal, visual, tactile cues.    PATIENT EDUCATION: Education details: Exercise technique, progress towards remainder goals Person educated: Patient Education method: Explanation, Demonstration, Tactile cues, and Verbal cues Education comprehension: verbalized understanding, returned demonstration, verbal cues required, tactile cues required, and needs further education   HOME EXERCISE PROGRAM: No updates   PT Short Term Goals -      PT SHORT TERM GOAL #1   Title Pt will be independent with HEP in order to improve strength and balance in order to decrease fall risk and improve function at home and work.    Baseline 02/02/2021: Patient reports limited HEP in place currently from [facility] agency 12/14 compliance; 1/20/2023Patient states no questions regarding his current exercise regimen and states he is compliant.    Time 6    Period Weeks    Status Achieved    Target Date 03/16/21              PT Long Term Goals -       PT LONG TERM GOAL #1   Title Pt will improve FOTO to target score of 50 to  display perceived improvements in ability to complete ADL's.    Baseline 02/02/2021= 43 12/143: 51%; 04/29/2021= 53%; 09/09/2021= 53; 10/12/2021=54%   Time 12    Period Weeks    Status Achieved      PT LONG TERM GOAL #2   Title Pt will decrease 5TSTS by at least 8 seconds in order to demonstrate clinically significant improvement in LE strength.    Baseline 02/02/2021= 28.22 sec with BUE Support 12/14: 23.1 seconds; 04/29/2021=Unable to test today due to patient became nauseous with 6 min walk test and attempted TUG- unable to continue- will reassess next 1-2 visits; 06/08/2021= 25.1 sec with min BUE support; 07/20/2021= 17.75 with min BUE support    Time 12    Period Weeks    Status Achieved  Target Date 07/22/21      PT LONG TERM GOAL #3   Title Pt will decrease TUG to below 19 seconds/decrease in order to demonstrate decreased fall risk.    Baseline 02/02/2021= 27.30 sec without AD 12/14: 23 seconds; 04/29/2021- Unable to assess as patient became very nauseated upon standing and request to stop treatment. 06/08/2021= 19.10 sec without an AD; 07/20/2021= 15.68 sec without UE Support    Time 12    Period Weeks    Status Achieved    Target Date 07/22/21      PT LONG TERM GOAL #4   Title Pt will increase 6MWT by at least 23m (157ft) in order to demonstrate clinically significant improvement in cardiopulmonary endurance and community ambulation    Baseline 02/02/2021= 83 feet in 1 min 10 sec wihtout an AD 12/14: 200 ft in  23minutes; 04/29/2021=Patient ambulated approx 60 feet yet had to stop due onset of nausea and unable to complete today; 07/15/2021= 330 feet in 2 min 25 sec without AD.  09/09/2021= 350 feet in 3 min 45 sec. 10/12/2021= 710 feet in 6 min- no AD (Short stand rest break)    Time 12    Period Weeks    Status Achieved   Target Date 07/22/21      PT LONG TERM GOAL #5   Title Pt will increase 10MWT by at least 0.2 m/s in order to demonstrate clinically significant improvement in community  ambulation.    Baseline 02/02/2021= 0.42 m/s 12/14: 0.49 m/s; 04/29/2021=Patient unable to test today secondary to being nauseated. 06/08/2021= 0.62 m/s; 07/20/2021= 0.87 m/s    Time 12    Period Weeks    Status Achieved    Target Date 07/22/21      PT LONG TERM GOAL #6   Title Pt will decrease 5TSTS by at least 3 seconds in order to demonstrate clinically significant improvement in LE strength.    Baseline 07/20/2021= 17.75 sec with Minimal BUE Support; 09/09/2021= 17.50 sec with very minimal UE Support. 10/12/2021= 14.90 sec with min BUE Support. 12/05/2021= 14.30 sec with BUE; 01/09/2022= 23.11 sec with min UE on thighs vs. Arm rests for more emphasis on BLE.  03/15/2022= 17.46 sec without UE Support. 04/11/2022= 15.22 sec without UE support   Time 12    Period Weeks    Status Ongoing   Target Date 07/05/2022   PT LONG TERM GOAL #7 Title:  Pt will increase 6MWT by at least 75m (145ft) in order to demonstrate clinically significant improvement in cardiopulmonary endurance and community ambulation                                                       Baseline: 10/12/2021= 710 feet; 12/05/2021= 827 feet; 01/09/2022= 620 feet without an AD - stopping at 4 min mark due to self reported fatigue. 03/15/2022= 1040 feet; 04/13/2022= Patient too fatigued to attempt after other testing. Will assess next visit. 06/07/2022= 750 feet in 4 min; 06/26/2022= 1350 feet  Goal status: MET Target date: 07/05/2022        PT LONG TERM GOAL #8  Title Patient will demonstrate an improved Berg Balance Score of > 50/56 as to demonstrate improved balance with ADLs such as sitting/standing and transfer balance and reduced fall risk.  Baseline 01/09/2022= 47/56; 04/12/2022= 53/56  Time 12  Period Weeks   Status Met  Target Date 04/03/2022   PT LONG TERM GOAL #9  Title Pt will increase BUE shoulder/elbow strength of  by at least 1/2 MMT grade in order to demonstrate improvement in strength and function    Baseline 04/13/2022= BUE = 4/5  throughtout  Time 12   Period Weeks   Status NEW  Target Date 07/05/2022        Plan -      Clinical Impression Statement Pt presented with good motivation and stated he needed to be ready for a 6 min test on Friday- stating he was supposed to be able to walk 1000 feet. He practiced today - and actually demo tremendous improvement and completed the test with much improved (almost double) the distance. Overall- able to progress through UE and LE exercises and not as limited by fatigued today.  Patient will continue to benefit from skilled PT services for improved functional strength, endurance, and mobility until goals have been met. Will continue to modify session length/intensity as needed due to fluctuating comorbidities.    Personal Factors and Comorbidities Comorbidity 3+    Comorbidities DM, Liver transplant, ESRD, CAD, feeding tube    Examination-Activity Limitations Carry;Lift;Reach Overhead;Squat;Stairs;Stand;Transfers;Toileting    Examination-Participation Restrictions Cleaning;Community Activity;Driving;Medication Management;Meal Prep;Yard Work    [employer]chant [employer] officer Evolving/Moderate complexity    Rehab Potential Fair    PT Frequency 2x / week    PT Duration 12 weeks    PT Treatment/Interventions ADLs/Self Care Home Management;Cryotherapy;Moist Heat;DME Instruction;Gait training;Stair training;Functional mobility training;Therapeutic activities;Therapeutic exercise;Balance training;Neuromuscular re-education;Patient/family education;Wheelchair mobility training;Manual techniques;Passive range of motion;Dry needling;Energy conservation;Joint Manipulations    PT Next Visit Plan Progressive Therapeutic exercises, Balance training, transfer/gait training.    PT Home Exercise Plan No changes or updates today.    Consulted and Agree with Plan of Care Patient           Ollen Bowl, PT Physical Therapist- [facility]  06/26/2022,  3:23 PM

## 2022-06-28 ENCOUNTER — Ambulatory Visit: Payer: Medicare Other

## 2022-07-05 ENCOUNTER — Ambulatory Visit: Payer: Medicare Other

## 2022-07-08 ENCOUNTER — Other Ambulatory Visit: Payer: Self-pay

## 2022-07-08 ENCOUNTER — Encounter: Payer: Self-pay | Admitting: Emergency Medicine

## 2022-07-08 ENCOUNTER — Emergency Department: Payer: Medicare Other

## 2022-07-08 ENCOUNTER — Emergency Department
Admission: EM | Admit: 2022-07-08 | Discharge: 2022-07-08 | Disposition: A | Discharge status: Home / Self Care | Payer: Medicare Other | Attending: Emergency Medicine | Admitting: Emergency Medicine

## 2022-07-08 DIAGNOSIS — I2510 Atherosclerotic heart disease of native coronary artery without angina pectoris: Secondary | ICD-10-CM | POA: Insufficient documentation

## 2022-07-08 DIAGNOSIS — R0602 Shortness of breath: Secondary | ICD-10-CM | POA: Insufficient documentation

## 2022-07-08 DIAGNOSIS — Z992 Dependence on renal dialysis: Secondary | ICD-10-CM | POA: Diagnosis not present

## 2022-07-08 DIAGNOSIS — R079 Chest pain, unspecified: Secondary | ICD-10-CM

## 2022-07-08 DIAGNOSIS — Z7982 Long term (current) use of aspirin: Secondary | ICD-10-CM | POA: Insufficient documentation

## 2022-07-08 DIAGNOSIS — N186 End stage renal disease: Secondary | ICD-10-CM | POA: Insufficient documentation

## 2022-07-08 LAB — BASIC METABOLIC PANEL
Anion gap: 13 (ref 5–15)
BUN: 49 mg/dL — ABNORMAL HIGH (ref 8–23)
CO2: 26 mmol/L (ref 22–32)
Calcium: 9 mg/dL (ref 8.9–10.3)
Chloride: 95 mmol/L — ABNORMAL LOW (ref 98–111)
Creatinine, Ser: 3.67 mg/dL — ABNORMAL HIGH (ref 0.61–1.24)
GFR, Estimated: 17 mL/min — ABNORMAL LOW (ref >60)
Glucose, Bld: 214 mg/dL — ABNORMAL HIGH (ref 70–99)
Potassium: 3.7 mmol/L (ref 3.5–5.1)
Sodium: 134 mmol/L — ABNORMAL LOW (ref 135–145)

## 2022-07-08 LAB — CBC
HCT: 31 % — ABNORMAL LOW (ref 39.0–52.0)
Hemoglobin: 10.6 g/dL — ABNORMAL LOW (ref 13.0–17.0)
MCH: 30.6 pg (ref 26.0–34.0)
MCHC: 34.2 g/dL (ref 30.0–36.0)
MCV: 89.6 fL (ref 80.0–100.0)
Platelets: 201 10*3/uL (ref 150–400)
RBC: 3.46 MIL/uL — ABNORMAL LOW (ref 4.22–5.81)
RDW: 13.6 % (ref 11.5–15.5)
WBC: 7.1 10*3/uL (ref 4.0–10.5)
nRBC: 0 % (ref 0.0–0.2)

## 2022-07-08 LAB — PROTIME-INR
INR: 1.1 (ref 0.8–1.2)
Prothrombin Time: 14.1 seconds (ref 11.4–15.2)

## 2022-07-08 LAB — TROPONIN I (HIGH SENSITIVITY)
Troponin I (High Sensitivity): 15 ng/L (ref <18)
Troponin I (High Sensitivity): 17 ng/L (ref <18)

## 2022-07-08 NOTE — ED Triage Notes (Signed)
Patient to ED via [facility] from dialysis for right sided chest pain that started around 1145. Denies radiation but states some nausea. Hx of MI and liver transplant. Given 325 aspirin by EMS. Did not receive full dialysis treatment.   20 L Hand, VS Stable with EMS.

## 2022-07-08 NOTE — Discharge Instructions (Signed)
Return to the ER for new, worsening, or persistent severe chest pain, difficulty breathing, weakness or lightheadedness, or any other new or worsening symptoms that concern you.  Follow-up with your primary care doctor and cardiologist.

## 2022-07-08 NOTE — ED Provider Notes (Signed)
-----------------------------------------   4:38 PM on 07/08/2022 ----------------------------------------- Patient care assumed from Dr. Cherylann Banas.  Patient's repeat troponin remains negative and unchanged from prior troponin.  Given the patient's reassuring workup I believe the patient is safe for discharge home.  Patient to resume dialysis on Tuesday, reassuring electrolytes including potassium of 3.7.  Patient is agreeable to plan.   Harvest Dark, MD 07/08/22 [phone]

## 2022-07-08 NOTE — ED Provider Notes (Signed)
[facility] Provider Note    Event Date/Time   First MD Initiated Contact with Patient 07/08/22 1239     (approximate)   History   Chest Pain   HPI  Alex Hampton is a 67 y.o. male with a history of NASH cirrhosis status post liver transplant, ESRD on dialysis, G-tube, pancreatic pseudocyst, CAD status post stent angioplasty who presents with pain, acute onset about 10 minutes into dialysis, described as pressure, mainly in the right side of his chest.  He states he feels mild shortness of breath and felt slightly nauseous with the pain but does not feel lightheaded.  He received aspirin from EMS.  The patient states that the pain is now mostly resolved.  He states that his most recent dialysis session 2 days ago was normal.  The patient denies any leg swelling.  He has no fever or chills and denies any cough.  Viewed the past medical records.  The patient's most recent admission was in May 2023 due to chest pain.  Workup at that time showed pneumonia and subsegmental PE.  The patient was started on Eliquis.   Physical Exam   Triage Vital Signs: ED Triage Vitals  Enc Vitals Group     BP 07/08/22 1238 (!) 149/84     Pulse Rate 07/08/22 1238 75     Resp 07/08/22 1238 18     Temp 07/08/22 1238 98.3 F (36.8 C)     Temp Source 07/08/22 1238 Oral     SpO2 07/08/22 1238 97 %     Weight --      Height --      Head Circumference --      Peak Flow --      Pain Score 07/08/22 1234 3     Pain Loc --      Pain Edu? --      Excl. in [place]? --     Most recent vital signs: Vitals:   07/08/22 1406 07/08/22 1430  BP: (!) 141/79 (!) 151/80  Pulse: 64 70  Resp: 20 18  Temp:    SpO2: 100% 100%     General: Awake, no distress.  CV:  Good peripheral perfusion.  Normal heart sounds. Resp:  Normal effort.  Lungs CTAB. Abd:  No distention.   Other:  No peripheral edema.  No leg tenderness.   ED Results / Procedures / Treatments   Labs (all labs ordered are  listed, but only abnormal results are displayed) Labs Reviewed  BASIC METABOLIC PANEL - Abnormal; Notable for the following components:      Result Value   Sodium 134 (*)    Chloride 95 (*)    Glucose, Bld 214 (*)    BUN 49 (*)    Creatinine, Ser 3.67 (*)    GFR, Estimated 17 (*)    All other components within normal limits  CBC - Abnormal; Notable for the following components:   RBC 3.46 (*)    Hemoglobin 10.6 (*)    HCT 31.0 (*)    All other components within normal limits  PROTIME-INR  TROPONIN I (HIGH SENSITIVITY)  TROPONIN I (HIGH SENSITIVITY)     EKG  ED ECG REPORT I, Arta Silence, the attending physician, personally viewed and interpreted this ECG.  Date: 07/08/2022 EKG Time: 1236 Rate: 75 Rhythm: normal sinus rhythm QRS Axis: normal Intervals: normal ST/T Wave abnormalities: Nonspecific ST abnormalities Narrative Interpretation: no evidence of acute ischemia; no significant change when compared  EKG of 08/08/2021    RADIOLOGY  Chest x-ray: I independently viewed and interpreted the images; there is no focal consolidation or edema  PROCEDURES:  Critical Care performed: No  Procedures   MEDICATIONS ORDERED IN ED: Medications - No data to display   IMPRESSION / MDM / [place] / ED COURSE  I reviewed the triage vital signs and the nursing notes.  67 year old male with PMH as noted above including CAD status post stents, ESRD, and a segmental PE last year no longer on anticoagulation presents with atypical chest pain after starting dialysis today.  Vital signs and physical exam are unremarkable.  EKG is nonischemic.  Differential diagnosis includes, but is not limited to, ACS, musculoskeletal pain, GERD.  I have a low suspicion for PE given the lack of tachycardia or hypoxia.  The patient states that the pain is not like when he had a PE last year.  There is also no evidence of aortic dissection or other vascular cause.  We will obtain labs  including cardiac enzymes and reassess.  Patient's presentation is most consistent with acute presentation with potential threat to life or bodily function.  The patient is on the cardiac monitor to evaluate for evidence of arrhythmia and/or significant heart rate changes.  ----------------------------------------- 3:10 PM on 07/08/2022 -----------------------------------------  Initial troponin is negative.  Lab workup is otherwise unremarkable.  Potassium is 3.7.  Creatinine is consistent with baseline.  On reassessment the patient states that the pain is completely resolved.  There is no evidence of PE or other acute process.  Will obtain a repeat troponin given the patient's history of CAD and elevated ACS risk.  However, if this is negative and the patient remains pain-free, he is appropriate for discharge home.  The patient feels well and is comfortable with this plan.  I have signed his care out to the oncoming ED physician Dr. Kerman Passey.   FINAL CLINICAL IMPRESSION(S) / ED DIAGNOSES   Final diagnoses:  Chest pain, unspecified type     Rx / DC Orders   ED Discharge Orders     None        Note:  This document was prepared using Dragon voice recognition software and may include unintentional dictation errors.    Arta Silence, MD 07/08/22 1511

## 2022-07-31 ENCOUNTER — Ambulatory Visit: Payer: BLUE CROSS/BLUE SHIELD

## 2022-08-02 ENCOUNTER — Ambulatory Visit: Payer: BLUE CROSS/BLUE SHIELD

## 2022-08-07 ENCOUNTER — Ambulatory Visit: Payer: BLUE CROSS/BLUE SHIELD

## 2022-08-18 ENCOUNTER — Ambulatory Visit: Payer: Medicare Other | Attending: Gastroenterology

## 2022-08-18 DIAGNOSIS — M6281 Muscle weakness (generalized): Secondary | ICD-10-CM | POA: Insufficient documentation

## 2022-08-18 DIAGNOSIS — R269 Unspecified abnormalities of gait and mobility: Secondary | ICD-10-CM | POA: Diagnosis present

## 2022-08-18 DIAGNOSIS — R2681 Unsteadiness on feet: Secondary | ICD-10-CM | POA: Diagnosis present

## 2022-08-18 DIAGNOSIS — R2689 Other abnormalities of gait and mobility: Secondary | ICD-10-CM | POA: Diagnosis present

## 2022-08-18 DIAGNOSIS — R278 Other lack of coordination: Secondary | ICD-10-CM | POA: Diagnosis present

## 2022-08-18 DIAGNOSIS — R296 Repeated falls: Secondary | ICD-10-CM | POA: Diagnosis present

## 2022-08-18 DIAGNOSIS — R262 Difficulty in walking, not elsewhere classified: Secondary | ICD-10-CM | POA: Insufficient documentation

## 2022-08-18 NOTE — Therapy (Signed)
OUTPATIENT PHYSICAL THERAPY TREATMENT NOTE/RECERTIFICATION/Physical Therapy Progress Note   Dates of reporting period  05/15/2022   to   08/18/2022   Patient Name: Alex Hampton MRN: [id number] DOB:Sep 02, 1955, 67 y.o., male Today's Date: 08/18/2022  PCP: Dr. Dorothey Baseman REFERRING PROVIDER: Azucena Cecil, MD   PT End of Session - 08/18/22 0850     Visit Number 100    Number of Visits 112    Date for PT Re-Evaluation 11/10/22    Authorization Type Medicare/BCBS; PN 03/23/21    Authorization Time Period 04/12/22-07/05/22    Progress Note Due on Visit 110    PT Start Time 0847    PT Stop Time 0912    PT Time Calculation (min) 25 min    Equipment Utilized During Treatment Gait belt    Activity Tolerance Patient tolerated treatment well    Behavior During Therapy WFL for tasks assessed/performed                  Past Medical History:  Diagnosis Date   Depression    History of cardiac cath    History of heart artery stent    Hyperlipidemia    Hypertension    MI (myocardial infarction) (HCC)    Renal disorder    Past Surgical History:  Procedure Laterality Date   IR GJ TUBE CHANGE  11/22/2020   IR GJ TUBE CHANGE  11/26/2020   IR REPLC GASTRO/COLONIC TUBE PERCUT W/FLUORO  10/14/2020   LEFT HEART CATH AND CORONARY ANGIOGRAPHY N/A 08/19/2020   Procedure: LEFT HEART CATH AND CORONARY ANGIOGRAPHY;  Surgeon: Lamar Blinks, MD;  Location: [facility] INVASIVE CV LAB;  Service: Cardiovascular;  Laterality: N/A;   LIVER TRANSPLANT     Patient Active Problem List   Diagnosis Date Noted   Pulmonary embolism (HCC) 08/08/2021   CAP (community acquired pneumonia) 08/08/2021   Protein calorie malnutrition (HCC) 08/08/2021   Hypokalemia 08/08/2021   Pancreatic cyst    Verbal auditory hallucination    Early satiety    Feeding tube dysfunction    Generalized weakness 11/26/2020   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HCC) 11/26/2020   Recurrent falls 11/26/2020   Syncope  10/15/2020   Uremia 10/10/2020   ESRD on hemodialysis (HCC) 10/10/2020   Hyperkalemia 10/10/2020   Elevated troponin 10/10/2020   GERD (gastroesophageal reflux disease) 10/10/2020   CAD (coronary artery disease) 10/10/2020   G tube feedings (HCC) 10/10/2020   Diarrhea 10/10/2020   Diabetes mellitus (HCC) 10/10/2020   Weakness    ACS (acute coronary syndrome) (HCC)    Liver transplant recipient [facility])    Anemia of chronic disease    Type 2 diabetes mellitus with diabetic neuropathy, with long-term current use of insulin (HCC)    ESRD (end stage renal disease) (HCC) 08/19/2020   History of anemia due to chronic kidney disease 08/19/2020   Gait abnormality 10/17/2019   History of fall within past 90 days 10/17/2019   Visual hallucination 10/17/2019   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 10/17/2019   HTN (hypertension) 10/17/2019   History of MI (myocardial infarction) 10/17/2019   OSA (obstructive sleep apnea) 10/17/2019   Nondisplaced comminuted supracondylar fracture without intercondylar fracture of left humerus, subsequent encounter for fracture with nonunion 10/17/2019   Anxiety and depression 10/17/2019    REFERRING DIAG: Other malaise Z94.4 (ICD-10-CM) - Liver transplant status R26.9 (ICD-10-CM) - Unspecified abnormalities of gait and mobility   THERAPY DIAG:  Difficulty in walking, not elsewhere classified  Muscle weakness (  generalized)  Other abnormalities of gait and mobility  Abnormality of gait and mobility  Other lack of coordination  Unsteadiness on feet  Repeated falls  Rationale for Evaluation and Treatment Rehabilitation  PERTINENT HISTORY: Patient is a 67 year old male with recent referral for abnomality of gait and recent liver transplant in November 2021. He has past medical history significant for Liver transplant (02/18/2020), CKD, End stage renal disease- On hemodialyis T/TH/Sat., Anemia, DM with neuropathy, Non-stemi, Coronary artery disease,  Feeding tube with poor appetite, PE now on eliquis   PRECAUTIONS: none  SUBJECTIVE:  Patient reports doing well - had some consults about poss kidney transplant and waiting to hear back. States still interested in learning gym based exercises and going to go look at his gym in his apartment complex. Reports biggest limitation at this point is limited endurance.  PAIN:  Are you having pain? No   INTERVENTION  for 08/18/2022    Physical therapy treatment session today consisted of completing assessment of goals and administration of testing as demonstrated and documented in flow sheet, treatment, and goals section of this note. Addition treatments may be found below.    Strength R/L 4/5  within available ROM-Shoulder flexion (anterior deltoid/pec major/coracobrachialis, axillary n. (C5-6) and musculocutaneous n. (C5-7))- Limited to around 100 deg 4/5 within available ROM- Shoulder abduction (deltoid/supraspinatus, axillary/suprascapular n, C5) 5/5 Elbow flexion (biceps brachii, brachialis, brachioradialis, musculoskeletal n, C5-6) 5/5 Elbow extension (triceps, radial n, C7) 5/5 Wrist Extension 5/5 Wrist Flexion Good grip strength bilaterally   Pt educated throughout session about proper posture and technique with exercises. Improved exercise technique, movement at target joints, use of target muscles after min to mod verbal, visual, tactile cues.    PATIENT EDUCATION: Education details: Exercise technique, progress towards remainder goals Person educated: Patient Education method: Explanation, Demonstration, Tactile cues, and Verbal cues Education comprehension: verbalized understanding, returned demonstration, verbal cues required, tactile cues required, and needs further education   HOME EXERCISE PROGRAM: No updates   PT Short Term Goals -      PT SHORT TERM GOAL #1   Title Pt will be independent with HEP in order to improve strength and balance in order to decrease fall  risk and improve function at home and work.    Baseline 02/02/2021: Patient reports limited HEP in place currently from [facility] agency 12/14 compliance; 1/20/2023Patient states no questions regarding his current exercise regimen and states he is compliant.    Time 6    Period Weeks    Status Achieved    Target Date 03/16/21              PT Long Term Goals -       PT LONG TERM GOAL #1   Title Pt will improve FOTO to target score of 50 to display perceived improvements in ability to complete ADL's.    Baseline 02/02/2021= 43 12/143: 51%; 04/29/2021= 53%; 09/09/2021= 53; 10/12/2021=54%   Time 12    Period Weeks    Status Achieved      PT LONG TERM GOAL #2   Title Pt will decrease 5TSTS by at least 8 seconds in order to demonstrate clinically significant improvement in LE strength.    Baseline 02/02/2021= 28.22 sec with BUE Support 12/14: 23.1 seconds; 04/29/2021=Unable to test today due to patient became nauseous with 6 min walk test and attempted TUG- unable to continue- will reassess next 1-2 visits; 06/08/2021= 25.1 sec with min BUE support; 07/20/2021= 17.75 with min BUE support  Time 12    Period Weeks    Status Achieved    Target Date 07/22/21      PT LONG TERM GOAL #3   Title Pt will decrease TUG to below 19 seconds/decrease in order to demonstrate decreased fall risk.    Baseline 02/02/2021= 27.30 sec without AD 12/14: 23 seconds; 04/29/2021- Unable to assess as patient became very nauseated upon standing and request to stop treatment. 06/08/2021= 19.10 sec without an AD; 07/20/2021= 15.68 sec without UE Support    Time 12    Period Weeks    Status Achieved    Target Date 07/22/21      PT LONG TERM GOAL #4   Title Pt will increase by at least 54m (133ft) in order to demonstrate clinically significant improvement in cardiopulmonary endurance and community ambulation    Baseline 02/02/2021= 83 feet in 1 min 10 sec wihtout an AD 12/14: 200 ft in  ; 04/29/2021=Patient ambulated  approx 60 feet yet had to stop due onset of nausea and unable to complete today; 07/15/2021= 330 feet in 2 min 25 sec without AD.  09/09/2021= 350 feet in 3 min 45 sec. 10/12/2021= 710 feet in 6 min- no AD (Short stand rest break)    Time 12    Period Weeks    Status Achieved   Target Date 07/22/21      PT LONG TERM GOAL #5   Title Pt will increase by at least 0.2 m/s in order to demonstrate clinically significant improvement in community ambulation.    Baseline 02/02/2021= 0.42 m/s 12/14: 0.49 m/s; 04/29/2021=Patient unable to test today secondary to being nauseated. 06/08/2021= 0.62 m/s; 07/20/2021= 0.87 m/s    Time 12    Period Weeks    Status Achieved    Target Date 07/22/21      PT LONG TERM GOAL #6   Title Pt will decrease 5TSTS by at least 3 seconds in order to demonstrate clinically significant improvement in LE strength.    Baseline 07/20/2021= 17.75 sec with Minimal BUE Support; 09/09/2021= 17.50 sec with very minimal UE Support. 10/12/2021= 14.90 sec with min BUE Support. 12/05/2021= 14.30 sec with BUE; 01/09/2022= 23.11 sec with min UE on thighs vs. Arm rests for more emphasis on BLE.  03/15/2022= 17.46 sec without UE Support. 04/11/2022= 15.22 sec without UE support; 08/18/2022= 13.69 sec without UE Support   Time 12    Period Weeks    Status MET   Target Date 07/05/2022   PT LONG TERM GOAL #7 Title:  Pt will increase by at least 40m (167ft) in order to demonstrate clinically significant improvement in cardiopulmonary endurance and community ambulation                                                       Baseline: 10/12/2021= 710 feet; 12/05/2021= 827 feet; 01/09/2022= 620 feet without an AD - stopping at 4 min mark due to self reported fatigue. 03/15/2022= 1040 feet; 04/13/2022= Patient too fatigued to attempt after other testing. Will assess next visit. 06/07/2022= 750 feet in 4 min; 06/26/2022= 1350 feet  Goal status: MET Target date: 07/05/2022        PT LONG TERM GOAL #8  Title Patient  will demonstrate an improved Berg Balance Score of > 50/56 as to demonstrate improved balance  with ADLs such as sitting/standing and transfer balance and reduced fall risk.  Baseline 01/09/2022= 47/56; 04/12/2022= 53/56  Time 12   Period Weeks   Status Met  Target Date 04/03/2022   PT LONG TERM GOAL #9  Title Pt will increase BUE shoulder/elbow strength of  by at least 1/2 MMT grade in order to demonstrate improvement in strength and function    Baseline 04/13/2022= BUE = 4/5 throughtout ; 08/18/18- mostly 5/5 from elbow down and limited ROM with shoulder but 4/5 in available range  Time 12   Period Weeks   Status Partially met  Target Date 07/05/2022   PT LONG TERM GOAL #10  Title Pt will transition from skilled PT interventions to gym based exercise program to demonstrate independence and continuation of performing exercises.    Baseline 08/18/2022- Patient has not attempted gym exercises  Time 12   Period Weeks   Status NEW  Target Date 11/10/2022       Plan -      Clinical Impression Statement Patient returns after 1+ month absence. He states interested in learning gym based exercises and will recertify for more visit to educate him in learning proper form with gym based program to continue his strength gains. Patient's condition has the potential to improve in response to therapy. Maximum improvement is yet to be obtained. The anticipated improvement is attainable and reasonable in a generally predictable time.Patient will continue to benefit from skilled PT services for improved functional strength, endurance, and mobility until goals have been met. Will continue to modify session length/intensity as needed due to fluctuating comorbidities.    Personal Factors and Comorbidities Comorbidity 3+    Comorbidities DM, Liver transplant, ESRD, CAD, feeding tube    Examination-Activity Limitations Carry;Lift;Reach Overhead;Squat;Stairs;Stand;Transfers;Toileting    Examination-Participation  Restrictions Cleaning;Community Activity;Driving;Medication Management;Meal Prep;Yard Work    Conservation officer, historic buildings Evolving/Moderate complexity    Rehab Potential Fair    PT Frequency 1x / week    PT Duration 12 weeks    PT Treatment/Interventions ADLs/Self Care Home Management;Cryotherapy;Moist Heat;DME Instruction;Gait training;Stair training;Functional mobility training;Therapeutic activities;Therapeutic exercise;Balance training;Neuromuscular re-education;Patient/family education;Wheelchair mobility training;Manual techniques;Passive range of motion;Dry needling;Energy conservation;Joint Manipulations    PT Next Visit Plan Progressive Therapeutic exercises, Balance training, transfer/gait training.    PT Home Exercise Plan No changes or updates today.    Consulted and Agree with Plan of Care Patient           Louis Meckel, PT Physical Therapist- [facility]  [facility]  08/18/2022, 11:44 AM

## 2022-08-23 ENCOUNTER — Ambulatory Visit: Payer: BLUE CROSS/BLUE SHIELD

## 2022-08-25 ENCOUNTER — Ambulatory Visit: Payer: Medicare Other

## 2022-08-28 ENCOUNTER — Ambulatory Visit: Payer: Medicare Other

## 2022-08-29 ENCOUNTER — Ambulatory Visit: Payer: BLUE CROSS/BLUE SHIELD

## 2022-08-30 ENCOUNTER — Ambulatory Visit: Payer: Medicare Other

## 2022-08-30 DIAGNOSIS — M6281 Muscle weakness (generalized): Secondary | ICD-10-CM

## 2022-08-30 DIAGNOSIS — R278 Other lack of coordination: Secondary | ICD-10-CM

## 2022-08-30 DIAGNOSIS — R2681 Unsteadiness on feet: Secondary | ICD-10-CM

## 2022-08-30 DIAGNOSIS — R262 Difficulty in walking, not elsewhere classified: Secondary | ICD-10-CM | POA: Diagnosis not present

## 2022-08-30 DIAGNOSIS — R269 Unspecified abnormalities of gait and mobility: Secondary | ICD-10-CM

## 2022-08-30 DIAGNOSIS — R2689 Other abnormalities of gait and mobility: Secondary | ICD-10-CM

## 2022-08-30 NOTE — Therapy (Signed)
OUTPATIENT PHYSICAL THERAPY TREATMENT NOTE   Patient Name: Alex Hampton MRN: [id number] DOB:[birth year], 67 y.o., male Today's Date: 08/30/2022  PCP: Dr. Dorothey Baseman REFERRING PROVIDER: Azucena Cecil, MD   PT End of Session - 08/30/22 0803     Visit Number 101    Number of Visits 112    Date for PT Re-Evaluation 11/10/22    Authorization Type Medicare/BCBS; PN 03/23/21    Authorization Time Period 04/12/22-07/05/22    Progress Note Due on Visit 110    PT Start Time 0801    PT Stop Time 0835    PT Time Calculation (min) 34 min    Equipment Utilized During Treatment Gait belt    Activity Tolerance Patient tolerated treatment well    Behavior During Therapy WFL for tasks assessed/performed                  Past Medical History:  Diagnosis Date   Depression    History of cardiac cath    History of heart artery stent    Hyperlipidemia    Hypertension    MI (myocardial infarction) (HCC)    Renal disorder    Past Surgical History:  Procedure Laterality Date   IR GJ TUBE CHANGE  11/22/2020   IR GJ TUBE CHANGE  11/26/2020   IR REPLC GASTRO/COLONIC TUBE PERCUT W/FLUORO  10/14/2020   LEFT HEART CATH AND CORONARY ANGIOGRAPHY N/A 08/19/2020   Procedure: LEFT HEART CATH AND CORONARY ANGIOGRAPHY;  Surgeon: Lamar Blinks, MD;  Location: [facility] INVASIVE CV LAB;  Service: Cardiovascular;  Laterality: N/A;   LIVER TRANSPLANT     Patient Active Problem List   Diagnosis Date Noted   Pulmonary embolism (HCC) 08/08/2021   CAP (community acquired pneumonia) 08/08/2021   Protein calorie malnutrition (HCC) 08/08/2021   Hypokalemia 08/08/2021   Pancreatic cyst    Verbal auditory hallucination    Early satiety    Feeding tube dysfunction    Generalized weakness 11/26/2020   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HCC) 11/26/2020   Recurrent falls 11/26/2020   Syncope 10/15/2020   Uremia 10/10/2020   ESRD on hemodialysis (HCC) 10/10/2020   Hyperkalemia 10/10/2020    Elevated troponin 10/10/2020   GERD (gastroesophageal reflux disease) 10/10/2020   CAD (coronary artery disease) 10/10/2020   G tube feedings (HCC) 10/10/2020   Diarrhea 10/10/2020   Diabetes mellitus (HCC) 10/10/2020   Weakness    ACS (acute coronary syndrome) (HCC)    Liver transplant recipient [facility])    Anemia of chronic disease    Type 2 diabetes mellitus with diabetic neuropathy, with long-term current use of insulin (HCC)    ESRD (end stage renal disease) (HCC) 08/19/2020   History of anemia due to chronic kidney disease 08/19/2020   Gait abnormality 10/17/2019   History of fall within past 90 days 10/17/2019   Visual hallucination 10/17/2019   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 10/17/2019   HTN (hypertension) 10/17/2019   History of MI (myocardial infarction) 10/17/2019   OSA (obstructive sleep apnea) 10/17/2019   Nondisplaced comminuted supracondylar fracture without intercondylar fracture of left humerus, subsequent encounter for fracture with nonunion 10/17/2019   Anxiety and depression 10/17/2019    REFERRING DIAG: Other malaise Z94.4 (ICD-10-CM) - Liver transplant status R26.9 (ICD-10-CM) - Unspecified abnormalities of gait and mobility   THERAPY DIAG:  Difficulty in walking, not elsewhere classified  Muscle weakness (generalized)  Other abnormalities of gait and mobility  Abnormality of gait and mobility  Other lack  of coordination  Unsteadiness on feet  Rationale for Evaluation and Treatment Rehabilitation  PERTINENT HISTORY: Patient is a 67 year old male with recent referral for abnomality of gait and recent liver transplant in November 2021. He has past medical history significant for Liver transplant (02/18/2020), CKD, End stage renal disease- On hemodialyis T/TH/Sat., Anemia, DM with neuropathy, Non-stemi, Coronary artery disease, Feeding tube with poor appetite, PE now on eliquis   PRECAUTIONS: none  SUBJECTIVE:  Patient reports doing okay.  States did not have a chance to go to his apartment based gym yet to check out the equipment but plans to go this week or before his next appointment.   PAIN:  Are you having pain? No   INTERVENTION  for 08/30/2022    Instructed in Well Zone LE strengthening exercises:   Knee ext: Seat (6) LE at (2)- resistance L2 x 12 reps  x 3 sets Knee flex: seat (6) LE  at (13) - resistance L2  x 12 reps x 3 sets Leg press: Seat (9) -  resistance L3- 12 reps x 3 sets  Calf press: Seat (9)- Resistance LE- 12 reps x 3 sets  Deadlifts with 5# Kettlebell x 12 reps Hip abd- resistive side step with orange band x 12 reps  Pt educated throughout session about proper posture and technique with exercises. Improved exercise technique, movement at target joints, use of target muscles after min to mod verbal, visual, tactile cues.    PATIENT EDUCATION: Education details: Exercise technique, progress towards remainder goals Person educated: Patient Education method: Explanation, Demonstration, Tactile cues, and Verbal cues Education comprehension: verbalized understanding, returned demonstration, verbal cues required, tactile cues required, and needs further education   HOME EXERCISE PROGRAM: No updates   PT Short Term Goals -      PT SHORT TERM GOAL #1   Title Pt will be independent with HEP in order to improve strength and balance in order to decrease fall risk and improve function at home and work.    Baseline 02/02/2021: Patient reports limited HEP in place currently from [facility] agency 12/14 compliance; 1/20/2023Patient states no questions regarding his current exercise regimen and states he is compliant.    Time 6    Period Weeks    Status Achieved    Target Date 03/16/21              PT Long Term Goals -       PT LONG TERM GOAL #1   Title Pt will improve FOTO to target score of 50 to display perceived improvements in ability to complete ADL's.    Baseline 02/02/2021= 43 12/143: 51%;  04/29/2021= 53%; 09/09/2021= 53; 10/12/2021=54%   Time 12    Period Weeks    Status Achieved      PT LONG TERM GOAL #2   Title Pt will decrease 5TSTS by at least 8 seconds in order to demonstrate clinically significant improvement in LE strength.    Baseline 02/02/2021= 28.22 sec with BUE Support 12/14: 23.1 seconds; 04/29/2021=Unable to test today due to patient became nauseous with 6 min walk test and attempted TUG- unable to continue- will reassess next 1-2 visits; 06/08/2021= 25.1 sec with min BUE support; 07/20/2021= 17.75 with min BUE support    Time 12    Period Weeks    Status Achieved    Target Date 07/22/21      PT LONG TERM GOAL #3   Title Pt will decrease TUG to below 19 seconds/decrease in  order to demonstrate decreased fall risk.    Baseline 02/02/2021= 27.30 sec without AD 12/14: 23 seconds; 04/29/2021- Unable to assess as patient became very nauseated upon standing and request to stop treatment. 06/08/2021= 19.10 sec without an AD; 07/20/2021= 15.68 sec without UE Support    Time 12    Period Weeks    Status Achieved    Target Date 07/22/21      PT LONG TERM GOAL #4   Title Pt will increase by at least 76m (147ft) in order to demonstrate clinically significant improvement in cardiopulmonary endurance and community ambulation    Baseline 02/02/2021= 83 feet in 1 min 10 sec wihtout an AD 12/14: 200 ft in  ; 04/29/2021=Patient ambulated approx 60 feet yet had to stop due onset of nausea and unable to complete today; 07/15/2021= 330 feet in 2 min 25 sec without AD.  09/09/2021= 350 feet in 3 min 45 sec. 10/12/2021= 710 feet in 6 min- no AD (Short stand rest break)    Time 12    Period Weeks    Status Achieved   Target Date 07/22/21      PT LONG TERM GOAL #5   Title Pt will increase by at least 0.2 m/s in order to demonstrate clinically significant improvement in community ambulation.    Baseline 02/02/2021= 0.42 m/s 12/14: 0.49 m/s; 04/29/2021=Patient unable to test today  secondary to being nauseated. 06/08/2021= 0.62 m/s; 07/20/2021= 0.87 m/s    Time 12    Period Weeks    Status Achieved    Target Date 07/22/21      PT LONG TERM GOAL #6   Title Pt will decrease 5TSTS by at least 3 seconds in order to demonstrate clinically significant improvement in LE strength.    Baseline 07/20/2021= 17.75 sec with Minimal BUE Support; 09/09/2021= 17.50 sec with very minimal UE Support. 10/12/2021= 14.90 sec with min BUE Support. 12/05/2021= 14.30 sec with BUE; 01/09/2022= 23.11 sec with min UE on thighs vs. Arm rests for more emphasis on BLE.  03/15/2022= 17.46 sec without UE Support. 04/11/2022= 15.22 sec without UE support; 08/18/2022= 13.69 sec without UE Support   Time 12    Period Weeks    Status MET   Target Date 07/05/2022   PT LONG TERM GOAL #7 Title:  Pt will increase by at least 30m (130ft) in order to demonstrate clinically significant improvement in cardiopulmonary endurance and community ambulation                                                       Baseline: 10/12/2021= 710 feet; 12/05/2021= 827 feet; 01/09/2022= 620 feet without an AD - stopping at 4 min mark due to self reported fatigue. 03/15/2022= 1040 feet; 04/13/2022= Patient too fatigued to attempt after other testing. Will assess next visit. 06/07/2022= 750 feet in 4 min; 06/26/2022= 1350 feet  Goal status: MET Target date: 07/05/2022        PT LONG TERM GOAL #8  Title Patient will demonstrate an improved Berg Balance Score of > 50/56 as to demonstrate improved balance with ADLs such as sitting/standing and transfer balance and reduced fall risk.  Baseline 01/09/2022= 47/56; 04/12/2022= 53/56  Time 12   Period Weeks   Status Met  Target Date 04/03/2022   PT LONG TERM GOAL #9  Title Pt will increase BUE shoulder/elbow strength of  by at least 1/2 MMT grade in order to demonstrate improvement in strength and function    Baseline 04/13/2022= BUE = 4/5 throughtout ; 08/18/18- mostly 5/5 from elbow down and limited ROM  with shoulder but 4/5 in available range  Time 12   Period Weeks   Status Partially met  Target Date 07/05/2022   PT LONG TERM GOAL #10  Title Pt will transition from skilled PT interventions to gym based exercise program to demonstrate independence and continuation of performing exercises.    Baseline 08/18/2022- Patient has not attempted gym exercises  Time 12   Period Weeks   Status NEW  Target Date 11/10/2022       Plan -      Clinical Impression Statement Treatment focused on LE strengthening and instructed in new exercises in gym based setting. Patient performed well overall- taking rest breaks and working out with minimal resistance today. He was pleased with effort reporting little soreness but no reported difficulty with equipment. Patient will continue to benefit from skilled PT services for improved functional strength, endurance, and mobility until goals have been met. Will continue to modify session length/intensity as needed due to fluctuating comorbidities.    Personal Factors and Comorbidities Comorbidity 3+    Comorbidities DM, Liver transplant, ESRD, CAD, feeding tube    Examination-Activity Limitations Carry;Lift;Reach Overhead;Squat;Stairs;Stand;Transfers;Toileting    Examination-Participation Restrictions Cleaning;Community Activity;Driving;Medication Management;Meal Prep;Yard Work    Conservation officer, historic buildings Evolving/Moderate complexity    Rehab Potential Fair    PT Frequency 1x / week    PT Duration 12 weeks    PT Treatment/Interventions ADLs/Self Care Home Management;Cryotherapy;Moist Heat;DME Instruction;Gait training;Stair training;Functional mobility training;Therapeutic activities;Therapeutic exercise;Balance training;Neuromuscular re-education;Patient/family education;Wheelchair mobility training;Manual techniques;Passive range of motion;Dry needling;Energy conservation;Joint Manipulations    PT Next Visit Plan Progressive Therapeutic exercises in Well  zone    PT Home Exercise Plan No changes or updates today.    Consulted and Agree with Plan of Care Patient           Louis Meckel, PT Physical Therapist- [facility]  [facility]  08/30/2022, 8:47 AM

## 2022-08-31 ENCOUNTER — Ambulatory Visit: Payer: BLUE CROSS/BLUE SHIELD

## 2022-09-06 ENCOUNTER — Ambulatory Visit: Payer: Medicare Other

## 2022-09-06 DIAGNOSIS — R278 Other lack of coordination: Secondary | ICD-10-CM

## 2022-09-06 DIAGNOSIS — R262 Difficulty in walking, not elsewhere classified: Secondary | ICD-10-CM | POA: Diagnosis not present

## 2022-09-06 DIAGNOSIS — R269 Unspecified abnormalities of gait and mobility: Secondary | ICD-10-CM

## 2022-09-06 DIAGNOSIS — R296 Repeated falls: Secondary | ICD-10-CM

## 2022-09-06 DIAGNOSIS — M6281 Muscle weakness (generalized): Secondary | ICD-10-CM

## 2022-09-06 DIAGNOSIS — R2681 Unsteadiness on feet: Secondary | ICD-10-CM

## 2022-09-06 DIAGNOSIS — R2689 Other abnormalities of gait and mobility: Secondary | ICD-10-CM

## 2022-09-06 NOTE — Therapy (Signed)
OUTPATIENT PHYSICAL THERAPY TREATMENT NOTE   Patient Name: Alex Hampton MRN: [id number] DOB:10-08-1955, 67 y.o., male Today's Date: 09/06/2022  PCP: Dr. Dorothey Baseman REFERRING PROVIDER: Azucena Cecil, MD   PT End of Session - 09/06/22 0757     Visit Number 102    Number of Visits 112    Date for PT Re-Evaluation 11/10/22    Authorization Type Medicare/BCBS; PN 03/23/21    Authorization Time Period 04/12/22-07/05/22    Progress Note Due on Visit 110    PT Start Time 0758    PT Stop Time 0840    PT Time Calculation (min) 42 min    Equipment Utilized During Treatment Gait belt    Activity Tolerance Patient tolerated treatment well    Behavior During Therapy WFL for tasks assessed/performed                  Past Medical History:  Diagnosis Date   Depression    History of cardiac cath    History of heart artery stent    Hyperlipidemia    Hypertension    MI (myocardial infarction) (HCC)    Renal disorder    Past Surgical History:  Procedure Laterality Date   IR GJ TUBE CHANGE  11/22/2020   IR GJ TUBE CHANGE  11/26/2020   IR REPLC GASTRO/COLONIC TUBE PERCUT W/FLUORO  10/14/2020   LEFT HEART CATH AND CORONARY ANGIOGRAPHY N/A 08/19/2020   Procedure: LEFT HEART CATH AND CORONARY ANGIOGRAPHY;  Surgeon: Lamar Blinks, MD;  Location: [facility] INVASIVE CV LAB;  Service: Cardiovascular;  Laterality: N/A;   LIVER TRANSPLANT     Patient Active Problem List   Diagnosis Date Noted   Pulmonary embolism (HCC) 08/08/2021   CAP (community acquired pneumonia) 08/08/2021   Protein calorie malnutrition (HCC) 08/08/2021   Hypokalemia 08/08/2021   Pancreatic cyst    Verbal auditory hallucination    Early satiety    Feeding tube dysfunction    Generalized weakness 11/26/2020   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HCC) 11/26/2020   Recurrent falls 11/26/2020   Syncope 10/15/2020   Uremia 10/10/2020   ESRD on hemodialysis (HCC) 10/10/2020   Hyperkalemia 10/10/2020    Elevated troponin 10/10/2020   GERD (gastroesophageal reflux disease) 10/10/2020   CAD (coronary artery disease) 10/10/2020   G tube feedings (HCC) 10/10/2020   Diarrhea 10/10/2020   Diabetes mellitus (HCC) 10/10/2020   Weakness    ACS (acute coronary syndrome) (HCC)    Liver transplant recipient [facility])    Anemia of chronic disease    Type 2 diabetes mellitus with diabetic neuropathy, with long-term current use of insulin (HCC)    ESRD (end stage renal disease) (HCC) 08/19/2020   History of anemia due to chronic kidney disease 08/19/2020   Gait abnormality 10/17/2019   History of fall within past 90 days 10/17/2019   Visual hallucination 10/17/2019   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 10/17/2019   HTN (hypertension) 10/17/2019   History of MI (myocardial infarction) 10/17/2019   OSA (obstructive sleep apnea) 10/17/2019   Nondisplaced comminuted supracondylar fracture without intercondylar fracture of left humerus, subsequent encounter for fracture with nonunion 10/17/2019   Anxiety and depression 10/17/2019    REFERRING DIAG: Other malaise Z94.4 (ICD-10-CM) - Liver transplant status R26.9 (ICD-10-CM) - Unspecified abnormalities of gait and mobility   THERAPY DIAG:  Difficulty in walking, not elsewhere classified  Muscle weakness (generalized)  Other abnormalities of gait and mobility  Abnormality of gait and mobility  Other lack  of coordination  Unsteadiness on feet  Repeated falls  Rationale for Evaluation and Treatment Rehabilitation  PERTINENT HISTORY: Patient is a 67 year old male with recent referral for abnomality of gait and recent liver transplant in November 2021. He has past medical history significant for Liver transplant (02/18/2020), CKD, End stage renal disease- On hemodialyis T/TH/Sat., Anemia, DM with neuropathy, Non-stemi, Coronary artery disease, Feeding tube with poor appetite, PE now on eliquis   PRECAUTIONS: none  SUBJECTIVE:  Patient  reports doing well without significant stomach issues- States still has not made it to his apartment gym to check out the exercises yet.   PAIN:  Are you having pain? No   INTERVENTION  for 08/30/2022    Instructed in Well Zone LE strengthening exercises:    Incline bench press- cable- Level 1 - 2 sets x 6 Shoulder press- Attempted x 6 reps (stopped due to left Shoulder pain)  Bicep curl - Level 2 - 1 set of 12 and 2nd set at 6 reps (stopped due to fatigue)  Tricep press - level 2 (2 sets of 12 reps)  Lat pull down- Level 1 (2 sets of 12 reps)  Scap retract-   Knee ext: Seat (6) LE at (2)- resistance L2 x 12 reps  x 2 sets Knee flex: seat (6) LE  at (13) - resistance L2  x 12 reps x 2 sets Leg press: Seat (9) -  resistance L5- 12 reps x  2 sets  Calf press: Seat (9)- Resistance L5- 12 reps x 2 sets  Hip March/swing up/over DB x 10 reps each LE Sit to stand from bench- 10 reps  Pt educated throughout session about proper posture and technique with exercises. Improved exercise technique, movement at target joints, use of target muscles after min to mod verbal, visual, tactile cues.    PATIENT EDUCATION: Education details: Exercise technique, progress towards remainder goals Person educated: Patient Education method: Explanation, Demonstration, Tactile cues, and Verbal cues Education comprehension: verbalized understanding, returned demonstration, verbal cues required, tactile cues required, and needs further education   HOME EXERCISE PROGRAM: No updates   PT Short Term Goals -      PT SHORT TERM GOAL #1   Title Pt will be independent with HEP in order to improve strength and balance in order to decrease fall risk and improve function at home and work.    Baseline 02/02/2021: Patient reports limited HEP in place currently from [facility] agency 12/14 compliance; 1/20/2023Patient states no questions regarding his current exercise regimen and states he is compliant.    Time 6     Period Weeks    Status Achieved    Target Date 03/16/21              PT Long Term Goals -       PT LONG TERM GOAL #1   Title Pt will improve FOTO to target score of 50 to display perceived improvements in ability to complete ADL's.    Baseline 02/02/2021= 43 12/143: 51%; 04/29/2021= 53%; 09/09/2021= 53; 10/12/2021=54%   Time 12    Period Weeks    Status Achieved      PT LONG TERM GOAL #2   Title Pt will decrease 5TSTS by at least 8 seconds in order to demonstrate clinically significant improvement in LE strength.    Baseline 02/02/2021= 28.22 sec with BUE Support 12/14: 23.1 seconds; 04/29/2021=Unable to test today due to patient became nauseous with 6 min walk test and attempted TUG-  unable to continue- will reassess next 1-2 visits; 06/08/2021= 25.1 sec with min BUE support; 07/20/2021= 17.75 with min BUE support    Time 12    Period Weeks    Status Achieved    Target Date 07/22/21      PT LONG TERM GOAL #3   Title Pt will decrease TUG to below 19 seconds/decrease in order to demonstrate decreased fall risk.    Baseline 02/02/2021= 27.30 sec without AD 12/14: 23 seconds; 04/29/2021- Unable to assess as patient became very nauseated upon standing and request to stop treatment. 06/08/2021= 19.10 sec without an AD; 07/20/2021= 15.68 sec without UE Support    Time 12    Period Weeks    Status Achieved    Target Date 07/22/21      PT LONG TERM GOAL #4   Title Pt will increase by at least 65m (164ft) in order to demonstrate clinically significant improvement in cardiopulmonary endurance and community ambulation    Baseline 02/02/2021= 83 feet in 1 min 10 sec wihtout an AD 12/14: 200 ft in  ; 04/29/2021=Patient ambulated approx 60 feet yet had to stop due onset of nausea and unable to complete today; 07/15/2021= 330 feet in 2 min 25 sec without AD.  09/09/2021= 350 feet in 3 min 45 sec. 10/12/2021= 710 feet in 6 min- no AD (Short stand rest break)    Time 12    Period Weeks    Status  Achieved   Target Date 07/22/21      PT LONG TERM GOAL #5   Title Pt will increase by at least 0.2 m/s in order to demonstrate clinically significant improvement in community ambulation.    Baseline 02/02/2021= 0.42 m/s 12/14: 0.49 m/s; 04/29/2021=Patient unable to test today secondary to being nauseated. 06/08/2021= 0.62 m/s; 07/20/2021= 0.87 m/s    Time 12    Period Weeks    Status Achieved    Target Date 07/22/21      PT LONG TERM GOAL #6   Title Pt will decrease 5TSTS by at least 3 seconds in order to demonstrate clinically significant improvement in LE strength.    Baseline 07/20/2021= 17.75 sec with Minimal BUE Support; 09/09/2021= 17.50 sec with very minimal UE Support. 10/12/2021= 14.90 sec with min BUE Support. 12/05/2021= 14.30 sec with BUE; 01/09/2022= 23.11 sec with min UE on thighs vs. Arm rests for more emphasis on BLE.  03/15/2022= 17.46 sec without UE Support. 04/11/2022= 15.22 sec without UE support; 08/18/2022= 13.69 sec without UE Support   Time 12    Period Weeks    Status MET   Target Date 07/05/2022   PT LONG TERM GOAL #7 Title:  Pt will increase by at least 27m (110ft) in order to demonstrate clinically significant improvement in cardiopulmonary endurance and community ambulation                                                       Baseline: 10/12/2021= 710 feet; 12/05/2021= 827 feet; 01/09/2022= 620 feet without an AD - stopping at 4 min mark due to self reported fatigue. 03/15/2022= 1040 feet; 04/13/2022= Patient too fatigued to attempt after other testing. Will assess next visit. 06/07/2022= 750 feet in 4 min; 06/26/2022= 1350 feet  Goal status: MET Target date: 07/05/2022  PT LONG TERM GOAL #8  Title Patient will demonstrate an improved Berg Balance Score of > 50/56 as to demonstrate improved balance with ADLs such as sitting/standing and transfer balance and reduced fall risk.  Baseline 01/09/2022= 47/56; 04/12/2022= 53/56  Time 12   Period Weeks   Status Met  Target  Date 04/03/2022   PT LONG TERM GOAL #9  Title Pt will increase BUE shoulder/elbow strength of  by at least 1/2 MMT grade in order to demonstrate improvement in strength and function    Baseline 04/13/2022= BUE = 4/5 throughtout ; 08/18/18- mostly 5/5 from elbow down and limited ROM with shoulder but 4/5 in available range  Time 12   Period Weeks   Status Partially met  Target Date 07/05/2022   PT LONG TERM GOAL #10  Title Pt will transition from skilled PT interventions to gym based exercise program to demonstrate independence and continuation of performing exercises.    Baseline 08/18/2022- Patient has not attempted gym exercises  Time 12   Period Weeks   Status NEW  Target Date 11/10/2022       Plan -      Clinical Impression Statement Patient performed well with gym based exercises and able to incorporate UE/LE resistive exericse well today. He did require some assist to explain the machines and purpose of exercises. He was able to complete all with minimal rest break.   Patient will continue to benefit from skilled PT services for improved functional strength, endurance, and mobility until goals have been met. Will continue to modify session length/intensity as needed due to fluctuating comorbidities.    Personal Factors and Comorbidities Comorbidity 3+    Comorbidities DM, Liver transplant, ESRD, CAD, feeding tube    Examination-Activity Limitations Carry;Lift;Reach Overhead;Squat;Stairs;Stand;Transfers;Toileting    Examination-Participation Restrictions Cleaning;Community Activity;Driving;Medication Management;Meal Prep;Yard Work    Conservation officer, historic buildings Evolving/Moderate complexity    Rehab Potential Fair    PT Frequency 1x / week    PT Duration 12 weeks    PT Treatment/Interventions ADLs/Self Care Home Management;Cryotherapy;Moist Heat;DME Instruction;Gait training;Stair training;Functional mobility training;Therapeutic activities;Therapeutic exercise;Balance  training;Neuromuscular re-education;Patient/family education;Wheelchair mobility training;Manual techniques;Passive range of motion;Dry needling;Energy conservation;Joint Manipulations    PT Next Visit Plan Progressive Therapeutic exercises in Well zone    PT Home Exercise Plan No changes or updates today.    Consulted and Agree with Plan of Care Patient           Louis Meckel, PT Physical Therapist- [facility]  09/06/2022, 2:17 PM

## 2022-09-08 ENCOUNTER — Ambulatory Visit: Payer: Medicare Other

## 2022-09-08 DIAGNOSIS — R2689 Other abnormalities of gait and mobility: Secondary | ICD-10-CM

## 2022-09-08 DIAGNOSIS — R262 Difficulty in walking, not elsewhere classified: Secondary | ICD-10-CM

## 2022-09-08 DIAGNOSIS — R278 Other lack of coordination: Secondary | ICD-10-CM

## 2022-09-08 DIAGNOSIS — R2681 Unsteadiness on feet: Secondary | ICD-10-CM

## 2022-09-08 DIAGNOSIS — R269 Unspecified abnormalities of gait and mobility: Secondary | ICD-10-CM

## 2022-09-08 DIAGNOSIS — M6281 Muscle weakness (generalized): Secondary | ICD-10-CM

## 2022-09-08 NOTE — Therapy (Signed)
OUTPATIENT PHYSICAL THERAPY TREATMENT NOTE   Patient Name: Alex Hampton MRN: [id number] DOB:[birth year], 67 y.o., male Today's Date: 09/08/2022  PCP: Dr. Dorothey Baseman REFERRING PROVIDER: Azucena Cecil, MD   PT End of Session - 09/08/22 0800     Visit Number 103    Number of Visits 112    Date for PT Re-Evaluation 11/10/22    Authorization Type Medicare/BCBS; PN 03/23/21    Authorization Time Period 04/12/22-07/05/22    Progress Note Due on Visit 110    PT Start Time 0757    Equipment Utilized During Treatment Gait belt    Activity Tolerance Patient tolerated treatment well    Behavior During Therapy [facility] for tasks assessed/performed                   Past Medical History:  Diagnosis Date   Depression    History of cardiac cath    History of heart artery stent    Hyperlipidemia    Hypertension    MI (myocardial infarction) (HCC)    Renal disorder    Past Surgical History:  Procedure Laterality Date   IR GJ TUBE CHANGE  11/22/2020   IR GJ TUBE CHANGE  11/26/2020   IR REPLC GASTRO/COLONIC TUBE PERCUT W/FLUORO  10/14/2020   LEFT HEART CATH AND CORONARY ANGIOGRAPHY N/A 08/19/2020   Procedure: LEFT HEART CATH AND CORONARY ANGIOGRAPHY;  Surgeon: Lamar Blinks, MD;  Location: [facility] INVASIVE CV LAB;  Service: Cardiovascular;  Laterality: N/A;   LIVER TRANSPLANT     Patient Active Problem List   Diagnosis Date Noted   Pulmonary embolism (HCC) 08/08/2021   CAP (community acquired pneumonia) 08/08/2021   Protein calorie malnutrition (HCC) 08/08/2021   Hypokalemia 08/08/2021   Pancreatic cyst    Verbal auditory hallucination    Early satiety    Feeding tube dysfunction    Generalized weakness 11/26/2020   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HCC) 11/26/2020   Recurrent falls 11/26/2020   Syncope 10/15/2020   Uremia 10/10/2020   ESRD on hemodialysis (HCC) 10/10/2020   Hyperkalemia 10/10/2020   Elevated troponin 10/10/2020   GERD (gastroesophageal  reflux disease) 10/10/2020   CAD (coronary artery disease) 10/10/2020   G tube feedings (HCC) 10/10/2020   Diarrhea 10/10/2020   Diabetes mellitus (HCC) 10/10/2020   Weakness    ACS (acute coronary syndrome) (HCC)    Liver transplant recipient [facility])    Anemia of chronic disease    Type 2 diabetes mellitus with diabetic neuropathy, with long-term current use of insulin (HCC)    ESRD (end stage renal disease) (HCC) 08/19/2020   History of anemia due to chronic kidney disease 08/19/2020   Gait abnormality 10/17/2019   History of fall within past 90 days 10/17/2019   Visual hallucination 10/17/2019   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 10/17/2019   HTN (hypertension) 10/17/2019   History of MI (myocardial infarction) 10/17/2019   OSA (obstructive sleep apnea) 10/17/2019   Nondisplaced comminuted supracondylar fracture without intercondylar fracture of left humerus, subsequent encounter for fracture with nonunion 10/17/2019   Anxiety and depression 10/17/2019    REFERRING DIAG: Other malaise Z94.4 (ICD-10-CM) - Liver transplant status R26.9 (ICD-10-CM) - Unspecified abnormalities of gait and mobility   THERAPY DIAG:  Difficulty in walking, not elsewhere classified  Muscle weakness (generalized)  Other abnormalities of gait and mobility  Abnormality of gait and mobility  Other lack of coordination  Unsteadiness on feet  Rationale for Evaluation and Treatment Rehabilitation  PERTINENT  HISTORY: Patient is a 67 year old male with recent referral for abnomality of gait and recent liver transplant in November 2021. He has past medical history significant for Liver transplant (02/18/2020), CKD, End stage renal disease- On hemodialyis T/TH/Sat., Anemia, DM with neuropathy, Non-stemi, Coronary artery disease, Feeding tube with poor appetite, PE now on eliquis   PRECAUTIONS: none  SUBJECTIVE:  Patient reports he was tired after last session and had to lie down but okay so far  today.    PAIN:  Are you having pain? No   INTERVENTION  for 09/08/2022     Well Zone LE strengthening exercises:   Knee ext: Seat (6) LE at (2)- resistance L3 x 12 reps  x 3 sets Knee flex: seat (6) LE  at (13) - resistance L3 x 12 reps x 13 sets Standing mini lunges (beside leg press machine) x 10 reps alt LE Leg press: Seat (9) -  resistance L4- 12 reps x  1 set; 1 set of level 6 at 12 reps; 1 set of level 5 at 12 reps  Calf press: Seat (9)- Resistance L5- 12 reps x 3 sets     Incline bench press- cable- Level 1 - 2 sets x 6   Bicep curl - single arm at 7lb (2 sets of 12 reps)  Tricep press -  single arm 7 lb cable (2 sets of 12 reps)  Lat pull down- Level 1 (3 sets of 12 reps)  Scap retract-Level 1 (3 sets of 12 reps)     Hip March/swing up/over DB x 10 reps each LE Sit to stand from bench- 10 reps  Pt educated throughout session about proper posture and technique with exercises. Improved exercise technique, movement at target joints, use of target muscles after min to mod verbal, visual, tactile cues.    PATIENT EDUCATION: Education details: Exercise technique, progress towards remainder goals Person educated: Patient Education method: Explanation, Demonstration, Tactile cues, and Verbal cues Education comprehension: verbalized understanding, returned demonstration, verbal cues required, tactile cues required, and needs further education   HOME EXERCISE PROGRAM: No updates   PT Short Term Goals -      PT SHORT TERM GOAL #1   Title Pt will be independent with HEP in order to improve strength and balance in order to decrease fall risk and improve function at home and work.    Baseline 02/02/2021: Patient reports limited HEP in place currently from [facility] agency 12/14 compliance; 1/20/2023Patient states no questions regarding his current exercise regimen and states he is compliant.    Time 6    Period Weeks    Status Achieved    Target Date 03/16/21               PT Long Term Goals -       PT LONG TERM GOAL #1   Title Pt will improve FOTO to target score of 50 to display perceived improvements in ability to complete ADL's.    Baseline 02/02/2021= 43 12/143: 51%; 04/29/2021= 53%; 09/09/2021= 53; 10/12/2021=54%   Time 12    Period Weeks    Status Achieved      PT LONG TERM GOAL #2   Title Pt will decrease 5TSTS by at least 8 seconds in order to demonstrate clinically significant improvement in LE strength.    Baseline 02/02/2021= 28.22 sec with BUE Support 12/14: 23.1 seconds; 04/29/2021=Unable to test today due to patient became nauseous with 6 min walk test and attempted TUG- unable to  continue- will reassess next 1-2 visits; 06/08/2021= 25.1 sec with min BUE support; 07/20/2021= 17.75 with min BUE support    Time 12    Period Weeks    Status Achieved    Target Date 07/22/21      PT LONG TERM GOAL #3   Title Pt will decrease TUG to below 19 seconds/decrease in order to demonstrate decreased fall risk.    Baseline 02/02/2021= 27.30 sec without AD 12/14: 23 seconds; 04/29/2021- Unable to assess as patient became very nauseated upon standing and request to stop treatment. 06/08/2021= 19.10 sec without an AD; 07/20/2021= 15.68 sec without UE Support    Time 12    Period Weeks    Status Achieved    Target Date 07/22/21      PT LONG TERM GOAL #4   Title Pt will increase by at least 89m (148ft) in order to demonstrate clinically significant improvement in cardiopulmonary endurance and community ambulation    Baseline 02/02/2021= 83 feet in 1 min 10 sec wihtout an AD 12/14: 200 ft in  ; 04/29/2021=Patient ambulated approx 60 feet yet had to stop due onset of nausea and unable to complete today; 07/15/2021= 330 feet in 2 min 25 sec without AD.  09/09/2021= 350 feet in 3 min 45 sec. 10/12/2021= 710 feet in 6 min- no AD (Short stand rest break)    Time 12    Period Weeks    Status Achieved   Target Date 07/22/21      PT LONG TERM GOAL #5   Title Pt  will increase by at least 0.2 m/s in order to demonstrate clinically significant improvement in community ambulation.    Baseline 02/02/2021= 0.42 m/s 12/14: 0.49 m/s; 04/29/2021=Patient unable to test today secondary to being nauseated. 06/08/2021= 0.62 m/s; 07/20/2021= 0.87 m/s    Time 12    Period Weeks    Status Achieved    Target Date 07/22/21      PT LONG TERM GOAL #6   Title Pt will decrease 5TSTS by at least 3 seconds in order to demonstrate clinically significant improvement in LE strength.    Baseline 07/20/2021= 17.75 sec with Minimal BUE Support; 09/09/2021= 17.50 sec with very minimal UE Support. 10/12/2021= 14.90 sec with min BUE Support. 12/05/2021= 14.30 sec with BUE; 01/09/2022= 23.11 sec with min UE on thighs vs. Arm rests for more emphasis on BLE.  03/15/2022= 17.46 sec without UE Support. 04/11/2022= 15.22 sec without UE support; 08/18/2022= 13.69 sec without UE Support   Time 12    Period Weeks    Status MET   Target Date 07/05/2022   PT LONG TERM GOAL #7 Title:  Pt will increase by at least 100m (161ft) in order to demonstrate clinically significant improvement in cardiopulmonary endurance and community ambulation                                                       Baseline: 10/12/2021= 710 feet; 12/05/2021= 827 feet; 01/09/2022= 620 feet without an AD - stopping at 4 min mark due to self reported fatigue. 03/15/2022= 1040 feet; 04/13/2022= Patient too fatigued to attempt after other testing. Will assess next visit. 06/07/2022= 750 feet in 4 min; 06/26/2022= 1350 feet  Goal status: MET Target date: 07/05/2022        PT LONG  TERM GOAL #8  Title Patient will demonstrate an improved Berg Balance Score of > 50/56 as to demonstrate improved balance with ADLs such as sitting/standing and transfer balance and reduced fall risk.  Baseline 01/09/2022= 47/56; 04/12/2022= 53/56  Time 12   Period Weeks   Status Met  Target Date 04/03/2022   PT LONG TERM GOAL #9  Title Pt will increase BUE  shoulder/elbow strength of  by at least 1/2 MMT grade in order to demonstrate improvement in strength and function    Baseline 04/13/2022= BUE = 4/5 throughtout ; 08/18/18- mostly 5/5 from elbow down and limited ROM with shoulder but 4/5 in available range  Time 12   Period Weeks   Status Partially met  Target Date 07/05/2022   PT LONG TERM GOAL #10  Title Pt will transition from skilled PT interventions to gym based exercise program to demonstrate independence and continuation of performing exercises.    Baseline 08/18/2022- Patient has not attempted gym exercises  Time 12   Period Weeks   Status NEW  Target Date 11/10/2022       Plan -      Clinical Impression Statement Patient received in PT with excellent motivation today. He responded well with progression of gym based exercises. He was able to demo progress either in resistance or total reps today.   Patient will continue to benefit from skilled PT services for improved functional strength, endurance, and mobility until goals have been met. Will continue to modify session length/intensity as needed due to fluctuating comorbidities.    Personal Factors and Comorbidities Comorbidity 3+    Comorbidities DM, Liver transplant, ESRD, CAD, feeding tube    Examination-Activity Limitations Carry;Lift;Reach Overhead;Squat;Stairs;Stand;Transfers;Toileting    Examination-Participation Restrictions Cleaning;Community Activity;Driving;Medication Management;Meal Prep;Yard Work    Conservation officer, historic buildings Evolving/Moderate complexity    Rehab Potential Fair    PT Frequency 1x / week    PT Duration 12 weeks    PT Treatment/Interventions ADLs/Self Care Home Management;Cryotherapy;Moist Heat;DME Instruction;Gait training;Stair training;Functional mobility training;Therapeutic activities;Therapeutic exercise;Balance training;Neuromuscular re-education;Patient/family education;Wheelchair mobility training;Manual techniques;Passive range of  motion;Dry needling;Energy conservation;Joint Manipulations    PT Next Visit Plan Progressive Therapeutic exercises in Well zone    PT Home Exercise Plan No changes or updates today.    Consulted and Agree with Plan of Care Patient           Louis Meckel, PT Physical Therapist- [facility]  [facility]  09/08/2022, 8:01 AM

## 2022-09-11 ENCOUNTER — Ambulatory Visit: Payer: Medicare Other | Attending: Gastroenterology

## 2022-09-11 DIAGNOSIS — M6281 Muscle weakness (generalized): Secondary | ICD-10-CM | POA: Diagnosis present

## 2022-09-11 DIAGNOSIS — R2681 Unsteadiness on feet: Secondary | ICD-10-CM

## 2022-09-11 DIAGNOSIS — R269 Unspecified abnormalities of gait and mobility: Secondary | ICD-10-CM

## 2022-09-11 DIAGNOSIS — R278 Other lack of coordination: Secondary | ICD-10-CM

## 2022-09-11 DIAGNOSIS — R262 Difficulty in walking, not elsewhere classified: Secondary | ICD-10-CM

## 2022-09-11 DIAGNOSIS — R2689 Other abnormalities of gait and mobility: Secondary | ICD-10-CM

## 2022-09-11 NOTE — Therapy (Signed)
OUTPATIENT PHYSICAL THERAPY TREATMENT NOTE   Patient Name: Alex Hampton MRN: [id number] DOB:February 01, 1956, 67 y.o., male Today's Date: 09/11/2022  PCP: Dr. Dorothey Baseman REFERRING PROVIDER: Azucena Cecil, MD   PT End of Session - 09/11/22 1021     Visit Number 104    Number of Visits 112    Date for PT Re-Evaluation 11/10/22    Authorization Type Medicare/BCBS; PN 03/23/21    Authorization Time Period 04/12/22-07/05/22    Progress Note Due on Visit 110    PT Start Time 1019    PT Stop Time 1049    PT Time Calculation (min) 30 min    Equipment Utilized During Treatment Gait belt    Activity Tolerance Patient tolerated treatment well    Behavior During Therapy WFL for tasks assessed/performed                   Past Medical History:  Diagnosis Date   Depression    History of cardiac cath    History of heart artery stent    Hyperlipidemia    Hypertension    MI (myocardial infarction) (HCC)    Renal disorder    Past Surgical History:  Procedure Laterality Date   IR GJ TUBE CHANGE  11/22/2020   IR GJ TUBE CHANGE  11/26/2020   IR REPLC GASTRO/COLONIC TUBE PERCUT W/FLUORO  10/14/2020   LEFT HEART CATH AND CORONARY ANGIOGRAPHY N/A 08/19/2020   Procedure: LEFT HEART CATH AND CORONARY ANGIOGRAPHY;  Surgeon: Lamar Blinks, MD;  Location: [facility] INVASIVE CV LAB;  Service: Cardiovascular;  Laterality: N/A;   LIVER TRANSPLANT     Patient Active Problem List   Diagnosis Date Noted   Pulmonary embolism (HCC) 08/08/2021   CAP (community acquired pneumonia) 08/08/2021   Protein calorie malnutrition (HCC) 08/08/2021   Hypokalemia 08/08/2021   Pancreatic cyst    Verbal auditory hallucination    Early satiety    Feeding tube dysfunction    Generalized weakness 11/26/2020   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HCC) 11/26/2020   Recurrent falls 11/26/2020   Syncope 10/15/2020   Uremia 10/10/2020   ESRD on hemodialysis (HCC) 10/10/2020   Hyperkalemia 10/10/2020    Elevated troponin 10/10/2020   GERD (gastroesophageal reflux disease) 10/10/2020   CAD (coronary artery disease) 10/10/2020   G tube feedings (HCC) 10/10/2020   Diarrhea 10/10/2020   Diabetes mellitus (HCC) 10/10/2020   Weakness    ACS (acute coronary syndrome) (HCC)    Liver transplant recipient [facility])    Anemia of chronic disease    Type 2 diabetes mellitus with diabetic neuropathy, with long-term current use of insulin (HCC)    ESRD (end stage renal disease) (HCC) 08/19/2020   History of anemia due to chronic kidney disease 08/19/2020   Gait abnormality 10/17/2019   History of fall within past 90 days 10/17/2019   Visual hallucination 10/17/2019   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 10/17/2019   HTN (hypertension) 10/17/2019   History of MI (myocardial infarction) 10/17/2019   OSA (obstructive sleep apnea) 10/17/2019   Nondisplaced comminuted supracondylar fracture without intercondylar fracture of left humerus, subsequent encounter for fracture with nonunion 10/17/2019   Anxiety and depression 10/17/2019    REFERRING DIAG: Other malaise Z94.4 (ICD-10-CM) - Liver transplant status R26.9 (ICD-10-CM) - Unspecified abnormalities of gait and mobility   THERAPY DIAG:  Difficulty in walking, not elsewhere classified  Muscle weakness (generalized)  Other abnormalities of gait and mobility  Abnormality of gait and mobility  Other  lack of coordination  Unsteadiness on feet  Rationale for Evaluation and Treatment Rehabilitation  PERTINENT HISTORY: Patient is a 67 year old male with recent referral for abnomality of gait and recent liver transplant in November 2021. He has past medical history significant for Liver transplant (02/18/2020), CKD, End stage renal disease- On hemodialyis T/TH/Sat., Anemia, DM with neuropathy, Non-stemi, Coronary artery disease, Feeding tube with poor appetite, PE now on eliquis   PRECAUTIONS: none  SUBJECTIVE:  Patient reports having a good  weekend and no new issues. He states trying to go to his gym at his apartment this week to check it out.    PAIN:  Are you having pain? No   INTERVENTION  for 09/11/2022     Well Zone LE strengthening exercises:    Leg press: Seat (9) -  resistance L5- 4 sets of 12 reps Calf press: Seat (9)- Resistance L5- 3 sets of 12 reps Knee flex: seat (6) LE  at (13) - resistance L3 x 12 reps x 13 sets Knee ext: Seat (6) LE at (3)- resistance L3 x 12 reps  x 3 sets Standing mini lunges (beside treadmill) x 12 reps alt LE Standing Single leg stance with opp LE performing hip circles x 12 reps each Standing mini squats with 5lb DB each arm 2 sets of 10 reps *patient stopped due to fatigue  Pt educated throughout session about proper posture and technique with exercises. Improved exercise technique, movement at target joints, use of target muscles after min to mod verbal, visual, tactile cues.    PATIENT EDUCATION: Education details: Exercise technique, progress towards remainder goals Person educated: Patient Education method: Explanation, Demonstration, Tactile cues, and Verbal cues Education comprehension: verbalized understanding, returned demonstration, verbal cues required, tactile cues required, and needs further education   HOME EXERCISE PROGRAM: No updates   PT Short Term Goals -      PT SHORT TERM GOAL #1   Title Pt will be independent with HEP in order to improve strength and balance in order to decrease fall risk and improve function at home and work.    Baseline 02/02/2021: Patient reports limited HEP in place currently from [facility] agency 12/14 compliance; 1/20/2023Patient states no questions regarding his current exercise regimen and states he is compliant.    Time 6    Period Weeks    Status Achieved    Target Date 03/16/21              PT Long Term Goals -       PT LONG TERM GOAL #1   Title Pt will improve FOTO to target score of 50 to display perceived  improvements in ability to complete ADL's.    Baseline 02/02/2021= 43 12/143: 51%; 04/29/2021= 53%; 09/09/2021= 53; 10/12/2021=54%   Time 12    Period Weeks    Status Achieved      PT LONG TERM GOAL #2   Title Pt will decrease 5TSTS by at least 8 seconds in order to demonstrate clinically significant improvement in LE strength.    Baseline 02/02/2021= 28.22 sec with BUE Support 12/14: 23.1 seconds; 04/29/2021=Unable to test today due to patient became nauseous with 6 min walk test and attempted TUG- unable to continue- will reassess next 1-2 visits; 06/08/2021= 25.1 sec with min BUE support; 07/20/2021= 17.75 with min BUE support    Time 12    Period Weeks    Status Achieved    Target Date 07/22/21      PT  LONG TERM GOAL #3   Title Pt will decrease TUG to below 19 seconds/decrease in order to demonstrate decreased fall risk.    Baseline 02/02/2021= 27.30 sec without AD 12/14: 23 seconds; 04/29/2021- Unable to assess as patient became very nauseated upon standing and request to stop treatment. 06/08/2021= 19.10 sec without an AD; 07/20/2021= 15.68 sec without UE Support    Time 12    Period Weeks    Status Achieved    Target Date 07/22/21      PT LONG TERM GOAL #4   Title Pt will increase by at least 85m (169ft) in order to demonstrate clinically significant improvement in cardiopulmonary endurance and community ambulation    Baseline 02/02/2021= 83 feet in 1 min 10 sec wihtout an AD 12/14: 200 ft in  ; 04/29/2021=Patient ambulated approx 60 feet yet had to stop due onset of nausea and unable to complete today; 07/15/2021= 330 feet in 2 min 25 sec without AD.  09/09/2021= 350 feet in 3 min 45 sec. 10/12/2021= 710 feet in 6 min- no AD (Short stand rest break)    Time 12    Period Weeks    Status Achieved   Target Date 07/22/21      PT LONG TERM GOAL #5   Title Pt will increase by at least 0.2 m/s in order to demonstrate clinically significant improvement in community ambulation.     Baseline 02/02/2021= 0.42 m/s 12/14: 0.49 m/s; 04/29/2021=Patient unable to test today secondary to being nauseated. 06/08/2021= 0.62 m/s; 07/20/2021= 0.87 m/s    Time 12    Period Weeks    Status Achieved    Target Date 07/22/21      PT LONG TERM GOAL #6   Title Pt will decrease 5TSTS by at least 3 seconds in order to demonstrate clinically significant improvement in LE strength.    Baseline 07/20/2021= 17.75 sec with Minimal BUE Support; 09/09/2021= 17.50 sec with very minimal UE Support. 10/12/2021= 14.90 sec with min BUE Support. 12/05/2021= 14.30 sec with BUE; 01/09/2022= 23.11 sec with min UE on thighs vs. Arm rests for more emphasis on BLE.  03/15/2022= 17.46 sec without UE Support. 04/11/2022= 15.22 sec without UE support; 08/18/2022= 13.69 sec without UE Support   Time 12    Period Weeks    Status MET   Target Date 07/05/2022   PT LONG TERM GOAL #7 Title:  Pt will increase by at least 60m (172ft) in order to demonstrate clinically significant improvement in cardiopulmonary endurance and community ambulation                                                       Baseline: 10/12/2021= 710 feet; 12/05/2021= 827 feet; 01/09/2022= 620 feet without an AD - stopping at 4 min mark due to self reported fatigue. 03/15/2022= 1040 feet; 04/13/2022= Patient too fatigued to attempt after other testing. Will assess next visit. 06/07/2022= 750 feet in 4 min; 06/26/2022= 1350 feet  Goal status: MET Target date: 07/05/2022        PT LONG TERM GOAL #8  Title Patient will demonstrate an improved Berg Balance Score of > 50/56 as to demonstrate improved balance with ADLs such as sitting/standing and transfer balance and reduced fall risk.  Baseline 01/09/2022= 47/56; 04/12/2022= 53/56  Time 12   Period Weeks  Status Met  Target Date 04/03/2022   PT LONG TERM GOAL #9  Title Pt will increase BUE shoulder/elbow strength of  by at least 1/2 MMT grade in order to demonstrate improvement in strength and function    Baseline  04/13/2022= BUE = 4/5 throughtout ; 08/18/18- mostly 5/5 from elbow down and limited ROM with shoulder but 4/5 in available range  Time 12   Period Weeks   Status Partially met  Target Date 07/05/2022   PT LONG TERM GOAL #10  Title Pt will transition from skilled PT interventions to gym based exercise program to demonstrate independence and continuation of performing exercises.    Baseline 08/18/2022- Patient has not attempted gym exercises  Time 12   Period Weeks   Status NEW  Target Date 11/10/2022       Plan -      Clinical Impression Statement Patient started off very well- Able to add another set to leg press and less overall VC to operate all LE strengthening equipment today. He was performing well- minimal VC to slow down and calf raises technique. He abruptly ended session which has not happened in a while due to increased onset of fatigue. He continues to present with fatigue as most limiting factor at this time. Patient will continue to benefit from skilled PT services for improved functional strength, endurance, and mobility until goals have been met. Will continue to modify session length/intensity as needed due to fluctuating comorbidities.    Personal Factors and Comorbidities Comorbidity 3+    Comorbidities DM, Liver transplant, ESRD, CAD, feeding tube    Examination-Activity Limitations Carry;Lift;Reach Overhead;Squat;Stairs;Stand;Transfers;Toileting    Examination-Participation Restrictions Cleaning;Community Activity;Driving;Medication Management;Meal Prep;Yard Work    Conservation officer, historic buildings Evolving/Moderate complexity    Rehab Potential Fair    PT Frequency 1x / week    PT Duration 12 weeks    PT Treatment/Interventions ADLs/Self Care Home Management;Cryotherapy;Moist Heat;DME Instruction;Gait training;Stair training;Functional mobility training;Therapeutic activities;Therapeutic exercise;Balance training;Neuromuscular re-education;Patient/family  education;Wheelchair mobility training;Manual techniques;Passive range of motion;Dry needling;Energy conservation;Joint Manipulations    PT Next Visit Plan Progressive Therapeutic exercises in Well zone    PT Home Exercise Plan No changes or updates today.    Consulted and Agree with Plan of Care Patient           Louis Meckel, PT Physical Therapist- [facility]  [facility]  09/11/2022, 10:59 AM

## 2022-09-20 ENCOUNTER — Ambulatory Visit: Payer: Medicare Other

## 2022-09-20 DIAGNOSIS — R262 Difficulty in walking, not elsewhere classified: Secondary | ICD-10-CM | POA: Diagnosis not present

## 2022-09-20 DIAGNOSIS — R278 Other lack of coordination: Secondary | ICD-10-CM

## 2022-09-20 DIAGNOSIS — R2689 Other abnormalities of gait and mobility: Secondary | ICD-10-CM

## 2022-09-20 DIAGNOSIS — M6281 Muscle weakness (generalized): Secondary | ICD-10-CM

## 2022-09-20 DIAGNOSIS — R2681 Unsteadiness on feet: Secondary | ICD-10-CM

## 2022-09-20 DIAGNOSIS — R269 Unspecified abnormalities of gait and mobility: Secondary | ICD-10-CM

## 2022-09-20 NOTE — Therapy (Signed)
OUTPATIENT PHYSICAL THERAPY TREATMENT NOTE   Patient Name: Alex Hampton MRN: [id number] DOB:13-Jun-1955, 67 y.o., male Today's Date: 09/20/2022  PCP: Dr. Dorothey Baseman REFERRING PROVIDER: Azucena Cecil, MD   PT End of Session - 09/20/22 0801     Visit Number 105    Number of Visits 112    Date for PT Re-Evaluation 11/10/22    Authorization Type Medicare/BCBS; PN 03/23/21    Authorization Time Period 04/12/22-07/05/22    Progress Note Due on Visit 110    PT Start Time 0800    PT Stop Time 0830    PT Time Calculation (min) 30 min    Equipment Utilized During Treatment Gait belt    Activity Tolerance Patient tolerated treatment well    Behavior During Therapy WFL for tasks assessed/performed                   Past Medical History:  Diagnosis Date   Depression    History of cardiac cath    History of heart artery stent    Hyperlipidemia    Hypertension    MI (myocardial infarction) (HCC)    Renal disorder    Past Surgical History:  Procedure Laterality Date   IR GJ TUBE CHANGE  11/22/2020   IR GJ TUBE CHANGE  11/26/2020   IR REPLC GASTRO/COLONIC TUBE PERCUT W/FLUORO  10/14/2020   LEFT HEART CATH AND CORONARY ANGIOGRAPHY N/A 08/19/2020   Procedure: LEFT HEART CATH AND CORONARY ANGIOGRAPHY;  Surgeon: Lamar Blinks, MD;  Location: [facility] INVASIVE CV [facility];  Service: Cardiovascular;  Laterality: N/A;   LIVER TRANSPLANT     Patient Active Problem List   Diagnosis Date Noted   Pulmonary embolism (HCC) 08/08/2021   CAP (community acquired pneumonia) 08/08/2021   Protein calorie malnutrition (HCC) 08/08/2021   Hypokalemia 08/08/2021   Pancreatic cyst    Verbal auditory hallucination    Early satiety    Feeding tube dysfunction    Generalized weakness 11/26/2020   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HCC) 11/26/2020   Recurrent falls 11/26/2020   Syncope 10/15/2020   Uremia 10/10/2020   ESRD on hemodialysis (HCC) 10/10/2020   Hyperkalemia 10/10/2020    Elevated troponin 10/10/2020   GERD (gastroesophageal reflux disease) 10/10/2020   CAD (coronary artery disease) 10/10/2020   G tube feedings (HCC) 10/10/2020   Diarrhea 10/10/2020   Diabetes mellitus (HCC) 10/10/2020   Weakness    ACS (acute coronary syndrome) (HCC)    Liver transplant recipient [facility])    Anemia of chronic disease    Type 2 diabetes mellitus with diabetic neuropathy, with long-term current use of insulin (HCC)    ESRD (end stage renal disease) (HCC) 08/19/2020   History of anemia due to chronic kidney disease 08/19/2020   Gait abnormality 10/17/2019   History of fall within past 90 days 10/17/2019   Visual hallucination 10/17/2019   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 10/17/2019   HTN (hypertension) 10/17/2019   History of MI (myocardial infarction) 10/17/2019   OSA (obstructive sleep apnea) 10/17/2019   Nondisplaced comminuted supracondylar fracture without intercondylar fracture of left humerus, subsequent encounter for fracture with nonunion 10/17/2019   Anxiety and depression 10/17/2019    REFERRING DIAG: Other malaise Z94.4 (ICD-10-CM) - Liver transplant status R26.9 (ICD-10-CM) - Unspecified abnormalities of gait and mobility   THERAPY DIAG:  Difficulty in walking, not elsewhere classified  Muscle weakness (generalized)  Other abnormalities of gait and mobility  Abnormality of gait and mobility  Other  lack of coordination  Unsteadiness on feet  Rationale for Evaluation and Treatment Rehabilitation  PERTINENT HISTORY: Patient is a 67 year old male with recent referral for abnomality of gait and recent liver transplant in November 2021. He has past medical history significant for Liver transplant (02/18/2020), CKD, End stage renal disease- On hemodialyis T/TH/Sat., Anemia, DM with neuropathy, Non-stemi, Coronary artery disease, Feeding tube with poor appetite, PE now on eliquis   PRECAUTIONS: none  SUBJECTIVE:  Patient reports having a good  weekend and no new issues. He states trying to go to his gym at his apartment this week to check it out.    PAIN:  Are you having pain? No   INTERVENTION  for 09/11/2022     Well Zone LE strengthening exercises:    Knee Flex: Seat (6) LE at (3)- Pyramid- L3 x 12 reps; L4 x 10 reps; L5 x 8 reps Knee Ext: seat (6) LE  at (13) - Pyramid- L3 x 12 reps; L4 x 10 reps; L5 x 7 reps  Instructed in use of Eliptical, Seated bike and patient practiced each - no issues getting on/off equipment- very limited exertion today at L1 approx 3-5 min machine.  Free weights: Instructed in the following  DB- single arm curl 5# x 8 reps each DB- Overhead tricep press 5# x 10 reps each DB- Tricep kick back 5# x 10 reps  *Patient abruptly stopped stating he could no longer continue.    Pt educated throughout session about proper posture and technique with exercises. Improved exercise technique, movement at target joints, use of target muscles after min to mod verbal, visual, tactile cues.    PATIENT EDUCATION: Education details: Exercise technique, Instruction in gym based equipment Person educated: Patient Education method: Explanation, Demonstration, Tactile cues, and Verbal cues Education comprehension: verbalized understanding, returned demonstration, verbal cues required, tactile cues required, and needs further education   HOME EXERCISE PROGRAM: No updates   PT Short Term Goals -      PT SHORT TERM GOAL #1   Title Pt will be independent with HEP in order to improve strength and balance in order to decrease fall risk and improve function at home and work.    Baseline 02/02/2021: Patient reports limited HEP in place currently from [facility] agency 12/14 compliance; 1/20/2023Patient states no questions regarding his current exercise regimen and states he is compliant.    Time 6    Period Weeks    Status Achieved    Target Date 03/16/21              PT Long Term Goals -       PT LONG TERM  GOAL #1   Title Pt will improve FOTO to target score of 50 to display perceived improvements in ability to complete ADL's.    Baseline 02/02/2021= 43 12/143: 51%; 04/29/2021= 53%; 09/09/2021= 53; 10/12/2021=54%   Time 12    Period Weeks    Status Achieved      PT LONG TERM GOAL #2   Title Pt will decrease 5TSTS by at least 8 seconds in order to demonstrate clinically significant improvement in LE strength.    Baseline 02/02/2021= 28.22 sec with BUE Support 12/14: 23.1 seconds; 04/29/2021=Unable to test today due to patient became nauseous with 6 min walk test and attempted TUG- unable to continue- will reassess next 1-2 visits; 06/08/2021= 25.1 sec with min BUE support; 07/20/2021= 17.75 with min BUE support    Time 12    Period  Weeks    Status Achieved    Target Date 07/22/21      PT LONG TERM GOAL #3   Title Pt will decrease TUG to below 19 seconds/decrease in order to demonstrate decreased fall risk.    Baseline 02/02/2021= 27.30 sec without AD 12/14: 23 seconds; 04/29/2021- Unable to assess as patient became very nauseated upon standing and request to stop treatment. 06/08/2021= 19.10 sec without an AD; 07/20/2021= 15.68 sec without UE Support    Time 12    Period Weeks    Status Achieved    Target Date 07/22/21      PT LONG TERM GOAL #4   Title Pt will increase by at least 83m (126ft) in order to demonstrate clinically significant improvement in cardiopulmonary endurance and community ambulation    Baseline 02/02/2021= 83 feet in 1 min 10 sec wihtout an AD 12/14: 200 ft in  ; 04/29/2021=Patient ambulated approx 60 feet yet had to stop due onset of nausea and unable to complete today; 07/15/2021= 330 feet in 2 min 25 sec without AD.  09/09/2021= 350 feet in 3 min 45 sec. 10/12/2021= 710 feet in 6 min- no AD (Short stand rest break)    Time 12    Period Weeks    Status Achieved   Target Date 07/22/21      PT LONG TERM GOAL #5   Title Pt will increase by at least 0.2 m/s in order to  demonstrate clinically significant improvement in community ambulation.    Baseline 02/02/2021= 0.42 m/s 12/14: 0.49 m/s; 04/29/2021=Patient unable to test today secondary to being nauseated. 06/08/2021= 0.62 m/s; 07/20/2021= 0.87 m/s    Time 12    Period Weeks    Status Achieved    Target Date 07/22/21      PT LONG TERM GOAL #6   Title Pt will decrease 5TSTS by at least 3 seconds in order to demonstrate clinically significant improvement in LE strength.    Baseline 07/20/2021= 17.75 sec with Minimal BUE Support; 09/09/2021= 17.50 sec with very minimal UE Support. 10/12/2021= 14.90 sec with min BUE Support. 12/05/2021= 14.30 sec with BUE; 01/09/2022= 23.11 sec with min UE on thighs vs. Arm rests for more emphasis on BLE.  03/15/2022= 17.46 sec without UE Support. 04/11/2022= 15.22 sec without UE support; 08/18/2022= 13.69 sec without UE Support   Time 12    Period Weeks    Status MET   Target Date 07/05/2022   PT LONG TERM GOAL #7 Title:  Pt will increase by at least 37m (126ft) in order to demonstrate clinically significant improvement in cardiopulmonary endurance and community ambulation                                                       Baseline: 10/12/2021= 710 feet; 12/05/2021= 827 feet; 01/09/2022= 620 feet without an AD - stopping at 4 min mark due to self reported fatigue. 03/15/2022= 1040 feet; 04/13/2022= Patient too fatigued to attempt after other testing. Will assess next visit. 06/07/2022= 750 feet in 4 min; 06/26/2022= 1350 feet  Goal status: MET Target date: 07/05/2022        PT LONG TERM GOAL #8  Title Patient will demonstrate an improved Berg Balance Score of > 50/56 as to demonstrate improved balance with ADLs such as sitting/standing and transfer  balance and reduced fall risk.  Baseline 01/09/2022= 47/56; 04/12/2022= 53/56  Time 12   Period Weeks   Status Met  Target Date 04/03/2022   PT LONG TERM GOAL #9  Title Pt will increase BUE shoulder/elbow strength of  by at least 1/2 MMT grade  in order to demonstrate improvement in strength and function    Baseline 04/13/2022= BUE = 4/5 throughtout ; 08/18/18- mostly 5/5 from elbow down and limited ROM with shoulder but 4/5 in available range  Time 12   Period Weeks   Status Partially met  Target Date 07/05/2022   PT LONG TERM GOAL #10  Title Pt will transition from skilled PT interventions to gym based exercise program to demonstrate independence and continuation of performing exercises.    Baseline 08/18/2022- Patient has not attempted gym exercises  Time 12   Period Weeks   Status NEW  Target Date 11/10/2022       Plan -      Clinical Impression Statement Treatment focused on continuing with education/instruction with gym based Equipment and exercises. Patient was performing well initially- able to participate with pyramid strengthening and able to follow all PT instruction but abruptly ended session stating he was "tired." Discussed pacing himself during exercises to improve his overall endurance and tolerance to exercise. He is becoming more familiar with  LE gym equipment although still uncertain as to exactly which equipment he actually has in his apartment gym. Daughter reports she glanced inside of gym and saw a treadmill, Eliptical, row machine, weights, and a seated bike but states unsure if that is all they have. He is approaching independence with basic UE/LE gym equipment and will likely be appropriate for discharge next week. Patient will continue to benefit from skilled PT services for improved functional strength, endurance, and mobility until goals have been met. Will continue to modify session length/intensity as needed due to fluctuating comorbidities.    Personal Factors and Comorbidities Comorbidity 3+    Comorbidities DM, Liver transplant, ESRD, CAD, feeding tube    Examination-Activity Limitations Carry;Lift;Reach Overhead;Squat;Stairs;Stand;Transfers;Toileting    Examination-Participation Restrictions  Cleaning;Community Activity;Driving;Medication Management;Meal Prep;Yard Work    Conservation officer, historic buildings Evolving/Moderate complexity    Rehab Potential Fair    PT Frequency 1x / week    PT Duration 12 weeks    PT Treatment/Interventions ADLs/Self Care Home Management;Cryotherapy;Moist Heat;DME Instruction;Gait training;Stair training;Functional mobility training;Therapeutic activities;Therapeutic exercise;Balance training;Neuromuscular re-education;Patient/family education;Wheelchair mobility training;Manual techniques;Passive range of motion;Dry needling;Energy conservation;Joint Manipulations    PT Next Visit Plan Progressive Therapeutic exercises in Well zone    PT Home Exercise Plan No changes or updates today.    Consulted and Agree with Plan of Care Patient           Louis Meckel, PT Physical Therapist- [facility]  [facility]  09/20/2022, 9:46 AM

## 2022-09-22 ENCOUNTER — Ambulatory Visit: Payer: Medicare Other

## 2022-09-22 DIAGNOSIS — R2689 Other abnormalities of gait and mobility: Secondary | ICD-10-CM

## 2022-09-22 DIAGNOSIS — M6281 Muscle weakness (generalized): Secondary | ICD-10-CM

## 2022-09-22 DIAGNOSIS — R262 Difficulty in walking, not elsewhere classified: Secondary | ICD-10-CM | POA: Diagnosis not present

## 2022-09-22 DIAGNOSIS — R269 Unspecified abnormalities of gait and mobility: Secondary | ICD-10-CM

## 2022-09-22 DIAGNOSIS — R2681 Unsteadiness on feet: Secondary | ICD-10-CM

## 2022-09-22 DIAGNOSIS — R278 Other lack of coordination: Secondary | ICD-10-CM

## 2022-09-22 NOTE — Therapy (Signed)
OUTPATIENT PHYSICAL THERAPY TREATMENT    Patient Name: Alex Hampton MRN: [id number] DOB:01-13-1956, 67 y.o., male Today's Date: 09/22/2022  PCP: Dr. Dorothey Baseman REFERRING PROVIDER: Azucena Cecil, MD   PT End of Session - 09/22/22 0941     Visit Number 106    Number of Visits 112    Date for PT Re-Evaluation 11/10/22    Authorization Type Medicare/BCBS    Progress Note Due on Visit 110    PT Start Time 0930    PT Stop Time 1010    PT Time Calculation (min) 40 min    Equipment Utilized During Treatment Gait belt    Activity Tolerance Patient tolerated treatment well    Behavior During Therapy WFL for tasks assessed/performed              Past Medical History:  Diagnosis Date   Depression    History of cardiac cath    History of heart artery stent    Hyperlipidemia    Hypertension    MI (myocardial infarction) (HCC)    Renal disorder    Past Surgical History:  Procedure Laterality Date   IR GJ TUBE CHANGE  11/22/2020   IR GJ TUBE CHANGE  11/26/2020   IR REPLC GASTRO/COLONIC TUBE PERCUT W/FLUORO  10/14/2020   LEFT HEART CATH AND CORONARY ANGIOGRAPHY N/A 08/19/2020   Procedure: LEFT HEART CATH AND CORONARY ANGIOGRAPHY;  Surgeon: Lamar Blinks, MD;  Location: [facility] INVASIVE [facility];  Service: Cardiovascular;  Laterality: N/A;   LIVER TRANSPLANT     Patient Active Problem List   Diagnosis Date Noted   Pulmonary embolism (HCC) 08/08/2021   CAP (community acquired pneumonia) 08/08/2021   Protein calorie malnutrition (HCC) 08/08/2021   Hypokalemia 08/08/2021   Pancreatic cyst    Verbal auditory hallucination    Early satiety    Feeding tube dysfunction    Generalized weakness 11/26/2020   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HCC) 11/26/2020   Recurrent falls 11/26/2020   Syncope 10/15/2020   Uremia 10/10/2020   ESRD on hemodialysis (HCC) 10/10/2020   Hyperkalemia 10/10/2020   Elevated troponin 10/10/2020   GERD (gastroesophageal reflux disease)  10/10/2020   CAD (coronary artery disease) 10/10/2020   G tube feedings (HCC) 10/10/2020   Diarrhea 10/10/2020   Diabetes mellitus (HCC) 10/10/2020   Weakness    ACS (acute coronary syndrome) (HCC)    Liver transplant recipient [facility])    Anemia of chronic disease    Type 2 diabetes mellitus with diabetic neuropathy, with long-term current use of insulin (HCC)    ESRD (end stage renal disease) (HCC) 08/19/2020   History of anemia due to chronic kidney disease 08/19/2020   Gait abnormality 10/17/2019   History of fall within past 90 days 10/17/2019   Visual hallucination 10/17/2019   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 10/17/2019   HTN (hypertension) 10/17/2019   History of MI (myocardial infarction) 10/17/2019   OSA (obstructive sleep apnea) 10/17/2019   Nondisplaced comminuted supracondylar fracture without intercondylar fracture of left humerus, subsequent encounter for fracture with nonunion 10/17/2019   Anxiety and depression 10/17/2019    REFERRING DIAG: Other malaise Z94.4 (ICD-10-CM) - Liver transplant status R26.9 (ICD-10-CM) - Unspecified abnormalities of gait and mobility   THERAPY DIAG:  Difficulty in walking, not elsewhere classified  Muscle weakness (generalized)  Other abnormalities of gait and mobility  Abnormality of gait and mobility  Other lack of coordination  Unsteadiness on feet  Rationale for Evaluation and Treatment Rehabilitation  PERTINENT HISTORY: Patient is a 67 year old male with recent referral for abnomality of gait and recent liver transplant in November 2021. He has past medical history significant for Liver transplant (02/18/2020), CKD, End stage renal disease- On hemodialyis T/TH/Sat, Anemia, DM with neuropathy, Non-stemi, Coronary artery disease, Feeding tube with poor appetite, PE now on eliquis.  PRECAUTIONS: none  SUBJECTIVE:  Pt doing well in general. Has not been to apartment gym yet due to it appearing busy at times. Pt says  his back feels a little sore from sleeping funny last night.   PAIN:  Are you having pain? No   INTERVENTION 09/22/22 -AMB overground x981ft, no device, consistent pacing, no c/o fatigue, mild intermittent drifting  -Wellzone Knee flexion level 3x15 -Wellzone knee extension level 3x12 -Wellzone Knee flexion level 4x12 -Wellzone knee extension level 3x12 -Wellzone Knee flexion level 4x12 -Wellzone knee extension level 3x12  -Wellzone triceps rope extension 7lb x12  -Wellzone LUE biceps elbow flexion 3lb 1x10 -Wellzone RUE biceps elbow flexion 7lb 1x12 -lat pulldown, level 1 x12 -row c scap retraction level 1 x12   -Wellzone triceps rope extension 7lb x12  -Wellzone LUE biceps elbow flexion 3lb 1x10 -Wellzone RUE biceps elbow flexion 7lb 1x12 -lat pulldown, level 1 x12 -row c scap retraction level 1 x12  *pt defers review of STS from bench and incline chest press     Pt educated throughout session about proper posture and technique with exercises. Improved exercise technique, movement at target joints, use of target muscles after min to mod verbal, visual, tactile cues.    PATIENT EDUCATION: Education details: Exercise technique, Instruction in gym based equipment Person educated: Patient Education method: Explanation, Demonstration, Tactile cues, and Verbal cues Education comprehension: verbalized understanding, returned demonstration, verbal cues required, tactile cues required, and needs further education   HOME EXERCISE PROGRAM: No updates   PT Short Term Goals -      PT SHORT TERM GOAL #1   Title Pt will be independent with HEP in order to improve strength and balance in order to decrease fall risk and improve function at home and work.    Baseline 02/02/2021: Patient reports limited HEP in place currently from [facility] agency 12/14 compliance; 1/20/2023Patient states no questions regarding his current exercise regimen and states he is compliant.    Time 6    Period  Weeks    Status Achieved    Target Date 03/16/21              PT Long Term Goals -       PT LONG TERM GOAL #1   Title Pt will improve FOTO to target score of 50 to display perceived improvements in ability to complete ADL's.    Baseline 02/02/2021= 43 12/143: 51%; 04/29/2021= 53%; 09/09/2021= 53; 10/12/2021=54%   Time 12    Period Weeks    Status Achieved      PT LONG TERM GOAL #2   Title Pt will decrease 5TSTS by at least 8 seconds in order to demonstrate clinically significant improvement in LE strength.    Baseline 02/02/2021= 28.22 sec with BUE Support 12/14: 23.1 seconds; 04/29/2021=Unable to test today due to patient became nauseous with 6 min walk test and attempted TUG- unable to continue- will reassess next 1-2 visits; 06/08/2021= 25.1 sec with min BUE support; 07/20/2021= 17.75 with min BUE support    Time 12    Period Weeks    Status Achieved    Target Date 07/22/21  PT LONG TERM GOAL #3   Title Pt will decrease TUG to below 19 seconds/decrease in order to demonstrate decreased fall risk.    Baseline 02/02/2021= 27.30 sec without AD 12/14: 23 seconds; 04/29/2021- Unable to assess as patient became very nauseated upon standing and request to stop treatment. 06/08/2021= 19.10 sec without an AD; 07/20/2021= 15.68 sec without UE Support    Time 12    Period Weeks    Status Achieved    Target Date 07/22/21      PT LONG TERM GOAL #4   Title Pt will increase by at least 3m (123ft) in order to demonstrate clinically significant improvement in cardiopulmonary endurance and community ambulation    Baseline 02/02/2021= 83 feet in 1 min 10 sec wihtout an AD 12/14: 200 ft in  ; 04/29/2021=Patient ambulated approx 60 feet yet had to stop due onset of nausea and unable to complete today; 07/15/2021= 330 feet in 2 min 25 sec without AD.  09/09/2021= 350 feet in 3 min 45 sec. 10/12/2021= 710 feet in 6 min- no AD (Short stand rest break)    Time 12    Period Weeks    Status Achieved    Target Date 07/22/21      PT LONG TERM GOAL #5   Title Pt will increase by at least 0.2 m/s in order to demonstrate clinically significant improvement in community ambulation.    Baseline 02/02/2021= 0.42 m/s 12/14: 0.49 m/s; 04/29/2021=Patient unable to test today secondary to being nauseated. 06/08/2021= 0.62 m/s; 07/20/2021= 0.87 m/s    Time 12    Period Weeks    Status Achieved    Target Date 07/22/21      PT LONG TERM GOAL #6   Title Pt will decrease 5TSTS by at least 3 seconds in order to demonstrate clinically significant improvement in LE strength.    Baseline 07/20/2021= 17.75 sec with Minimal BUE Support; 09/09/2021= 17.50 sec with very minimal UE Support. 10/12/2021= 14.90 sec with min BUE Support. 12/05/2021= 14.30 sec with BUE; 01/09/2022= 23.11 sec with min UE on thighs vs. Arm rests for more emphasis on BLE.  03/15/2022= 17.46 sec without UE Support. 04/11/2022= 15.22 sec without UE support; 08/18/2022= 13.69 sec without UE Support   Time 12    Period Weeks    Status MET   Target Date 07/05/2022   PT LONG TERM GOAL #7 Title:  Pt will increase by at least 66m (148ft) in order to demonstrate clinically significant improvement in cardiopulmonary endurance and community ambulation                                                       Baseline: 10/12/2021= 710 feet; 12/05/2021= 827 feet; 01/09/2022= 620 feet without an AD - stopping at 4 min mark due to self reported fatigue. 03/15/2022= 1040 feet; 04/13/2022= Patient too fatigued to attempt after other testing. Will assess next visit. 06/07/2022= 750 feet in 4 min; 06/26/2022= 1350 feet  Goal status: MET Target date: 07/05/2022        PT LONG TERM GOAL #8  Title Patient will demonstrate an improved Berg Balance Score of > 50/56 as to demonstrate improved balance with ADLs such as sitting/standing and transfer balance and reduced fall risk.  Baseline 01/09/2022= 47/56; 04/12/2022= 53/56  Time 12   Period  Weeks   Status Met  Target Date  04/03/2022   PT LONG TERM GOAL #9  Title Pt will increase BUE shoulder/elbow strength of  by at least 1/2 MMT grade in order to demonstrate improvement in strength and function    Baseline 04/13/2022= BUE = 4/5 throughtout ; 08/18/18- mostly 5/5 from elbow down and limited ROM with shoulder but 4/5 in available range  Time 12   Period Weeks   Status Partially met  Target Date 07/05/2022   PT LONG TERM GOAL #10  Title Pt will transition from skilled PT interventions to gym based exercise program to demonstrate independence and continuation of performing exercises.    Baseline 08/18/2022- Patient has not attempted gym exercises  Time 12   Period Weeks   Status NEW  Target Date 11/10/2022       Plan -      Clinical Impression Statement Continued with education on transition to gym at DC. Pt is getting better with pacing self, but could still benefits from longer recovery times between sets. Discussed safe progression of loading as he progresses in strength. Pt still needs help with adjustment pins on machines and clipping/unclipping attachments. Some attempts to correct form are met with excessive fatigue in adjacent segments. Will continue to benefit from additional orientation/education prior to DC.    Personal Factors and Comorbidities Comorbidity 3+    Comorbidities DM, Liver transplant, ESRD, CAD, feeding tube    Examination-Activity Limitations Carry;Lift;Reach Overhead;Squat;Stairs;Stand;Transfers;Toileting    Examination-Participation Restrictions Cleaning;Community Activity;Driving;Medication Management;Meal Prep;Yard Work    Conservation officer, historic buildings Evolving/Moderate complexity    Rehab Potential Fair    PT Frequency 1x / week    PT Duration 12 weeks    PT Treatment/Interventions ADLs/Self Care Home Management;Cryotherapy;Moist Heat;DME Instruction;Gait training;Stair training;Functional mobility training;Therapeutic activities;Therapeutic exercise;Balance  training;Neuromuscular re-education;Patient/family education;Wheelchair mobility training;Manual techniques;Passive range of motion;Dry needling;Energy conservation;Joint Manipulations    PT Next Visit Plan Progressive Therapeutic exercises in Well zone    PT Home Exercise Plan No changes or updates today.    Consulted and Agree with Plan of Care Patient            10:18 AM, 09/22/22 Rosamaria Lints, PT, DPT Physical Therapist - [facility]  Outpatient Physical Therapy- Main Campus [phone]       09/22/2022, 10:18 AM

## 2022-09-27 ENCOUNTER — Ambulatory Visit: Payer: Medicare Other

## 2022-09-29 ENCOUNTER — Ambulatory Visit: Payer: Medicare Other

## 2022-10-04 ENCOUNTER — Ambulatory Visit: Payer: Medicare Other

## 2022-10-04 DIAGNOSIS — M6281 Muscle weakness (generalized): Secondary | ICD-10-CM

## 2022-10-04 DIAGNOSIS — R262 Difficulty in walking, not elsewhere classified: Secondary | ICD-10-CM | POA: Diagnosis not present

## 2022-10-04 DIAGNOSIS — R2681 Unsteadiness on feet: Secondary | ICD-10-CM

## 2022-10-04 DIAGNOSIS — R2689 Other abnormalities of gait and mobility: Secondary | ICD-10-CM

## 2022-10-04 DIAGNOSIS — R269 Unspecified abnormalities of gait and mobility: Secondary | ICD-10-CM

## 2022-10-04 DIAGNOSIS — R278 Other lack of coordination: Secondary | ICD-10-CM

## 2022-10-04 NOTE — Therapy (Signed)
OUTPATIENT PHYSICAL THERAPY TREATMENT    Patient Name: Alex Hampton MRN: [id number] DOB:Nov 07, 1955, 67 y.o., male Today's Date: 10/05/2022  PCP: Dr. Dorothey Baseman REFERRING PROVIDER: Azucena Cecil, MD   PT End of Session - 10/04/22 0925     Visit Number 107    Number of Visits 112    Date for PT Re-Evaluation 11/10/22    Authorization Type Medicare/BCBS    Progress Note Due on Visit 110    PT Start Time 0930    PT Stop Time 1000    PT Time Calculation (min) 30 min    Equipment Utilized During Treatment Gait belt    Activity Tolerance Patient tolerated treatment well    Behavior During Therapy WFL for tasks assessed/performed              Past Medical History:  Diagnosis Date   Depression    History of cardiac cath    History of heart artery stent    Hyperlipidemia    Hypertension    MI (myocardial infarction) (HCC)    Renal disorder    Past Surgical History:  Procedure Laterality Date   IR GJ TUBE CHANGE  11/22/2020   IR GJ TUBE CHANGE  11/26/2020   IR REPLC GASTRO/COLONIC TUBE PERCUT W/FLUORO  10/14/2020   LEFT HEART CATH AND CORONARY ANGIOGRAPHY N/A 08/19/2020   Procedure: LEFT HEART CATH AND CORONARY ANGIOGRAPHY;  Surgeon: Lamar Blinks, MD;  Location: [facility] INVASIVE CV LAB;  Service: Cardiovascular;  Laterality: N/A;   LIVER TRANSPLANT     Patient Active Problem List   Diagnosis Date Noted   Pulmonary embolism (HCC) 08/08/2021   CAP (community acquired pneumonia) 08/08/2021   Protein calorie malnutrition (HCC) 08/08/2021   Hypokalemia 08/08/2021   Pancreatic cyst    Verbal auditory hallucination    Early satiety    Feeding tube dysfunction    Generalized weakness 11/26/2020   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HCC) 11/26/2020   Recurrent falls 11/26/2020   Syncope 10/15/2020   Uremia 10/10/2020   ESRD on hemodialysis (HCC) 10/10/2020   Hyperkalemia 10/10/2020   Elevated troponin 10/10/2020   GERD (gastroesophageal reflux disease)  10/10/2020   CAD (coronary artery disease) 10/10/2020   G tube feedings (HCC) 10/10/2020   Diarrhea 10/10/2020   Diabetes mellitus (HCC) 10/10/2020   Weakness    ACS (acute coronary syndrome) (HCC)    Liver transplant recipient [facility])    Anemia of chronic disease    Type 2 diabetes mellitus with diabetic neuropathy, with long-term current use of insulin (HCC)    ESRD (end stage renal disease) (HCC) 08/19/2020   History of anemia due to chronic kidney disease 08/19/2020   Gait abnormality 10/17/2019   History of fall within past 90 days 10/17/2019   Visual hallucination 10/17/2019   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 10/17/2019   HTN (hypertension) 10/17/2019   History of MI (myocardial infarction) 10/17/2019   OSA (obstructive sleep apnea) 10/17/2019   Nondisplaced comminuted supracondylar fracture without intercondylar fracture of left humerus, subsequent encounter for fracture with nonunion 10/17/2019   Anxiety and depression 10/17/2019    REFERRING DIAG: Other malaise Z94.4 (ICD-10-CM) - Liver transplant status R26.9 (ICD-10-CM) - Unspecified abnormalities of gait and mobility   THERAPY DIAG:  Difficulty in walking, not elsewhere classified  Muscle weakness (generalized)  Other abnormalities of gait and mobility  Abnormality of gait and mobility  Other lack of coordination  Unsteadiness on feet  Rationale for Evaluation and Treatment Rehabilitation  PERTINENT HISTORY: Patient is a 67 year old male with recent referral for abnomality of gait and recent liver transplant in November 2021. He has past medical history significant for Liver transplant (02/18/2020), CKD, End stage renal disease- On hemodialyis T/TH/Sat, Anemia, DM with neuropathy, Non-stemi, Coronary artery disease, Feeding tube with poor appetite, PE now on eliquis.  PRECAUTIONS: none  SUBJECTIVE:  Patient reports doing well and states he feels he is ready to continue with gym/home program on his  own. States he has come a long way and doing quite well despite his medical issues.   PAIN:  Are you having pain? No    TODAY'S TREATMENT:   10/04/2022  INTERVENTION    -Wellzone Knee flexion level 2 x 12 and level 3 x 12 x 2 sets -Wellzone knee extension level x12 and level 4 x 12 x 2 sets -Wellzone leg press- Level 4 x12 and level 5 x 12 x 2 sets  -Wellzone triceps press down level 3- 3 sets  x12  -Wellzone BUE biceps elbow flexion cable level 2 1x10 - -lat pulldown, level 1 x12 -row c scap retraction level 2 x10    *Reviewed body weight activities in case gym does not have machines including standing calf raises, air squats, Hip abd/Add (standing)   Pt educated throughout session about proper posture and technique with exercises. Improved exercise technique, movement at target joints, use of target muscles after min to mod verbal, visual, tactile cues.    PATIENT EDUCATION: Education details: Exercise technique, Instruction in gym based equipment Person educated: Patient Education method: Explanation, Demonstration, Tactile cues, and Verbal cues Education comprehension: verbalized understanding, returned demonstration, verbal cues required, tactile cues required, and needs further education   HOME EXERCISE PROGRAM: No updates   PT Short Term Goals -      PT SHORT TERM GOAL #1   Title Pt will be independent with HEP in order to improve strength and balance in order to decrease fall risk and improve function at home and work.    Baseline 02/02/2021: Patient reports limited HEP in place currently from [facility] agency 12/14 compliance; 1/20/2023Patient states no questions regarding his current exercise regimen and states he is compliant.    Time 6    Period Weeks    Status Achieved    Target Date 03/16/21              PT Long Term Goals -       PT LONG TERM GOAL #1   Title Pt will improve FOTO to target score of 50 to display perceived improvements in ability to  complete ADL's.    Baseline 02/02/2021= 43 12/143: 51%; 04/29/2021= 53%; 09/09/2021= 53; 10/12/2021=54%   Time 12    Period Weeks    Status Achieved      PT LONG TERM GOAL #2   Title Pt will decrease 5TSTS by at least 8 seconds in order to demonstrate clinically significant improvement in LE strength.    Baseline 02/02/2021= 28.22 sec with BUE Support 12/14: 23.1 seconds; 04/29/2021=Unable to test today due to patient became nauseous with 6 min walk test and attempted TUG- unable to continue- will reassess next 1-2 visits; 06/08/2021= 25.1 sec with min BUE support; 07/20/2021= 17.75 with min BUE support    Time 12    Period Weeks    Status Achieved    Target Date 07/22/21      PT LONG TERM GOAL #3   Title Pt will decrease TUG to below  19 seconds/decrease in order to demonstrate decreased fall risk.    Baseline 02/02/2021= 27.30 sec without AD 12/14: 23 seconds; 04/29/2021- Unable to assess as patient became very nauseated upon standing and request to stop treatment. 06/08/2021= 19.10 sec without an AD; 07/20/2021= 15.68 sec without UE Support    Time 12    Period Weeks    Status Achieved    Target Date 07/22/21      PT LONG TERM GOAL #4   Title Pt will increase by at least 30m (165ft) in order to demonstrate clinically significant improvement in cardiopulmonary endurance and community ambulation    Baseline 02/02/2021= 83 feet in 1 min 10 sec wihtout an AD 12/14: 200 ft in  ; 04/29/2021=Patient ambulated approx 60 feet yet had to stop due onset of nausea and unable to complete today; 07/15/2021= 330 feet in 2 min 25 sec without AD.  09/09/2021= 350 feet in 3 min 45 sec. 10/12/2021= 710 feet in 6 min- no AD (Short stand rest break)    Time 12    Period Weeks    Status Achieved   Target Date 07/22/21      PT LONG TERM GOAL #5   Title Pt will increase by at least 0.2 m/s in order to demonstrate clinically significant improvement in community ambulation.    Baseline 02/02/2021= 0.42 m/s  12/14: 0.49 m/s; 04/29/2021=Patient unable to test today secondary to being nauseated. 06/08/2021= 0.62 m/s; 07/20/2021= 0.87 m/s    Time 12    Period Weeks    Status Achieved    Target Date 07/22/21      PT LONG TERM GOAL #6   Title Pt will decrease 5TSTS by at least 3 seconds in order to demonstrate clinically significant improvement in LE strength.    Baseline 07/20/2021= 17.75 sec with Minimal BUE Support; 09/09/2021= 17.50 sec with very minimal UE Support. 10/12/2021= 14.90 sec with min BUE Support. 12/05/2021= 14.30 sec with BUE; 01/09/2022= 23.11 sec with min UE on thighs vs. Arm rests for more emphasis on BLE.  03/15/2022= 17.46 sec without UE Support. 04/11/2022= 15.22 sec without UE support; 08/18/2022= 13.69 sec without UE Support   Time 12    Period Weeks    Status MET   Target Date 07/05/2022   PT LONG TERM GOAL #7 Title:  Pt will increase by at least 71m (134ft) in order to demonstrate clinically significant improvement in cardiopulmonary endurance and community ambulation                                                       Baseline: 10/12/2021= 710 feet; 12/05/2021= 827 feet; 01/09/2022= 620 feet without an AD - stopping at 4 min mark due to self reported fatigue. 03/15/2022= 1040 feet; 04/13/2022= Patient too fatigued to attempt after other testing. Will assess next visit. 06/07/2022= 750 feet in 4 min; 06/26/2022= 1350 feet  Goal status: MET Target date: 07/05/2022        PT LONG TERM GOAL #8  Title Patient will demonstrate an improved Berg Balance Score of > 50/56 as to demonstrate improved balance with ADLs such as sitting/standing and transfer balance and reduced fall risk.  Baseline 01/09/2022= 47/56; 04/12/2022= 53/56  Time 12   Period Weeks   Status Met  Target Date 04/03/2022   PT LONG TERM  GOAL #9  Title Pt will increase BUE shoulder/elbow strength of  by at least 1/2 MMT grade in order to demonstrate improvement in strength and function    Baseline 04/13/2022= BUE = 4/5 throughtout  ; 08/18/18- mostly 5/5 from elbow down and limited ROM with shoulder but 4/5 in available range  Time 12   Period Weeks   Status Partially met  Target Date 07/05/2022   PT LONG TERM GOAL #10  Title Pt will transition from skilled PT interventions to gym based exercise program to demonstrate independence and continuation of performing exercises.    Baseline 08/18/2022- Patient has not attempted gym exercises; 10/04/2022= Patient demo independence with knowledge of gym/home based HEP and will be to continue on his own.   Time 12   Period Weeks   Status MET  Target Date 11/10/2022       Plan -      Clinical Impression Statement Patient returns for last scheduled PT visit. Reviewed frequency/duration and general safety. Also reviewed RPE scale and importance of listening to his body and modify exercises including altering weight/frequency as necessary. He has met his last remaining goal and has vastly improved since his initial PT visit including now driving, totally independent in home and community and mostly much improved overall strength and functional endurance with everyday activities. He has met his goals and no longer requires skilled PT services.    Personal Factors and Comorbidities Comorbidity 3+    Comorbidities DM, Liver transplant, ESRD, CAD, feeding tube    Examination-Activity Limitations Carry;Lift;Reach Overhead;Squat;Stairs;Stand;Transfers;Toileting    Examination-Participation Restrictions Cleaning;Community Activity;Driving;Medication Management;Meal Prep;Yard Work    Conservation officer, historic buildings Evolving/Moderate complexity    Rehab Potential Fair    PT Frequency 1x / week    PT Duration 12 weeks    PT Treatment/Interventions ADLs/Self Care Home Management;Cryotherapy;Moist Heat;DME Instruction;Gait training;Stair training;Functional mobility training;Therapeutic activities;Therapeutic exercise;Balance training;Neuromuscular re-education;Patient/family  education;Wheelchair mobility training;Manual techniques;Passive range of motion;Dry needling;Energy conservation;Joint Manipulations    PT Next Visit Plan Discharge to HEP today.    PT Home Exercise Plan No changes or updates today.    Consulted and Agree with Plan of Care Patient            8:15 AM, 10/05/22 Louis Meckel, PT Physical Therapist - [facility]  Outpatient Physical Therapy- Main Campus [phone]       10/05/2022, 8:15 AM

## 2022-10-11 ENCOUNTER — Ambulatory Visit: Payer: Medicare Other

## 2022-10-18 ENCOUNTER — Ambulatory Visit: Payer: Medicare Other

## 2022-10-25 ENCOUNTER — Ambulatory Visit: Payer: Medicare Other | Admitting: Dermatology

## 2022-10-27 ENCOUNTER — Ambulatory Visit: Payer: BLUE CROSS/BLUE SHIELD

## 2022-11-01 ENCOUNTER — Ambulatory Visit: Payer: BLUE CROSS/BLUE SHIELD

## 2022-11-06 ENCOUNTER — Ambulatory Visit: Payer: BLUE CROSS/BLUE SHIELD

## 2022-11-07 ENCOUNTER — Encounter: Payer: Self-pay | Admitting: Emergency Medicine

## 2022-11-07 ENCOUNTER — Emergency Department
Admission: EM | Admit: 2022-11-07 | Discharge: 2022-11-07 | Disposition: A | Discharge status: Home / Self Care | Payer: Medicare Other | Attending: Emergency Medicine | Admitting: Emergency Medicine

## 2022-11-07 ENCOUNTER — Emergency Department: Payer: Medicare Other

## 2022-11-07 ENCOUNTER — Other Ambulatory Visit: Payer: Self-pay

## 2022-11-07 DIAGNOSIS — N186 End stage renal disease: Secondary | ICD-10-CM | POA: Diagnosis not present

## 2022-11-07 DIAGNOSIS — Z992 Dependence on renal dialysis: Secondary | ICD-10-CM | POA: Diagnosis not present

## 2022-11-07 DIAGNOSIS — R001 Bradycardia, unspecified: Secondary | ICD-10-CM | POA: Insufficient documentation

## 2022-11-07 DIAGNOSIS — R0600 Dyspnea, unspecified: Secondary | ICD-10-CM | POA: Insufficient documentation

## 2022-11-07 DIAGNOSIS — Z1152 Encounter for screening for COVID-19: Secondary | ICD-10-CM | POA: Insufficient documentation

## 2022-11-07 DIAGNOSIS — R0602 Shortness of breath: Secondary | ICD-10-CM | POA: Diagnosis present

## 2022-11-07 LAB — COMPREHENSIVE METABOLIC PANEL
ALT: 9 U/L (ref 0–44)
AST: 13 U/L — ABNORMAL LOW (ref 15–41)
Albumin: 3.7 g/dL (ref 3.5–5.0)
Alkaline Phosphatase: 118 U/L (ref 38–126)
Anion gap: 9 (ref 5–15)
BUN: 22 mg/dL (ref 8–23)
CO2: 24 mmol/L (ref 22–32)
Calcium: 8.3 mg/dL — ABNORMAL LOW (ref 8.9–10.3)
Chloride: 103 mmol/L (ref 98–111)
Creatinine, Ser: 3.2 mg/dL — ABNORMAL HIGH (ref 0.61–1.24)
GFR, Estimated: 20 mL/min — ABNORMAL LOW (ref >60)
Glucose, Bld: 116 mg/dL — ABNORMAL HIGH (ref 70–99)
Potassium: 3.8 mmol/L (ref 3.5–5.1)
Sodium: 136 mmol/L (ref 135–145)
Total Bilirubin: 0.7 mg/dL (ref 0.3–1.2)
Total Protein: 6.8 g/dL (ref 6.5–8.1)

## 2022-11-07 LAB — BRAIN NATRIURETIC PEPTIDE: B Natriuretic Peptide: 812.4 pg/mL — ABNORMAL HIGH (ref 0.0–100.0)

## 2022-11-07 LAB — TROPONIN I (HIGH SENSITIVITY)
Troponin I (High Sensitivity): 25 ng/L — ABNORMAL HIGH (ref <18)
Troponin I (High Sensitivity): 26 ng/L — ABNORMAL HIGH (ref <18)

## 2022-11-07 LAB — CBC
HCT: 35 % — ABNORMAL LOW (ref 39.0–52.0)
Hemoglobin: 11.4 g/dL — ABNORMAL LOW (ref 13.0–17.0)
MCH: 29.5 pg (ref 26.0–34.0)
MCHC: 32.6 g/dL (ref 30.0–36.0)
MCV: 90.4 fL (ref 80.0–100.0)
Platelets: 194 10*3/uL (ref 150–400)
RBC: 3.87 MIL/uL — ABNORMAL LOW (ref 4.22–5.81)
RDW: 13.4 % (ref 11.5–15.5)
WBC: 7 10*3/uL (ref 4.0–10.5)
nRBC: 0 % (ref 0.0–0.2)

## 2022-11-07 LAB — SARS CORONAVIRUS 2 BY RT PCR: SARS Coronavirus 2 by RT PCR: NEGATIVE

## 2022-11-07 NOTE — ED Triage Notes (Signed)
Patient ambulatory to triage with steady gait, without difficulty or distress noted; pt repoirts SHOB x 2wks that increased tonight; recent sinus drainage and nonprod cough; hx liver transplant

## 2022-11-07 NOTE — ED Notes (Signed)
Pt hooked up to cardiac monitor. Call light within reach.

## 2022-11-07 NOTE — ED Provider Notes (Signed)
[facility] Provider Note    Event Date/Time   First MD Initiated Contact with Patient 11/07/22 0159     (approximate)   History   Shortness of Breath   HPI Alex Hampton is a 67 y.o. male  Whose medical history includes liver transplant, chronic renal failure on hemodialysis and a prior history of small blood clots requiring a short period of anticoagulation.  He currently takes Plavix.  He presents tonight for shortness of breath.  He has had several episodes of shortness of breath over the last couple of weeks, and it tends to happen at night when he is using his CPAP.  He feels like he cannot catch his breath and has to take off the CPAP.  He denies shortness of breath being worse with ambulation.  He said he has had increased sinus drainage recently and a nonproductive cough.  Despite this and his complicated medical history, he has otherwise been doing okay recently.  No chest pain, no nausea or vomiting, no abdominal pain.  No fever or chills.      Physical Exam   Triage Vital Signs: ED Triage Vitals  Encounter Vitals Group     BP 11/07/22 0130 118/63     Systolic BP Percentile --      Diastolic BP Percentile --      Pulse Rate 11/07/22 0130 (!) 57     Resp 11/07/22 0130 20     Temp 11/07/22 0130 97.7 F (36.5 C)     Temp src --      SpO2 11/07/22 0130 (!) 88 %     Weight 11/07/22 0136 85 kg (187 lb 6.3 oz)     Height 11/07/22 0136 1.854 m (6\' 1" )     Head Circumference --      Peak Flow --      Pain Score 11/07/22 0137 0     Pain Loc --      Pain Education --      Exclude from Growth Chart --     Most recent vital signs: Vitals:   11/07/22 0530 11/07/22 0725  BP: (!) 180/82   Pulse: (!) 55   Resp: (!) 21   Temp:  97.7 F (36.5 C)  SpO2: 93%     General: Awake, no distress.  Appears somewhat chronically ill but currently awake, alert, pleasant, and conversant. CV:  Good peripheral perfusion.  Normal heart sounds, regular rate  and rhythm Resp:  Normal effort. Speaking easily and comfortably, no accessory muscle usage nor intercostal retractions.  Lungs clear to auscultation bilaterally. Abd:  No distention.  Other:  Patient is somewhat pale but this is apparently his baseline.  Patient is in good spirits and denying any current symptoms.   ED Results / Procedures / Treatments   Labs (all labs ordered are listed, but only abnormal results are displayed) Labs Reviewed  CBC - Abnormal; Notable for the following components:      Result Value   RBC 3.87 (*)    Hemoglobin 11.4 (*)    HCT 35.0 (*)    All other components within normal limits  COMPREHENSIVE METABOLIC PANEL - Abnormal; Notable for the following components:   Glucose, Bld 116 (*)    Creatinine, Ser 3.20 (*)    Calcium 8.3 (*)    AST 13 (*)    GFR, Estimated 20 (*)    All other components within normal limits  BRAIN NATRIURETIC PEPTIDE - Abnormal; Notable for  the following components:   B Natriuretic Peptide 812.4 (*)    All other components within normal limits  TROPONIN I (HIGH SENSITIVITY) - Abnormal; Notable for the following components:   Troponin I (High Sensitivity) 25 (*)    All other components within normal limits  TROPONIN I (HIGH SENSITIVITY) - Abnormal; Notable for the following components:   Troponin I (High Sensitivity) 26 (*)    All other components within normal limits  SARS CORONAVIRUS 2 BY RT PCR     EKG  ED ECG REPORT I, Loleta Rose, the attending physician, personally viewed and interpreted this ECG.  Date: 11/07/2022 EKG Time: 01:46 Rate: 56 Rhythm: sinus bradycardia QRS Axis: normal Intervals: normal ST/T Wave abnormalities: normal Narrative Interpretation: no evidence of acute ischemia    RADIOLOGY I viewed and interpreted the patient's two-view chest x-ray.  I see no evidence of pneumonia.  The radiologist mentioned a possible area of loculated pleural effusion very small.   PROCEDURES:  Critical  Care performed: No  .1-3 Lead EKG Interpretation  Performed by: Loleta Rose, MD Authorized by: Loleta Rose, MD     Interpretation: abnormal     ECG rate:  55   ECG rate assessment: bradycardic     Rhythm: sinus bradycardia     Ectopy: none     Conduction: normal       IMPRESSION / MDM / ASSESSMENT AND PLAN / ED COURSE  I reviewed the triage vital signs and the nursing notes.                              Differential diagnosis includes, but is not limited to, pneumonia, pulmonary edema, PE, pneumothorax.  Patient's presentation is most consistent with acute presentation with potential threat to life or bodily function.  Labs/studies ordered: 2 view chest x-ray, EKG, CBC, CMP, BNP, high-sensitivity troponin x 2, COVID PCR  Interventions/Medications given:  Medications - No data to display  (Note:  hospital course my include additional interventions and/or labs/studies not listed above.)   Patient in no distress now but was slightly tachypneic and initially had some hypoxia at about 88%.  However this resolved quickly and I did not personally see it because he was satting in the mid upper 90s by the time I assessed him.  Evaluated broadly and his workup was generally reassuring.  He had an elevated BNP but with no evidence of pulmonary edema.  Metabolic panel and CBC are at baseline.  Chest x-ray with possible small trace loculated pleural effusion but without any consolidations or fluid.  Given his complicated history as well as a history of prior small PEs, I discussed it with him and his son and we will proceed with CTA chest to rule out pulmonary embolism despite his overall well appearance at this time.  The patient is on the cardiac monitor to evaluate for evidence of arrhythmia and/or significant heart rate changes.   Clinical Course as of 11/07/22 0751  Tue Nov 07, 2022  0705 After repeated attempts, we have been unable to establish an IV sufficient for CTA  chest.  However I reassessed the patient and talked with him again.  He has felt fine for nearly 6 hours in the emergency department and would like to go home.  He has not been hypoxic, no tachycardia, no increased respiratory effort.  I spoke with him and his son at length about the risks and benefits of staying for  additional evaluation including a CTA chest but he understands that he may have an undiagnosed PE and is comfortable with discharge and outpatient follow-up.  He will return should he develop new or worsening symptoms.  I think that is very reasonable and appropriate.  I gave strict return precautions. [CF]    Clinical Course User Index [CF] Loleta Rose, MD     FINAL CLINICAL IMPRESSION(S) / ED DIAGNOSES   Final diagnoses:  Dyspnea, unspecified type     Rx / DC Orders   ED Discharge Orders     None        Note:  This document was prepared using Dragon voice recognition software and may include unintentional dictation errors.   Loleta Rose, MD 11/07/22 [phone]

## 2022-11-07 NOTE — Discharge Instructions (Signed)
As we discussed, your evaluation was generally reassuring, although we were unable to obtain a CTA chest to rule out pulmonary embolism at this time due to difficulties and delays obtaining an appropriate peripheral IV.  You decided that you would rather leave and follow-up as an outpatient since you are feeling better and that is reasonable, but please return immediately to the emergency department should you develop new or worsening symptoms.

## 2022-11-13 ENCOUNTER — Ambulatory Visit: Payer: BLUE CROSS/BLUE SHIELD

## 2022-11-13 ENCOUNTER — Ambulatory Visit: Payer: Medicare Other | Admitting: Dermatology

## 2022-11-20 ENCOUNTER — Ambulatory Visit: Payer: BLUE CROSS/BLUE SHIELD

## 2022-11-20 IMAGING — CR DG CHEST 2V
1 series · 2 of 2 positions shown · non-contrast
Comparison: Previous studies including the examination of
03/08/2021

CLINICAL DATA: Chest pain

EXAM:
CHEST - 2 VIEW

[Series 1: dg chest 2 view · 0.14mm/px · 2 of 2 slices shown]
[im 1/2]
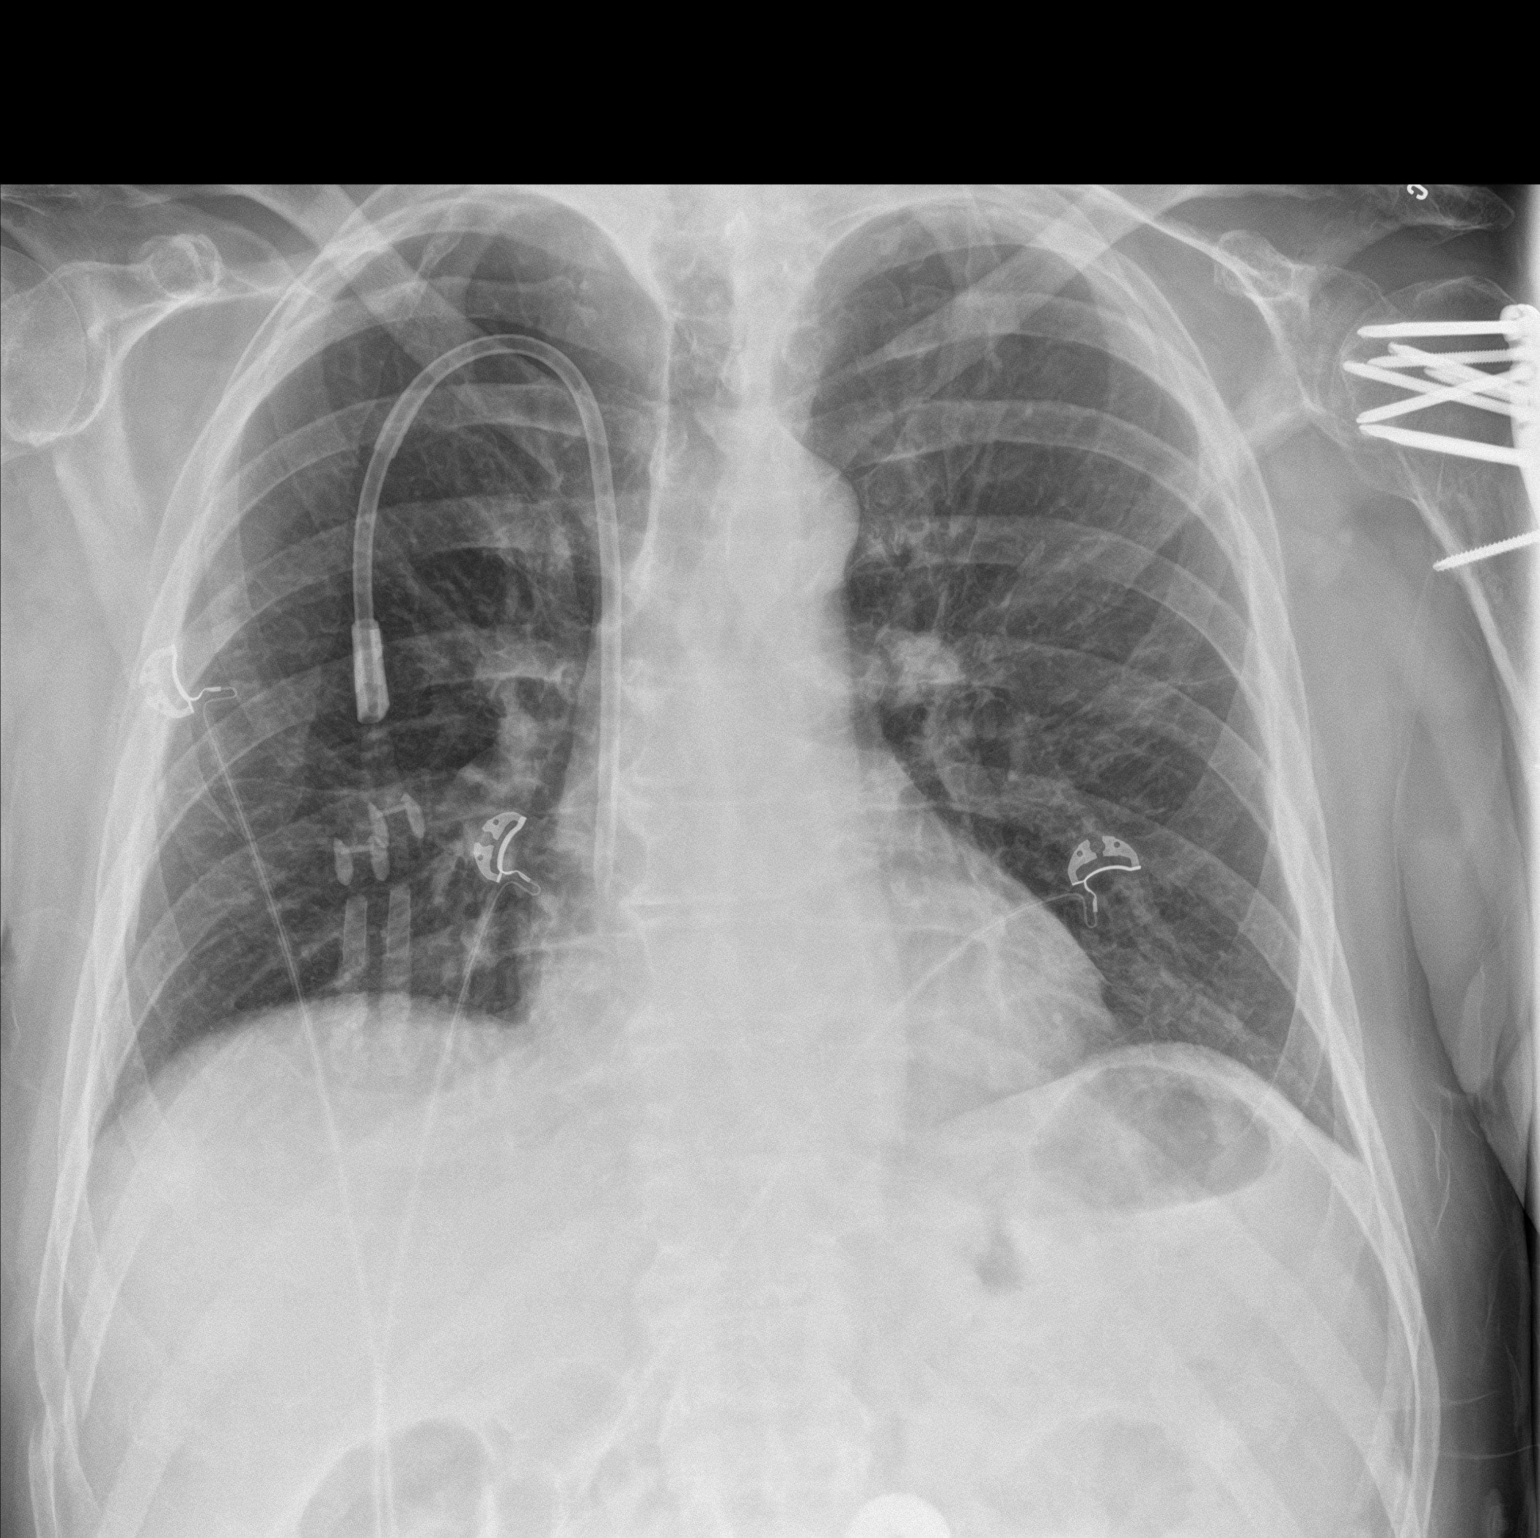
[im 2/2]
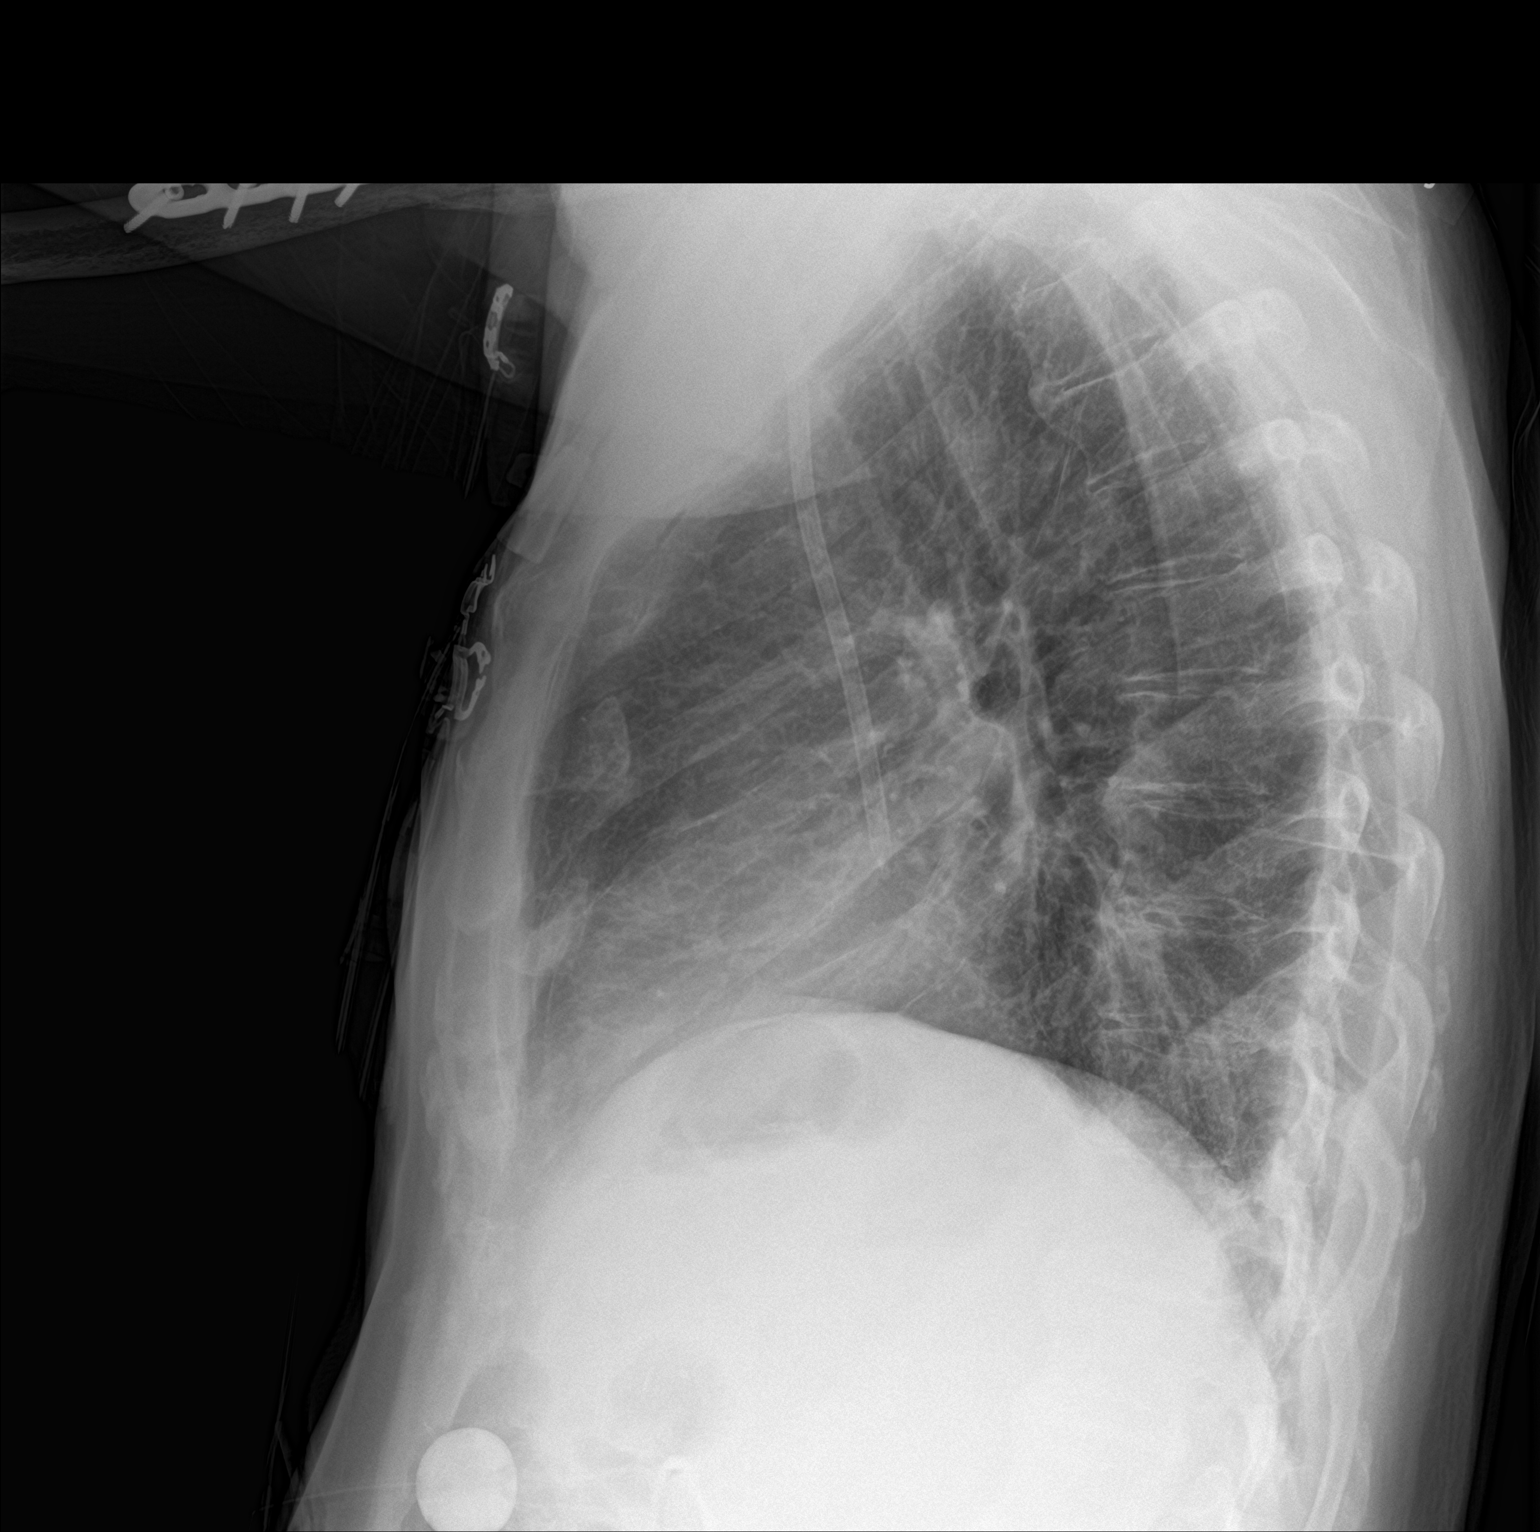

[2 of 2 positions shown; findings below may reference images not displayed]

FINDINGS: Cardiac size is within normal limits. There are no signs of
pulmonary edema or new focal infiltrates. Small linear density in
the left mid lung fields may suggest scarring. Tip of right IJ
central venous catheter is seen in the superior vena cava. There is
no pleural effusion or pneumothorax. There is previous internal
fixation in the left humerus.
IMPRESSION: No active cardiopulmonary disease.

## 2022-11-21 IMAGING — CT CT ANGIO CHEST
2 of 7 series · 18 of 46 positions shown · IV contrast (APPLIED)
Comparison: 03/08/2021.

CLINICAL DATA: Left lower back pain, weakness, liver transplant,
chest pain.

EXAM:
CT ANGIOGRAPHY CHEST WITH CONTRAST
TECHNIQUE: Multidetector CT imaging of the chest was performed using the
standard protocol during bolus administration of intravenous
contrast. Multiplanar CT image reconstructions and MIPs were
obtained to evaluate the vascular anatomy.

[Series 6: thins · axial · 0.68mm/px · z∈[+1276,+1532]mm · 16 of 412 slices shown]
[im 23/412  lung]
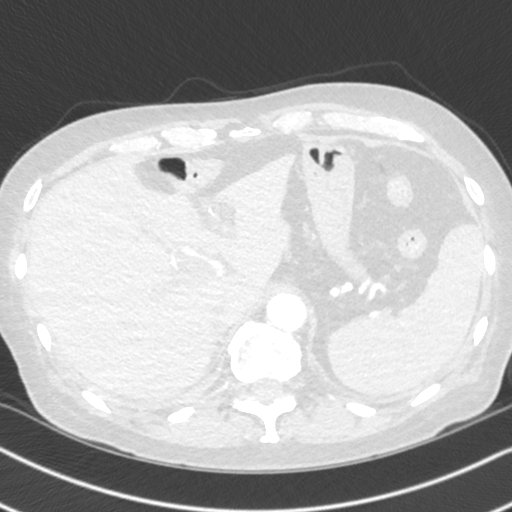
[im 46/412  soft-tissue]
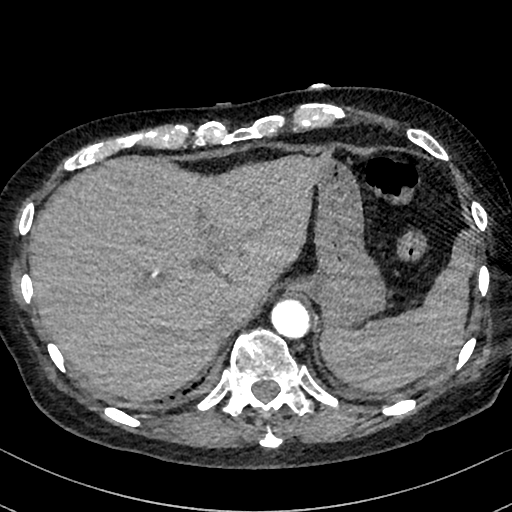
[im 69/412  lung]
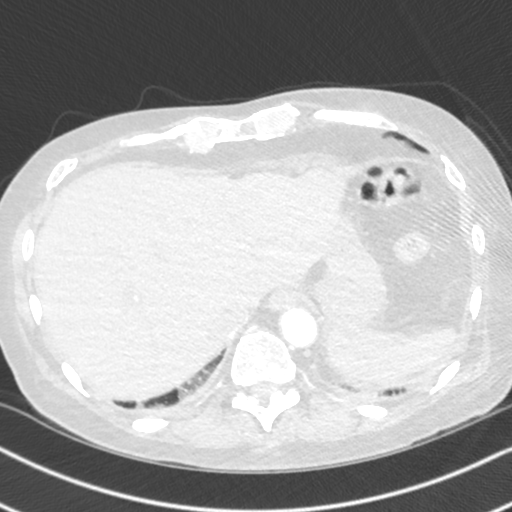
[im 92/412  soft-tissue]
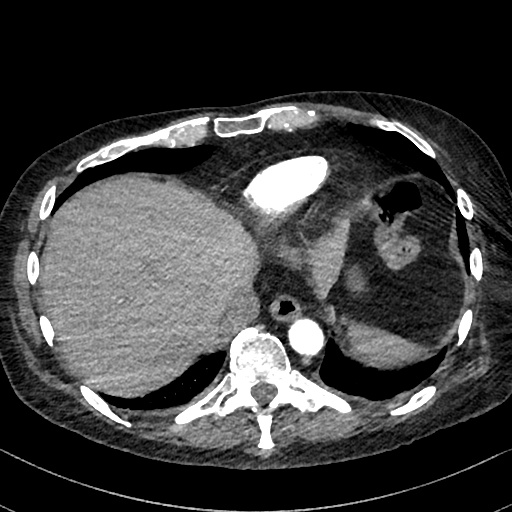
[im 115/412  lung]
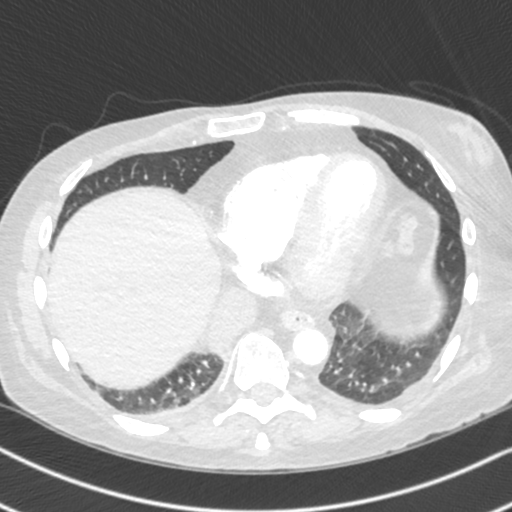
[im 138/412  soft-tissue]
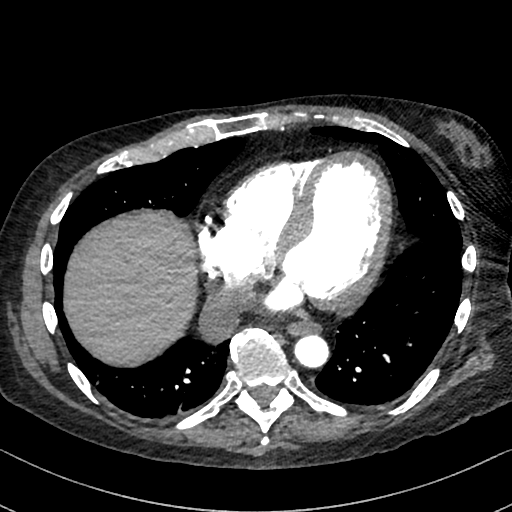
[im 160/412  lung]
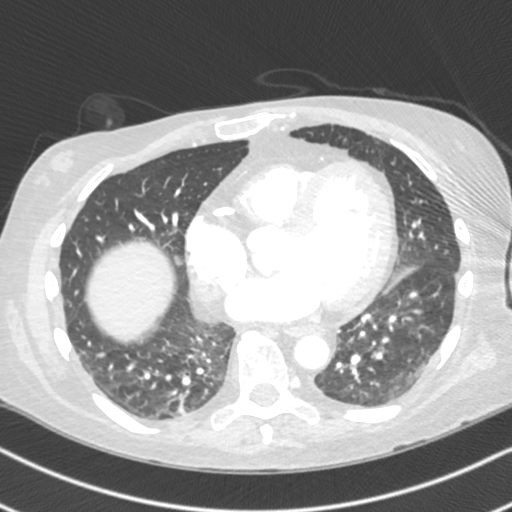
[im 183/412  soft-tissue]
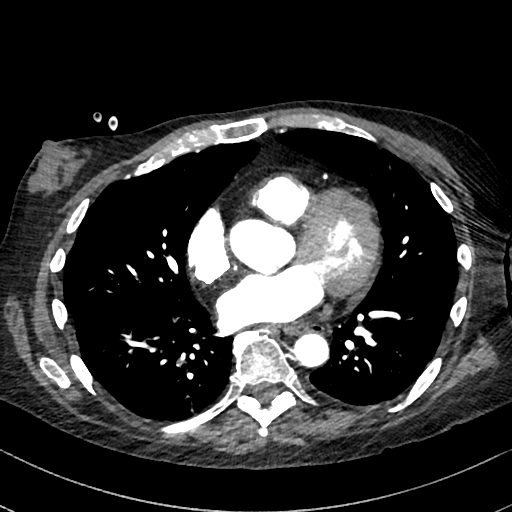
[im 229/412  lung]
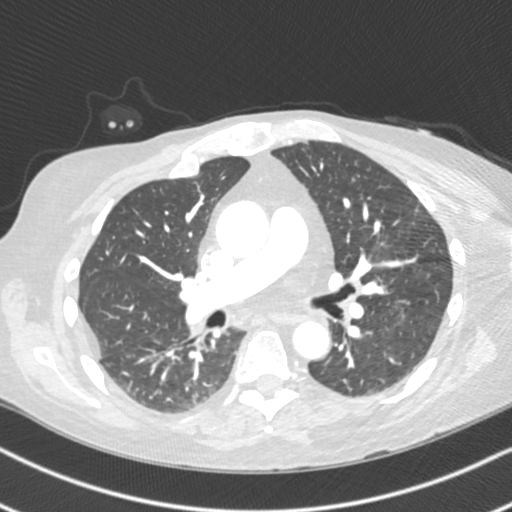
[im 252/412  soft-tissue]
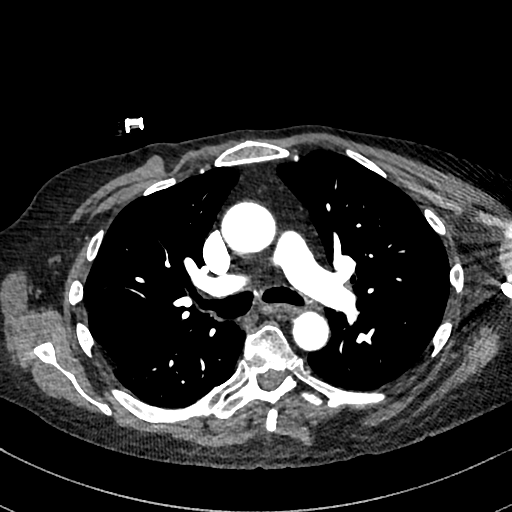
[im 275/412  lung]
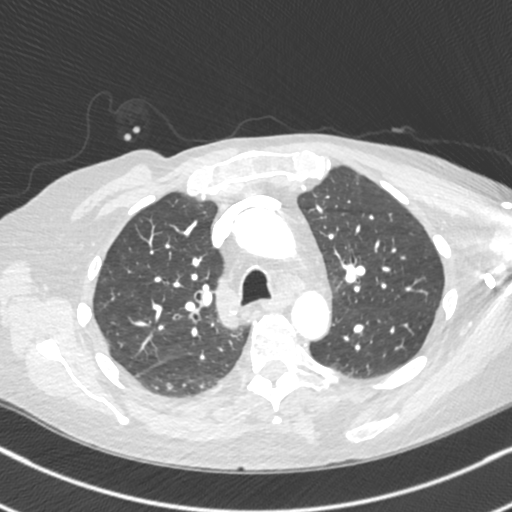
[im 297/412  soft-tissue]
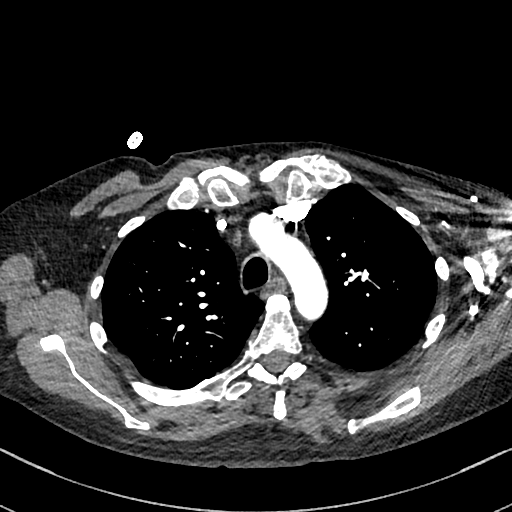
[im 320/412  lung]
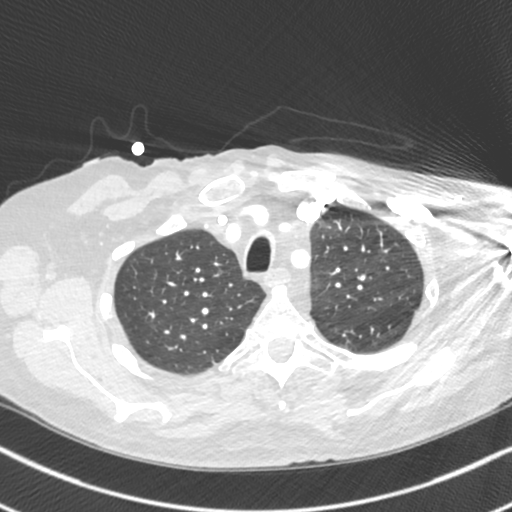
[im 343/412  soft-tissue]
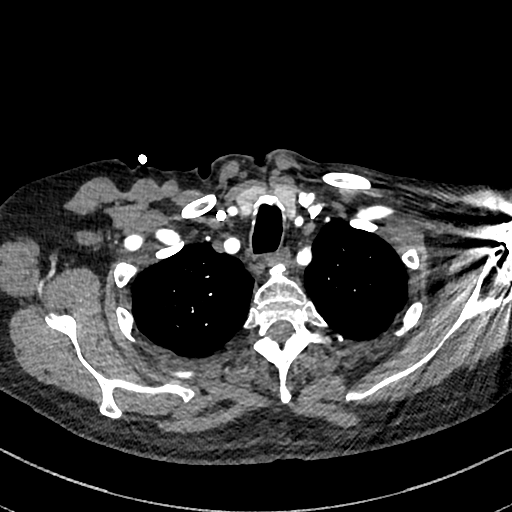
[im 366/412  lung]
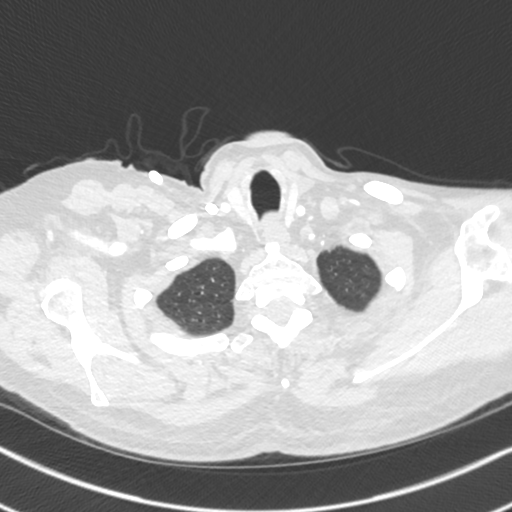
[im 389/412  soft-tissue]
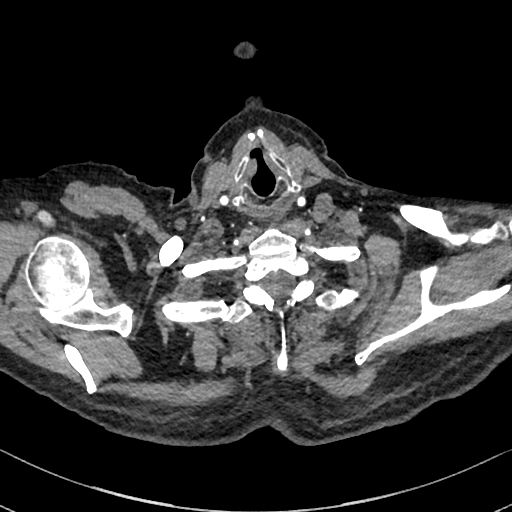

[Series 7: cor · coronal · 0.56mm/px · 2 of 123 slices shown]
[im 41/123  soft-tissue]
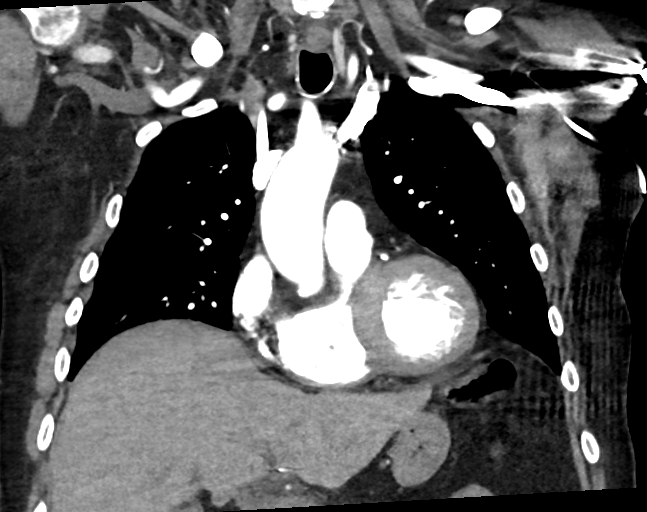
[im 82/123  soft-tissue]
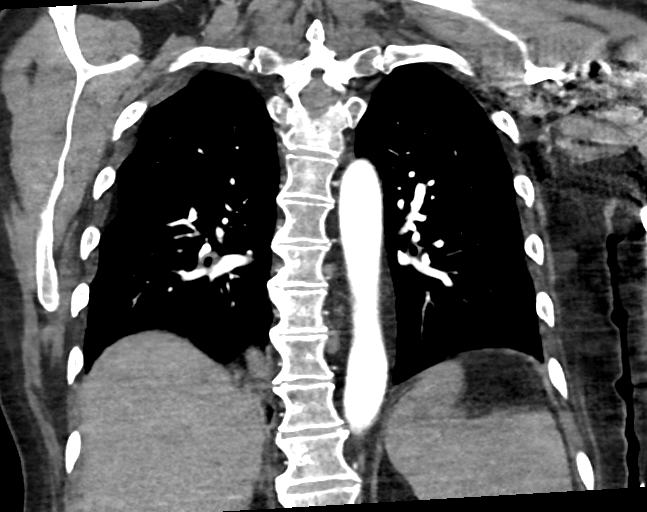

[18 of 46 positions shown; findings below may reference images not displayed]

RADIATION DOSE REDUCTION: This exam was performed according to the
departmental dose-optimization program which includes automated
exposure control, adjustment of the mA and/or kV according to
patient size and/or use of iterative reconstruction technique.

CONTRAST:  75mL OMNIPAQUE IOHEXOL 350 MG/ML SOLN
FINDINGS: Cardiovascular: Single subsegmental pulmonary arterial filling
defect in the right lower lobe (6/262). Coronary artery
calcification. Heart is enlarged. No pericardial effusion. Right IJ
dialysis catheter terminates in the right atrium.

Mediastinum/Nodes: No pathologically enlarged mediastinal, hilar or
axillary lymph nodes. Esophagus is grossly unremarkable.

Lungs/Pleura: Image quality is slightly degraded by expiratory phase
imaging, which creates increased density in the lungs. Minimal
patchy consolidation and nodularity in the perihilar right middle
lobe (5/70 5-79). Subsegmental scarring in the posterior right lower
lobe. No pleural fluid. Posterior pleural thickening bilaterally.
Airway is unremarkable.

Upper Abdomen: Liver transplant. Visualized portions of the adrenal
glands, spleen, pancreas, stomach and bowel are grossly
unremarkable.

Musculoskeletal: Degenerative changes in the spine. No worrisome
lytic or sclerotic lesions.

Review of the MIP images confirms the above findings.
IMPRESSION: 1. Single subsegmental pulmonary embolus in the right lower lobe, of
questionable clinical significance.
2. Minimal patchy consolidation and nodularity in the central right
middle lobe, likely infectious/inflammatory in etiology.
3. Coronary artery calcification.

## 2022-11-21 IMAGING — CT CT ABD-PELV W/ CM
2 of 5 series · 15 of 46 positions shown, 17 images · IV contrast (APPLIED)
Comparison: 03/01/2021.

CLINICAL DATA: Left lower quadrant pain and left back pain. History
of kidney stones. Weakness. Liver transplant.

EXAM:
CT ABDOMEN AND PELVIS WITH CONTRAST
TECHNIQUE: Multidetector CT imaging of the abdomen and pelvis was performed
using the standard protocol following bolus administration of
intravenous contrast.

[Series 2: abdomen 5.0 · axial · 0.79mm/px · z∈[+888,+1358]mm · 12 of 110 slices shown, 14 images]
[im 8/110  soft-tissue]
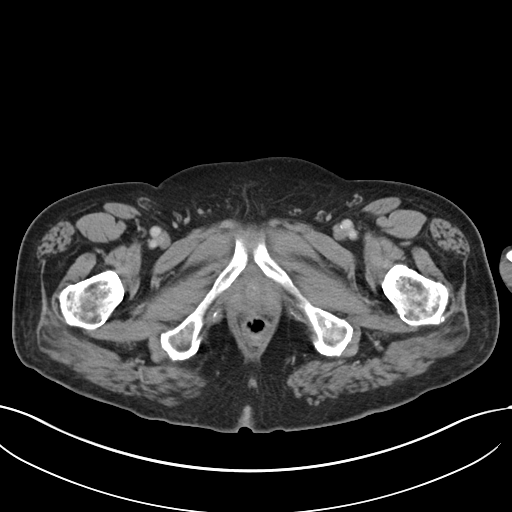
[im 8/110  bone]
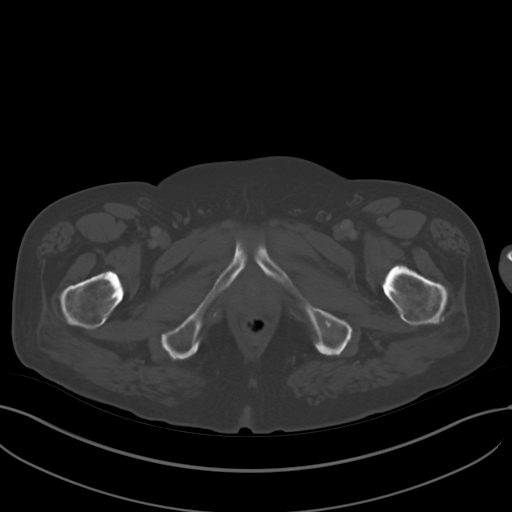
[im 16/110  soft-tissue]
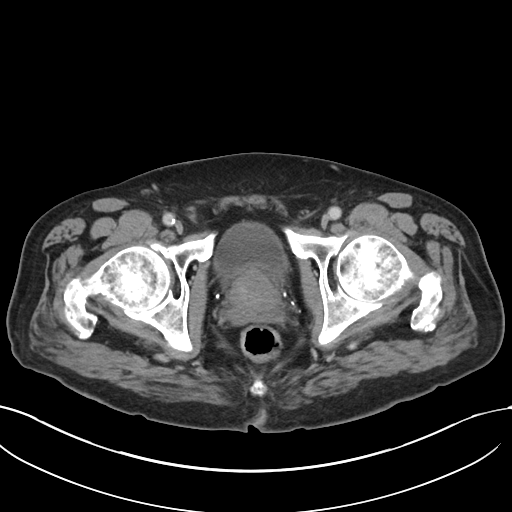
[im 24/110  soft-tissue]
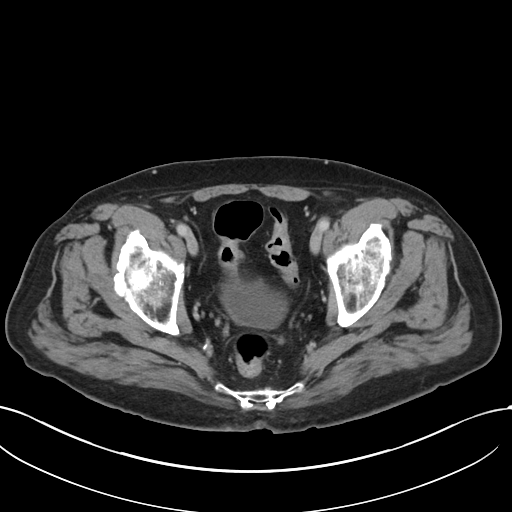
[im 32/110  soft-tissue]
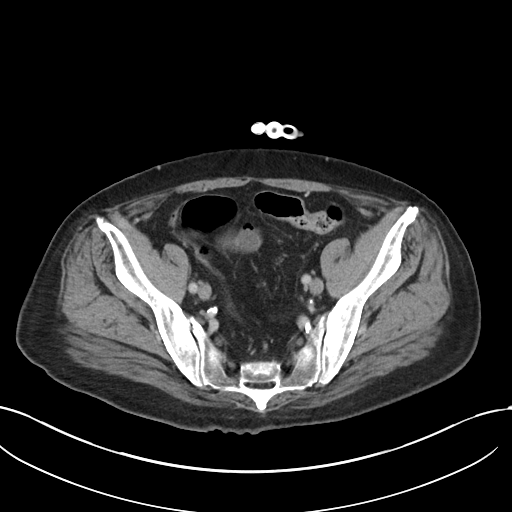
[im 39/110  soft-tissue]
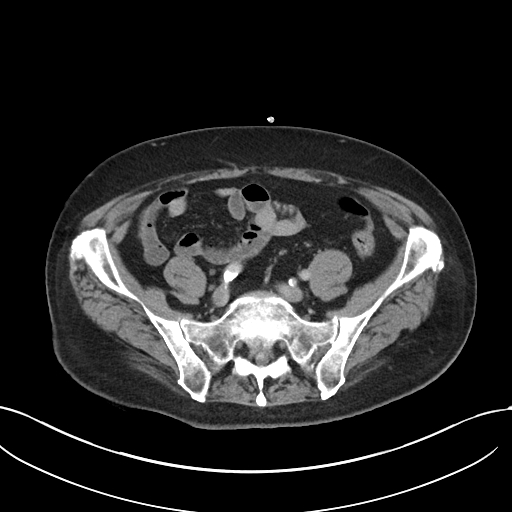
[im 47/110  soft-tissue]
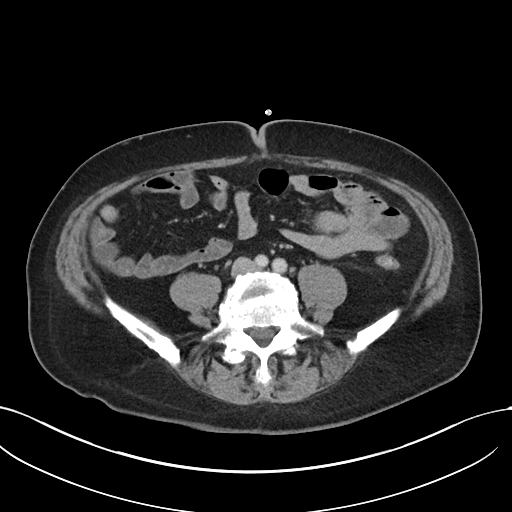
[im 63/110  soft-tissue]
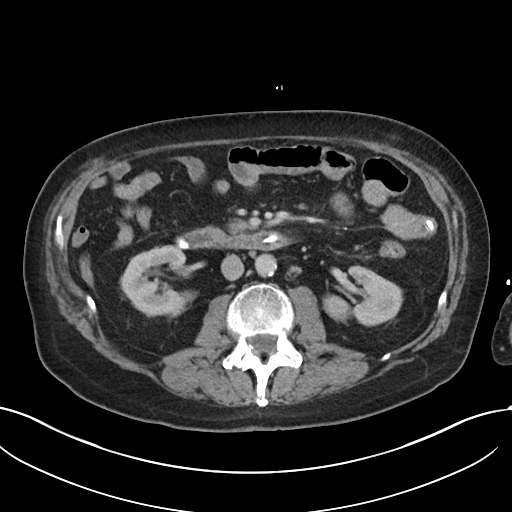
[im 71/110  soft-tissue]
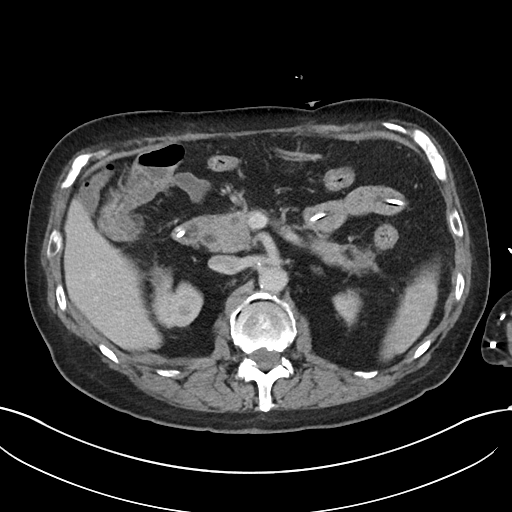
[im 78/110  soft-tissue]
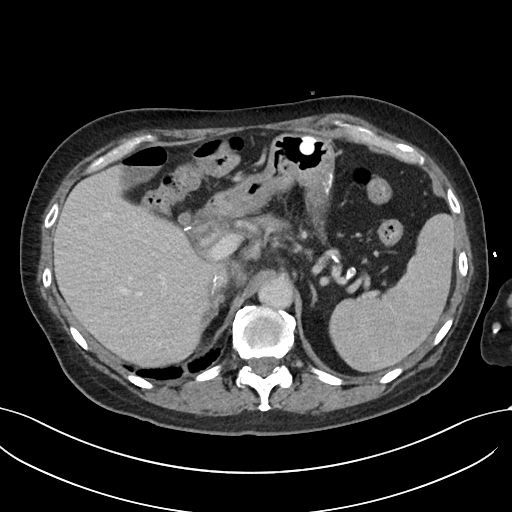
[im 78/110  bone]
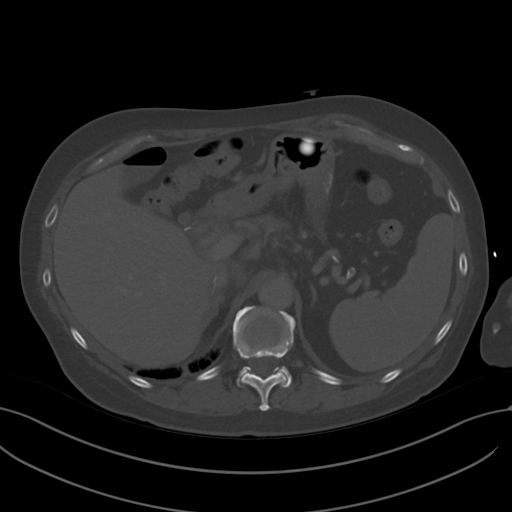
[im 86/110  soft-tissue]
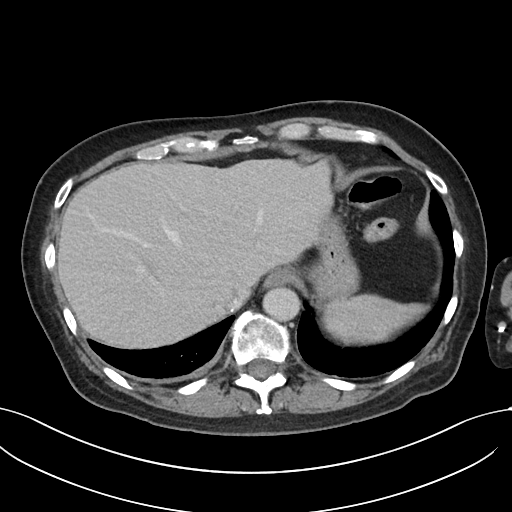
[im 94/110  soft-tissue]
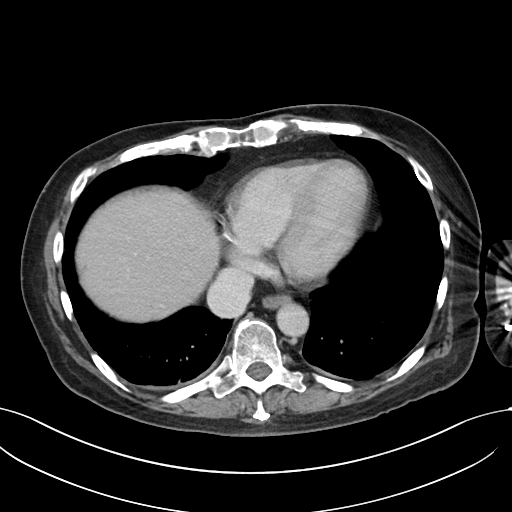
[im 102/110  soft-tissue]
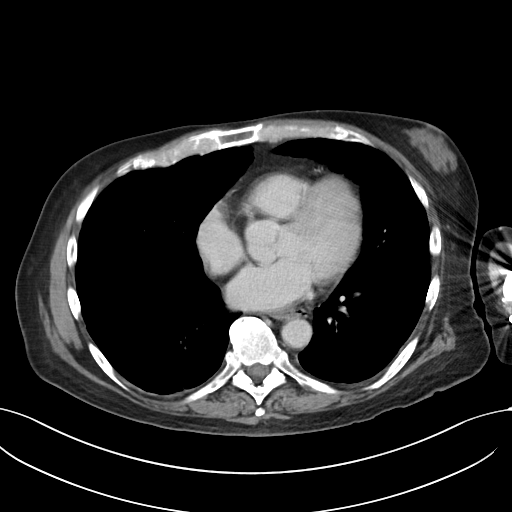

[Series 5: abdomen 3.0 mpr cor · coronal · 0.78mm/px · 3 of 90 slices shown]
[im 30/90  soft-tissue]
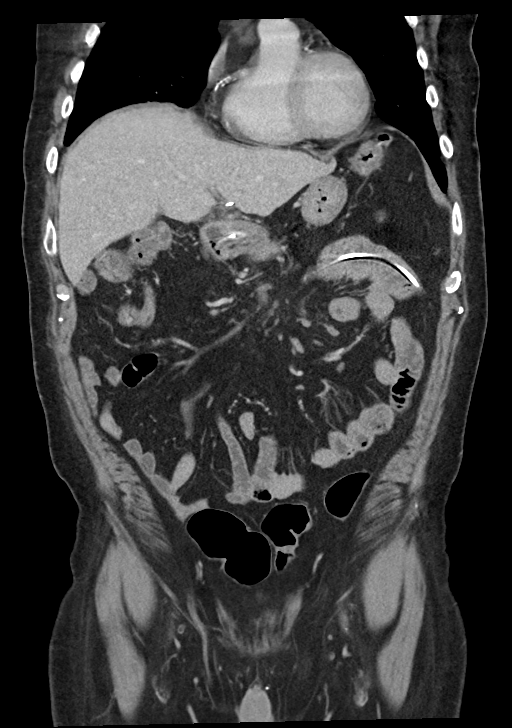
[im 40/90  soft-tissue]
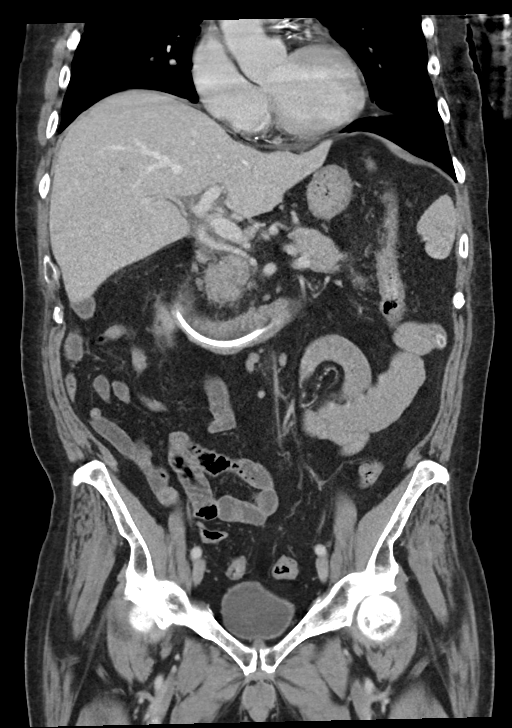
[im 50/90  soft-tissue]
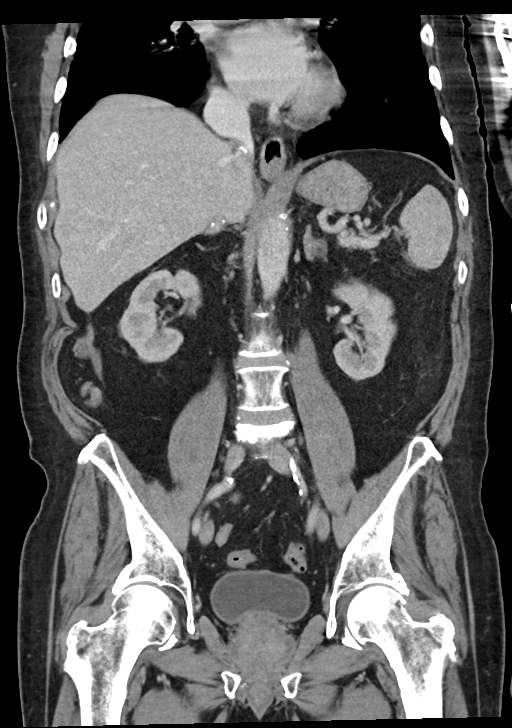

[15 of 46 positions shown; findings below may reference images not displayed]

RADIATION DOSE REDUCTION: This exam was performed according to the
departmental dose-optimization program which includes automated
exposure control, adjustment of the mA and/or kV according to
patient size and/or use of iterative reconstruction technique.

CONTRAST:  75mL OMNIPAQUE IOHEXOL 350 MG/ML SOLN
FINDINGS: Lower chest: Full CT chest dictated separately.

Hepatobiliary: Liver transplant. Liver is grossly unremarkable.
Cholecystectomy. Similar biliary ductal dilatation.

Pancreas: 7 mm low-attenuation lesion in the head of the pancreas
(2/40). Hyperdense lesion off the uncinate process of the pancreas
measures 14 mm and 47 Hounsfield units (2/45), similar dating back
to 10/16/2019. Mildly hyperdense fluid collections of the tail the
pancreas measure up to 1.9 x 2.2 cm, similar as well. No gland
atrophy or ductal dilatation.

Spleen: Negative.

Adrenals/Urinary Tract: Adrenal glands and kidneys are unremarkable.
Ureters are decompressed. Bladder is grossly unremarkable.

Stomach/Bowel: Percutaneous gastro jejunostomy. Stomach is
decompressed. Small bowel is otherwise unremarkable. Question right
hemicolectomy. Colon is otherwise unremarkable.

Vascular/Lymphatic: Atherosclerotic calcification of the aorta.
Retroperitoneal lymph nodes are not enlarged by CT size criteria.

Reproductive: Prostate is enlarged.

Other: Bilateral inguinal hernias contain fat. No free fluid. 5 mm
midline omental nodule (2/44), unchanged. Mesenteries and peritoneum
are otherwise unremarkable.

Musculoskeletal: Degenerative changes in the spine. No worrisome
lytic or sclerotic lesions. L2 and L5 compression fractures are
unchanged.
IMPRESSION: 1. No acute findings.
2. Mildly hyperdense lesions along the pancreas, similar and likely
pseudocysts. Difficult to exclude a cystic pancreatic neoplasm.
Consider follow-up CT abdomen without and with contrast in 1 year,
as clinically indicated. This recommendation follows ACR consensus
guidelines: Management of Incidental Pancreatic Cysts: A White Paper
of the ACR Incidental Findings Committee. [HOSPITAL]
3. Enlarged prostate.
4.  Aortic atherosclerosis ([id number]).

## 2022-12-11 ENCOUNTER — Emergency Department: Payer: Medicare Other

## 2022-12-11 ENCOUNTER — Other Ambulatory Visit: Payer: Self-pay

## 2022-12-11 ENCOUNTER — Emergency Department
Admission: EM | Admit: 2022-12-11 | Discharge: 2022-12-12 | Disposition: A | Discharge status: Home / Self Care | Payer: Medicare Other | Attending: Emergency Medicine | Admitting: Emergency Medicine

## 2022-12-11 DIAGNOSIS — E1122 Type 2 diabetes mellitus with diabetic chronic kidney disease: Secondary | ICD-10-CM | POA: Diagnosis not present

## 2022-12-11 DIAGNOSIS — Z992 Dependence on renal dialysis: Secondary | ICD-10-CM | POA: Diagnosis not present

## 2022-12-11 DIAGNOSIS — N186 End stage renal disease: Secondary | ICD-10-CM | POA: Insufficient documentation

## 2022-12-11 DIAGNOSIS — I2510 Atherosclerotic heart disease of native coronary artery without angina pectoris: Secondary | ICD-10-CM | POA: Insufficient documentation

## 2022-12-11 DIAGNOSIS — R1030 Lower abdominal pain, unspecified: Secondary | ICD-10-CM

## 2022-12-11 DIAGNOSIS — R1033 Periumbilical pain: Secondary | ICD-10-CM | POA: Insufficient documentation

## 2022-12-11 DIAGNOSIS — R112 Nausea with vomiting, unspecified: Secondary | ICD-10-CM | POA: Diagnosis present

## 2022-12-11 LAB — LIPASE, BLOOD: Lipase: 28 U/L (ref 11–51)

## 2022-12-11 LAB — CBC
HCT: 32.4 % — ABNORMAL LOW (ref 39.0–52.0)
Hemoglobin: 10.8 g/dL — ABNORMAL LOW (ref 13.0–17.0)
MCH: 30 pg (ref 26.0–34.0)
MCHC: 33.3 g/dL (ref 30.0–36.0)
MCV: 90 fL (ref 80.0–100.0)
Platelets: 163 10*3/uL (ref 150–400)
RBC: 3.6 MIL/uL — ABNORMAL LOW (ref 4.22–5.81)
RDW: 13.5 % (ref 11.5–15.5)
WBC: 8 10*3/uL (ref 4.0–10.5)
nRBC: 0 % (ref 0.0–0.2)

## 2022-12-11 LAB — COMPREHENSIVE METABOLIC PANEL
ALT: 11 U/L (ref 0–44)
AST: 16 U/L (ref 15–41)
Albumin: 4.1 g/dL (ref 3.5–5.0)
Alkaline Phosphatase: 108 U/L (ref 38–126)
Anion gap: 13 (ref 5–15)
BUN: 23 mg/dL (ref 8–23)
CO2: 23 mmol/L (ref 22–32)
Calcium: 9.1 mg/dL (ref 8.9–10.3)
Chloride: 100 mmol/L (ref 98–111)
Creatinine, Ser: 3.49 mg/dL — ABNORMAL HIGH (ref 0.61–1.24)
GFR, Estimated: 18 mL/min — ABNORMAL LOW (ref >60)
Glucose, Bld: 142 mg/dL — ABNORMAL HIGH (ref 70–99)
Potassium: 3.9 mmol/L (ref 3.5–5.1)
Sodium: 136 mmol/L (ref 135–145)
Total Bilirubin: 0.9 mg/dL (ref 0.3–1.2)
Total Protein: 7.1 g/dL (ref 6.5–8.1)

## 2022-12-11 MED ORDER — SODIUM CHLORIDE 0.9 % IV BOLUS
500.0000 mL | Freq: Once | INTRAVENOUS | Status: AC
  Administered 2022-12-11: 500 mL via INTRAVENOUS

## 2022-12-11 MED ORDER — METOCLOPRAMIDE HCL 5 MG/ML IJ SOLN
10.0000 mg | Freq: Once | INTRAMUSCULAR | Status: AC
  Administered 2022-12-11: 10 mg via INTRAVENOUS
  Filled 2022-12-11: qty 2

## 2022-12-11 MED ORDER — HYDROMORPHONE HCL 1 MG/ML IJ SOLN
1.0000 mg | Freq: Once | INTRAMUSCULAR | Status: AC
  Administered 2022-12-11: 1 mg via INTRAVENOUS
  Filled 2022-12-11: qty 1

## 2022-12-11 MED ORDER — IOHEXOL 9 MG/ML PO SOLN: 500.0000 mL | ORAL | Status: DC

## 2022-12-11 NOTE — ED Notes (Signed)
Patient transported to CT 

## 2022-12-11 NOTE — ED Triage Notes (Signed)
Pt presents to ER via ems from home with c/o lower abd pain and vomiting that started around 1500 today after eating some tuna.  Pt states his abd pain has been constant since starting.  States he has been unable to keep anything down since symptoms started.  Pt does have pmh of liver transplant and MI x2. Pt is otherwise A&O x4 and in NAD.

## 2022-12-11 NOTE — ED Triage Notes (Signed)
EMS brings pt in from home for c/o N/V/D since 3pm after eating salmon; hx gastroparesis

## 2022-12-11 NOTE — ED Notes (Addendum)
Per report from CT pt refused oral contrast. CT completed without contrast. MD aware.

## 2022-12-11 NOTE — ED Provider Notes (Signed)
[facility] Provider Note    Event Date/Time   First MD Initiated Contact with Patient 12/11/22 2107     (approximate)   History   Abdominal Pain and Emesis   HPI  Alex Hampton is a 67 y.o. male with history of liver transplant, CAD, hyperlipidemia, diabetes, gastroparesis, and ESRD on dialysis who presents with acute onset of nausea and vomiting about 6 hours ago after eating some tuna.  It is associated with periumbilical and lower abdominal pain which is midline in location and constant in intensity.  He had been having some diarrhea over the last couple days so took an antidiarrheal.  He has not had a bowel movement in the last several hours although he feels like he needs to.  He denies eating anything else out of the ordinary.  He has no fever or chills.  He denies any chest pain or difficulty breathing.  I reviewed the past medical records.  The patient's most recent outpatient encounter was with internal medicine on 8/21 for follow-up of his chronic conditions.   Physical Exam   Triage Vital Signs: ED Triage Vitals  Encounter Vitals Group     BP 12/11/22 2058 (!) 179/93     Systolic BP Percentile --      Diastolic BP Percentile --      Pulse Rate 12/11/22 2058 (!) 59     Resp 12/11/22 2058 16     Temp 12/11/22 2058 98 F (36.7 C)     Temp Source 12/11/22 2058 Oral     SpO2 12/11/22 2058 100 %     Weight 12/11/22 2100 171 lb (77.6 kg)     Height --      Head Circumference --      Peak Flow --      Pain Score 12/11/22 2059 7     Pain Loc --      Pain Education --      Exclude from Growth Chart --     Most recent vital signs: Vitals:   12/11/22 2058  BP: (!) 179/93  Pulse: (!) 59  Resp: 16  Temp: 98 F (36.7 C)  SpO2: 100%     General: Alert, relatively comfortable appearing, no distress.  CV:  Good peripheral perfusion.  Resp:  Normal effort.  Abd:  Soft with no focal tenderness.  No distention. Other:  Slightly dry mucous  membranes.  No jaundice or scleral icterus.   ED Results / Procedures / Treatments   Labs (all labs ordered are listed, but only abnormal results are displayed) Labs Reviewed  CBC - Abnormal; Notable for the following components:      Result Value   RBC 3.60 (*)    Hemoglobin 10.8 (*)    HCT 32.4 (*)    All other components within normal limits  COMPREHENSIVE METABOLIC PANEL - Abnormal; Notable for the following components:   Glucose, Bld 142 (*)    Creatinine, Ser 3.49 (*)    GFR, Estimated 18 (*)    All other components within normal limits  LIPASE, BLOOD  URINALYSIS, ROUTINE W REFLEX MICROSCOPIC     EKG     RADIOLOGY  CT abdomen/pelvis: I independently viewed and interpreted the images; no dilated bowel loops or any free air or free fluid.  Radiology  report indicates right periureteral stranding.  PROCEDURES:  Critical Care performed: No  Procedures   MEDICATIONS ORDERED IN ED: Medications  metoCLOPramide (REGLAN) injection 10 mg (10 mg  Intravenous Given 12/11/22 2306)  HYDROmorphone (DILAUDID) injection 1 mg (1 mg Intravenous Given 12/11/22 2305)  sodium chloride 0.9 % bolus 500 mL (500 mLs Intravenous New Bag/Given 12/11/22 2304)     IMPRESSION / MDM / ASSESSMENT AND PLAN / ED COURSE  I reviewed the triage vital signs and the nursing notes.  67 year old male with PMH as noted above presents with acute onset of nausea, vomiting, and lower abdominal pain this afternoon after eating tuna.  On exam, the patient is relatively comfortable appearing.  He is hypertensive with otherwise normal vital signs.  There is no significant abdominal tenderness or any peritoneal signs.  Initial lab workup is reassuring.  There is no leukocytosis.  CMP shows normal electrolytes and LFTs.  Lipase is normal.  Differential diagnosis includes, but is not limited to, foodborne illness, gastroenteritis, gastroparesis, PVD, gastritis, less likely colitis, diverticulitis, SBO.  Although  the patient's abdominal exam is relatively reassuring, given his significant comorbidities and the severity of his symptoms, we will obtain a CT for further evaluation.  I have ordered IV fluids, pain medication, and antiemetics.  Patient's presentation is most consistent with acute complicated illness / injury requiring diagnostic workup.  The patient is on the cardiac monitor to evaluate for evidence of arrhythmia and/or significant heart rate changes.  ----------------------------------------- 11:15 PM on 12/11/2022 -----------------------------------------  CT shows no acute abnormalities except for right periureteral stranding which is unlikely to be the cause of his acute symptoms today.  Urinalysis is still pending.  The patient has just received medications and started fluid bolus.  I anticipate he will be appropriate for discharge if his symptoms improve and he is tolerating p.o.  I have signed him out to the oncoming ED physician Dr. Katrinka Blazing.   FINAL CLINICAL IMPRESSION(S) / ED DIAGNOSES   Final diagnoses:  Nausea and vomiting, unspecified vomiting type  Lower abdominal pain     Rx / DC Orders   ED Discharge Orders     None        Note:  This document was prepared using Dragon voice recognition software and may include unintentional dictation errors.    Dionne Bucy, MD 12/11/22 2316

## 2022-12-12 DIAGNOSIS — R112 Nausea with vomiting, unspecified: Secondary | ICD-10-CM | POA: Diagnosis not present

## 2022-12-12 LAB — URINALYSIS, ROUTINE W REFLEX MICROSCOPIC
Bilirubin Urine: NEGATIVE
Glucose, UA: NEGATIVE mg/dL
Leukocytes,Ua: NEGATIVE
Nitrite: NEGATIVE
Protein, ur: 100 mg/dL — AB
Specific Gravity, Urine: 1.02 (ref 1.005–1.030)
pH: 8.5 — ABNORMAL HIGH (ref 5.0–8.0)

## 2022-12-12 LAB — URINALYSIS, MICROSCOPIC (REFLEX): RBC / HPF: >50 RBC/hpf (ref 0–5)

## 2022-12-12 MED ORDER — CEPHALEXIN 250 MG PO CAPS
250.0000 mg | ORAL_CAPSULE | Freq: Once | ORAL | Status: AC
  Administered 2022-12-12: 250 mg via ORAL
  Filled 2022-12-12: qty 1

## 2022-12-12 MED ORDER — CEPHALEXIN 250 MG PO CAPS: 250.0000 mg | ORAL_CAPSULE | Freq: Two times a day (BID) | ORAL | 0 refills | Status: AC

## 2022-12-12 NOTE — ED Notes (Signed)
RN went with another to perform straight cath and pt requests to "change his mind." Pt provided with urinal to attempt urine collection

## 2022-12-12 NOTE — Discharge Instructions (Addendum)
Keflex antibiotic twice daily for 5 days to treat the possibility of a urinary infection

## 2022-12-12 NOTE — ED Notes (Signed)
Patient discharged at this time. Ambulated to lobby with independent and steady gait. Breathing unlabored speaking in full sentences. Verbalized understanding of all discharge, follow up, and medication teaching. Discharged homed with all belongings.   

## 2022-12-12 NOTE — ED Notes (Signed)
Pt declines to attempt to void for urine collection. Requests straight cath for collection. MD made aware.

## 2022-12-12 NOTE — ED Provider Notes (Signed)
Patient received in signout from Dr. Marisa Severin pending urinalysis and reassessment after medications.  On my reassessment, patient reports resolution of symptoms after antiemetics and has no pain, feeling well.  His urinalysis was noted to have rare bacteria and greater than 50 red cells.  He does report a history of kidney stones, reports his symptoms today were not consistent with his kidney stones in the past.  We discussed ureteral stranding and red cells in the urine as possibly a recently passed stone versus infection.  After discussing risk and benefits, we agree to go ahead with a few days of Keflex to treat for the possibility of infection.  He is suitable for outpatient management.  Discussed return precautions.   Delton Prairie, MD 12/12/22 [phone]

## 2022-12-13 LAB — URINE CULTURE: Culture: <10000 — AB

## 2023-01-26 ENCOUNTER — Other Ambulatory Visit: Payer: Self-pay | Admitting: Physician Assistant

## 2023-01-26 DIAGNOSIS — I2510 Atherosclerotic heart disease of native coronary artery without angina pectoris: Secondary | ICD-10-CM

## 2023-01-26 DIAGNOSIS — R9439 Abnormal result of other cardiovascular function study: Secondary | ICD-10-CM

## 2023-01-29 ENCOUNTER — Telehealth (HOSPITAL_COMMUNITY): Payer: Self-pay | Admitting: Emergency Medicine

## 2023-01-29 MED ORDER — METOPROLOL TARTRATE 100 MG PO TABS: 100.0000 mg | ORAL_TABLET | Freq: Once | ORAL | 0 refills | Status: AC

## 2023-01-29 NOTE — Telephone Encounter (Signed)
Reaching out to patient to offer assistance regarding upcoming cardiac imaging study; pt verbalizes understanding of appt date/time, parking situation and where to check in, pre-test NPO status and medications ordered, and verified current allergies; name and call back number provided for further questions should they arise Rockwell Alexandria RN Navigator Cardiac Imaging [facility] [phone] office [phone] cell  ESRD - dialysis Tu, Thu, Sat x 2 years 114/60 post dialysis  100mg  metoprolol tartrate

## 2023-01-29 NOTE — Telephone Encounter (Signed)
Attempted to call patient regarding upcoming cardiac CT appointment. °Left message on voicemail with name and callback number °Dwan Fennel RN Navigator Cardiac Imaging °[facility] °[phone] Office °[phone] Cell ° °

## 2023-01-31 ENCOUNTER — Ambulatory Visit
Admission: RE | Admit: 2023-01-31 | Discharge: 2023-01-31 | Disposition: A | Discharge status: Home / Self Care | Payer: Medicare Other | Source: Ambulatory Visit | Attending: Physician Assistant | Admitting: Physician Assistant

## 2023-01-31 DIAGNOSIS — I2510 Atherosclerotic heart disease of native coronary artery without angina pectoris: Secondary | ICD-10-CM | POA: Insufficient documentation

## 2023-01-31 DIAGNOSIS — R9439 Abnormal result of other cardiovascular function study: Secondary | ICD-10-CM | POA: Diagnosis present

## 2023-01-31 MED ORDER — IOHEXOL 350 MG/ML SOLN: 70.0000 mL | Freq: Once | INTRAVENOUS | Status: DC | PRN

## 2023-01-31 MED ORDER — NITROGLYCERIN 0.4 MG SL SUBL
0.8000 mg | SUBLINGUAL_TABLET | Freq: Once | SUBLINGUAL | Status: AC
  Administered 2023-01-31: 0.8 mg via SUBLINGUAL

## 2023-01-31 MED ORDER — IOHEXOL 350 MG/ML SOLN
80.0000 mL | Freq: Once | INTRAVENOUS | Status: AC | PRN
  Administered 2023-01-31: 80 mL via INTRAVENOUS

## 2023-01-31 NOTE — Progress Notes (Signed)

## 2023-01-31 NOTE — Progress Notes (Signed)
Pt states he is on a fluid restriction but he feels fine. Pt educated on the need to increase water intake today d/t receiving IV contrast. Pt agreeable.

## 2023-02-14 ENCOUNTER — Encounter: Payer: Self-pay | Admitting: Dermatology

## 2023-02-14 ENCOUNTER — Ambulatory Visit (INDEPENDENT_AMBULATORY_CARE_PROVIDER_SITE_OTHER): Payer: Medicare Other | Admitting: Dermatology

## 2023-02-14 DIAGNOSIS — Z1283 Encounter for screening for malignant neoplasm of skin: Secondary | ICD-10-CM | POA: Diagnosis not present

## 2023-02-14 DIAGNOSIS — L219 Seborrheic dermatitis, unspecified: Secondary | ICD-10-CM

## 2023-02-14 DIAGNOSIS — D229 Melanocytic nevi, unspecified: Secondary | ICD-10-CM

## 2023-02-14 DIAGNOSIS — L578 Other skin changes due to chronic exposure to nonionizing radiation: Secondary | ICD-10-CM

## 2023-02-14 DIAGNOSIS — Z7189 Other specified counseling: Secondary | ICD-10-CM

## 2023-02-14 DIAGNOSIS — B353 Tinea pedis: Secondary | ICD-10-CM

## 2023-02-14 DIAGNOSIS — L814 Other melanin hyperpigmentation: Secondary | ICD-10-CM | POA: Diagnosis not present

## 2023-02-14 DIAGNOSIS — Z79899 Other long term (current) drug therapy: Secondary | ICD-10-CM

## 2023-02-14 MED ORDER — KETOCONAZOLE 2 % EX CREA: TOPICAL_CREAM | CUTANEOUS | 11 refills | Status: DC

## 2023-02-14 MED ORDER — HYDROCORTISONE 2.5 % EX CREA: TOPICAL_CREAM | Freq: Every day | CUTANEOUS | 11 refills | Status: DC

## 2023-02-14 MED ORDER — KETOCONAZOLE 2 % EX SHAM: MEDICATED_SHAMPOO | CUTANEOUS | 11 refills | Status: DC

## 2023-02-14 NOTE — Progress Notes (Signed)
Follow-Up Visit   Subjective  Alex Hampton is a 67 y.o. male who presents for the following: Skin Cancer Screening and Full Body Skin Exam. No personal or family history of skin cancer.   The patient presents for Total-Body Skin Exam (TBSE) for skin cancer screening and mole check. The patient has spots, moles and lesions to be evaluated, some may be new or changing and the patient may have concern these could be cancer.    The following portions of the chart were reviewed this encounter and updated as appropriate: medications, allergies, medical history  Review of Systems:  No other skin or systemic complaints except as noted in HPI or Assessment and Plan.  Objective  Well appearing patient in no apparent distress; mood and affect are within normal limits.  A full examination was performed including scalp, head, eyes, ears, nose, lips, neck, chest, axillae, abdomen, back, buttocks, bilateral upper extremities, bilateral lower extremities, hands, feet, fingers, toes, fingernails, and toenails. All findings within normal limits unless otherwise noted below.   Relevant physical exam findings are noted in the Assessment and Plan.    Assessment & Plan   SKIN CANCER SCREENING PERFORMED TODAY.  ACTINIC DAMAGE - Chronic condition, secondary to cumulative UV/sun exposure - diffuse scaly erythematous macules with underlying dyspigmentation - Recommend daily broad spectrum sunscreen SPF 30+ to sun-exposed areas, reapply every 2 hours as needed.  - Staying in the shade or wearing long sleeves, sun glasses (UVA+UVB protection) and wide brim hats (4-inch brim around the entire circumference of the hat) are also recommended for sun protection.  - Call for new or changing lesions.  LENTIGINES, SEBORRHEIC KERATOSES, HEMANGIOMAS - Benign normal skin lesions - Benign-appearing - Call for any changes  MELANOCYTIC NEVI - Tan-brown and/or pink-flesh-colored symmetric macules and papules -  Benign appearing on exam today - Observation - Call clinic for new or changing moles - Recommend daily use of broad spectrum spf 30+ sunscreen to sun-exposed areas.    SEBORRHEIC DERMATITIS Exam: Pink patches with greasy scale at bearded area and scalp  Chronic and persistent condition with duration or expected duration over one year. Condition is symptomatic/ bothersome to patient. Not currently at goal.   Seborrheic Dermatitis is a chronic persistent rash characterized by pinkness and scaling most commonly of the mid face but also can occur on the scalp (dandruff), ears; mid chest, mid back and groin.  It tends to be exacerbated by stress and cooler weather.  People who have neurologic disease may experience new onset or exacerbation of existing seborrheic dermatitis.  The condition is not curable but treatable and can be controlled.  Treatment Plan: Continue Ketoconazole 2 % shampoo - apply three times per week, massage into scalp and leave in for a few minutes before rinsing out     Continue Ketoconazole 2 % cream - apply topically to affected areas of face at at bedtime on MWF    Continue Hydrocortisone 2.5 % cream - apply topically to affected areas of face at bedtime on T,Th,Sat   Topical steroids (such as triamcinolone, fluocinolone, fluocinonide, mometasone, clobetasol, halobetasol, betamethasone, hydrocortisone) can cause thinning and lightening of the skin if they are used for too long in the same area. Your physician has selected the right strength medicine for your problem and area affected on the body. Please use your medication only as directed by your physician to prevent side effects.   TINEA PEDIS Exam: Scaling and maceration web spaces and over distal and lateral  soles. Chronic and persistent condition with duration or expected duration over one year. Condition is symptomatic / bothersome to patient. Not to goal.  Treatment Plan: Apply Ketoconazole 2% cream to feet  every night until clear.   Seborrheic dermatitis  Related Medications hydrocortisone 2.5 % cream Apply topically at bedtime. Apply to face and scalp use on T, Th, Sat  ketoconazole (NIZORAL) 2 % shampoo apply three times per week, massage into scalp and leave in for a few minutes before rinsing out   Return in about 1 year (around 02/14/2024) for TBSE.  I, Lawson Radar, CMA, am acting as scribe for Armida Sans, MD.   Documentation: I have reviewed the above documentation for accuracy and completeness, and I agree with the above.  Armida Sans, MD

## 2023-02-14 NOTE — Patient Instructions (Addendum)
Recommend daily broad spectrum sunscreen SPF 30+ to sun-exposed areas, reapply every 2 hours as needed. Call for new or changing lesions.  Staying in the shade or wearing long sleeves, sun glasses (UVA+UVB protection) and wide brim hats (4-inch brim around the entire circumference of the hat) are also recommended for sun protection.    Apply Ketoconazole 2% cream to feet every night until clear.   Continue Ketoconazole 2 % shampoo - apply three times per week, massage into scalp and leave in for a few minutes before rinsing out     Continue Ketoconazole 2 % cream - apply topically to affected areas of face at at bedtime on MWF    Continue Hydrocortisone 2.5 % cream - apply topically to affected areas of face at bedtime on T,Th,Sat   Topical steroids (such as triamcinolone, fluocinolone, fluocinonide, mometasone, clobetasol, halobetasol, betamethasone, hydrocortisone) can cause thinning and lightening of the skin if they are used for too long in the same area. Your physician has selected the right strength medicine for your problem and area affected on the body. Please use your medication only as directed by your physician to prevent side effects.     Melanoma ABCDEs  Melanoma is the most dangerous type of skin cancer, and is the leading cause of death from skin disease.  You are more likely to develop melanoma if you: Have light-colored skin, light-colored eyes, or red or blond hair Spend a lot of time in the sun Tan regularly, either outdoors or in a tanning bed Have had blistering sunburns, especially during childhood Have a close family member who has had a melanoma Have atypical moles or large birthmarks  Early detection of melanoma is key since treatment is typically straightforward and cure rates are extremely high if we catch it early.   The first sign of melanoma is often a change in a mole or a new dark spot.  The ABCDE system is a way of remembering the signs of melanoma.  A  for asymmetry:  The two halves do not match. B for border:  The edges of the growth are irregular. C for color:  A mixture of colors are present instead of an even brown color. D for diameter:  Melanomas are usually (but not always) greater than 6mm - the size of a pencil eraser. E for evolution:  The spot keeps changing in size, shape, and color.  Please check your skin once per month between visits. You can use a small mirror in front and a large mirror behind you to keep an eye on the back side or your body.   If you see any new or changing lesions before your next follow-up, please call to schedule a visit.  Please continue daily skin protection including broad spectrum sunscreen SPF 30+ to sun-exposed areas, reapplying every 2 hours as needed when you're outdoors.   Staying in the shade or wearing long sleeves, sun glasses (UVA+UVB protection) and wide brim hats (4-inch brim around the entire circumference of the hat) are also recommended for sun protection.       Due to recent changes in healthcare laws, you may see results of your pathology and/or laboratory studies on MyChart before the doctors have had a chance to review them. We understand that in some cases there may be results that are confusing or concerning to you. Please understand that not all results are received at the same time and often the doctors may need to interpret multiple results in order  to provide you with the best plan of care or course of treatment. Therefore, we ask that you please give [place] 2 business days to thoroughly review all your results before contacting the office for clarification. Should we see a critical lab result, you will be contacted sooner.   If You Need Anything After Your Visit  If you have any questions or concerns for your doctor, please call our main line at [phone] and press option 4 to reach your doctor's medical assistant. If no one answers, please leave a voicemail as directed and we  will return your call as soon as possible. Messages left after 4 pm will be answered the following business day.   You may also send [place] a message via MyChart. We typically respond to MyChart messages within 1-2 business days.  For prescription refills, please ask your pharmacy to contact our office. Our fax number is [phone].  If you have an urgent issue when the clinic is closed that cannot wait until the next business day, you can page your doctor at the number below.    Please note that while we do our best to be available for urgent issues outside of office hours, we are not available 24/7.   If you have an urgent issue and are unable to reach [place], you may choose to seek medical care at your doctor's office, retail clinic, urgent care center, or emergency room.  If you have a medical emergency, please immediately call 911 or go to the emergency department.  Pager Numbers  - Dr. Gwen Pounds: [phone]  - Dr. Roseanne Reno: [phone]  - Dr. Katrinka Blazing: [phone]   In the event of inclement weather, please call our main line at [phone] for an update on the status of any delays or closures.  Dermatology Medication Tips: Please keep the boxes that topical medications come in in order to help keep track of the instructions about where and how to use these. Pharmacies typically print the medication instructions only on the boxes and not directly on the medication tubes.   If your medication is too expensive, please contact our office at [phone] option 4 or send [place] a message through MyChart.   We are unable to tell what your co-pay for medications will be in advance as this is different depending on your insurance coverage. However, we may be able to find a substitute medication at lower cost or fill out paperwork to get insurance to cover a needed medication.   If a prior authorization is required to get your medication covered by your insurance company, please allow [place] 1-2  business days to complete this process.  Drug prices often vary depending on where the prescription is filled and some pharmacies may offer cheaper prices.  The website www.goodrx.com contains coupons for medications through different pharmacies. The prices here do not account for what the cost may be with help from insurance (it may be cheaper with your insurance), but the website can give you the price if you did not use any insurance.  - You can print the associated coupon and take it with your prescription to the pharmacy.  - You may also stop by our office during regular business hours and pick up a GoodRx coupon card.  - If you need your prescription sent electronically to a different pharmacy, notify our office through [facility] or by phone at [phone] option 4.     Si Usted Necesita Algo Despus de Su Visita  Tambin puede enviarnos  un mensaje a travs de MyChart. Por lo general respondemos a los mensajes de MyChart en el transcurso de 1 a 2 das hbiles.  Para renovar recetas, por favor pida a su farmacia que se ponga en contacto con nuestra oficina. Annie Sable de fax es [place] [phone].  Si tiene un asunto urgente cuando la clnica est cerrada y que no puede esperar hasta el siguiente da hbil, puede llamar/localizar a su doctor(a) al nmero que aparece a continuacin.   Por favor, tenga en cuenta que aunque hacemos todo lo posible para estar disponibles para asuntos urgentes fuera del horario de [place], no estamos disponibles las 24 horas del da, los 7 [address]  [address] de la [place].   Si tiene un problema urgente y no puede comunicarse con nosotros, puede optar por buscar atencin mdica  en el consultorio de su doctor(a), en una clnica privada, en un centro de atencin urgente o en una sala de emergencias.  Si tiene Engineer, drilling, por favor llame inmediatamente al 911 o vaya a la sala de emergencias.  Nmeros de bper  - Dr. Gwen Pounds: [phone]  - Dra.  Roseanne Reno: [phone]  - Dr. Katrinka Blazing: [phone]   En caso de inclemencias del tiempo, por favor llame a Lacy Duverney principal al [phone] para una actualizacin sobre el [place] de cualquier retraso o cierre.  Consejos para la medicacin en dermatologa: Por favor, guarde las cajas en las que vienen los medicamentos de uso tpico para ayudarle a seguir las instrucciones sobre dnde y cmo usarlos. Las farmacias generalmente imprimen las instrucciones del medicamento slo en las cajas y no directamente en los tubos del [place].   Si su medicamento es muy caro, por favor, pngase en contacto con Rolm Gala llamando al [phone] y presione la opcin 4 o envenos un mensaje a travs de Clinical cytogeneticist.   No podemos decirle cul ser su copago por los medicamentos por adelantado ya que esto es diferente dependiendo de la cobertura de su seguro. Sin embargo, es posible que podamos encontrar un medicamento sustituto a Audiological scientist un formulario para que el seguro cubra el medicamento que se considera necesario.   Si se requiere una autorizacin previa para que su compaa de seguros [place] su medicamento, por favor permtanos de 1 a 2 das hbiles para completar [address]  Los precios de los medicamentos varan con frecuencia dependiendo del Environmental consultant de dnde se surte la receta y alguna farmacias pueden ofrecer precios ms baratos.  El sitio web www.goodrx.com tiene cupones para medicamentos de Health and safety inspector. Los precios aqu no tienen en cuenta lo que podra costar con la ayuda del seguro (puede ser ms barato con su seguro), pero el sitio web puede darle el precio si no utiliz Tourist information centre manager.  - Puede imprimir el cupn correspondiente y llevarlo con su receta a la farmacia.  - Tambin puede pasar por nuestra oficina durante el horario de atencin regular y Education officer, museum una tarjeta de cupones de GoodRx.  - Si necesita que su receta se enve electrnicamente a una farmacia diferente,  informe a nuestra oficina a travs de MyChart de  o por telfono llamando al [phone] y presione la opcin 4.

## 2024-01-30 NOTE — Procedures (Signed)
 Operative Note    NAME: Alex Hampton  AGE: 68 y.o.  GENDER: male DATE OF SURGERY: 01/30/24   MRN: [id number]  PREOPERATIVE DIAGNOSIS End stage renal disease  POSTOPERATIVE DIAGNOSIS End stage renal disease  OPERATIVE PROCEDURE Laparoscopic assisted peritoneal dialysis catheter placement  ATTENDING SURGEON: Lillia Gale, MD  FELLOW: Ronnald Diesel, MD  ESTIMATED BLOOD LOSS: 1 ml  FLUIDS ADMINISTERED: 300cc  URINE OUTPUT: n/a  SPECIMEN: N/a  IMPLANTS: PD Cath  DRAINS: None  COMPLICATIONS: None   INDICATION Alex Hampton is a 68 y.o. year old male has end stage renal disease due to hypertension. The patient presents for placement of a peritoneal dialysis catheter for administration of renal replacement therapy.   FINDINGS: No significant pelvic adhesions or hernia.    INFORMED CONSENT: I had a detailed discussion regarding the indications, risks and possible complications with the patient.  I also explained the possible complications from undergoing general anesthesia including but not limited to, myocardial infarction, stroke, paralysis, heart failure, pulmonary embolism, and other.  I believe the patient had good understanding of the risks and benefits discussed and demonstrated a good understanding by asking appropriate questions, which were answered, and he would like to proceed with surgery.    PROCEDURE DETAILS: The patient was brought to the operating room and placed supine on the table. The patient had all pressure points padded and SCDs were placed prior to induction. A timeout was conducted as per [facility] protocol and included identification of patient, planned operative procedure, presence of a signed surgical consent, presence of comorbidities, drug allergies, infusion of prophylatic antibiotics, and appropriate DVT prophylaxis.  After adequate general endotracheal anesthesia was achieved, the appropriate monitoring line were placed by  anesthesia, a foley catheter was not inserted. The abdomen was clipped of hair and prepped and draped in the usual sterile surgical fashion. Appropriate antibiotics were administered within 30 minutes of incision.  A 5 mm incision was made in the left upper quadrant and the peritoneal cavity was accessed with an optiview technique using a 5-0 laparoscope.  Pneumoperitoneum was established without complication and the laparoscope introduced to the peritoneal cavity and the viscera inspected. No injuries were identified. A periumbilical incision was made and deepened to the fascia with electrocautery.  A finder needle was then advanced into the peritoneal cavity, tunneling through the rectus towards the midline. A stiff glidwire was introduced through the needle and then dilated using a dilator with the peel-away sheath. The dilator was removed and the PD catheter inserted into the peritoneal cavity with the tip of the catheter in the pelvis.  The peel-away sheath was removed.  The outer part of the PD catheter was then tunneled through the subcutaneous tissue to exit at the trocar site. Approximately 1 liter of lactated ringer with heparin  was then instilled into the peritoneal cavity which returned with ease.  Pneumoperitoneum was evacuated.  The periumbilical incision was closed with a deep layer of interrupted 3-0 Vicryl followed by subcuticular 4-0 Biosyn, and surgical glue. A sterile occlusive dressing was applied over the peritoneal dialysis catheter.  Needle, sponge and instrument count was performed and was found to be correct.   PATIENT DISPOSITION: The patient was extubated and brought to the PACU in a stable condition.    MEDICATIONS RECEIVED DURING CASE:  - Antibiotic prophylaxis: Ancef FR- Surgery - (Entire) - I was present throughout the entire procedure.  I have reviewed and agree with the procedural note as documented by the resident (FR).

## 2024-01-31 NOTE — Progress Notes (Signed)
 Anesthesia Transfer of Care Note  Patient: Alex Hampton  Procedures performed: LAPAROSCOPY, SURGICAL; WITH INSERTION OF TUNNELED INTRAPERITONEAL CATHETER (Abdomen)  Report given/handover to: PACU Nurse

## 2024-01-31 NOTE — Telephone Encounter (Addendum)
 Labs reviewed with NP Dawkins. Plan to repeat labs in 3 months. Patient made aware via mychart.   ----- Message from [facility] DAWKINS, NP sent at 01/31/2024  8:25 AM EDT ----- No changes, labs in 3 months. ----- Message ----- From: Myrna Bailey, RN Sent: 01/31/2024   7:21 AM EDT To: Lauraine Ladora Lento, NP  Fk 6.2 on 2/3 mg. Goal 3-5 LFTs stable. Alk phos 159 Cr 3.67 Had PD cath placed yesterday Any changes? When would you like to repeat?

## 2024-02-11 ENCOUNTER — Ambulatory Visit: Admitting: Dermatology

## 2024-02-11 DIAGNOSIS — D229 Melanocytic nevi, unspecified: Secondary | ICD-10-CM

## 2024-02-11 DIAGNOSIS — D492 Neoplasm of unspecified behavior of bone, soft tissue, and skin: Secondary | ICD-10-CM | POA: Diagnosis not present

## 2024-02-11 DIAGNOSIS — D225 Melanocytic nevi of trunk: Secondary | ICD-10-CM | POA: Diagnosis not present

## 2024-02-11 DIAGNOSIS — Z1283 Encounter for screening for malignant neoplasm of skin: Secondary | ICD-10-CM | POA: Diagnosis not present

## 2024-02-11 DIAGNOSIS — Z7189 Other specified counseling: Secondary | ICD-10-CM

## 2024-02-11 DIAGNOSIS — L814 Other melanin hyperpigmentation: Secondary | ICD-10-CM

## 2024-02-11 DIAGNOSIS — L219 Seborrheic dermatitis, unspecified: Secondary | ICD-10-CM

## 2024-02-11 DIAGNOSIS — L578 Other skin changes due to chronic exposure to nonionizing radiation: Secondary | ICD-10-CM

## 2024-02-11 DIAGNOSIS — W908XXA Exposure to other nonionizing radiation, initial encounter: Secondary | ICD-10-CM

## 2024-02-11 DIAGNOSIS — Z79899 Other long term (current) drug therapy: Secondary | ICD-10-CM

## 2024-02-11 DIAGNOSIS — D239 Other benign neoplasm of skin, unspecified: Secondary | ICD-10-CM

## 2024-02-11 DIAGNOSIS — L821 Other seborrheic keratosis: Secondary | ICD-10-CM

## 2024-02-11 DIAGNOSIS — B353 Tinea pedis: Secondary | ICD-10-CM

## 2024-02-11 HISTORY — DX: Other benign neoplasm of skin, unspecified: D23.9

## 2024-02-11 MED ORDER — KETOCONAZOLE 2 % EX SHAM: MEDICATED_SHAMPOO | CUTANEOUS | 11 refills | Status: AC

## 2024-02-11 MED ORDER — KETOCONAZOLE 2 % EX CREA: TOPICAL_CREAM | CUTANEOUS | 11 refills | Status: AC

## 2024-02-11 MED ORDER — HYDROCORTISONE 2.5 % EX CREA: TOPICAL_CREAM | Freq: Every day | CUTANEOUS | 11 refills | Status: AC

## 2024-02-11 NOTE — Patient Instructions (Addendum)

## 2024-02-11 NOTE — Progress Notes (Unsigned)
 Follow-Up Visit   Subjective  Alex Hampton is a 68 y.o. male who presents for the following: Skin Cancer Screening and Full Body Skin Exam, rash on the face and scalp treating with Ketoconazole  shampoo, Ketoconazole  cream and Hydrocortisone  cream with a good response  The patient presents for Total-Body Skin Exam (TBSE) for skin cancer screening and mole check. The patient has spots, moles and lesions to be evaluated, some may be new or changing and the patient may have concern these could be cancer.  The following portions of the chart were reviewed this encounter and updated as appropriate: medications, allergies, medical history  Review of Systems:  No other skin or systemic complaints except as noted in HPI or Assessment and Plan.  Objective  Well appearing patient in no apparent distress; mood and affect are within normal limits.  A full examination was performed including scalp, head, eyes, ears, nose, lips, neck, chest, axillae, abdomen, back, buttocks, bilateral upper extremities, bilateral lower extremities, hands, feet, fingers, toes, fingernails, and toenails. All findings within normal limits unless otherwise noted below.   Relevant physical exam findings are noted in the Assessment and Plan.  left superior medial scapula 0.6 cm Irregular brown macule    Assessment & Plan   Patient is currently on hemodialysis transitioning in to  peritoneal dialysis Liver transplant patient   SKIN CANCER SCREENING PERFORMED TODAY.  ACTINIC DAMAGE - Chronic condition, secondary to cumulative UV/sun exposure - diffuse scaly erythematous macules with underlying dyspigmentation - Recommend daily broad spectrum sunscreen SPF 30+ to sun-exposed areas, reapply every 2 hours as needed.  - Staying in the shade or wearing long sleeves, sun glasses (UVA+UVB protection) and wide brim hats (4-inch brim around the entire circumference of the hat) are also recommended for sun protection.  - Call  for new or changing lesions.  LENTIGINES, SEBORRHEIC KERATOSES, HEMANGIOMAS - Benign normal skin lesions - Benign-appearing - Call for any changes  MELANOCYTIC NEVI - Tan-brown and/or pink-flesh-colored symmetric macules and papules - Benign appearing on exam today - Observation - Call clinic for new or changing moles - Recommend daily use of broad spectrum spf 30+ sunscreen to sun-exposed areas.    SEBORRHEIC DERMATITIS Exam: Pink patches with greasy scale at bearded area and scalp Chronic and persistent condition with duration or expected duration over one year. Condition is symptomatic/ bothersome to patient. Not currently at goal. Seborrheic Dermatitis is a chronic persistent rash characterized by pinkness and scaling most commonly of the mid face but also can occur on the scalp (dandruff), ears; mid chest, mid back and groin.  It tends to be exacerbated by stress and cooler weather.  People who have neurologic disease may experience new onset or exacerbation of existing seborrheic dermatitis.  The condition is not curable but treatable and can be controlled. Treatment Plan: Continue Ketoconazole  2 % shampoo - apply three times per week, massage into scalp and leave in for a few minutes before rinsing out     Continue Ketoconazole  2 % cream - apply topically to affected areas of face at at bedtime on MWF  Continue Hydrocortisone  2.5 % cream - apply topically to affected areas of face at bedtime on T,Th,Sat   Topical steroids (such as triamcinolone, fluocinolone, fluocinonide, mometasone, clobetasol, halobetasol, betamethasone, hydrocortisone ) can cause thinning and lightening of the skin if they are used for too long in the same area. Your physician has selected the right strength medicine for your problem and area affected on the body. Please  use your medication only as directed by your physician to prevent side effects.    TINEA PEDIS Exam: Scaling and maceration web spaces and over  distal and lateral soles. Chronic and persistent condition with duration or expected duration over one year. Condition is symptomatic / bothersome to patient. Not to goal. Treatment Plan: Continue Ketoconazole  2% cream to feet every night until clear.    SEBORRHEIC DERMATITIS   Related Medications ketoconazole  (NIZORAL ) 2 % shampoo apply three times per week, massage into scalp and leave in for a few minutes before rinsing out hydrocortisone  2.5 % cream Apply topically at bedtime. Apply to face and scalp use on T, Th, Sat NEOPLASM OF SKIN left superior medial scapula Epidermal / dermal shaving  Lesion diameter (cm):  0.6 Informed consent: discussed and consent obtained   Timeout: patient name, date of birth, surgical site, and procedure verified   Procedure prep:  Patient was prepped and draped in usual sterile fashion Prep type:  Isopropyl alcohol Anesthesia: the lesion was anesthetized in a standard fashion   Anesthetic:  1% lidocaine  w/ epinephrine 1-100,000 buffered w/ 8.4% NaHCO3 Hemostasis achieved with: pressure, aluminum chloride and electrodesiccation   Outcome: patient tolerated procedure well   Post-procedure details: sterile dressing applied and wound care instructions given   Dressing type: bandage and petrolatum    Specimen 1 - Surgical pathology Differential Diagnosis: R/O Dysplastic nevus   Check Margins: No   Return in about 1 year (around 02/10/2025) for TBSE .  IFay Kirks, CMA, am acting as scribe for Alm Rhyme, MD .   Documentation: I have reviewed the above documentation for accuracy and completeness, and I agree with the above.  Alm Rhyme, MD

## 2024-02-13 ENCOUNTER — Encounter: Payer: Self-pay | Admitting: Dermatology

## 2024-02-14 ENCOUNTER — Ambulatory Visit: Payer: BLUE CROSS/BLUE SHIELD | Admitting: Dermatology

## 2024-02-14 LAB — SURGICAL PATHOLOGY

## 2024-02-18 ENCOUNTER — Ambulatory Visit: Payer: Self-pay | Admitting: Dermatology

## 2024-02-19 ENCOUNTER — Encounter: Payer: Self-pay | Admitting: Dermatology

## 2024-02-19 NOTE — Telephone Encounter (Signed)
-----   Message from Alm Rhyme sent at 02/18/2024  2:56 PM EST ----- FINAL DIAGNOSIS        1. Skin, left superior medial scapula :       DYSPLASTIC COMPOUND NEVUS WITH MODERATE ATYPIA, DEEP MARGIN INVOLVED    Moderate Dysplastic Recheck next visit ----- Message ----- From: Interface, Lab In Three Zero One Sent: 02/14/2024   5:43 PM EST To: Alm JAYSON Rhyme, MD

## 2024-02-19 NOTE — Telephone Encounter (Signed)
 Advised pt of bx results/sh ?

## 2024-02-22 NOTE — Progress Notes (Signed)
 [facility] Telephone: [phone] Fax: [phone] Website: [email or web address]  [facility] Telehealth Video Encounter Note   Records in Epic, care everywhere and outside records were reviewed, which were summarized in my note as well.  Manus Weedman MRN: [id number] Visit Date: 02/22/2024 DOB: [birth year] Age: 68 y.o. APP: RENELDA NAT BANDY, NP   Primary Care Provider Information:   Primary Care Provider:  Glover Alm Hail, MD [phone] [address] [place] [address] Phone: [phone] Fax: [phone]    Subjective:    Bard Haupert is an 68 y.o. male who was referred by Delice, NP, his PCP for evaluation of an elevated PSA.   Interval Hx: 10/25/2023 PSA 5.1  02/15/2024 Prostate MRI shows a prostate volume 86.48ml with BPH. No index lesions.  No change in urinary symptoms. Makes very little urine, on dialysis.  HPI: He no family hx of prostate cancer. Father possibly had pancreatic cancer. No fhx of breast or colon cancer.  His IPSS is 8/35. He has no prior genitourinary history of hematuria, hematospermia, UTI, previous GU surgery.  He began PSA screening at [facility] in 2016 and it has been checked yearly. He has not had a prior prostate biopsy .  His PSA history is as seen below. He presents for further evaluation and management after prostate MRI.  He had a prostate MRI in 09/13/2022 that showed no lesions but a prostate volume of 65.27ml.  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    07/13/2022    1:04 PM  AUA Symptoms Score  Incomplete emptying: Over the past month, how often have you had a sensation of not emptying your bladder completely after you finished urinating? 0  Frequency: Over the past month, how often have you had to urinate again less than 2 hours after you finished urinating? 3  Intermittency: Over the past month, how often have you found that you stopped  and started again several times when you urinated? 0  Urgency: Over the past month, how often have you found it difficult to postpone urination? 0  Weak Stream: Over the past month, how often have you had a weak stream? 3  Straining: Over the past month, how often have you had to push or strain to begin urination? 0  Nocturia: Over the past month or so, how many times did you get up to urinate from the time you went to bed until the time you got up in the morning? 2  Quality of Life Due to Urinary Symptoms: If you were to spend the rest of your life with your urinary condition just the way it is now, how would you feel about that? Mixed  Total AUA 8    Past Medical History:  has a past medical history of CAD (coronary artery disease), Chest pain (07/03/2020), Depression, Diverticulosis (05/19/2014), Dyspnea (07/03/2020), Hepatitis, History of blood transfusion, History of myocardial infarction, Hyperlipidemia, Hypertension, Nephrolithiasis (most recently 1999), PE (pulmonary thromboembolism) (CMS/HHS-HCC), Septic shock (CMS/HHS-HCC) (07/08/2020), Sleep apnea, Steroid-induced hyperglycemia, and Tubular adenoma of colon (05/19/2014). Family History: family history includes Alzheimer's disease in his sister; Colon polyps in his father; Coronary Artery Disease (Blocked arteries around heart) in his mother; Diabetes in his mother; High blood pressure (Hypertension) in his mother; No Known Problems in his brother, daughter, and son; Pancreatic cancer (age of onset: 20) in his father. Current Medications: has a current medication list which includes the following prescription(s): acetaminophen , amlodipine, atorvastatin , clopidogrel , docusate,  eszopiclone, famotidine , ferrous sulfate , fluticasone  propionate, hydroxyzine  pamoate, loratadine, lorazepam, midodrine , mirtazapine , pantoprazole , potassium chloride , sertraline, and tacrolimus .  Previous PSA values are: No results found for: PSA PSA (Prostate  Specific Antigen), Total  Date Value Ref Range Status  10/05/2023 6.27 (H) <=2.99 ng/mL Final    Comment:    [facility] PSA Screening algorithm, based on a multi-disciplinary consensus panel review of best reported practice in the literature. All recommendations and treatment decisions should be made in conjunction with the patient after discussion and counseling.  If PSA >= 3.0 ng/ml, consider referral to Urology If PSA <  3.0 ng/ml, consider screening every two years  Access [employer] Total-PSA Method:  The measured value of this analyte can vary depending upon the testing procedure used. Values determined on patient samples by differing testing procedures cannot be directly compared with one another, and could be cause of erroneous medical interpretation.  06/30/2022 4.66 (H) <=2.99 ng/mL Final    Comment:    [facility] PSA Screening algorithm, based on a multi-disciplinary consensus panel review of best reported practice in the literature. All recommendations and treatment decisions should be made in conjunction with the patient after discussion and counseling.  If PSA >= 3.0 ng/ml, consider referral to Urology If PSA <  3.0 ng/ml, consider screening every two years  Access [employer] Total-PSA Method:  The measured value of this analyte can vary depending upon the testing procedure used. Values determined on patient samples by differing testing procedures cannot be directly compared with one another, and could be cause of erroneous medical interpretation.  12/16/2021 3.07 (H) <=2.99 ng/mL Final    Comment:    [facility] PSA Screening algorithm, based on a multi-disciplinary consensus panel review of best reported practice in the literature. All recommendations and treatment decisions should be made in conjunction with the patient after discussion and counseling.  If PSA >= 3.0 ng/ml, consider referral to Urology If PSA <  3.0 ng/ml, consider  screening every two years  Access [employer] Total-PSA Method:  The measured value of this analyte can vary depending upon the testing procedure used. Values determined on patient samples by differing testing procedures cannot be directly compared with one another, and could be cause of erroneous medical interpretation.  Results for orders placed during the hospital encounter of 02/15/24  MRI 3D REFORMATION INDEPENDENT WORKSTATION  Narrative Study: MRI PROSTATE WITH AND WITHOUT CONTRAST, MRI 3D REFORMATION INDEPENDENT WORKSTATION  Indication:  Prostate cancer suspected, no prior biopsy, R97.20 Elevated prostate specific antigen (PSA)  Comparison:  09/12/2022  Technique:  Multiplanar, multisequence MR imaging was performed of the pelvis using the Prostate protocol.  Endorectal coil: No endorectal coil was utilized.  IV contrast:  IV contrast was administered to improve disease detection and further define anatomy. In addition to multiplanar MR of the pelvis, additional 3D postprocessing and image rendering was performed on a separate workstation, including perfusion analysis.  This was performed to improve sensitivity for detection of prostate cancer and for pretreatment planning.  Findings:  Prostate gland measurements and PSA density: *Measures: 5.9 x 4.5 x 5.6 cm *Volume:  86.7 mL *PSA: 5.1 ng/mLon 10/29/2023 *PSA Density: 0.06 ng/mL^2  Peripheral zone: Striated T2 hyperintense signal likely sequela of prior prostatitis. No index lesion.  Central gland: BPH nodules. No index lesion.  Prostatic capsule: Normal  Neurovascular bundles: Normal  Seminal vesicles: Normal  Lymph nodes: No suspicious lymphadenopathy.  Bones: No suspicious osseous lesions.  Extraprostatic findings: Trace free fluid with  partially visualized peritoneal dialysis catheter.  Impression: No index lesion.  Electronically Reviewed by:  Gatha Daria Herb, MD, [facility] Electronically  Reviewed on:  02/15/2024 1:40 PM  I have reviewed the images and concur with the above findings.  Electronically Signed by:  Toya Irving, MD, [facility] Electronically Signed on:  02/15/2024 2:04 PM      REVIEW OF SYSTEMS:  Pertinent positives per HPI.  All other systems negative and complete   Objective:   Physical Exam: Not possible due to telehealth nature of today's visit. The patient sounded alert and oriented X3 during video and audio discussion, and did not appear to be in any acute distress during the video visit.   Assessment & Plan:   Khyrin Trevathan is a 68 y.o. with elevated PSA.  Reviewed prostate MRI. Prostate MRI shows a prostate volume 86.14ml with BPH. No index lesions.  He has an enlarged prostate, and is not symptomatic.  Recommend repeat PSA in 1 year from last with PCP   He agrees with the plan and verbalized understanding. All questions answered  Attestation Statement:   I personally performed the service, non-incident to. (WP)   CHAMIA NAT BANDY, NP

## 2024-03-07 ENCOUNTER — Other Ambulatory Visit: Payer: Self-pay

## 2024-03-07 ENCOUNTER — Emergency Department

## 2024-03-07 ENCOUNTER — Emergency Department
Admission: EM | Admit: 2024-03-07 | Discharge: 2024-03-07 | Disposition: A | Discharge status: Home / Self Care | Attending: Emergency Medicine | Admitting: Emergency Medicine

## 2024-03-07 DIAGNOSIS — N186 End stage renal disease: Secondary | ICD-10-CM | POA: Insufficient documentation

## 2024-03-07 DIAGNOSIS — N201 Calculus of ureter: Secondary | ICD-10-CM

## 2024-03-07 DIAGNOSIS — I2510 Atherosclerotic heart disease of native coronary artery without angina pectoris: Secondary | ICD-10-CM | POA: Diagnosis not present

## 2024-03-07 DIAGNOSIS — N132 Hydronephrosis with renal and ureteral calculous obstruction: Secondary | ICD-10-CM | POA: Insufficient documentation

## 2024-03-07 DIAGNOSIS — R1031 Right lower quadrant pain: Secondary | ICD-10-CM | POA: Diagnosis present

## 2024-03-07 DIAGNOSIS — Z992 Dependence on renal dialysis: Secondary | ICD-10-CM | POA: Diagnosis not present

## 2024-03-07 DIAGNOSIS — E1122 Type 2 diabetes mellitus with diabetic chronic kidney disease: Secondary | ICD-10-CM | POA: Insufficient documentation

## 2024-03-07 LAB — COMPREHENSIVE METABOLIC PANEL WITH GFR
ALT: 8 U/L (ref 0–44)
AST: 23 U/L (ref 15–41)
Albumin: 4 g/dL (ref 3.5–5.0)
Alkaline Phosphatase: 111 U/L (ref 38–126)
Anion gap: 20 — ABNORMAL HIGH (ref 5–15)
BUN: 34 mg/dL — ABNORMAL HIGH (ref 8–23)
CO2: 16 mmol/L — ABNORMAL LOW (ref 22–32)
Calcium: 8.3 mg/dL — ABNORMAL LOW (ref 8.9–10.3)
Chloride: 104 mmol/L (ref 98–111)
Creatinine, Ser: 3.6 mg/dL — ABNORMAL HIGH (ref 0.61–1.24)
GFR, Estimated: 17 mL/min — ABNORMAL LOW (ref >60)
Glucose, Bld: 143 mg/dL — ABNORMAL HIGH (ref 70–99)
Potassium: 4.3 mmol/L (ref 3.5–5.1)
Sodium: 140 mmol/L (ref 135–145)
Total Bilirubin: 0.7 mg/dL (ref 0.0–1.2)
Total Protein: 6.7 g/dL (ref 6.5–8.1)

## 2024-03-07 LAB — BASIC METABOLIC PANEL WITH GFR
Anion gap: 13 (ref 5–15)
BUN: 32 mg/dL — ABNORMAL HIGH (ref 8–23)
CO2: 16 mmol/L — ABNORMAL LOW (ref 22–32)
Calcium: 6.6 mg/dL — ABNORMAL LOW (ref 8.9–10.3)
Chloride: 110 mmol/L (ref 98–111)
Creatinine, Ser: 3.23 mg/dL — ABNORMAL HIGH (ref 0.61–1.24)
GFR, Estimated: 20 mL/min — ABNORMAL LOW (ref >60)
Glucose, Bld: 104 mg/dL — ABNORMAL HIGH (ref 70–99)
Potassium: 3.4 mmol/L — ABNORMAL LOW (ref 3.5–5.1)
Sodium: 138 mmol/L (ref 135–145)

## 2024-03-07 LAB — CBC
HCT: 32.2 % — ABNORMAL LOW (ref 39.0–52.0)
Hemoglobin: 11.5 g/dL — ABNORMAL LOW (ref 13.0–17.0)
MCH: 32.2 pg (ref 26.0–34.0)
MCHC: 35.7 g/dL (ref 30.0–36.0)
MCV: 90.2 fL (ref 80.0–100.0)
Platelets: 179 K/uL (ref 150–400)
RBC: 3.57 MIL/uL — ABNORMAL LOW (ref 4.22–5.81)
RDW: 13.1 % (ref 11.5–15.5)
WBC: 10.2 K/uL (ref 4.0–10.5)
nRBC: 0 % (ref 0.0–0.2)

## 2024-03-07 LAB — LIPASE, BLOOD: Lipase: 35 U/L (ref 11–51)

## 2024-03-07 MED ORDER — SODIUM CHLORIDE 0.9 % IV BOLUS
500.0000 mL | Freq: Once | INTRAVENOUS | Status: AC
Start: 2024-03-07 — End: 2024-03-07
  Administered 2024-03-07: 500 mL via INTRAVENOUS

## 2024-03-07 MED ORDER — HYDROMORPHONE HCL 1 MG/ML IJ SOLN
1.0000 mg | Freq: Once | INTRAMUSCULAR | Status: AC
  Administered 2024-03-07: 1 mg via INTRAVENOUS
  Filled 2024-03-07: qty 1

## 2024-03-07 MED ORDER — OXYCODONE-ACETAMINOPHEN 5-325 MG PO TABS: 1.0000 | ORAL_TABLET | ORAL | 0 refills | Status: AC | PRN

## 2024-03-07 MED ORDER — ONDANSETRON HCL 4 MG/2ML IJ SOLN
4.0000 mg | Freq: Once | INTRAMUSCULAR | Status: AC
Start: 2024-03-07 — End: 2024-03-07
  Administered 2024-03-07: 4 mg via INTRAVENOUS
  Filled 2024-03-07: qty 2

## 2024-03-07 MED ORDER — ONDANSETRON HCL 4 MG/2ML IJ SOLN
4.0000 mg | Freq: Once | INTRAMUSCULAR | Status: AC
  Administered 2024-03-07: 4 mg via INTRAVENOUS
  Filled 2024-03-07: qty 2

## 2024-03-07 NOTE — ED Provider Notes (Signed)
 [facility] Provider Note    Event Date/Time   First MD Initiated Contact with Patient 03/07/24 1819     (approximate)   History   Abdominal Pain   HPI  Alex Hampton is a 68 y.o. male with a history of liver transplant, hyperlipidemia, diabetes, CAD, ESRD on peritoneal dialysis, and BPH who presents with right lower quadrant pain, acute onset about 4 hours ago, severe intensity, nonradiating, no associated with a couple of episodes of vomiting.  The patient states he has had diarrhea for the last few days, although his last bowel movement was yesterday.  He denies any fever or chills.  He states that a peritoneal dialysis catheter was placed in the area about a month ago although he has not had any problems with it.  He states he feels somewhat similar to an episode of pain he was seen here for in 2024.  I reviewed the past medical records.  The patient had a peritoneal dialysis catheter placed at [facility] on 10/22.  The patient was seen in the ED on 12/11/2022 for acute onset of nausea and vomiting and was diagnosed with a possible UTI.   Physical Exam   Triage Vital Signs: ED Triage Vitals  Encounter Vitals Group     BP 03/07/24 1822 (!) 180/85     Girls Systolic BP Percentile --      Girls Diastolic BP Percentile --      Boys Systolic BP Percentile --      Boys Diastolic BP Percentile --      Pulse Rate 03/07/24 1822 68     Resp 03/07/24 1822 20     Temp 03/07/24 1822 97.6 F (36.4 C)     Temp Source 03/07/24 1822 Oral     SpO2 03/07/24 1822 100 %     Weight --      Height --      Head Circumference --      Peak Flow --      Pain Score 03/07/24 1813 10     Pain Loc --      Pain Education --      Exclude from Growth Chart --     Most recent vital signs: Vitals:   03/07/24 2307 03/07/24 2318  BP:    Pulse: 66   Resp:  16  Temp:    SpO2: 100% 98%     General: Alert, uncomfortable appearing, no distress.  CV:  Good peripheral perfusion.   Resp:  Normal effort.  Abd:  Right lower quadrant tenderness.  PD catheter in place with no surrounding erythema, skin lesions, or abnormal warmth.  No distention.  Other:  No jaundice or scleral icterus.   ED Results / Procedures / Treatments   Labs (all labs ordered are listed, but only abnormal results are displayed) Labs Reviewed  COMPREHENSIVE METABOLIC PANEL WITH GFR - Abnormal; Notable for the following components:      Result Value   CO2 16 (*)    Glucose, Bld 143 (*)    BUN 34 (*)    Creatinine, Ser 3.60 (*)    Calcium  8.3 (*)    GFR, Estimated 17 (*)    Anion gap 20 (*)    All other components within normal limits  CBC - Abnormal; Notable for the following components:   RBC 3.57 (*)    Hemoglobin 11.5 (*)    HCT 32.2 (*)    All other components within normal limits  BASIC METABOLIC PANEL WITH GFR - Abnormal; Notable for the following components:   Potassium 3.4 (*)    CO2 16 (*)    Glucose, Bld 104 (*)    BUN 32 (*)    Creatinine, Ser 3.23 (*)    Calcium  6.6 (*)    GFR, Estimated 20 (*)    All other components within normal limits  LIPASE, BLOOD  URINALYSIS, ROUTINE W REFLEX MICROSCOPIC     EKG  ED ECG REPORT I, Waylon Cassis, the attending physician, personally viewed and interpreted this ECG.  Date: 03/07/2024 EKG Time: 1931 Rate: D5 Rhythm: normal sinus rhythm QRS Axis: normal Intervals: normal ST/T Wave abnormalities: normal Narrative Interpretation: no evidence of acute ischemia    RADIOLOGY  CT abdomen/pelvis: I independently viewed and interpreted the images; there are no dilated bowel loops or any free air or free fluid.  Radiology report indicates the following:  IMPRESSION:  1. Mild right hydroureteronephrosis upstream from a 12 mm stone at the right  UVJ.    PROCEDURES:  Critical Care performed: No  Procedures   MEDICATIONS ORDERED IN ED: Medications  HYDROmorphone  (DILAUDID ) injection 1 mg (1 mg Intravenous Given  03/07/24 1924)  ondansetron  (ZOFRAN ) injection 4 mg (4 mg Intravenous Given 03/07/24 1924)  HYDROmorphone  (DILAUDID ) injection 1 mg (1 mg Intravenous Given 03/07/24 2016)  ondansetron  (ZOFRAN ) injection 4 mg (4 mg Intravenous Given 03/07/24 2015)  sodium chloride  0.9 % bolus 500 mL (0 mLs Intravenous Stopped 03/07/24 2154)     IMPRESSION / MDM / ASSESSMENT AND PLAN / ED COURSE  I reviewed the triage vital signs and the nursing notes.  68 year old male with PMH as noted above presents with acute onset of severe right lower quadrant pain associated with couple episodes of vomiting.  He has also had some recent diarrhea.  On exam he is tender in the right lower quadrant.  Vital signs are normal except for hypertension.  Differential diagnosis includes, but is not limited to, colitis, diverticulitis, appendicitis, UTI, pyelonephritis, PD catheter complication, bowel obstruction, volvulus.  We will obtain lab workup, CT abdomen/pelvis, give analgesia, and reassess.  Patient's presentation is most consistent with acute presentation with potential threat to life or bodily function.  The patient is on the cardiac monitor to evaluate for evidence of arrhythmia and/or significant heart rate changes.  ----------------------------------------- 11:04 PM on 03/07/2024 -----------------------------------------  CT shows a small ureteral stone at the right UVJ.  This is consistent with the patient's pain.  He is now pain-free.  I suspect that he may have already passed a stone to his bladder.  CBC showed no acute findings.  CMP showed elevated creatinine consistent with baseline.  Bicarb is low and the anion gap is slightly elevated.  This may be due to dehydration or vomiting earlier.  We gave a fluid bolus.  Repeat BMP demonstrates that the anion gap is normalized.  There are couple of other abnormalities on the repeat such as a low calcium , although this may be dilutional since several of the electrolyte  values are lower than prior.  This does not appear to be clinically significant.  The patient appears comfortable.  He feels well and is eager to go home.  At this time, given the resolved anion gap after fluids, the resolved pain, and the CT findings, he is stable for discharge.  I counseled him on the results of the workup and plan of care.  I gave strict return precautions, and he expressed understanding  FINAL CLINICAL  IMPRESSION(S) / ED DIAGNOSES   Final diagnoses:  Ureteral stone     Rx / DC Orders   ED Discharge Orders          Ordered    oxyCODONE -acetaminophen  (PERCOCET) 5-325 MG tablet  Every 4 hours PRN        03/07/24 2304             Note:  This document was prepared using Dragon voice recognition software and may include unintentional dictation errors.    Jacolyn Pae, MD 03/07/24 2358

## 2024-03-07 NOTE — Discharge Instructions (Signed)
 Your CT scan shows a small kidney stone which should pass on its own.  You may take the Percocet as needed for pain.  Follow-up with your primary care provider.  Return to the ER for new, worsening, or persistent severe pain, nausea or vomiting, fever, difficulty urinating, or any other new or worsening symptoms that concern you.

## 2024-03-07 NOTE — ED Triage Notes (Signed)
 Pt comes via EMS with c/o RLQ pain that started few hours ago. Pt did had dialysis shunt placed and it is right at same area. Pt states dull to sharp pain. Pt states nausea and vomiting.   200/94 CBG 132 HR 69 100 % RA

## 2024-03-13 ENCOUNTER — Telehealth (INDEPENDENT_AMBULATORY_CARE_PROVIDER_SITE_OTHER): Payer: Self-pay

## 2024-03-13 NOTE — Telephone Encounter (Signed)
 Spoke with the patient and he is scheduled with Dr. Marea on 03/20/24 with a 2:00 pm arrival time to the [facility] for a permcath removal. Pre-procedure instructions were discussed and will be mailed.

## 2024-03-20 ENCOUNTER — Ambulatory Visit
Admission: RE | Admit: 2024-03-20 | Discharge: 2024-03-20 | Disposition: A | Attending: Vascular Surgery | Admitting: Vascular Surgery

## 2024-03-20 ENCOUNTER — Encounter: Admission: RE | Disposition: A | Payer: Self-pay | Attending: Vascular Surgery

## 2024-03-20 DIAGNOSIS — Z452 Encounter for adjustment and management of vascular access device: Secondary | ICD-10-CM

## 2024-03-20 DIAGNOSIS — I120 Hypertensive chronic kidney disease with stage 5 chronic kidney disease or end stage renal disease: Secondary | ICD-10-CM | POA: Diagnosis not present

## 2024-03-20 DIAGNOSIS — Z992 Dependence on renal dialysis: Secondary | ICD-10-CM | POA: Diagnosis not present

## 2024-03-20 DIAGNOSIS — N186 End stage renal disease: Secondary | ICD-10-CM | POA: Diagnosis not present

## 2024-03-20 DIAGNOSIS — Z4901 Encounter for fitting and adjustment of extracorporeal dialysis catheter: Secondary | ICD-10-CM | POA: Diagnosis present

## 2024-03-20 HISTORY — PX: DIALYSIS/PERMA CATHETER REMOVAL: CATH118289

## 2024-03-20 SURGERY — DIALYSIS/PERMA CATHETER REMOVAL
Anesthesia: LOCAL

## 2024-03-20 MED ORDER — LIDOCAINE-EPINEPHRINE (PF) 1 %-1:200000 IJ SOLN
INTRAMUSCULAR | Status: DC | PRN
  Administered 2024-03-20: 20 mL via INTRADERMAL

## 2024-03-20 SURGICAL SUPPLY — 3 items
CHLORAPREP W/TINT 26 (MISCELLANEOUS) IMPLANT
FORCEPS FG STRG 5.5XNS LF DISP (INSTRUMENTS) IMPLANT
TRAY LACERATION DISP STRL (SET/KITS/TRAYS/PACK) IMPLANT

## 2024-03-20 NOTE — H&P (Signed)
 [facility] Admission History & Physical  MRN : [id number]  Alex Hampton is a 68 y.o. (1956/01/20) male who presents with chief complaint of No chief complaint on file. SABRA  History of Present Illness: I am asked to evaluate the patient by the dialysis center. The patient was sent here because they have a nonfunctioning tunneled catheter and a functioning permanent access.  The patient reports they're not been any problems with any of their dialysis runs. They are reporting good flows with good parameters at dialysis.   Patient denies pain or tenderness overlying the access.  There is no pain with dialysis.  The patient denies hand pain or finger pain consistent with steal syndrome.  No fevers or chills while on dialysis.    No current facility-administered medications for this encounter.    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   left superior medial scapula, moderate atypia   History of cardiac cath    History of heart artery stent    Hyperlipidemia    Hypertension    MI (myocardial infarction) [facility])    Renal disorder     Past Surgical History:  Procedure Laterality Date   IR GJ TUBE CHANGE  11/22/2020   IR GJ TUBE CHANGE  11/26/2020   IR REPLC GASTRO/COLONIC TUBE PERCUT W/FLUORO  10/14/2020   LEFT HEART CATH AND CORONARY ANGIOGRAPHY N/A 08/19/2020   Procedure: LEFT HEART CATH AND CORONARY ANGIOGRAPHY;  Surgeon: Hester Wolm PARAS, MD;  Location: [facility] INVASIVE CV LAB;  Service: Cardiovascular;  Laterality: N/A;   LIVER TRANSPLANT      Social History Social History[1]  Family History Family History  Problem Relation Age of Onset   Diabetes Mellitus II Mother    Pancreatic cancer Father     No family history of bleeding or clotting disorders, autoimmune disease or porphyria  Allergies[2]   REVIEW OF SYSTEMS (Negative unless checked)  Constitutional: [] Weight loss  [] Fever  [] Chills Cardiac: [] Chest pain    [] Chest pressure   [] Palpitations   [] Shortness of breath when laying flat   [] Shortness of breath at rest   [x] Shortness of breath with exertion. Vascular:  [] Pain in legs with walking   [] Pain in legs at rest   [] Pain in legs when laying flat   [] Claudication   [] Pain in feet when walking  [] Pain in feet at rest  [] Pain in feet when laying flat   [] History of DVT   [] Phlebitis   [] Swelling in legs   [] Varicose veins   [] Non-healing ulcers Pulmonary:   [] Uses home oxygen   [] Productive cough   [] Hemoptysis   [] Wheeze  [] COPD   [] Asthma Neurologic:  [] Dizziness  [] Blackouts   [] Seizures   [] History of stroke   [] History of TIA  [] Aphasia   [] Temporary blindness   [] Dysphagia   [] Weakness or numbness in arms   [] Weakness or numbness in legs Musculoskeletal:  [] Arthritis   [] Joint swelling   [] Joint pain   [] Low back pain Hematologic:  [] Easy bruising  [] Easy bleeding   [] Hypercoagulable state   [] Anemic  [] Hepatitis Gastrointestinal:  [] Blood in stool   [] Vomiting blood  [] Gastroesophageal reflux/heartburn   [] Difficulty swallowing. Genitourinary:  [x] Chronic kidney disease   [] Difficult urination  [] Frequent urination  [] Burning with urination   [] Blood in urine Skin:  [] Rashes   [] Ulcers   [] Wounds Psychological:  [] History of anxiety   []  History of major depression.  Physical Examination  There were no vitals filed for this visit. There is no height or weight on file to calculate BMI. Gen: WD/WN, NAD Head: [place]/AT, No temporalis wasting.  Ear/Nose/Throat: Hearing grossly intact, nares w/o erythema or drainage, oropharynx w/o Erythema/Exudate,  Eyes: Conjunctiva clear, sclera non-icteric Neck: Trachea midline.  No JVD.  Pulmonary:  Good air movement, respirations not labored, no use of accessory muscles.  Cardiac: RRR, normal S1, S2. Vascular:  Vessel Right Left  Radial Palpable Palpable   Musculoskeletal: M/S 5/5 throughout.  Extremities without ischemic changes.  No deformity or atrophy.   Neurologic: Sensation grossly intact in extremities.  Symmetrical.  Speech is fluent. Motor exam as listed above. Psychiatric: Judgment intact, Mood & affect appropriate for pt's clinical situation. Dermatologic: No rashes or ulcers noted.  No cellulitis or open wounds. Lymph : No Cervical, Axillary, or Inguinal lymphadenopathy.   CBC Lab Results  Component Value Date   WBC 10.2 03/07/2024   HGB 11.5 (L) 03/07/2024   HCT 32.2 (L) 03/07/2024   MCV 90.2 03/07/2024   PLT 179 03/07/2024    BMET    Component Value Date/Time   NA 138 03/07/2024 2218   K 3.4 (L) 03/07/2024 2218   CL 110 03/07/2024 2218   CO2 16 (L) 03/07/2024 2218   GLUCOSE 104 (H) 03/07/2024 2218   BUN 32 (H) 03/07/2024 2218   CREATININE 3.23 (H) 03/07/2024 2218   CALCIUM  6.6 (L) 03/07/2024 2218   GFRNONAA 20 (L) 03/07/2024 2218   GFRAA >60 12/10/2019 0802   CrCl cannot be calculated (Unknown ideal weight.).  COAG Lab Results  Component Value Date   INR 1.1 07/08/2022   INR 1.1 10/10/2020   INR 1.5 (H) 10/17/2019    Radiology CT ABDOMEN PELVIS WO CONTRAST Result Date: 03/07/2024 EXAM: CT ABDOMEN AND PELVIS WITHOUT CONTRAST 03/07/2024 08:16:27 PM TECHNIQUE: CT of the abdomen and pelvis was performed without the administration of intravenous contrast. Multiplanar reformatted images are provided for review. Automated exposure control, iterative reconstruction, and/or weight-based adjustment of the mA/kV was utilized to reduce the radiation dose to as low as reasonably achievable. COMPARISON: 12/11/2022 CLINICAL HISTORY: RLQ abdominal pain. FINDINGS: LOWER CHEST: Scarring in the lower lobes. LIVER: The liver is unremarkable. GALLBLADDER AND BILE DUCTS: Cholecystectomy. No biliary ductal dilatation. SPLEEN: No acute abnormality. PANCREAS: No acute abnormality. ADRENAL GLANDS: No acute abnormality. KIDNEYS, URETERS AND BLADDER: Mild right hydroureteronephrosis upstream from a 1-2 mm stone at the right UVJ. No stones  in the left kidney or ureter. No left hydronephrosis. No perinephric or periureteral stranding. Urinary bladder is unremarkable. GI AND BOWEL: Stomach demonstrates no acute abnormality. There is no bowel obstruction. PERITONEUM AND RETROPERITONEUM: Small amount of free fluid in the pelvis likely related to dialysis. No free air. VASCULATURE: Aorta is normal in caliber. Aortic atherosclerotic calcification. LYMPH NODES: No lymphadenopathy. REPRODUCTIVE ORGANS: No acute abnormality. BONES AND SOFT TISSUES: Peritoneal dialysis catheter with tip in the central pelvis. No acute osseous abnormality. No focal soft tissue abnormality. IMPRESSION: 1. Mild right hydroureteronephrosis upstream from a 12 mm stone at the right UVJ. Electronically signed by: Norman Gatlin MD 03/07/2024 08:26 PM EST RP Workstation: [id number]    Assessment/Plan 1.  Complication dialysis device:  Patient's Tunneled catheter is not being used. The patient has an extremity access that is functioning well. Therefore, the patient will undergo removal of the tunneled catheter under local anesthesia.  The risks and benefits were described to the patient.  All questions were answered.  The patient agrees to  proceed with angiography and intervention. Potassium will be drawn to ensure that it is an appropriate level prior to performing intervention. 2.  End-stage renal disease requiring hemodialysis:  Patient will continue dialysis therapy without further interruption  3.  Hypertension:  Patient will continue medical management; nephrology is following no changes in oral medications.     Selinda Gu, MD  03/20/2024 2:08 PM     [1]  Social History Tobacco Use   Smoking status: Former   Smokeless tobacco: Never  Substance Use Topics   Alcohol use: Yes    Comment: occasionally   Drug use: Never  [2] No Known Allergies

## 2024-03-20 NOTE — Discharge Instructions (Signed)
 Tunneled Catheter Removal, Care After Refer to this sheet in the next few weeks. These instructions provide you with information about caring for yourself after your procedure. Your health care provider may also give you more specific instructions. Your treatment has been planned according to current medical practices, but problems sometimes occur. Call your health care provider if you have any problems or questions after your procedure. What can I expect after the procedure? After the procedure, it is common to have: Some mild redness, swelling, and pain around your catheter site.   Follow these instructions at home: Incision care  Check your removal site  every day for signs of infection. Check for: More redness, swelling, or pain. More fluid or blood. Warmth. Pus or a bad smell. Remove your dressing in 48hrs leave open to air  Activity  Return to your normal activities as told by your health care provider. Ask your health care provider what activities are safe for you. Do not lift anything that is heavier than 10 lb (4.5 kg) for 3 days  You may shower tomorrow  Contact a health care provider if: You have more fluid or blood coming from your removal site You have more redness, swelling, or pain at your incisions or around the area where your catheter was removed Your removal site feel warm to the touch. You feel unusually weak. You feel nauseous.. Get help right away if You have swelling in your arm, shoulder, neck, or face. You develop chest pain. You have difficulty breathing. You feel dizzy or light-headed. You have pus or a bad smell coming from your removal site You have a fever. You develop bleeding from your removal site, and your bleeding does not stop. This information is not intended to replace advice given to you by your health care provider. Make sure you discuss any questions you have with your health care provider. Document Released: 03/13/2012 Document Revised:  11/28/2015 Document Reviewed: 12/21/2014 Elsevier Interactive Patient Education  2017 [employer].

## 2024-03-20 NOTE — Op Note (Signed)
 Operative Note     Preoperative diagnosis:   1. ESRD with functional permanent access  Postoperative diagnosis:  1. ESRD with functional permanent access  Procedure:  Removal of right jugular Permcath  Surgeon:  Selinda Gu, MD  Anesthesia:  Local  EBL:  Minimal  Indication for the Procedure:  The patient has a functional permanent dialysis access and no longer needs their permcath.  This can be removed.  Risks and benefits are discussed and informed consent is obtained.  Description of the Procedure:  The patient's right neck, chest and existing catheter were sterilely prepped and draped. The area around the catheter was anesthetized copiously with 1% lidocaine . The catheter was dissected out with curved hemostats until the cuff was freed from the surrounding fibrous sheath. The fiber sheath was transected, and the catheter was then removed in its entirety using gentle traction. Pressure was held and sterile dressings were placed. The patient tolerated the procedure well and was taken to the recovery room in stable condition.     Selinda Gu  03/20/2024, 2:50 PM This note was created with Dragon Medical transcription system. Any errors in dictation are purely unintentional.

## 2024-03-21 ENCOUNTER — Encounter: Payer: Self-pay | Admitting: Vascular Surgery

## 2025-02-10 ENCOUNTER — Ambulatory Visit: Admitting: Dermatology
